# Patient Record
Sex: Male | Born: 1980 | Race: White | Hispanic: No | Marital: Single | Smoking: Never smoker
Health system: Southern US, Community
[De-identification: ages and names within clinical notes are randomized; demographics above are authoritative.]

## PROBLEM LIST (undated history)

## (undated) DIAGNOSIS — F909 Attention-deficit hyperactivity disorder, unspecified type: Secondary | ICD-10-CM

## (undated) DIAGNOSIS — E119 Type 2 diabetes mellitus without complications: Secondary | ICD-10-CM

## (undated) DIAGNOSIS — I10 Essential (primary) hypertension: Secondary | ICD-10-CM

## (undated) DIAGNOSIS — F419 Anxiety disorder, unspecified: Secondary | ICD-10-CM

## (undated) DIAGNOSIS — E785 Hyperlipidemia, unspecified: Secondary | ICD-10-CM

## (undated) DIAGNOSIS — R011 Cardiac murmur, unspecified: Secondary | ICD-10-CM

## (undated) DIAGNOSIS — F32A Depression, unspecified: Secondary | ICD-10-CM

## (undated) DIAGNOSIS — I739 Peripheral vascular disease, unspecified: Secondary | ICD-10-CM

## (undated) DIAGNOSIS — F319 Bipolar disorder, unspecified: Secondary | ICD-10-CM

## (undated) HISTORY — DX: Type 2 diabetes mellitus without complications: E11.9

## (undated) HISTORY — PX: EYE SURGERY: SHX253

## (undated) HISTORY — DX: Essential (primary) hypertension: I10

## (undated) HISTORY — PX: WISDOM TOOTH EXTRACTION: SHX21

## (undated) NOTE — *Deleted (*Deleted)
13 year old male comes in because of fever and chills for the last 36 hours.  He has been seeing a podiatrist because of ulcers on his toes and they have been periodically debrided with x-rays failing to show evidence of osteomyelitis.  On exam, he is febrile and tachycardic with borderline blood pressure.  Code sepsis is activated.  The left first toe is markedly enlarged and erythematous with a deep ulcer present on the plantar surface.  Right first toe is mildly erythematous and swollen.

---

## 2002-03-08 ENCOUNTER — Ambulatory Visit: Admission: RE | Admit: 2002-03-08 | Discharge: 2002-03-08 | Payer: Self-pay | Admitting: Family Medicine

## 2015-04-30 ENCOUNTER — Ambulatory Visit (INDEPENDENT_AMBULATORY_CARE_PROVIDER_SITE_OTHER): Payer: Self-pay | Admitting: Physician Assistant

## 2015-04-30 VITALS — BP 144/104 | HR 82 | Temp 98.2°F | Resp 18 | Ht 75.0 in | Wt 390.6 lb

## 2015-04-30 DIAGNOSIS — G4700 Insomnia, unspecified: Secondary | ICD-10-CM

## 2015-04-30 DIAGNOSIS — H9311 Tinnitus, right ear: Secondary | ICD-10-CM

## 2015-04-30 DIAGNOSIS — I10 Essential (primary) hypertension: Secondary | ICD-10-CM

## 2015-04-30 LAB — POCT URINALYSIS DIP (MANUAL ENTRY)
BILIRUBIN UA: NEGATIVE
Blood, UA: NEGATIVE
LEUKOCYTES UA: NEGATIVE
Nitrite, UA: NEGATIVE
Protein Ur, POC: NEGATIVE
SPEC GRAV UA: 1.02
Urobilinogen, UA: 0.2
pH, UA: 5.5

## 2015-04-30 MED ORDER — TRAZODONE HCL 50 MG PO TABS: 50.0000 mg | ORAL_TABLET | Freq: Every day | ORAL | Status: DC

## 2015-04-30 MED ORDER — HYDROCHLOROTHIAZIDE 12.5 MG PO TABS: 12.5000 mg | ORAL_TABLET | Freq: Every day | ORAL | Status: DC

## 2015-04-30 MED ORDER — OXYMETAZOLINE HCL 0.05 % NA SOLN: 2.0000 | Freq: Two times a day (BID) | NASAL | Status: AC

## 2015-04-30 NOTE — Patient Instructions (Addendum)
Take the afrin no more than 3 days.  Please take medications as prescribed. I would like you to file disability with the social services disability.  You may fail with this attempt, but then you need to reapply.  If you have questions with this, you may contact us as well.     Please decrease your soda intake, and drink more water. If you are finding that you are staying up more at night, and demonstrating different behaviors, let us know. I would like to see you in 4-6 weeks for follow up.   Please check you blood pressure 3 times per week.  Please bring your blood pressure readings with you to the next visit.   You can call ahead to see if I am here.   Tinnitus Tinnitus refers to hearing a sound when there is no actual source for that sound. This is often described as ringing in the ears. However, people with this condition may hear a variety of noises. A person may hear the sound in one ear or in both ears.  The sounds of tinnitus can be soft, loud, or somewhere in between. Tinnitus can last for a few seconds or can be constant for days. It may go away without treatment and come back at various times. When tinnitus is constant or happens often, it can lead to other problems, such as trouble sleeping and trouble concentrating. Almost everyone experiences tinnitus at some point. Tinnitus that is long-lasting (chronic) or comes back often is a problem that may require medical attention.  CAUSES  The cause of tinnitus is often not known. In some cases, it can result from other problems or conditions, including:   Exposure to loud noises from machinery, music, or other sources.  Hearing loss.  Ear or sinus infections.  Earwax buildup.  A foreign object in the ear.  Use of certain medicines.  Use of alcohol and caffeine.  High blood pressure.  Heart diseases.  Anemia.  Allergies.  Meniere disease.  Thyroid problems.  Tumors.  An enlarged part of a weakened blood vessel  (aneurysm). SYMPTOMS The main symptom of tinnitus is hearing a sound when there is no source for that sound. It may sound like:   Buzzing.  Roaring.  Ringing.  Blowing air, similar to the sound heard when you listen to a seashell.  Hissing.  Whistling.  Sizzling.  Humming.  Running water.  A sustained musical note. DIAGNOSIS  Tinnitus is diagnosed based on your symptoms. Your health care provider will do a physical exam. A comprehensive hearing exam (audiologic exam) will be done if your tinnitus:   Affects only one ear (unilateral).  Causes hearing difficulties.  Lasts 6 months or longer. You may also need to see a health care provider who specializes in hearing disorders (audiologist). You may be asked to complete a questionnaire to determine the severity of your tinnitus. Tests may be done to help determine the cause and to rule out other conditions. These can include:  Imaging studies of your head and brain, such as:  A CT scan.  An MRI.  An imaging study of your blood vessels (angiogram). TREATMENT  Treating an underlying medical condition can sometimes make tinnitus go away. If your tinnitus continues, other treatments may include:  Medicines, such as certain antidepressants or sleeping aids.  Sound generators to mask the tinnitus. These include:  Tabletop sound machines that play relaxing sounds to help you fall asleep.  Wearable devices that fit in your ear  and play sounds or music.  A small device that uses headphones to deliver a signal embedded in music (acoustic neural stimulation). In time, this may change the pathways of your brain and make you less sensitive to tinnitus. This device is used for very severe cases when no other treatment is working.  Therapy and counseling to help you manage the stress of living with tinnitus.  Using hearing aids or cochlear implants, if your tinnitus is related to hearing loss. HOME CARE INSTRUCTIONS  When  possible, avoid being in loud places and being exposed to loud sounds.  Wear hearing protection, such as earplugs, when you are exposed to loud noises.  Do not take stimulants, such as nicotine, alcohol, or caffeine.  Practice techniques for reducing stress, such as meditation, yoga, or deep breathing.  Use a white noise machine, a humidifier, or other devices to mask the sound of tinnitus.  Sleep with your head slightly raised. This may reduce the impact of tinnitus.  Try to get plenty of rest each night. SEEK MEDICAL CARE IF:  You have tinnitus in just one ear.  Your tinnitus continues for 3 weeks or longer without stopping.  Home care measures are not helping.  You have tinnitus after a head injury.  You have tinnitus along with any of the following:  Dizziness.  Loss of balance.  Nausea and vomiting.   This information is not intended to replace advice given to you by your health care provider. Make sure you discuss any questions you have with your health care provider.   Document Released: 05/11/2005 Document Revised: 06/01/2014 Document Reviewed: 10/11/2013 Elsevier Interactive Patient Education 2016 Elsevier Inc. DASH Eating Plan DASH stands for "Dietary Approaches to Stop Hypertension." The DASH eating plan is a healthy eating plan that has been shown to reduce high blood pressure (hypertension). Additional health benefits may include reducing the risk of type 2 diabetes mellitus, heart disease, and stroke. The DASH eating plan may also help with weight loss. WHAT DO I NEED TO KNOW ABOUT THE DASH EATING PLAN? For the DASH eating plan, you will follow these general guidelines:  Choose foods with a percent daily value for sodium of less than 5% (as listed on the food label).  Use salt-free seasonings or herbs instead of table salt or sea salt.  Check with your health care provider or pharmacist before using salt substitutes.  Eat lower-sodium products, often  labeled as "lower sodium" or "no salt added."  Eat fresh foods.  Eat more vegetables, fruits, and low-fat dairy products.  Choose whole grains. Look for the word "whole" as the first word in the ingredient list.  Choose fish and skinless chicken or [place] more often than red meat. Limit fish, poultry, and meat to 6 oz (170 g) each day.  Limit sweets, desserts, sugars, and sugary drinks.  Choose heart-healthy fats.  Limit cheese to 1 oz (28 g) per day.  Eat more home-cooked food and less restaurant, buffet, and fast food.  Limit fried foods.  Cook foods using methods other than frying.  Limit canned vegetables. If you do use them, rinse them well to decrease the sodium.  When eating at a restaurant, ask that your food be prepared with less salt, or no salt if possible. WHAT FOODS CAN I EAT? Seek help from a dietitian for individual calorie needs. Grains Whole grain or whole wheat bread. Brown rice. Whole grain or whole wheat pasta. Quinoa, bulgur, and whole grain cereals. Low-sodium cereals. Corn or  whole wheat flour tortillas. Whole grain cornbread. Whole grain crackers. Low-sodium crackers. Vegetables Fresh or frozen vegetables (raw, steamed, roasted, or grilled). Low-sodium or reduced-sodium tomato and vegetable juices. Low-sodium or reduced-sodium tomato sauce and paste. Low-sodium or reduced-sodium canned vegetables.  Fruits All fresh, canned (in natural juice), or frozen fruits. Meat and Other Protein Products Ground beef (85% or leaner), grass-fed beef, or beef trimmed of fat. Skinless chicken or [place]. Ground chicken or [place]. Pork trimmed of fat. All fish and seafood. Eggs. Dried beans, peas, or lentils. Unsalted nuts and seeds. Unsalted canned beans. Dairy Low-fat dairy products, such as skim or 1% milk, 2% or reduced-fat cheeses, low-fat ricotta or cottage cheese, or plain low-fat yogurt. Low-sodium or reduced-sodium cheeses. Fats and Oils Tub margarines without  trans fats. Light or reduced-fat mayonnaise and salad dressings (reduced sodium). Avocado. Safflower, olive, or canola oils. Natural peanut or almond butter. Other Unsalted popcorn and pretzels. The items listed above may not be a complete list of recommended foods or beverages. Contact your dietitian for more options. WHAT FOODS ARE NOT RECOMMENDED? Grains White bread. White pasta. White rice. Refined cornbread. Bagels and croissants. Crackers that contain trans fat. Vegetables Creamed or fried vegetables. Vegetables in a cheese sauce. Regular canned vegetables. Regular canned tomato sauce and paste. Regular tomato and vegetable juices. Fruits Dried fruits. Canned fruit in light or heavy syrup. Fruit juice. Meat and Other Protein Products Fatty cuts of meat. Ribs, chicken wings, bacon, sausage, bologna, salami, chitterlings, fatback, hot dogs, bratwurst, and packaged luncheon meats. Salted nuts and seeds. Canned beans with salt. Dairy Whole or 2% milk, cream, half-and-half, and cream cheese. Whole-fat or sweetened yogurt. Full-fat cheeses or blue cheese. Nondairy creamers and whipped toppings. Processed cheese, cheese spreads, or cheese curds. Condiments Onion and garlic salt, seasoned salt, table salt, and sea salt. Canned and packaged gravies. Worcestershire sauce. Tartar sauce. Barbecue sauce. Teriyaki sauce. Soy sauce, including reduced sodium. Steak sauce. Fish sauce. Oyster sauce. Cocktail sauce. Horseradish. Ketchup and mustard. Meat flavorings and tenderizers. Bouillon cubes. Hot sauce. Tabasco sauce. Marinades. Taco seasonings. Relishes. Fats and Oils Butter, stick margarine, lard, shortening, ghee, and bacon fat. Coconut, palm kernel, or palm oils. Regular salad dressings. Other Pickles and olives. Salted popcorn and pretzels. The items listed above may not be a complete list of foods and beverages to avoid. Contact your dietitian for more information. WHERE CAN I FIND MORE  INFORMATION? National Heart, Lung, and Blood Institute: [employer]   This information is not intended to replace advice given to you by your health care provider. Make sure you discuss any questions you have with your health care provider.   Document Released: 04/30/2011 Document Revised: 06/01/2014 Document Reviewed: 03/15/2013 Elsevier Interactive Patient Education [employer]

## 2015-04-30 NOTE — Progress Notes (Signed)
[facility] [address], [place] [id number] [phone]  Date:  04/30/2015   Name:  [name] CheneyRussell C Waltz   DOB:  06/10/1980   MRN:  [id number]  PCP:  No primary care provider on file.    History of Present Illness:  [name] is a 34 y.o. male patient who [facility] for cc of tinnitus 3 weeks.  Tooth hurting back and top are sensitive.  Right ear tinnitus high pitched buzzing, barely sleep.  It is louder as the environment is quiet.  It is constant.  Crackling with opening mouth.  Ear wax removal helped along with allergy medication.  No fever. No dysequilibrium.    Positive depression screening.  Currently unemployed and without insurance.  He states his depression is worse than it has been.  He states he has thought of suicide in the past, but will never act on this.  He notes agitation as well.  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  He has been employed but can not complete it due to pressure of the workplace.  On his 2 last employments, he has quit due to becoming angry with his supervisor, and what appears like their ridicule.  He states that he hates to fail.   School was difficult.  Would attempt to disappear in crowds to avoid being ridiculed.  He states that he had difficulty with school, and attentiveness.  Though he would read fantastical books during school, without lack of focus.   He has tried paxil, but it would not control his depression.  He states he had the best satisfaction from prozac.  However there was a combination of possibly this and adderall which sent him into a somewhat manic state. Currently lives with family.  He has some friends.  Hx of hypertension but no longer on medication.  There are no active problems to display for this patient.   History reviewed. No pertinent past medical history.  History reviewed. No pertinent past surgical history.   Social History  Substance Use Topics  . Smoking status: Never  Smoker   . Smokeless tobacco: None  . Alcohol Use: No    History reviewed. No pertinent family history.  No Known Allergies  Medication list has been reviewed and updated.  No current outpatient prescriptions on file prior to visit.   No current facility-administered medications on file prior to visit.    ROS ROS otherwise unremarkable unless listed above.  Physical Examination: BP 145/105 mmHg  Pulse 82  Temp(Src) 98.2 F (36.8 C) (Oral)  Resp 18  Ht 6\' 3"  (1.905 m)  Wt 390 lb 9.6 oz (177.175 kg)  BMI 48.82 kg/m2  SpO2 99% Ideal Body Weight: Weight in (lb) to have BMI = 25: 199.6  Physical Exam  Constitutional: He is oriented to person, place, and time. He appears well-developed and well-nourished. No distress.  HENT:  Head: Normocephalic and atraumatic.  Right Ear: Tympanic membrane, external ear and ear canal normal.  Left Ear: Tympanic membrane, external ear and ear canal normal.  Nose: No mucosal edema or rhinorrhea.  Mouth/Throat: No oropharyngeal exudate, posterior oropharyngeal edema or posterior oropharyngeal erythema.  Ac>bc.  Without lateralization.  Eyes: Conjunctivae and EOM are normal. Pupils are equal, round, and reactive to light.  Cardiovascular: Normal rate and regular rhythm.  Exam reveals no friction rub.   No murmur heard. Pulmonary/Chest: Effort normal. No respiratory distress.  Neurological: He is alert and oriented to person, place,  and time.  Skin: Skin is warm and dry. He is not diaphoretic.  Psychiatric: He has a normal mood and affect. His behavior is normal.   Results for orders placed or performed in visit on 04/30/15  POCT urinalysis dipstick  Result Value Ref Range   Color, UA yellow yellow   Clarity, UA clear clear   Glucose, UA =500 (A) negative   Bilirubin, UA negative negative   Ketones, POC UA trace (5) (A) negative   Spec Grav, UA 1.020    Blood, UA negative negative   pH, UA 5.5    Protein Ur, POC negative negative    Urobilinogen, UA 0.2    Nitrite, UA Negative Negative   Leukocytes, UA Negative Negative     Assessment and Plan: [name] is a 34 y.o. male who is here today for tinnitus, depression, and here with high BP.   Treating with afrin to clear the ear passage.  Discussed firmly with adverse effect briefed, to only take the afrin for 3 days. Starting on trazadone at this time, to help with sleep with tinnitus, and depression.   Advised that he proceed with disability filing.  He may need ent specialist. Starting on hctz.  Advised that he hydrate with water well.  Given dash diet.    Essential hypertension - Plan: POCT urinalysis dipstick, hydrochlorothiazide (HYDRODIURIL) 12.5 MG tablet  Tinnitus, right - Plan: oxymetazoline (AFRIN) 0.05 % nasal spray, traZODone (DESYREL) 50 MG tablet  Insomnia - Plan: traZODone (DESYREL) 50 MG tablet   [name], PA-C [facility] 04/30/2015 11:16 AM  Addendum: Urine was placed upon his exit.  I have advised to contact patient for him to return for a1c and cmet.

## 2015-05-03 ENCOUNTER — Telehealth: Payer: Self-pay | Admitting: Physician Assistant

## 2015-05-03 ENCOUNTER — Telehealth: Payer: Self-pay

## 2015-05-03 DIAGNOSIS — R81 Glycosuria: Secondary | ICD-10-CM

## 2015-05-03 NOTE — Telephone Encounter (Signed)
Pt.notified

## 2015-05-03 NOTE — Telephone Encounter (Signed)
Unable to reach Pt. Left VM to call back 

## 2015-05-03 NOTE — Telephone Encounter (Signed)
Please alert the patient that his urine had glucose.  Is it possible that he can return for blood work.  This will be an a1c and a cmet.  There is concern that their is diabetes.  I would like to check this.  I will place a future order.  All he has to do is say he is here for blood work. Please also advise that he needs to go ahead and file for disability.

## 2016-06-04 ENCOUNTER — Ambulatory Visit (INDEPENDENT_AMBULATORY_CARE_PROVIDER_SITE_OTHER): Payer: Self-pay | Admitting: Emergency Medicine

## 2016-06-04 VITALS — BP 130/80 | HR 97 | Temp 98.3°F | Ht 75.25 in | Wt 370.2 lb

## 2016-06-04 DIAGNOSIS — R350 Frequency of micturition: Secondary | ICD-10-CM

## 2016-06-04 DIAGNOSIS — B999 Unspecified infectious disease: Secondary | ICD-10-CM

## 2016-06-04 DIAGNOSIS — N3000 Acute cystitis without hematuria: Secondary | ICD-10-CM

## 2016-06-04 DIAGNOSIS — R300 Dysuria: Secondary | ICD-10-CM

## 2016-06-04 DIAGNOSIS — R358 Other polyuria: Secondary | ICD-10-CM

## 2016-06-04 DIAGNOSIS — Z6841 Body Mass Index (BMI) 40.0 and over, adult: Secondary | ICD-10-CM

## 2016-06-04 LAB — POCT URINALYSIS DIP (MANUAL ENTRY)
Bilirubin, UA: NEGATIVE
Blood, UA: NEGATIVE
Glucose, UA: 500 — AB
Leukocytes, UA: NEGATIVE
Nitrite, UA: NEGATIVE
PH UA: 6.5
PROTEIN UA: NEGATIVE
SPEC GRAV UA: 1.01
Urobilinogen, UA: 0.2

## 2016-06-04 MED ORDER — CIPROFLOXACIN HCL 500 MG PO TABS: 500.0000 mg | ORAL_TABLET | Freq: Two times a day (BID) | ORAL | 0 refills | Status: AC

## 2016-06-04 NOTE — Patient Instructions (Addendum)
IF you received an x-ray today, you will receive an invoice from [facility]. Please contact [facility] at [phone] with questions or concerns regarding your invoice.   IF you received labwork today, you will receive an invoice from [facility]. Please contact [facility] at [phone] with questions or concerns regarding your invoice.   Our billing staff will not be able to assist you with questions regarding bills from these companies.  You will be contacted with the lab results as soon as they are available. The fastest way to get your results is to activate your My Chart account. Instructions are located on the last page of this paperwork. If you have not heard from [name] regarding the results in 2 weeks, please contact this office.         Urinary Tract Infection, Adult A urinary tract infection (UTI) is an infection of any part of the urinary tract, which includes the kidneys, ureters, bladder, and urethra. These organs make, store, and get rid of urine in the body. UTI can be a bladder infection (cystitis) or kidney infection (pyelonephritis). What are the causes? This infection may be caused by fungi, viruses, or bacteria. Bacteria are the most common cause of UTIs. This condition can also be caused by repeated incomplete emptying of the bladder during urination. What increases the risk? This condition is more likely to develop if:  You ignore your need to urinate or hold urine for long periods of time.  You do not empty your bladder completely during urination.  You wipe back to front after urinating or having a bowel movement, if you are male.  You are uncircumcised, if you are male.  You are constipated.  You have a urinary catheter that stays in place (indwelling).  You have a weak defense (immune) system.  You have a medical condition that affects your bowels, kidneys, or bladder.  You have diabetes.  You take antibiotic medicines  frequently or for long periods of time, and the antibiotics no longer work well against certain types of infections (antibiotic resistance).  You take medicines that irritate your urinary tract.  You are exposed to chemicals that irritate your urinary tract.  You are male. What are the signs or symptoms? Symptoms of this condition include:  Fever.  Frequent urination or passing small amounts of urine frequently.  Needing to urinate urgently.  Pain or burning with urination.  Urine that smells bad or unusual.  Cloudy urine.  Pain in the lower abdomen or back.  Trouble urinating.  Blood in the urine.  Vomiting or being less hungry than normal.  Diarrhea or abdominal pain.  Vaginal discharge, if you are male. How is this diagnosed? This condition is diagnosed with a medical history and physical exam. You will also need to provide a urine sample to test your urine. Other tests may be done, including:  Blood tests.  Sexually transmitted disease (STD) testing. If you have had more than one UTI, a cystoscopy or imaging studies may be done to determine the cause of the infections. How is this treated? Treatment for this condition often includes a combination of two or more of the following:  Antibiotic medicine.  Other medicines to treat less common causes of UTI.  Over-the-counter medicines to treat pain.  Drinking enough water to stay hydrated. Follow these instructions at home:  Take over-the-counter and prescription medicines only as told by your health care provider.  If you were prescribed an antibiotic, take it as told  by your health care provider. Do not stop taking the antibiotic even if you start to feel better.  Avoid alcohol, caffeine, tea, and carbonated beverages. They can irritate your bladder.  Drink enough fluid to keep your urine clear or pale yellow.  Keep all follow-up visits as told by your health care provider. This is important.  Make  sure to:  Empty your bladder often and completely. Do not hold urine for long periods of time.  Empty your bladder before and after sex.  Wipe from front to back after a bowel movement if you are male. Use each tissue one time when you wipe. Contact a health care provider if:  You have back pain.  You have a fever.  You feel nauseous or vomit.  Your symptoms do not get better after 3 days.  Your symptoms go away and then return. Get help right away if:  You have severe back pain or lower abdominal pain.  You are vomiting and cannot keep down any medicines or water. This information is not intended to replace advice given to you by your health care provider. Make sure you discuss any questions you have with your health care provider. Document Released: 02/18/2005 Document Revised: 10/23/2015 Document Reviewed: 04/01/2015 Elsevier Interactive Patient Education  2017 [employer]  Calorie Counting for [employer] Calories are energy you get from the things you eat and drink. Your body uses this energy to keep you going throughout the day. The number of calories you eat affects your weight. When you eat more calories than your body needs, your body stores the extra calories as fat. When you eat fewer calories than your body needs, your body burns fat to get the energy it needs. Calorie counting means keeping track of how many calories you eat and drink each day. If you make sure to eat fewer calories than your body needs, you should lose weight. In order for calorie counting to work, you will need to eat the number of calories that are right for you in a day to lose a healthy amount of weight per week. A healthy amount of weight to lose per week is usually 1-2 lb (0.5-0.9 kg). A dietitian can determine how many calories you need in a day and give you suggestions on how to reach your calorie goal.  WHAT IS MY MY PLAN? My goal is to have __________ calories per day.  If I have this many  calories per day, I should lose around __________ pounds per week. WHAT DO I NEED TO KNOW ABOUT CALORIE COUNTING? In order to meet your daily calorie goal, you will need to:  Find out how many calories are in each food you would like to eat. Try to do this before you eat.  Decide how much of the food you can eat.  Write down what you ate and how many calories it had. Doing this is called keeping a food log. WHERE DO I FIND CALORIE INFORMATION? The number of calories in a food can be found on a Nutrition Facts label. Note that all the information on a label is based on a specific serving of the food. If a food does not have a Nutrition Facts label, try to look up the calories online or ask your dietitian for help. HOW DO I DECIDE HOW MUCH TO EAT? To decide how much of the food you can eat, you will need to consider both the number of calories in one serving and the size  of one serving. This information can be found on the Nutrition Facts label. If a food does not have a Nutrition Facts label, look up the information online or ask your dietitian for help. Remember that calories are listed per serving. If you choose to have more than one serving of a food, you will have to multiply the calories per serving by the amount of servings you plan to eat. For example, the label on a package of bread might say that a serving size is 1 slice and that there are 90 calories in a serving. If you eat 1 slice, you will have eaten 90 calories. If you eat 2 slices, you will have eaten 180 calories. HOW DO I KEEP A FOOD LOG? After each meal, record the following information in your food log:  What you ate.  How much of it you ate.  How many calories it had.  Then, add up your calories. Keep your food log near you, such as in a small notebook in your pocket. Another option is to use a mobile app or website. Some programs will calculate calories for you and show you how many calories you have left each time you  add an item to the log. WHAT ARE SOME CALORIE COUNTING TIPS?  Use your calories on foods and drinks that will fill you up and not leave you hungry. Some examples of this include foods like nuts and nut butters, vegetables, lean proteins, and high-fiber foods (more than 5 g fiber per serving).  Eat nutritious foods and avoid empty calories. Empty calories are calories you get from foods or beverages that do not have many nutrients, such as candy and soda. It is better to have a nutritious high-calorie food (such as an avocado) than a food with few nutrients (such as a bag of chips).  Know how many calories are in the foods you eat most often. This way, you do not have to look up how many calories they have each time you eat them.  Look out for foods that may seem like low-calorie foods but are really high-calorie foods, such as baked goods, soda, and fat-free candy.  Pay attention to calories in drinks. Drinks such as sodas, specialty coffee drinks, alcohol, and juices have a lot of calories yet do not fill you up. Choose low-calorie drinks like water and diet drinks.  Focus your calorie counting efforts on higher calorie items. Logging the calories in a garden salad that contains only vegetables is less important than calculating the calories in a milk shake.  Find a way of tracking calories that works for you. Get creative. Most people who are successful find ways to keep track of how much they eat in a day, even if they do not count every calorie. WHAT ARE SOME PORTION CONTROL TIPS?  Know how many calories are in a serving. This will help you know how many servings of a certain food you can have.  Use a measuring cup to measure serving sizes. This is helpful when you start out. With time, you will be able to estimate serving sizes for some foods.  Take some time to put servings of different foods on your favorite plates, bowls, and cups so you know what a serving looks like.  Try not to eat  straight from a bag or box. Doing this can lead to overeating. Put the amount you would like to eat in a cup or on a plate to make sure you are eating  the right portion.  Use smaller plates, glasses, and bowls to prevent overeating. This is a quick and easy way to practice portion control. If your plate is smaller, less food can fit on it.  Try not to multitask while eating, such as watching TV or using your computer. If it is time to eat, sit down at a table and enjoy your food. Doing this will help you to start recognizing when you are full. It will also make you more aware of what and how much you are eating. HOW CAN I CALORIE COUNT WHEN EATING OUT?  Ask for smaller portion sizes or child-sized portions.  Consider sharing an entree and sides instead of getting your own entree.  If you get your own entree, eat only half. Ask for a box at the beginning of your meal and put the rest of your entree in it so you are not tempted to eat it.  Look for the calories on the menu. If calories are listed, choose the lower calorie options.  Choose dishes that include vegetables, fruits, whole grains, low-fat dairy products, and lean protein. Focusing on smart food choices from each of the 5 food groups can help you stay on track at restaurants.  Choose items that are boiled, broiled, grilled, or steamed.  Choose water, milk, unsweetened iced tea, or other drinks without added sugars. If you want an alcoholic beverage, choose a lower calorie option. For example, a regular margarita can have up to 700 calories and a glass of wine has around 150.  Stay away from items that are buttered, battered, fried, or served with cream sauce. Items labeled "crispy" are usually fried, unless stated otherwise.  Ask for dressings, sauces, and syrups on the side. These are usually very high in calories, so do not eat much of them.  Watch out for salads. Many people think salads are a healthy option, but this is often not  the case. Many salads come with bacon, fried chicken, lots of cheese, fried chips, and dressing. All of these items have a lot of calories. If you want a salad, choose a garden salad and ask for grilled meats or steak. Ask for the dressing on the side, or ask for olive oil and vinegar or lemon to use as dressing.  Estimate how many servings of a food you are given. For example, a serving of cooked rice is  cup or about the size of half a tennis ball or one cupcake wrapper. Knowing serving sizes will help you be aware of how much food you are eating at restaurants. The list below tells you how big or small some common portion sizes are based on everyday objects.  1 oz-4 stacked dice.  3 oz-1 deck of cards.  1 tsp-1 dice.  1 Tbsp- a Ping-Pong ball.  2 Tbsp-1 Ping-Pong ball.   cup-1 tennis ball or 1 cupcake wrapper.  1 cup-1 baseball. This information is not intended to replace advice given to you by your health care provider. Make sure you discuss any questions you have with your health care provider. Document Released: 05/11/2005 Document Revised: 06/01/2014 Document Reviewed: 03/16/2013 Elsevier Interactive Patient Education  2017 [employer].

## 2016-06-04 NOTE — Progress Notes (Addendum)
PCP:No primary care provider on file. Chief Complaint  Patient presents with  . Dysuria    X 2-3 days with lower back wit urination pain    Current Issues:  Presents with 2 days of dysuria Associated symptoms include:  dysuria and urinary frequency  There is a previous history of of similar symptoms. Sexually active:  No   No concern for STI.  Prior to Admission medications   Medication Sig Start Date End Date Taking? Authorizing Provider  hydrochlorothiazide (HYDRODIURIL) 12.5 MG tablet Take 1 tablet (12.5 mg total) by mouth daily. Patient not taking: Reported on 06/04/2016 04/30/15   Collie Siad English, PA  traZODone (DESYREL) 50 MG tablet Take 1 tablet (50 mg total) by mouth at bedtime. Patient not taking: Reported on 06/04/2016 04/30/15   Collie Siad English, PA    Review of Systems:   PE:  BP 130/80 (BP Location: Right Arm, Patient Position: Sitting, Cuff Size: Large)   Pulse 97   Temp 98.3 F (36.8 C) (Oral)   Ht 6' 3.25" (1.911 m)   Wt (!) 370 lb 3.2 oz (167.9 kg)   SpO2 98%   BMI 45.96 kg/m  Constitutional:negative Heart:negative   Lungsnegative Back: +lumbar pain Abdomen:negative Pelvicnegative   Results for orders placed or performed in visit on 04/30/15  POCT urinalysis dipstick  Result Value Ref Range   Color, UA yellow yellow   Clarity, UA clear clear   Glucose, UA =500 (A) negative   Bilirubin, UA negative negative   Ketones, POC UA trace (5) (A) negative   Spec Grav, UA 1.020    Blood, UA negative negative   pH, UA 5.5    Protein Ur, POC negative negative   Urobilinogen, UA 0.2    Nitrite, UA Negative Negative   Leukocytes, UA Negative Negative   Vitals:   06/04/16 1008  BP: 130/80  Pulse: 97  Temp: 98.3 F (36.8 C)    Assessment and Plan:  1. Dysuria [name] was seen today for dysuria.  Diagnoses and all orders for this visit:  Dysuria -     POCT urinalysis dipstick -     Urine culture -     CBC with Differential/Platelet -      Comprehensive metabolic panel -     Hemoglobin A1c -     Care order/instruction:  Acute cystitis without hematuria  Frequency of urination and polyuria  Body mass index (BMI) of 45.0-49.9 in adult (HCC) -     Amb ref to Medical Nutrition Therapy-MNT  Other orders -     ciprofloxacin (CIPRO) 500 MG tablet; Take 1 tablet (500 mg total) by mouth 2 (two) times daily.    Patient Instructions       IF you received an x-ray today, you will receive an invoice from [facility]. Please contact [facility] at [phone] with questions or concerns regarding your invoice.   IF you received labwork today, you will receive an invoice from [facility]. Please contact [facility] at [phone] with questions or concerns regarding your invoice.   Our billing staff will not be able to assist you with questions regarding bills from these companies.  You will be contacted with the lab results as soon as they are available. The fastest way to get your results is to activate your My Chart account. Instructions are located on the last page of this paperwork. If you have not heard from [name] regarding the results in 2 weeks, please contact this office.  Urinary Tract Infection, Adult A urinary tract infection (UTI) is an infection of any part of the urinary tract, which includes the kidneys, ureters, bladder, and urethra. These organs make, store, and get rid of urine in the body. UTI can be a bladder infection (cystitis) or kidney infection (pyelonephritis). What are the causes? This infection may be caused by fungi, viruses, or bacteria. Bacteria are the most common cause of UTIs. This condition can also be caused by repeated incomplete emptying of the bladder during urination. What increases the risk? This condition is more likely to develop if:  You ignore your need to urinate or hold urine for long periods of time.  You do not empty your bladder completely during  urination.  You wipe back to front after urinating or having a bowel movement, if you are male.  You are uncircumcised, if you are male.  You are constipated.  You have a urinary catheter that stays in place (indwelling).  You have a weak defense (immune) system.  You have a medical condition that affects your bowels, kidneys, or bladder.  You have diabetes.  You take antibiotic medicines frequently or for long periods of time, and the antibiotics no longer work well against certain types of infections (antibiotic resistance).  You take medicines that irritate your urinary tract.  You are exposed to chemicals that irritate your urinary tract.  You are male. What are the signs or symptoms? Symptoms of this condition include:  Fever.  Frequent urination or passing small amounts of urine frequently.  Needing to urinate urgently.  Pain or burning with urination.  Urine that smells bad or unusual.  Cloudy urine.  Pain in the lower abdomen or back.  Trouble urinating.  Blood in the urine.  Vomiting or being less hungry than normal.  Diarrhea or abdominal pain.  Vaginal discharge, if you are male. How is this diagnosed? This condition is diagnosed with a medical history and physical exam. You will also need to provide a urine sample to test your urine. Other tests may be done, including:  Blood tests.  Sexually transmitted disease (STD) testing. If you have had more than one UTI, a cystoscopy or imaging studies may be done to determine the cause of the infections. How is this treated? Treatment for this condition often includes a combination of two or more of the following:  Antibiotic medicine.  Other medicines to treat less common causes of UTI.  Over-the-counter medicines to treat pain.  Drinking enough water to stay hydrated. Follow these instructions at home:  Take over-the-counter and prescription medicines only as told by your health care  provider.  If you were prescribed an antibiotic, take it as told by your health care provider. Do not stop taking the antibiotic even if you start to feel better.  Avoid alcohol, caffeine, tea, and carbonated beverages. They can irritate your bladder.  Drink enough fluid to keep your urine clear or pale yellow.  Keep all follow-up visits as told by your health care provider. This is important.  Make sure to:  Empty your bladder often and completely. Do not hold urine for long periods of time.  Empty your bladder before and after sex.  Wipe from front to back after a bowel movement if you are male. Use each tissue one time when you wipe. Contact a health care provider if:  You have back pain.  You have a fever.  You feel nauseous or vomit.  Your symptoms do not get better after  3 days.  Your symptoms go away and then return. Get help right away if:  You have severe back pain or lower abdominal pain.  You are vomiting and cannot keep down any medicines or water. This information is not intended to replace advice given to you by your health care provider. Make sure you discuss any questions you have with your health care provider. Document Released: 02/18/2005 Document Revised: 10/23/2015 Document Reviewed: 04/01/2015 Elsevier Interactive Patient Education  2017 [employer]  Calorie Counting for [employer] Calories are energy you get from the things you eat and drink. Your body uses this energy to keep you going throughout the day. The number of calories you eat affects your weight. When you eat more calories than your body needs, your body stores the extra calories as fat. When you eat fewer calories than your body needs, your body burns fat to get the energy it needs. Calorie counting means keeping track of how many calories you eat and drink each day. If you make sure to eat fewer calories than your body needs, you should lose weight. In order for calorie counting to  work, you will need to eat the number of calories that are right for you in a day to lose a healthy amount of weight per week. A healthy amount of weight to lose per week is usually 1-2 lb (0.5-0.9 kg). A dietitian can determine how many calories you need in a day and give you suggestions on how to reach your calorie goal.  WHAT IS MY MY PLAN? My goal is to have __________ calories per day.  If I have this many calories per day, I should lose around __________ pounds per week. WHAT DO I NEED TO KNOW ABOUT CALORIE COUNTING? In order to meet your daily calorie goal, you will need to:  Find out how many calories are in each food you would like to eat. Try to do this before you eat.  Decide how much of the food you can eat.  Write down what you ate and how many calories it had. Doing this is called keeping a food log. WHERE DO I FIND CALORIE INFORMATION? The number of calories in a food can be found on a Nutrition Facts label. Note that all the information on a label is based on a specific serving of the food. If a food does not have a Nutrition Facts label, try to look up the calories online or ask your dietitian for help. HOW DO I DECIDE HOW MUCH TO EAT? To decide how much of the food you can eat, you will need to consider both the number of calories in one serving and the size of one serving. This information can be found on the Nutrition Facts label. If a food does not have a Nutrition Facts label, look up the information online or ask your dietitian for help. Remember that calories are listed per serving. If you choose to have more than one serving of a food, you will have to multiply the calories per serving by the amount of servings you plan to eat. For example, the label on a package of bread might say that a serving size is 1 slice and that there are 90 calories in a serving. If you eat 1 slice, you will have eaten 90 calories. If you eat 2 slices, you will have eaten 180 calories. HOW DO I  KEEP A FOOD LOG? After each meal, record the following information in your food log:  What you ate.  How much of it you ate.  How many calories it had.  Then, add up your calories. Keep your food log near you, such as in a small notebook in your pocket. Another option is to use a mobile app or website. Some programs will calculate calories for you and show you how many calories you have left each time you add an item to the log. WHAT ARE SOME CALORIE COUNTING TIPS?  Use your calories on foods and drinks that will fill you up and not leave you hungry. Some examples of this include foods like nuts and nut butters, vegetables, lean proteins, and high-fiber foods (more than 5 g fiber per serving).  Eat nutritious foods and avoid empty calories. Empty calories are calories you get from foods or beverages that do not have many nutrients, such as candy and soda. It is better to have a nutritious high-calorie food (such as an avocado) than a food with few nutrients (such as a bag of chips).  Know how many calories are in the foods you eat most often. This way, you do not have to look up how many calories they have each time you eat them.  Look out for foods that may seem like low-calorie foods but are really high-calorie foods, such as baked goods, soda, and fat-free candy.  Pay attention to calories in drinks. Drinks such as sodas, specialty coffee drinks, alcohol, and juices have a lot of calories yet do not fill you up. Choose low-calorie drinks like water and diet drinks.  Focus your calorie counting efforts on higher calorie items. Logging the calories in a garden salad that contains only vegetables is less important than calculating the calories in a milk shake.  Find a way of tracking calories that works for you. Get creative. Most people who are successful find ways to keep track of how much they eat in a day, even if they do not count every calorie. WHAT ARE SOME PORTION CONTROL  TIPS?  Know how many calories are in a serving. This will help you know how many servings of a certain food you can have.  Use a measuring cup to measure serving sizes. This is helpful when you start out. With time, you will be able to estimate serving sizes for some foods.  Take some time to put servings of different foods on your favorite plates, bowls, and cups so you know what a serving looks like.  Try not to eat straight from a bag or box. Doing this can lead to overeating. Put the amount you would like to eat in a cup or on a plate to make sure you are eating the right portion.  Use smaller plates, glasses, and bowls to prevent overeating. This is a quick and easy way to practice portion control. If your plate is smaller, less food can fit on it.  Try not to multitask while eating, such as watching TV or using your computer. If it is time to eat, sit down at a table and enjoy your food. Doing this will help you to start recognizing when you are full. It will also make you more aware of what and how much you are eating. HOW CAN I CALORIE COUNT WHEN EATING OUT?  Ask for smaller portion sizes or child-sized portions.  Consider sharing an entree and sides instead of getting your own entree.  If you get your own entree, eat only half. Ask for a box at the beginning of  your meal and put the rest of your entree in it so you are not tempted to eat it.  Look for the calories on the menu. If calories are listed, choose the lower calorie options.  Choose dishes that include vegetables, fruits, whole grains, low-fat dairy products, and lean protein. Focusing on smart food choices from each of the 5 food groups can help you stay on track at restaurants.  Choose items that are boiled, broiled, grilled, or steamed.  Choose water, milk, unsweetened iced tea, or other drinks without added sugars. If you want an alcoholic beverage, choose a lower calorie option. For example, a regular margarita can  have up to 700 calories and a glass of wine has around 150.  Stay away from items that are buttered, battered, fried, or served with cream sauce. Items labeled "crispy" are usually fried, unless stated otherwise.  Ask for dressings, sauces, and syrups on the side. These are usually very high in calories, so do not eat much of them.  Watch out for salads. Many people think salads are a healthy option, but this is often not the case. Many salads come with bacon, fried chicken, lots of cheese, fried chips, and dressing. All of these items have a lot of calories. If you want a salad, choose a garden salad and ask for grilled meats or steak. Ask for the dressing on the side, or ask for olive oil and vinegar or lemon to use as dressing.  Estimate how many servings of a food you are given. For example, a serving of cooked rice is  cup or about the size of half a tennis ball or one cupcake wrapper. Knowing serving sizes will help you be aware of how much food you are eating at restaurants. The list below tells you how big or small some common portion sizes are based on everyday objects.  1 oz-4 stacked dice.  3 oz-1 deck of cards.  1 tsp-1 dice.  1 Tbsp- a Ping-Pong ball.  2 Tbsp-1 Ping-Pong ball.   cup-1 tennis ball or 1 cupcake wrapper.  1 cup-1 baseball. This information is not intended to replace advice given to you by your health care provider. Make sure you discuss any questions you have with your health care provider. Document Released: 05/11/2005 Document Revised: 06/01/2014 Document Reviewed: 03/16/2013 Elsevier Interactive Patient Education  2017 Else[employer] Inc.   CMP shows hyperglycemia with elevated AIC. Will start Metformin.  - POCT urinalysis dipstick - Urine culture - CBC with Differential/Platelet - Comprehensive metabolic panel - Hemoglobin A1c

## 2016-06-05 LAB — CBC WITH DIFFERENTIAL/PLATELET
Basophils Absolute: 0 10*3/uL (ref 0.0–0.2)
Basos: 0 %
EOS (ABSOLUTE): 0.1 10*3/uL (ref 0.0–0.4)
EOS: 1 %
HEMATOCRIT: 45.8 % (ref 37.5–51.0)
Hemoglobin: 15 g/dL (ref 13.0–17.7)
IMMATURE GRANULOCYTES: 0 %
Immature Grans (Abs): 0 10*3/uL (ref 0.0–0.1)
Lymphocytes Absolute: 3.3 10*3/uL — ABNORMAL HIGH (ref 0.7–3.1)
Lymphs: 30 %
MCH: 27.3 pg (ref 26.6–33.0)
MCHC: 32.8 g/dL (ref 31.5–35.7)
MCV: 83 fL (ref 79–97)
MONOS ABS: 0.6 10*3/uL (ref 0.1–0.9)
Monocytes: 5 %
NEUTROS ABS: 7.1 10*3/uL — AB (ref 1.4–7.0)
Neutrophils: 64 %
PLATELETS: 254 10*3/uL (ref 150–379)
RBC: 5.49 x10E6/uL (ref 4.14–5.80)
RDW: 14.1 % (ref 12.3–15.4)
WBC: 11.2 10*3/uL — ABNORMAL HIGH (ref 3.4–10.8)

## 2016-06-05 LAB — COMPREHENSIVE METABOLIC PANEL
A/G RATIO: 1.3 (ref 1.2–2.2)
ALT: 19 IU/L (ref 0–44)
AST: 20 IU/L (ref 0–40)
Albumin: 4.3 g/dL (ref 3.5–5.5)
Alkaline Phosphatase: 112 IU/L (ref 39–117)
BILIRUBIN TOTAL: 0.4 mg/dL (ref 0.0–1.2)
BUN/Creatinine Ratio: 12 (ref 9–20)
BUN: 9 mg/dL (ref 6–20)
CHLORIDE: 91 mmol/L — AB (ref 96–106)
CO2: 23 mmol/L (ref 18–29)
Calcium: 9.2 mg/dL (ref 8.7–10.2)
Creatinine, Ser: 0.74 mg/dL — ABNORMAL LOW (ref 0.76–1.27)
GFR, EST AFRICAN AMERICAN: 136 mL/min/{1.73_m2} (ref >59)
GFR, EST NON AFRICAN AMERICAN: 118 mL/min/{1.73_m2} (ref >59)
GLOBULIN, TOTAL: 3.2 g/dL (ref 1.5–4.5)
Glucose: 381 mg/dL — ABNORMAL HIGH (ref 65–99)
POTASSIUM: 4.4 mmol/L (ref 3.5–5.2)
SODIUM: 131 mmol/L — AB (ref 134–144)
TOTAL PROTEIN: 7.5 g/dL (ref 6.0–8.5)

## 2016-06-05 LAB — HEMOGLOBIN A1C
Est. average glucose Bld gHb Est-mCnc: 286 mg/dL
Hgb A1c MFr Bld: 11.6 % — ABNORMAL HIGH (ref 4.8–5.6)

## 2016-06-05 MED ORDER — METFORMIN HCL 1000 MG PO TABS: 1000.0000 mg | ORAL_TABLET | Freq: Two times a day (BID) | ORAL | 3 refills | Status: DC

## 2016-06-05 NOTE — Addendum Note (Signed)
Addended by: Evie LacksSAGARDIA, Renwick Asman J on: 06/05/2016 08:12 AM   Modules accepted: Orders

## 2016-06-06 LAB — URINE CULTURE: Organism ID, Bacteria: NO GROWTH

## 2016-08-10 ENCOUNTER — Ambulatory Visit (INDEPENDENT_AMBULATORY_CARE_PROVIDER_SITE_OTHER): Payer: Self-pay | Admitting: Urgent Care

## 2016-08-10 VITALS — BP 160/104 | HR 91 | Temp 97.9°F | Resp 18 | Ht 75.25 in | Wt 388.2 lb

## 2016-08-10 DIAGNOSIS — M545 Low back pain, unspecified: Secondary | ICD-10-CM

## 2016-08-10 DIAGNOSIS — R030 Elevated blood-pressure reading, without diagnosis of hypertension: Secondary | ICD-10-CM

## 2016-08-10 DIAGNOSIS — R350 Frequency of micturition: Secondary | ICD-10-CM

## 2016-08-10 DIAGNOSIS — E1165 Type 2 diabetes mellitus with hyperglycemia: Secondary | ICD-10-CM

## 2016-08-10 DIAGNOSIS — I10 Essential (primary) hypertension: Secondary | ICD-10-CM

## 2016-08-10 DIAGNOSIS — IMO0001 Reserved for inherently not codable concepts without codable children: Secondary | ICD-10-CM

## 2016-08-10 LAB — POCT URINALYSIS DIP (MANUAL ENTRY)
Bilirubin, UA: NEGATIVE
Ketones, POC UA: NEGATIVE
Leukocytes, UA: NEGATIVE
Nitrite, UA: NEGATIVE
PROTEIN UA: NEGATIVE
RBC UA: NEGATIVE
Spec Grav, UA: 1.02 (ref 1.030–1.035)
UROBILINOGEN UA: 0.2 (ref ?–2.0)
pH, UA: 6 (ref 5.0–8.0)

## 2016-08-10 LAB — POCT GLYCOSYLATED HEMOGLOBIN (HGB A1C): HEMOGLOBIN A1C: 10.3

## 2016-08-10 LAB — POCT CBC
GRANULOCYTE PERCENT: 59 % (ref 37–80)
HCT, POC: 43.2 % — AB (ref 43.5–53.7)
HEMOGLOBIN: 15.5 g/dL (ref 14.1–18.1)
Lymph, poc: 3 (ref 0.6–3.4)
MCH: 28.8 pg (ref 27–31.2)
MCHC: 35.8 g/dL — AB (ref 31.8–35.4)
MCV: 80.5 fL (ref 80–97)
MID (CBC): 0.8 (ref 0–0.9)
MPV: 7.6 fL (ref 0–99.8)
POC Granulocyte: 5.4 (ref 2–6.9)
POC LYMPH PERCENT: 32.7 %L (ref 10–50)
POC MID %: 8.3 %M (ref 0–12)
Platelet Count, POC: 256 10*3/uL (ref 142–424)
RBC: 5.36 M/uL (ref 4.69–6.13)
RDW, POC: 13.4 %
WBC: 9.2 10*3/uL (ref 4.6–10.2)

## 2016-08-10 LAB — POC MICROSCOPIC URINALYSIS (UMFC): Mucus: ABSENT

## 2016-08-10 LAB — GLUCOSE, POCT (MANUAL RESULT ENTRY): POC GLUCOSE: 241 mg/dL — AB (ref 70–99)

## 2016-08-10 MED ORDER — LISINOPRIL-HYDROCHLOROTHIAZIDE 10-12.5 MG PO TABS: 1.0000 | ORAL_TABLET | Freq: Every day | ORAL | 3 refills | Status: DC

## 2016-08-10 MED ORDER — GLIPIZIDE 5 MG PO TABS: 5.0000 mg | ORAL_TABLET | Freq: Every day | ORAL | 3 refills | Status: DC

## 2016-08-10 NOTE — Patient Instructions (Addendum)
Diabetes Mellitus and Food It is important for you to manage your blood sugar (glucose) level. Your blood glucose level can be greatly affected by what you eat. Eating healthier foods in the appropriate amounts throughout the day at about the same time each day will help you control your blood glucose level. It can also help slow or prevent worsening of your diabetes mellitus. Healthy eating may even help you improve the level of your blood pressure and reach or maintain a healthy weight. General recommendations for healthful eating and cooking habits include:  Eating meals and snacks regularly. Avoid going long periods of time without eating to lose weight.  Eating a diet that consists mainly of plant-based foods, such as fruits, vegetables, nuts, legumes, and whole grains.  Using low-heat cooking methods, such as baking, instead of high-heat cooking methods, such as deep frying. Work with your dietitian to make sure you understand how to use the Nutrition Facts information on food labels. How can food affect me? Carbohydrates  Carbohydrates affect your blood glucose level more than any other type of food. Your dietitian will help you determine how many carbohydrates to eat at each meal and teach you how to count carbohydrates. Counting carbohydrates is important to keep your blood glucose at a healthy level, especially if you are using insulin or taking certain medicines for diabetes mellitus. Alcohol  Alcohol can cause sudden decreases in blood glucose (hypoglycemia), especially if you use insulin or take certain medicines for diabetes mellitus. Hypoglycemia can be a life-threatening condition. Symptoms of hypoglycemia (sleepiness, dizziness, and disorientation) are similar to symptoms of having too much alcohol. If your health care provider has given you approval to drink alcohol, do so in moderation and use the following guidelines:  Women should not have more than one drink per day, and men  should not have more than two drinks per day. One drink is equal to:  12 oz of beer.  5 oz of wine.  1 oz of hard liquor.  Do not drink on an empty stomach.  Keep yourself hydrated. Have water, diet soda, or unsweetened iced tea.  Regular soda, juice, and other mixers might contain a lot of carbohydrates and should be counted. What foods are not recommended? As you make food choices, it is important to remember that all foods are not the same. Some foods have fewer nutrients per serving than other foods, even though they might have the same number of calories or carbohydrates. It is difficult to get your body what it needs when you eat foods with fewer nutrients. Examples of foods that you should avoid that are high in calories and carbohydrates but low in nutrients include:  Trans fats (most processed foods list trans fats on the Nutrition Facts label).  Regular soda.  Juice.  Candy.  Sweets, such as cake, pie, doughnuts, and cookies.  Fried foods. What foods can I eat? Eat nutrient-rich foods, which will nourish your body and keep you healthy. The food you should eat also will depend on several factors, including:  The calories you need.  The medicines you take.  Your weight.  Your blood glucose level.  Your blood pressure level.  Your cholesterol level. You should eat a variety of foods, including:  Protein.  Lean cuts of meat.  Proteins low in saturated fats, such as fish, egg whites, and beans. Avoid processed meats.  Fruits and vegetables.  Fruits and vegetables that may help control blood glucose levels, such as apples, mangoes, and   yams.  Dairy products.  Choose fat-free or low-fat dairy products, such as milk, yogurt, and cheese.  Grains, bread, pasta, and rice.  Choose whole grain products, such as multigrain bread, whole oats, and brown rice. These foods may help control blood pressure.  Fats.  Foods containing healthful fats, such as nuts,  avocado, olive oil, canola oil, and fish. Does everyone with diabetes mellitus have the same meal plan? Because every person with diabetes mellitus is different, there is not one meal plan that works for everyone. It is very important that you meet with a dietitian who will help you create a meal plan that is just right for you. This information is not intended to replace advice given to you by your health care provider. Make sure you discuss any questions you have with your health care provider. Document Released: 02/05/2005 Document Revised: 10/17/2015 Document Reviewed: 04/07/2013 Elsevier Interactive Patient Education  2017 [employer]    Hypertension Hypertension, commonly called high blood pressure, is when the force of blood pumping through the arteries is too strong. The arteries are the blood vessels that carry blood from the heart throughout the body. Hypertension forces the heart to work harder to pump blood and may cause arteries to become narrow or stiff. Having untreated or uncontrolled hypertension can cause heart attacks, strokes, kidney disease, and other problems. A blood pressure reading consists of a higher number over a lower number. Ideally, your blood pressure should be below 120/80. The first ("top") number is called the systolic pressure. It is a measure of the pressure in your arteries as your heart beats. The second ("bottom") number is called the diastolic pressure. It is a measure of the pressure in your arteries as the heart relaxes. What are the causes? The cause of this condition is not known. What increases the risk? Some risk factors for high blood pressure are under your control. Others are not. Factors you can change   Smoking.  Having type 2 diabetes mellitus, high cholesterol, or both.  Not getting enough exercise or physical activity.  Being overweight.  Having too much fat, sugar, calories, or salt (sodium) in your diet.  Drinking too much  alcohol. Factors that are difficult or impossible to change   Having chronic kidney disease.  Having a family history of high blood pressure.  Age. Risk increases with age.  Race. You may be at higher risk if you are African-American.  Gender. Men are at higher risk than women before age 38. After age 38, women are at higher risk than men.  Having obstructive sleep apnea.  Stress. What are the signs or symptoms? Extremely high blood pressure (hypertensive crisis) may cause:  Headache.  Anxiety.  Shortness of breath.  Nosebleed.  Nausea and vomiting.  Severe chest pain.  Jerky movements you cannot control (seizures). How is this diagnosed? This condition is diagnosed by measuring your blood pressure while you are seated, with your arm resting on a surface. The cuff of the blood pressure monitor will be placed directly against the skin of your upper arm at the level of your heart. It should be measured at least twice using the same arm. Certain conditions can cause a difference in blood pressure between your right and left arms. Certain factors can cause blood pressure readings to be lower or higher than normal (elevated) for a short period of time:  When your blood pressure is higher when you are in a health care provider's office than when you are  at home, this is called white coat hypertension. Most people with this condition do not need medicines.  When your blood pressure is higher at home than when you are in a health care provider's office, this is called masked hypertension. Most people with this condition may need medicines to control blood pressure. If you have a high blood pressure reading during one visit or you have normal blood pressure with other risk factors:  You may be asked to return on a different day to have your blood pressure checked again.  You may be asked to monitor your blood pressure at home for 1 week or longer. If you are diagnosed with  hypertension, you may have other blood or imaging tests to help your health care provider understand your overall risk for other conditions. How is this treated? This condition is treated by making healthy lifestyle changes, such as eating healthy foods, exercising more, and reducing your alcohol intake. Your health care provider may prescribe medicine if lifestyle changes are not enough to get your blood pressure under control, and if:  Your systolic blood pressure is above 130.  Your diastolic blood pressure is above 80. Your personal target blood pressure may vary depending on your medical conditions, your age, and other factors. Follow these instructions at home: Eating and drinking   Eat a diet that is high in fiber and potassium, and low in sodium, added sugar, and fat. An example eating plan is called the DASH (Dietary Approaches to Stop Hypertension) diet. To eat this way:  Eat plenty of fresh fruits and vegetables. Try to fill half of your plate at each meal with fruits and vegetables.  Eat whole grains, such as whole wheat pasta, brown rice, or whole grain bread. Fill about one quarter of your plate with whole grains.  Eat or drink low-fat dairy products, such as skim milk or low-fat yogurt.  Avoid fatty cuts of meat, processed or cured meats, and poultry with skin. Fill about one quarter of your plate with lean proteins, such as fish, chicken without skin, beans, eggs, and tofu.  Avoid premade and processed foods. These tend to be higher in sodium, added sugar, and fat.  Reduce your daily sodium intake. Most people with hypertension should eat less than 1,500 mg of sodium a day.  Limit alcohol intake to no more than 1 drink a day for nonpregnant women and 2 drinks a day for men. One drink equals 12 oz of beer, 5 oz of wine, or 1 oz of hard liquor. Lifestyle   Work with your health care provider to maintain a healthy body weight or to lose weight. Ask what an ideal weight is  for you.  Get at least 30 minutes of exercise that causes your heart to beat faster (aerobic exercise) most days of the week. Activities may include walking, swimming, or biking.  Include exercise to strengthen your muscles (resistance exercise), such as pilates or lifting weights, as part of your weekly exercise routine. Try to do these types of exercises for 30 minutes at least 3 days a week.  Do not use any products that contain nicotine or tobacco, such as cigarettes and e-cigarettes. If you need help quitting, ask your health care provider.  Monitor your blood pressure at home as told by your health care provider.  Keep all follow-up visits as told by your health care provider. This is important. Medicines   Take over-the-counter and prescription medicines only as told by your health care provider.  Follow directions carefully. Blood pressure medicines must be taken as prescribed.  Do not skip doses of blood pressure medicine. Doing this puts you at risk for problems and can make the medicine less effective.  Ask your health care provider about side effects or reactions to medicines that you should watch for. Contact a health care provider if:  You think you are having a reaction to a medicine you are taking.  You have headaches that keep coming back (recurring).  You feel dizzy.  You have swelling in your ankles.  You have trouble with your vision. Get help right away if:  You develop a severe headache or confusion.  You have unusual weakness or numbness.  You feel faint.  You have severe pain in your chest or abdomen.  You vomit repeatedly.  You have trouble breathing. Summary  Hypertension is when the force of blood pumping through your arteries is too strong. If this condition is not controlled, it may put you at risk for serious complications.  Your personal target blood pressure may vary depending on your medical conditions, your age, and other factors. For  most people, a normal blood pressure is less than 120/80.  Hypertension is treated with lifestyle changes, medicines, or a combination of both. Lifestyle changes include weight loss, eating a healthy, low-sodium diet, exercising more, and limiting alcohol. This information is not intended to replace advice given to you by your health care provider. Make sure you discuss any questions you have with your health care provider. Document Released: 05/11/2005 Document Revised: 04/08/2016 Document Reviewed: 04/08/2016 Elsevier Interactive Patient Education  2017 [employer].   IF you received an x-ray today, you will receive an invoice from [facility]. Please contact [facility] at [phone] with questions or concerns regarding your invoice.   IF you received labwork today, you will receive an invoice from [facility]. Please contact [facility] at [phone] with questions or concerns regarding your invoice.   Our billing staff will not be able to assist you with questions regarding bills from these companies.  You will be contacted with the lab results as soon as they are available. The fastest way to get your results is to activate your My Chart account. Instructions are located on the last page of this paperwork. If you have not heard from [name] regarding the results in 2 weeks, please contact this office.

## 2016-08-10 NOTE — Progress Notes (Signed)
MRN: [id number] DOB: 22-Nov-1980  Subjective:   [name] is a 36 y.o. male with pmh of uncontrolled DM type 2 presenting for chief complaint of Urinary Frequency and Back Pain  Reports 3 day history of urinary frequency, mild hematuria, dysuria, dark urine, intermittent low back pain. Has also had mild occasional headache. Patient was doing heavy lifting on Friday, started having symptoms since then and became worried once he saw blood in his urine. Drinks a lot of Pepsi. Diet is non-compliant. Does not exercise. Denies dizziness, blurred vision, chest pain, shortness of breath, heart racing, palpitations, nausea, vomiting, abdominal pain. Denies smoking cigarettes or drinking alcohol. Of note, patient takes ibuprofen every day, does heavy lifting and has aches and pains from his work for the past 4 weeks.  [name] has a current medication list which includes the following prescription(s): metformin. Also has No Known Allergies.  [name]  has a past medical history of Diabetes mellitus without complication (HCC) and Hypertension. Also denies history of surgeries.  Objective:   Vitals: BP (!) 160/104 (BP Location: Left Arm, Patient Position: Sitting, Cuff Size: Large)   Pulse 91   Temp 97.9 F (36.6 C) (Oral)   Resp 18   Ht 6' 3.25" (1.911 m)   Wt (!) 388 lb 3.2 oz (176.1 kg)   SpO2 98%   BMI 48.20 kg/m   BP Readings from Last 3 Encounters:  08/10/16 (!) 160/104  06/04/16 130/80  04/30/15 (!) 144/104    Physical Exam  Constitutional: He is oriented to person, place, and time. He appears well-developed and well-nourished.  HENT:  Mouth/Throat: Oropharynx is clear and moist.  Eyes: No scleral icterus.  Neck: Normal range of motion. Neck supple.  Cardiovascular: Normal rate, regular rhythm and intact distal pulses.  Exam reveals no gallop and no friction rub.   No murmur heard. Pulmonary/Chest: No respiratory distress. He has no wheezes. He has no rales.  Abdominal:  Soft. Bowel sounds are normal. He exhibits no distension and no mass. There is no tenderness. There is no guarding.  Neurological: He is alert and oriented to person, place, and time.  Skin: Skin is warm and dry.   Results for orders placed or performed in visit on 08/10/16 (from the past 24 hour(s))  POCT urinalysis dipstick     Status: Abnormal   Collection Time: 08/10/16 11:16 AM  Result Value Ref Range   Color, UA yellow yellow   Clarity, UA clear clear   Glucose, UA =500 (A) negative   Bilirubin, UA negative negative   Ketones, POC UA negative negative   Spec Grav, UA 1.020 1.030 - 1.035   Blood, UA negative negative   pH, UA 6.0 5.0 - 8.0   Protein Ur, POC negative negative   Urobilinogen, UA 0.2 Negative - 2.0   Nitrite, UA Negative Negative   Leukocytes, UA Negative Negative  POCT Microscopic Urinalysis ([facility])     Status: None   Collection Time: 08/10/16 11:16 AM  Result Value Ref Range   WBC,UR,HPF,POC None None WBC/hpf   RBC,UR,HPF,POC None None RBC/hpf   Bacteria None None, Too numerous to count   Mucus Absent Absent   Epithelial Cells, UR Per Microscopy None None, Too numerous to count cells/hpf  POCT glycosylated hemoglobin (Hb A1C)     Status: None   Collection Time: 08/10/16 11:17 AM  Result Value Ref Range   Hemoglobin A1C 10.3   POCT glucose (manual entry)     Status: Abnormal  Collection Time: 08/10/16 11:17 AM  Result Value Ref Range   POC Glucose 241 (A) 70 - 99 mg/dl   Assessment and Plan :   1. Urinary frequency 2. Acute low back pain without sciatica, unspecified back pain laterality 3. Uncontrolled type 2 diabetes mellitus without complication, without long-term current use of insulin (HCC) 4. Essential hypertension 5. Elevated blood pressure reading 6. Morbid obesity (HCC) - Labs pending, recommended significant lifestyle modifications. Will have patient start lis-HCTZ for HTN. Add glipizide for better diabetes control. Counseled patient on  warning signs and symptoms of cystitis, renal stone. Also counseled him on HTN urgency/emergency as a source of hematuria. Patient verbalized understanding. Will rtc in 4 weeks for BP check. Recheck of his DM in 3 months.  [name] BambergMario Adam Sanjuan, PA-C [name] [phone] 08/10/2016  10:59 AM

## 2016-08-11 LAB — BASIC METABOLIC PANEL
BUN / CREAT RATIO: 12 (ref 9–20)
BUN: 8 mg/dL (ref 6–20)
CALCIUM: 9.3 mg/dL (ref 8.7–10.2)
CHLORIDE: 95 mmol/L — AB (ref 96–106)
CO2: 24 mmol/L (ref 18–29)
Creatinine, Ser: 0.66 mg/dL — ABNORMAL LOW (ref 0.76–1.27)
GFR calc Af Amer: 143 mL/min/{1.73_m2} (ref >59)
GFR calc non Af Amer: 124 mL/min/{1.73_m2} (ref >59)
GLUCOSE: 244 mg/dL — AB (ref 65–99)
Potassium: 4.3 mmol/L (ref 3.5–5.2)
Sodium: 136 mmol/L (ref 134–144)

## 2016-09-03 ENCOUNTER — Encounter: Payer: Self-pay | Admitting: Urgent Care

## 2016-09-03 ENCOUNTER — Ambulatory Visit (INDEPENDENT_AMBULATORY_CARE_PROVIDER_SITE_OTHER): Payer: Self-pay | Admitting: Urgent Care

## 2016-09-03 VITALS — BP 140/85 | HR 92 | Temp 98.0°F | Resp 16 | Ht 75.0 in | Wt 383.0 lb

## 2016-09-03 DIAGNOSIS — IMO0001 Reserved for inherently not codable concepts without codable children: Secondary | ICD-10-CM

## 2016-09-03 DIAGNOSIS — I10 Essential (primary) hypertension: Secondary | ICD-10-CM

## 2016-09-03 DIAGNOSIS — E1165 Type 2 diabetes mellitus with hyperglycemia: Secondary | ICD-10-CM

## 2016-09-03 NOTE — Progress Notes (Signed)
   MRN: [id number] DOB: September 03, 1980  Subjective:   [name] is a 36 y.o. male presenting for follow up on HTN. Patient has started his BP medications. He has made dramatic lifestyle changes. Feels much better. Still working on healthy dietary choices. Denies dizziness, chronic headache, blurred vision, chest pain, shortness of breath, heart racing, palpitations, nausea, vomiting, abdominal pain, hematuria, lower leg swelling. Regarding diabetes, his urinary frequency is improving as well. He is checking his feet daily. Cutting back on his starchy foods and carbohydrates. Denies ROS as above.  [name] has a current medication list which includes the following prescription(s): glipizide, lisinopril-hydrochlorothiazide, and metformin. Also has No Known Allergies.  [name]  has a past medical history of Diabetes mellitus without complication (HCC) and Hypertension. Also  has no past surgical history on file.  Objective:   Vitals: BP 140/85   Pulse 92   Temp 98 F (36.7 C) (Oral)   Resp 16   Ht  (1.905 m)   Wt (!) 383 lb (173.7 kg)   SpO2 99%   BMI 47.87 kg/m   BP Readings from Last 3 Encounters:  09/03/16 140/85  08/10/16 (!) 160/104  06/04/16 130/80    Physical Exam  Constitutional: He is oriented to person, place, and time. He appears well-developed and well-nourished.  HENT:  Mouth/Throat: Oropharynx is clear and moist.  Eyes: No scleral icterus.  Neck: Normal range of motion. Neck supple.  Cardiovascular: Normal rate, regular rhythm and intact distal pulses.  Exam reveals no gallop and no friction rub.   No murmur heard. Pulmonary/Chest: No respiratory distress. He has no wheezes. He has no rales.  Lymphadenopathy:    He has no cervical adenopathy.  Neurological: He is alert and oriented to person, place, and time.  Skin: Skin is warm and dry.  Psychiatric: He has a normal mood and affect.   Diabetic Foot Exam - Simple   Simple Foot Form Diabetic Foot exam  was performed with the following findings:  Yes 09/03/2016  8:46 AM  Visual Inspection No deformities, no ulcerations, no other skin breakdown bilaterally:  Yes Sensation Testing Intact to touch and monofilament testing bilaterally:  Yes Pulse Check Posterior Tibialis and Dorsalis pulse intact bilaterally:  Yes Comments    Assessment and Plan :   1. Essential hypertension - Continue lifestyle modifications. Hold off on increasing dose for now. Recheck in 3 months. Consider doubling dose or adding amlodipine.  2. Uncontrolled type 2 diabetes mellitus without complication, without long-term current use of insulin (HCC) - Recheck in 3 months. Continue current regimen. Reviewed diabetic friendly foods.  - HM Diabetes Foot Exam   Wallis Bamberg, PA-C [facility] [phone] 09/03/2016 8:39 AM

## 2016-09-03 NOTE — Patient Instructions (Addendum)
Salads - Kale, Spinach, Cabbage, Spring mix Fruits - Avocadoes, berries (blueberries, raspberries, blackberries), apples, oranges, pomegranate Seeds - Quinoa, [place] seeds; you can also incorporate oatmeal Vegetables - aspargus, mashed cauliflower, broccoli, green beans, brussel spouts, bell peppers; stay away from starchy vegetables like potatoes, carrots, peas  Brussel sprouts - Cut off stems. Place in a mixing bowl that has a lid. Pour in a 1/4-1/2 cup olive oil, spices, use a light amount of parmesan. Place on a baking sheet. Bake for 10 minutes at 400F. Take it out, eat the brussel chips. Place for another 5-10 minutes.   Mashed cauliflower - Boil a bunch of cauliflower in a pot of water. Blend in a food processor with 1-2 tablespoons of butter.  Spaghetti squash -  Cut the squash in half very carefully, clean out seeds from the middle. Place 1/2 face down in a microwave safe dish with at least 2 inches of water. Make 4-6 slits on outside of spaghetti squash and microwave for 10-12 minutes. Take out the spaghetti using a metal spoon. Repeat for the other half.   Vega protein is good protein powder, make sure you use ~6 ice cubes to give it smoothie consistency together with ~4-6 ounces of vanilla soy milk. Throw cinnamon into your shake, use peanut butter. You can also use the fruits that I listed above. Throw spinach or kale into the shake.     Hypertension Hypertension, commonly called high blood pressure, is when the force of blood pumping through the arteries is too strong. The arteries are the blood vessels that carry blood from the heart throughout the body. Hypertension forces the heart to work harder to pump blood and may cause arteries to become narrow or stiff. Having untreated or uncontrolled hypertension can cause heart attacks, strokes, kidney disease, and other problems. A blood pressure reading consists of a higher number over a lower number. Ideally, your blood pressure should be  below 120/80. The first ("top") number is called the systolic pressure. It is a measure of the pressure in your arteries as your heart beats. The second ("bottom") number is called the diastolic pressure. It is a measure of the pressure in your arteries as the heart relaxes. What are the causes? The cause of this condition is not known. What increases the risk? Some risk factors for high blood pressure are under your control. Others are not. Factors you can change   Smoking.  Having type 2 diabetes mellitus, high cholesterol, or both.  Not getting enough exercise or physical activity.  Being overweight.  Having too much fat, sugar, calories, or salt (sodium) in your diet.  Drinking too much alcohol. Factors that are difficult or impossible to change   Having chronic kidney disease.  Having a family history of high blood pressure.  Age. Risk increases with age.  Race. You may be at higher risk if you are African-American.  Gender. Men are at higher risk than women before age 22. After age 24, women are at higher risk than men.  Having obstructive sleep apnea.  Stress. What are the signs or symptoms? Extremely high blood pressure (hypertensive crisis) may cause:  Headache.  Anxiety.  Shortness of breath.  Nosebleed.  Nausea and vomiting.  Severe chest pain.  Jerky movements you cannot control (seizures). How is this diagnosed? This condition is diagnosed by measuring your blood pressure while you are seated, with your arm resting on a surface. The cuff of the blood pressure monitor will be placed directly  against the skin of your upper arm at the level of your heart. It should be measured at least twice using the same arm. Certain conditions can cause a difference in blood pressure between your right and left arms. Certain factors can cause blood pressure readings to be lower or higher than normal (elevated) for a short period of time:  When your blood pressure is  higher when you are in a health care provider's office than when you are at home, this is called white coat hypertension. Most people with this condition do not need medicines.  When your blood pressure is higher at home than when you are in a health care provider's office, this is called masked hypertension. Most people with this condition may need medicines to control blood pressure. If you have a high blood pressure reading during one visit or you have normal blood pressure with other risk factors:  You may be asked to return on a different day to have your blood pressure checked again.  You may be asked to monitor your blood pressure at home for 1 week or longer. If you are diagnosed with hypertension, you may have other blood or imaging tests to help your health care provider understand your overall risk for other conditions. How is this treated? This condition is treated by making healthy lifestyle changes, such as eating healthy foods, exercising more, and reducing your alcohol intake. Your health care provider may prescribe medicine if lifestyle changes are not enough to get your blood pressure under control, and if:  Your systolic blood pressure is above 130.  Your diastolic blood pressure is above 80. Your personal target blood pressure may vary depending on your medical conditions, your age, and other factors. Follow these instructions at home: Eating and drinking   Eat a diet that is high in fiber and potassium, and low in sodium, added sugar, and fat. An example eating plan is called the DASH (Dietary Approaches to Stop Hypertension) diet. To eat this way:  Eat plenty of fresh fruits and vegetables. Try to fill half of your plate at each meal with fruits and vegetables.  Eat whole grains, such as whole wheat pasta, brown rice, or whole grain bread. Fill about one quarter of your plate with whole grains.  Eat or drink low-fat dairy products, such as skim milk or low-fat  yogurt.  Avoid fatty cuts of meat, processed or cured meats, and poultry with skin. Fill about one quarter of your plate with lean proteins, such as fish, chicken without skin, beans, eggs, and tofu.  Avoid premade and processed foods. These tend to be higher in sodium, added sugar, and fat.  Reduce your daily sodium intake. Most people with hypertension should eat less than 1,500 mg of sodium a day.  Limit alcohol intake to no more than 1 drink a day for nonpregnant women and 2 drinks a day for men. One drink equals 12 oz of beer, 5 oz of wine, or 1 oz of hard liquor. Lifestyle   Work with your health care provider to maintain a healthy body weight or to lose weight. Ask what an ideal weight is for you.  Get at least 30 minutes of exercise that causes your heart to beat faster (aerobic exercise) most days of the week. Activities may include walking, swimming, or biking.  Include exercise to strengthen your muscles (resistance exercise), such as pilates or lifting weights, as part of your weekly exercise routine. Try to do these types of exercises  for 30 minutes at least 3 days a week.  Do not use any products that contain nicotine or tobacco, such as cigarettes and e-cigarettes. If you need help quitting, ask your health care provider.  Monitor your blood pressure at home as told by your health care provider.  Keep all follow-up visits as told by your health care provider. This is important. Medicines   Take over-the-counter and prescription medicines only as told by your health care provider. Follow directions carefully. Blood pressure medicines must be taken as prescribed.  Do not skip doses of blood pressure medicine. Doing this puts you at risk for problems and can make the medicine less effective.  Ask your health care provider about side effects or reactions to medicines that you should watch for. Contact a health care provider if:  You think you are having a reaction to a  medicine you are taking.  You have headaches that keep coming back (recurring).  You feel dizzy.  You have swelling in your ankles.  You have trouble with your vision. Get help right away if:  You develop a severe headache or confusion.  You have unusual weakness or numbness.  You feel faint.  You have severe pain in your chest or abdomen.  You vomit repeatedly.  You have trouble breathing. Summary  Hypertension is when the force of blood pumping through your arteries is too strong. If this condition is not controlled, it may put you at risk for serious complications.  Your personal target blood pressure may vary depending on your medical conditions, your age, and other factors. For most people, a normal blood pressure is less than 120/80.  Hypertension is treated with lifestyle changes, medicines, or a combination of both. Lifestyle changes include weight loss, eating a healthy, low-sodium diet, exercising more, and limiting alcohol. This information is not intended to replace advice given to you by your health care provider. Make sure you discuss any questions you have with your health care provider. Document Released: 05/11/2005 Document Revised: 04/08/2016 Document Reviewed: 04/08/2016 Elsevier Interactive Patient Education  2017 [employer]    Diabetes Mellitus and Food It is important for you to manage your blood sugar (glucose) level. Your blood glucose level can be greatly affected by what you eat. Eating healthier foods in the appropriate amounts throughout the day at about the same time each day will help you control your blood glucose level. It can also help slow or prevent worsening of your diabetes mellitus. Healthy eating may even help you improve the level of your blood pressure and reach or maintain a healthy weight. General recommendations for healthful eating and cooking habits include:  Eating meals and snacks regularly. Avoid going long periods of time  without eating to lose weight.  Eating a diet that consists mainly of plant-based foods, such as fruits, vegetables, nuts, legumes, and whole grains.  Using low-heat cooking methods, such as baking, instead of high-heat cooking methods, such as deep frying. Work with your dietitian to make sure you understand how to use the Nutrition Facts information on food labels. How can food affect me? Carbohydrates  Carbohydrates affect your blood glucose level more than any other type of food. Your dietitian will help you determine how many carbohydrates to eat at each meal and teach you how to count carbohydrates. Counting carbohydrates is important to keep your blood glucose at a healthy level, especially if you are using insulin or taking certain medicines for diabetes mellitus. Alcohol  Alcohol can cause  sudden decreases in blood glucose (hypoglycemia), especially if you use insulin or take certain medicines for diabetes mellitus. Hypoglycemia can be a life-threatening condition. Symptoms of hypoglycemia (sleepiness, dizziness, and disorientation) are similar to symptoms of having too much alcohol. If your health care provider has given you approval to drink alcohol, do so in moderation and use the following guidelines:  Women should not have more than one drink per day, and men should not have more than two drinks per day. One drink is equal to:  12 oz of beer.  5 oz of wine.  1 oz of hard liquor.  Do not drink on an empty stomach.  Keep yourself hydrated. Have water, diet soda, or unsweetened iced tea.  Regular soda, juice, and other mixers might contain a lot of carbohydrates and should be counted. What foods are not recommended? As you make food choices, it is important to remember that all foods are not the same. Some foods have fewer nutrients per serving than other foods, even though they might have the same number of calories or carbohydrates. It is difficult to get your body what it  needs when you eat foods with fewer nutrients. Examples of foods that you should avoid that are high in calories and carbohydrates but low in nutrients include:  Trans fats (most processed foods list trans fats on the Nutrition Facts label).  Regular soda.  Juice.  Candy.  Sweets, such as cake, pie, doughnuts, and cookies.  Fried foods. What foods can I eat? Eat nutrient-rich foods, which will nourish your body and keep you healthy. The food you should eat also will depend on several factors, including:  The calories you need.  The medicines you take.  Your weight.  Your blood glucose level.  Your blood pressure level.  Your cholesterol level. You should eat a variety of foods, including:  Protein.  Lean cuts of meat.  Proteins low in saturated fats, such as fish, egg whites, and beans. Avoid processed meats.  Fruits and vegetables.  Fruits and vegetables that may help control blood glucose levels, such as apples, mangoes, and yams.  Dairy products.  Choose fat-free or low-fat dairy products, such as milk, yogurt, and cheese.  Grains, bread, pasta, and rice.  Choose whole grain products, such as multigrain bread, whole oats, and brown rice. These foods may help control blood pressure.  Fats.  Foods containing healthful fats, such as nuts, avocado, olive oil, canola oil, and fish. Does everyone with diabetes mellitus have the same meal plan? Because every person with diabetes mellitus is different, there is not one meal plan that works for everyone. It is very important that you meet with a dietitian who will help you create a meal plan that is just right for you. This information is not intended to replace advice given to you by your health care provider. Make sure you discuss any questions you have with your health care provider. Document Released: 02/05/2005 Document Revised: 10/17/2015 Document Reviewed: 04/07/2013 Elsevier Interactive Patient Education  2017  [employer].     IF you received an x-ray today, you will receive an invoice from [facility]. Please contact [facility] at [phone] with questions or concerns regarding your invoice.   IF you received labwork today, you will receive an invoice from [place]. Please contact [facility] at [phone] with questions or concerns regarding your invoice.   Our billing staff will not be able to assist you with questions regarding bills from these companies.  You  will be contacted with the lab results as soon as they are available. The fastest way to get your results is to activate your My Chart account. Instructions are located on the last page of this paperwork. If you have not heard from [name] regarding the results in 2 weeks, please contact this office.

## 2016-12-03 ENCOUNTER — Ambulatory Visit (INDEPENDENT_AMBULATORY_CARE_PROVIDER_SITE_OTHER): Payer: Self-pay | Admitting: Urgent Care

## 2016-12-03 ENCOUNTER — Encounter: Payer: Self-pay | Admitting: Urgent Care

## 2016-12-03 VITALS — BP 147/83 | HR 98 | Temp 98.4°F | Resp 18 | Ht 75.0 in | Wt 396.6 lb

## 2016-12-03 DIAGNOSIS — I10 Essential (primary) hypertension: Secondary | ICD-10-CM

## 2016-12-03 DIAGNOSIS — R05 Cough: Secondary | ICD-10-CM

## 2016-12-03 DIAGNOSIS — E1165 Type 2 diabetes mellitus with hyperglycemia: Secondary | ICD-10-CM

## 2016-12-03 DIAGNOSIS — R059 Cough, unspecified: Secondary | ICD-10-CM

## 2016-12-03 DIAGNOSIS — IMO0001 Reserved for inherently not codable concepts without codable children: Secondary | ICD-10-CM

## 2016-12-03 DIAGNOSIS — Z23 Encounter for immunization: Secondary | ICD-10-CM

## 2016-12-03 DIAGNOSIS — R030 Elevated blood-pressure reading, without diagnosis of hypertension: Secondary | ICD-10-CM

## 2016-12-03 DIAGNOSIS — R0982 Postnasal drip: Secondary | ICD-10-CM

## 2016-12-03 DIAGNOSIS — Z6841 Body Mass Index (BMI) 40.0 and over, adult: Secondary | ICD-10-CM

## 2016-12-03 MED ORDER — LISINOPRIL-HYDROCHLOROTHIAZIDE 20-12.5 MG PO TABS: 1.0000 | ORAL_TABLET | Freq: Every day | ORAL | 1 refills | Status: DC

## 2016-12-03 NOTE — Patient Instructions (Addendum)
Diabetes Mellitus and Food It is important for you to manage your blood sugar (glucose) level. Your blood glucose level can be greatly affected by what you eat. Eating healthier foods in the appropriate amounts throughout the day at about the same time each day will help you control your blood glucose level. It can also help slow or prevent worsening of your diabetes mellitus. Healthy eating may even help you improve the level of your blood pressure and reach or maintain a healthy weight. General recommendations for healthful eating and cooking habits include:  Eating meals and snacks regularly. Avoid going long periods of time without eating to lose weight.  Eating a diet that consists mainly of plant-based foods, such as fruits, vegetables, nuts, legumes, and whole grains.  Using low-heat cooking methods, such as baking, instead of high-heat cooking methods, such as deep frying.  Work with your dietitian to make sure you understand how to use the Nutrition Facts information on food labels. How can food affect me? Carbohydrates Carbohydrates affect your blood glucose level more than any other type of food. Your dietitian will help you determine how many carbohydrates to eat at each meal and teach you how to count carbohydrates. Counting carbohydrates is important to keep your blood glucose at a healthy level, especially if you are using insulin or taking certain medicines for diabetes mellitus. Alcohol Alcohol can cause sudden decreases in blood glucose (hypoglycemia), especially if you use insulin or take certain medicines for diabetes mellitus. Hypoglycemia can be a life-threatening condition. Symptoms of hypoglycemia (sleepiness, dizziness, and disorientation) are similar to symptoms of having too much alcohol. If your health care provider has given you approval to drink alcohol, do so in moderation and use the following guidelines:  Women should not have more than one drink per day, and men  should not have more than two drinks per day. One drink is equal to: ? 12 oz of beer. ? 5 oz of wine. ? 1 oz of hard liquor.  Do not drink on an empty stomach.  Keep yourself hydrated. Have water, diet soda, or unsweetened iced tea.  Regular soda, juice, and other mixers might contain a lot of carbohydrates and should be counted.  What foods are not recommended? As you make food choices, it is important to remember that all foods are not the same. Some foods have fewer nutrients per serving than other foods, even though they might have the same number of calories or carbohydrates. It is difficult to get your body what it needs when you eat foods with fewer nutrients. Examples of foods that you should avoid that are high in calories and carbohydrates but low in nutrients include:  Trans fats (most processed foods list trans fats on the Nutrition Facts label).  Regular soda.  Juice.  Candy.  Sweets, such as cake, pie, doughnuts, and cookies.  Fried foods.  What foods can I eat? Eat nutrient-rich foods, which will nourish your body and keep you healthy. The food you should eat also will depend on several factors, including:  The calories you need.  The medicines you take.  Your weight.  Your blood glucose level.  Your blood pressure level.  Your cholesterol level.  You should eat a variety of foods, including:  Protein. ? Lean cuts of meat. ? Proteins low in saturated fats, such as fish, egg whites, and beans. Avoid processed meats.  Fruits and vegetables. ? Fruits and vegetables that may help control blood glucose levels, such as apples,   mangoes, and yams.  Dairy products. ? Choose fat-free or low-fat dairy products, such as milk, yogurt, and cheese.  Grains, bread, pasta, and rice. ? Choose whole grain products, such as multigrain bread, whole oats, and brown rice. These foods may help control blood pressure.  Fats. ? Foods containing healthful fats, such as  nuts, avocado, olive oil, canola oil, and fish.  Does everyone with diabetes mellitus have the same meal plan? Because every person with diabetes mellitus is different, there is not one meal plan that works for everyone. It is very important that you meet with a dietitian who will help you create a meal plan that is just right for you. This information is not intended to replace advice given to you by your health care provider. Make sure you discuss any questions you have with your health care provider. Document Released: 02/05/2005 Document Revised: 10/17/2015 Document Reviewed: 04/07/2013 Elsevier Interactive Patient Education  2017 Elsevier Inc.     Hypertension Hypertension, commonly called high blood pressure, is when the force of blood pumping through the arteries is too strong. The arteries are the blood vessels that carry blood from the heart throughout the body. Hypertension forces the heart to work harder to pump blood and may cause arteries to become narrow or stiff. Having untreated or uncontrolled hypertension can cause heart attacks, strokes, kidney disease, and other problems. A blood pressure reading consists of a higher number over a lower number. Ideally, your blood pressure should be below 120/80. The first ("top") number is called the systolic pressure. It is a measure of the pressure in your arteries as your heart beats. The second ("bottom") number is called the diastolic pressure. It is a measure of the pressure in your arteries as the heart relaxes. What are the causes? The cause of this condition is not known. What increases the risk? Some risk factors for high blood pressure are under your control. Others are not. Factors you can change  Smoking.  Having type 2 diabetes mellitus, high cholesterol, or both.  Not getting enough exercise or physical activity.  Being overweight.  Having too much fat, sugar, calories, or salt (sodium) in your diet.  Drinking too  much alcohol. Factors that are difficult or impossible to change  Having chronic kidney disease.  Having a family history of high blood pressure.  Age. Risk increases with age.  Race. You may be at higher risk if you are African-American.  Gender. Men are at higher risk than women before age 45. After age 65, women are at higher risk than men.  Having obstructive sleep apnea.  Stress. What are the signs or symptoms? Extremely high blood pressure (hypertensive crisis) may cause:  Headache.  Anxiety.  Shortness of breath.  Nosebleed.  Nausea and vomiting.  Severe chest pain.  Jerky movements you cannot control (seizures).  How is this diagnosed? This condition is diagnosed by measuring your blood pressure while you are seated, with your arm resting on a surface. The cuff of the blood pressure monitor will be placed directly against the skin of your upper arm at the level of your heart. It should be measured at least twice using the same arm. Certain conditions can cause a difference in blood pressure between your right and left arms. Certain factors can cause blood pressure readings to be lower or higher than normal (elevated) for a short period of time:  When your blood pressure is higher when you are in a health care provider's office than   when you are at home, this is called white coat hypertension. Most people with this condition do not need medicines.  When your blood pressure is higher at home than when you are in a health care provider's office, this is called masked hypertension. Most people with this condition may need medicines to control blood pressure.  If you have a high blood pressure reading during one visit or you have normal blood pressure with other risk factors:  You may be asked to return on a different day to have your blood pressure checked again.  You may be asked to monitor your blood pressure at home for 1 week or longer.  If you are diagnosed  with hypertension, you may have other blood or imaging tests to help your health care provider understand your overall risk for other conditions. How is this treated? This condition is treated by making healthy lifestyle changes, such as eating healthy foods, exercising more, and reducing your alcohol intake. Your health care provider may prescribe medicine if lifestyle changes are not enough to get your blood pressure under control, and if:  Your systolic blood pressure is above 130.  Your diastolic blood pressure is above 80.  Your personal target blood pressure may vary depending on your medical conditions, your age, and other factors. Follow these instructions at home: Eating and drinking  Eat a diet that is high in fiber and potassium, and low in sodium, added sugar, and fat. An example eating plan is called the DASH (Dietary Approaches to Stop Hypertension) diet. To eat this way: ? Eat plenty of fresh fruits and vegetables. Try to fill half of your plate at each meal with fruits and vegetables. ? Eat whole grains, such as whole wheat pasta, brown rice, or whole grain bread. Fill about one quarter of your plate with whole grains. ? Eat or drink low-fat dairy products, such as skim milk or low-fat yogurt. ? Avoid fatty cuts of meat, processed or cured meats, and poultry with skin. Fill about one quarter of your plate with lean proteins, such as fish, chicken without skin, beans, eggs, and tofu. ? Avoid premade and processed foods. These tend to be higher in sodium, added sugar, and fat.  Reduce your daily sodium intake. Most people with hypertension should eat less than 1,500 mg of sodium a day.  Limit alcohol intake to no more than 1 drink a day for nonpregnant women and 2 drinks a day for men. One drink equals 12 oz of beer, 5 oz of wine, or 1 oz of hard liquor. Lifestyle  Work with your health care provider to maintain a healthy body weight or to lose weight. Ask what an ideal weight  is for you.  Get at least 30 minutes of exercise that causes your heart to beat faster (aerobic exercise) most days of the week. Activities may include walking, swimming, or biking.  Include exercise to strengthen your muscles (resistance exercise), such as pilates or lifting weights, as part of your weekly exercise routine. Try to do these types of exercises for 30 minutes at least 3 days a week.  Do not use any products that contain nicotine or tobacco, such as cigarettes and e-cigarettes. If you need help quitting, ask your health care provider.  Monitor your blood pressure at home as told by your health care provider.  Keep all follow-up visits as told by your health care provider. This is important. Medicines  Take over-the-counter and prescription medicines only as told by your   health care provider. Follow directions carefully. Blood pressure medicines must be taken as prescribed.  Do not skip doses of blood pressure medicine. Doing this puts you at risk for problems and can make the medicine less effective.  Ask your health care provider about side effects or reactions to medicines that you should watch for. Contact a health care provider if:  You think you are having a reaction to a medicine you are taking.  You have headaches that keep coming back (recurring).  You feel dizzy.  You have swelling in your ankles.  You have trouble with your vision. Get help right away if:  You develop a severe headache or confusion.  You have unusual weakness or numbness.  You feel faint.  You have severe pain in your chest or abdomen.  You vomit repeatedly.  You have trouble breathing. Summary  Hypertension is when the force of blood pumping through your arteries is too strong. If this condition is not controlled, it may put you at risk for serious complications.  Your personal target blood pressure may vary depending on your medical conditions, your age, and other factors. For  most people, a normal blood pressure is less than 120/80.  Hypertension is treated with lifestyle changes, medicines, or a combination of both. Lifestyle changes include weight loss, eating a healthy, low-sodium diet, exercising more, and limiting alcohol. This information is not intended to replace advice given to you by your health care provider. Make sure you discuss any questions you have with your health care provider. Document Released: 05/11/2005 Document Revised: 04/08/2016 Document Reviewed: 04/08/2016 Elsevier Interactive Patient Education  2018 Elsevier Inc.     IF you received an x-ray today, you will receive an invoice from [facility]. Please contact [facility] at [phone] with questions or concerns regarding your invoice.   IF you received labwork today, you will receive an invoice from [facility]. Please contact [facility] at [phone] with questions or concerns regarding your invoice.   Our billing staff will not be able to assist you with questions regarding bills from these companies.  You will be contacted with the lab results as soon as they are available. The fastest way to get your results is to activate your My Chart account. Instructions are located on the last page of this paperwork. If you have not heard from us regarding the results in 2 weeks, please contact this office.     

## 2016-12-03 NOTE — Progress Notes (Signed)
    MRN: [id number] DOB: 03/28/1981  Subjective:   [name] is a 36 y.o. male presenting for follow up on Hypertension.   HTN - Currently managed with lis-HCTZ. Patient is not checking blood pressure at home. Avoids salt in diet, is not exercising but stays very active at work. Hydrates very well. Gets occasional headaches, drinks water to resolve them. Has had a persistent cough for the past 2 weeks, scratchy throat. Has tried Zyrtec once but had an AE of tingling of his skin. He switched to DayQuil and has done better with this. Denies dizziness, blurred vision, chest pain, shortness of breath, heart racing, palpitations, nausea, vomiting, abdominal pain, hematuria. Denies smoking cigarettes or drinking alcohol.   DM - Managed with Metformin 1,000mg  BID. Patient is not checking blood sugar at home. Patient denies blurred vision, polydipsia, chest pain, nausea, vomiting, abdominal pain, hematuria, polyuria, skin infections, numbness or tingling.   [name] has a current medication list which includes the following prescription(s): glipizide, lisinopril-hydrochlorothiazide, and metformin. Also has No Known Allergies. [name]  has a past medical history of Diabetes mellitus without complication (HCC) and Hypertension. Also denies past surgical history.  Objective:   Vitals: BP (!) 147/83   Pulse 98   Temp 98.4 F (36.9 C) (Oral)   Resp 18   Ht 6\' 3"  (1.905 m)   Wt (!) 396 lb 9.6 oz (179.9 kg)   SpO2 98%   BMI 49.57 kg/m   BP Readings from Last 3 Encounters:  12/03/16 (!) 147/83  09/03/16 140/85  08/10/16 (!) 160/104   Wt Readings from Last 3 Encounters:  12/03/16 (!) 396 lb 9.6 oz (179.9 kg)  09/03/16 (!) 383 lb (173.7 kg)  08/10/16 (!) 388 lb 3.2 oz (176.1 kg)   Physical Exam  Constitutional: He is oriented to person, place, and time. He appears well-developed and well-nourished.  HENT:  TM's intact bilaterally, no effusions or erythema. Nasal turbinates boggy and  edematous, nasal passages patent. No sinus tenderness. Oropharynx with thick streaks of post-nasal drainage, mucous membranes moist.  Eyes: No scleral icterus.  Neck: Normal range of motion. Neck supple. No thyromegaly present.  Cardiovascular: Normal rate, regular rhythm and intact distal pulses.  Exam reveals no gallop and no friction rub.   No murmur heard. Pulmonary/Chest: No respiratory distress. He has no wheezes. He has no rales.  Neurological: He is alert and oriented to person, place, and time.  Skin: Skin is warm and dry.   Assessment and Plan :   1. Uncontrolled type 2 diabetes mellitus without complication, without long-term current use of insulin (HCC) 2. Body mass index (BMI) of 45.0-49.9 in adult [facility](HCC) - Labs pending, maintain current dose of metformin and glipizide. Continue with efforts at dietary modifications. Recheck in 3 months.   3. Essential hypertension 4. Elevated blood pressure reading - Will increase lisinopril from 10mg  to 20mg . Maintain HCTZ. Labs pending. - lisinopril-hydrochlorothiazide (ZESTORETIC) 20-12.5 MG tablet; Take 1 tablet by mouth daily.  Dispense: 90 tablet; Refill: 1  5. Need for Tdap vaccination - Tdap vaccine greater than or equal to 7yo IM  6. Cough 7. Post-nasal drainage - Start Allegra daily. Return-to-clinic precautions discussed, patient verbalized understanding.   Wallis BambergMario Inman Fettig, PA-C Primary Care at [facility] [phone] 12/03/2016  8:11 AM

## 2016-12-04 LAB — HEMOGLOBIN A1C
ESTIMATED AVERAGE GLUCOSE: 197 mg/dL
Hgb A1c MFr Bld: 8.5 % — ABNORMAL HIGH (ref 4.8–5.6)

## 2016-12-04 LAB — COMPREHENSIVE METABOLIC PANEL
A/G RATIO: 1.3 (ref 1.2–2.2)
ALK PHOS: 73 IU/L (ref 39–117)
ALT: 25 IU/L (ref 0–44)
AST: 24 IU/L (ref 0–40)
Albumin: 4 g/dL (ref 3.5–5.5)
BUN/Creatinine Ratio: 15 (ref 9–20)
BUN: 10 mg/dL (ref 6–20)
CHLORIDE: 102 mmol/L (ref 96–106)
CO2: 22 mmol/L (ref 20–29)
Calcium: 9.2 mg/dL (ref 8.7–10.2)
Creatinine, Ser: 0.67 mg/dL — ABNORMAL LOW (ref 0.76–1.27)
GFR calc Af Amer: 142 mL/min/{1.73_m2} (ref >59)
GFR calc non Af Amer: 123 mL/min/{1.73_m2} (ref >59)
Globulin, Total: 3.2 g/dL (ref 1.5–4.5)
Glucose: 235 mg/dL — ABNORMAL HIGH (ref 65–99)
POTASSIUM: 4.3 mmol/L (ref 3.5–5.2)
Sodium: 140 mmol/L (ref 134–144)
Total Protein: 7.2 g/dL (ref 6.0–8.5)

## 2017-03-11 ENCOUNTER — Ambulatory Visit: Payer: Self-pay | Admitting: Urgent Care

## 2017-06-18 ENCOUNTER — Other Ambulatory Visit: Payer: Self-pay

## 2017-06-18 ENCOUNTER — Ambulatory Visit: Payer: Self-pay | Admitting: Physician Assistant

## 2017-06-18 ENCOUNTER — Encounter: Payer: Self-pay | Admitting: Physician Assistant

## 2017-06-18 VITALS — BP 140/90 | HR 103 | Temp 98.6°F | Resp 16 | Ht 75.0 in | Wt 374.0 lb

## 2017-06-18 DIAGNOSIS — M79662 Pain in left lower leg: Secondary | ICD-10-CM

## 2017-06-18 DIAGNOSIS — M7989 Other specified soft tissue disorders: Secondary | ICD-10-CM

## 2017-06-18 DIAGNOSIS — L0390 Cellulitis, unspecified: Secondary | ICD-10-CM

## 2017-06-18 DIAGNOSIS — E1165 Type 2 diabetes mellitus with hyperglycemia: Secondary | ICD-10-CM

## 2017-06-18 DIAGNOSIS — IMO0001 Reserved for inherently not codable concepts without codable children: Secondary | ICD-10-CM

## 2017-06-18 DIAGNOSIS — E119 Type 2 diabetes mellitus without complications: Secondary | ICD-10-CM

## 2017-06-18 LAB — POCT CBC
Granulocyte percent: 68.5 %G (ref 37–80)
HCT, POC: 44.5 % (ref 43.5–53.7)
HEMOGLOBIN: 14.8 g/dL (ref 14.1–18.1)
LYMPH, POC: 2.7 (ref 0.6–3.4)
MCH: 27.4 pg (ref 27–31.2)
MCHC: 33.3 g/dL (ref 31.8–35.4)
MCV: 82.2 fL (ref 80–97)
MID (cbc): 0.3 (ref 0–0.9)
MPV: 7.5 fL (ref 0–99.8)
PLATELET COUNT, POC: 254 10*3/uL (ref 142–424)
POC Granulocyte: 6.6 (ref 2–6.9)
POC LYMPH PERCENT: 27.9 %L (ref 10–50)
POC MID %: 3.6 %M (ref 0–12)
RBC: 5.41 M/uL (ref 4.69–6.13)
RDW, POC: 13.5 %
WBC: 9.6 10*3/uL (ref 4.6–10.2)

## 2017-06-18 LAB — GLUCOSE, POCT (MANUAL RESULT ENTRY): POC GLUCOSE: 347 mg/dL — AB (ref 70–99)

## 2017-06-18 LAB — POCT GLYCOSYLATED HEMOGLOBIN (HGB A1C): HEMOGLOBIN A1C: 12.8

## 2017-06-18 MED ORDER — CEPHALEXIN 500 MG PO CAPS: 500.0000 mg | ORAL_CAPSULE | Freq: Four times a day (QID) | ORAL | 0 refills | Status: DC

## 2017-06-18 MED ORDER — METFORMIN HCL 1000 MG PO TABS: 1000.0000 mg | ORAL_TABLET | Freq: Two times a day (BID) | ORAL | 1 refills | Status: DC

## 2017-06-18 MED ORDER — GLIPIZIDE 5 MG PO TABS: 5.0000 mg | ORAL_TABLET | Freq: Every day | ORAL | 1 refills | Status: DC

## 2017-06-18 NOTE — Patient Instructions (Addendum)
Please take medication as prescribed.    Cellulitis, Adult Cellulitis is a skin infection. The infected area is usually red and sore. This condition occurs most often in the arms and lower legs. It is very important to get treated for this condition. Follow these instructions at home:  Take over-the-counter and prescription medicines only as told by your doctor.  If you were prescribed an antibiotic medicine, take it as told by your doctor. Do not stop taking the antibiotic even if you start to feel better.  Drink enough fluid to keep your pee (urine) clear or pale yellow.  Do not touch or rub the infected area.  Raise (elevate) the infected area above the level of your heart while you are sitting or lying down.  Place warm or cold wet cloths (warm or cold compresses) on the infected area. Do this as told by your doctor.  Keep all follow-up visits as told by your doctor. This is important. These visits let your doctor make sure your infection is not getting worse. Contact a doctor if:  You have a fever.  Your symptoms do not get better after 1-2 days of treatment.  Your bone or joint under the infected area starts to hurt after the skin has healed.  Your infection comes back. This can happen in the same area or another area.  You have a swollen bump in the infected area.  You have new symptoms.  You feel ill and also have muscle aches and pains. Get help right away if:  Your symptoms get worse.  You feel very sleepy.  You throw up (vomit) or have watery poop (diarrhea) for a long time.  There are red streaks coming from the infected area.  Your red area gets larger.  Your red area turns darker. This information is not intended to replace advice given to you by your health care provider. Make sure you discuss any questions you have with your health care provider. Document Released: 10/28/2007 Document Revised: 10/17/2015 Document Reviewed: 03/20/2015 Elsevier  Interactive Patient Education  2018 [employer]   IF you received an x-ray today, you will receive an invoice from [facility]. Please contact [facility] at [phone] with questions or concerns regarding your invoice.   IF you received labwork today, you will receive an invoice from [facility]. Please contact [facility] at [phone] with questions or concerns regarding your invoice.   Our billing staff will not be able to assist you with questions regarding bills from these companies.  You will be contacted with the lab results as soon as they are available. The fastest way to get your results is to activate your My Chart account. Instructions are located on the last page of this paperwork. If you have not heard from us regarding the results in 2 weeks, please contact this office.

## 2017-06-18 NOTE — Progress Notes (Signed)
[facility] [address], [place] [phone]  Date:  06/18/2017   Name:  [name]   DOB:  10/21/1980   MRN:  [id number]  PCP:  Patient, No Pcp Per    History of Present Illness:  [name] is a 37 y.o. male patient who presents to PCP with  Chief Complaint  Patient presents with  . Rash    lower left leg/ redness/ pt states he has had this before. x 2 days     Lower leg with redness started 2 days ago.  Leg swelling.  He noticed red splotches.   No sob.  There is pain with ambulation.  The skin is taute.  He feels more pain with pressure.   Patient Active Problem List   Diagnosis Date Noted  . Essential hypertension 09/03/2016    Past Medical History:  Diagnosis Date  . Diabetes mellitus without complication (HCC)   . Hypertension     No past surgical history on file.  Social History   Tobacco Use  . Smoking status: Never Smoker  . Smokeless tobacco: Never Used  Substance Use Topics  . Alcohol use: No    Alcohol/week: 0.0 oz  . Drug use: No    No family history on file.  No Known Allergies  Medication list has been reviewed and updated.  Current Outpatient Medications on File Prior to Visit  Medication Sig Dispense Refill  . glipiZIDE (GLUCOTROL) 5 MG tablet Take 1 tablet (5 mg total) by mouth daily before breakfast. 90 tablet 3  . lisinopril-hydrochlorothiazide (ZESTORETIC) 20-12.5 MG tablet Take 1 tablet by mouth daily. 90 tablet 1  . metFORMIN (GLUCOPHAGE) 1000 MG tablet Take 1 tablet (1,000 mg total) by mouth 2 (two) times daily with a meal. 180 tablet 3   No current facility-administered medications on file prior to visit.     ROS ROS otherwise unremarkable unless listed above.  Physical Examination: BP 140/90   Pulse (!) 103   Temp 98.6 F (37 C) (Oral)   Resp 16   Ht 6\' 3"  (1.905 m)   Wt (!) 374 lb (169.6 kg)   SpO2 97%   BMI 46.75 kg/m  Ideal Body Weight: Weight in (lb) to have BMI = 25:  199.6  Physical Exam  Constitutional: He is oriented to person, place, and time. He appears well-developed and well-nourished. No distress.  HENT:  Head: Normocephalic and atraumatic.  Eyes: Conjunctivae and EOM are normal. Pupils are equal, round, and reactive to light.  Cardiovascular: Normal rate.  Pulmonary/Chest: Effort normal. No respiratory distress.  Neurological: He is alert and oriented to person, place, and time.  Skin: Skin is warm and dry. He is not diaphoretic.  Left lower extremity with anterior tenderness and superficial erythema and swelling at the posterior lower extremity.    Psychiatric: He has a normal mood and affect. His behavior is normal.    Results for orders placed or performed in visit on 06/18/17  POCT glucose (manual entry)  Result Value Ref Range   POC Glucose 347 (A) 70 - 99 mg/dl  POCT glycosylated hemoglobin (Hb A1C)  Result Value Ref Range   Hemoglobin A1C 12.8   POCT CBC  Result Value Ref Range   WBC 9.6 4.6 - 10.2 K/uL   Lymph, poc 2.7 0.6 - 3.4   POC LYMPH PERCENT 27.9 10 - 50 %L   MID (cbc) 0.3 0 - 0.9   POC MID % 3.6  0 - 12 %M   POC Granulocyte 6.6 2 - 6.9   Granulocyte percent 68.5 37 - 80 %G   RBC 5.41 4.69 - 6.13 M/uL   Hemoglobin 14.8 14.1 - 18.1 g/dL   HCT, POC 78.2 95.6 - 53.7 %   MCV 82.2 80 - 97 fL   MCH, POC 27.4 27 - 31.2 pg   MCHC 33.3 31.8 - 35.4 g/dL   RDW, POC 21.3 %   Platelet Count, POC 254 142 - 424 K/uL   MPV 7.5 0 - 99.8 fL    Assessment and Plan: [name] is a 37 y.o. male who is here today for cc of  Chief Complaint  Patient presents with  . Rash    lower left leg/ redness/ pt states he has had this before. x 2 days  --patient was restarted on his diabetic medication.  Discussed the risk and complications (such as this cellulitis) when not under better control and compliance. --started on keflex qid.  He will follow up in 5 days for recheck.  Alarming sxs discussed to warrant an immediate return.    Type 2 diabetes mellitus without complication, without long-term current use of insulin (HCC) - Plan: metFORMIN (GLUCOPHAGE) 1000 MG tablet  Cellulitis, unspecified cellulitis site - Plan: Basic metabolic panel, DISCONTINUED: cephALEXin (KEFLEX) 500 MG capsule  Pain and swelling of left lower leg - Plan: POCT glucose (manual entry), POCT glycosylated hemoglobin (Hb A1C), POCT CBC, DISCONTINUED: cephALEXin (KEFLEX) 500 MG capsule, CANCELED: CBC  Uncontrolled type 2 diabetes mellitus without complication, without long-term current use of insulin (HCC) - Plan: glipiZIDE (GLUCOTROL) 5 MG tablet  [name], PA-C [facility] 2/8/20198:11 AM

## 2017-06-19 LAB — BASIC METABOLIC PANEL
BUN/Creatinine Ratio: 12 (ref 9–20)
BUN: 9 mg/dL (ref 6–20)
CHLORIDE: 93 mmol/L — AB (ref 96–106)
CO2: 24 mmol/L (ref 20–29)
CREATININE: 0.75 mg/dL — AB (ref 0.76–1.27)
Calcium: 9.2 mg/dL (ref 8.7–10.2)
GFR calc Af Amer: 135 mL/min/{1.73_m2} (ref >59)
GFR calc non Af Amer: 116 mL/min/{1.73_m2} (ref >59)
GLUCOSE: 339 mg/dL — AB (ref 65–99)
Potassium: 4.2 mmol/L (ref 3.5–5.2)
SODIUM: 135 mmol/L (ref 134–144)

## 2017-06-21 NOTE — Progress Notes (Signed)
LETTER SENT

## 2017-06-23 ENCOUNTER — Ambulatory Visit: Payer: Self-pay | Admitting: Physician Assistant

## 2017-06-23 ENCOUNTER — Encounter: Payer: Self-pay | Admitting: Physician Assistant

## 2017-06-23 VITALS — BP 142/90 | HR 99 | Temp 98.1°F | Resp 18 | Ht 75.0 in | Wt 375.0 lb

## 2017-06-23 DIAGNOSIS — M7989 Other specified soft tissue disorders: Secondary | ICD-10-CM

## 2017-06-23 DIAGNOSIS — M79662 Pain in left lower leg: Secondary | ICD-10-CM

## 2017-06-23 DIAGNOSIS — L0390 Cellulitis, unspecified: Secondary | ICD-10-CM

## 2017-06-23 NOTE — Progress Notes (Signed)
PRIMARY CARE AT [facility] [address] [place] [address] [phone]  Date:  06/23/2017   Name:  [name]   DOB:  28-Nov-1980   MRN:  [id number]  PCP:  Patient, No Pcp Per    History of Present Illness:  [name] is a 37 y.o. male patient who presents to PCP with  Chief Complaint  Patient presents with  . Rash    Lower left leg. follow up     Patient is here for follow up of his left leg swelling and pain. 5 days ago he was treated for cellulitis. Also recognzed that he was not taking his blood sugar medications, and was promptly restarted.  Placed on kelfex qid for 10 days.  Today he reports that the leg is doing a lot better.  It is less warm painful or red.  He has been taking the keflex compliantly.  No fever.  Restarted diabetes medicaiton.    Patient Active Problem List   Diagnosis Date Noted  . Essential hypertension 09/03/2016    Past Medical History:  Diagnosis Date  . Diabetes mellitus without complication (HCC)   . Hypertension     History reviewed. No pertinent surgical history.  Social History   Tobacco Use  . Smoking status: Never Smoker  . Smokeless tobacco: Never Used  Substance Use Topics  . Alcohol use: No    Alcohol/week: 0.0 oz  . Drug use: No    History reviewed. No pertinent family history.  No Known Allergies  Medication list has been reviewed and updated.  Current Outpatient Medications on File Prior to Visit  Medication Sig Dispense Refill  . cephALEXin (KEFLEX) 500 MG capsule Take 1 capsule (500 mg total) by mouth 4 (four) times daily. 40 capsule 0  . glipiZIDE (GLUCOTROL) 5 MG tablet Take 1 tablet (5 mg total) by mouth daily before breakfast. 90 tablet 1  . metFORMIN (GLUCOPHAGE) 1000 MG tablet Take 1 tablet (1,000 mg total) by mouth 2 (two) times daily with a meal. 180 tablet 1  . lisinopril-hydrochlorothiazide (ZESTORETIC) 20-12.5 MG tablet Take 1 tablet by mouth daily. (Patient not taking: Reported on  06/23/2017) 90 tablet 1   No current facility-administered medications on file prior to visit.     ROS ROS otherwise unremarkable unless listed above.  Physical Examination: BP (!) 142/90   Pulse 99   Temp 98.1 F (36.7 C) (Oral)   Resp 18   Ht 6\' 3"  (1.905 m)   Wt (!) 375 lb (170.1 kg)   SpO2 98%   BMI 46.87 kg/m  Ideal Body Weight: Weight in (lb) to have BMI = 25: 199.6  Physical Exam  Constitutional: He is oriented to person, place, and time. He appears well-developed and well-nourished. No distress.  HENT:  Head: Normocephalic and atraumatic.  Eyes: Conjunctivae and EOM are normal. Pupils are equal, round, and reactive to light.  Cardiovascular: Normal rate.  Pulmonary/Chest: Effort normal. No respiratory distress.  Neurological: He is alert and oriented to person, place, and time.  Skin: Skin is warm and dry. He is not diaphoretic.  Lower leg with very mildly erythematous.  Mildly tender at the distal area of the calf with superficial swelling tenderness.    Psychiatric: He has a normal mood and affect. His behavior is normal.     Assessment and Plan: [name] is a 37 y.o. male who is here today for cc of  Chief Complaint  Patient presents with  . Rash  Lower left leg. follow up  advised to return in 5 additional days.  I will recheck his blood sugar at that time as well.    Cellulitis, unspecified cellulitis site  Pain and swelling of left lower leg  Trena PlattStephanie Casara Perrier, PA-C [facility] 1/30/20191:29 PM

## 2017-06-23 NOTE — Patient Instructions (Signed)
     IF you received an x-ray today, you will receive an invoice from [facility]. Please contact [facility] at [phone] with questions or concerns regarding your invoice.   IF you received labwork today, you will receive an invoice from [facility]. Please contact [facility] at [phone] with questions or concerns regarding your invoice.   Our billing staff will not be able to assist you with questions regarding bills from these companies.  You will be contacted with the lab results as soon as they are available. The fastest way to get your results is to activate your My Chart account. Instructions are located on the last page of this paperwork. If you have not heard from us regarding the results in 2 weeks, please contact this office.     

## 2017-06-28 ENCOUNTER — Ambulatory Visit (INDEPENDENT_AMBULATORY_CARE_PROVIDER_SITE_OTHER): Payer: Self-pay | Admitting: Physician Assistant

## 2017-06-28 ENCOUNTER — Encounter: Payer: Self-pay | Admitting: Physician Assistant

## 2017-06-28 VITALS — BP 142/80 | HR 92 | Temp 98.4°F | Resp 18 | Ht 75.0 in | Wt 378.0 lb

## 2017-06-28 DIAGNOSIS — E11628 Type 2 diabetes mellitus with other skin complications: Secondary | ICD-10-CM

## 2017-06-28 DIAGNOSIS — L0390 Cellulitis, unspecified: Secondary | ICD-10-CM

## 2017-06-28 DIAGNOSIS — M7989 Other specified soft tissue disorders: Secondary | ICD-10-CM

## 2017-06-28 DIAGNOSIS — M79662 Pain in left lower leg: Secondary | ICD-10-CM

## 2017-06-28 LAB — GLUCOSE, POCT (MANUAL RESULT ENTRY): POC GLUCOSE: 158 mg/dL — AB (ref 70–99)

## 2017-06-28 MED ORDER — CEPHALEXIN 500 MG PO CAPS
500.0000 mg | ORAL_CAPSULE | Freq: Four times a day (QID) | ORAL | 0 refills | Status: DC
Start: 2017-06-28 — End: 2017-07-30

## 2017-06-28 NOTE — Progress Notes (Signed)
PRIMARY CARE AT [facility] [address], [place] [id number] [phone]  Date:  06/28/2017   Name:  [name]   DOB:  02/11/1981   MRN:  [id number]  PCP:  Patient, No Pcp Per    History of Present Illness:  [name] is a 37 y.o. male patient who presents to PCP with  Chief Complaint  Patient presents with  . Cellulitis    follow up/ pt has not finish kelfex, but leg is getting better.     Patient is here for follow up of cellulitis of lower left leg.  Patient notes significant improvement of his pain.  He has less pain, the redness has resolved.  He continues to have some pain at the back of his leg, when he touches it.  He is able to ambulate fine.     Patient Active Problem List   Diagnosis Date Noted  . Essential hypertension 09/03/2016    Past Medical History:  Diagnosis Date  . Diabetes mellitus without complication (HCC)   . Hypertension     History reviewed. No pertinent surgical history.  Social History   Tobacco Use  . Smoking status: Never Smoker  . Smokeless tobacco: Never Used  Substance Use Topics  . Alcohol use: No    Alcohol/week: 0.0 oz  . Drug use: No    History reviewed. No pertinent family history.  No Known Allergies  Medication list has been reviewed and updated.  Current Outpatient Medications on File Prior to Visit  Medication Sig Dispense Refill  . cephALEXin (KEFLEX) 500 MG capsule Take 1 capsule (500 mg total) by mouth 4 (four) times daily. 40 capsule 0  . glipiZIDE (GLUCOTROL) 5 MG tablet Take 1 tablet (5 mg total) by mouth daily before breakfast. 90 tablet 1  . metFORMIN (GLUCOPHAGE) 1000 MG tablet Take 1 tablet (1,000 mg total) by mouth 2 (two) times daily with a meal. 180 tablet 1   No current facility-administered medications on file prior to visit.     ROS ROS otherwise unremarkable unless listed above.  Physical Examination: BP (!) 142/80   Pulse 92   Temp 98.4 F (36.9 C) (Oral)   Resp 18   Ht  6\' 3"  (1.905 m)   Wt (!) 378 lb (171.5 kg)   SpO2 98%   BMI 47.25 kg/m  Ideal Body Weight: Weight in (lb) to have BMI = 25: 199.6  Physical Exam  Constitutional: He is oriented to person, place, and time. He appears well-developed and well-nourished. No distress.  HENT:  Head: Normocephalic and atraumatic.  Eyes: Conjunctivae and EOM are normal. Pupils are equal, round, and reactive to light.  Cardiovascular: Normal rate.  Pulmonary/Chest: Effort normal. No respiratory distress.  Musculoskeletal:  Normal rom of the left lower leg.  He has minimal tenderness at the lower area of the posterior leg just beneath the calf.  Redness is not present.    Neurological: He is alert and oriented to person, place, and time.  Skin: Skin is warm and dry. He is not diaphoretic.  Psychiatric: He has a normal mood and affect. His behavior is normal.   Results for orders placed or performed in visit on 06/28/17  POCT glucose (manual entry)  Result Value Ref Range   POC Glucose 158 (A) 70 - 99 mg/dl   Assessment and Plan: [name] is a 37 y.o. male who is here today for cc of  Chief Complaint  Patient presents with  .  Cellulitis    follow up/ pt has not finish kelfex, but leg is getting better.   Type 2 diabetes mellitus with other skin complication, without long-term current use of insulin (HCC) - Plan: POCT glucose (manual entry), cephALEXin (KEFLEX) 500 MG capsule  [name], PA-C [facility] 2/12/20197:54 AM

## 2017-06-28 NOTE — Patient Instructions (Addendum)
Start taking a baby aspirin daily. I would like you to take an additional 4 days totalling to 14 days of abx at this time.   You will follow up with me in 10 days for recheck.  If you are 100% I would like you to follow up in 09/16/2017 for recheck of your a1c.  We should check this every 3 months  Diabetes Mellitus and Nutrition When you have diabetes (diabetes mellitus), it is very important to have healthy eating habits because your blood sugar (glucose) levels are greatly affected by what you eat and drink. Eating healthy foods in the appropriate amounts, at about the same times every day, can help you:  Control your blood glucose.  Lower your risk of heart disease.  Improve your blood pressure.  Reach or maintain a healthy weight.  Every person with diabetes is different, and each person has different needs for a meal plan. Your health care provider may recommend that you work with a diet and nutrition specialist (dietitian) to make a meal plan that is best for you. Your meal plan may vary depending on factors such as:  The calories you need.  The medicines you take.  Your weight.  Your blood glucose, blood pressure, and cholesterol levels.  Your activity level.  Other health conditions you have, such as heart or kidney disease.  How do carbohydrates affect me? Carbohydrates affect your blood glucose level more than any other type of food. Eating carbohydrates naturally increases the amount of glucose in your blood. Carbohydrate counting is a method for keeping track of how many carbohydrates you eat. Counting carbohydrates is important to keep your blood glucose at a healthy level, especially if you use insulin or take certain oral diabetes medicines. It is important to know how many carbohydrates you can safely have in each meal. This is different for every person. Your dietitian can help you calculate how many carbohydrates you should have at each meal and for snack. Foods that  contain carbohydrates include:  Bread, cereal, rice, pasta, and crackers.  Potatoes and corn.  Peas, beans, and lentils.  Milk and yogurt.  Fruit and juice.  Desserts, such as cakes, cookies, ice cream, and candy.  How does alcohol affect me? Alcohol can cause a sudden decrease in blood glucose (hypoglycemia), especially if you use insulin or take certain oral diabetes medicines. Hypoglycemia can be a life-threatening condition. Symptoms of hypoglycemia (sleepiness, dizziness, and confusion) are similar to symptoms of having too much alcohol. If your health care provider says that alcohol is safe for you, follow these guidelines:  Limit alcohol intake to no more than 1 drink per day for nonpregnant women and 2 drinks per day for men. One drink equals 12 oz of beer, 5 oz of wine, or 1 oz of hard liquor.  Do not drink on an empty stomach.  Keep yourself hydrated with water, diet soda, or unsweetened iced tea.  Keep in mind that regular soda, juice, and other mixers may contain a lot of sugar and must be counted as carbohydrates.  What are tips for following this plan? Reading food labels  Start by checking the serving size on the label. The amount of calories, carbohydrates, fats, and other nutrients listed on the label are based on one serving of the food. Many foods contain more than one serving per package.  Check the total grams (g) of carbohydrates in one serving. You can calculate the number of servings of carbohydrates in one serving by  dividing the total carbohydrates by 15. For example, if a food has 30 g of total carbohydrates, it would be equal to 2 servings of carbohydrates.  Check the number of grams (g) of saturated and trans fats in one serving. Choose foods that have low or no amount of these fats.  Check the number of milligrams (mg) of sodium in one serving. Most people should limit total sodium intake to less than 2,300 mg per day.  Always check the nutrition  information of foods labeled as "low-fat" or "nonfat". These foods may be higher in added sugar or refined carbohydrates and should be avoided.  Talk to your dietitian to identify your daily goals for nutrients listed on the label. Shopping  Avoid buying canned, premade, or processed foods. These foods tend to be high in fat, sodium, and added sugar.  Shop around the outside edge of the grocery store. This includes fresh fruits and vegetables, bulk grains, fresh meats, and fresh dairy. Cooking  Use low-heat cooking methods, such as baking, instead of high-heat cooking methods like deep frying.  Cook using healthy oils, such as olive, canola, or sunflower oil.  Avoid cooking with butter, cream, or high-fat meats. Meal planning  Eat meals and snacks regularly, preferably at the same times every day. Avoid going long periods of time without eating.  Eat foods high in fiber, such as fresh fruits, vegetables, beans, and whole grains. Talk to your dietitian about how many servings of carbohydrates you can eat at each meal.  Eat 4-6 ounces of lean protein each day, such as lean meat, chicken, fish, eggs, or tofu. 1 ounce is equal to 1 ounce of meat, chicken, or fish, 1 egg, or 1/4 cup of tofu.  Eat some foods each day that contain healthy fats, such as avocado, nuts, seeds, and fish. Lifestyle   Check your blood glucose regularly.  Exercise at least 30 minutes 5 or more days each week, or as told by your health care provider.  Take medicines as told by your health care provider.  Do not use any products that contain nicotine or tobacco, such as cigarettes and e-cigarettes. If you need help quitting, ask your health care provider.  Work with a Veterinary surgeoncounselor or diabetes educator to identify strategies to manage stress and any emotional and social challenges. What are some questions to ask my health care provider?  Do I need to meet with a diabetes educator?  Do I need to meet with a  dietitian?  What number can I call if I have questions?  When are the best times to check my blood glucose? Where to find more information:  American Diabetes Association: diabetes.org/food-and-fitness/food  Academy of Nutrition and Dietetics: https://www.vargas.com/www.eatright.org/resources/health/diseases-and-conditions/diabetes  [employer] Institute of Diabetes and Digestive and Kidney Diseases (NIH): FindJewelers.czwww.niddk.nih.gov/health-information/diabetes/overview/diet-eating-physical-activity Summary  A healthy meal plan will help you control your blood glucose and maintain a healthy lifestyle.  Working with a diet and nutrition specialist (dietitian) can help you make a meal plan that is best for you.  Keep in mind that carbohydrates and alcohol have immediate effects on your blood glucose levels. It is important to count carbohydrates and to use alcohol carefully. This information is not intended to replace advice given to you by your health care provider. Make sure you discuss any questions you have with your health care provider. Document Released: 02/05/2005 Document Revised: 06/15/2016 Document Reviewed: 06/15/2016 Elsevier Interactive Patient Education  [employer]2018 Elsevier Inc.     IF you received an x-ray today, you  will receive an invoice from [name]. Please contact [facility] at [phone] with questions or concerns regarding your invoice.   IF you received labwork today, you will receive an invoice from [facility]. Please contact [facility] at [phone] with questions or concerns regarding your invoice.   Our billing staff will not be able to assist you with questions regarding bills from these companies.  You will be contacted with the lab results as soon as they are available. The fastest way to get your results is to activate your My Chart account. Instructions are located on the last page of this paperwork. If you have not heard from [name] regarding the results in 2 weeks, please  contact this office.

## 2017-07-06 ENCOUNTER — Telehealth: Payer: Self-pay | Admitting: Physician Assistant

## 2017-07-06 NOTE — Telephone Encounter (Signed)
Please advise pt that his blood sugar was 158 when he was in the office which has significantly improved.  Continue to take your medication.  Remember to follow up if this has not resolved (around 2/14).  Otherwise we follow up in late may to recheck your a1c.

## 2017-07-06 NOTE — Telephone Encounter (Signed)
Pt notified and verbalized understanding.

## 2017-07-30 ENCOUNTER — Encounter (HOSPITAL_COMMUNITY): Payer: Self-pay

## 2017-07-30 ENCOUNTER — Ambulatory Visit: Payer: Self-pay | Admitting: Physician Assistant

## 2017-07-30 ENCOUNTER — Encounter: Payer: Self-pay | Admitting: Physician Assistant

## 2017-07-30 ENCOUNTER — Other Ambulatory Visit: Payer: Self-pay

## 2017-07-30 ENCOUNTER — Emergency Department (HOSPITAL_COMMUNITY)
Admission: EM | Admit: 2017-07-30 | Discharge: 2017-07-31 | Disposition: A | Discharge status: Home / Self Care | Payer: Self-pay | Attending: Emergency Medicine | Admitting: Emergency Medicine

## 2017-07-30 ENCOUNTER — Emergency Department (HOSPITAL_COMMUNITY): Payer: Self-pay

## 2017-07-30 VITALS — BP 132/86 | HR 99 | Temp 98.5°F | Resp 18 | Ht 75.0 in | Wt 377.6 lb

## 2017-07-30 DIAGNOSIS — E66813 Obesity, class 3: Secondary | ICD-10-CM

## 2017-07-30 DIAGNOSIS — Z7984 Long term (current) use of oral hypoglycemic drugs: Secondary | ICD-10-CM | POA: Insufficient documentation

## 2017-07-30 DIAGNOSIS — F329 Major depressive disorder, single episode, unspecified: Secondary | ICD-10-CM

## 2017-07-30 DIAGNOSIS — M7989 Other specified soft tissue disorders: Secondary | ICD-10-CM

## 2017-07-30 DIAGNOSIS — E119 Type 2 diabetes mellitus without complications: Secondary | ICD-10-CM

## 2017-07-30 DIAGNOSIS — I10 Essential (primary) hypertension: Secondary | ICD-10-CM | POA: Insufficient documentation

## 2017-07-30 DIAGNOSIS — L03031 Cellulitis of right toe: Secondary | ICD-10-CM | POA: Insufficient documentation

## 2017-07-30 DIAGNOSIS — M79662 Pain in left lower leg: Secondary | ICD-10-CM | POA: Insufficient documentation

## 2017-07-30 DIAGNOSIS — I872 Venous insufficiency (chronic) (peripheral): Secondary | ICD-10-CM

## 2017-07-30 DIAGNOSIS — B353 Tinea pedis: Secondary | ICD-10-CM

## 2017-07-30 DIAGNOSIS — F32A Depression, unspecified: Secondary | ICD-10-CM

## 2017-07-30 DIAGNOSIS — E11621 Type 2 diabetes mellitus with foot ulcer: Secondary | ICD-10-CM | POA: Insufficient documentation

## 2017-07-30 LAB — CBC WITH DIFFERENTIAL/PLATELET
BASOS ABS: 0 10*3/uL (ref 0.0–0.1)
BASOS PCT: 0 %
EOS PCT: 1 %
Eosinophils Absolute: 0.1 10*3/uL (ref 0.0–0.7)
HCT: 43.2 % (ref 39.0–52.0)
Hemoglobin: 14.7 g/dL (ref 13.0–17.0)
Lymphocytes Relative: 36 %
Lymphs Abs: 3.6 10*3/uL (ref 0.7–4.0)
MCH: 28.1 pg (ref 26.0–34.0)
MCHC: 34 g/dL (ref 30.0–36.0)
MCV: 82.6 fL (ref 78.0–100.0)
MONO ABS: 0.7 10*3/uL (ref 0.1–1.0)
Monocytes Relative: 7 %
Neutro Abs: 5.8 10*3/uL (ref 1.7–7.7)
Neutrophils Relative %: 56 %
PLATELETS: 279 10*3/uL (ref 150–400)
RBC: 5.23 MIL/uL (ref 4.22–5.81)
RDW: 13.4 % (ref 11.5–15.5)
WBC: 10.2 10*3/uL (ref 4.0–10.5)

## 2017-07-30 LAB — COMPREHENSIVE METABOLIC PANEL
ALBUMIN: 4 g/dL (ref 3.5–5.0)
ALK PHOS: 80 U/L (ref 38–126)
ALT: 29 U/L (ref 17–63)
AST: 32 U/L (ref 15–41)
Anion gap: 10 (ref 5–15)
BILIRUBIN TOTAL: 0.6 mg/dL (ref 0.3–1.2)
BUN: 9 mg/dL (ref 6–20)
CALCIUM: 9.4 mg/dL (ref 8.9–10.3)
CO2: 24 mmol/L (ref 22–32)
Chloride: 103 mmol/L (ref 101–111)
Creatinine, Ser: 0.72 mg/dL (ref 0.61–1.24)
GFR calc Af Amer: >60 mL/min (ref >60)
GLUCOSE: 209 mg/dL — AB (ref 65–99)
Potassium: 4 mmol/L (ref 3.5–5.1)
Sodium: 137 mmol/L (ref 135–145)
TOTAL PROTEIN: 8.2 g/dL — AB (ref 6.5–8.1)

## 2017-07-30 IMAGING — DX DG FOOT COMPLETE 3+V*R*
3 series · 3 of 3 positions shown · non-contrast
Comparison: None.

CLINICAL DATA: Initial evaluation for subcutaneous gas

EXAM:
RIGHT FOOT COMPLETE - 3+ VIEW

[foot ap]
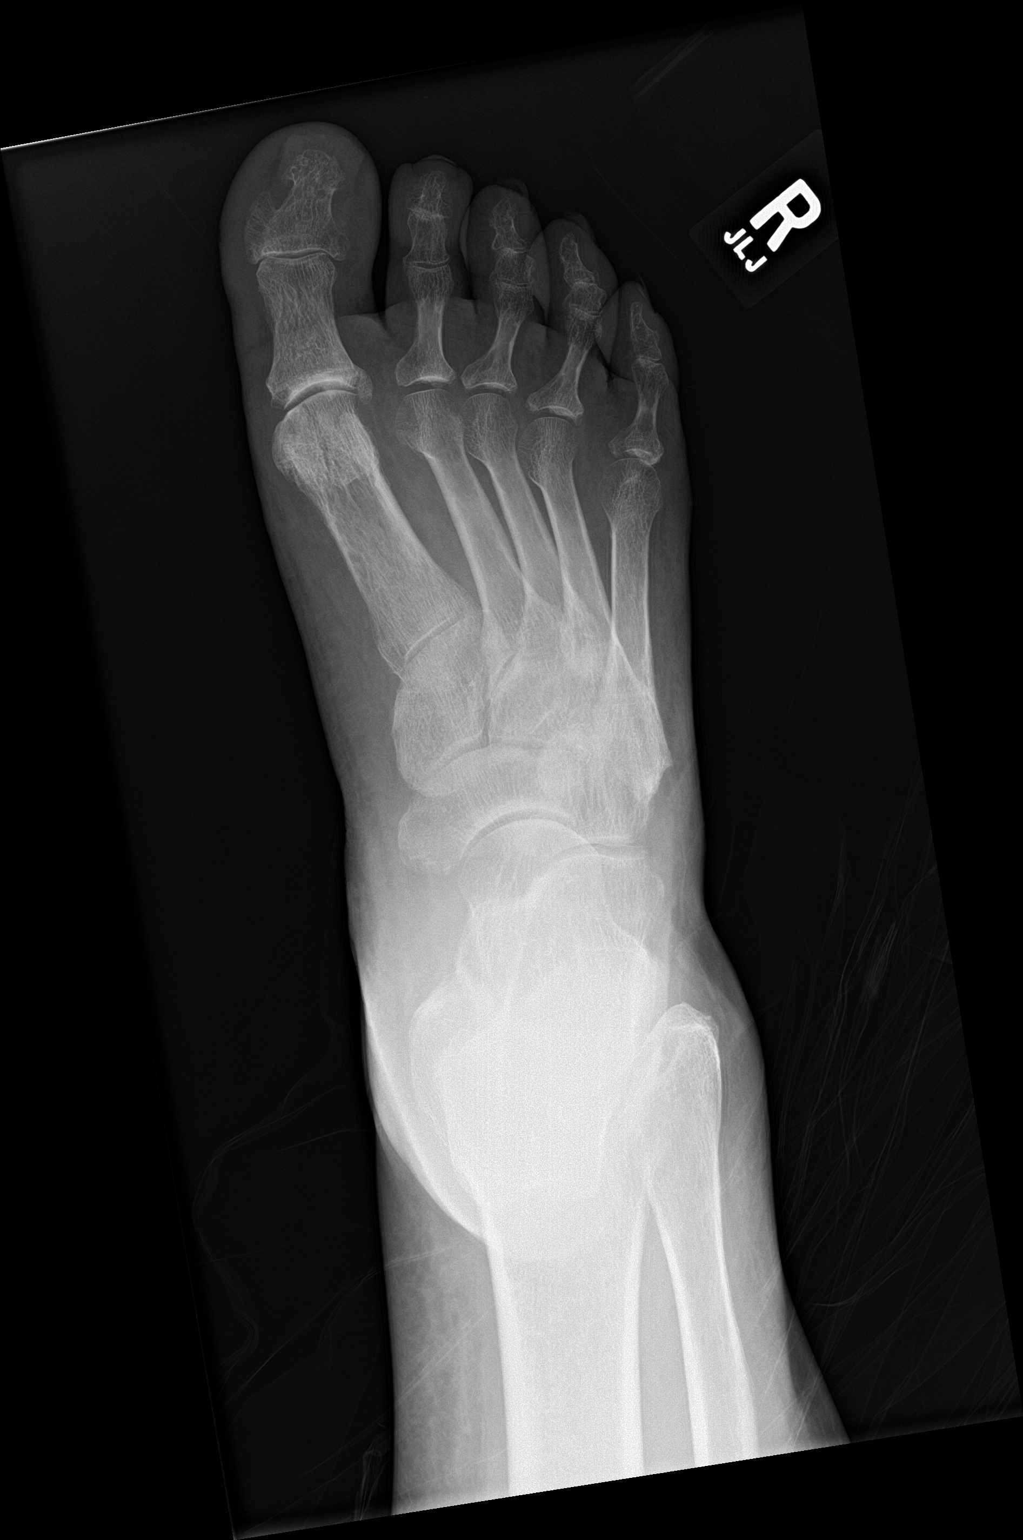

[foot obl]
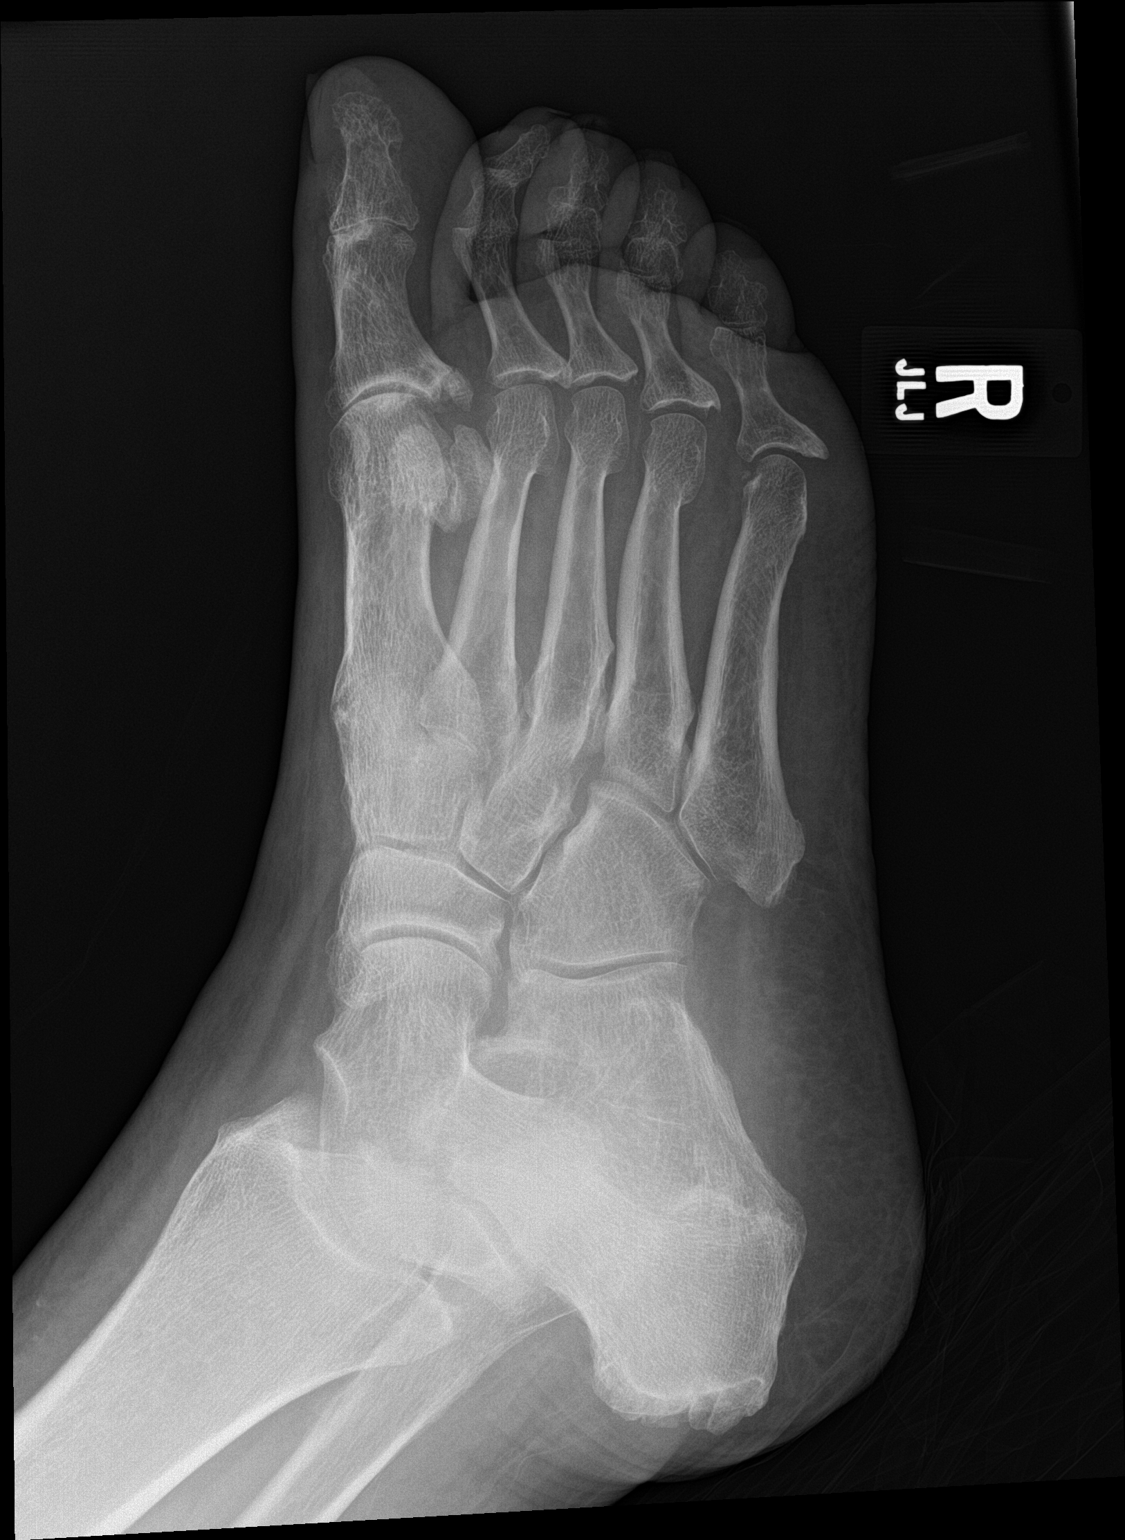

[foot lat]
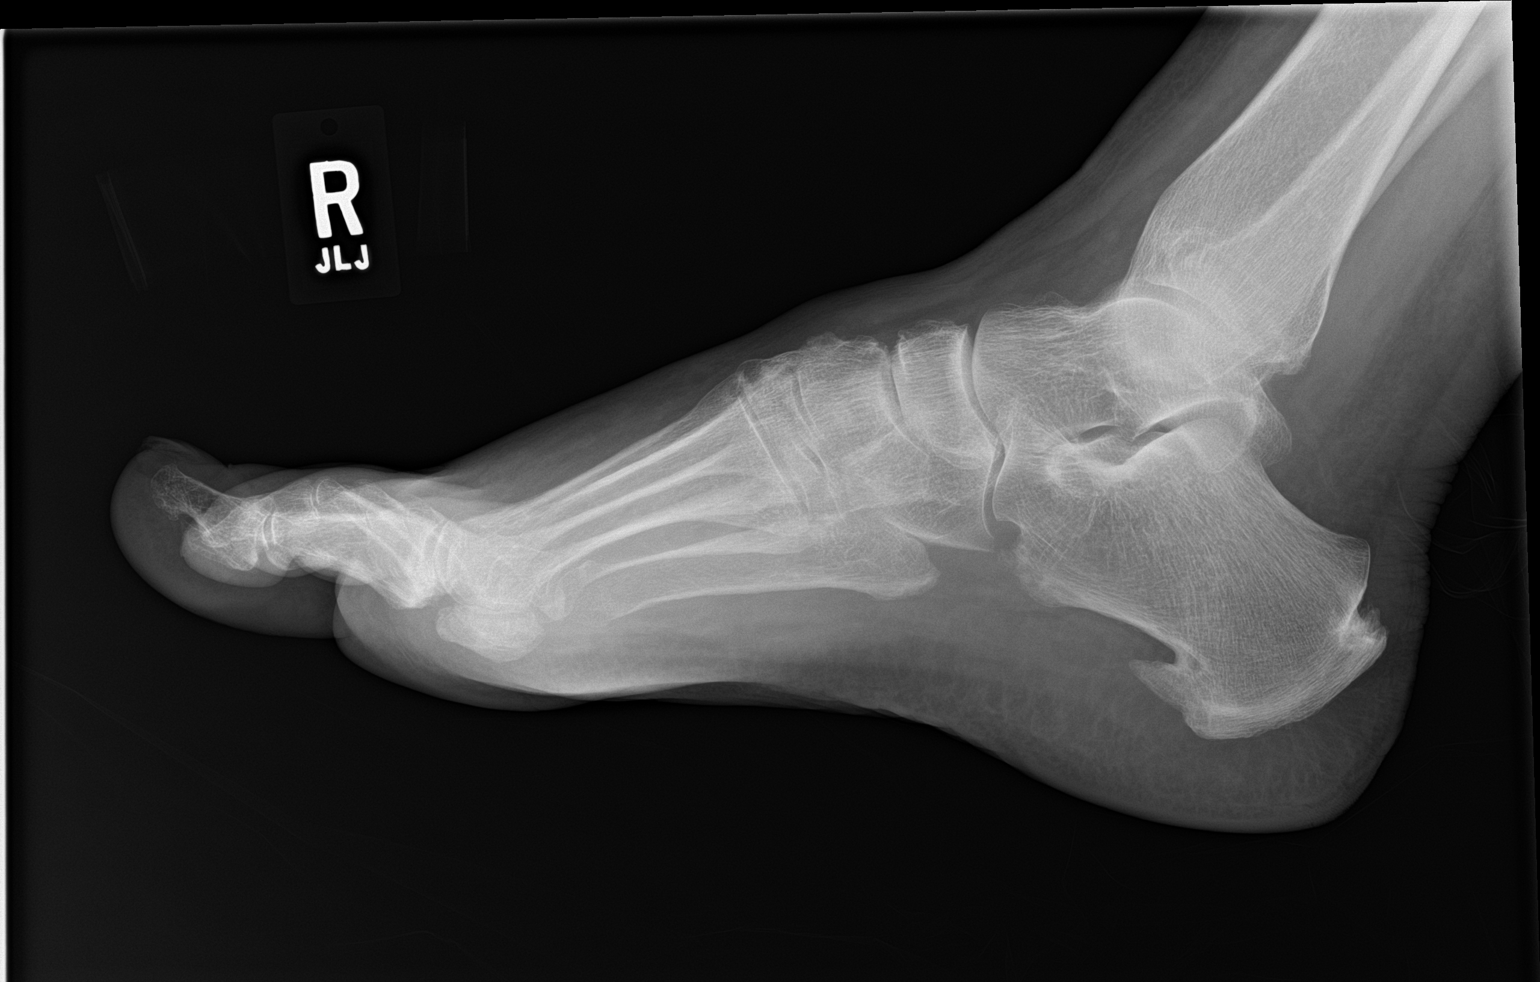

[3 of 3 positions shown; findings below may reference images not displayed]

FINDINGS: No acute fracture or dislocation. Moderate degenerative
osteoarthritic changes throughout the right foot, most notable at
the first MTP joint. Prominent posterior plantar calcaneal
enthesophytes. Degenerative spurring present at the dorsal midfoot.
No acute soft tissue swelling. No dissecting soft tissue emphysema.
No radiopaque foreign body.
IMPRESSION: 1. No soft tissue emphysema identified within the right foot.
2. No acute osseous abnormality identified.
3. Moderate degenerative osteoarthritic changes throughout the right
foot.

## 2017-07-30 IMAGING — DX DG FOOT COMPLETE 3+V*L*
3 series · 3 of 3 positions shown · non-contrast
Comparison: None.

CLINICAL DATA: Initial evaluation for subcutaneous gas.

EXAM:
LEFT FOOT - COMPLETE 3+ VIEW

[foot ap]
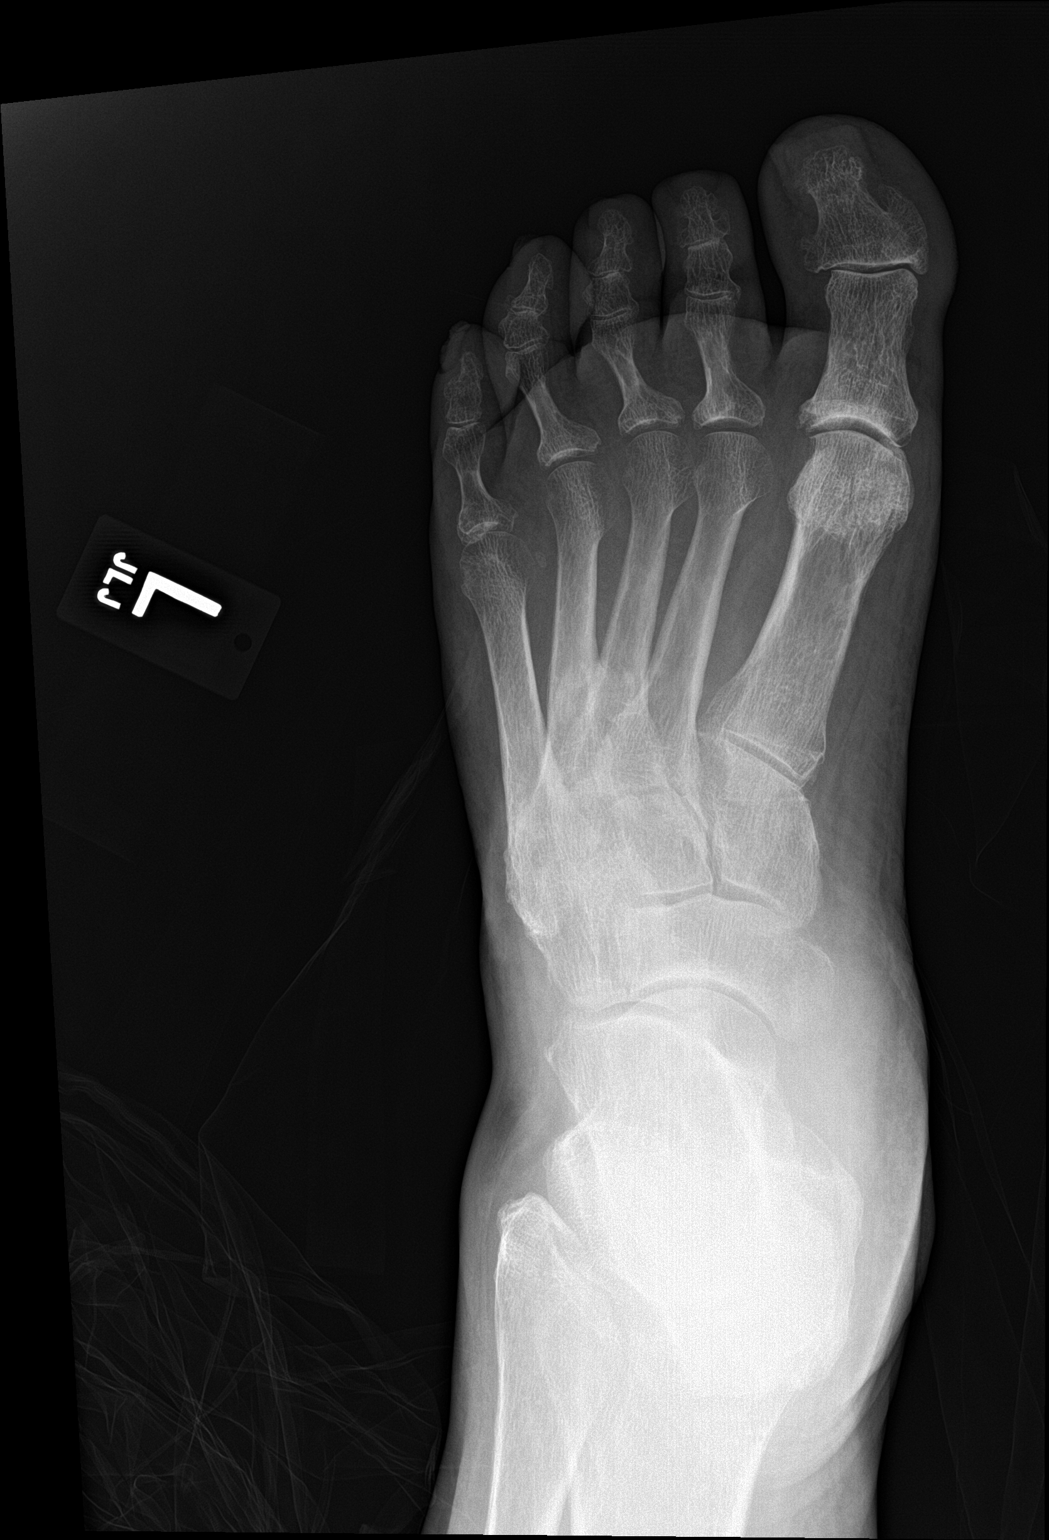

[foot obl]
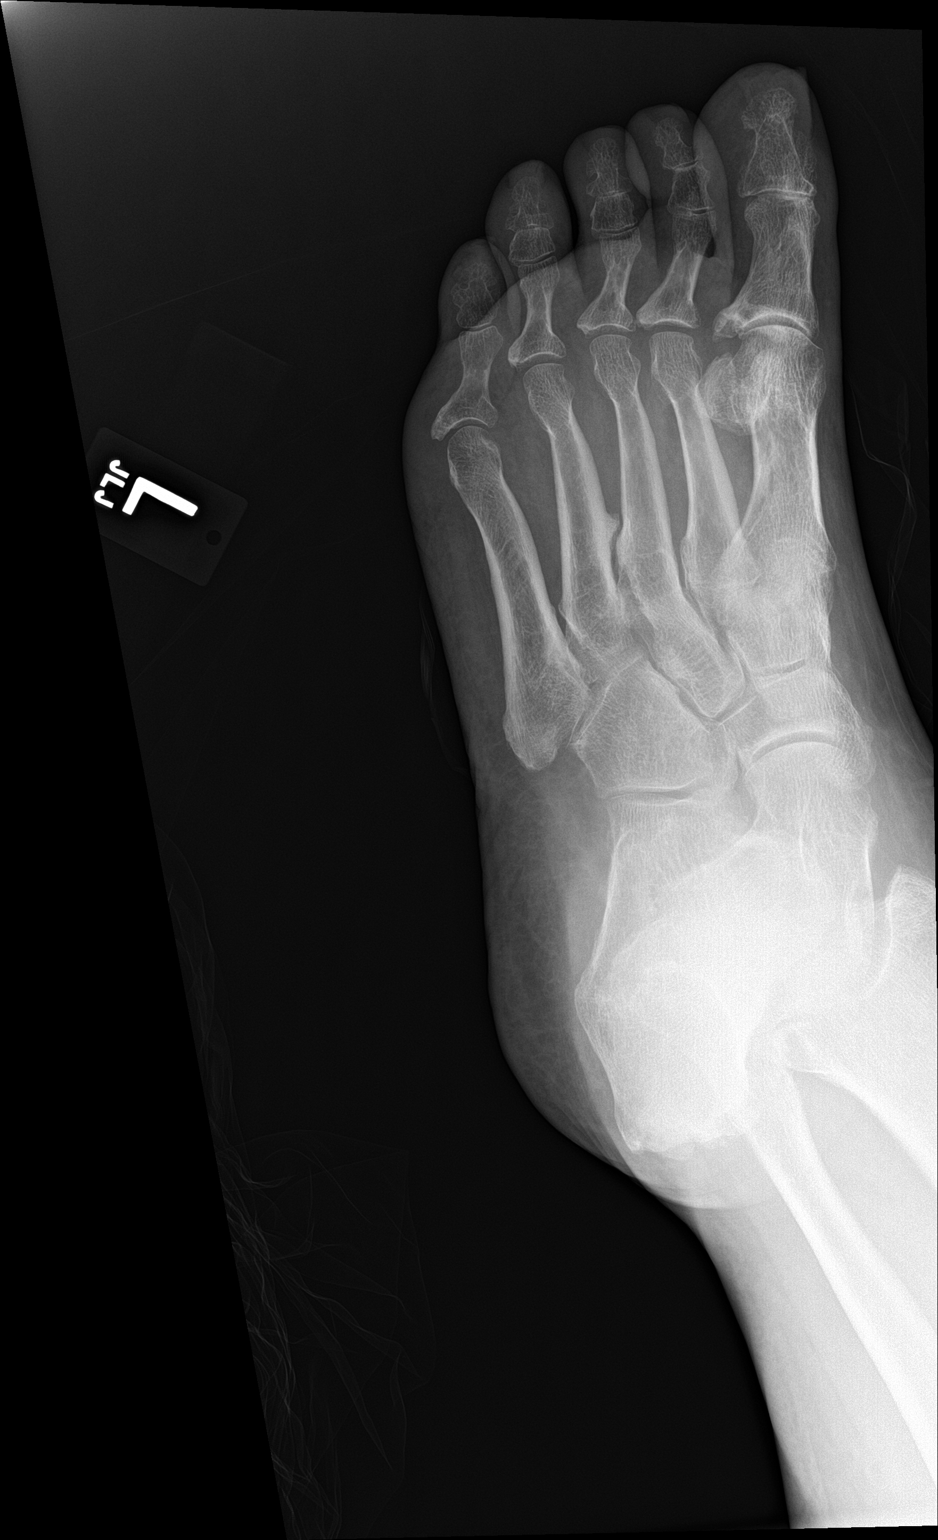

[foot lat]
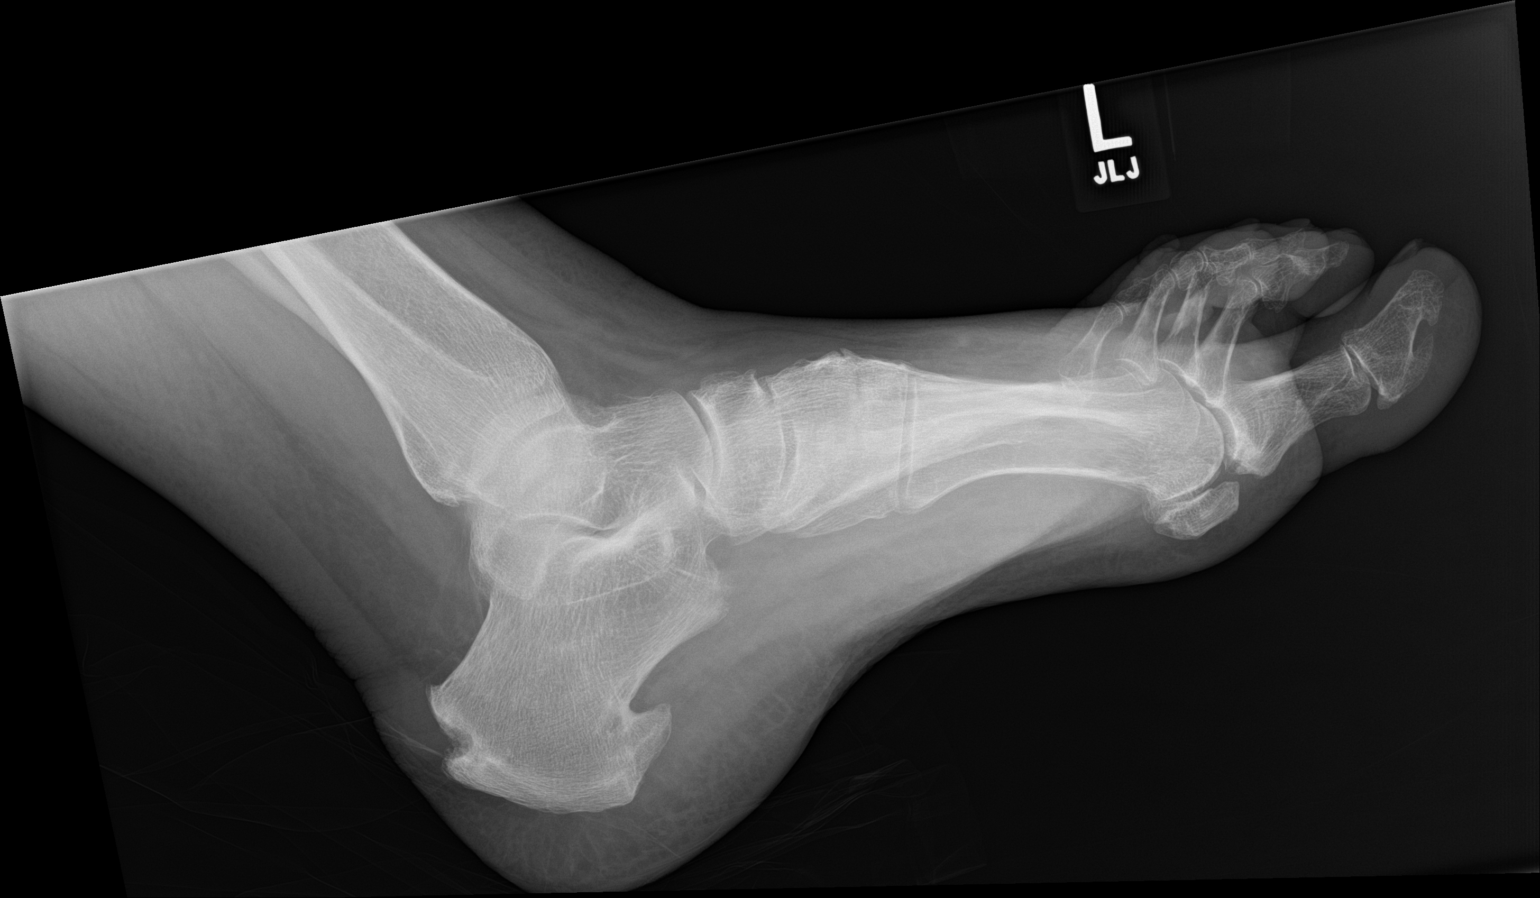

[3 of 3 positions shown; findings below may reference images not displayed]

FINDINGS: No acute fracture or dislocation. Scattered degenerative
osteoarthritic changes present throughout the left foot, most
notable at the first MTP joint. Osseous spur noted at the fourth
metatarsal shaft. No acute soft tissue abnormality. No dissecting
soft tissue emphysema. No radiopaque foreign body identified.
IMPRESSION: 1. No soft tissue emphysema identified within the left foot.
2. No acute osseous abnormality identified.
3. Moderate degenerative osteoarthrosis throughout the left foot.

## 2017-07-30 MED ORDER — CEPHALEXIN 500 MG PO CAPS: 500.0000 mg | ORAL_CAPSULE | Freq: Four times a day (QID) | ORAL | 0 refills | Status: AC

## 2017-07-30 MED ORDER — ENOXAPARIN SODIUM 300 MG/3ML IJ SOLN: 1.0000 mg/kg | Freq: Once | INTRAMUSCULAR | Status: DC

## 2017-07-30 MED ORDER — ENOXAPARIN SODIUM 300 MG/3ML IJ SOLN
180.0000 mg | Freq: Once | INTRAMUSCULAR | Status: AC
  Administered 2017-07-30: 180 mg via SUBCUTANEOUS
  Filled 2017-07-30: qty 1.8

## 2017-07-30 NOTE — Patient Instructions (Addendum)
Please go to [facility] hospital to be evaluated for your leg pain.  Please make sure you see social worker to help you with your bills before you leave the ED.  [facility]  If you would like to look into financial assistance options for your healthcare, you can call the [facility] Financial Aid Office at [phone].     IF you received an x-ray today, you will receive an invoice from [facility]. Please contact [facility] at [phone] with questions or concerns regarding your invoice.   IF you received labwork today, you will receive an invoice from [facility]. Please contact [facility] at [phone] with questions or concerns regarding your invoice.   Our billing staff will not be able to assist you with questions regarding bills from these companies.  You will be contacted with the lab results as soon as they are available. The fastest way to get your results is to activate your My Chart account. Instructions are located on the last page of this paperwork. If you have not heard from us regarding the results in 2 weeks, please contact this office.

## 2017-07-30 NOTE — ED Provider Notes (Signed)
[facility]-EMERGENCY DEPT Provider Note   CSN: [id number] Arrival date & time: 07/30/17  1803     History   Chief Complaint Chief Complaint  Patient presents with  . Toe Pain    R  . Possible DVT    HPI [name] is a 37 y.o. male with PMH/o DM, HTN who presents for evaluation of left lower leg pain that has been ongoing for the last few days.  Patient reports that he started feeling some tenderness to the posterior aspect of his left foot/leg.  He reports that then he started having some subjective fever and chills.  Patient states that he has a history of cellulitis to that leg and presumed that he was beginning to have some cellulitis.  Patient reports that he has not had any redness to the leg.  He went to urgent care today for evaluation of symptoms and was prompted to go to the emergency department for further evaluation.  Patient also reports that he has been having some right toe pain.  He states that he has had a small area of scabbing there the does not recall the initial injury.  He has noticed some worsening swelling and redness around the area.  Patient states that he had a history of a DVT several years ago in his early 55s.  He denies any recent steroid use, immobilization, surgeries, long travel.  Patient denies any chest pain, difficulty breathing, abdominal pain, numbness/weakness, nausea/vomiting.  The history is provided by the patient.    Past Medical History:  Diagnosis Date  . Diabetes mellitus without complication (HCC)   . Hypertension     Patient Active Problem List   Diagnosis Date Noted  . Diabetes mellitus without complication (HCC) 07/30/2017  . Tinea pedis 07/30/2017  . Venous insufficiency of both lower extremities 07/30/2017  . Obesity, Class III, BMI 40-49.9 (morbid obesity) (HCC) 07/30/2017  . Essential hypertension 09/03/2016    History reviewed. No pertinent surgical history.     Home Medications    Prior  to Admission medications   Medication Sig Start Date End Date Taking? Authorizing Provider  glipiZIDE (GLUCOTROL) 5 MG tablet Take 1 tablet (5 mg total) by mouth daily before breakfast. 06/18/17  Yes English, Stephanie D, PA  metFORMIN (GLUCOPHAGE) 1000 MG tablet Take 1 tablet (1,000 mg total) by mouth 2 (two) times daily with a meal. 06/18/17  Yes English, Stephanie D, PA  Multiple Vitamins-Minerals (MULTIVITAMIN ADULT PO) Take 1 tablet by mouth daily.   Yes [provider]  cephALEXin (KEFLEX) 500 MG capsule Take 1 capsule (500 mg total) by mouth 4 (four) times daily for 7 days. 07/30/17 08/06/17  Maxwell Caul, PA-C    Family History Family History  Problem Relation Age of Onset  . Diabetes Mother   . Hyperlipidemia Father   . Hypertension Father     Social History Social History   Tobacco Use  . Smoking status: Never Smoker  . Smokeless tobacco: Never Used  Substance Use Topics  . Alcohol use: No    Alcohol/week: 0.0 oz  . Drug use: No     Allergies   Milk-related compounds   Review of Systems Review of Systems  Constitutional: Negative for chills and fever.  HENT: Negative for congestion.   Eyes: Negative for visual disturbance.  Respiratory: Negative for cough and shortness of breath.   Cardiovascular: Negative for chest pain.  Gastrointestinal: Negative for abdominal pain, diarrhea, nausea and vomiting.  Genitourinary:  Negative for dysuria and hematuria.  Musculoskeletal: Negative for back pain and neck pain.       LLE pain  Skin: Positive for color change and wound. Negative for rash.  Neurological: Negative for dizziness, weakness, numbness and headaches.  Psychiatric/Behavioral: Negative for confusion.  All other systems reviewed and are negative.    Physical Exam Updated Vital Signs BP 115/72   Pulse 95   Temp 98.4 F (36.9 C) (Oral)   Resp 20   Wt (!) 174.4 kg (384 lb 8 oz)   SpO2 98%   BMI 48.06 kg/m   Physical Exam  Constitutional:  He is oriented to person, place, and time. He appears well-developed and well-nourished.  Sitting comfortably on examination table  HENT:  Head: Normocephalic and atraumatic.  Mouth/Throat: Oropharynx is clear and moist and mucous membranes are normal.  Eyes: Conjunctivae, EOM and lids are normal. Pupils are equal, round, and reactive to light.  Neck: Full passive range of motion without pain.  Cardiovascular: Normal rate, regular rhythm, normal heart sounds and normal pulses. Exam reveals no gallop and no friction rub.  No murmur heard. Pulses:      Dorsalis pedis pulses are 2+ on the right side, and 2+ on the left side.  Pulmonary/Chest: Effort normal and breath sounds normal.  No evidence of respiratory distress. Able to speak in full sentences without difficulty.  Abdominal: Soft. Normal appearance. There is no tenderness. There is no rigidity and no guarding.  Musculoskeletal: Normal range of motion.  Diffuse tenderness to the posterior aspect of LLE.  Left lower extremity slightly larger than the right lower extremity.  There is some diffuse erythema noted to the dorsal aspect of the left foot.  No warmth, induration.  Tenderness palpation to the right first toe.  There is a small superficial scab noted to the lateral aspect of the right first toe.  No deformity or crepitus.  No tenderness palpation to right calf.  Neurological: He is alert and oriented to person, place, and time.  Follows commands, Moves all extremities  5/5 strength to BUE and BLE  Sensation intact throughout all major nerve distributions  Skin: Skin is warm and dry. Capillary refill takes less than 2 seconds.  Good distal cap refill.  BLE is not dusky in appearance or cool to touch.  Psychiatric: He has a normal mood and affect. His speech is normal.  Nursing note and vitals reviewed.    ED Treatments / Results  Labs (all labs ordered are listed, but only abnormal results are displayed) Labs Reviewed    COMPREHENSIVE METABOLIC PANEL - Abnormal; Notable for the following components:      Result Value   Glucose, Bld 209 (*)    Total Protein 8.2 (*)    All other components within normal limits  CBC WITH DIFFERENTIAL/PLATELET    EKG  EKG Interpretation None       Radiology Dg Foot Complete Left  Result Date: 07/30/2017 CLINICAL DATA:  Initial evaluation for subcutaneous gas. EXAM: LEFT FOOT - COMPLETE 3+ VIEW COMPARISON:  None. FINDINGS: No acute fracture or dislocation. Scattered degenerative osteoarthritic changes present throughout the left foot, most notable at the first MTP joint. Osseous spur noted at the fourth metatarsal shaft. No acute soft tissue abnormality. No dissecting soft tissue emphysema. No radiopaque foreign body identified. IMPRESSION: 1. No soft tissue emphysema identified within the left foot. 2. No acute osseous abnormality identified. 3. 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 Electronically Signed  By: Rise Mu M.D.   On: 07/30/2017 22:08   Dg Foot Complete Right  Result Date: 07/30/2017 CLINICAL DATA:  Initial evaluation for subcutaneous gas EXAM: RIGHT FOOT COMPLETE - 3+ VIEW COMPARISON:  None. FINDINGS: No acute fracture or dislocation. Moderate degenerative osteoarthritic changes throughout the right foot, most notable at the first MTP joint. Prominent posterior plantar calcaneal enthesophytes. Degenerative spurring present at the dorsal midfoot. No acute soft tissue swelling. No dissecting soft tissue emphysema. No radiopaque foreign body. IMPRESSION: 1. No soft tissue emphysema identified within the right foot. 2. No acute osseous abnormality identified. 3. Moderate degenerative osteoarthritic changes throughout the right foot. Electronically Signed   By: Rise Mu M.D.   On: 07/30/2017 22:12    Procedures Procedures (including critical care time)  Medications Ordered in ED Medications  enoxaparin (LOVENOX)  injection 174 mg (not administered)  enoxaparin (LOVENOX) injection 180 mg (not administered)     Initial Impression / Assessment and Plan / ED Course  I have reviewed the triage vital signs and the nursing notes.  Pertinent labs & imaging results that were available during my care of the patient were reviewed by me and considered in my medical decision making (see chart for details).     37 year old male who presents for evaluation of left lower extremity pain.  States that he had some tenderness to the posterior aspect of left lower extremity.  No redness, swelling.  Patient reports symptoms of feet subjective fever and chills a few days ago.  Patient has a history of cellulitis to left lower extremity and thought that he may be ready to develop cellulitis.  Went to urgent care today and was evaluated and prompted to go the ED for evaluation of DVT to left lower extremity.  Additionally he reports a wound to the right first toe associate with some pain, redness, swelling.  Patient reports that his blood sugars range somewhere between 150s-300s.  He states that he will sometimes forget to take his medication which is when his sugar goes up to the 300s.  Patient denies any chest pain, difficulty breathing. Patient is afebrile, non-toxic appearing, sitting comfortably on examination table. Vital signs reviewed and stable.  On exam, left lower extremity slightly larger than right lower extremity.  There is some diffuse erythema noted to the dorsal aspect of the foot.  No warmth, erythema.  On right lower extremity, he does have some redness and swelling to the right first toe with a superficial scab noted.  No deformity or crepitus noted.  Consider DVT versus cellulitis versus wound infection.  We will plan to check basic labs, x-ray.  CBC is without any significant leukocytosis, anemia, thrombocytopenia.  CMP shows hyperglycemia but otherwise unremarkable.  X-ray of right foot shows no evidence of  subcutaneous emphysema that would be concerning for osteomyelitis.  X-ray of left lower extremity is negative.  Given concern about cellulitis to right toe, will plan to start patient on antibiotic therapy.  Additionally will plan to send an outpatient referral for podiatry.  Left lower extremity still concerning for DVT.  As there is no ultrasound in the department tonight, will plan to give patient a prophylactic dose of Lovenox here in the ED.  We will schedule patient for an ultrasound tomorrow morning for evaluation of left lower extremity DVT.  I discussed plan with patient and he is agreeable.  He is instructed to go to the hospital tomorrow morning for ultrasound evaluation.  He expresses understanding of this  plan.  Vital signs are stable.  Patient denies any chest pain, difficulty breathing. Patient had ample opportunity for questions and discussion. All patient's questions were answered with full understanding. Strict return precautions discussed. Patient expresses understanding and agreement to plan.    Final Clinical Impressions(s) / ED Diagnoses   Final diagnoses:  Cellulitis of toe of right foot  Pain and swelling of left lower leg    ED Discharge Orders        Ordered    LE VENOUS     07/30/17 2239    cephALEXin (KEFLEX) 500 MG capsule  4 times daily     07/30/17 2241       Maxwell Caul, PA-C 07/31/17 0304    Bethann Berkshire, MD 08/03/17 [phone]

## 2017-07-30 NOTE — ED Notes (Signed)
Assessed patient, states right great toe edematous and reddened frequently. Not particularly painful currently, just reddened. Left posterior calf edematous per pt, no pain per patient. Bilateral pedal pulses 2+. Awaiting MD assessment for orders.

## 2017-07-30 NOTE — ED Triage Notes (Addendum)
Pt reports that his Dr told him to come here to r/o a DVT in his L lower leg. Pt does have reddened skin in his lower L leg. It is not hot to touch, but he states that it is a little tender. He states that he does often get cellulitis in that area, but this is lasting longer than that usually does. Also reports R sided toe pain and possible infection. A&Ox4. Hx DM2. No chest pain or SOB.

## 2017-07-30 NOTE — Discharge Instructions (Signed)
As we discussed, you will return to [facility] tomorrow morning for an ultrasound to evaluate for possible blood clot in her left lower leg.  Go to the main entrance of the hospital and they will direct you further.  If your ultrasound is positive, they will direct her treatment.  If it is negative, continue taking the antibiotics as directed.  Follow-up at the [facility] clinic the next 24-48 hours for further evaluation.  Continue taking antibiotics as directed.  Please complete your finish course.  I have also provided a referral to our foot doctor.  Please follow-up with them regarding the wound on your toe.  Return to the emergency department for any worsening pain, chest pain, difficulty breathing, worsening redness or swelling of the legs or any other worsening or concerning symptoms.

## 2017-07-30 NOTE — Progress Notes (Addendum)
Subjective:    Patient ID: [name], male    DOB: 02/25/1981, 37 y.o.   MRN: [id number]   Chief Complaint  Patient presents with  . leg infection    left leg  . Toe Pain    big toe on right foot is acting funny     HPI Patient presents for what he thinks is a leg infection x 1.5 weeks. Patient states that he started running a fever and had chills when the leg infection began 1.5 weeks ago but it resolved - he had subjective fevers and chills again a few nights ago but none since then. The fever and chills were only present for one day both episodes. Patient states that his leg is not particularly painful, but that there is tenderness that feels like "bruising" on the back on his leg. He has had no trauma to the leg Patient states that his "big toe" on his right foot is red, painful and swollen. Pain in right great toe is exacerbated by any type of movement, including "pressing on the gas peddle."    Patient was initially seen for cellulitis of his left leg on 06/18/2017. At this time it was treated with Keflex. He was seen for a follow up for cellulitis of the left leg on 06/23/2017.Patient was followed up on 06/28/2017, at which time his Keflex was extended for 10 more days. Patient said that leg was 'completely improved' after finishing the 10-day extension of Keflex. The leg was normal until 1.5 weeks ago, when it became swollen again. There is hardly any pain or redness this time. No joint swelling.   Patient has a very sedentary lifestyle - states that he sits for most of the day.  He has had what sounds like a superficial clot in the past but cannot find record of it.  He has DM that is poorly controlled - medications are really expensive for him.  He has not moved since November.  He is not on medications for depression due to cost.    Patient Active Problem List   Diagnosis Date Noted  . Diabetes mellitus without complication (HCC) 07/30/2017  . Tinea pedis 07/30/2017  .  Venous insufficiency of both lower extremities 07/30/2017  . Obesity, Class III, BMI 40-49.9 (morbid obesity) (HCC) 07/30/2017  . Essential hypertension 09/03/2016   Allergies  Allergen Reactions  . Milk-Related Compounds    Prior to Admission medications   Medication Sig Start Date End Date Taking? Authorizing Provider  glipiZIDE (GLUCOTROL) 5 MG tablet Take 1 tablet (5 mg total) by mouth daily before breakfast. 06/18/17  Yes English, Stephanie D, PA  metFORMIN (GLUCOPHAGE) 1000 MG tablet Take 1 tablet (1,000 mg total) by mouth 2 (two) times daily with a meal. 06/18/17  Yes Garnetta BuddyEnglish, Stephanie D, PA   Past Medical History:  Diagnosis Date  . Diabetes mellitus without complication (HCC)   . Hypertension    Social History   Socioeconomic History  . Marital status: Single    Spouse name: Not on file  . Number of children: Not on file  . Years of education: Not on file  . Highest education level: Not on file  Social Needs  . Financial resource strain: Not on file  . Food insecurity - worry: Not on file  . Food insecurity - inability: Not on file  . Transportation needs - medical: Not on file  . Transportation needs - non-medical: Not on file  Occupational History  . Occupation: Unemployed  Tobacco Use  . Smoking status: Never Smoker  . Smokeless tobacco: Never Used  Substance and Sexual Activity  . Alcohol use: No    Alcohol/week: 0.0 oz  . Drug use: No  . Sexual activity: Not on file  Other Topics Concern  . Not on file  Social History Narrative   Live with parents in [place].    Family History  Problem Relation Age of Onset  . Diabetes Mother   . Hyperlipidemia Father   . Hypertension Father    History reviewed. No pertinent surgical history.  Review of Systems  Constitutional: Positive for chills and fever (subjective). Negative for activity change, appetite change, fatigue and unexpected weight change.  HENT: Negative.  Negative for congestion, sinus  pressure and sinus pain.   Eyes: Negative.  Negative for pain and discharge.  Respiratory: Negative.  Negative for cough, chest tightness and shortness of breath.   Cardiovascular: Negative.  Negative for chest pain and palpitations.  Gastrointestinal: Positive for diarrhea. Negative for blood in stool, constipation, nausea and vomiting.  Endocrine: Negative.  Negative for polydipsia and polyuria.  Genitourinary: Negative.  Negative for difficulty urinating, dysuria and frequency.  Musculoskeletal: Positive for myalgias (Behind left leg.). Negative for arthralgias, back pain, gait problem and joint swelling.  Skin: Positive for color change.  Allergic/Immunologic: Negative.   Neurological: Negative.  Negative for dizziness, light-headedness and headaches.       Objective:   Physical Exam  Constitutional: He is oriented to person, place, and time. He appears well-developed and well-nourished.  BP 132/86   Pulse 99   Temp 98.5 F (36.9 C) (Oral)   Resp 18   Ht 6\' 3"  (1.905 m)   Wt (!) 377 lb 9.6 oz (171.3 kg)   SpO2 99%   BMI 47.20 kg/m   HENT:  Head: Normocephalic and atraumatic.  Right Ear: External ear normal.  Left Ear: External ear normal.  Nose: Nose normal.  Mouth/Throat: Oropharynx is clear and moist.  Eyes: Conjunctivae and EOM are normal. Pupils are equal, round, and reactive to light.  Neck: Normal range of motion. Neck supple.  Cardiovascular: Normal rate, regular rhythm, normal heart sounds and intact distal pulses.  Pulses:      Dorsalis pedis pulses are 1+ on the right side, and 2+ on the left side.  Pulmonary/Chest: Effort normal and breath sounds normal.  Abdominal: Soft. Bowel sounds are normal.  Musculoskeletal: Normal range of motion. He exhibits edema (On left leg: pitting edema 2+ to 2/3rd up the shin. Right leg 1+ edema to 2/3rd up shin.).  Right calf circumference: 50.5 cm Left calf circumference: 52.5 cm  Palpable cord on the left calf -- no erythema  on left or right leg  Neurological: He is alert and oriented to person, place, and time. He has normal reflexes.  Skin: Skin is warm and dry. There is erythema (On right great toe.).  Hyperpigmentation, dryness and flaking on skin of left and right leg.  Wound on underside of great right toe appears like callus has been removed - only mild erythema around the callus. Great right toe erythematous and edematous.  All toenails with thickening and yellowing consistent with onychomycosis.  Bilateral feet - peeling, scaling and moist - mild erythema  Psychiatric: His behavior is normal. Judgment and thought content normal. He exhibits a depressed mood (flat affect).       Assessment & Plan:  Pain and swelling of left lower leg - Plan: [facility] to [facility]  Diabetes mellitus without complication (HCC) - Plan: Ambulatory referral to [facility]  Tinea pedis of both feet - Plan: Ambulatory referral to [facility]  Venous insufficiency of both lower extremities - Plan: Ambulatory referral to [facility]  Obesity, Class III, BMI 40-49.9 (morbid obesity) (HCC) - Plan: Ambulatory referral to [facility]  Depression, unspecified depression type - Plan: Ambulatory referral to [facility] - pt needs help with medical expenses.  He is currently not taking medications correctly due to cost issues.  He has no insurance and no  Very concerning of blood clot - pt to ED.  He definitely has cellulitis of right toe likely from skin infection from where he pulled of a callus - he has also significant tinea pedis that needs treatment but until he DM is controlled it will not resolve.  He can use OTC antifungal cream.  We discussed the importance of controlled DM.  He has no insurance and no money - he was instructed to speak to Child psychotherapist while in the ED to get help with medical care and medications - ? Medical medicaid being available to him.  There is no sign of cellulitis on  the left leg at this time.  Benny Lennert PA-C  Primary Care at [facility] 08/03/2017 10:50 AM

## 2017-07-31 ENCOUNTER — Other Ambulatory Visit: Payer: Self-pay

## 2017-07-31 ENCOUNTER — Emergency Department (HOSPITAL_COMMUNITY): Admission: EM | Admit: 2017-07-31 | Payer: Self-pay | Source: Home / Self Care

## 2017-07-31 ENCOUNTER — Ambulatory Visit (HOSPITAL_COMMUNITY)
Admission: RE | Admit: 2017-07-31 | Discharge: 2017-07-31 | Disposition: A | Discharge status: Home / Self Care | Payer: Self-pay | Source: Ambulatory Visit | Attending: Emergency Medicine | Admitting: Emergency Medicine

## 2017-07-31 DIAGNOSIS — M7989 Other specified soft tissue disorders: Secondary | ICD-10-CM

## 2017-07-31 DIAGNOSIS — M79609 Pain in unspecified limb: Secondary | ICD-10-CM

## 2017-07-31 NOTE — Progress Notes (Signed)
VASCULAR LAB PRELIMINARY  PRELIMINARY  PRELIMINARY  PRELIMINARY  Left lower extremity venous duplex completed.    Preliminary report:  There is no DVT or SVT noted in the left lower extremity. Enlarged inguinal lymph nodes noted bilaterally.   Tiajah Oyster, RVT 07/31/2017, 8:12 AM

## 2017-08-03 NOTE — Addendum Note (Signed)
Addended by: Morrell RiddleWEBER, SARAH L on: 08/03/2017 12:44 PM   Modules accepted: Orders

## 2017-08-23 ENCOUNTER — Encounter: Payer: Self-pay | Admitting: Physician Assistant

## 2017-08-27 ENCOUNTER — Ambulatory Visit: Payer: Self-pay | Admitting: Urgent Care

## 2017-08-27 ENCOUNTER — Encounter: Payer: Self-pay | Admitting: Urgent Care

## 2017-08-27 VITALS — BP 137/88 | HR 73 | Temp 99.0°F | Resp 17 | Ht 75.0 in | Wt 380.0 lb

## 2017-08-27 DIAGNOSIS — L03031 Cellulitis of right toe: Secondary | ICD-10-CM

## 2017-08-27 DIAGNOSIS — B353 Tinea pedis: Secondary | ICD-10-CM

## 2017-08-27 DIAGNOSIS — E1165 Type 2 diabetes mellitus with hyperglycemia: Secondary | ICD-10-CM

## 2017-08-27 MED ORDER — CEPHALEXIN 500 MG PO CAPS: 500.0000 mg | ORAL_CAPSULE | Freq: Four times a day (QID) | ORAL | 0 refills | Status: DC

## 2017-08-27 MED ORDER — FLUCONAZOLE 150 MG PO TABS
150.0000 mg | ORAL_TABLET | ORAL | 0 refills | Status: DC
Start: 2017-08-27 — End: 2017-11-13

## 2017-08-27 NOTE — Patient Instructions (Addendum)
Salads - kale, spinach, cabbage, spring mix; use seeds like pumpkin seeds or sunflower seeds; you can also use 1-2 hard boiled eggs. Fruits - avocadoes, berries (blueberries, raspberries, blackberries), apples, oranges, pomegranate, grapefruit Seeds - quinoa, chia seeds; you can also incorporate oatmeal Vegetables - aspargus, cauliflower, broccoli, green beans, brussel spouts, bell peppers; stay away from starchy vegetables like potatoes, carrots, peas     Cellulitis, Adult Cellulitis is a skin infection. The infected area is usually red and tender. This condition occurs most often in the arms and lower legs. The infection can travel to the muscles, blood, and underlying tissue and become serious. It is very important to get treated for this condition. What are the causes? Cellulitis is caused by bacteria. The bacteria enter through a break in the skin, such as a cut, burn, insect bite, open sore, or crack. What increases the risk? This condition is more likely to occur in people who:  Have a weak defense system (immune system).  Have open wounds on the skin such as cuts, burns, bites, and scrapes. Bacteria can enter the body through these open wounds.  Are older.  Have diabetes.  Have a type of long-lasting (chronic) liver disease (cirrhosis) or kidney disease.  Use IV drugs.  What are the signs or symptoms? Symptoms of this condition include:  Redness, streaking, or spotting on the skin.  Swollen area of the skin.  Tenderness or pain when an area of the skin is touched.  Warm skin.  Fever.  Chills.  Blisters.  How is this diagnosed? This condition is diagnosed based on a medical history and physical exam. You may also have tests, including:  Blood tests.  Lab tests.  Imaging tests.  How is this treated? Treatment for this condition may include:  Medicines, such as antibiotic medicines or antihistamines.  Supportive care, such as rest and application of cold  or warm cloths (cold or warm compresses) to the skin.  Hospital care, if the condition is severe.  The infection usually gets better within 1-2 days of treatment. Follow these instructions at home:  Take over-the-counter and prescription medicines only as told by your health care provider.  If you were prescribed an antibiotic medicine, take it as told by your health care provider. Do not stop taking the antibiotic even if you start to feel better.  Drink enough fluid to keep your urine clear or pale yellow.  Do not touch or rub the infected area.  Raise (elevate) the infected area above the level of your heart while you are sitting or lying down.  Apply warm or cold compresses to the affected area as told by your health care provider.  Keep all follow-up visits as told by your health care provider. This is important. These visits let your health care provider make sure a more serious infection is not developing. Contact a health care provider if:  You have a fever.  Your symptoms do not improve within 1-2 days of starting treatment.  Your bone or joint underneath the infected area becomes painful after the skin has healed.  Your infection returns in the same area or another area.  You notice a swollen bump in the infected area.  You develop new symptoms.  You have a general ill feeling (malaise) with muscle aches and pains. Get help right away if:  Your symptoms get worse.  You feel very sleepy.  You develop vomiting or diarrhea that persists.  You notice red streaks coming from the infected  area.  Your red area gets larger or turns dark in color. This information is not intended to replace advice given to you by your health care provider. Make sure you discuss any questions you have with your health care provider. Document Released: 02/18/2005 Document Revised: 09/19/2015 Document Reviewed: 03/20/2015 Elsevier Interactive Patient Education  2018 Elsevier  Inc.     Athlete's Foot Athlete's foot (tinea pedis) is a fungal infection of the skin on the feet. It often occurs on the skin that is between or underneath the toes. It can also occur on the soles of the feet. The infection can spread from person to person (is contagious). What are the causes? Athlete's foot is caused by a fungus. This fungus grows in warm, moist places. Most people get athlete's foot by sharing shower stalls, towels, and wet floors with someone who is infected. Not washing your feet or changing your socks often enough can contribute to athlete's foot. What increases the risk? This condition is more likely to develop in:  Men.  People who have a weak body defense system (immune system).  People who have diabetes.  People who use public showers, such as at a gym.  People who wear heavy-duty shoes, such as Youth worker.  Seasons with warm, humid weather.  What are the signs or symptoms? Symptoms of this condition include:  Itchy areas between the toes or on the soles of the feet.  White, flaky, or scaly areas between the toes or on the soles of the feet.  Very itchy small blisters between the toes or on the soles of the feet.  Small cuts on the skin. These cuts can become infected.  Thick or discolored toenails.  How is this diagnosed? This condition is diagnosed with a medical history and physical exam. Your health care provider may also take a skin or toenail sample to be examined. How is this treated? Treatment for this condition includes antifungal medicines. These may be applied as powders, ointments, or creams. In severe cases, an oral antifungal medicine may be given. Follow these instructions at home:  Apply or take over-the-counter and prescription medicines only as told by your health care provider.  Keep all follow-up visits as told by your health care provider. This is important.  Do not scratch your feet.  Keep your feet  dry: ? Wear cotton or wool socks. Change your socks every day or if they become wet. ? Wear shoes that allow air to circulate, such as sandals or canvas tennis shoes.  Wash and dry your feet: ? Every day or as told by your health care provider. ? After exercising. ? Including the area between your toes.  Do not share towels, nail clippers, or other personal items that touch your feet with others.  If you have diabetes, keep your blood sugar under control. How is this prevented?  Do not share towels.  Wear sandals in wet areas, such as locker rooms and shared showers.  Keep your feet dry: ? Wear cotton or wool socks. Change your socks every day or if they become wet. ? Wear shoes that allow air to circulate, such as sandals or canvas tennis shoes.  Wash and dry your feet after exercising. Pay attention to the area between your toes. Contact a health care provider if:  You have a fever.  You have swelling, soreness, warmth, or redness in your foot.  You are not getting better with treatment.  Your symptoms get worse.  You  have new symptoms. This information is not intended to replace advice given to you by your health care provider. Make sure you discuss any questions you have with your health care provider. Document Released: 05/08/2000 Document Revised: 10/17/2015 Document Reviewed: 11/12/2014 Elsevier Interactive Patient Education  2018 [employer]     IF you received an x-ray today, you will receive an invoice from [facility]. Please contact [facility] at [phone] with questions or concerns regarding your invoice.   IF you received labwork today, you will receive an invoice from [facility]. Please contact [facility] at [phone] with questions or concerns regarding your invoice.   Our billing staff will not be able to assist you with questions regarding bills from these companies.  You will be contacted with the lab results as soon as they  are available. The fastest way to get your results is to activate your My Chart account. Instructions are located on the last page of this paperwork. If you have not heard from us regarding the results in 2 weeks, please contact this office.

## 2017-08-27 NOTE — Progress Notes (Signed)
    MRN: [id number] DOB: 04/27/1981  Subjective:   [name] is a 37 y.o. male presenting for recurring right great toe swelling, redness, intermittent pain. Patient was managed for infection of his foot with Keflex for 1 week and did well. He had an x-ray done, blood work that ruled out osteomyelitis. He has uncontrolled type 2 diabetes and is not compliant with his diet. Denies red streaks, fever, loss of sensation of his foot.   [name] has a current medication list which includes the following prescription(s): glipizide, metformin, and multiple vitamins-minerals. Also is allergic to milk-related compounds.  [name]  has a past medical history of Diabetes mellitus without complication (HCC) and Hypertension. Denies past surgical history.  Objective:   Vitals: BP 137/88   Pulse 73   Temp 99 F (37.2 C) (Oral)   Resp 17   Ht 6\' 3"  (1.905 m)   Wt (!) 380 lb (172.4 kg)   SpO2 98%   BMI 47.50 kg/m   BP Readings from Last 3 Encounters:  08/27/17 137/88  07/30/17 130/82  07/30/17 132/86    Wt Readings from Last 3 Encounters:  08/27/17 (!) 380 lb (172.4 kg)  07/30/17 (!) 384 lb 8 oz (174.4 kg)  07/30/17 (!) 377 lb 9.6 oz (171.3 kg)    Physical Exam  Constitutional: He is oriented to person, place, and time. He appears well-developed and well-nourished.  Cardiovascular: Normal rate.  Pulmonary/Chest: Effort normal.  Musculoskeletal:       Feet:  Neurological: He is alert and oriented to person, place, and time.  Skin: Skin is warm and dry.   Assessment and Plan :   Cellulitis of right toe  Tinea pedis of both feet  Uncontrolled type 2 diabetes mellitus with hyperglycemia (HCC)  Will start 10 day course of Keflex and 6 week course of diflucan. Renal function is intact. Discussed need for better diabetes control as this is the source of his recurrent infections. Counseled patient on need for insulin but he does not want to use this yet. He would like to try and  make diet changes. He will try to follow up in 2 days for a recheck.  Wallis BambergMario Alaa Mullally, PA-C [facility] [phone] 08/27/2017  12:13 PM

## 2017-11-13 ENCOUNTER — Encounter: Payer: Self-pay | Admitting: Emergency Medicine

## 2017-11-13 ENCOUNTER — Ambulatory Visit: Payer: Self-pay | Admitting: Emergency Medicine

## 2017-11-13 ENCOUNTER — Other Ambulatory Visit: Payer: Self-pay

## 2017-11-13 VITALS — BP 140/88 | HR 93 | Temp 98.6°F | Resp 16 | Ht 75.0 in | Wt 378.2 lb

## 2017-11-13 DIAGNOSIS — L02214 Cutaneous abscess of groin: Secondary | ICD-10-CM

## 2017-11-13 MED ORDER — DOXYCYCLINE HYCLATE 100 MG PO TABS
100.0000 mg | ORAL_TABLET | Freq: Two times a day (BID) | ORAL | 0 refills | Status: DC
Start: 2017-11-13 — End: 2018-01-25

## 2017-11-13 NOTE — Patient Instructions (Addendum)
     IF you received an x-ray today, you will receive an invoice from [facility]. Please contact [facility] at [phone] with questions or concerns regarding your invoice.   IF you received labwork today, you will receive an invoice from [facility]. Please contact [facility] at [phone] with questions or concerns regarding your invoice.   Our billing staff will not be able to assist you with questions regarding bills from these companies.  You will be contacted with the lab results as soon as they are available. The fastest way to get your results is to activate your My Chart account. Instructions are located on the last page of this paperwork. If you have not heard from us regarding the results in 2 weeks, please contact this office.     Skin Abscess A skin abscess is an infected area on or under your skin that contains pus and other material. An abscess can happen almost anywhere on your body. Some abscesses break open (rupture) on their own. Most continue to get worse unless they are treated. The infection can spread deeper into the body and into your blood, which can make you feel sick. Treatment usually involves draining the abscess. Follow these instructions at home: Abscess Care  If you have an abscess that has not drained, place a warm, clean, wet washcloth over the abscess several times a day. Do this as told by your doctor.  Follow instructions from your doctor about how to take care of your abscess. Make sure you: ? Cover the abscess with a bandage (dressing). ? Change your bandage or gauze as told by your doctor. ? Wash your hands with soap and water before you change the bandage or gauze. If you cannot use soap and water, use hand sanitizer.  Check your abscess every day for signs that the infection is getting worse. Check for: ? More redness, swelling, or pain. ? More fluid or blood. ? Warmth. ? More pus or a bad smell. Medicines   Take  over-the-counter and prescription medicines only as told by your doctor.  If you were prescribed an antibiotic medicine, take it as told by your doctor. Do not stop taking the antibiotic even if you start to feel better. General instructions  To avoid spreading the infection: ? Do not share personal care items, towels, or hot tubs with others. ? Avoid making skin-to-skin contact with other people.  Keep all follow-up visits as told by your doctor. This is important. Contact a doctor if:  You have more redness, swelling, or pain around your abscess.  You have more fluid or blood coming from your abscess.  Your abscess feels warm when you touch it.  You have more pus or a bad smell coming from your abscess.  You have a fever.  Your muscles ache.  You have chills.  You feel sick. Get help right away if:  You have very bad (severe) pain.  You see red streaks on your skin spreading away from the abscess. This information is not intended to replace advice given to you by your health care provider. Make sure you discuss any questions you have with your health care provider. Document Released: 10/28/2007 Document Revised: 01/05/2016 Document Reviewed: 03/20/2015 Elsevier Interactive Patient Education  2018 Elsevier Inc.  

## 2017-11-13 NOTE — Progress Notes (Signed)
[name] 37 y.o.   Chief Complaint  Patient presents with  . cyst in groin area left side    noticed on monday 11/08/17 with some swelling, opened up and drained clear drainage with white mucus on tuesday, now fluid is clear and brown.  Per pt he has a spot down there that is concerning.  Pain level 4/10.  Pain started on yesterday while driving to run an errand, driving is painful and unpleasant    HISTORY OF PRESENT ILLNESS: This is a 37 y.o. male complaining of left groin abscess that started draining couple days ago.  HPI   Prior to Admission medications   Medication Sig Start Date End Date Taking? Authorizing Provider  glipiZIDE (GLUCOTROL) 5 MG tablet Take 1 tablet (5 mg total) by mouth daily before breakfast. 06/18/17  Yes English, Stephanie D, PA  metFORMIN (GLUCOPHAGE) 1000 MG tablet Take 1 tablet (1,000 mg total) by mouth 2 (two) times daily with a meal. 06/18/17  Yes English, Stephanie D, PA  Multiple Vitamins-Minerals (MULTIVITAMIN ADULT PO) Take 1 tablet by mouth daily.   Yes [provider]    Allergies  Allergen Reactions  . Milk-Related Compounds     Patient Active Problem List   Diagnosis Date Noted  . Cellulitis of right toe 08/27/2017  . Diabetes mellitus without complication (HCC) 07/30/2017  . Tinea pedis 07/30/2017  . Venous insufficiency of both lower extremities 07/30/2017  . Obesity, Class III, BMI 40-49.9 (morbid obesity) (HCC) 07/30/2017  . Essential hypertension 09/03/2016    Past Medical History:  Diagnosis Date  . Diabetes mellitus without complication (HCC)   . Hypertension     No past surgical history on file.  Social History   Socioeconomic History  . Marital status: Single    Spouse name: Not on file  . Number of children: Not on file  . Years of education: Not on file  . Highest education level: Not on file  Occupational History  . Occupation: Unemployed  Social Needs  . Financial resource strain: Not on file   . Food insecurity:    Worry: Not on file    Inability: Not on file  . Transportation needs:    Medical: Not on file    Non-medical: Not on file  Tobacco Use  . Smoking status: Never Smoker  . Smokeless tobacco: Never Used  Substance and Sexual Activity  . Alcohol use: No    Alcohol/week: 0.0 oz  . Drug use: No  . Sexual activity: Not on file  Lifestyle  . Physical activity:    Days per week: Not on file    Minutes per session: Not on file  . Stress: Not on file  Relationships  . Social connections:    Talks on phone: Not on file    Gets together: Not on file    Attends religious service: Not on file    Active member of club or organization: Not on file    Attends meetings of clubs or organizations: Not on file    Relationship status: Not on file  . Intimate partner violence:    Fear of current or ex partner: Not on file    Emotionally abused: Not on file    Physically abused: Not on file    Forced sexual activity: Not on file  Other Topics Concern  . Not on file  Social History Narrative   Live with parents in [place].     Family History  Problem Relation Age  of Onset  . Diabetes Mother   . Hyperlipidemia Father   . Hypertension Father      Review of Systems  Constitutional: Negative.  Negative for chills and fever.  HENT: Negative.  Negative for sore throat.   Eyes: Negative.  Negative for discharge and redness.  Respiratory: Negative.  Negative for cough and shortness of breath.   Cardiovascular: Negative.  Negative for chest pain and palpitations.  Gastrointestinal: Negative.  Negative for abdominal pain, diarrhea, nausea and vomiting.  Genitourinary: Negative.  Negative for dysuria and hematuria.  Skin: Positive for rash (groin).  Neurological: Negative for dizziness and headaches.  Endo/Heme/Allergies: Negative.   All other systems reviewed and are negative.  Vitals:   11/13/17 1007  BP: 140/88  Pulse: 93  Resp: 16  Temp: 98.6 F (37 C)    SpO2: 96%     Physical Exam  Constitutional: He is oriented to person, place, and time. He appears well-developed.  Obese  HENT:  Head: Normocephalic and atraumatic.  Eyes: Pupils are equal, round, and reactive to light. EOM are normal.  Neck: Normal range of motion. Neck supple.  Cardiovascular: Normal rate.  Pulmonary/Chest: Effort normal.  Musculoskeletal: Normal range of motion.  Neurological: He is alert and oriented to person, place, and time.  Skin: Skin is warm and dry. Capillary refill takes less than 2 seconds.  Left groin: Positive opened abscess draining purulent material with surrounding erythema and induration.  Adequate drainage.  Testicles within normal limits.  Nontender.  Psychiatric: He has a normal mood and affect. His behavior is normal.  Vitals reviewed.    ASSESSMENT & PLAN: [name] was seen today for cyst in groin area left side.  Diagnoses and all orders for this visit:  Abscess of groin, left -     WOUND CULTURE -     doxycycline (VIBRA-TABS) 100 MG tablet; Take 1 tablet (100 mg total) by mouth 2 (two) times daily.   Follow-up in 3 days.  Patient Instructions       IF you received an x-ray today, you will receive an invoice from [facility]. Please contact [facility] at [phone] with questions or concerns regarding your invoice.   IF you received labwork today, you will receive an invoice from [facility]. Please contact [facility] at [phone] with questions or concerns regarding your invoice.   Our billing staff will not be able to assist you with questions regarding bills from these companies.  You will be contacted with the lab results as soon as they are available. The fastest way to get your results is to activate your My Chart account. Instructions are located on the last page of this paperwork. If you have not heard from us regarding the results in 2 weeks, please contact this office.     Skin Abscess A skin  abscess is an infected area on or under your skin that contains pus and other material. An abscess can happen almost anywhere on your body. Some abscesses break open (rupture) on their own. Most continue to get worse unless they are treated. The infection can spread deeper into the body and into your blood, which can make you feel sick. Treatment usually involves draining the abscess. Follow these instructions at home: Abscess Care  If you have an abscess that has not drained, place a warm, clean, wet washcloth over the abscess several times a day. Do this as told by your doctor.  Follow instructions from your doctor about how to take care of  your abscess. Make sure you: ? Cover the abscess with a bandage (dressing). ? Change your bandage or gauze as told by your doctor. ? Wash your hands with soap and water before you change the bandage or gauze. If you cannot use soap and water, use hand sanitizer.  Check your abscess every day for signs that the infection is getting worse. Check for: ? More redness, swelling, or pain. ? More fluid or blood. ? Warmth. ? More pus or a bad smell. Medicines   Take over-the-counter and prescription medicines only as told by your doctor.  If you were prescribed an antibiotic medicine, take it as told by your doctor. Do not stop taking the antibiotic even if you start to feel better. General instructions  To avoid spreading the infection: ? Do not share personal care items, towels, or hot tubs with others. ? Avoid making skin-to-skin contact with other people.  Keep all follow-up visits as told by your doctor. This is important. Contact a doctor if:  You have more redness, swelling, or pain around your abscess.  You have more fluid or blood coming from your abscess.  Your abscess feels warm when you touch it.  You have more pus or a bad smell coming from your abscess.  You have a fever.  Your muscles ache.  You have chills.  You feel  sick. Get help right away if:  You have very bad (severe) pain.  You see red streaks on your skin spreading away from the abscess. This information is not intended to replace advice given to you by your health care provider. Make sure you discuss any questions you have with your health care provider. Document Released: 10/28/2007 Document Revised: 01/05/2016 Document Reviewed: 03/20/2015 Elsevier Interactive Patient Education  2018 Elsevier Inc.     [name], MD [facility]

## 2017-11-16 ENCOUNTER — Other Ambulatory Visit: Payer: Self-pay

## 2017-11-16 ENCOUNTER — Ambulatory Visit (INDEPENDENT_AMBULATORY_CARE_PROVIDER_SITE_OTHER): Payer: Self-pay | Admitting: Emergency Medicine

## 2017-11-16 ENCOUNTER — Encounter: Payer: Self-pay | Admitting: Emergency Medicine

## 2017-11-16 VITALS — BP 130/80 | HR 83 | Temp 98.5°F | Resp 16 | Ht 74.5 in | Wt 382.4 lb

## 2017-11-16 DIAGNOSIS — L02214 Cutaneous abscess of groin: Secondary | ICD-10-CM

## 2017-11-16 LAB — WOUND CULTURE

## 2017-11-16 NOTE — Patient Instructions (Addendum)
     IF you received an x-ray today, you will receive an invoice from [facility]. Please contact [facility] at [phone] with questions or concerns regarding your invoice.   IF you received labwork today, you will receive an invoice from [facility]. Please contact [facility] at [phone] with questions or concerns regarding your invoice.   Our billing staff will not be able to assist you with questions regarding bills from these companies.  You will be contacted with the lab results as soon as they are available. The fastest way to get your results is to activate your My Chart account. Instructions are located on the last page of this paperwork. If you have not heard from us regarding the results in 2 weeks, please contact this office.     Skin Abscess A skin abscess is an infected area on or under your skin that contains pus and other material. An abscess can happen almost anywhere on your body. Some abscesses break open (rupture) on their own. Most continue to get worse unless they are treated. The infection can spread deeper into the body and into your blood, which can make you feel sick. Treatment usually involves draining the abscess. Follow these instructions at home: Abscess Care  If you have an abscess that has not drained, place a warm, clean, wet washcloth over the abscess several times a day. Do this as told by your doctor.  Follow instructions from your doctor about how to take care of your abscess. Make sure you: ? Cover the abscess with a bandage (dressing). ? Change your bandage or gauze as told by your doctor. ? Wash your hands with soap and water before you change the bandage or gauze. If you cannot use soap and water, use hand sanitizer.  Check your abscess every day for signs that the infection is getting worse. Check for: ? More redness, swelling, or pain. ? More fluid or blood. ? Warmth. ? More pus or a bad smell. Medicines   Take  over-the-counter and prescription medicines only as told by your doctor.  If you were prescribed an antibiotic medicine, take it as told by your doctor. Do not stop taking the antibiotic even if you start to feel better. General instructions  To avoid spreading the infection: ? Do not share personal care items, towels, or hot tubs with others. ? Avoid making skin-to-skin contact with other people.  Keep all follow-up visits as told by your doctor. This is important. Contact a doctor if:  You have more redness, swelling, or pain around your abscess.  You have more fluid or blood coming from your abscess.  Your abscess feels warm when you touch it.  You have more pus or a bad smell coming from your abscess.  You have a fever.  Your muscles ache.  You have chills.  You feel sick. Get help right away if:  You have very bad (severe) pain.  You see red streaks on your skin spreading away from the abscess. This information is not intended to replace advice given to you by your health care provider. Make sure you discuss any questions you have with your health care provider. Document Released: 10/28/2007 Document Revised: 01/05/2016 Document Reviewed: 03/20/2015 Elsevier Interactive Patient Education  2018 Elsevier Inc.  

## 2017-11-16 NOTE — Progress Notes (Signed)
[name] 37 y.o.   Chief Complaint  Patient presents with  . abcess groin    follow up - 11/13/2017    HISTORY OF PRESENT ILLNESS: This is a 37 y.o. male here for follow-up of a groin abscess.  Seen by me on 11/13/2017.  Abscess draining spontaneously.  On doxycycline twice a day.  Doing much better.  Much improved.  No new symptoms.  No new concerns or complaints.  HPI   Prior to Admission medications   Medication Sig Start Date End Date Taking? Authorizing Provider  doxycycline (VIBRA-TABS) 100 MG tablet Take 1 tablet (100 mg total) by mouth 2 (two) times daily. 11/13/17  Yes Philmore Lepore, Eilleen Kempf, MD  glipiZIDE (GLUCOTROL) 5 MG tablet Take 1 tablet (5 mg total) by mouth daily before breakfast. 06/18/17  Yes English, Stephanie D, PA  metFORMIN (GLUCOPHAGE) 1000 MG tablet Take 1 tablet (1,000 mg total) by mouth 2 (two) times daily with a meal. 06/18/17  Yes English, Stephanie D, PA  Multiple Vitamins-Minerals (MULTIVITAMIN ADULT PO) Take 1 tablet by mouth daily.   Yes [provider]    Allergies  Allergen Reactions  . Milk-Related Compounds     Patient Active Problem List   Diagnosis Date Noted  . Abscess of groin, left 11/13/2017  . Cellulitis of right toe 08/27/2017  . Diabetes mellitus without complication (HCC) 07/30/2017  . Tinea pedis 07/30/2017  . Venous insufficiency of both lower extremities 07/30/2017  . Obesity, Class III, BMI 40-49.9 (morbid obesity) (HCC) 07/30/2017  . Essential hypertension 09/03/2016    Past Medical History:  Diagnosis Date  . Diabetes mellitus without complication (HCC)   . Hypertension     No past surgical history on file.  Social History   Socioeconomic History  . Marital status: Single    Spouse name: Not on file  . Number of children: Not on file  . Years of education: Not on file  . Highest education level: Not on file  Occupational History  . Occupation: Unemployed  Social Needs  . Financial resource  strain: Not on file  . Food insecurity:    Worry: Not on file    Inability: Not on file  . Transportation needs:    Medical: Not on file    Non-medical: Not on file  Tobacco Use  . Smoking status: Never Smoker  . Smokeless tobacco: Never Used  Substance and Sexual Activity  . Alcohol use: No    Alcohol/week: 0.0 oz  . Drug use: No  . Sexual activity: Not on file  Lifestyle  . Physical activity:    Days per week: Not on file    Minutes per session: Not on file  . Stress: Not on file  Relationships  . Social connections:    Talks on phone: Not on file    Gets together: Not on file    Attends religious service: Not on file    Active member of club or organization: Not on file    Attends meetings of clubs or organizations: Not on file    Relationship status: Not on file  . Intimate partner violence:    Fear of current or ex partner: Not on file    Emotionally abused: Not on file    Physically abused: Not on file    Forced sexual activity: Not on file  Other Topics Concern  . Not on file  Social History Narrative   Live with parents in [place].     Family  History  Problem Relation Age of Onset  . Diabetes Mother   . Hyperlipidemia Father   . Hypertension Father      Review of Systems  Constitutional: Negative.  Negative for chills and fever.  Respiratory: Negative for shortness of breath.   Gastrointestinal: Negative for abdominal pain, diarrhea, nausea and vomiting.   Vitals:   11/16/17 1028  BP: 130/80  Pulse: 83  Resp: 16  Temp: 98.5 F (36.9 C)  SpO2: 98%     Physical Exam  Constitutional: He is oriented to person, place, and time. He appears well-developed.  Obese  Eyes: Pupils are equal, round, and reactive to light.  Neck: Normal range of motion.  Cardiovascular: Normal rate.  Pulmonary/Chest: Effort normal.  Genitourinary:  Genitourinary Comments: Much improved area.  Musculoskeletal: Normal range of motion.  Neurological: He is alert  and oriented to person, place, and time.  Skin: Skin is warm and dry. Capillary refill takes less than 2 seconds.  Psychiatric: He has a normal mood and affect. His behavior is normal.  Vitals reviewed.    ASSESSMENT & PLAN: [name] was seen today for abcess groin.  Diagnoses and all orders for this visit:  Abscess of groin, left Comments: much improved    Patient Instructions       IF you received an x-ray today, you will receive an invoice from [facility]. Please contact [facility] at [phone] with questions or concerns regarding your invoice.   IF you received labwork today, you will receive an invoice from [facility]. Please contact [facility] at [phone] with questions or concerns regarding your invoice.   Our billing staff will not be able to assist you with questions regarding bills from these companies.  You will be contacted with the lab results as soon as they are available. The fastest way to get your results is to activate your My Chart account. Instructions are located on the last page of this paperwork. If you have not heard from us regarding the results in 2 weeks, please contact this office.     Skin Abscess A skin abscess is an infected area on or under your skin that contains pus and other material. An abscess can happen almost anywhere on your body. Some abscesses break open (rupture) on their own. Most continue to get worse unless they are treated. The infection can spread deeper into the body and into your blood, which can make you feel sick. Treatment usually involves draining the abscess. Follow these instructions at home: Abscess Care  If you have an abscess that has not drained, place a warm, clean, wet washcloth over the abscess several times a day. Do this as told by your doctor.  Follow instructions from your doctor about how to take care of your abscess. Make sure you: ? Cover the abscess with a bandage  (dressing). ? Change your bandage or gauze as told by your doctor. ? Wash your hands with soap and water before you change the bandage or gauze. If you cannot use soap and water, use hand sanitizer.  Check your abscess every day for signs that the infection is getting worse. Check for: ? More redness, swelling, or pain. ? More fluid or blood. ? Warmth. ? More pus or a bad smell. Medicines   Take over-the-counter and prescription medicines only as told by your doctor.  If you were prescribed an antibiotic medicine, take it as told by your doctor. Do not stop taking the antibiotic even if you start to  feel better. General instructions  To avoid spreading the infection: ? Do not share personal care items, towels, or hot tubs with others. ? Avoid making skin-to-skin contact with other people.  Keep all follow-up visits as told by your doctor. This is important. Contact a doctor if:  You have more redness, swelling, or pain around your abscess.  You have more fluid or blood coming from your abscess.  Your abscess feels warm when you touch it.  You have more pus or a bad smell coming from your abscess.  You have a fever.  Your muscles ache.  You have chills.  You feel sick. Get help right away if:  You have very bad (severe) pain.  You see red streaks on your skin spreading away from the abscess. This information is not intended to replace advice given to you by your health care provider. Make sure you discuss any questions you have with your health care provider. Document Released: 10/28/2007 Document Revised: 01/05/2016 Document Reviewed: 03/20/2015 Elsevier Interactive Patient Education  2018 Elsevier Inc.      [name], MD [facility]

## 2018-01-25 ENCOUNTER — Ambulatory Visit (INDEPENDENT_AMBULATORY_CARE_PROVIDER_SITE_OTHER): Payer: Self-pay | Admitting: Family Medicine

## 2018-01-25 ENCOUNTER — Encounter: Payer: Self-pay | Admitting: Family Medicine

## 2018-01-25 VITALS — BP 140/88 | HR 103 | Temp 98.8°F | Resp 16 | Ht 74.0 in | Wt 371.0 lb

## 2018-01-25 DIAGNOSIS — F322 Major depressive disorder, single episode, severe without psychotic features: Secondary | ICD-10-CM

## 2018-01-25 DIAGNOSIS — S025XXA Fracture of tooth (traumatic), initial encounter for closed fracture: Secondary | ICD-10-CM

## 2018-01-25 DIAGNOSIS — E1165 Type 2 diabetes mellitus with hyperglycemia: Secondary | ICD-10-CM

## 2018-01-25 LAB — POCT GLYCOSYLATED HEMOGLOBIN (HGB A1C): HEMOGLOBIN A1C: 11.3 % — AB (ref 4.0–5.6)

## 2018-01-25 MED ORDER — AMOXICILLIN 500 MG PO CAPS: 500.0000 mg | ORAL_CAPSULE | Freq: Two times a day (BID) | ORAL | 0 refills | Status: AC

## 2018-01-25 NOTE — Progress Notes (Signed)
Chief Complaint  Patient presents with  . Depression    per triage  . Neck Pain  . Jaw Pain    HPI Dental Pain Pt reports a week ago he had a cracked tooth Early last week a different tooth on the right side low jaw started to hurt He states that his jaw is sensitive and painful He states that he has tightness in his jaw and it is hard to get his mouth open to eat He denies fevers or chills He reports that he has several cavities He states that a part of the tooth cracked off without pain but he had sensitivity He reports that he has not seen a dentist in years around 10 years or more ago He had a bad experience with dentist He has some pain with swallowing He is not a smoker He is a diabetic Lab Results  Component Value Date   HGBA1C 12.8 06/18/2017    Severe depression  Depression screen [name] 2/9 01/25/2018 11/16/2017 11/13/2017 08/27/2017 06/23/2017  Decreased Interest 3 2 3 2  0  Down, Depressed, Hopeless 2 2 2 2  0  PHQ - 2 Score 5 4 5 4  0  Altered sleeping 3 3 3 2  -  Tired, decreased energy 3 3 2 2  -  Change in appetite 3 1 1  0 -  Feeling bad or failure about yourself  3 2 3 2  -  Trouble concentrating 0 3 3 2  -  Moving slowly or fidgety/restless 2 2 3  0 -  Suicidal thoughts 1 0 0 0 -  PHQ-9 Score 20 18 20 12  -  Difficult doing work/chores Extremely dIfficult - Extremely dIfficult - -   He reports that he has not been happy and feels like he does not deserve to have happy days He has never talked to anyone about his moods He states that with the depression he has stretches where he goes through the motions during the day and things like taking meds doesn't really crop up He tries to find an activity to get ride out the bad days He gets bad days half of the days He has not been formally diagnosed but he believes he has bipolar disorder, anxiety and ADD as well as bad depression He can hear voices if he has not been sleeping He can go a day and a half without sleeping The  longest he has gone without sleeping is 4 days    Past Medical History:  Diagnosis Date  . Diabetes mellitus without complication (HCC)   . Hypertension     Current Outpatient Medications  Medication Sig Dispense Refill  . doxycycline (VIBRA-TABS) 100 MG tablet Take 1 tablet (100 mg total) by mouth 2 (two) times daily. 20 tablet 0  . glipiZIDE (GLUCOTROL) 5 MG tablet Take 1 tablet (5 mg total) by mouth daily before breakfast. 90 tablet 1  . metFORMIN (GLUCOPHAGE) 1000 MG tablet Take 1 tablet (1,000 mg total) by mouth 2 (two) times daily with a meal. 180 tablet 1  . Multiple Vitamins-Minerals (MULTIVITAMIN ADULT PO) Take 1 tablet by mouth daily.     No current facility-administered medications for this visit.     Allergies:  Allergies  Allergen Reactions  . Milk-Related Compounds     History reviewed. No pertinent surgical history.  Social History   Socioeconomic History  . Marital status: Single    Spouse name: Not on file  . Number of children: Not on file  . Years of education: Not on file  .  Highest education level: Not on file  Occupational History  . Occupation: Unemployed  Social Needs  . Financial resource strain: Not on file  . Food insecurity:    Worry: Not on file    Inability: Not on file  . Transportation needs:    Medical: Not on file    Non-medical: Not on file  Tobacco Use  . Smoking status: Never Smoker  . Smokeless tobacco: Never Used  Substance and Sexual Activity  . Alcohol use: No    Alcohol/week: 0.0 standard drinks  . Drug use: No  . Sexual activity: Not on file  Lifestyle  . Physical activity:    Days per week: Not on file    Minutes per session: Not on file  . Stress: Not on file  Relationships  . Social connections:    Talks on phone: Not on file    Gets together: Not on file    Attends religious service: Not on file    Active member of club or organization: Not on file    Attends meetings of clubs or organizations: Not on file     Relationship status: Not on file  Other Topics Concern  . Not on file  Social History Narrative   Live with parents in [place].     Family History  Problem Relation Age of Onset  . Diabetes Mother   . Hyperlipidemia Father   . Hypertension Father      ROS Review of Systems See HPI Constitution: No fevers or chills No malaise No diaphoresis Skin: No rash or itching Eyes: no blurry vision, no double vision GU: no dysuria or hematuria Neuro: no dizziness or headaches all others reviewed and negative   Objective: Vitals:   01/25/18 1010  BP: 140/88  Pulse: (!) 103  Resp: 16  Temp: 98.8 F (37.1 C)  TempSrc: Oral  SpO2: 98%  Weight: (!) 371 lb (168.3 kg)  Height: 6\' 2"  (1.88 m)   Wt Readings from Last 3 Encounters:  01/25/18 (!) 371 lb (168.3 kg)  11/16/17 (!) 382 lb 6.4 oz (173.5 kg)  11/13/17 (!) 378 lb 3.2 oz (171.6 kg)     Physical Exam General: alert, oriented, in NAD Head: normocephalic, atraumatic, no sinus tenderness Eyes: EOM intact, no scleral icterus or conjunctival injection Ears: TM clear bilaterally Nose: mucosa nonerythematous, nonedematous Mouth: last molar on the lower jaw showing cracked tooth The gum around the lower molars shows mild erythem, no fluctuance Throat: no pharyngeal exudate or erythema Lymph: no posterior auricular, submental or cervical lymph adenopathy Heart: normal rate, normal sinus rhythm, no murmurs Lungs: clear to auscultation bilaterally, no wheezing   Assessment and Plan [name] was seen today for depression, neck pain and jaw pain.  Diagnoses and all orders for this visit:  Severe gum disease -   Advised to go to [employer] or to call a local dentist for pay scale  Severe depression (HCC) -     Patient advised to follow up with [facility] No currents SI/HI or hallucinations  Uncontrolled type 2 diabetes mellitus with hyperglycemia (HCC) -     POCT glycosylated hemoglobin (Hb A1C) -   Poor compliance due to depression  -  Will refer for [facility] -  Advised to set a daily alarm for his medications a1c improved slightly Follow up with PCP  [name] [name]

## 2018-01-25 NOTE — Patient Instructions (Addendum)
Your A1c is 11.3% 01/25/2018 It was 12.8% 06/18/2017  [facility] Address: [address] [place], [place], [place] [address]  Phone: [phone]  A nonprofit organization supporting thousands in [place] with intellectual and developmental disabilities, mental illness, and substance use disorders.  -------------------------------------------------------  [employer] for reduced cost dentistry or try a local dentist near you    If you have lab work done today you will be contacted with your lab results within the next 2 weeks.  If you have not heard from [name] then please contact us. The fastest way to get your results is to register for My Chart.   IF you received an x-ray today, you will receive an invoice from [facility]. Please contact [facility] at [phone] with questions or concerns regarding your invoice.   IF you received labwork today, you will receive an invoice from [facility]. Please contact [facility] at [phone] with questions or concerns regarding your invoice.   Our billing staff will not be able to assist you with questions regarding bills from these companies.  You will be contacted with the lab results as soon as they are available. The fastest way to get your results is to activate your My Chart account. Instructions are located on the last page of this paperwork. If you have not heard from [name] regarding the results in 2 weeks, please contact this office.    Dental Extraction A dental extraction is the removal (extraction) of a tooth. You may need to have a dental extraction if:  You have tooth decay or gum disease.  You have an infection (abscess).  Room needs to be made for other teeth to grow in or to be aligned properly.  Baby (primary) teeth are preventing adult (permanent) teeth from coming to the surface (erupting).  You have a tooth fracture or fractures that are not repairable.  You are going to be  having radiation to your head and neck.  The type and length of procedure that you have depends on the reason for the extraction and the placement of the tooth or teeth that are being removed. The procedure may be:  A simple extraction. This is done if the tooth is visible in the mouth and is above the gumline.  A surgical extraction. This is done if the tooth has not come into the mouth or if the tooth is broken off below the gumline.  Tell a health care provider about:  Any allergies you have.  All medicines you are taking, including vitamins, herbs, eye drops, creams, and over-the-counter medicines.  Any problems you or family members have had with anesthetic medicines.  Any blood disorders you have.  Any surgeries you have had.  Any medical conditions you have. What are the risks? Generally, this is a safe procedure. However, problems may occur, including:  Damage to surrounding teeth, nerves, tissues, or structures.  The blood clot does not form or stay in place where the tooth was removed. This causes the bones and nerves underneath to be exposed (dry socket). This can delay healing.  Incomplete extraction of roots.  Jawbone injury, pain, or weakness.  What happens before the procedure?  Ask your health care provider about: ? Changing or stopping your regular medicines. This is especially important if you are taking diabetes medicines or blood thinners. ? Taking medicines such as aspirin and ibuprofen. These medicines can thin your blood. Do not take these medicines before your procedure if your health care  provider instructs you not to.  Take medicines, such as antibiotic medicines, as directed by your health care provider.  Follow instructions from your health care provider about eating or drinking restrictions.  Plan to have someone take you home after the procedure.  If you go home right after the procedure, plan to have someone with you for 24 hours. What  happens during the procedure?  You may be given one or more of the following: ? A medicine that helps you relax (sedative). ? A medicine that numbs the area (local anesthetic). ? A medicine that makes you fall asleep (general anesthetic).  If you are having a simple extraction: ? Your dentist will loosen the tooth with an instrument called an elevator. ? Another instrument called forceps will be used to grasp the tooth and remove it from the socket. ? The open socket will be cleaned. ? Gauze will be placed in the socket to reduce bleeding.  If you are having a surgical extraction: ? Your dentist will make an incision in the gum. ? Some of the bone around the tooth may need to be removed. ? The tooth will be removed. ? Stitches (sutures) may be required to close the area. The procedure may vary among health care providers and hospitals. What happens after the procedure?  You may have gauze in your mouth where the tooth was removed. If directed by your health care provider, apply gentle pressure on the gauze for up to one hour after the procedure. This will help to control bleeding.  A blood clot should begin to form over the open socket. This is normal. Do not touch the area, and do not rinse it.  You may be given medicines to help control pain and help your recovery. This information is not intended to replace advice given to you by your health care provider. Make sure you discuss any questions you have with your health care provider. Document Released: 05/11/2005 Document Revised: 10/17/2015 Document Reviewed: 05/07/2014 Elsevier Interactive Patient Education  [employer].

## 2018-02-23 ENCOUNTER — Ambulatory Visit: Payer: Self-pay | Admitting: Emergency Medicine

## 2018-05-02 ENCOUNTER — Encounter: Payer: Self-pay | Admitting: Family Medicine

## 2018-05-02 ENCOUNTER — Other Ambulatory Visit: Payer: Self-pay

## 2018-05-02 ENCOUNTER — Ambulatory Visit: Payer: Self-pay | Admitting: Family Medicine

## 2018-05-02 VITALS — BP 142/82 | HR 83 | Temp 98.2°F | Resp 17 | Ht 74.0 in | Wt 371.0 lb

## 2018-05-02 DIAGNOSIS — E1165 Type 2 diabetes mellitus with hyperglycemia: Secondary | ICD-10-CM | POA: Insufficient documentation

## 2018-05-02 DIAGNOSIS — L03031 Cellulitis of right toe: Secondary | ICD-10-CM

## 2018-05-02 LAB — POCT CBC
Granulocyte percent: 61.2 %G (ref 37–80)
HCT, POC: 42.2 % — AB (ref 29–41)
Hemoglobin: 14.2 g/dL — AB (ref 9.5–13.5)
Lymph, poc: 2.2 (ref 0.6–3.4)
MCH, POC: 27.8 pg (ref 27–31.2)
MCHC: 33.6 g/dL (ref 31.8–35.4)
MCV: 82.6 fL (ref 76–111)
MID (CBC): 0.4 (ref 0–0.9)
MPV: 7 fL (ref 0–99.8)
PLATELET COUNT, POC: 237 10*3/uL (ref 142–424)
POC Granulocyte: 4.2 (ref 2–6.9)
POC LYMPH %: 32.5 % (ref 10–50)
POC MID %: 6.3 %M (ref 0–12)
RBC: 5.12 M/uL (ref 4.69–6.13)
RDW, POC: 13.5 %
WBC: 6.8 10*3/uL (ref 4.6–10.2)

## 2018-05-02 LAB — POCT GLYCOSYLATED HEMOGLOBIN (HGB A1C): Hemoglobin A1C: 11.4 % — AB (ref 4.0–5.6)

## 2018-05-02 MED ORDER — AMOXICILLIN-POT CLAVULANATE 875-125 MG PO TABS: 1.0000 | ORAL_TABLET | Freq: Two times a day (BID) | ORAL | 0 refills | Status: DC

## 2018-05-02 NOTE — Assessment & Plan Note (Signed)
Will treat with augmentin to cover for diabetic foot

## 2018-05-02 NOTE — Patient Instructions (Addendum)
   If you have lab work done today you will be contacted with your lab results within the next 2 weeks.  If you have not heard from us then please contact us. The fastest way to get your results is to register for My Chart.   IF you received an x-ray today, you will receive an invoice from [facility]. Please contact [facility] at [phone] with questions or concerns regarding your invoice.   IF you received labwork today, you will receive an invoice from [facility]. Please contact [facility] at [phone] with questions or concerns regarding your invoice.   Our billing staff will not be able to assist you with questions regarding bills from these companies.  You will be contacted with the lab results as soon as they are available. The fastest way to get your results is to activate your My Chart account. Instructions are located on the last page of this paperwork. If you have not heard from us regarding the results in 2 weeks, please contact this office.     Cellulitis, Adult Cellulitis is a skin infection. The infected area is usually red and tender. This condition occurs most often in the arms and lower legs. The infection can travel to the muscles, blood, and underlying tissue and become serious. It is very important to get treated for this condition. What are the causes? Cellulitis is caused by bacteria. The bacteria enter through a break in the skin, such as a cut, burn, insect bite, open sore, or crack. What increases the risk? This condition is more likely to occur in people who:  Have a weak defense system (immune system).  Have open wounds on the skin such as cuts, burns, bites, and scrapes. Bacteria can enter the body through these open wounds.  Are older.  Have diabetes.  Have a type of long-lasting (chronic) liver disease (cirrhosis) or kidney disease.  Use IV drugs.  What are the signs or symptoms? Symptoms of this condition include:  Redness,  streaking, or spotting on the skin.  Swollen area of the skin.  Tenderness or pain when an area of the skin is touched.  Warm skin.  Fever.  Chills.  Blisters.  How is this diagnosed? This condition is diagnosed based on a medical history and physical exam. You may also have tests, including:  Blood tests.  Lab tests.  Imaging tests.  How is this treated? Treatment for this condition may include:  Medicines, such as antibiotic medicines or antihistamines.  Supportive care, such as rest and application of cold or warm cloths (cold or warm compresses) to the skin.  Hospital care, if the condition is severe.  The infection usually gets better within 1-2 days of treatment. Follow these instructions at home:  Take over-the-counter and prescription medicines only as told by your health care provider.  If you were prescribed an antibiotic medicine, take it as told by your health care provider. Do not stop taking the antibiotic even if you start to feel better.  Drink enough fluid to keep your urine clear or pale yellow.  Do not touch or rub the infected area.  Raise (elevate) the infected area above the level of your heart while you are sitting or lying down.  Apply warm or cold compresses to the affected area as told by your health care provider.  Keep all follow-up visits as told by your health care provider. This is important. These visits let your health care provider make sure a more serious   infection is not developing. Contact a health care provider if:  You have a fever.  Your symptoms do not improve within 1-2 days of starting treatment.  Your bone or joint underneath the infected area becomes painful after the skin has healed.  Your infection returns in the same area or another area.  You notice a swollen bump in the infected area.  You develop new symptoms.  You have a general ill feeling (malaise) with muscle aches and pains. Get help right away  if:  Your symptoms get worse.  You feel very sleepy.  You develop vomiting or diarrhea that persists.  You notice red streaks coming from the infected area.  Your red area gets larger or turns dark in color. This information is not intended to replace advice given to you by your health care provider. Make sure you discuss any questions you have with your health care provider. Document Released: 02/18/2005 Document Revised: 09/19/2015 Document Reviewed: 03/20/2015 Elsevier Interactive Patient Education  2018 [employer]  

## 2018-05-02 NOTE — Progress Notes (Signed)
Established Patient Office Visit  Subjective:  Patient ID: [name], male    DOB: [birth year]  Age: 37 y.o. MRN: [id number]  CC:  Chief Complaint  Patient presents with  . blisters on both big toes    x 04/20/18.  Per pt better but 2 places on toes giving pt a fit.  Pt works in [employer] and didn't realize until day 2 he didn't have to wear steel toe boots    HPI [name] presents for   Pt reports that he has some wounds on his feet They don't really hurt but it seems to start with a blister which he was covering with a bandaid and using tripple antibiotic cream States that he started working a new job before thankgiving and was wearing steel toe boots on concrete and has blisters States that he was told he could change to tennis shoes He states that by then he had blisters He uses athlete's foot otc creams typically but felt like this was beyond that He states that his right great toe started turning red so he came in     Recurrent Depression  Depression screen [facility] 2/9 05/02/2018 01/25/2018 11/16/2017 11/13/2017 08/27/2017  Decreased Interest 1 3 2 3 2   Down, Depressed, Hopeless 3 2 2 2 2   PHQ - 2 Score 4 5 4 5 4   Altered sleeping 1 3 3 3 2   Tired, decreased energy 3 3 3 2 2   Change in appetite 1 3 1 1  0  Feeling bad or failure about yourself  - 3 2 3 2   Trouble concentrating 3 0 3 3 2   Moving slowly or fidgety/restless 2 2 2 3  0  Suicidal thoughts 0 1 0 0 0  PHQ-9 Score 14 20 18 20 12   Difficult doing work/chores Somewhat difficult Extremely dIfficult - Extremely dIfficult -   Pt reports that he has not had thoughts of suicide. He would like to discuss his depression at another visit.       Lab Results  Component Value Date   HGBA1C 11.4 (A) 05/02/2018    Past Medical History:  Diagnosis Date  . Diabetes mellitus without complication (HCC)   . Hypertension     No past surgical history on file.  Family History  Problem Relation Age of Onset   . Diabetes Mother   . Hyperlipidemia Father   . Hypertension Father     Social History   Socioeconomic History  . Marital status: Single    Spouse name: Not on file  . Number of children: Not on file  . Years of education: Not on file  . Highest education level: Not on file  Occupational History  . Occupation: Unemployed  Social Needs  . Financial resource strain: Not on file  . Food insecurity:    Worry: Not on file    Inability: Not on file  . Transportation needs:    Medical: Not on file    Non-medical: Not on file  Tobacco Use  . Smoking status: Never Smoker  . Smokeless tobacco: Never Used  Substance and Sexual Activity  . Alcohol use: No    Alcohol/week: 0.0 standard drinks  . Drug use: No  . Sexual activity: Not on file  Lifestyle  . Physical activity:    Days per week: Not on file    Minutes per session: Not on file  . Stress: Not on file  Relationships  . Social connections:    Talks on phone:  Not on file    Gets together: Not on file    Attends religious service: Not on file    Active member of club or organization: Not on file    Attends meetings of clubs or organizations: Not on file    Relationship status: Not on file  . Intimate partner violence:    Fear of current or ex partner: Not on file    Emotionally abused: Not on file    Physically abused: Not on file    Forced sexual activity: Not on file  Other Topics Concern  . Not on file  Social History Narrative   Live with parents in [place].     Outpatient Medications Prior to Visit  Medication Sig Dispense Refill  . glipiZIDE (GLUCOTROL) 5 MG tablet Take 1 tablet (5 mg total) by mouth daily before breakfast. 90 tablet 1  . metFORMIN (GLUCOPHAGE) 1000 MG tablet Take 1 tablet (1,000 mg total) by mouth 2 (two) times daily with a meal. 180 tablet 1  . Multiple Vitamins-Minerals (MULTIVITAMIN ADULT PO) Take 1 tablet by mouth daily.     No facility-administered medications prior to visit.      Allergies  Allergen Reactions  . Milk-Related Compounds     ROS Review of Systems    Objective:    Physical Exam  BP (!) 142/82 (BP Location: Left Arm, Patient Position: Sitting, Cuff Size: Large)   Pulse 83   Temp 98.2 F (36.8 C) (Oral)   Resp 17   Ht 6\' 2"  (1.88 m)   Wt (!) 371 lb (168.3 kg)   SpO2 99%   BMI 47.63 kg/m  Wt Readings from Last 3 Encounters:  05/02/18 (!) 371 lb (168.3 kg)  01/25/18 (!) 371 lb (168.3 kg)  11/16/17 (!) 382 lb 6.4 oz (173.5 kg)   Physical Exam  Constitutional: Oriented to person, place, and time. Appears well-developed and well-nourished.  HENT:  Head: Normocephalic and atraumatic.  Eyes: Conjunctivae and EOM are normal.  Cardiovascular: Normal rate, regular rhythm, normal heart sounds and intact distal pulses.  No murmur heard. Pulmonary/Chest: Effort normal and breath sounds normal. No stridor. No respiratory distress. Has no wheezes.  Neurological: Is alert and oriented to person, place, and time.  Psychiatric: Has a normal mood and affect. Behavior is normal. Judgment and thought content normal.   Callous on both feet  Right great toe with erythema without exudate at the site of the blister No fluctuance or induration Both feet with scaly skin with thickened yellowing gray toenails   Health Maintenance Due  Topic Date Due  . PNEUMOCOCCAL POLYSACCHARIDE VACCINE AGE 43-64 HIGH RISK  04/25/1981  . OPHTHALMOLOGY EXAM  04/25/1989  . URINE MICROALBUMIN  04/25/1989  . HIV Screening  04/25/1994  . FOOT EXAM  09/03/2017    There are no preventive care reminders to display for this patient.  No results found for: TSH Lab Results  Component Value Date   WBC 6.8 05/02/2018   HGB 14.2 (A) 05/02/2018   HCT 42.2 (A) 05/02/2018   MCV 82.6 05/02/2018   PLT 279 07/30/2017   Lab Results  Component Value Date   NA 137 07/30/2017   K 4.0 07/30/2017   CO2 24 07/30/2017   GLUCOSE 209 (H) 07/30/2017   BUN 9 07/30/2017    CREATININE 0.72 07/30/2017   BILITOT 0.6 07/30/2017   ALKPHOS 80 07/30/2017   AST 32 07/30/2017   ALT 29 07/30/2017   PROT 8.2 (H) 07/30/2017   ALBUMIN  4.0 07/30/2017   CALCIUM 9.4 07/30/2017   ANIONGAP 10 07/30/2017   No results found for: CHOL No results found for: HDL No results found for: LDLCALC No results found for: TRIG No results found for: CHOLHDL Lab Results  Component Value Date   HGBA1C 11.4 (A) 05/02/2018      Assessment & Plan:   Problem List Items Addressed This Visit      Endocrine   Uncontrolled type 2 diabetes mellitus with hyperglycemia (HCC)    Pt uncontrolled. Follow up for diabetes in one month. Discussed that diabetes foot care will be important  He is poorly control on just metformin and glipizide.  Next visit pt may have insurance if things go well at the job. If not we can try lilycares or other charity program      Relevant Orders   POCT CBC (Completed)   POCT glycosylated hemoglobin (Hb A1C) (Completed)     Other   Cellulitis of right toe - Primary    Will treat with augmentin to cover for diabetic foot       Relevant Orders   POCT CBC (Completed)   POCT glycosylated hemoglobin (Hb A1C) (Completed)      Meds ordered this encounter  Medications  . amoxicillin-clavulanate (AUGMENTIN) 875-125 MG tablet    Sig: Take 1 tablet by mouth 2 (two) times daily.    Dispense:  20 tablet    Refill:  0    Follow-up: Return in about 4 weeks (around 05/30/2018) for diabetes follow up.    Doristine BosworthZoe A Lonni Dirden, MD

## 2018-05-02 NOTE — Assessment & Plan Note (Signed)
Pt uncontrolled. Follow up for diabetes in one month. Discussed that diabetes foot care will be important  He is poorly control on just metformin and glipizide.  Next visit pt may have insurance if things go well at the job. If not we can try lilycares or other charity program

## 2018-05-31 ENCOUNTER — Ambulatory Visit: Payer: Self-pay | Admitting: Emergency Medicine

## 2018-10-10 ENCOUNTER — Other Ambulatory Visit: Payer: Self-pay | Admitting: Emergency Medicine

## 2018-10-10 NOTE — Telephone Encounter (Unsigned)
Copied from CRM [id number]. Topic: Quick Communication - Rx Refill/Question >> Oct 10, 2018  9:15 AM Elliot Gault wrote: Medication: glipiZIDE (GLUCOTROL) 5 MG tablet, metFORMIN (GLUCOPHAGE) 1000 MG tablet    Preferred Pharmacy (with phone number or street name):   [facility] [place], [place] - [address] [phone] (Phone) [phone] (Fax)   Agent: Please be advised that RX refills may take up to 3 business days. We ask that you follow-up with your pharmacy.

## 2018-10-14 NOTE — Telephone Encounter (Signed)
Pt needs to be seen in the office to get refills

## 2018-10-19 ENCOUNTER — Ambulatory Visit: Payer: Self-pay | Admitting: Emergency Medicine

## 2018-10-19 ENCOUNTER — Encounter: Payer: Self-pay | Admitting: Emergency Medicine

## 2018-10-19 ENCOUNTER — Other Ambulatory Visit: Payer: Self-pay

## 2018-10-19 VITALS — BP 143/88 | HR 98 | Temp 98.8°F | Resp 16 | Ht 73.5 in | Wt 360.2 lb

## 2018-10-19 DIAGNOSIS — E119 Type 2 diabetes mellitus without complications: Secondary | ICD-10-CM

## 2018-10-19 DIAGNOSIS — E1165 Type 2 diabetes mellitus with hyperglycemia: Secondary | ICD-10-CM

## 2018-10-19 DIAGNOSIS — I10 Essential (primary) hypertension: Secondary | ICD-10-CM

## 2018-10-19 DIAGNOSIS — E1159 Type 2 diabetes mellitus with other circulatory complications: Secondary | ICD-10-CM

## 2018-10-19 DIAGNOSIS — IMO0001 Reserved for inherently not codable concepts without codable children: Secondary | ICD-10-CM

## 2018-10-19 MED ORDER — LISINOPRIL 20 MG PO TABS: 20.0000 mg | ORAL_TABLET | Freq: Every day | ORAL | 3 refills | Status: DC

## 2018-10-19 MED ORDER — GLIPIZIDE 5 MG PO TABS: 5.0000 mg | ORAL_TABLET | Freq: Two times a day (BID) | ORAL | 1 refills | Status: DC

## 2018-10-19 MED ORDER — ATORVASTATIN CALCIUM 20 MG PO TABS: 20.0000 mg | ORAL_TABLET | Freq: Every day | ORAL | 3 refills | Status: DC

## 2018-10-19 MED ORDER — METFORMIN HCL 1000 MG PO TABS: 1000.0000 mg | ORAL_TABLET | Freq: Two times a day (BID) | ORAL | 1 refills | Status: DC

## 2018-10-19 NOTE — Patient Instructions (Addendum)
   If you have lab work done today you will be contacted with your lab results within the next 2 weeks.  If you have not heard from us then please contact us. The fastest way to get your results is to register for My Chart.   IF you received an x-ray today, you will receive an invoice from [facility]. Please contact [facility] at [phone] with questions or concerns regarding your invoice.   IF you received labwork today, you will receive an invoice from [facility]. Please contact [facility] at [phone] with questions or concerns regarding your invoice.   Our billing staff will not be able to assist you with questions regarding bills from these companies.  You will be contacted with the lab results as soon as they are available. The fastest way to get your results is to activate your My Chart account. Instructions are located on the last page of this paperwork. If you have not heard from us regarding the results in 2 weeks, please contact this office.     Hypertension Hypertension, commonly called high blood pressure, is when the force of blood pumping through the arteries is too strong. The arteries are the blood vessels that carry blood from the heart throughout the body. Hypertension forces the heart to work harder to pump blood and may cause arteries to become narrow or stiff. Having untreated or uncontrolled hypertension can cause heart attacks, strokes, kidney disease, and other problems. A blood pressure reading consists of a higher number over a lower number. Ideally, your blood pressure should be below 120/80. The first ("top") number is called the systolic pressure. It is a measure of the pressure in your arteries as your heart beats. The second ("bottom") number is called the diastolic pressure. It is a measure of the pressure in your arteries as the heart relaxes. What are the causes? The cause of this condition is not known. What increases the risk? Some  risk factors for high blood pressure are under your control. Others are not. Factors you can change  Smoking.  Having type 2 diabetes mellitus, high cholesterol, or both.  Not getting enough exercise or physical activity.  Being overweight.  Having too much fat, sugar, calories, or salt (sodium) in your diet.  Drinking too much alcohol. Factors that are difficult or impossible to change  Having chronic kidney disease.  Having a family history of high blood pressure.  Age. Risk increases with age.  Race. You may be at higher risk if you are African-American.  Gender. Men are at higher risk than women before age 45. After age 65, women are at higher risk than men.  Having obstructive sleep apnea.  Stress. What are the signs or symptoms? Extremely high blood pressure (hypertensive crisis) may cause:  Headache.  Anxiety.  Shortness of breath.  Nosebleed.  Nausea and vomiting.  Severe chest pain.  Jerky movements you cannot control (seizures). How is this diagnosed? This condition is diagnosed by measuring your blood pressure while you are seated, with your arm resting on a surface. The cuff of the blood pressure monitor will be placed directly against the skin of your upper arm at the level of your heart. It should be measured at least twice using the same arm. Certain conditions can cause a difference in blood pressure between your right and left arms. Certain factors can cause blood pressure readings to be lower or higher than normal (elevated) for a short period of time:  When your   blood pressure is higher when you are in a health care provider's office than when you are at home, this is called white coat hypertension. Most people with this condition do not need medicines.  When your blood pressure is higher at home than when you are in a health care provider's office, this is called masked hypertension. Most people with this condition may need medicines to control  blood pressure. If you have a high blood pressure reading during one visit or you have normal blood pressure with other risk factors:  You may be asked to return on a different day to have your blood pressure checked again.  You may be asked to monitor your blood pressure at home for 1 week or longer. If you are diagnosed with hypertension, you may have other blood or imaging tests to help your health care provider understand your overall risk for other conditions. How is this treated? This condition is treated by making healthy lifestyle changes, such as eating healthy foods, exercising more, and reducing your alcohol intake. Your health care provider may prescribe medicine if lifestyle changes are not enough to get your blood pressure under control, and if:  Your systolic blood pressure is above 130.  Your diastolic blood pressure is above 80. Your personal target blood pressure may vary depending on your medical conditions, your age, and other factors. Follow these instructions at home: Eating and drinking   Eat a diet that is high in fiber and potassium, and low in sodium, added sugar, and fat. An example eating plan is called the DASH (Dietary Approaches to Stop Hypertension) diet. To eat this way: ? Eat plenty of fresh fruits and vegetables. Try to fill half of your plate at each meal with fruits and vegetables. ? Eat whole grains, such as whole wheat pasta, brown rice, or whole grain bread. Fill about one quarter of your plate with whole grains. ? Eat or drink low-fat dairy products, such as skim milk or low-fat yogurt. ? Avoid fatty cuts of meat, processed or cured meats, and poultry with skin. Fill about one quarter of your plate with lean proteins, such as fish, chicken without skin, beans, eggs, and tofu. ? Avoid premade and processed foods. These tend to be higher in sodium, added sugar, and fat.  Reduce your daily sodium intake. Most people with hypertension should eat less than  1,500 mg of sodium a day.  Limit alcohol intake to no more than 1 drink a day for nonpregnant women and 2 drinks a day for men. One drink equals 12 oz of beer, 5 oz of wine, or 1 oz of hard liquor. Lifestyle   Work with your health care provider to maintain a healthy body weight or to lose weight. Ask what an ideal weight is for you.  Get at least 30 minutes of exercise that causes your heart to beat faster (aerobic exercise) most days of the week. Activities may include walking, swimming, or biking.  Include exercise to strengthen your muscles (resistance exercise), such as pilates or lifting weights, as part of your weekly exercise routine. Try to do these types of exercises for 30 minutes at least 3 days a week.  Do not use any products that contain nicotine or tobacco, such as cigarettes and e-cigarettes. If you need help quitting, ask your health care provider.  Monitor your blood pressure at home as told by your health care provider.  Keep all follow-up visits as told by your health care provider. This is   important. Medicines  Take over-the-counter and prescription medicines only as told by your health care provider. Follow directions carefully. Blood pressure medicines must be taken as prescribed.  Do not skip doses of blood pressure medicine. Doing this puts you at risk for problems and can make the medicine less effective.  Ask your health care provider about side effects or reactions to medicines that you should watch for. Contact a health care provider if:  You think you are having a reaction to a medicine you are taking.  You have headaches that keep coming back (recurring).  You feel dizzy.  You have swelling in your ankles.  You have trouble with your vision. Get help right away if:  You develop a severe headache or confusion.  You have unusual weakness or numbness.  You feel faint.  You have severe pain in your chest or abdomen.  You vomit  repeatedly.  You have trouble breathing. Summary  Hypertension is when the force of blood pumping through your arteries is too strong. If this condition is not controlled, it may put you at risk for serious complications.  Your personal target blood pressure may vary depending on your medical conditions, your age, and other factors. For most people, a normal blood pressure is less than 120/80.  Hypertension is treated with lifestyle changes, medicines, or a combination of both. Lifestyle changes include weight loss, eating a healthy, low-sodium diet, exercising more, and limiting alcohol. This information is not intended to replace advice given to you by your health care provider. Make sure you discuss any questions you have with your health care provider. Document Released: 05/11/2005 Document Revised: 04/08/2016 Document Reviewed: 04/08/2016 Elsevier Interactive Patient Education  2019 [employer]  Diabetes Mellitus and Nutrition, Adult When you have diabetes (diabetes mellitus), it is very important to have healthy eating habits because your blood sugar (glucose) levels are greatly affected by what you eat and drink. Eating healthy foods in the appropriate amounts, at about the same times every day, can help you:  Control your blood glucose.  Lower your risk of heart disease.  Improve your blood pressure.  Reach or maintain a healthy weight. Every person with diabetes is different, and each person has different needs for a meal plan. Your health care provider may recommend that you work with a diet and nutrition specialist (dietitian) to make a meal plan that is best for you. Your meal plan may vary depending on factors such as:  The calories you need.  The medicines you take.  Your weight.  Your blood glucose, blood pressure, and cholesterol levels.  Your activity level.  Other health conditions you have, such as heart or kidney disease. How do carbohydrates affect  me? Carbohydrates, also called carbs, affect your blood glucose level more than any other type of food. Eating carbs naturally raises the amount of glucose in your blood. Carb counting is a method for keeping track of how many carbs you eat. Counting carbs is important to keep your blood glucose at a healthy level, especially if you use insulin or take certain oral diabetes medicines. It is important to know how many carbs you can safely have in each meal. This is different for every person. Your dietitian can help you calculate how many carbs you should have at each meal and for each snack. Foods that contain carbs include:  Bread, cereal, rice, pasta, and crackers.  Potatoes and corn.  Peas, beans, and lentils.  Milk and yogurt.  Fruit and   juice.  Desserts, such as cakes, cookies, ice cream, and candy. How does alcohol affect me? Alcohol can cause a sudden decrease in blood glucose (hypoglycemia), especially if you use insulin or take certain oral diabetes medicines. Hypoglycemia can be a life-threatening condition. Symptoms of hypoglycemia (sleepiness, dizziness, and confusion) are similar to symptoms of having too much alcohol. If your health care provider says that alcohol is safe for you, follow these guidelines:  Limit alcohol intake to no more than 1 drink per day for nonpregnant women and 2 drinks per day for men. One drink equals 12 oz of beer, 5 oz of wine, or 1 oz of hard liquor.  Do not drink on an empty stomach.  Keep yourself hydrated with water, diet soda, or unsweetened iced tea.  Keep in mind that regular soda, juice, and other mixers may contain a lot of sugar and must be counted as carbs. What are tips for following this plan?  Reading food labels  Start by checking the serving size on the "Nutrition Facts" label of packaged foods and drinks. The amount of calories, carbs, fats, and other nutrients listed on the label is based on one serving of the item. Many items  contain more than one serving per package.  Check the total grams (g) of carbs in one serving. You can calculate the number of servings of carbs in one serving by dividing the total carbs by 15. For example, if a food has 30 g of total carbs, it would be equal to 2 servings of carbs.  Check the number of grams (g) of saturated and trans fats in one serving. Choose foods that have low or no amount of these fats.  Check the number of milligrams (mg) of salt (sodium) in one serving. Most people should limit total sodium intake to less than 2,300 mg per day.  Always check the nutrition information of foods labeled as "low-fat" or "nonfat". These foods may be higher in added sugar or refined carbs and should be avoided.  Talk to your dietitian to identify your daily goals for nutrients listed on the label. Shopping  Avoid buying canned, premade, or processed foods. These foods tend to be high in fat, sodium, and added sugar.  Shop around the outside edge of the grocery store. This includes fresh fruits and vegetables, bulk grains, fresh meats, and fresh dairy. Cooking  Use low-heat cooking methods, such as baking, instead of high-heat cooking methods like deep frying.  Cook using healthy oils, such as olive, canola, or sunflower oil.  Avoid cooking with butter, cream, or high-fat meats. Meal planning  Eat meals and snacks regularly, preferably at the same times every day. Avoid going long periods of time without eating.  Eat foods high in fiber, such as fresh fruits, vegetables, beans, and whole grains. Talk to your dietitian about how many servings of carbs you can eat at each meal.  Eat 4-6 ounces (oz) of lean protein each day, such as lean meat, chicken, fish, eggs, or tofu. One oz of lean protein is equal to: ? 1 oz of meat, chicken, or fish. ? 1 egg. ?  cup of tofu.  Eat some foods each day that contain healthy fats, such as avocado, nuts, seeds, and fish. Lifestyle  Check your  blood glucose regularly.  Exercise regularly as told by your health care provider. This may include: ? 150 minutes of moderate-intensity or vigorous-intensity exercise each week. This could be brisk walking, biking, or water aerobics. ? Stretching   and doing strength exercises, such as yoga or weightlifting, at least 2 times a week.  Take medicines as told by your health care provider.  Do not use any products that contain nicotine or tobacco, such as cigarettes and e-cigarettes. If you need help quitting, ask your health care provider.  Work with a counselor or diabetes educator to identify strategies to manage stress and any emotional and social challenges. Questions to ask a health care provider  Do I need to meet with a diabetes educator?  Do I need to meet with a dietitian?  What number can I call if I have questions?  When are the best times to check my blood glucose? Where to find more information:  American Diabetes Association: diabetes.org  Academy of Nutrition and Dietetics: www.eatright.org  National Institute of Diabetes and Digestive and Kidney Diseases (NIH): www.niddk.nih.gov Summary  A healthy meal plan will help you control your blood glucose and maintain a healthy lifestyle.  Working with a diet and nutrition specialist (dietitian) can help you make a meal plan that is best for you.  Keep in mind that carbohydrates (carbs) and alcohol have immediate effects on your blood glucose levels. It is important to count carbs and to use alcohol carefully. This information is not intended to replace advice given to you by your health care provider. Make sure you discuss any questions you have with your health care provider. Document Released: 02/05/2005 Document Revised: 12/09/2016 Document Reviewed: 06/15/2016 Elsevier Interactive Patient Education  2019 [employer]  

## 2018-10-19 NOTE — Progress Notes (Signed)
Lab Results  Component Value Date   HGBA1C 11.4 (A) 05/02/2018   BP Readings from Last 3 Encounters:  05/02/18 (!) 142/82  01/25/18 140/88  11/16/17 130/80   Wt Readings from Last 3 Encounters:  05/02/18 (!) 371 lb (168.3 kg)  01/25/18 (!) 371 lb (168.3 kg)  11/16/17 (!) 382 lb 6.4 oz (173.5 kg)   [name] 38 y.o.   Chief Complaint  Patient presents with   Medication Refill    Metformin and Glipizide    HISTORY OF PRESENT ILLNESS: This is a 38 y.o. male here for follow-up of diabetes and hypertension.  On no medications.  Has been off medication for at least 3 months.  Was taking glipizide and metformin.  Was also taking blood pressure medication but not anymore. Also states that he has a history of ADHD and chronic depression.  On no medication.  HPI   Prior to Admission medications   Medication Sig Start Date End Date Taking? Authorizing Provider  amoxicillin-clavulanate (AUGMENTIN) 875-125 MG tablet Take 1 tablet by mouth 2 (two) times daily. 05/02/18  Yes Doristine Bosworth, MD  glipiZIDE (GLUCOTROL) 5 MG tablet Take 1 tablet (5 mg total) by mouth 2 (two) times daily before a meal. 10/19/18 01/17/19 Yes Breigh Annett, Eilleen Kempf, MD  metFORMIN (GLUCOPHAGE) 1000 MG tablet Take 1 tablet (1,000 mg total) by mouth 2 (two) times daily with a meal. 10/19/18  Yes Mayur Duman, Eilleen Kempf, MD  Multiple Vitamins-Minerals (MULTIVITAMIN ADULT PO) Take 1 tablet by mouth daily.   Yes [provider]  atorvastatin (LIPITOR) 20 MG tablet Take 1 tablet (20 mg total) by mouth daily. 10/19/18   [name], MD  lisinopril (ZESTRIL) 20 MG tablet Take 1 tablet (20 mg total) by mouth daily. 10/19/18   [name], MD    Allergies  Allergen Reactions   Milk-Related Compounds     Patient Active Problem List   Diagnosis Date Noted   Uncontrolled type 2 diabetes mellitus with hyperglycemia (HCC) 05/02/2018   Diabetes mellitus without complication (HCC)  07/30/2017   Venous insufficiency of both lower extremities 07/30/2017   Obesity, Class III, BMI 40-49.9 (morbid obesity) (HCC) 07/30/2017   Essential hypertension 09/03/2016    Past Medical History:  Diagnosis Date   Diabetes mellitus without complication (HCC)    Hypertension     History reviewed. No pertinent surgical history.  Social History   Socioeconomic History   Marital status: Single    Spouse name: Not on file   Number of children: Not on file   Years of education: Not on file   Highest education level: Not on file  Occupational History   Occupation: Unemployed  Social Needs   Financial resource strain: Not on file   Food insecurity:    Worry: Not on file    Inability: Not on file   Transportation needs:    Medical: Not on file    Non-medical: Not on file  Tobacco Use   Smoking status: Never Smoker   Smokeless tobacco: Never Used  Substance and Sexual Activity   Alcohol use: No    Alcohol/week: 0.0 standard drinks   Drug use: No   Sexual activity: Not on file  Lifestyle   Physical activity:    Days per week: Not on file    Minutes per session: Not on file   Stress: Not on file  Relationships   Social connections:    Talks on phone: Not on file  Gets together: Not on file    Attends religious service: Not on file    Active member of club or organization: Not on file    Attends meetings of clubs or organizations: Not on file    Relationship status: Not on file   Intimate partner violence:    Fear of current or ex partner: Not on file    Emotionally abused: Not on file    Physically abused: Not on file    Forced sexual activity: Not on file  Other Topics Concern   Not on file  Social History Narrative   Live with parents in [place].     Family History  Problem Relation Age of Onset   Diabetes Mother    Hyperlipidemia Father    Hypertension Father      Review of Systems  Constitutional: Negative.  Negative  for chills and fever.  HENT: Negative.  Negative for congestion and sore throat.   Eyes: Negative.  Negative for blurred vision.  Respiratory: Negative.  Negative for shortness of breath.   Cardiovascular: Negative.  Negative for chest pain.  Gastrointestinal: Negative.  Negative for abdominal pain, diarrhea, nausea and vomiting.  Genitourinary: Negative.  Negative for dysuria.  Musculoskeletal: Negative.  Negative for myalgias.  Skin: Negative.  Negative for rash.  Neurological: Negative.  Negative for dizziness and headaches.  Endo/Heme/Allergies: Negative.   All other systems reviewed and are negative.     Vitals:   10/19/18 1329  BP: (!) 143/88  Pulse: 98  Resp: 16  Temp: 98.8 F (37.1 C)  SpO2: 97%    Physical Exam Vitals signs reviewed.  Constitutional:      Appearance: He is obese.  HENT:     Head: Normocephalic and atraumatic.  Eyes:     Extraocular Movements: Extraocular movements intact.     Conjunctiva/sclera: Conjunctivae normal.     Pupils: Pupils are equal, round, and reactive to light.  Neck:     Musculoskeletal: Normal range of motion.  Cardiovascular:     Rate and Rhythm: Normal rate and regular rhythm.  Pulmonary:     Effort: Pulmonary effort is normal.     Breath sounds: Normal breath sounds.  Musculoskeletal: Normal range of motion.  Skin:    General: Skin is warm and dry.     Capillary Refill: Capillary refill takes less than 2 seconds.  Neurological:     General: No focal deficit present.     Mental Status: He is alert and oriented to person, place, and time.  Psychiatric:        Mood and Affect: Mood normal.        Behavior: Behavior normal.      ASSESSMENT & PLAN: Uncontrolled type 2 diabetes mellitus with hyperglycemia (HCC) Off medications for at least 3 months.  Will restart glipizide and metformin, follow-up in 3 months, and then measure hemoglobin A1c.  Diet nutrition and exercise discussed with patient.  Also needs to be started  on a statin, Lipitor 40 mg a day.  Essential hypertension Blood pressure off target.  Will start lisinopril 20 mg a day.  Follow-up in 3 months.  [name] was seen today for medication refill.  Diagnoses and all orders for this visit:  Hypertension associated with type 2 diabetes mellitus (HCC)  Uncontrolled type 2 diabetes mellitus without complication, without long-term current use of insulin (HCC) -     atorvastatin (LIPITOR) 20 MG tablet; Take 1 tablet (20 mg total) by mouth daily. -  glipiZIDE (GLUCOTROL) 5 MG tablet; Take 1 tablet (5 mg total) by mouth 2 (two) times daily before a meal.  Essential hypertension -     lisinopril (ZESTRIL) 20 MG tablet; Take 1 tablet (20 mg total) by mouth daily.  Type 2 diabetes mellitus without complication, without long-term current use of insulin (HCC) -     metFORMIN (GLUCOPHAGE) 1000 MG tablet; Take 1 tablet (1,000 mg total) by mouth 2 (two) times daily with a meal.  Obesity, Class III, BMI 40-49.9 (morbid obesity) (HCC)  Uncontrolled type 2 diabetes mellitus with hyperglycemia (HCC)    Patient Instructions       If you have lab work done today you will be contacted with your lab results within the next 2 weeks.  If you have not heard from [name] then please contact us. The fastest way to get your results is to register for My Chart.   IF you received an x-ray today, you will receive an invoice from [facility]. Please contact [facility] at [phone] with questions or concerns regarding your invoice.   IF you received labwork today, you will receive an invoice from [facility]. Please contact [facility] at [phone] with questions or concerns regarding your invoice.   Our billing staff will not be able to assist you with questions regarding bills from these companies.  You will be contacted with the lab results as soon as they are available. The fastest way to get your results is to activate your My Chart account.  Instructions are located on the last page of this paperwork. If you have not heard from [name] regarding the results in 2 weeks, please contact this office.     Hypertension Hypertension, commonly called high blood pressure, is when the force of blood pumping through the arteries is too strong. The arteries are the blood vessels that carry blood from the heart throughout the body. Hypertension forces the heart to work harder to pump blood and may cause arteries to become narrow or stiff. Having untreated or uncontrolled hypertension can cause heart attacks, strokes, kidney disease, and other problems. A blood pressure reading consists of a higher number over a lower number. Ideally, your blood pressure should be below 120/80. The first ("top") number is called the systolic pressure. It is a measure of the pressure in your arteries as your heart beats. The second ("bottom") number is called the diastolic pressure. It is a measure of the pressure in your arteries as the heart relaxes. What are the causes? The cause of this condition is not known. What increases the risk? Some risk factors for high blood pressure are under your control. Others are not. Factors you can change  Smoking.  Having type 2 diabetes mellitus, high cholesterol, or both.  Not getting enough exercise or physical activity.  Being overweight.  Having too much fat, sugar, calories, or salt (sodium) in your diet.  Drinking too much alcohol. Factors that are difficult or impossible to change  Having chronic kidney disease.  Having a family history of high blood pressure.  Age. Risk increases with age.  Race. You may be at higher risk if you are African-American.  Gender. Men are at higher risk than women before age 83. After age 54, women are at higher risk than men.  Having obstructive sleep apnea.  Stress. What are the signs or symptoms? Extremely high blood pressure (hypertensive crisis) may  cause:  Headache.  Anxiety.  Shortness of breath.  Nosebleed.  Nausea and vomiting.  Severe chest pain.  Jerky movements you cannot control (seizures). How is this diagnosed? This condition is diagnosed by measuring your blood pressure while you are seated, with your arm resting on a surface. The cuff of the blood pressure monitor will be placed directly against the skin of your upper arm at the level of your heart. It should be measured at least twice using the same arm. Certain conditions can cause a difference in blood pressure between your right and left arms. Certain factors can cause blood pressure readings to be lower or higher than normal (elevated) for a short period of time:  When your blood pressure is higher when you are in a health care provider's office than when you are at home, this is called white coat hypertension. Most people with this condition do not need medicines.  When your blood pressure is higher at home than when you are in a health care provider's office, this is called masked hypertension. Most people with this condition may need medicines to control blood pressure. If you have a high blood pressure reading during one visit or you have normal blood pressure with other risk factors:  You may be asked to return on a different day to have your blood pressure checked again.  You may be asked to monitor your blood pressure at home for 1 week or longer. If you are diagnosed with hypertension, you may have other blood or imaging tests to help your health care provider understand your overall risk for other conditions. How is this treated? This condition is treated by making healthy lifestyle changes, such as eating healthy foods, exercising more, and reducing your alcohol intake. Your health care provider may prescribe medicine if lifestyle changes are not enough to get your blood pressure under control, and if:  Your systolic blood pressure is above 130.  Your  diastolic blood pressure is above 80. Your personal target blood pressure may vary depending on your medical conditions, your age, and other factors. Follow these instructions at home: Eating and drinking   Eat a diet that is high in fiber and potassium, and low in sodium, added sugar, and fat. An example eating plan is called the DASH (Dietary Approaches to Stop Hypertension) diet. To eat this way: ? Eat plenty of fresh fruits and vegetables. Try to fill half of your plate at each meal with fruits and vegetables. ? Eat whole grains, such as whole wheat pasta, brown rice, or whole grain bread. Fill about one quarter of your plate with whole grains. ? Eat or drink low-fat dairy products, such as skim milk or low-fat yogurt. ? Avoid fatty cuts of meat, processed or cured meats, and poultry with skin. Fill about one quarter of your plate with lean proteins, such as fish, chicken without skin, beans, eggs, and tofu. ? Avoid premade and processed foods. These tend to be higher in sodium, added sugar, and fat.  Reduce your daily sodium intake. Most people with hypertension should eat less than 1,500 mg of sodium a day.  Limit alcohol intake to no more than 1 drink a day for nonpregnant women and 2 drinks a day for men. One drink equals 12 oz of beer, 5 oz of wine, or 1 oz of hard liquor. Lifestyle   Work with your health care provider to maintain a healthy body weight or to lose weight. Ask what an ideal weight is for you.  Get at least 30 minutes of exercise that causes your heart to beat  faster (aerobic exercise) most days of the week. Activities may include walking, swimming, or biking.  Include exercise to strengthen your muscles (resistance exercise), such as pilates or lifting weights, as part of your weekly exercise routine. Try to do these types of exercises for 30 minutes at least 3 days a week.  Do not use any products that contain nicotine or tobacco, such as cigarettes and  e-cigarettes. If you need help quitting, ask your health care provider.  Monitor your blood pressure at home as told by your health care provider.  Keep all follow-up visits as told by your health care provider. This is important. Medicines  Take over-the-counter and prescription medicines only as told by your health care provider. Follow directions carefully. Blood pressure medicines must be taken as prescribed.  Do not skip doses of blood pressure medicine. Doing this puts you at risk for problems and can make the medicine less effective.  Ask your health care provider about side effects or reactions to medicines that you should watch for. Contact a health care provider if:  You think you are having a reaction to a medicine you are taking.  You have headaches that keep coming back (recurring).  You feel dizzy.  You have swelling in your ankles.  You have trouble with your vision. Get help right away if:  You develop a severe headache or confusion.  You have unusual weakness or numbness.  You feel faint.  You have severe pain in your chest or abdomen.  You vomit repeatedly.  You have trouble breathing. Summary  Hypertension is when the force of blood pumping through your arteries is too strong. If this condition is not controlled, it may put you at risk for serious complications.  Your personal target blood pressure may vary depending on your medical conditions, your age, and other factors. For most people, a normal blood pressure is less than 120/80.  Hypertension is treated with lifestyle changes, medicines, or a combination of both. Lifestyle changes include weight loss, eating a healthy, low-sodium diet, exercising more, and limiting alcohol. This information is not intended to replace advice given to you by your health care provider. Make sure you discuss any questions you have with your health care provider. Document Released: 05/11/2005 Document Revised: 04/08/2016  Document Reviewed: 04/08/2016 Elsevier Interactive Patient Education  2019 Elsevier Inc.  Diabetes Mellitus and Nutrition, Adult When you have diabetes (diabetes mellitus), it is very important to have healthy eating habits because your blood sugar (glucose) levels are greatly affected by what you eat and drink. Eating healthy foods in the appropriate amounts, at about the same times every day, can help you:  Control your blood glucose.  Lower your risk of heart disease.  Improve your blood pressure.  Reach or maintain a healthy weight. Every person with diabetes is different, and each person has different needs for a meal plan. Your health care provider may recommend that you work with a diet and nutrition specialist (dietitian) to make a meal plan that is best for you. Your meal plan may vary depending on factors such as:  The calories you need.  The medicines you take.  Your weight.  Your blood glucose, blood pressure, and cholesterol levels.  Your activity level.  Other health conditions you have, such as heart or kidney disease. How do carbohydrates affect me? Carbohydrates, also called carbs, affect your blood glucose level more than any other type of food. Eating carbs naturally raises the amount of glucose in your  blood. Carb counting is a method for keeping track of how many carbs you eat. Counting carbs is important to keep your blood glucose at a healthy level, especially if you use insulin or take certain oral diabetes medicines. It is important to know how many carbs you can safely have in each meal. This is different for every person. Your dietitian can help you calculate how many carbs you should have at each meal and for each snack. Foods that contain carbs include:  Bread, cereal, rice, pasta, and crackers.  Potatoes and corn.  Peas, beans, and lentils.  Milk and yogurt.  Fruit and juice.  Desserts, such as cakes, cookies, ice cream, and candy. How does  alcohol affect me? Alcohol can cause a sudden decrease in blood glucose (hypoglycemia), especially if you use insulin or take certain oral diabetes medicines. Hypoglycemia can be a life-threatening condition. Symptoms of hypoglycemia (sleepiness, dizziness, and confusion) are similar to symptoms of having too much alcohol. If your health care provider says that alcohol is safe for you, follow these guidelines:  Limit alcohol intake to no more than 1 drink per day for nonpregnant women and 2 drinks per day for men. One drink equals 12 oz of beer, 5 oz of wine, or 1 oz of hard liquor.  Do not drink on an empty stomach.  Keep yourself hydrated with water, diet soda, or unsweetened iced tea.  Keep in mind that regular soda, juice, and other mixers may contain a lot of sugar and must be counted as carbs. What are tips for following this plan?  Reading food labels  Start by checking the serving size on the "Nutrition Facts" label of packaged foods and drinks. The amount of calories, carbs, fats, and other nutrients listed on the label is based on one serving of the item. Many items contain more than one serving per package.  Check the total grams (g) of carbs in one serving. You can calculate the number of servings of carbs in one serving by dividing the total carbs by 15. For example, if a food has 30 g of total carbs, it would be equal to 2 servings of carbs.  Check the number of grams (g) of saturated and trans fats in one serving. Choose foods that have low or no amount of these fats.  Check the number of milligrams (mg) of salt (sodium) in one serving. Most people should limit total sodium intake to less than 2,300 mg per day.  Always check the nutrition information of foods labeled as "low-fat" or "nonfat". These foods may be higher in added sugar or refined carbs and should be avoided.  Talk to your dietitian to identify your daily goals for nutrients listed on the  label. Shopping  Avoid buying canned, premade, or processed foods. These foods tend to be high in fat, sodium, and added sugar.  Shop around the outside edge of the grocery store. This includes fresh fruits and vegetables, bulk grains, fresh meats, and fresh dairy. Cooking  Use low-heat cooking methods, such as baking, instead of high-heat cooking methods like deep frying.  Cook using healthy oils, such as olive, canola, or sunflower oil.  Avoid cooking with butter, cream, or high-fat meats. Meal planning  Eat meals and snacks regularly, preferably at the same times every day. Avoid going long periods of time without eating.  Eat foods high in fiber, such as fresh fruits, vegetables, beans, and whole grains. Talk to your dietitian about how many servings of carbs  you can eat at each meal.  Eat 4-6 ounces (oz) of lean protein each day, such as lean meat, chicken, fish, eggs, or tofu. One oz of lean protein is equal to: ? 1 oz of meat, chicken, or fish. ? 1 egg. ?  cup of tofu.  Eat some foods each day that contain healthy fats, such as avocado, nuts, seeds, and fish. Lifestyle  Check your blood glucose regularly.  Exercise regularly as told by your health care provider. This may include: ? 150 minutes of moderate-intensity or vigorous-intensity exercise each week. This could be brisk walking, biking, or water aerobics. ? Stretching and doing strength exercises, such as yoga or weightlifting, at least 2 times a week.  Take medicines as told by your health care provider.  Do not use any products that contain nicotine or tobacco, such as cigarettes and e-cigarettes. If you need help quitting, ask your health care provider.  Work with a Veterinary surgeon or diabetes educator to identify strategies to manage stress and any emotional and social challenges. Questions to ask a health care provider  Do I need to meet with a diabetes educator?  Do I need to meet with a dietitian?  What  number can I call if I have questions?  When are the best times to check my blood glucose? Where to find more information:  American Diabetes Association: diabetes.org  Academy of Nutrition and Dietetics: www.eatright.[employer] of Diabetes and Digestive and Kidney Diseases (NIH): CarFlippers.tn Summary  A healthy meal plan will help you control your blood glucose and maintain a healthy lifestyle.  Working with a diet and nutrition specialist (dietitian) can help you make a meal plan that is best for you.  Keep in mind that carbohydrates (carbs) and alcohol have immediate effects on your blood glucose levels. It is important to count carbs and to use alcohol carefully. This information is not intended to replace advice given to you by your health care provider. Make sure you discuss any questions you have with your health care provider. Document Released: 02/05/2005 Document Revised: 12/09/2016 Document Reviewed: 06/15/2016 Elsevier Interactive Patient Education  2019 Elsevier Inc.      [name], MD [facility]

## 2018-10-19 NOTE — Assessment & Plan Note (Addendum)
Off medications for at least 3 months.  Will restart glipizide and metformin, follow-up in 3 months, and then measure hemoglobin A1c.  Diet nutrition and exercise discussed with patient.  Also needs to be started on a statin, Lipitor 40 mg a day.

## 2018-10-19 NOTE — Assessment & Plan Note (Signed)
Blood pressure off target.  Will start lisinopril 20 mg a day.  Follow-up in 3 months.

## 2019-01-18 ENCOUNTER — Ambulatory Visit: Payer: Self-pay | Admitting: Family Medicine

## 2019-01-18 ENCOUNTER — Ambulatory Visit: Payer: Self-pay | Admitting: Emergency Medicine

## 2019-03-29 ENCOUNTER — Telehealth (INDEPENDENT_AMBULATORY_CARE_PROVIDER_SITE_OTHER): Payer: Self-pay | Admitting: Emergency Medicine

## 2019-03-29 ENCOUNTER — Encounter: Payer: Self-pay | Admitting: Emergency Medicine

## 2019-03-29 ENCOUNTER — Other Ambulatory Visit: Payer: Self-pay

## 2019-03-29 VITALS — Ht 73.0 in | Wt 375.0 lb

## 2019-03-29 DIAGNOSIS — L03116 Cellulitis of left lower limb: Secondary | ICD-10-CM

## 2019-03-29 DIAGNOSIS — R509 Fever, unspecified: Secondary | ICD-10-CM

## 2019-03-29 MED ORDER — CEFADROXIL 500 MG PO CAPS: 500.0000 mg | ORAL_CAPSULE | Freq: Two times a day (BID) | ORAL | 0 refills | Status: AC

## 2019-03-29 NOTE — Patient Instructions (Signed)
° ° ° °  If you have lab work done today you will be contacted with your lab results within the next 2 weeks.  If you have not heard from us then please contact us. The fastest way to get your results is to register for My Chart. ° ° °IF you received an x-ray today, you will receive an invoice from [facility]. Please contact [facility] at [phone] with questions or concerns regarding your invoice.  ° °IF you received labwork today, you will receive an invoice from [facility]. Please contact [facility] at [phone] with questions or concerns regarding your invoice.  ° °Our billing staff will not be able to assist you with questions regarding bills from these companies. ° °You will be contacted with the lab results as soon as they are available. The fastest way to get your results is to activate your My Chart account. Instructions are located on the last page of this paperwork. If you have not heard from us regarding the results in 2 weeks, please contact this office. °  ° ° ° °

## 2019-03-29 NOTE — Progress Notes (Signed)
[id number]  Shakes  Dizziness Fever   Skin infection on leg- started day before yesterday   No covid test

## 2019-03-29 NOTE — Progress Notes (Signed)
Telemedicine Encounter- SOAP NOTE Established Patient  This telephone encounter was conducted with the patient's (or proxy's) verbal consent via audio telecommunications: yes/no: Yes Patient was instructed to have this encounter in a suitably private space; and to only have persons present to whom they give permission to participate. In addition, patient identity was confirmed by use of name plus two identifiers (DOB and address).  I discussed the limitations, risks, security and privacy concerns of performing an evaluation and management service by telephone and the availability of in person appointments. I also discussed with the patient that there may be a patient responsible charge related to this service. The patient expressed understanding and agreed to proceed.  I spent a total of TIME; 0 MIN TO 60 MIN: 10 minutes talking with the patient or their proxy.  No chief complaint on file. Possible leg infection  Subjective   [name] is a 38 y.o. male established patient. Telephone visit today for possible left lower leg infection.  Patient has a history of diabetes and hypertension, complaining of fever and chills with redness of left lower leg calf area.  Has history of the same in the past.  Had some diarrhea yesterday but none today.  No other significant symptoms.  Denies flulike symptoms or direct Covid exposure.  HPI   Patient Active Problem List   Diagnosis Date Noted   Uncontrolled type 2 diabetes mellitus with hyperglycemia (HCC) 05/02/2018   Diabetes mellitus without complication (HCC) 07/30/2017   Venous insufficiency of both lower extremities 07/30/2017   Obesity, Class III, BMI 40-49.9 (morbid obesity) (HCC) 07/30/2017   Essential hypertension 09/03/2016    Past Medical History:  Diagnosis Date   Diabetes mellitus without complication (HCC)    Hypertension     Current Outpatient Medications  Medication Sig Dispense Refill   atorvastatin  (LIPITOR) 20 MG tablet Take 1 tablet (20 mg total) by mouth daily. 90 tablet 3   glipiZIDE (GLUCOTROL) 5 MG tablet Take 1 tablet (5 mg total) by mouth 2 (two) times daily before a meal. 180 tablet 1   lisinopril (ZESTRIL) 20 MG tablet Take 1 tablet (20 mg total) by mouth daily. 90 tablet 3   metFORMIN (GLUCOPHAGE) 1000 MG tablet Take 1 tablet (1,000 mg total) by mouth 2 (two) times daily with a meal. 180 tablet 1   Multiple Vitamins-Minerals (MULTIVITAMIN ADULT PO) Take 1 tablet by mouth daily.     No current facility-administered medications for this visit.     Allergies  Allergen Reactions   Milk-Related Compounds     Social History   Socioeconomic History   Marital status: Single    Spouse name: Not on file   Number of children: Not on file   Years of education: Not on file   Highest education level: Not on file  Occupational History   Occupation: Unemployed  Social Needs   Financial resource strain: Not on file   Food insecurity    Worry: Not on file    Inability: Not on file   Transportation needs    Medical: Not on file    Non-medical: Not on file  Tobacco Use   Smoking status: Never Smoker   Smokeless tobacco: Never Used  Substance and Sexual Activity   Alcohol use: No    Alcohol/week: 0.0 standard drinks   Drug use: No   Sexual activity: Not on file  Lifestyle   Physical activity    Days per week: Not on file  Minutes per session: Not on file   Stress: Not on file  Relationships   Social connections    Talks on phone: Not on file    Gets together: Not on file    Attends religious service: Not on file    Active member of club or organization: Not on file    Attends meetings of clubs or organizations: Not on file    Relationship status: Not on file   Intimate partner violence    Fear of current or ex partner: Not on file    Emotionally abused: Not on file    Physically abused: Not on file    Forced sexual activity: Not on file    Other Topics Concern   Not on file  Social History Narrative   Live with parents in [place].     Review of Systems  Constitutional: Positive for chills and fever.  HENT: Negative.  Negative for congestion and sore throat.   Respiratory: Negative.  Negative for cough and shortness of breath.   Cardiovascular: Negative.  Negative for chest pain and palpitations.  Gastrointestinal: Positive for diarrhea. Negative for abdominal pain, blood in stool, nausea and vomiting.  Genitourinary: Negative.  Negative for dysuria and hematuria.  Musculoskeletal: Negative.   Skin: Positive for rash.  Neurological: Negative.  Negative for dizziness and headaches.  All other systems reviewed and are negative.   Objective  Alert and oriented x3 in no apparent respiratory distress. Vitals as reported by the patient: Today's Vitals   03/29/19 1549  Weight: (!) 375 lb (170.1 kg)  Height: 6\' 1"  (1.854 m)    Diagnoses and all orders for this visit:  Cellulitis of leg, left -     cefadroxil (DURICEF) 500 MG capsule; Take 1 capsule (500 mg total) by mouth 2 (two) times daily for 7 days.  Fever and chills -     Novel Coronavirus, NAA ([facility])  Clinically stable.  No red flag signs or symptoms.  COVID-19 unlikely.  Has a history of prior cellulitis on the same leg.  Feels the same way.  Advised to start antibiotics tonight and follow-up in the office if no better after 48 to 72 hours.    I discussed the assessment and treatment plan with the patient. The patient was provided an opportunity to ask questions and all were answered. The patient agreed with the plan and demonstrated an understanding of the instructions.   The patient was advised to call back or seek an in-person evaluation if the symptoms worsen or if the condition fails to improve as anticipated.  I provided 10 minutes of non-face-to-face time during this encounter.  Horald Pollen, MD  Primary Care at [facility]

## 2019-04-04 ENCOUNTER — Encounter: Payer: Self-pay | Admitting: Emergency Medicine

## 2019-04-04 ENCOUNTER — Other Ambulatory Visit: Payer: Self-pay

## 2019-04-04 ENCOUNTER — Ambulatory Visit (INDEPENDENT_AMBULATORY_CARE_PROVIDER_SITE_OTHER): Payer: Self-pay | Admitting: Emergency Medicine

## 2019-04-04 ENCOUNTER — Emergency Department (HOSPITAL_COMMUNITY): Payer: Self-pay

## 2019-04-04 ENCOUNTER — Emergency Department (HOSPITAL_COMMUNITY)
Admission: EM | Admit: 2019-04-04 | Discharge: 2019-04-04 | Disposition: A | Discharge status: Home / Self Care | Payer: Self-pay | Attending: Emergency Medicine | Admitting: Emergency Medicine

## 2019-04-04 VITALS — BP 104/71 | HR 95 | Temp 98.1°F | Resp 16 | Ht 73.5 in | Wt 349.4 lb

## 2019-04-04 DIAGNOSIS — L97529 Non-pressure chronic ulcer of other part of left foot with unspecified severity: Secondary | ICD-10-CM

## 2019-04-04 DIAGNOSIS — I10 Essential (primary) hypertension: Secondary | ICD-10-CM | POA: Insufficient documentation

## 2019-04-04 DIAGNOSIS — Z7984 Long term (current) use of oral hypoglycemic drugs: Secondary | ICD-10-CM | POA: Insufficient documentation

## 2019-04-04 DIAGNOSIS — E11621 Type 2 diabetes mellitus with foot ulcer: Secondary | ICD-10-CM | POA: Insufficient documentation

## 2019-04-04 DIAGNOSIS — L03032 Cellulitis of left toe: Secondary | ICD-10-CM

## 2019-04-04 DIAGNOSIS — L089 Local infection of the skin and subcutaneous tissue, unspecified: Secondary | ICD-10-CM

## 2019-04-04 DIAGNOSIS — E1165 Type 2 diabetes mellitus with hyperglycemia: Secondary | ICD-10-CM

## 2019-04-04 LAB — CBC WITH DIFFERENTIAL/PLATELET
Abs Immature Granulocytes: 0.09 10*3/uL — ABNORMAL HIGH (ref 0.00–0.07)
Basophils Absolute: 0.1 10*3/uL (ref 0.0–0.1)
Basophils Relative: 1 %
Eosinophils Absolute: 0.1 10*3/uL (ref 0.0–0.5)
Eosinophils Relative: 1 %
HCT: 43.8 % (ref 39.0–52.0)
Hemoglobin: 14.3 g/dL (ref 13.0–17.0)
Immature Granulocytes: 1 %
Lymphocytes Relative: 31 %
Lymphs Abs: 2.8 10*3/uL (ref 0.7–4.0)
MCH: 27.2 pg (ref 26.0–34.0)
MCHC: 32.6 g/dL (ref 30.0–36.0)
MCV: 83.3 fL (ref 80.0–100.0)
Monocytes Absolute: 0.7 10*3/uL (ref 0.1–1.0)
Monocytes Relative: 7 %
Neutro Abs: 5.4 10*3/uL (ref 1.7–7.7)
Neutrophils Relative %: 59 %
Platelets: 387 10*3/uL (ref 150–400)
RBC: 5.26 MIL/uL (ref 4.22–5.81)
RDW: 13.2 % (ref 11.5–15.5)
WBC: 9.2 10*3/uL (ref 4.0–10.5)
nRBC: 0 % (ref 0.0–0.2)

## 2019-04-04 LAB — COMPREHENSIVE METABOLIC PANEL
ALT: 26 U/L (ref 0–44)
AST: 24 U/L (ref 15–41)
Albumin: 3.6 g/dL (ref 3.5–5.0)
Alkaline Phosphatase: 91 U/L (ref 38–126)
Anion gap: 12 (ref 5–15)
BUN: 10 mg/dL (ref 6–20)
CO2: 23 mmol/L (ref 22–32)
Calcium: 9.5 mg/dL (ref 8.9–10.3)
Chloride: 101 mmol/L (ref 98–111)
Creatinine, Ser: 0.69 mg/dL (ref 0.61–1.24)
GFR calc Af Amer: >60 mL/min (ref >60)
GFR calc non Af Amer: >60 mL/min (ref >60)
Glucose, Bld: 205 mg/dL — ABNORMAL HIGH (ref 70–99)
Potassium: 3.9 mmol/L (ref 3.5–5.1)
Sodium: 136 mmol/L (ref 135–145)
Total Bilirubin: 0.6 mg/dL (ref 0.3–1.2)
Total Protein: 8.2 g/dL — ABNORMAL HIGH (ref 6.5–8.1)

## 2019-04-04 IMAGING — CR DG FOOT COMPLETE 3+V*L*
3 series · 3 of 3 positions shown · non-contrast
Comparison: Radiograph [DATE]

CLINICAL DATA: Left great toe swelling for a week and a half.
Question osteomyelitis.

EXAM:
LEFT FOOT - COMPLETE 3+ VIEW

[foot ap]
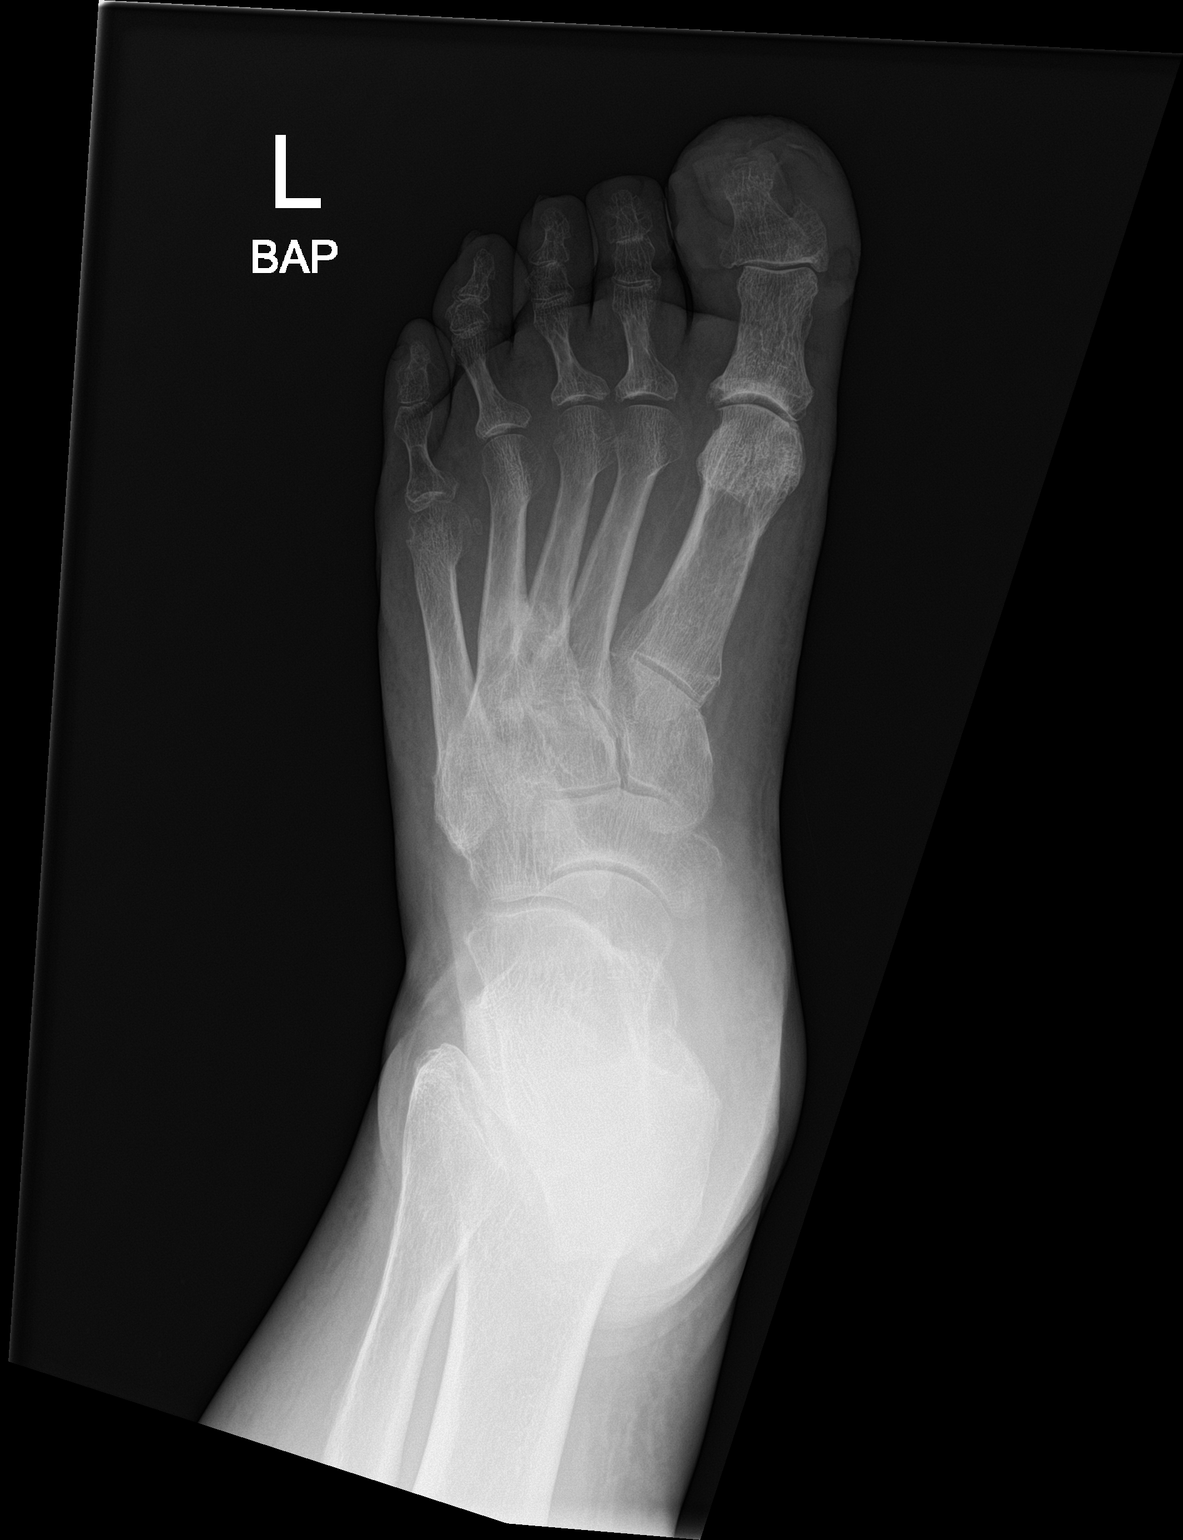

[foot obl]
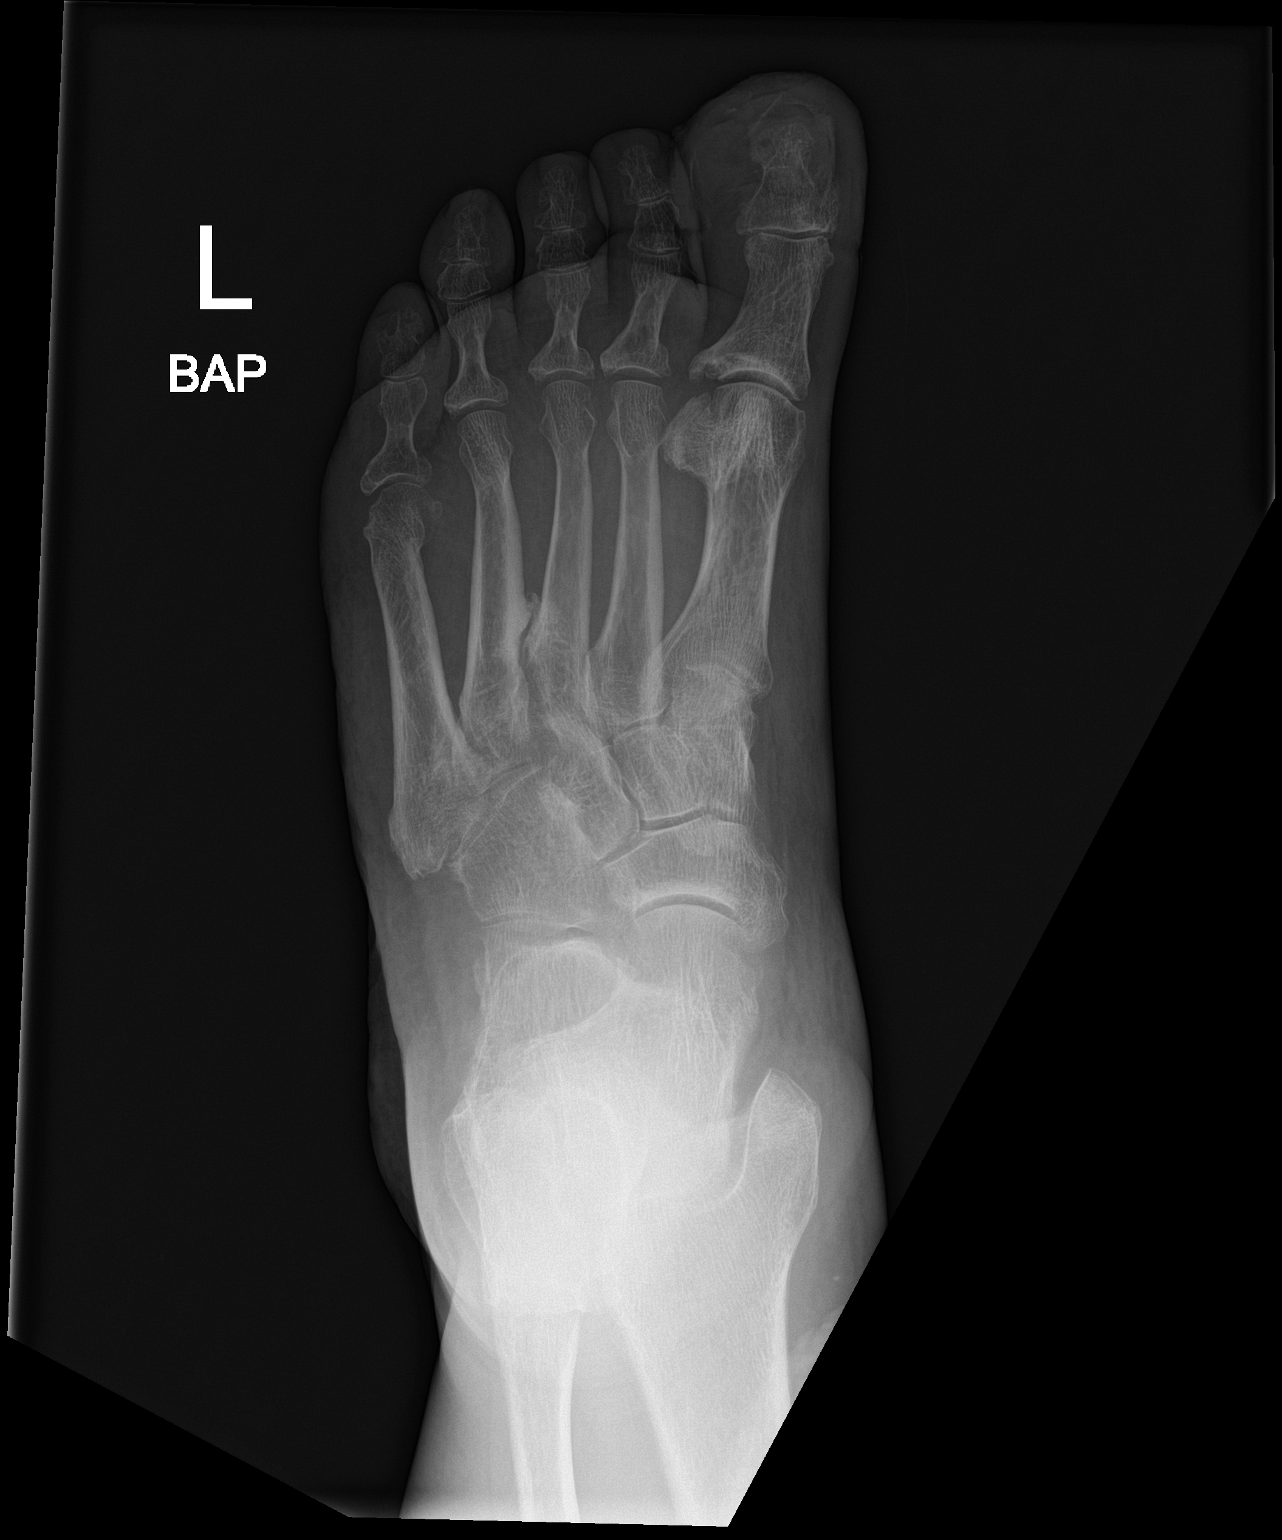

[foot lat]
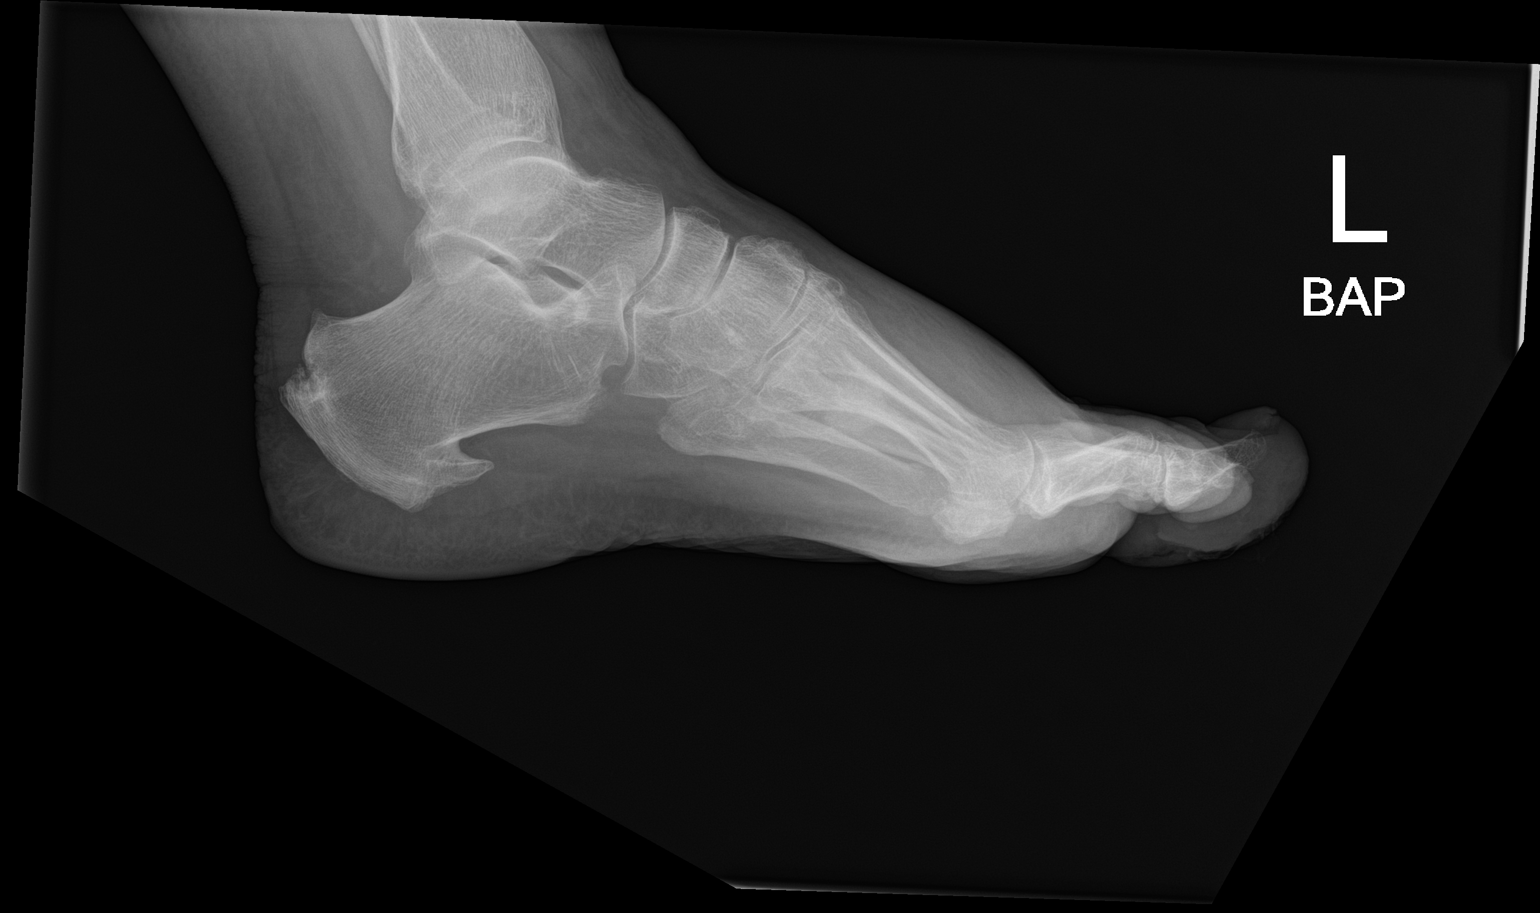

[3 of 3 positions shown; findings below may reference images not displayed]

FINDINGS: No bony destructive change or periosteal reaction. There is skin
irregularity about the great toe without tracking soft tissue air.
No radiopaque foreign body. No acute fracture. Bones are under
mineralized. Multifocal osteoarthritis, most prominent at the first
metatarsal phalangeal joint. Mild cortical thickening in the fourth
proximal phalanx may be sequela of remote prior injury. Prominent
plantar calcaneal spur with small Achilles tendon enthesophyte. Mild
generalized soft tissue edema.
IMPRESSION: 1. Soft tissue irregularity about the great toe without tracking
soft tissue air. No radiographic evidence of osteomyelitis.
2. Multifocal osteoarthritis, most prominent at the first
metatarsophalangeal joint.
3. Prominent plantar calcaneal spur.

## 2019-04-04 MED ORDER — DOXYCYCLINE HYCLATE 100 MG PO TABS
100.0000 mg | ORAL_TABLET | Freq: Once | ORAL | Status: DC
Start: 2019-04-04 — End: 2019-04-05

## 2019-04-04 MED ORDER — DOXYCYCLINE HYCLATE 100 MG PO TABS
100.0000 mg | ORAL_TABLET | Freq: Once | ORAL | Status: AC
  Administered 2019-04-04: 22:00:00 100 mg via ORAL
  Filled 2019-04-04: qty 1

## 2019-04-04 NOTE — Discharge Instructions (Addendum)
You are seen today for a ulcer to your foot.  This will need to be seen by orthopedics where they will discuss with you treatment options such as surgical debridement and/or amputation.  You were started on an additional antibiotic today.  Please take this antibiotic and also continue to take the [facility] that was prescribed by your primary care doctor.  Be sure to follow a diabetic diet to better control your sugars to prevent worsening problems.  Follow-up with your primary care doctor as well for diabetes management.  If you develop a fever or worsening symptoms please come to the emergency department. Thank you for allowing me to care for you today.

## 2019-04-04 NOTE — Addendum Note (Signed)
Addended by: Alfredia Ferguson A on: 04/04/2019 12:17 PM   Modules accepted: Orders

## 2019-04-04 NOTE — Patient Instructions (Addendum)
Go to emergency room now for further evaluation and treatment. Diabetic toe with infection and possible osteomyelitis.  Possibly need surgical treatment.   Cellulitis, Adult  Cellulitis is a skin infection. The infected area is often warm, red, swollen, and sore. It occurs most often in the arms and lower legs. It is very important to get treated for this condition. What are the causes? This condition is caused by bacteria. The bacteria enter through a break in the skin, such as a cut, burn, insect bite, open sore, or crack. What increases the risk? This condition is more likely to occur in people who:  Have a weak body defense system (immune system).  Have open cuts, burns, bites, or scrapes on the skin.  Are older than 40 years of age.  Have a blood sugar problem (diabetes).  Have a long-lasting (chronic) liver disease (cirrhosis) or kidney disease.  Are very overweight (obese).  Have a skin problem, such as: ? Itchy rash (eczema). ? Slow movement of blood in the veins (venous stasis). ? Fluid buildup below the skin (edema).  Have been treated with high-energy rays (radiation).  Use IV drugs. What are the signs or symptoms? Symptoms of this condition include:  Skin that is: ? Red. ? Streaking. ? Spotting. ? Swollen. ? Sore or painful when you touch it. ? Warm.  A fever.  Chills.  Blisters. How is this diagnosed? This condition is diagnosed based on:  Medical history.  Physical exam.  Blood tests.  Imaging tests. How is this treated? Treatment for this condition may include:  Medicines to treat infections or allergies.  Home care, such as: ? Rest. ? Placing cold or warm cloths (compresses) on the skin.  Hospital care, if the condition is very bad. Follow these instructions at home: Medicines  Take over-the-counter and prescription medicines only as told by your doctor.  If you were prescribed an antibiotic medicine, take it as told by your  doctor. Do not stop taking it even if you start to feel better. General instructions   Drink enough fluid to keep your pee (urine) pale yellow.  Do not touch or rub the infected area.  Raise (elevate) the infected area above the level of your heart while you are sitting or lying down.  Place cold or warm cloths on the area as told by your doctor.  Keep all follow-up visits as told by your doctor. This is important. Contact a doctor if:  You have a fever.  You do not start to get better after 1-2 days of treatment.  Your bone or joint under the infected area starts to hurt after the skin has healed.  Your infection comes back. This can happen in the same area or another area.  You have a swollen bump in the area.  You have new symptoms.  You feel ill and have muscle aches and pains. Get help right away if:  Your symptoms get worse.  You feel very sleepy.  You throw up (vomit) or have watery poop (diarrhea) for a long time.  You see red streaks coming from the area.  Your red area gets larger.  Your red area turns dark in color. These symptoms may represent a serious problem that is an emergency. Do not wait to see if the symptoms will go away. Get medical help right away. Call your local emergency services (911 in the [place] Do not drive yourself to the hospital. Summary  Cellulitis is a skin infection. The area is  often warm, red, swollen, and sore.  This condition is treated with medicines, rest, and cold and warm cloths.  Take all medicines only as told by your doctor.  Tell your doctor if symptoms do not start to get better after 1-2 days of treatment. This information is not intended to replace advice given to you by your health care provider. Make sure you discuss any questions you have with your health care provider. Document Released: 10/28/2007 Document Revised: 09/30/2017 Document Reviewed: 09/30/2017 Elsevier Patient Education  [employer].     If you have lab work done today you will be contacted with your lab results within the next 2 weeks.  If you have not heard from [name] then please contact us. The fastest way to get your results is to register for My Chart.   IF you received an x-ray today, you will receive an invoice from [facility]. Please contact [facility] at [phone] with questions or concerns regarding your invoice.   IF you received labwork today, you will receive an invoice from [facility]. Please contact [facility] at [phone] with questions or concerns regarding your invoice.   Our billing staff will not be able to assist you with questions regarding bills from these companies.  You will be contacted with the lab results as soon as they are available. The fastest way to get your results is to activate your My Chart account. Instructions are located on the last page of this paperwork. If you have not heard from [name] regarding the results in 2 weeks, please contact this office.

## 2019-04-04 NOTE — ED Provider Notes (Signed)
[facility] EMERGENCY DEPARTMENT Provider Note   CSN: [id number] Arrival date & time: 04/04/19  1225     History   Chief Complaint Chief Complaint  Patient presents with  . Cellulitis    HPI [name] is a 38 y.o. male.     Patient 38 year old patient with history of hypertension diabetes presenting to the emergency department for left big toe infection.  Patient reports that he has a progressively worsening toe infection over the last 2 weeks.  Was started on Duricef by primary care doctor on 4 November but reports the toe is getting worse. Has uncontrolled diabetes with sugar levels over 300 per his report.  Denies any fever, chills, nausea, vomiting.     Past Medical History:  Diagnosis Date  . Diabetes mellitus without complication (HCC)   . Hypertension     Patient Active Problem List   Diagnosis Date Noted  . Toe infection 04/04/2019  . Uncontrolled type 2 diabetes mellitus with hyperglycemia (HCC) 05/02/2018  . 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 Venous insufficiency of both lower extremities 07/30/2017  . Obesity, Class III, BMI 40-49.9 (morbid obesity) (HCC) 07/30/2017  . Essential hypertension 09/03/2016    No past surgical history on file.      Home Medications    Prior to Admission medications   Medication Sig Start Date End Date Taking? Authorizing Provider  atorvastatin (LIPITOR) 20 MG tablet Take 1 tablet (20 mg total) by mouth daily. Patient not taking: Reported on 04/04/2019 10/19/18   [name], MD  cefadroxil (DURICEF) 500 MG capsule Take 1 capsule (500 mg total) by mouth 2 (two) times daily for 7 days. 03/29/19 04/05/19  [name], MD  glipiZIDE (GLUCOTROL) 5 MG tablet Take 1 tablet (5 mg total) by mouth 2 (two) times daily before a meal. 10/19/18 03/29/19  Sagardia, Eilleen Kempf, MD  lisinopril (ZESTRIL) 20 MG tablet Take 1 tablet (20 mg total) by mouth daily. 10/19/18    [name], MD  metFORMIN (GLUCOPHAGE) 1000 MG tablet Take 1 tablet (1,000 mg total) by mouth 2 (two) times daily with a meal. 10/19/18   Sagardia, Eilleen Kempf, MD  Multiple Vitamins-Minerals (MULTIVITAMIN ADULT PO) Take 1 tablet by mouth daily.    [provider]    Family History Family History  Problem Relation Age of Onset  . Diabetes Mother   . Hyperlipidemia Father   . Hypertension Father     Social History Social History   Tobacco Use  . Smoking status: Never Smoker  . Smokeless tobacco: Never Used  Substance Use Topics  . Alcohol use: No    Alcohol/week: 0.0 standard drinks  . Drug use: No     Allergies   Milk-related compounds   Review of Systems Review of Systems  Constitutional: Negative for chills and fever.  Respiratory: Negative for cough and shortness of breath.   Musculoskeletal: Negative for back pain.  Skin: Positive for color change and wound.  Hematological: Does not bruise/bleed easily.     Physical Exam Updated Vital Signs BP 103/72   Pulse 87   Temp 98.4 F (36.9 C)   Resp 18   SpO2 98%   Physical Exam Vitals signs and nursing note reviewed.  Constitutional:      Appearance: Normal appearance.  HENT:     Head: Normocephalic.  Eyes:     Conjunctiva/sclera: Conjunctivae normal.  Cardiovascular:     Pulses:  Dorsalis pedis pulses are 1+ on the right side and 1+ on the left side.       Posterior tibial pulses are 1+ on the right side and 1+ on the left side.  Pulmonary:     Effort: Pulmonary effort is normal.  Musculoskeletal:       Feet:  Feet:     Left foot:     Skin integrity: Ulcer, skin breakdown, erythema, warmth, callus and dry skin present.     Comments: Decreased sensation in bilateral feet. Plantar aspect of the left big toe with wet ulcer and skin break down. Surrounding erythema extending to the dorsum of the big toe but not into the mid foot. Pulses are palpable.  Skin:    General: Skin  is dry.  Neurological:     Mental Status: He is alert.  Psychiatric:        Mood and Affect: Mood normal.      ED Treatments / Results  Labs (all labs ordered are listed, but only abnormal results are displayed) Labs Reviewed  CBC WITH DIFFERENTIAL/PLATELET - Abnormal; Notable for the following components:      Result Value   Abs Immature Granulocytes 0.09 (*)    All other components within normal limits  COMPREHENSIVE METABOLIC PANEL - Abnormal; Notable for the following components:   Glucose, Bld 205 (*)    Total Protein 8.2 (*)    All other components within normal limits    EKG None  Radiology Dg Foot Complete Left  Result Date: 04/04/2019 CLINICAL DATA:  Left great toe swelling for a week and a half. Question osteomyelitis. EXAM: LEFT FOOT - COMPLETE 3+ VIEW COMPARISON:  Radiograph 07/30/2017 FINDINGS: No bony destructive change or periosteal reaction. There is skin irregularity about the great toe without tracking soft tissue air. No radiopaque foreign body. No acute fracture. Bones are under mineralized. 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 Mild cortical thickening in the fourth proximal phalanx may be sequela of remote prior injury. Prominent plantar calcaneal spur with small Achilles tendon enthesophyte. Mild generalized soft tissue edema. IMPRESSION: 1. Soft tissue irregularity about the great toe without tracking soft tissue air. No radiographic evidence of osteomyelitis. 2. 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 3. Prominent plantar calcaneal spur. Electronically Signed   By: Keith Rake M.D.   On: 04/04/2019 20:14    Procedures Procedures (including critical care time)  Medications Ordered in ED Medications  doxycycline (VIBRA-TABS) tablet 100 mg (has no administration in time range)     Initial Impression / Assessment and Plan / ED Course  I have reviewed the triage  vital signs and the nursing notes.  Pertinent labs & imaging results that were available during my care of the patient were reviewed by me and considered in my medical decision making (see chart for details).  Clinical Course as of Apr 03 2132  Tue Apr 04, 2019  2036 Discussed case with Dr. Eulis Foster and plan agreed upon.  Patient with diabetic foot ulcer for about 1 week.  The surrounding cellulitis of the skin has actually responded to the [facility].  The patient is afebrile and white count is normal.  He has normal pulses in his bilateral lower extremities but he is decreased sensation which is not new.  I am going to start the patient additionally on doxycycline.  He can continue the [facility].  He is advised to call Dr. Sharol Given immediately tomorrow morning for follow-up with orthopedics  for possible surgical debridement and/or amputation.  Counseled on diabetic diet for better control of sugars.  Wound was cleaned and soaked and dry dressing was applied   [KM]    Clinical Course User Index [KM] Arlyn DunningMcLean, Martyn Timme A, PA-C       Based on review of vitals, medical screening exam, lab work and/or imaging, there does not appear to be an acute, emergent etiology for the patient's symptoms. Counseled pt on good return precautions and encouraged both PCP and ED follow-up as needed.  Prior to discharge, I also discussed incidental imaging findings with patient in detail and advised appropriate, recommended follow-up in detail.  Clinical Impression: 1. Diabetic ulcer of toe of left foot associated with type 2 diabetes mellitus, unspecified ulcer stage (HCC)     Disposition: Discharge  Prior to providing a prescription for a controlled substance, I independently reviewed the patient's recent prescription history on the [place] Controlled Substance Reporting System. The patient had no recent or regular prescriptions and was deemed appropriate for a brief, less than 3 day prescription of narcotic for acute  analgesia.  This note was prepared with assistance of Conservation officer, historic buildingsDragon voice recognition software. Occasional wrong-word or sound-a-like substitutions may have occurred due to the inherent limitations of voice recognition software.   Final Clinical Impressions(s) / ED Diagnoses   Final diagnoses:  Diabetic ulcer of toe of left foot associated with type 2 diabetes mellitus, unspecified ulcer stage [facility](HCC)    ED Discharge Orders    None       Jeral PinchMcLean, Yaakov Saindon A, PA-C 04/04/19 2133    Mancel BaleWentz, Elliott, MD 04/04/19 [phone]

## 2019-04-04 NOTE — ED Triage Notes (Signed)
Pt presents w/Left great toe cellulitis x1.5 weeks. Sent here from [facility] for further evaluation

## 2019-04-04 NOTE — ED Notes (Signed)
Patient verbalizes understanding of discharge instructions. Opportunity for questioning and answers were provided. Armband removed by staff, pt discharged from ED.  

## 2019-04-04 NOTE — Progress Notes (Signed)
[name] 38 y.o.   Chief Complaint  Patient presents with  . Foot Swelling    left GREAT toe with redness x 2 weeks    HISTORY OF PRESENT ILLNESS: This is a 38 y.o. male complaining of redness and swelling to left great toe that started 2 weeks ago.  Progressively getting worse.  Telemedicine with me on 03/29/2019 when he complained of infection to left lower leg.  Has been on Duricef 500 mg twice a day since.  However left big toe getting worse.  States infection to his lower leg is better but infection to his left great toe is not.  Denies injury.  Patient has a history of diabetes.  HPI   Prior to Admission medications   Medication Sig Start Date End Date Taking? Authorizing Provider  cefadroxil (DURICEF) 500 MG capsule Take 1 capsule (500 mg total) by mouth 2 (two) times daily for 7 days. 03/29/19 04/05/19 Yes Adyline Huberty, Ines Bloomer, MD  lisinopril (ZESTRIL) 20 MG tablet Take 1 tablet (20 mg total) by mouth daily. 10/19/18  Yes Horald Pollen, MD  metFORMIN (GLUCOPHAGE) 1000 MG tablet Take 1 tablet (1,000 mg total) by mouth 2 (two) times daily with a meal. 10/19/18  Yes Autrey Human, Ines Bloomer, MD  Multiple Vitamins-Minerals (MULTIVITAMIN ADULT PO) Take 1 tablet by mouth daily.   Yes [provider]  atorvastatin (LIPITOR) 20 MG tablet Take 1 tablet (20 mg total) by mouth daily. Patient not taking: Reported on 04/04/2019 10/19/18   Horald Pollen, MD  glipiZIDE (GLUCOTROL) 5 MG tablet Take 1 tablet (5 mg total) by mouth 2 (two) times daily before a meal. 10/19/18 03/29/19  Horald Pollen, MD    Allergies  Allergen Reactions  . Milk-Related Compounds     Patient Active Problem List   Diagnosis Date Noted  . Uncontrolled type 2 diabetes mellitus with hyperglycemia ([place]) 05/02/2018  . Diabetes mellitus without complication ([place]) 68/34/1964  . Venous insufficiency of both lower extremities 07/30/2017  . Obesity, Class III, BMI 40-49.9 (morbid  obesity) ([place]) 07/30/2017  . Essential hypertension 09/03/2016    Past Medical History:  Diagnosis Date  . Diabetes mellitus without complication ([place])   . Hypertension     History reviewed. No pertinent surgical history.  Social History   Socioeconomic History  . Marital status: Single    Spouse name: Not on file  . Number of children: Not on file  . Years of education: Not on file  . Highest education level: Not on file  Occupational History  . Occupation: Unemployed  Social Needs  . Financial resource strain: Not on file  . Food insecurity    Worry: Not on file    Inability: Not on file  . Transportation needs    Medical: Not on file    Non-medical: Not on file  Tobacco Use  . Smoking status: Never Smoker  . Smokeless tobacco: Never Used  Substance and Sexual Activity  . Alcohol use: No    Alcohol/week: 0.0 standard drinks  . Drug use: No  . Sexual activity: Not on file  Lifestyle  . Physical activity    Days per week: Not on file    Minutes per session: Not on file  . Stress: Not on file  Relationships  . Social Herbalist on phone: Not on file    Gets together: Not on file    Attends religious service: Not on file    Active member of  club or organization: Not on file    Attends meetings of clubs or organizations: Not on file    Relationship status: Not on file  . Intimate partner violence    Fear of current or ex partner: Not on file    Emotionally abused: Not on file    Physically abused: Not on file    Forced sexual activity: Not on file  Other Topics Concern  . Not on file  Social History Narrative   Live with parents in [place].     Family History  Problem Relation Age of Onset  . Diabetes Mother   . Hyperlipidemia Father   . Hypertension Father      Review of Systems  Constitutional: Negative.  Negative for chills and fever.  HENT: Negative.  Negative for congestion and sore throat.   Respiratory: Negative.  Negative for  cough and shortness of breath.   Cardiovascular: Negative.  Negative for chest pain and palpitations.  Gastrointestinal: Negative.  Negative for abdominal pain, blood in stool, diarrhea, nausea and vomiting.  Musculoskeletal: Negative for myalgias.  Skin: Positive for rash.  Neurological: Negative for dizziness and headaches.  All other systems reviewed and are negative.    Vitals:   04/04/19 1052  BP: 104/71  Pulse: 95  Resp: 16  Temp: 98.1 F (36.7 C)  SpO2: 98%     Physical Exam Vitals signs reviewed.  Constitutional:      Appearance: Normal appearance. He is obese.  HENT:     Head: Normocephalic.  Eyes:     Extraocular Movements: Extraocular movements intact.  Neck:     Musculoskeletal: Normal range of motion.  Cardiovascular:     Rate and Rhythm: Normal rate.  Pulmonary:     Effort: Pulmonary effort is normal.  Musculoskeletal: Normal range of motion.  Skin:    General: Skin is warm and dry.     Capillary Refill: Capillary refill takes less than 2 seconds.     Comments: Left lower extremity: Brownish discoloration to near ankle area compatible with recent cellulitis. Left foot: Positive erythema and swelling with breakdown of skin and active infection.  Suspected osteomyelitis and circulation compromise.  Neurological:     General: No focal deficit present.     Mental Status: He is alert and oriented to person, place, and time.  Psychiatric:        Mood and Affect: Mood normal.        Behavior: Behavior normal.          ASSESSMENT & PLAN: [name] was seen today for foot swelling.  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 Diabetic toe with infection and possible  osteomyelitis.  Possibly need surgical treatment.   Cellulitis, Adult  Cellulitis is a skin infection. The infected area is often warm, red, swollen, and sore. It occurs most often in the arms and lower legs. It is very important to get treated for this condition. What are the causes? This condition is caused by bacteria. The bacteria enter through a break in the skin, such as a cut, burn, insect bite, open sore, or crack. What increases the risk? This condition is more likely to occur in people who:  Have a weak body  defense system (immune system).  Have open cuts, burns, bites, or scrapes on the skin.  Are older than 40 years of age.  Have a blood sugar problem (diabetes).  Have a long-lasting (chronic) liver disease (cirrhosis) or kidney disease.  Are very overweight (obese).  Have a skin problem, such as: ? Itchy rash (eczema). ? Slow movement of blood in the veins (venous stasis). ? Fluid buildup below the skin (edema).  Have been treated with high-energy rays (radiation).  Use IV drugs. What are the signs or symptoms? Symptoms of this condition include:  Skin that is: ? Red. ? Streaking. ? Spotting. ? Swollen. ? Sore or painful when you touch it. ? Warm.  A fever.  Chills.  Blisters. How is this diagnosed? This condition is diagnosed based on:  Medical history.  Physical exam.  Blood tests.  Imaging tests. How is this treated? Treatment for this condition may include:  Medicines to treat infections or allergies.  Home care, such as: ? Rest. ? Placing cold or warm cloths (compresses) on the skin.  Hospital care, if the condition is very bad. Follow these instructions at home: Medicines  Take over-the-counter and prescription medicines only as told by your doctor.  If you were prescribed an antibiotic medicine, take it as told by your doctor. Do not stop taking it even if you start to feel better. General instructions   Drink enough  fluid to keep your pee (urine) pale yellow.  Do not touch or rub the infected area.  Raise (elevate) the infected area above the level of your heart while you are sitting or lying down.  Place cold or warm cloths on the area as told by your doctor.  Keep all follow-up visits as told by your doctor. This is important. Contact a doctor if:  You have a fever.  You do not start to get better after 1-2 days of treatment.  Your bone or joint under the infected area starts to hurt after the skin has healed.  Your infection comes back. This can happen in the same area or another area.  You have a swollen bump in the area.  You have new symptoms.  You feel ill and have muscle aches and pains. Get help right away if:  Your symptoms get worse.  You feel very sleepy.  You throw up (vomit) or have watery poop (diarrhea) for a long time.  You see red streaks coming from the area.  Your red area gets larger.  Your red area turns dark in color. These symptoms may represent a serious problem that is an emergency. Do not wait to see if the symptoms will go away. Get medical help right away. Call your local emergency services (911 in the [place] Do not drive yourself to the hospital. Summary  Cellulitis is a skin infection. The area is often warm, red, swollen, and sore.  This condition is treated with medicines, rest, and cold and warm cloths.  Take all medicines only as told by your doctor.  Tell your doctor if symptoms do not start to get better after 1-2 days of treatment. This information is not intended to replace advice given to you by your health care provider. Make sure you discuss any questions you have with your health care provider. Document Released: 10/28/2007 Document Revised: 09/30/2017 Document Reviewed: 09/30/2017 Elsevier Patient Education  2020 [employer].     If you have lab work done today you will be contacted with your lab results within  the next 2 weeks.   If you have not heard from [name] then please contact us. The fastest way to get your results is to register for My Chart.   IF you received an x-ray today, you will receive an invoice from [facility]. Please contact [facility] at [phone] with questions or concerns regarding your invoice.   IF you received labwork today, you will receive an invoice from [facility]. Please contact [facility] at [phone] with questions or concerns regarding your invoice.   Our billing staff will not be able to assist you with questions regarding bills from these companies.  You will be contacted with the lab results as soon as they are available. The fastest way to get your results is to activate your My Chart account. Instructions are located on the last page of this paperwork. If you have not heard from [name] regarding the results in 2 weeks, please contact this office.         [name], MD [facility]

## 2019-04-05 ENCOUNTER — Telehealth: Payer: Self-pay | Admitting: *Deleted

## 2019-04-05 ENCOUNTER — Telehealth (HOSPITAL_COMMUNITY): Payer: Self-pay | Admitting: Physician Assistant

## 2019-04-05 MED ORDER — DOXYCYCLINE HYCLATE 100 MG PO CAPS: 100.0000 mg | ORAL_CAPSULE | Freq: Two times a day (BID) | ORAL | 0 refills | Status: AC

## 2019-04-05 MED ORDER — DOXYCYCLINE HYCLATE 100 MG PO CAPS: 100.0000 mg | ORAL_CAPSULE | Freq: Two times a day (BID) | ORAL | 0 refills | Status: DC

## 2019-04-05 NOTE — Telephone Encounter (Signed)
Patient needs course of doxycycline; this was not called in at ED visit yesterday. Will e-rx.

## 2019-04-05 NOTE — Telephone Encounter (Signed)
Pt called regarding Rx for doxy sent to pharmacy.  [facility] reviewed chart and did not find Rx e-scribed or printed.  Awaiting direction form EDP Aundra Dubin).

## 2019-04-10 ENCOUNTER — Other Ambulatory Visit: Payer: Self-pay

## 2019-04-10 ENCOUNTER — Encounter: Payer: Self-pay | Admitting: Orthopedic Surgery

## 2019-04-10 ENCOUNTER — Ambulatory Visit (INDEPENDENT_AMBULATORY_CARE_PROVIDER_SITE_OTHER): Payer: Self-pay | Admitting: Orthopedic Surgery

## 2019-04-10 VITALS — Ht 73.0 in | Wt 349.0 lb

## 2019-04-10 DIAGNOSIS — M6702 Short Achilles tendon (acquired), left ankle: Secondary | ICD-10-CM

## 2019-04-10 DIAGNOSIS — E1165 Type 2 diabetes mellitus with hyperglycemia: Secondary | ICD-10-CM

## 2019-04-10 DIAGNOSIS — L97521 Non-pressure chronic ulcer of other part of left foot limited to breakdown of skin: Secondary | ICD-10-CM

## 2019-04-10 NOTE — Progress Notes (Signed)
Office Visit Note   Patient: [name]           Date of Birth: 05-Dec-1980           MRN: [id number] Visit Date: 04/10/2019              Requested by: [name], MD [address] [place],  [place] [address] PCP: [name], MD  Chief Complaint  Patient presents with  . Left Great Toe - Pain      HPI: Patient is a 38 year old gentleman who was seen for initial evaluation for diabetic insensate neuropathic ulcer plantar aspect the IP joint left great toe.  Patient was seen in the emergency department on the 10th patient was started on antibiotics.  Patient states that he has had a history of ulceration he states that this most recent ulcer developed when he was wearing steel toed shoes for work patient states he has not worked since last December.  Patient states the drainage has decreased and it does look better than it did when he was in the emergency department.  Patient is currently using soap and water cleansing and dry dressing.  Assessment & Plan: Visit Diagnoses:  1. Non-pressure chronic ulcer of other part of left foot limited to breakdown of skin (HCC)     Plan: Patient was placed in a [place] shoe.  Will start with pressure unloading continue with wound care.  Follow-Up Instructions: Return in about 2 weeks (around 04/24/2019).   Ortho Exam  Patient is alert, oriented, no adenopathy, well-dressed, normal affect, normal respiratory effort. Examination patient has a good dorsalis pedis and posterior tibial pulse he is a fungal rash involving the foot.  He has Achilles contracture with dorsiflexion 20 degrees short of neutral.  He has an ulcer beneath the plantar aspect of the IP joint.  After informed consent a 10 blade knife was used to debride the skin and soft tissue back to healthy bleeding viable granulation tissue.  After debridement the ulcer is 2 cm in diameter 3 mm deep there is good granulation tissue no exposed bone or tendon this does  not probe to bone.  Silver nitrate was used for hemostasis and a dressing was applied.  Patient does have uncontrolled type 2 diabetes with a hemoglobin A1c ranging 11-12.  Imaging: No results found. No images are attached to the encounter.  Labs: Lab Results  Component Value Date   HGBA1C 11.4 (A) 05/02/2018   HGBA1C 11.3 (A) 01/25/2018   HGBA1C 12.8 06/18/2017     Lab Results  Component Value Date   ALBUMIN 3.6 04/04/2019   ALBUMIN 4.0 07/30/2017   ALBUMIN 4.0 12/03/2016    No results found for: MG No results found for: VD25OH  No results found for: PREALBUMIN CBC EXTENDED Latest Ref Rng & Units 04/04/2019 05/02/2018 07/30/2017  WBC 4.0 - 10.5 K/uL 9.2 6.8 10.2  RBC 4.22 - 5.81 MIL/uL 5.26 5.12 5.23  HGB 13.0 - 17.0 g/dL 70.4 14.2(A) 14.7  HCT 39.0 - 52.0 % 43.8 42.2(A) 43.2  PLT 150 - 400 K/uL 387 - 279  NEUTROABS 1.7 - 7.7 K/uL 5.4 - 5.8  LYMPHSABS 0.7 - 4.0 K/uL 2.8 - 3.6     Body mass index is 46.04 kg/m.  Orders:  No orders of the defined types were placed in this encounter.  No orders of the defined types were placed in this encounter.    Procedures: No procedures performed  Clinical Data: No additional findings.  ROS:  All other systems negative, except as noted in the HPI. Review of Systems  Objective: Vital Signs: Ht 6\' 1"  (1.854 m)   Wt (!) 349 lb (158.3 kg)   BMI 46.04 kg/m   Specialty Comments:  No specialty comments available.  PMFS History: Patient Active Problem List   Diagnosis Date Noted  . Toe infection 04/04/2019  . Uncontrolled type 2 diabetes mellitus with hyperglycemia ([place]) 05/02/2018  . Diabetic ulcer of left great toe ([place]) 07/30/2017  . Venous insufficiency of both lower extremities 07/30/2017  . Obesity, Class III, BMI 40-49.9 (morbid obesity) ([place]) 07/30/2017  . Essential hypertension 09/03/2016   Past Medical History:  Diagnosis Date  . Diabetes mellitus without complication ([place])   . Hypertension      Family History  Problem Relation Age of Onset  . Diabetes Mother   . Hyperlipidemia Father   . Hypertension Father     History reviewed. No pertinent surgical history. Social History   Occupational History  . Occupation: Unemployed  Tobacco Use  . Smoking status: Never Smoker  . Smokeless tobacco: Never Used  Substance and Sexual Activity  . Alcohol use: No    Alcohol/week: 0.0 standard drinks  . Drug use: No  . Sexual activity: Not on file

## 2019-04-24 ENCOUNTER — Other Ambulatory Visit: Payer: Self-pay

## 2019-04-24 ENCOUNTER — Ambulatory Visit (INDEPENDENT_AMBULATORY_CARE_PROVIDER_SITE_OTHER): Payer: Self-pay | Admitting: Orthopedic Surgery

## 2019-04-24 ENCOUNTER — Encounter: Payer: Self-pay | Admitting: Orthopedic Surgery

## 2019-04-24 VITALS — Ht 73.0 in | Wt 349.0 lb

## 2019-04-24 DIAGNOSIS — I872 Venous insufficiency (chronic) (peripheral): Secondary | ICD-10-CM

## 2019-04-24 NOTE — Progress Notes (Signed)
Office Visit Note   Patient: [name]           Date of Birth: 1980/05/28           MRN: [id number] Visit Date: 04/24/2019              Requested by: [name], MD [address] [place],  [place] [address] PCP: [name], MD  Chief Complaint  Patient presents with  . Left Foot - Follow-up      HPI: This is a pleasant gentleman who is here for follow-up for his left foot ulcer beneath his IP joint he has been not using his shoe but he states he does not walk around much he feels it is much better he has tried debriding some of the callus himself and created a small area of abrasion distally.  This does not look infected.  He feels he is doing much better  Assessment & Plan: Visit Diagnoses: No diagnosis found.  Plan: He is improved.  He will continue to apply a Band-Aid with his triple antibiotic he wondered if he needed more oral antibiotics.  I discussed with him that he has significantly improved in the last 2 weeks and he has not been on oral antibiotics so I would not recommend this at this time we will see him in 2 more weeks.  Of course if anything changes he may contact us immediately  Follow-Up Instructions: No follow-ups on file.   Ortho Exam  Patient is alert, oriented, no adenopathy, well-dressed, normal affect, normal respiratory effort. Left foot: Mild erythema but no fluctuance.  Ulcer is significantly improved and is about 2 cm long and only a millimeter deep and does not probe deeply  Imaging: No results found. No images are attached to the encounter.  Labs: Lab Results  Component Value Date   HGBA1C 11.4 (A) 05/02/2018   HGBA1C 11.3 (A) 01/25/2018   HGBA1C 12.8 06/18/2017     Lab Results  Component Value Date   ALBUMIN 3.6 04/04/2019   ALBUMIN 4.0 07/30/2017   ALBUMIN 4.0 12/03/2016    No results found for: MG No results found for: VD25OH  No results found for: PREALBUMIN CBC EXTENDED Latest Ref Rng & Units  04/04/2019 05/02/2018 07/30/2017  WBC 4.0 - 10.5 K/uL 9.2 6.8 10.2  RBC 4.22 - 5.81 MIL/uL 5.26 5.12 5.23  HGB 13.0 - 17.0 g/dL 93.7 14.2(A) 14.7  HCT 39.0 - 52.0 % 43.8 42.2(A) 43.2  PLT 150 - 400 K/uL 387 - 279  NEUTROABS 1.7 - 7.7 K/uL 5.4 - 5.8  LYMPHSABS 0.7 - 4.0 K/uL 2.8 - 3.6     Body mass index is 46.04 kg/m.  Orders:  No orders of the defined types were placed in this encounter.  No orders of the defined types were placed in this encounter.    Procedures: No procedures performed  Clinical Data: No additional findings.  ROS:  All other systems negative, except as noted in the HPI. Review of Systems  Objective: Vital Signs: Ht 6\' 1"  (1.854 m)   Wt (!) 349 lb (158.3 kg)   BMI 46.04 kg/m   Specialty Comments:  No specialty comments available.  PMFS History: Patient Active Problem List   Diagnosis Date Noted  . Toe infection 04/04/2019  . Uncontrolled type 2 diabetes mellitus with hyperglycemia (HCC) 05/02/2018  . 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 Venous insufficiency of both lower extremities 07/30/2017  . Obesity, Class  III, BMI 40-49.9 (morbid obesity) ([place]) 07/30/2017  . Essential hypertension 09/03/2016   Past Medical History:  Diagnosis Date  . Diabetes mellitus without complication ([place])   . Hypertension     Family History  Problem Relation Age of Onset  . Diabetes Mother   . Hyperlipidemia Father   . Hypertension Father     No past surgical history on file. Social History   Occupational History  . Occupation: Unemployed  Tobacco Use  . Smoking status: Never Smoker  . Smokeless tobacco: Never Used  Substance and Sexual Activity  . Alcohol use: No    Alcohol/week: 0.0 standard drinks  . Drug use: No  . Sexual activity: Not on file

## 2019-05-08 ENCOUNTER — Encounter: Payer: Self-pay | Admitting: Orthopedic Surgery

## 2019-05-08 ENCOUNTER — Other Ambulatory Visit: Payer: Self-pay

## 2019-05-08 ENCOUNTER — Ambulatory Visit (INDEPENDENT_AMBULATORY_CARE_PROVIDER_SITE_OTHER): Payer: Self-pay | Admitting: Orthopedic Surgery

## 2019-05-08 VITALS — Ht 73.0 in | Wt 349.0 lb

## 2019-05-08 DIAGNOSIS — L97521 Non-pressure chronic ulcer of other part of left foot limited to breakdown of skin: Secondary | ICD-10-CM

## 2019-05-08 NOTE — Progress Notes (Signed)
Office Visit Note   Patient: [name]           Date of Birth: 1981/04/12           MRN: [id number] Visit Date: 05/08/2019              Requested by: [name], MD [address] [place],  [place] [address] PCP: [name], MD  Chief Complaint  Patient presents with  . Left Foot - Follow-up      HPI: Patient is a 38 year old gentleman who was seen in follow-up for ulcer left great toe plantar aspect of the IP joint.  Patient states he has had some drainage feels like the ulcer is gotten smaller.  Assessment & Plan: Visit Diagnoses:  1. Non-pressure chronic ulcer of other part of left foot limited to breakdown of skin (HCC)     Plan: Ulcer was debrided of skin and soft tissue there is healthy granulation tissue a felt relieving donut was fabricated he will wear this around the ulcer to unload the great toe.  Wash the foot with soap and water daily dry dressing Band-Aid daily  Follow-Up Instructions: Return in about 4 weeks (around 06/05/2019).   Ortho Exam  Patient is alert, oriented, no adenopathy, well-dressed, normal affect, normal respiratory effort. Examination patient does have venous swelling but no ulcers.  He has a Wagner grade 1 ulcer plantar aspect the IP joint left great toe.  After informed consent a 10 blade knife was used to debride the skin and soft tissue back to healthy viable granulation tissue.  The ulcer does not probe to bone or tendon after debridement the ulcer is 10 mm in diameter silver nitrate was used for hemostasis the ulcer is 2 mm deep.  A Band-Aid was applied and a felt relieving donut.  No new hemoglobin A1c this year last year hemoglobin A1c 11.4  Imaging: No results found. No images are attached to the encounter.  Labs: Lab Results  Component Value Date   HGBA1C 11.4 (A) 05/02/2018   HGBA1C 11.3 (A) 01/25/2018   HGBA1C 12.8 06/18/2017     Lab Results  Component Value Date   ALBUMIN 3.6 04/04/2019   ALBUMIN 4.0 07/30/2017   ALBUMIN 4.0 12/03/2016    No results found for: MG No results found for: VD25OH  No results found for: PREALBUMIN CBC EXTENDED Latest Ref Rng & Units 04/04/2019 05/02/2018 07/30/2017  WBC 4.0 - 10.5 K/uL 9.2 6.8 10.2  RBC 4.22 - 5.81 MIL/uL 5.26 5.12 5.23  HGB 13.0 - 17.0 g/dL 26.7 14.2(A) 14.7  HCT 39.0 - 52.0 % 43.8 42.2(A) 43.2  PLT 150 - 400 K/uL 387 - 279  NEUTROABS 1.7 - 7.7 K/uL 5.4 - 5.8  LYMPHSABS 0.7 - 4.0 K/uL 2.8 - 3.6     Body mass index is 46.04 kg/m.  Orders:  No orders of the defined types were placed in this encounter.  No orders of the defined types were placed in this encounter.    Procedures: No procedures performed  Clinical Data: No additional findings.  ROS:  All other systems negative, except as noted in the HPI. Review of Systems  Objective: Vital Signs: Ht 6\' 1"  (1.854 m)   Wt (!) 349 lb (158.3 kg)   BMI 46.04 kg/m   Specialty Comments:  No specialty comments available.  PMFS History: Patient Active Problem List   Diagnosis Date Noted  . Toe infection 04/04/2019  . Uncontrolled type 2 diabetes mellitus with  hyperglycemia ([place]) 05/02/2018  . Diabetic ulcer of left great toe ([place]) 07/30/2017  . Venous insufficiency of both lower extremities 07/30/2017  . Obesity, Class III, BMI 40-49.9 (morbid obesity) ([place]) 07/30/2017  . Essential hypertension 09/03/2016   Past Medical History:  Diagnosis Date  . Diabetes mellitus without complication ([place])   . Hypertension     Family History  Problem Relation Age of Onset  . Diabetes Mother   . Hyperlipidemia Father   . Hypertension Father     History reviewed. No pertinent surgical history. Social History   Occupational History  . Occupation: Unemployed  Tobacco Use  . Smoking status: Never Smoker  . Smokeless tobacco: Never Used  Substance and Sexual Activity  . Alcohol use: No    Alcohol/week: 0.0 standard drinks  . Drug use: No  . Sexual activity: Not  on file

## 2019-06-05 ENCOUNTER — Telehealth: Payer: Self-pay | Admitting: Orthopedic Surgery

## 2019-06-05 ENCOUNTER — Ambulatory Visit: Payer: Self-pay | Admitting: Orthopedic Surgery

## 2019-06-05 NOTE — Telephone Encounter (Signed)
I called pt and this is a different toe other than the one that he has been seen for. Increased pain, and has low grade temp pt states that the toe is "infected" but canceled his appt for today because he states that he is unable to drive. I advised the pt with the new findings he needs to come in the office today for eval and he said that he is unable to come in and he is unable to have any one to bring him at any time today. He said that he can come tomorrow. The pt is aware that he needs eval and sooner rather than later voiced understanding and still has decided to wait until tomorrow. To call with any changes.

## 2019-06-05 NOTE — Telephone Encounter (Signed)
Patient called.   He had an appointment today but got an infection in one of his toes over the weekend. He described his pain as unbearable to drive here. He wants advice on his next step.  Call back number: [phone]

## 2019-06-06 ENCOUNTER — Ambulatory Visit (INDEPENDENT_AMBULATORY_CARE_PROVIDER_SITE_OTHER): Payer: Self-pay | Admitting: Family

## 2019-06-06 ENCOUNTER — Encounter: Payer: Self-pay | Admitting: Family

## 2019-06-06 ENCOUNTER — Other Ambulatory Visit: Payer: Self-pay

## 2019-06-06 VITALS — Ht 73.0 in | Wt 349.0 lb

## 2019-06-06 DIAGNOSIS — L97521 Non-pressure chronic ulcer of other part of left foot limited to breakdown of skin: Secondary | ICD-10-CM

## 2019-06-06 DIAGNOSIS — E1165 Type 2 diabetes mellitus with hyperglycemia: Secondary | ICD-10-CM

## 2019-06-06 DIAGNOSIS — L97512 Non-pressure chronic ulcer of other part of right foot with fat layer exposed: Secondary | ICD-10-CM | POA: Insufficient documentation

## 2019-06-06 DIAGNOSIS — L97511 Non-pressure chronic ulcer of other part of right foot limited to breakdown of skin: Secondary | ICD-10-CM

## 2019-06-06 MED ORDER — DOXYCYCLINE HYCLATE 100 MG PO TABS: 100.0000 mg | ORAL_TABLET | Freq: Two times a day (BID) | ORAL | 0 refills | Status: DC

## 2019-06-06 NOTE — Progress Notes (Signed)
Office Visit Note   Patient: [name]           Date of Birth: [birth year]           MRN: [id number] Visit Date: 06/06/2019              Requested by: [name], MD [address] [place],  [place] [address] PCP: [name], MD  Chief Complaint  Patient presents with  . Left Foot - Follow-up      HPI: The patient is a 39 year old gentleman seen today for evaluation of bilateral great toe ulcers. Feels the right great toe ulcer is much worse. Had been having increased drainage and redness a few days ago. Has been debriding pumice stone and applying triple antibiotic ointment. Has been using felt pressure relieving donuts.  States last took an antibiotic 3 months ago.  Assessment & Plan: Visit Diagnoses: No diagnosis found.  Plan: given new donuts. Continue daily dial soap dressing changes. Apply antibiotic ointment and dressings. Minimize weight bearing. Call for worsening. Will call in doxycycline.  Follow-Up Instructions: Return in about 2 weeks (around 06/20/2019).   Ortho Exam  Patient is alert, oriented, no adenopathy, well-dressed, normal affect, normal respiratory effort. On examination of left great toe plantar ulcer beneath distal phalanx has 15 mm in diameter ulceration. This is 5 mm deep. With surrounding maceration. No active drainage. Mild erythema to the toe. Right great toe with similar ulceration. The toe is erythematous and moderately swollen. Ulcer is 2 cm in diameter some extending blistering with peeling skin medially. This has some clear drainage. One drop of purulence. Mild odor. No bleeding. Is also 5 mm deep. Does not probe to bone or tendon.    Imaging: No results found. No images are attached to the encounter.  Labs: Lab Results  Component Value Date   HGBA1C 11.4 (A) 05/02/2018   HGBA1C 11.3 (A) 01/25/2018   HGBA1C 12.8 06/18/2017     Lab Results  Component Value Date   ALBUMIN 3.6 04/04/2019   ALBUMIN 4.0  07/30/2017   ALBUMIN 4.0 12/03/2016    No results found for: MG No results found for: VD25OH  No results found for: PREALBUMIN CBC EXTENDED Latest Ref Rng & Units 04/04/2019 05/02/2018 07/30/2017  WBC 4.0 - 10.5 K/uL 9.2 6.8 10.2  RBC 4.22 - 5.81 MIL/uL 5.26 5.12 5.23  HGB 13.0 - 17.0 g/dL 83.3 14.2(A) 14.7  HCT 39.0 - 52.0 % 43.8 42.2(A) 43.2  PLT 150 - 400 K/uL 387 - 279  NEUTROABS 1.7 - 7.7 K/uL 5.4 - 5.8  LYMPHSABS 0.7 - 4.0 K/uL 2.8 - 3.6     Body mass index is 46.04 kg/m.  Orders:  No orders of the defined types were placed in this encounter.  Meds ordered this encounter  Medications  . doxycycline (VIBRA-TABS) 100 MG tablet    Sig: Take 1 tablet (100 mg total) by mouth 2 (two) times daily.    Dispense:  60 tablet    Refill:  0     Procedures: No procedures performed  Clinical Data: No additional findings.  ROS:  All other systems negative, except as noted in the HPI. Review of Systems  Constitutional: Negative for chills and fever.  Skin: Positive for color change and wound.    Objective: Vital Signs: Ht 6\' 1"  (1.854 m)   Wt (!) 349 lb (158.3 kg)   BMI 46.04 kg/m   Specialty Comments:  No specialty comments available.  PMFS History: Patient Active Problem List   Diagnosis Date Noted  . Toe infection 04/04/2019  . Uncontrolled type 2 diabetes mellitus with hyperglycemia ([place]) 05/02/2018  . Diabetic ulcer of left great toe ([place]) 07/30/2017  . Venous insufficiency of both lower extremities 07/30/2017  . Obesity, Class III, BMI 40-49.9 (morbid obesity) ([place]) 07/30/2017  . Essential hypertension 09/03/2016   Past Medical History:  Diagnosis Date  . Diabetes mellitus without complication ([place])   . Hypertension     Family History  Problem Relation Age of Onset  . Diabetes Mother   . Hyperlipidemia Father   . Hypertension Father     History reviewed. No pertinent surgical history. Social History   Occupational History  . Occupation:  Unemployed  Tobacco Use  . Smoking status: Never Smoker  . Smokeless tobacco: Never Used  Substance and Sexual Activity  . Alcohol use: No    Alcohol/week: 0.0 standard drinks  . Drug use: No  . Sexual activity: Not on file

## 2019-06-20 ENCOUNTER — Encounter: Payer: Self-pay | Admitting: Family

## 2019-06-20 ENCOUNTER — Ambulatory Visit (INDEPENDENT_AMBULATORY_CARE_PROVIDER_SITE_OTHER): Payer: Self-pay | Admitting: Family

## 2019-06-20 ENCOUNTER — Other Ambulatory Visit: Payer: Self-pay

## 2019-06-20 VITALS — Ht 73.0 in | Wt 349.0 lb

## 2019-06-20 DIAGNOSIS — I872 Venous insufficiency (chronic) (peripheral): Secondary | ICD-10-CM

## 2019-06-20 DIAGNOSIS — L97511 Non-pressure chronic ulcer of other part of right foot limited to breakdown of skin: Secondary | ICD-10-CM

## 2019-06-20 DIAGNOSIS — E1165 Type 2 diabetes mellitus with hyperglycemia: Secondary | ICD-10-CM

## 2019-06-20 DIAGNOSIS — L97521 Non-pressure chronic ulcer of other part of left foot limited to breakdown of skin: Secondary | ICD-10-CM

## 2019-06-20 NOTE — Progress Notes (Signed)
Office Visit Note   Patient: [name]           Date of Birth: 1980-11-05           MRN: [id number] Visit Date: 06/20/2019              Requested by: Horald Pollen, MD [place],  [place] [address] PCP: Horald Pollen, MD  Chief Complaint  Patient presents with  . Left Foot - Follow-up  . Right Foot - Follow-up      HPI: The patient is a 39 year old gentleman seen today in follow up for evaluation of bilateral great toe ulcers. Feels the right great toe ulcer is much improved. Has been applying triple antibiotic ointment and minimizing weight bearing. Has been using felt pressure relieving donuts. Taping them to his toes.  Assessment & Plan: Visit Diagnoses: No diagnosis found.  Plan:  Continue daily dial soap dressing changes. Apply antibiotic ointment and dressings. Minimize weight bearing. Call for worsening.  Follow-Up Instructions: No follow-ups on file.   Ortho Exam  Patient is alert, oriented, no adenopathy, well-dressed, normal affect, normal respiratory effort. On examination of left great toe plantar ulcer beneath distal phalanx has 15 mm in diameter ulceration. This is 5 mm deep. With surrounding maceration. No active drainage. Mild erythema to the toe. No swelling. Right great toe ulcer nearly healed. Superficial callus. There ulcer now 5 mm in diameter and 2 mm deep. No active drainage. Mild erythema, no edema. No sign of infection.  Imaging: No results found. No images are attached to the encounter.  Labs: Lab Results  Component Value Date   HGBA1C 11.4 (A) 05/02/2018   HGBA1C 11.3 (A) 01/25/2018   HGBA1C 12.8 06/18/2017     Lab Results  Component Value Date   ALBUMIN 3.6 04/04/2019   ALBUMIN 4.0 07/30/2017   ALBUMIN 4.0 12/03/2016    No results found for: MG No results found for: VD25OH  No results found for: PREALBUMIN CBC EXTENDED Latest Ref Rng & Units 04/04/2019 05/02/2018 07/30/2017  WBC 4.0 - 10.5 K/uL  9.2 6.8 10.2  RBC 4.22 - 5.81 MIL/uL 5.26 5.12 5.23  HGB 13.0 - 17.0 g/dL 14.3 14.2(A) 14.7  HCT 39.0 - 52.0 % 43.8 42.2(A) 43.2  PLT 150 - 400 K/uL 387 - 279  NEUTROABS 1.7 - 7.7 K/uL 5.4 - 5.8  LYMPHSABS 0.7 - 4.0 K/uL 2.8 - 3.6     Body mass index is 46.04 kg/m.  Orders:  No orders of the defined types were placed in this encounter.  No orders of the defined types were placed in this encounter.    Procedures: No procedures performed  Clinical Data: No additional findings.  ROS:  All other systems negative, except as noted in the HPI. Review of Systems  Constitutional: Negative for chills and fever.  Skin: Positive for color change and wound.    Objective: Vital Signs: Ht 6\' 1"  (1.854 m)   Wt (!) 349 lb (158.3 kg)   BMI 46.04 kg/m   Specialty Comments:  No specialty comments available.  PMFS History: Patient Active Problem List   Diagnosis Date Noted  . Right foot ulcer, limited to breakdown of skin ([facility]) 06/06/2019  . Non-pressure chronic ulcer of other part of left foot limited to breakdown of skin ([place] [facility]) 06/06/2019  . Toe infection 04/04/2019  . Uncontrolled type 2 diabetes mellitus with hyperglycemia ([place]) 05/02/2018  . Diabetic ulcer of left great toe ([facility]) 07/30/2017  .  Venous insufficiency of both lower extremities 07/30/2017  . Obesity, Class III, BMI 40-49.9 (morbid obesity) ([facility]) 07/30/2017  . Essential hypertension 09/03/2016   Past Medical History:  Diagnosis Date  . Diabetes mellitus without complication ([facility])   . Hypertension     Family History  Problem Relation Age of Onset  . Diabetes Mother   . Hyperlipidemia Father   . Hypertension Father     No past surgical history on file. Social History   Occupational History  . Occupation: Unemployed  Tobacco Use  . Smoking status: Never Smoker  . Smokeless tobacco: Never Used  Substance and Sexual Activity  . Alcohol use: No    Alcohol/week: 0.0 standard drinks  . Drug use: No  .  Sexual activity: Not on file

## 2019-07-11 ENCOUNTER — Ambulatory Visit (INDEPENDENT_AMBULATORY_CARE_PROVIDER_SITE_OTHER): Payer: Self-pay | Admitting: Family

## 2019-07-11 ENCOUNTER — Other Ambulatory Visit: Payer: Self-pay

## 2019-07-11 ENCOUNTER — Encounter: Payer: Self-pay | Admitting: Family

## 2019-07-11 VITALS — Ht 73.0 in | Wt 349.0 lb

## 2019-07-11 DIAGNOSIS — E1165 Type 2 diabetes mellitus with hyperglycemia: Secondary | ICD-10-CM

## 2019-07-11 DIAGNOSIS — L97521 Non-pressure chronic ulcer of other part of left foot limited to breakdown of skin: Secondary | ICD-10-CM

## 2019-07-11 DIAGNOSIS — I872 Venous insufficiency (chronic) (peripheral): Secondary | ICD-10-CM

## 2019-07-11 DIAGNOSIS — L97511 Non-pressure chronic ulcer of other part of right foot limited to breakdown of skin: Secondary | ICD-10-CM

## 2019-07-11 MED ORDER — NITROGLYCERIN 0.2 MG/HR TD PT24: 0.2000 mg | MEDICATED_PATCH | Freq: Every day | TRANSDERMAL | 12 refills | Status: DC

## 2019-07-11 NOTE — Progress Notes (Signed)
Office Visit Note   Patient: [name]           Date of Birth: 1981-04-17           MRN: [id number] Visit Date: 07/11/2019              Requested by: [name], MD [address] [place],  [place] [address] PCP: [name], MD  Chief Complaint  Patient presents with  . Right Foot - Follow-up  . Left Foot - Follow-up      HPI: The patient is a 39 year old gentleman seen today in follow up for evaluation of bilateral great toe ulcers. Feels the right great toe ulcer is much improved. Has been applying triple antibiotic ointment and minimizing weight bearing. Has been using felt pressure relieving donuts. Taping them to his toes.  Has completed his doxycycline course.  Today is in regular shoewear using his felt pressure relieving donuts.  Assessment & Plan: Visit Diagnoses:  1. Non-pressure chronic ulcer of other part of left foot limited to breakdown of skin ([facility])   2. Right foot ulcer, limited to breakdown of skin ([facility])   3. Venous insufficiency of both lower extremities   4. Uncontrolled type 2 diabetes mellitus with hyperglycemia ([facility])     Plan:  Continue daily dial soap cleansing and antibiotic ointment dressing changes. Pressure relief with donuts. Minimize weight bearing. Call for worsening.  Follow-Up Instructions: Return in about 4 weeks (around 08/08/2019).   Ortho Exam  Patient is alert, oriented, no adenopathy, well-dressed, normal affect, normal respiratory effort. On examination of left great toe plantar ulcer beneath distal phalanx is 15 mm in diameter ulceration.  There is surrounding epiboly and nonviable tissue this is debrided with a 10 blade knife back to viable tissue.  There is  5 mm of depth. No active drainage. Mild erythema to the toe. No swelling. Right great toe ulcer nearly healed.  Surrounding callus was debrided as well with a 10 blade knife back to viable tissue.  Callus. There ulcer now 5 mm in diameter and 2 mm deep.  No active drainage. Mild erythema, no edema. No sign of infection. Ulcers are stable. Imaging: No results found. No images are attached to the encounter.  Labs: Lab Results  Component Value Date   HGBA1C 11.4 (A) 05/02/2018   HGBA1C 11.3 (A) 01/25/2018   HGBA1C 12.8 06/18/2017     Lab Results  Component Value Date   ALBUMIN 3.6 04/04/2019   ALBUMIN 4.0 07/30/2017   ALBUMIN 4.0 12/03/2016    No results found for: MG No results found for: VD25OH  No results found for: PREALBUMIN CBC EXTENDED Latest Ref Rng & Units 04/04/2019 05/02/2018 07/30/2017  WBC 4.0 - 10.5 K/uL 9.2 6.8 10.2  RBC 4.22 - 5.81 MIL/uL 5.26 5.12 5.23  HGB 13.0 - 17.0 g/dL 69.6 14.2(A) 14.7  HCT 39.0 - 52.0 % 43.8 42.2(A) 43.2  PLT 150 - 400 K/uL 387 - 279  NEUTROABS 1.7 - 7.7 K/uL 5.4 - 5.8  LYMPHSABS 0.7 - 4.0 K/uL 2.8 - 3.6     Body mass index is 46.04 kg/m.  Orders:  No orders of the defined types were placed in this encounter.  No orders of the defined types were placed in this encounter.    Procedures: No procedures performed  Clinical Data: No additional findings.  ROS:  All other systems negative, except as noted in the HPI. Review of Systems  Constitutional: Negative for chills  and fever.  Skin: Positive for color change and wound.    Objective: Vital Signs: Ht 6\' 1"  (1.854 m)   Wt (!) 349 lb (158.3 kg)   BMI 46.04 kg/m   Specialty Comments:  No specialty comments available.  PMFS History: Patient Active Problem List   Diagnosis Date Noted  . Right foot ulcer, limited to breakdown of skin ([place]) 06/06/2019  . Non-pressure chronic ulcer of other part of left foot limited to breakdown of skin ([place]) 06/06/2019  . Toe infection 04/04/2019  . Uncontrolled type 2 diabetes mellitus with hyperglycemia ([place]) 05/02/2018  . Diabetic ulcer of left great toe ([place]) 07/30/2017  . Venous insufficiency of both lower extremities 07/30/2017  . Obesity, Class III, BMI 40-49.9 (morbid  obesity) ([place]) 07/30/2017  . Essential hypertension 09/03/2016   Past Medical History:  Diagnosis Date  . Diabetes mellitus without complication ([place])   . Hypertension     Family History  Problem Relation Age of Onset  . Diabetes Mother   . Hyperlipidemia Father   . Hypertension Father     History reviewed. No pertinent surgical history. Social History   Occupational History  . Occupation: Unemployed  Tobacco Use  . Smoking status: Never Smoker  . Smokeless tobacco: Never Used  Substance and Sexual Activity  . Alcohol use: No    Alcohol/week: 0.0 standard drinks  . Drug use: No  . Sexual activity: Not on file

## 2019-08-08 ENCOUNTER — Other Ambulatory Visit: Payer: Self-pay

## 2019-08-08 ENCOUNTER — Ambulatory Visit (INDEPENDENT_AMBULATORY_CARE_PROVIDER_SITE_OTHER): Payer: Self-pay | Admitting: Physician Assistant

## 2019-08-08 ENCOUNTER — Encounter: Payer: Self-pay | Admitting: Physician Assistant

## 2019-08-08 VITALS — Ht 73.0 in | Wt 349.0 lb

## 2019-08-08 DIAGNOSIS — I872 Venous insufficiency (chronic) (peripheral): Secondary | ICD-10-CM

## 2019-08-08 NOTE — Progress Notes (Signed)
Office Visit Note   Patient: [name]           Date of Birth: March 13, 1981           MRN: [id number] Visit Date: 08/08/2019              Requested by: [name], MD [address] [name],  [place] [address] PCP: [name], MD  Chief Complaint  Patient presents with  . Right Foot - Follow-up  . Left Foot - Follow-up      HPI: The patient is a 39 year old gentleman who follows up today for his bilateral great toe ulcers.  He develops thick calluses around these which are trimmed periodically.  He feels both of these are healing up quite well.  On the right side he just has a thickened callus he needs to have trimmed on the left side he is using a nitro patch and still has a very small area of ulceration.  He denies any foul odor or pain.  He applies Band-Aids  Assessment & Plan: Visit Diagnoses: No diagnosis found.  Plan: He will follow-up in 1 month.  He will continue to apply Band-Aids and wash daily with mild soap and water.  Follow-Up Instructions: No follow-ups on file.   Ortho Exam  Patient is alert, oriented, no adenopathy, well-dressed, normal affect, normal respiratory effort. Focused examination of the right side demonstrates a thickened callus.  The ulcer is about a millimeter in diameter and barely probes deep to skin.  There is healthy tissue overlying.  No foul odor no surrounding cellulitis after obtaining verbal permission I trimmed back the callus surrounding it to healthy soft skin on the left side he has a ulcer that is approximately 2 mm in diameter does not probe deeply with healthy granulation tissue.  The callus surrounding was also trimmed back to a healthy stable soft surface  Imaging: No results found. No images are attached to the encounter.  Labs: Lab Results  Component Value Date   HGBA1C 11.4 (A) 05/02/2018   HGBA1C 11.3 (A) 01/25/2018   HGBA1C 12.8 06/18/2017     Lab Results  Component Value Date   ALBUMIN  3.6 04/04/2019   ALBUMIN 4.0 07/30/2017   ALBUMIN 4.0 12/03/2016    No results found for: MG No results found for: VD25OH  No results found for: PREALBUMIN CBC EXTENDED Latest Ref Rng & Units 04/04/2019 05/02/2018 07/30/2017  WBC 4.0 - 10.5 K/uL 9.2 6.8 10.2  RBC 4.22 - 5.81 MIL/uL 5.26 5.12 5.23  HGB 13.0 - 17.0 g/dL 81.8 14.2(A) 14.7  HCT 39.0 - 52.0 % 43.8 42.2(A) 43.2  PLT 150 - 400 K/uL 387 - 279  NEUTROABS 1.7 - 7.7 K/uL 5.4 - 5.8  LYMPHSABS 0.7 - 4.0 K/uL 2.8 - 3.6     Body mass index is 46.04 kg/m.  Orders:  No orders of the defined types were placed in this encounter.  No orders of the defined types were placed in this encounter.    Procedures: No procedures performed  Clinical Data: No additional findings.  ROS:  All other systems negative, except as noted in the HPI. Review of Systems  Objective: Vital Signs: Ht 6\' 1"  (1.854 m)   Wt (!) 349 lb (158.3 kg)   BMI 46.04 kg/m   Specialty Comments:  No specialty comments available.  PMFS History: Patient Active Problem List   Diagnosis Date Noted  . Right foot ulcer, limited to breakdown of skin ([facility])  06/06/2019  . Non-pressure chronic ulcer of other part of left foot limited to breakdown of skin ([place]) 06/06/2019  . Toe infection 04/04/2019  . Uncontrolled type 2 diabetes mellitus with hyperglycemia ([facility]) 05/02/2018  . Diabetic ulcer of left great toe ([facility]) 07/30/2017  . Venous insufficiency of both lower extremities 07/30/2017  . Obesity, Class III, BMI 40-49.9 (morbid obesity) ([facility]) 07/30/2017  . Essential hypertension 09/03/2016   Past Medical History:  Diagnosis Date  . Diabetes mellitus without complication ([facility] [place])   . Hypertension     Family History  Problem Relation Age of Onset  . Diabetes Mother   . Hyperlipidemia Father   . Hypertension Father     No past surgical history on file. Social History   Occupational History  . Occupation: Unemployed  Tobacco Use  . Smoking status:  Never Smoker  . Smokeless tobacco: Never Used  Substance and Sexual Activity  . Alcohol use: No    Alcohol/week: 0.0 standard drinks  . Drug use: No  . Sexual activity: Not on file

## 2019-09-05 ENCOUNTER — Encounter: Payer: Self-pay | Admitting: Physician Assistant

## 2019-09-05 ENCOUNTER — Other Ambulatory Visit: Payer: Self-pay

## 2019-09-05 ENCOUNTER — Ambulatory Visit (INDEPENDENT_AMBULATORY_CARE_PROVIDER_SITE_OTHER): Payer: Self-pay | Admitting: Physician Assistant

## 2019-09-05 DIAGNOSIS — L97529 Non-pressure chronic ulcer of other part of left foot with unspecified severity: Secondary | ICD-10-CM

## 2019-09-05 DIAGNOSIS — E11621 Type 2 diabetes mellitus with foot ulcer: Secondary | ICD-10-CM

## 2019-09-05 MED ORDER — DOXYCYCLINE HYCLATE 100 MG PO TABS: 100.0000 mg | ORAL_TABLET | Freq: Two times a day (BID) | ORAL | 0 refills | Status: DC

## 2019-09-05 NOTE — Progress Notes (Signed)
Office Visit Note   Patient: [name]           Date of Birth: 27-Apr-1981           MRN: [id number] Visit Date: 09/05/2019              Requested by: [name], MD [address] [place],  [place] [address] PCP: [name], MD  No chief complaint on file.     HPI: This is a pleasant gentleman who we have been following for bilateral great toe ulcers.  He has been doing dressing changes in between.  He said on the right toe he is a bit concerned as he had to stand on his foot for a long period of time and noticed that he is a significant amount of skin maceration and some increasing redness.  No drainage or foul odor he states the left side is about the same  Assessment & Plan: Visit Diagnoses: No diagnosis found.  Plan: He says he has not seen Dr. Lajoyce Corners and while he will follow-up in a week for reevaluation I will give him a short course of doxycycline because of some of the increased redness in his great toe.  Follow-up sooner if any concerns  Follow-Up Instructions: No follow-ups on file.   Ortho Exam  Patient is alert, oriented, no adenopathy, well-dressed, normal affect, normal respiratory effort. Right great toe there is a ulcer with some skin delamination and maceration.  There is no drainage no foul odor no surrounding cellulitis some mild erythema  Left great toe similar ulcer to the right with some skin maceration and delamination of the skin.  No odor no fluctuance or cellulitis  After obtaining verbal consent some of the macerated tissue was unroofed and debrided to some healthy bleeding tissue  Imaging: No results found. No images are attached to the encounter.  Labs: Lab Results  Component Value Date   HGBA1C 11.4 (A) 05/02/2018   HGBA1C 11.3 (A) 01/25/2018   HGBA1C 12.8 06/18/2017     Lab Results  Component Value Date   ALBUMIN 3.6 04/04/2019   ALBUMIN 4.0 07/30/2017   ALBUMIN 4.0 12/03/2016    No results found for:  MG No results found for: VD25OH  No results found for: PREALBUMIN CBC EXTENDED Latest Ref Rng & Units 04/04/2019 05/02/2018 07/30/2017  WBC 4.0 - 10.5 K/uL 9.2 6.8 10.2  RBC 4.22 - 5.81 MIL/uL 5.26 5.12 5.23  HGB 13.0 - 17.0 g/dL 01.0 14.2(A) 14.7  HCT 39.0 - 52.0 % 43.8 42.2(A) 43.2  PLT 150 - 400 K/uL 387 - 279  NEUTROABS 1.7 - 7.7 K/uL 5.4 - 5.8  LYMPHSABS 0.7 - 4.0 K/uL 2.8 - 3.6     There is no height or weight on file to calculate BMI.  Orders:  No orders of the defined types were placed in this encounter.  Meds ordered this encounter  Medications  . doxycycline (VIBRA-TABS) 100 MG tablet    Sig: Take 1 tablet (100 mg total) by mouth 2 (two) times daily.    Dispense:  60 tablet    Refill:  0     Procedures: No procedures performed  Clinical Data: No additional findings.  ROS:  All other systems negative, except as noted in the HPI. Review of Systems  Objective: Vital Signs: There were no vitals taken for this visit.  Specialty Comments:  No specialty comments available.  PMFS History: Patient Active Problem List   Diagnosis Date Noted  .  Right foot ulcer, limited to breakdown of skin ([facility]) 06/06/2019  . Non-pressure chronic ulcer of other part of left foot limited to breakdown of skin ([facility]) 06/06/2019  . Toe infection 04/04/2019  . Uncontrolled type 2 diabetes mellitus with hyperglycemia ([place]) 05/02/2018  . Diabetic ulcer of left great toe ([place]) 07/30/2017  . Venous insufficiency of both lower extremities 07/30/2017  . Obesity, Class III, BMI 40-49.9 (morbid obesity) ([facility]) 07/30/2017  . Essential hypertension 09/03/2016   Past Medical History:  Diagnosis Date  . Diabetes mellitus without complication ([place])   . Hypertension     Family History  Problem Relation Age of Onset  . Diabetes Mother   . Hyperlipidemia Father   . Hypertension Father     No past surgical history on file. Social History   Occupational History  . Occupation: Unemployed   Tobacco Use  . Smoking status: Never Smoker  . Smokeless tobacco: Never Used  Substance and Sexual Activity  . Alcohol use: No    Alcohol/week: 0.0 standard drinks  . Drug use: No  . Sexual activity: Not on file

## 2019-09-07 ENCOUNTER — Other Ambulatory Visit (HOSPITAL_COMMUNITY): Payer: Self-pay | Admitting: Internal Medicine

## 2019-09-19 ENCOUNTER — Other Ambulatory Visit: Payer: Self-pay

## 2019-09-19 ENCOUNTER — Ambulatory Visit (INDEPENDENT_AMBULATORY_CARE_PROVIDER_SITE_OTHER): Payer: Self-pay | Admitting: Orthopedic Surgery

## 2019-09-19 DIAGNOSIS — E11621 Type 2 diabetes mellitus with foot ulcer: Secondary | ICD-10-CM

## 2019-09-19 DIAGNOSIS — L97529 Non-pressure chronic ulcer of other part of left foot with unspecified severity: Secondary | ICD-10-CM

## 2019-09-19 DIAGNOSIS — M6702 Short Achilles tendon (acquired), left ankle: Secondary | ICD-10-CM

## 2019-09-19 DIAGNOSIS — E1165 Type 2 diabetes mellitus with hyperglycemia: Secondary | ICD-10-CM

## 2019-09-19 DIAGNOSIS — L97511 Non-pressure chronic ulcer of other part of right foot limited to breakdown of skin: Secondary | ICD-10-CM

## 2019-09-20 ENCOUNTER — Encounter: Payer: Self-pay | Admitting: Orthopedic Surgery

## 2019-09-20 NOTE — Progress Notes (Signed)
Office Visit Note   Patient: [name]           Date of Birth: 1981/05/04           MRN: [id number] Visit Date: 09/19/2019              Requested by: [name], MD [address] [place],  [place] [address] PCP: [name], MD  Chief Complaint  Patient presents with  . Left Great Toe - Follow-up  . 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  Patient complains of persistent ulcerations.  Assessment & Plan: Visit Diagnoses:  1. 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 Right foot ulcer, limited to breakdown of skin (HCC)   3. Uncontrolled type 2 diabetes mellitus with hyperglycemia (HCC)   4. Achilles tendon contracture, left     Plan: Ulcers were debrided patient was given instructions and demonstrated Achilles stretching.  He will complete his course of doxycycline.  Follow-Up Instructions: Return in about 2 weeks (around 10/03/2019).   Ortho Exam  Patient is alert, oriented, no adenopathy, well-dressed, normal affect, normal respiratory effort. Examination patient has good dorsalis pedis pulses bilaterally.  Examination with patient's knee extended he has significant Achilles contracture with dorsiflexion 20 degrees short of neutral bilaterally.  He also has hallux rigidus of the great toes bilaterally with dorsiflexion only about 20 degrees.  He has a Wagner grade 1 ulcer on the plantar aspect of the IP joint of the both great toe secondary to the Achilles contracture and hallux rigidus.  The ulcers on both great toes are 1 cm in diameter prior to debriding.  After informed consent a 10 blade knife was used to debride the skin and soft tissue back to healthy viable granulation tissue on both feet.  The ulcer on the plantar aspect of the great toes after debridement is 2 cm in diameter and 3 mm  deep there is no exposed bone or tendon.  Silver nitrate was used hemostasis Iodosorb and a Band-Aid was applied.  Patient's last hemoglobin A1c was 11.4, 3 years ago.  Imaging: No results found. No images are attached to the encounter.  Labs: Lab Results  Component Value Date   HGBA1C 11.4 (A) 05/02/2018   HGBA1C 11.3 (A) 01/25/2018   HGBA1C 12.8 06/18/2017     Lab Results  Component Value Date   ALBUMIN 3.6 04/04/2019   ALBUMIN 4.0 07/30/2017   ALBUMIN 4.0 12/03/2016    No results found for: MG No results found for: VD25OH  No results found for: PREALBUMIN CBC EXTENDED Latest Ref Rng & Units 04/04/2019 05/02/2018 07/30/2017  WBC 4.0 - 10.5 K/uL 9.2 6.8 10.2  RBC 4.22 - 5.81 MIL/uL 5.26 5.12 5.23  HGB 13.0 - 17.0 g/dL 07.3 14.2(A) 14.7  HCT 39.0 - 52.0 % 43.8 42.2(A) 43.2  PLT 150 - 400 K/uL 387 - 279  NEUTROABS 1.7 - 7.7 K/uL 5.4 - 5.8  LYMPHSABS 0.7 - 4.0 K/uL 2.8 - 3.6     There is no height or weight on file to calculate BMI.  Orders:  No orders of the defined types were placed in this encounter.  No orders of the defined types were placed in this encounter.    Procedures: No procedures performed  Clinical Data: No additional findings.  ROS:  All  other systems negative, except as noted in the HPI. Review of Systems  Objective: Vital Signs: There were no vitals taken for this visit.  Specialty Comments:  No specialty comments available.  PMFS History: Patient Active Problem List   Diagnosis Date Noted  . Right foot ulcer, limited to breakdown of skin ([place]) 06/06/2019  . Non-pressure chronic ulcer of other part of left foot limited to breakdown of skin ([place]) 06/06/2019  . Toe infection 04/04/2019  . Uncontrolled type 2 diabetes mellitus with hyperglycemia ([place]) 05/02/2018  . Diabetic ulcer of left great toe ([place]) 07/30/2017  . Venous insufficiency of both lower extremities 07/30/2017  . Obesity, Class III, BMI 40-49.9 (morbid obesity) ([place])  07/30/2017  . Essential hypertension 09/03/2016   Past Medical History:  Diagnosis Date  . Diabetes mellitus without complication ([place])   . Hypertension     Family History  Problem Relation Age of Onset  . Diabetes Mother   . Hyperlipidemia Father   . Hypertension Father     History reviewed. No pertinent surgical history. Social History   Occupational History  . Occupation: Unemployed  Tobacco Use  . Smoking status: Never Smoker  . Smokeless tobacco: Never Used  Substance and Sexual Activity  . Alcohol use: No    Alcohol/week: 0.0 standard drinks  . Drug use: No  . Sexual activity: Not on file

## 2019-10-03 ENCOUNTER — Ambulatory Visit (INDEPENDENT_AMBULATORY_CARE_PROVIDER_SITE_OTHER): Payer: Self-pay | Admitting: Orthopedic Surgery

## 2019-10-03 ENCOUNTER — Other Ambulatory Visit: Payer: Self-pay

## 2019-10-03 DIAGNOSIS — L97511 Non-pressure chronic ulcer of other part of right foot limited to breakdown of skin: Secondary | ICD-10-CM

## 2019-10-03 DIAGNOSIS — L97529 Non-pressure chronic ulcer of other part of left foot with unspecified severity: Secondary | ICD-10-CM

## 2019-10-03 DIAGNOSIS — E11621 Type 2 diabetes mellitus with foot ulcer: Secondary | ICD-10-CM

## 2019-10-10 ENCOUNTER — Encounter: Payer: Self-pay | Admitting: Orthopedic Surgery

## 2019-10-10 NOTE — Progress Notes (Signed)
Office Visit Note   Patient: [name]           Date of Birth: 1980/07/09           MRN: [id number] Visit Date: 10/03/2019              Requested by: Horald Pollen, MD [place],  [place] [address] PCP: Horald Pollen, MD  Chief Complaint  Patient presents with  . Left Foot - Follow-up  . Right Foot - Follow-up      HPI: Patient is a 39 year old gentleman who presents in follow-up for ulcerations both feet.  Patient states that he is doing well states there is minimal drainage he has completed his doxycycline.  Assessment & Plan: Visit Diagnoses:  1. Diabetic ulcer of left great toe ([place])   2. Right foot ulcer, limited to breakdown of skin ([place])     Plan: Recommended Achilles stretching recommended discontinuing the peroxide on the wounds with soap and water minimize weightbearing on the forefoot  Follow-Up Instructions: Return in about 2 weeks (around 10/17/2019).   Ortho Exam  Patient is alert, oriented, no adenopathy, well-dressed, normal affect, normal respiratory effort. Examination patient has Achilles tightness bilaterally with dorsiflexion to neutral Achilles stretching was reviewed.  Patient has a good dorsalis pedis pulse bilaterally.  The ulcer on the left hip appears much healthier there is still some redness and swelling around the right great toe.  The left great toe ulcer measures 5 mm in diameter.  After informed consent a 10 blade knife was used to debride the skin and soft tissue back to healthy viable bleeding granulation tissue silver nitrate was used hemostasis after debridement the ulcer is 10 mm in diameter 5 mm deep.  Right great toe ulcer was 10 mm in diameter.  After debridement with a 10 blade knife and informed consent the right great toe ulcer was 2 cm in diameter 10 mm deep after debridement.  There was no exposed bone or tendon no abscess there is healthy granulation tissue silver nitrate was used hemostasis dry  dressings were applied to both feet.  Imaging: No results found. No images are attached to the encounter.  Labs: Lab Results  Component Value Date   HGBA1C 11.4 (A) 05/02/2018   HGBA1C 11.3 (A) 01/25/2018   HGBA1C 12.8 06/18/2017     Lab Results  Component Value Date   ALBUMIN 3.6 04/04/2019   ALBUMIN 4.0 07/30/2017   ALBUMIN 4.0 12/03/2016    No results found for: MG No results found for: VD25OH  No results found for: PREALBUMIN CBC EXTENDED Latest Ref Rng & Units 04/04/2019 05/02/2018 07/30/2017  WBC 4.0 - 10.5 K/uL 9.2 6.8 10.2  RBC 4.22 - 5.81 MIL/uL 5.26 5.12 5.23  HGB 13.0 - 17.0 g/dL 14.3 14.2(A) 14.7  HCT 39.0 - 52.0 % 43.8 42.2(A) 43.2  PLT 150 - 400 K/uL 387 - 279  NEUTROABS 1.7 - 7.7 K/uL 5.4 - 5.8  LYMPHSABS 0.7 - 4.0 K/uL 2.8 - 3.6     There is no height or weight on file to calculate BMI.  Orders:  No orders of the defined types were placed in this encounter.  No orders of the defined types were placed in this encounter.    Procedures: No procedures performed  Clinical Data: No additional findings.  ROS:  All other systems negative, except as noted in the HPI. Review of Systems  Objective: Vital Signs: There were no vitals taken for this  visit.  Specialty Comments:  No specialty comments available.  PMFS History: Patient Active Problem List   Diagnosis Date Noted  . Right foot ulcer, limited to breakdown of skin (HCC) 06/06/2019  . Non-pressure chronic ulcer of other part of left foot limited to breakdown of skin (HCC) 06/06/2019  . Toe infection 04/04/2019  . Uncontrolled type 2 diabetes mellitus with hyperglycemia (HCC) 05/02/2018  . 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 Venous insufficiency of both lower extremities 07/30/2017  . Obesity, Class III, BMI 40-49.9 (morbid obesity) (HCC) 07/30/2017  . Essential hypertension 09/03/2016   Past Medical History:  Diagnosis Date  . Diabetes mellitus without complication  (HCC)   . Hypertension     Family History  Problem Relation Age of Onset  . Diabetes Mother   . Hyperlipidemia Father   . Hypertension Father     History reviewed. No pertinent surgical history. Social History   Occupational History  . Occupation: Unemployed  Tobacco Use  . Smoking status: Never Smoker  . Smokeless tobacco: Never Used  Substance and Sexual Activity  . Alcohol use: No    Alcohol/week: 0.0 standard drinks  . Drug use: No  . Sexual activity: Not on file

## 2019-10-17 ENCOUNTER — Other Ambulatory Visit: Payer: Self-pay

## 2019-10-17 ENCOUNTER — Ambulatory Visit (INDEPENDENT_AMBULATORY_CARE_PROVIDER_SITE_OTHER): Payer: Self-pay | Admitting: Physician Assistant

## 2019-10-17 ENCOUNTER — Encounter: Payer: Self-pay | Admitting: Orthopedic Surgery

## 2019-10-17 VITALS — Ht 73.0 in | Wt 349.0 lb

## 2019-10-17 DIAGNOSIS — E11621 Type 2 diabetes mellitus with foot ulcer: Secondary | ICD-10-CM

## 2019-10-17 DIAGNOSIS — L97529 Non-pressure chronic ulcer of other part of left foot with unspecified severity: Secondary | ICD-10-CM

## 2019-10-17 DIAGNOSIS — L97511 Non-pressure chronic ulcer of other part of right foot limited to breakdown of skin: Secondary | ICD-10-CM

## 2019-10-17 NOTE — Progress Notes (Signed)
Office Visit Note   Patient: [name]           Date of Birth: [birth year]           MRN: [id number] Visit Date: 10/17/2019              Requested by: Horald Pollen, MD [place],  [place] [address] PCP: Horald Pollen, MD  Chief Complaint  Patient presents with  . Right Foot - Follow-up  . Left Foot - Follow-up      HPI: This is a pleasant gentleman who comes in today for follow-up on his bilateral great toe ulcers.  He feels he is much better than his previous visit 2 weeks ago.  He has been working on stretching.  Assessment & Plan: Visit Diagnoses: No diagnosis found.  Plan: Patient will continue to place a Band-Aid with a antibiotic on his toes.  Continue to work on Achilles stretching.  Follow-up in 3 weeks or sooner if any changes  Follow-Up Instructions: No follow-ups on file.   Ortho Exam  Patient is alert, oriented, no adenopathy, well-dressed, normal affect, normal respiratory effort. The ulcer on the left great toe has almost completely resolved and is at skin level.  There is no surrounding erythema no drainage there was some surrounding after obtaining verbal consent thick callus that was debrided to some healthy soft tissue.  The right ulcer is approximately 3 cm in diameter and about half a centimeter deep.  Some macerated tissue was debrided around the wound edges as well as some callus on the periphery.  Imaging: No results found. No images are attached to the encounter.  Labs: Lab Results  Component Value Date   HGBA1C 11.4 (A) 05/02/2018   HGBA1C 11.3 (A) 01/25/2018   HGBA1C 12.8 06/18/2017     Lab Results  Component Value Date   ALBUMIN 3.6 04/04/2019   ALBUMIN 4.0 07/30/2017   ALBUMIN 4.0 12/03/2016    No results found for: MG No results found for: VD25OH  No results found for: PREALBUMIN CBC EXTENDED Latest Ref Rng & Units 04/04/2019 05/02/2018 07/30/2017  WBC 4.0 - 10.5 K/uL 9.2 6.8 10.2  RBC 4.22 -  5.81 MIL/uL 5.26 5.12 5.23  HGB 13.0 - 17.0 g/dL 14.3 14.2(A) 14.7  HCT 39.0 - 52.0 % 43.8 42.2(A) 43.2  PLT 150 - 400 K/uL 387 - 279  NEUTROABS 1.7 - 7.7 K/uL 5.4 - 5.8  LYMPHSABS 0.7 - 4.0 K/uL 2.8 - 3.6     Body mass index is 46.04 kg/m.  Orders:  No orders of the defined types were placed in this encounter.  No orders of the defined types were placed in this encounter.    Procedures: No procedures performed  Clinical Data: No additional findings.  ROS:  All other systems negative, except as noted in the HPI. Review of Systems  Objective: Vital Signs: Ht 6\' 1"  (1.854 m)   Wt (!) 349 lb (158.3 kg)   BMI 46.04 kg/m   Specialty Comments:  No specialty comments available.  PMFS History: Patient Active Problem List   Diagnosis Date Noted  . Right foot ulcer, limited to breakdown of skin ([facility]) 06/06/2019  . Non-pressure chronic ulcer of other part of left foot limited to breakdown of skin ([place]) 06/06/2019  . Toe infection 04/04/2019  . Uncontrolled type 2 diabetes mellitus with hyperglycemia ([place] [place]) 05/02/2018  . Diabetic ulcer of left great toe ([place] [facility]) 07/30/2017  . Venous insufficiency of both lower  extremities 07/30/2017  . Obesity, Class III, BMI 40-49.9 (morbid obesity) (HCC) 07/30/2017  . Essential hypertension 09/03/2016   Past Medical History:  Diagnosis Date  . Diabetes mellitus without complication (HCC)   . Hypertension     Family History  Problem Relation Age of Onset  . Diabetes Mother   . Hyperlipidemia Father   . Hypertension Father     No past surgical history on file. Social History   Occupational History  . Occupation: Unemployed  Tobacco Use  . Smoking status: Never Smoker  . Smokeless tobacco: Never Used  Substance and Sexual Activity  . Alcohol use: No    Alcohol/week: 0.0 standard drinks  . Drug use: No  . Sexual activity: Not on file

## 2019-11-07 ENCOUNTER — Encounter: Payer: Self-pay | Admitting: Orthopedic Surgery

## 2019-11-07 ENCOUNTER — Ambulatory Visit (INDEPENDENT_AMBULATORY_CARE_PROVIDER_SITE_OTHER): Payer: Self-pay | Admitting: Orthopedic Surgery

## 2019-11-07 ENCOUNTER — Other Ambulatory Visit: Payer: Self-pay

## 2019-11-07 VITALS — Ht 73.0 in | Wt 349.0 lb

## 2019-11-07 DIAGNOSIS — E11621 Type 2 diabetes mellitus with foot ulcer: Secondary | ICD-10-CM

## 2019-11-07 DIAGNOSIS — E1165 Type 2 diabetes mellitus with hyperglycemia: Secondary | ICD-10-CM

## 2019-11-07 DIAGNOSIS — L97529 Non-pressure chronic ulcer of other part of left foot with unspecified severity: Secondary | ICD-10-CM

## 2019-11-07 DIAGNOSIS — L97511 Non-pressure chronic ulcer of other part of right foot limited to breakdown of skin: Secondary | ICD-10-CM

## 2019-11-07 DIAGNOSIS — M6702 Short Achilles tendon (acquired), left ankle: Secondary | ICD-10-CM

## 2019-11-07 NOTE — Progress Notes (Signed)
Office Visit Note   Patient: [name]           Date of Birth: December 01, 1980           MRN: [id number] Visit Date: 11/07/2019              Requested by: Horald Pollen, MD [place],  [place] [address] PCP: Horald Pollen, MD  Chief Complaint  Patient presents with  . Right Foot - Follow-up  . Left Foot - Follow-up      HPI: The patient is a 39 year old gentleman who presents today in follow-up for bilateral Wagner grade one ulcers.  He feels he has been doing well feels he has had improvement in his left great toe ulcer since the last debridement.  He has been doing antibacterial ointment dressing changes daily complains of some mild worsening of the swelling of his left right great toe he is in regular shoewear has been full weightbearing  Denies fevers or chills. Assessment & Plan: Visit Diagnoses: No diagnosis found.  Plan: The ulcers open with Iodosorb and gauze.  Continue to work on Achilles stretching.  Follow-up in 3 weeks or sooner if any changes  Follow-Up Instructions: No follow-ups on file.   Ortho Exam  Patient is alert, oriented, no adenopathy, well-dressed, normal affect, normal respiratory effort. The ulcer on the left great toe is 2 cm in diameter following debridement there is central ulceration 1 cm in diameter and about 3 mm deep.  There is bleeding tissue in the wound bed.  There is no sausage digit swelling no erythema or warmth of the great toe.  On the right foot great toe plantar ulcer is 3 cm in diameter following debridement of superficial callus and nonviable tissue there is central ulceration again 1 cm in diameter this is about 5 mm deep there is 25% necrotic tissue in the wound bed.  There is no odor no active drainage there is no erythema or sausage digit swelling of the great toe  Imaging: No results found. No images are attached to the encounter.  Labs: Lab Results  Component Value Date   HGBA1C 11.4 (A)  05/02/2018   HGBA1C 11.3 (A) 01/25/2018   HGBA1C 12.8 06/18/2017     Lab Results  Component Value Date   ALBUMIN 3.6 04/04/2019   ALBUMIN 4.0 07/30/2017   ALBUMIN 4.0 12/03/2016    No results found for: MG No results found for: VD25OH  No results found for: PREALBUMIN CBC EXTENDED Latest Ref Rng & Units 04/04/2019 05/02/2018 07/30/2017  WBC 4.0 - 10.5 K/uL 9.2 6.8 10.2  RBC 4.22 - 5.81 MIL/uL 5.26 5.12 5.23  HGB 13.0 - 17.0 g/dL 14.3 14.2(A) 14.7  HCT 39 - 52 % 43.8 42.2(A) 43.2  PLT 150 - 400 K/uL 387 - 279  NEUTROABS 1.7 - 7.7 K/uL 5.4 - 5.8  LYMPHSABS 0.7 - 4.0 K/uL 2.8 - 3.6     Body mass index is 46.04 kg/m.  Orders:  No orders of the defined types were placed in this encounter.  No orders of the defined types were placed in this encounter.    Procedures: No procedures performed  Clinical Data: No additional findings.  ROS:  All other systems negative, except as noted in the HPI. Review of Systems  Constitutional: Negative for chills and fever.  Skin: Positive for wound. Negative for color change.    Objective: Vital Signs: Ht 6\' 1"  (1.854 m)   Wt (!) 349  lb (158.3 kg)   BMI 46.04 kg/m   Specialty Comments:  No specialty comments available.  PMFS History: Patient Active Problem List   Diagnosis Date Noted  . Right foot ulcer, limited to breakdown of skin (HCC) 06/06/2019  . Non-pressure chronic ulcer of other part of left foot limited to breakdown of skin (HCC) 06/06/2019  . Toe infection 04/04/2019  . Uncontrolled type 2 diabetes mellitus with hyperglycemia (HCC) 05/02/2018  . 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 Venous insufficiency of both lower extremities 07/30/2017  . Obesity, Class III, BMI 40-49.9 (morbid obesity) (HCC) 07/30/2017  . Essential hypertension 09/03/2016   Past Medical History:  Diagnosis Date  . Diabetes mellitus without complication (HCC)   . Hypertension     Family History  Problem Relation Age of  Onset  . Diabetes Mother   . Hyperlipidemia Father   . Hypertension Father     No past surgical history on file. Social History   Occupational History  . Occupation: Unemployed  Tobacco Use  . Smoking status: Never Smoker  . Smokeless tobacco: Never Used  Vaping Use  . Vaping Use: Never used  Substance and Sexual Activity  . Alcohol use: No    Alcohol/week: 0.0 standard drinks  . Drug use: No  . Sexual activity: Not on file

## 2019-11-28 ENCOUNTER — Other Ambulatory Visit: Payer: Self-pay

## 2019-11-28 ENCOUNTER — Ambulatory Visit (INDEPENDENT_AMBULATORY_CARE_PROVIDER_SITE_OTHER): Payer: Self-pay | Admitting: Orthopedic Surgery

## 2019-11-28 ENCOUNTER — Encounter: Payer: Self-pay | Admitting: Orthopedic Surgery

## 2019-11-28 VITALS — Ht 73.0 in | Wt 349.0 lb

## 2019-11-28 DIAGNOSIS — L97529 Non-pressure chronic ulcer of other part of left foot with unspecified severity: Secondary | ICD-10-CM

## 2019-11-28 DIAGNOSIS — E11621 Type 2 diabetes mellitus with foot ulcer: Secondary | ICD-10-CM

## 2019-11-28 DIAGNOSIS — L97511 Non-pressure chronic ulcer of other part of right foot limited to breakdown of skin: Secondary | ICD-10-CM

## 2019-12-01 ENCOUNTER — Encounter: Payer: Self-pay | Admitting: Orthopedic Surgery

## 2019-12-01 NOTE — Progress Notes (Signed)
Office Visit Note   Patient: [name]           Date of Birth: 1980/10/15           MRN: [id number] Visit Date: 11/28/2019              Requested by: [name], MD [address] [name],  [place] [address] PCP: [name], MD  Chief Complaint  Patient presents with  . Left Foot - Follow-up  . Right Foot - Follow-up      HPI: Patient is a 39 year old gentleman who presents in follow-up for evaluation of both feet with ulceration bilaterally.  Patient is full weightbearing in regular shoes.  Patient states he has a nervous habit when he is sitting to bouncing on the ball of his foot.  Assessment & Plan: Visit Diagnoses:  1. 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 Right foot ulcer, limited to breakdown of skin (HCC)     Plan: Ulcers were debrided x2 patient was provided felt relieving donuts to unload pressure from the ulcers.  Follow-Up Instructions: Return in about 2 weeks (around 12/12/2019).   Ortho Exam  Patient is alert, oriented, no adenopathy, well-dressed, normal affect, normal respiratory effort. Examination patient has good pulses he has hallux rigidus bilaterally with dorsiflexion of the great toe only about 30 degrees.  He also has Achilles contracture with dorsiflexion to neutral bilaterally.  Patient has a Wagner grade 1 ulcer beneath the right great toe which is 1 cm in diameter.  After informed consent a 10 blade knife was used to breed the skin and soft tissue back to healthy viable granulation tissue after debridement the ulcer was 2 cm in diameter 5 mm deep.  The left toe ulcer was 5 mm in diameter prior to debridement and was 1 cm in diameter and 1 mm deep after debridement.  Imaging: No results found. No images are attached to the encounter.  Labs: Lab Results  Component Value Date   HGBA1C 11.4 (A) 05/02/2018   HGBA1C 11.3 (A) 01/25/2018   HGBA1C 12.8 06/18/2017     Lab Results  Component Value Date   ALBUMIN  3.6 04/04/2019   ALBUMIN 4.0 07/30/2017   ALBUMIN 4.0 12/03/2016    No results found for: MG No results found for: VD25OH  No results found for: PREALBUMIN CBC EXTENDED Latest Ref Rng & Units 04/04/2019 05/02/2018 07/30/2017  WBC 4.0 - 10.5 K/uL 9.2 6.8 10.2  RBC 4.22 - 5.81 MIL/uL 5.26 5.12 5.23  HGB 13.0 - 17.0 g/dL 50.5 14.2(A) 14.7  HCT 39 - 52 % 43.8 42.2(A) 43.2  PLT 150 - 400 K/uL 387 - 279  NEUTROABS 1.7 - 7.7 K/uL 5.4 - 5.8  LYMPHSABS 0.7 - 4.0 K/uL 2.8 - 3.6     Body mass index is 46.04 kg/m.  Orders:  No orders of the defined types were placed in this encounter.  No orders of the defined types were placed in this encounter.    Procedures: No procedures performed  Clinical Data: No additional findings.  ROS:  All other systems negative, except as noted in the HPI. Review of Systems  Objective: Vital Signs: Ht 6\' 1"  (1.854 m)   Wt (!) 349 lb (158.3 kg)   BMI 46.04 kg/m   Specialty Comments:  No specialty comments available.  PMFS History: Patient Active Problem List   Diagnosis Date Noted  . Right foot ulcer, limited to breakdown of skin (HCC) 06/06/2019  .  Non-pressure chronic ulcer of other part of left foot limited to breakdown of skin (HCC) 06/06/2019  . Toe infection 04/04/2019  . Uncontrolled type 2 diabetes mellitus with hyperglycemia (HCC) 05/02/2018  . 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 Venous insufficiency of both lower extremities 07/30/2017  . Obesity, Class III, BMI 40-49.9 (morbid obesity) (HCC) 07/30/2017  . Essential hypertension 09/03/2016   Past Medical History:  Diagnosis Date  . Diabetes mellitus without complication (HCC)   . Hypertension     Family History  Problem Relation Age of Onset  . Diabetes Mother   . Hyperlipidemia Father   . Hypertension Father     History reviewed. No pertinent surgical history. Social History   Occupational History  . Occupation: Unemployed  Tobacco Use  . Smoking  status: Never Smoker  . Smokeless tobacco: Never Used  Vaping Use  . Vaping Use: Never used  Substance and Sexual Activity  . Alcohol use: No    Alcohol/week: 0.0 standard drinks  . Drug use: No  . Sexual activity: Not on file

## 2019-12-12 ENCOUNTER — Encounter: Payer: Self-pay | Admitting: Orthopedic Surgery

## 2019-12-12 ENCOUNTER — Other Ambulatory Visit: Payer: Self-pay

## 2019-12-12 ENCOUNTER — Ambulatory Visit (INDEPENDENT_AMBULATORY_CARE_PROVIDER_SITE_OTHER): Payer: Self-pay | Admitting: Physician Assistant

## 2019-12-12 ENCOUNTER — Ambulatory Visit (INDEPENDENT_AMBULATORY_CARE_PROVIDER_SITE_OTHER): Payer: Self-pay

## 2019-12-12 VITALS — Ht 73.0 in | Wt 349.0 lb

## 2019-12-12 DIAGNOSIS — L97529 Non-pressure chronic ulcer of other part of left foot with unspecified severity: Secondary | ICD-10-CM

## 2019-12-12 DIAGNOSIS — E11621 Type 2 diabetes mellitus with foot ulcer: Secondary | ICD-10-CM

## 2019-12-12 MED ORDER — DOXYCYCLINE HYCLATE 100 MG PO TABS: 100.0000 mg | ORAL_TABLET | Freq: Two times a day (BID) | ORAL | 0 refills | Status: DC

## 2019-12-12 NOTE — Progress Notes (Signed)
Office Visit Note   Patient: [name]           Date of Birth: Nov 26, 1980           MRN: [id number] Visit Date: 12/12/2019              Requested by: [name], MD [address] [place],  [place] [address] PCP: [name], MD  Chief Complaint  Patient presents with  . Right Foot - Follow-up    Ulceration   . Left Foot - Follow-up      HPI: Patient is follows up on his bilateral great toe ulcerations.  He has donuts but admits he is not really been using them much.  He is concerned because over the weekend the ulcer on the left great toe began to drain.  He also noticed his toe got significantly larger and he had some redness in his leg which then disappeared  Assessment & Plan: Visit Diagnoses:  1. 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   Ortho Exam  Patient is alert, oriented, no adenopathy, well-dressed, normal affect, normal respiratory effort. Bilateral great toes.  Toe on right there is swelling of the great toe bounding dorsalis pedis pulse this ulcer is stable after verbal consent was obtained this was trimmed back to healthy viable tissue on the left side he has more erythema and some drainage.  This toe is also swollen but not much more swollen than the right.  After verbal consent was obtained this was also trimmed back to healthy surface.  Although it probes deeply does not probe down to bone no ascending cellulitis  Imaging: No results found. No images are attached to the encounter.  Labs: Lab Results  Component Value Date   HGBA1C 11.4 (A) 05/02/2018   HGBA1C 11.3 (A) 01/25/2018   HGBA1C 12.8 06/18/2017     Lab Results  Component Value Date   ALBUMIN 3.6 04/04/2019   ALBUMIN 4.0 07/30/2017   ALBUMIN 4.0 12/03/2016    No results found for: MG No results found for:  VD25OH  No results found for: PREALBUMIN CBC EXTENDED Latest Ref Rng & Units 04/04/2019 05/02/2018 07/30/2017  WBC 4.0 - 10.5 K/uL 9.2 6.8 10.2  RBC 4.22 - 5.81 MIL/uL 5.26 5.12 5.23  HGB 13.0 - 17.0 g/dL 62.3 14.2(A) 14.7  HCT 39 - 52 % 43.8 42.2(A) 43.2  PLT 150 - 400 K/uL 387 - 279  NEUTROABS 1.7 - 7.7 K/uL 5.4 - 5.8  LYMPHSABS 0.7 - 4.0 K/uL 2.8 - 3.6     Body mass index is 46.04 kg/m.  Orders:  Orders Placed This Encounter  Procedures  . XR Foot 2 Views Left   Meds ordered this encounter  Medications  . doxycycline (VIBRA-TABS) 100 MG tablet    Sig: Take 1 tablet (100 mg total) by mouth 2 (two) times daily.    Dispense:  60 tablet    Refill:  0     Procedures: No procedures performed  Clinical Data: No additional findings.  ROS:  All other systems negative, except as noted in the HPI. Review of Systems  Objective: Vital Signs: Ht 6\' 1"  (1.854 m)   Wt (!) 349 lb (158.3 kg)   BMI 46.04 kg/m   Specialty Comments:  No specialty comments available.  PMFS History: Patient  Active Problem List   Diagnosis Date Noted  . Right foot ulcer, limited to breakdown of skin (HCC) 06/06/2019  . Non-pressure chronic ulcer of other part of left foot limited to breakdown of skin (HCC) 06/06/2019  . Toe infection 04/04/2019  . Uncontrolled type 2 diabetes mellitus with hyperglycemia (HCC) 05/02/2018  . 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 Venous insufficiency of both lower extremities 07/30/2017  . Obesity, Class III, BMI 40-49.9 (morbid obesity) (HCC) 07/30/2017  . Essential hypertension 09/03/2016   Past Medical History:  Diagnosis Date  . Diabetes mellitus without complication (HCC)   . Hypertension     Family History  Problem Relation Age of Onset  . Diabetes Mother   . Hyperlipidemia Father   . Hypertension Father     No past surgical history on file. Social History   Occupational History  . Occupation: Unemployed  Tobacco Use  . Smoking  status: Never Smoker  . Smokeless tobacco: Never Used  Vaping Use  . Vaping Use: Never used  Substance and Sexual Activity  . Alcohol use: No    Alcohol/week: 0.0 standard drinks  . Drug use: No  . Sexual activity: Not on file

## 2019-12-26 ENCOUNTER — Encounter: Payer: Self-pay | Admitting: Physician Assistant

## 2019-12-26 ENCOUNTER — Ambulatory Visit (INDEPENDENT_AMBULATORY_CARE_PROVIDER_SITE_OTHER): Payer: Self-pay | Admitting: Physician Assistant

## 2019-12-26 VITALS — Ht 73.0 in | Wt 349.0 lb

## 2019-12-26 DIAGNOSIS — L97529 Non-pressure chronic ulcer of other part of left foot with unspecified severity: Secondary | ICD-10-CM

## 2019-12-26 DIAGNOSIS — E11621 Type 2 diabetes mellitus with foot ulcer: Secondary | ICD-10-CM

## 2019-12-26 NOTE — Progress Notes (Signed)
Office Visit Note   Patient: [name]           Date of Birth: [birth year]           MRN: [id number] Visit Date: 12/26/2019              Requested by: [name], MD [address] [place],  [place] [address] PCP: [name], MD  Chief Complaint  Patient presents with  . Right Foot - Follow-up  . Left Foot - Follow-up      HPI: This is a pleasant 39 year old gentleman who follows up today for his bilateral great toe ulcers.  On his last visit he had some significant swelling but since then has gotten much better.  He is asking if she could have another pair of donuts made for his other shoes.  Assessment & Plan: Visit Diagnoses: No diagnosis found.  Plan: Patient will continue to offload the area should follow-up in our office in 3 weeks or sooner if he has any concerns  Follow-Up Instructions: No follow-ups on file.   Ortho Exam  Patient is alert, oriented, no adenopathy, well-dressed, normal affect, normal respiratory effort. Bilateral great toes.  On the left side the ulcer measures about 1 x 1 cm it is almost at skin level with healthy vascular fibrinous tissue.  After verbal consent was obtained I did trim down some of the callus to healthy tissue.  There is no surrounding cellulitis no drainage no foul odor no concerns for infection  On the right side the ulcer measures about a centimeter in diameter by about a millimeter deep.  Surrounded by thickened callused.  After verbal permission was obtained this was trimmed down to healthy surface.  He did have some bleeding which was coagulated with a silver nitrate stick pressure was applied.  No surrounding cellulitis or foul odor not concern for an infective process at this time  Imaging: No results found. No images are attached to the encounter.  Labs: Lab Results  Component Value Date   HGBA1C 11.4 (A) 05/02/2018   HGBA1C 11.3 (A) 01/25/2018   HGBA1C 12.8 06/18/2017     Lab Results    Component Value Date   ALBUMIN 3.6 04/04/2019   ALBUMIN 4.0 07/30/2017   ALBUMIN 4.0 12/03/2016    No results found for: MG No results found for: VD25OH  No results found for: PREALBUMIN CBC EXTENDED Latest Ref Rng & Units 04/04/2019 05/02/2018 07/30/2017  WBC 4.0 - 10.5 K/uL 9.2 6.8 10.2  RBC 4.22 - 5.81 MIL/uL 5.26 5.12 5.23  HGB 13.0 - 17.0 g/dL 80.1 14.2(A) 14.7  HCT 39 - 52 % 43.8 42.2(A) 43.2  PLT 150 - 400 K/uL 387 - 279  NEUTROABS 1.7 - 7.7 K/uL 5.4 - 5.8  LYMPHSABS 0.7 - 4.0 K/uL 2.8 - 3.6     Body mass index is 46.04 kg/m.  Orders:  No orders of the defined types were placed in this encounter.  No orders of the defined types were placed in this encounter.    Procedures: No procedures performed  Clinical Data: No additional findings.  ROS:  All other systems negative, except as noted in the HPI. Review of Systems  Objective: Vital Signs: Ht 6\' 1"  (1.854 m)   Wt (!) 349 lb (158.3 kg)   BMI 46.04 kg/m   Specialty Comments:  No specialty comments available.  PMFS History: Patient Active Problem List   Diagnosis Date Noted  . Right foot ulcer, limited  to breakdown of skin (HCC) 06/06/2019  . Non-pressure chronic ulcer of other part of left foot limited to breakdown of skin (HCC) 06/06/2019  . Toe infection 04/04/2019  . Uncontrolled type 2 diabetes mellitus with hyperglycemia (HCC) 05/02/2018  . 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 Venous insufficiency of both lower extremities 07/30/2017  . Obesity, Class III, BMI 40-49.9 (morbid obesity) (HCC) 07/30/2017  . Essential hypertension 09/03/2016   Past Medical History:  Diagnosis Date  . Diabetes mellitus without complication (HCC)   . Hypertension     Family History  Problem Relation Age of Onset  . Diabetes Mother   . Hyperlipidemia Father   . Hypertension Father     No past surgical history on file. Social History   Occupational History  . Occupation: Unemployed   Tobacco Use  . Smoking status: Never Smoker  . Smokeless tobacco: Never Used  Vaping Use  . Vaping Use: Never used  Substance and Sexual Activity  . Alcohol use: No    Alcohol/week: 0.0 standard drinks  . Drug use: No  . Sexual activity: Not on file

## 2020-01-16 ENCOUNTER — Ambulatory Visit (INDEPENDENT_AMBULATORY_CARE_PROVIDER_SITE_OTHER): Payer: Self-pay | Admitting: Physician Assistant

## 2020-01-16 ENCOUNTER — Encounter: Payer: Self-pay | Admitting: Physician Assistant

## 2020-01-16 DIAGNOSIS — L97529 Non-pressure chronic ulcer of other part of left foot with unspecified severity: Secondary | ICD-10-CM

## 2020-01-16 DIAGNOSIS — E11621 Type 2 diabetes mellitus with foot ulcer: Secondary | ICD-10-CM

## 2020-01-16 NOTE — Progress Notes (Signed)
Office Visit Note   Patient: [name]           Date of Birth: 1981/03/27           MRN: [id number] Visit Date: 01/16/2020              Requested by: [name], MD [address] [place],  [place] [address] PCP: [name], MD  No chief complaint on file.     HPI: This is a pleasant gentleman who follows up on a regular basis for his bilateral great toe ulcers.  The one on the right is doing better and the one on the left is gotten significantly smaller.  He does daily dressing changes and cleanses the areas  Assessment & Plan: Visit Diagnoses: No diagnosis found.  Plan: Follow-up in 3 weeks.  Continue current treatment.  Follow-Up Instructions: No follow-ups on file.   Ortho Exam  Patient is alert, oriented, no adenopathy, well-dressed, normal affect, normal respiratory effort. Right great toe he has about a 2 cm ulcer beneath the great toe.  There is no foul odor no drainage some fibrinous tissue in the center but no surrounding cellulitis there is some skin maceration.  After verbal permission this was debrided back to bleeding surfaces and hemostasis was achieved with a silver nitrate stick  On the left side he just has a very small elliptical area of ulcer left.  The wrist is being filled in by healthy tissue.  No foul odor no drainage no surrounding cellulitis there is nothing really to debride on this toe today  Imaging: No results found. No images are attached to the encounter.  Labs: Lab Results  Component Value Date   HGBA1C 11.4 (A) 05/02/2018   HGBA1C 11.3 (A) 01/25/2018   HGBA1C 12.8 06/18/2017     Lab Results  Component Value Date   ALBUMIN 3.6 04/04/2019   ALBUMIN 4.0 07/30/2017   ALBUMIN 4.0 12/03/2016    No results found for: MG No results found for: VD25OH  No results found for: PREALBUMIN CBC EXTENDED Latest Ref Rng & Units 04/04/2019 05/02/2018 07/30/2017  WBC 4.0 - 10.5 K/uL 9.2 6.8 10.2  RBC 4.22 - 5.81 MIL/uL  5.26 5.12 5.23  HGB 13.0 - 17.0 g/dL 51.0 14.2(A) 14.7  HCT 39 - 52 % 43.8 42.2(A) 43.2  PLT 150 - 400 K/uL 387 - 279  NEUTROABS 1.7 - 7.7 K/uL 5.4 - 5.8  LYMPHSABS 0.7 - 4.0 K/uL 2.8 - 3.6     There is no height or weight on file to calculate BMI.  Orders:  No orders of the defined types were placed in this encounter.  No orders of the defined types were placed in this encounter.    Procedures: No procedures performed  Clinical Data: No additional findings.  ROS:  All other systems negative, except as noted in the HPI. Review of Systems  Objective: Vital Signs: There were no vitals taken for this visit.  Specialty Comments:  No specialty comments available.  PMFS History: Patient Active Problem List   Diagnosis Date Noted  . Right foot ulcer, limited to breakdown of skin (HCC) 06/06/2019  . Non-pressure chronic ulcer of other part of left foot limited to breakdown of skin (HCC) 06/06/2019  . Toe infection 04/04/2019  . Uncontrolled type 2 diabetes mellitus with hyperglycemia (HCC) 05/02/2018  . 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 Venous insufficiency of both lower extremities 07/30/2017  . Obesity, Class III,  BMI 40-49.9 (morbid obesity) (HCC) 07/30/2017  . Essential hypertension 09/03/2016   Past Medical History:  Diagnosis Date  . Diabetes mellitus without complication (HCC)   . Hypertension     Family History  Problem Relation Age of Onset  . Diabetes Mother   . Hyperlipidemia Father   . Hypertension Father     No past surgical history on file. Social History   Occupational History  . Occupation: Unemployed  Tobacco Use  . Smoking status: Never Smoker  . Smokeless tobacco: Never Used  Vaping Use  . Vaping Use: Never used  Substance and Sexual Activity  . Alcohol use: No    Alcohol/week: 0.0 standard drinks  . Drug use: No  . Sexual activity: Not on file

## 2020-02-06 ENCOUNTER — Ambulatory Visit (INDEPENDENT_AMBULATORY_CARE_PROVIDER_SITE_OTHER): Payer: Self-pay | Admitting: Physician Assistant

## 2020-02-06 ENCOUNTER — Encounter: Payer: Self-pay | Admitting: Physician Assistant

## 2020-02-06 VITALS — Ht 73.0 in | Wt 349.0 lb

## 2020-02-06 DIAGNOSIS — L97529 Non-pressure chronic ulcer of other part of left foot with unspecified severity: Secondary | ICD-10-CM

## 2020-02-06 DIAGNOSIS — E11621 Type 2 diabetes mellitus with foot ulcer: Secondary | ICD-10-CM

## 2020-02-06 NOTE — Progress Notes (Signed)
Office Visit Note   Patient: [name]           Date of Birth: 1981-01-28           MRN: [id number] Visit Date: 02/06/2020              Requested by: [name], MD [address] [place],  [place] [address] PCP: [name], MD  Chief Complaint  Patient presents with  . Right Foot - Follow-up  . Left Foot - Follow-up      HPI: She presents today for follow-up on his bilateral great toe ulcers.  He periodically has these trimmed.  He also uses a doughnut.  Assessment & Plan: Visit Diagnoses: No diagnosis found.  Plan: Follow-up in 3 weeks  Follow-Up Instructions: No follow-ups on file.   Ortho Exam  Patient is alert, oriented, no adenopathy, well-dressed, normal affect, normal respiratory effort. On the right foot he has a ulcer which measures approximately 3 cm in diameter it does not probe to bone.  There is healthy tissue at the base.  After obtaining verbal consent the macerated skin was debrided to healthy bleeding tissue.  Hemostasis was achieved and then a Band-Aid with Iodosorb was applied.  The toe has no foul odor no drainage no surrounding cellulitis after trimming the callus area it was approximately 3-1/2 cm in diameter  Left great toe significant maceration and callus.  Also measures about 2 cm in diameter with surrounding callus and maceration.  No foul odor no cellulitis.  After verbal consent was obtained it was debrided to 3 cm in diameter.  Healthy bleeding tissue was achieved with hemostasis with silver nitrate stick.  Imaging: No results found. No images are attached to the encounter.  Labs: Lab Results  Component Value Date   HGBA1C 11.4 (A) 05/02/2018   HGBA1C 11.3 (A) 01/25/2018   HGBA1C 12.8 06/18/2017     Lab Results  Component Value Date   ALBUMIN 3.6 04/04/2019   ALBUMIN 4.0 07/30/2017   ALBUMIN 4.0 12/03/2016    No results found for: MG No results found for: VD25OH  No results found for:  PREALBUMIN CBC EXTENDED Latest Ref Rng & Units 04/04/2019 05/02/2018 07/30/2017  WBC 4.0 - 10.5 K/uL 9.2 6.8 10.2  RBC 4.22 - 5.81 MIL/uL 5.26 5.12 5.23  HGB 13.0 - 17.0 g/dL 22.4 14.2(A) 14.7  HCT 39 - 52 % 43.8 42.2(A) 43.2  PLT 150 - 400 K/uL 387 - 279  NEUTROABS 1.7 - 7.7 K/uL 5.4 - 5.8  LYMPHSABS 0.7 - 4.0 K/uL 2.8 - 3.6     Body mass index is 46.04 kg/m.  Orders:  No orders of the defined types were placed in this encounter.  No orders of the defined types were placed in this encounter.    Procedures: No procedures performed  Clinical Data: No additional findings.  ROS:  All other systems negative, except as noted in the HPI. Review of Systems  Objective: Vital Signs: Ht 6\' 1"  (1.854 m)   Wt (!) 349 lb (158.3 kg)   BMI 46.04 kg/m   Specialty Comments:  No specialty comments available.  PMFS History: Patient Active Problem List   Diagnosis Date Noted  . Right foot ulcer, limited to breakdown of skin (HCC) 06/06/2019  . Non-pressure chronic ulcer of other part of left foot limited to breakdown of skin (HCC) 06/06/2019  . Toe infection 04/04/2019  . Uncontrolled type 2 diabetes mellitus with hyperglycemia (HCC) 05/02/2018  .  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 Venous insufficiency of both lower extremities 07/30/2017  . Obesity, Class III, BMI 40-49.9 (morbid obesity) (HCC) 07/30/2017  . Essential hypertension 09/03/2016   Past Medical History:  Diagnosis Date  . Diabetes mellitus without complication (HCC)   . Hypertension     Family History  Problem Relation Age of Onset  . Diabetes Mother   . Hyperlipidemia Father   . Hypertension Father     No past surgical history on file. Social History   Occupational History  . Occupation: Unemployed  Tobacco Use  . Smoking status: Never Smoker  . Smokeless tobacco: Never Used  Vaping Use  . Vaping Use: Never used  Substance and Sexual Activity  . Alcohol use: No    Alcohol/week: 0.0  standard drinks  . Drug use: No  . Sexual activity: Not on file

## 2020-02-27 ENCOUNTER — Ambulatory Visit: Payer: Self-pay

## 2020-02-27 ENCOUNTER — Ambulatory Visit (INDEPENDENT_AMBULATORY_CARE_PROVIDER_SITE_OTHER): Payer: Self-pay | Admitting: Orthopedic Surgery

## 2020-02-27 ENCOUNTER — Encounter: Payer: Self-pay | Admitting: Orthopedic Surgery

## 2020-02-27 DIAGNOSIS — I872 Venous insufficiency (chronic) (peripheral): Secondary | ICD-10-CM

## 2020-02-27 DIAGNOSIS — M6702 Short Achilles tendon (acquired), left ankle: Secondary | ICD-10-CM

## 2020-02-27 DIAGNOSIS — L97529 Non-pressure chronic ulcer of other part of left foot with unspecified severity: Secondary | ICD-10-CM

## 2020-02-27 DIAGNOSIS — E1165 Type 2 diabetes mellitus with hyperglycemia: Secondary | ICD-10-CM

## 2020-02-27 DIAGNOSIS — L97511 Non-pressure chronic ulcer of other part of right foot limited to breakdown of skin: Secondary | ICD-10-CM

## 2020-02-27 DIAGNOSIS — E11621 Type 2 diabetes mellitus with foot ulcer: Secondary | ICD-10-CM

## 2020-02-27 DIAGNOSIS — M86072 Acute hematogenous osteomyelitis, left ankle and foot: Secondary | ICD-10-CM

## 2020-02-27 MED ORDER — DOXYCYCLINE HYCLATE 100 MG PO TABS: 100.0000 mg | ORAL_TABLET | Freq: Two times a day (BID) | ORAL | 0 refills | Status: DC

## 2020-02-27 NOTE — Progress Notes (Signed)
Office Visit Note   Patient: [name]           Date of Birth: 02/01/1981           MRN: [id number] Visit Date: 02/27/2020              Requested by: [name], MD [address] [place],  [place] [address] PCP: [name], MD  No chief complaint on file.     HPI: Patient presents with acute swelling and redness of bilateral great toes.  Patient states this started after he had an acute episode of cellulitis of the left leg.  He states the cellulitis has resolved in the left leg but he has the acute swelling and redness of both great toes.  Patient has had Achilles contracture with hallux rigidus with overloading both great toes with Wagner grade 1 ulcers.  Patient has excellent dorsalis pedis pulses.  Assessment & Plan: Visit Diagnoses: No diagnosis found.  Plan: We will order an MRI scan to further evaluate the great toes bilaterally order is provided for doxycycline.  We will place him in a Darco shoes bilaterally to unload the great toes bilaterally.  Follow-Up Instructions: No follow-ups on file.   Ortho Exam  Patient is alert, oriented, no adenopathy, well-dressed, normal affect, normal respiratory effort. Examination patient has an excellent dorsalis pedis pulse is hemoglobin A1c has consistently been above 11.  Patient does have hallux rigidus with decreased dorsiflexion of the great toes bilaterally there is a Wagner grade 1 ulcer beneath the great toes bilaterally that is 10 mm in diameter 5 mm deep there is no exposed bone or tendon there is granulation tissue at the base of the wound.  There is no drainage patient does have swelling and redness of both great toes.  Imaging: No results found. No images are attached to the encounter.  Labs: Lab Results  Component Value Date   HGBA1C 11.4 (A) 05/02/2018   HGBA1C 11.3 (A) 01/25/2018   HGBA1C 12.8 06/18/2017     Lab Results  Component Value Date   ALBUMIN 3.6 04/04/2019   ALBUMIN 4.0  07/30/2017   ALBUMIN 4.0 12/03/2016    No results found for: MG No results found for: VD25OH  No results found for: PREALBUMIN CBC EXTENDED Latest Ref Rng & Units 04/04/2019 05/02/2018 07/30/2017  WBC 4.0 - 10.5 K/uL 9.2 6.8 10.2  RBC 4.22 - 5.81 MIL/uL 5.26 5.12 5.23  HGB 13.0 - 17.0 g/dL 09.3 14.2(A) 14.7  HCT 39 - 52 % 43.8 42.2(A) 43.2  PLT 150 - 400 K/uL 387 - 279  NEUTROABS 1.7 - 7.7 K/uL 5.4 - 5.8  LYMPHSABS 0.7 - 4.0 K/uL 2.8 - 3.6     There is no height or weight on file to calculate BMI.  Orders:  No orders of the defined types were placed in this encounter.  No orders of the defined types were placed in this encounter.    Procedures: No procedures performed  Clinical Data: No additional findings.  ROS:  All other systems negative, except as noted in the HPI. Review of Systems  Objective: Vital Signs: There were no vitals taken for this visit.  Specialty Comments:  No specialty comments available.  PMFS History: Patient Active Problem List   Diagnosis Date Noted  . Right foot ulcer, limited to breakdown of skin (HCC) 06/06/2019  . Non-pressure chronic ulcer of other part of left foot limited to breakdown of skin (HCC) 06/06/2019  . Toe  infection 04/04/2019  . Uncontrolled type 2 diabetes mellitus with hyperglycemia (HCC) 05/02/2018  . 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 Venous insufficiency of both lower extremities 07/30/2017  . Obesity, Class III, BMI 40-49.9 (morbid obesity) (HCC) 07/30/2017  . Essential hypertension 09/03/2016   Past Medical History:  Diagnosis Date  . Diabetes mellitus without complication (HCC)   . Hypertension     Family History  Problem Relation Age of Onset  . Diabetes Mother   . Hyperlipidemia Father   . Hypertension Father     No past surgical history on file. Social History   Occupational History  . Occupation: Unemployed  Tobacco Use  . Smoking status: Never Smoker  . Smokeless tobacco:  Never Used  Vaping Use  . Vaping Use: Never used  Substance and Sexual Activity  . Alcohol use: No    Alcohol/week: 0.0 standard drinks  . Drug use: No  . Sexual activity: Not on file

## 2020-03-02 ENCOUNTER — Inpatient Hospital Stay (HOSPITAL_COMMUNITY)
Admission: EM | Admit: 2020-03-02 | Discharge: 2020-03-08 | DRG: 853 | Disposition: A | Discharge status: Home / Self Care | Payer: Self-pay | Attending: Internal Medicine | Admitting: Internal Medicine

## 2020-03-02 ENCOUNTER — Emergency Department (HOSPITAL_COMMUNITY): Payer: Self-pay

## 2020-03-02 ENCOUNTER — Other Ambulatory Visit: Payer: Self-pay

## 2020-03-02 DIAGNOSIS — Z833 Family history of diabetes mellitus: Secondary | ICD-10-CM

## 2020-03-02 DIAGNOSIS — E785 Hyperlipidemia, unspecified: Secondary | ICD-10-CM | POA: Diagnosis present

## 2020-03-02 DIAGNOSIS — E1165 Type 2 diabetes mellitus with hyperglycemia: Secondary | ICD-10-CM

## 2020-03-02 DIAGNOSIS — F4010 Social phobia, unspecified: Secondary | ICD-10-CM | POA: Diagnosis present

## 2020-03-02 DIAGNOSIS — R45851 Suicidal ideations: Secondary | ICD-10-CM | POA: Diagnosis present

## 2020-03-02 DIAGNOSIS — Z9119 Patient's noncompliance with other medical treatment and regimen: Secondary | ICD-10-CM

## 2020-03-02 DIAGNOSIS — R7881 Bacteremia: Secondary | ICD-10-CM

## 2020-03-02 DIAGNOSIS — B488 Other specified mycoses: Secondary | ICD-10-CM | POA: Diagnosis present

## 2020-03-02 DIAGNOSIS — L03032 Cellulitis of left toe: Secondary | ICD-10-CM | POA: Diagnosis present

## 2020-03-02 DIAGNOSIS — Z8249 Family history of ischemic heart disease and other diseases of the circulatory system: Secondary | ICD-10-CM

## 2020-03-02 DIAGNOSIS — F331 Major depressive disorder, recurrent, moderate: Secondary | ICD-10-CM | POA: Diagnosis present

## 2020-03-02 DIAGNOSIS — Z79899 Other long term (current) drug therapy: Secondary | ICD-10-CM

## 2020-03-02 DIAGNOSIS — L97511 Non-pressure chronic ulcer of other part of right foot limited to breakdown of skin: Secondary | ICD-10-CM | POA: Diagnosis present

## 2020-03-02 DIAGNOSIS — Z597 Insufficient social insurance and welfare support: Secondary | ICD-10-CM

## 2020-03-02 DIAGNOSIS — R6520 Severe sepsis without septic shock: Secondary | ICD-10-CM | POA: Diagnosis present

## 2020-03-02 DIAGNOSIS — I872 Venous insufficiency (chronic) (peripheral): Secondary | ICD-10-CM | POA: Diagnosis present

## 2020-03-02 DIAGNOSIS — E869 Volume depletion, unspecified: Secondary | ICD-10-CM | POA: Diagnosis present

## 2020-03-02 DIAGNOSIS — R17 Unspecified jaundice: Secondary | ICD-10-CM | POA: Diagnosis present

## 2020-03-02 DIAGNOSIS — Z6841 Body Mass Index (BMI) 40.0 and over, adult: Secondary | ICD-10-CM

## 2020-03-02 DIAGNOSIS — E1169 Type 2 diabetes mellitus with other specified complication: Secondary | ICD-10-CM | POA: Diagnosis present

## 2020-03-02 DIAGNOSIS — E11628 Type 2 diabetes mellitus with other skin complications: Secondary | ICD-10-CM | POA: Diagnosis present

## 2020-03-02 DIAGNOSIS — M869 Osteomyelitis, unspecified: Secondary | ICD-10-CM

## 2020-03-02 DIAGNOSIS — E1110 Type 2 diabetes mellitus with ketoacidosis without coma: Secondary | ICD-10-CM | POA: Diagnosis present

## 2020-03-02 DIAGNOSIS — Z83438 Family history of other disorder of lipoprotein metabolism and other lipidemia: Secondary | ICD-10-CM

## 2020-03-02 DIAGNOSIS — Z9114 Patient's other noncompliance with medication regimen: Secondary | ICD-10-CM

## 2020-03-02 DIAGNOSIS — E11621 Type 2 diabetes mellitus with foot ulcer: Secondary | ICD-10-CM | POA: Diagnosis present

## 2020-03-02 DIAGNOSIS — Z20822 Contact with and (suspected) exposure to covid-19: Secondary | ICD-10-CM | POA: Diagnosis present

## 2020-03-02 DIAGNOSIS — Z7984 Long term (current) use of oral hypoglycemic drugs: Secondary | ICD-10-CM

## 2020-03-02 DIAGNOSIS — A419 Sepsis, unspecified organism: Secondary | ICD-10-CM

## 2020-03-02 DIAGNOSIS — N179 Acute kidney failure, unspecified: Secondary | ICD-10-CM | POA: Diagnosis present

## 2020-03-02 DIAGNOSIS — F4000 Agoraphobia, unspecified: Secondary | ICD-10-CM | POA: Diagnosis present

## 2020-03-02 DIAGNOSIS — I10 Essential (primary) hypertension: Secondary | ICD-10-CM | POA: Diagnosis present

## 2020-03-02 DIAGNOSIS — A401 Sepsis due to streptococcus, group B: Principal | ICD-10-CM

## 2020-03-02 DIAGNOSIS — L97526 Non-pressure chronic ulcer of other part of left foot with bone involvement without evidence of necrosis: Secondary | ICD-10-CM | POA: Diagnosis present

## 2020-03-02 DIAGNOSIS — L02612 Cutaneous abscess of left foot: Secondary | ICD-10-CM

## 2020-03-02 LAB — RESPIRATORY PANEL BY RT PCR (FLU A&B, COVID)
Influenza A by PCR: NEGATIVE
Influenza B by PCR: NEGATIVE
SARS Coronavirus 2 by RT PCR: NEGATIVE

## 2020-03-02 LAB — CBC WITH DIFFERENTIAL/PLATELET
Abs Immature Granulocytes: 0.14 10*3/uL — ABNORMAL HIGH (ref 0.00–0.07)
Basophils Absolute: 0.1 10*3/uL (ref 0.0–0.1)
Basophils Relative: 0 %
Eosinophils Absolute: 0 10*3/uL (ref 0.0–0.5)
Eosinophils Relative: 0 %
HCT: 45.5 % (ref 39.0–52.0)
Hemoglobin: 14.4 g/dL (ref 13.0–17.0)
Immature Granulocytes: 1 %
Lymphocytes Relative: 6 %
Lymphs Abs: 1.1 10*3/uL (ref 0.7–4.0)
MCH: 26.7 pg (ref 26.0–34.0)
MCHC: 31.6 g/dL (ref 30.0–36.0)
MCV: 84.3 fL (ref 80.0–100.0)
Monocytes Absolute: 0.9 10*3/uL (ref 0.1–1.0)
Monocytes Relative: 5 %
Neutro Abs: 16.3 10*3/uL — ABNORMAL HIGH (ref 1.7–7.7)
Neutrophils Relative %: 88 %
Platelets: 304 10*3/uL (ref 150–400)
RBC: 5.4 MIL/uL (ref 4.22–5.81)
RDW: 13.8 % (ref 11.5–15.5)
WBC: 18.5 10*3/uL — ABNORMAL HIGH (ref 4.0–10.5)
nRBC: 0 % (ref 0.0–0.2)

## 2020-03-02 LAB — I-STAT VENOUS BLOOD GAS, ED
Acid-base deficit: 7 mmol/L — ABNORMAL HIGH (ref 0.0–2.0)
Bicarbonate: 16.9 mmol/L — ABNORMAL LOW (ref 20.0–28.0)
Calcium, Ion: 1.1 mmol/L — ABNORMAL LOW (ref 1.15–1.40)
HCT: 43 % (ref 39.0–52.0)
Hemoglobin: 14.6 g/dL (ref 13.0–17.0)
O2 Saturation: 40 %
Potassium: 4.4 mmol/L (ref 3.5–5.1)
Sodium: 132 mmol/L — ABNORMAL LOW (ref 135–145)
TCO2: 18 mmol/L — ABNORMAL LOW (ref 22–32)
pCO2, Ven: 28.6 mmHg — ABNORMAL LOW (ref 44.0–60.0)
pH, Ven: 7.379 (ref 7.250–7.430)
pO2, Ven: 23 mmHg — CL (ref 32.0–45.0)

## 2020-03-02 LAB — COMPREHENSIVE METABOLIC PANEL
ALT: 15 U/L (ref 0–44)
AST: 14 U/L — ABNORMAL LOW (ref 15–41)
Albumin: 3.1 g/dL — ABNORMAL LOW (ref 3.5–5.0)
Alkaline Phosphatase: 84 U/L (ref 38–126)
Anion gap: 18 — ABNORMAL HIGH (ref 5–15)
BUN: 17 mg/dL (ref 6–20)
CO2: 17 mmol/L — ABNORMAL LOW (ref 22–32)
Calcium: 9 mg/dL (ref 8.9–10.3)
Chloride: 95 mmol/L — ABNORMAL LOW (ref 98–111)
Creatinine, Ser: 1.3 mg/dL — ABNORMAL HIGH (ref 0.61–1.24)
GFR, Estimated: >60 mL/min (ref >60)
Glucose, Bld: 369 mg/dL — ABNORMAL HIGH (ref 70–99)
Potassium: 4.2 mmol/L (ref 3.5–5.1)
Sodium: 130 mmol/L — ABNORMAL LOW (ref 135–145)
Total Bilirubin: 1.4 mg/dL — ABNORMAL HIGH (ref 0.3–1.2)
Total Protein: 8.3 g/dL — ABNORMAL HIGH (ref 6.5–8.1)

## 2020-03-02 LAB — URINALYSIS, ROUTINE W REFLEX MICROSCOPIC
Bilirubin Urine: NEGATIVE
Glucose, UA: >=500 mg/dL — AB
Ketones, ur: 80 mg/dL — AB
Nitrite: NEGATIVE
Protein, ur: 30 mg/dL — AB
Specific Gravity, Urine: 1.025 (ref 1.005–1.030)
pH: 5 (ref 5.0–8.0)

## 2020-03-02 LAB — LACTIC ACID, PLASMA
Lactic Acid, Venous: 2.3 mmol/L (ref 0.5–1.9)
Lactic Acid, Venous: 2.3 mmol/L (ref 0.5–1.9)

## 2020-03-02 LAB — CBG MONITORING, ED: Glucose-Capillary: 336 mg/dL — ABNORMAL HIGH (ref 70–99)

## 2020-03-02 LAB — PROTIME-INR
INR: 1.3 — ABNORMAL HIGH (ref 0.8–1.2)
Prothrombin Time: 15.7 seconds — ABNORMAL HIGH (ref 11.4–15.2)

## 2020-03-02 IMAGING — MR MR FOOT*L* W/O CM
4 of 7 series · 18 of 40 positions shown · non-contrast
Comparison: Office radiographs [DATE] and [DATE].

CLINICAL DATA: Bilateral foot swelling. Prescribed antibiotics. No
history of diabetes. Osteomyelitis suspected.

EXAM:
MRI OF THE LEFT FOOT WITHOUT CONTRAST
TECHNIQUE: Multiplanar, multisequence MR imaging of the left forefoot was
performed. No intravenous contrast was administered.

[Series 5: T2 fat-sat · coronal · 3.0mm · 0.27mm/px · 7 of 36 slices shown (1 of 2)]
[im 1/36]
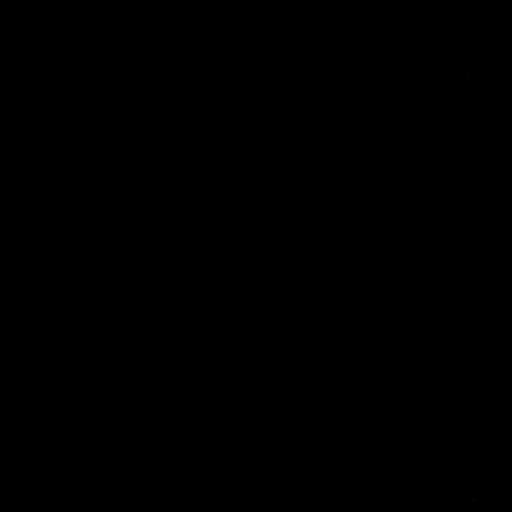
[im 6/36]
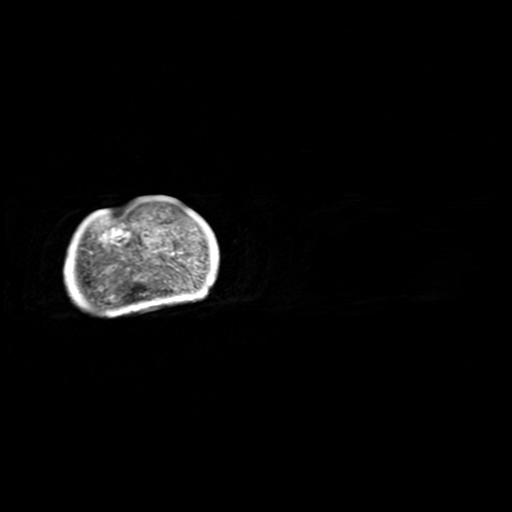
[im 12/36]
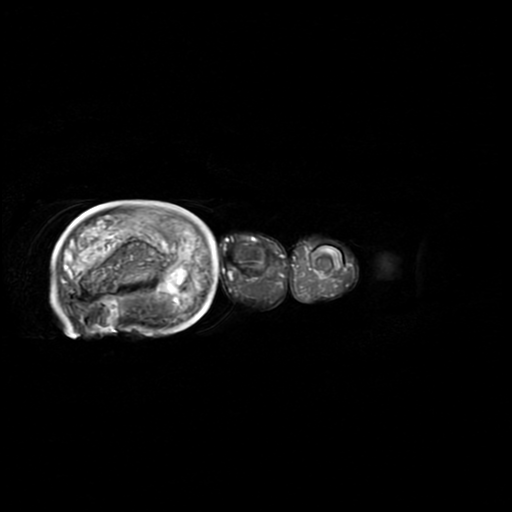
[im 18/36]
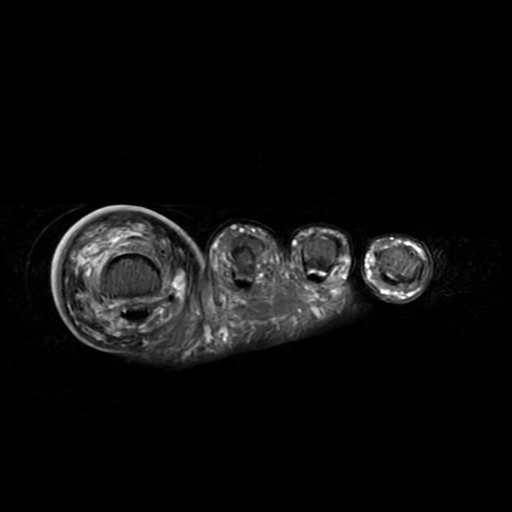
[im 24/36]
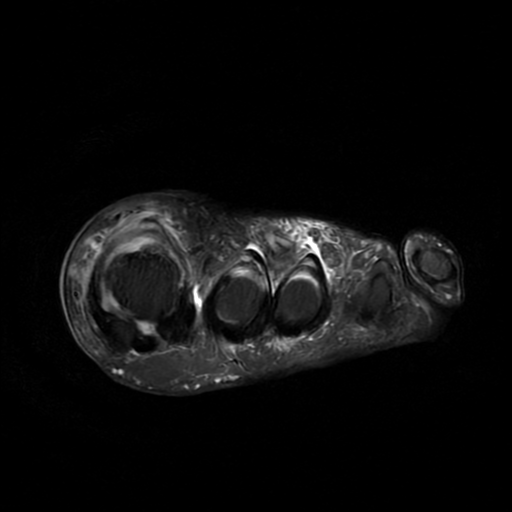
[im 30/36]
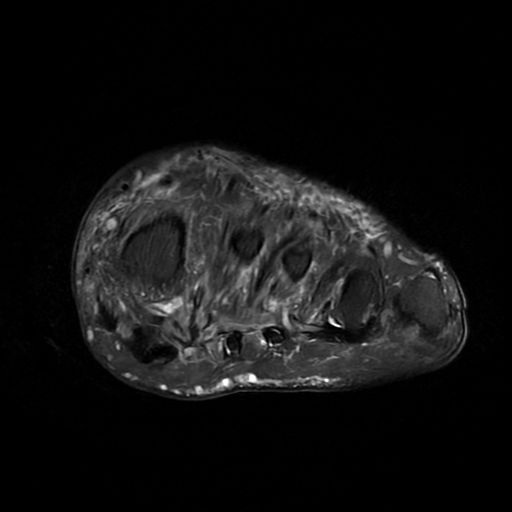
[im 36/36]
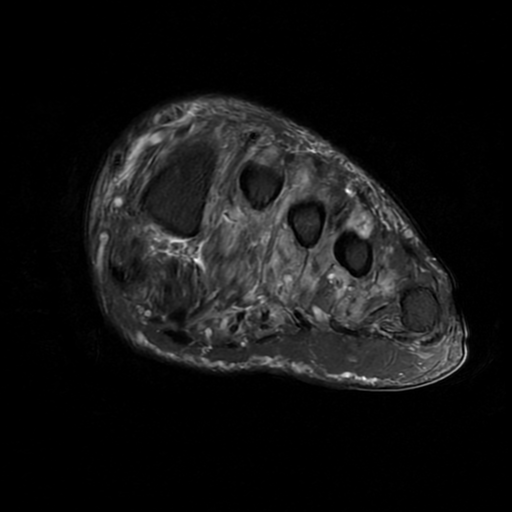

[Series 6: T1 · coronal · 3.0mm · 0.27mm/px · 3 of 36 slices shown (1 of 2)]
[im 8/36]
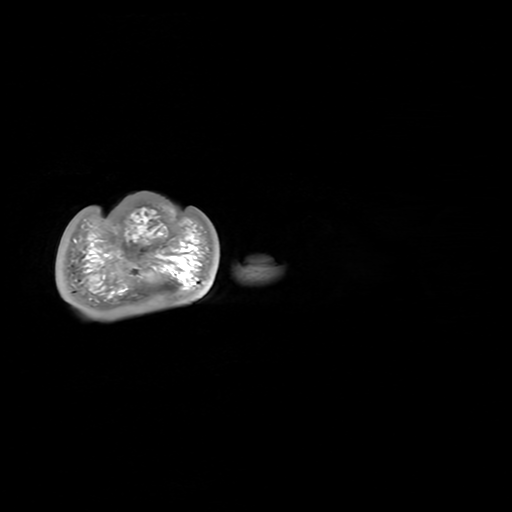
[im 22/36]
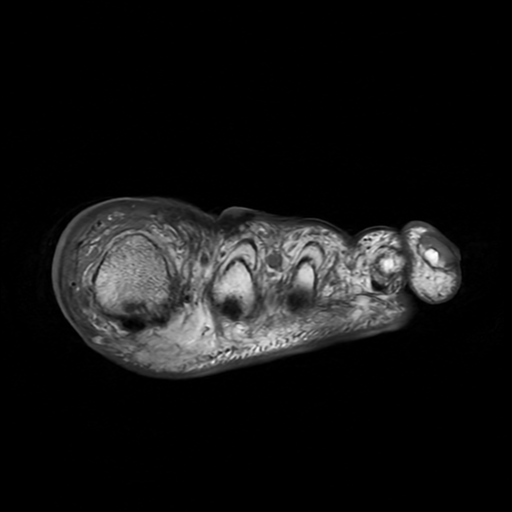
[im 36/36]
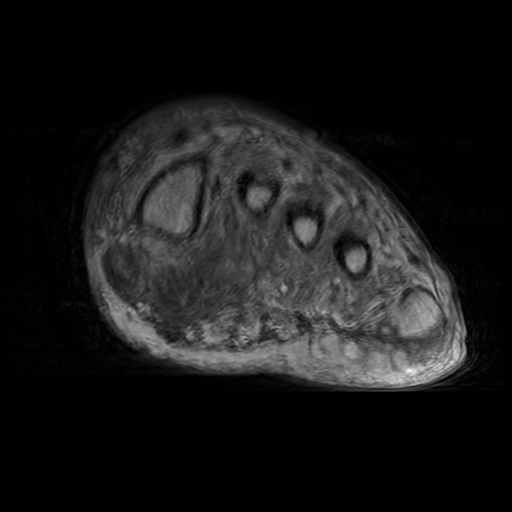

[Series 7: T1 · axial · 3.0mm · 0.31mm/px · z∈[-25,+58]mm · 3 of 29 slices shown (2 of 2)]
[im 1/29]
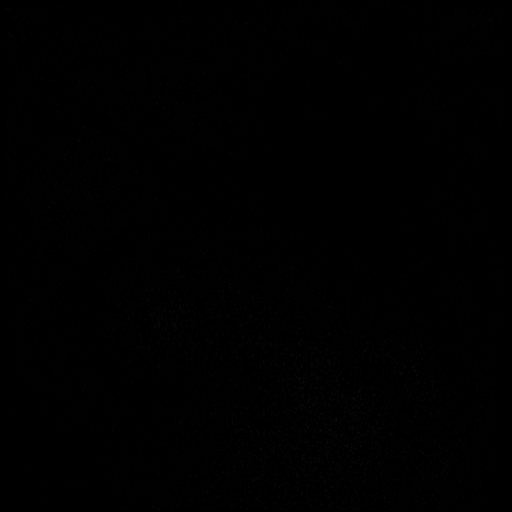
[im 15/29]
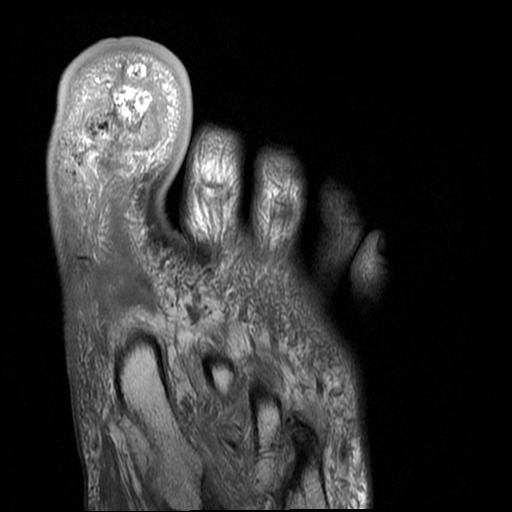
[im 29/29]
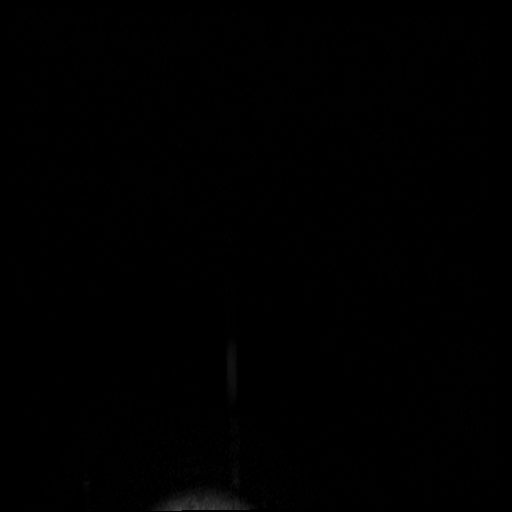

[Series 8: T2 fat-sat · axial · 3.0mm · 0.31mm/px · z∈[-25,+58]mm · 5 of 29 slices shown (2 of 2)]
[im 1/29]
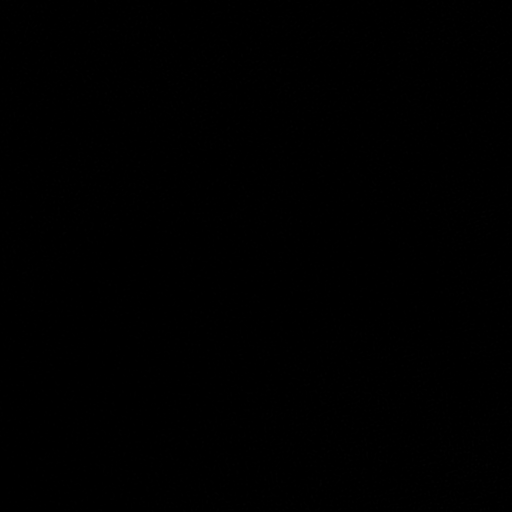
[im 8/29]
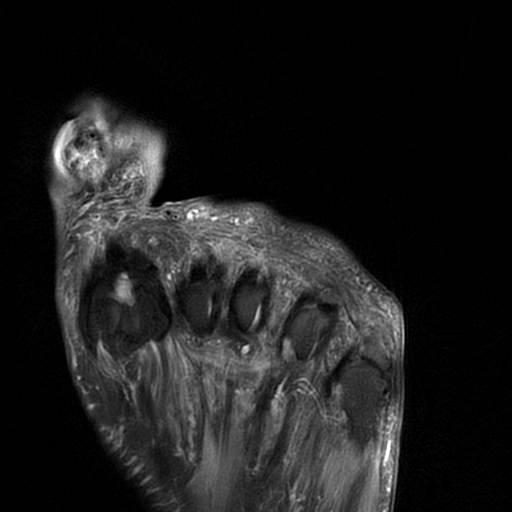
[im 15/29]
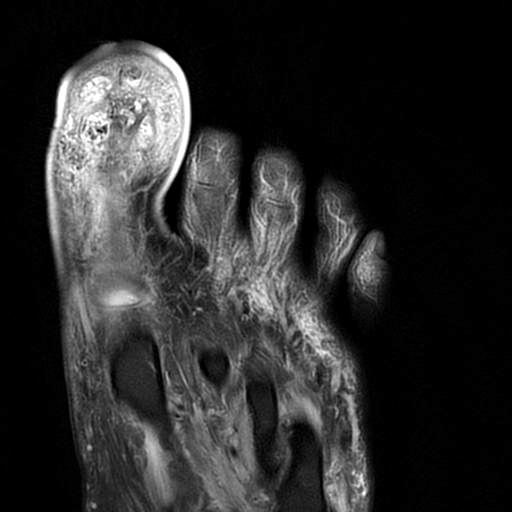
[im 22/29]
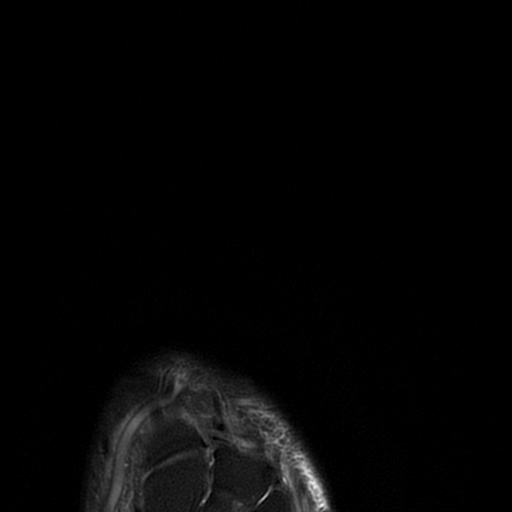
[im 29/29]
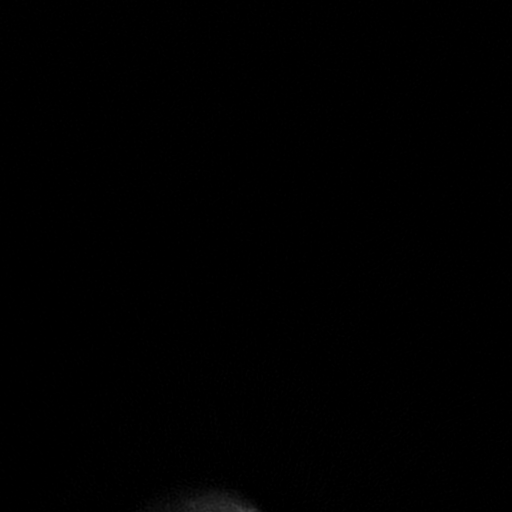

[18 of 40 positions shown; findings below may reference images not displayed]

FINDINGS: Bones/Joint/Cartilage

There is mild marrow T2 hyperintensity within the distal 1st
phalanx. No cortical destruction or T1 signal abnormality is
identified. There is no abnormal marrow signal within the proximal
1st phalanx, additional digits or visualized metatarsals. There are
underlying mild degenerative changes at the 1st metatarsophalangeal
and interphalangeal joints with associated small joint effusions.
Incidental chronic pseudoarthrosis between the bases of the 3rd and
4th metatarsals is unchanged from prior radiographs, without
surrounding soft tissue abnormality.

Ligaments

Intact Lisfranc ligament. The collateral ligaments of the
metatarsophalangeal joints are intact.

Muscles and Tendons

The forefoot tendons are intact without significant tenosynovitis.
There is mild nonspecific T2 hyperintensity within the forefoot
musculature. No focal fluid collection.

Soft tissues

Soft tissue swelling and edema throughout the great toe with focal
soft tissue ulceration plantar to the interphalangeal joint.
Probable small associated sinus tract extending to the periosteal
surface of the distal 1st phalanx, best seen on image [DATE]. No focal
fluid collections are identified. In correlation with recent
radiographs, there is probable associated soft tissue emphysema in
the great toe. No evidence of foreign body.
IMPRESSION: 1. Soft tissue swelling and edema throughout the great toe with
focal soft tissue ulceration plantar to the interphalangeal joint.
Probable small associated sinus tract extending to the periosteal
surface of the distal 1st phalanx. No focal fluid collection
identified.
2. Mild marrow T2 hyperintensity within the distal 1st phalanx
without cortical destruction or T1 signal abnormality. This finding
is nonspecific and could be secondary to hyperemia, although
suspicious for early osteomyelitis given proximity to the skin
ulceration and suspected sinus tract described above.
3. Mild degenerative changes at the 1st metatarsophalangeal and
interphalangeal joints.
4. Preliminary report of these findings was discussed with Dr. NASIRUDEEN

## 2020-03-02 IMAGING — DX DG CHEST 2V
2 series · 2 of 2 positions shown · non-contrast
Comparison: None.

CLINICAL DATA: Suspected sepsis

EXAM:
CHEST - 2 VIEW

[chest lat]
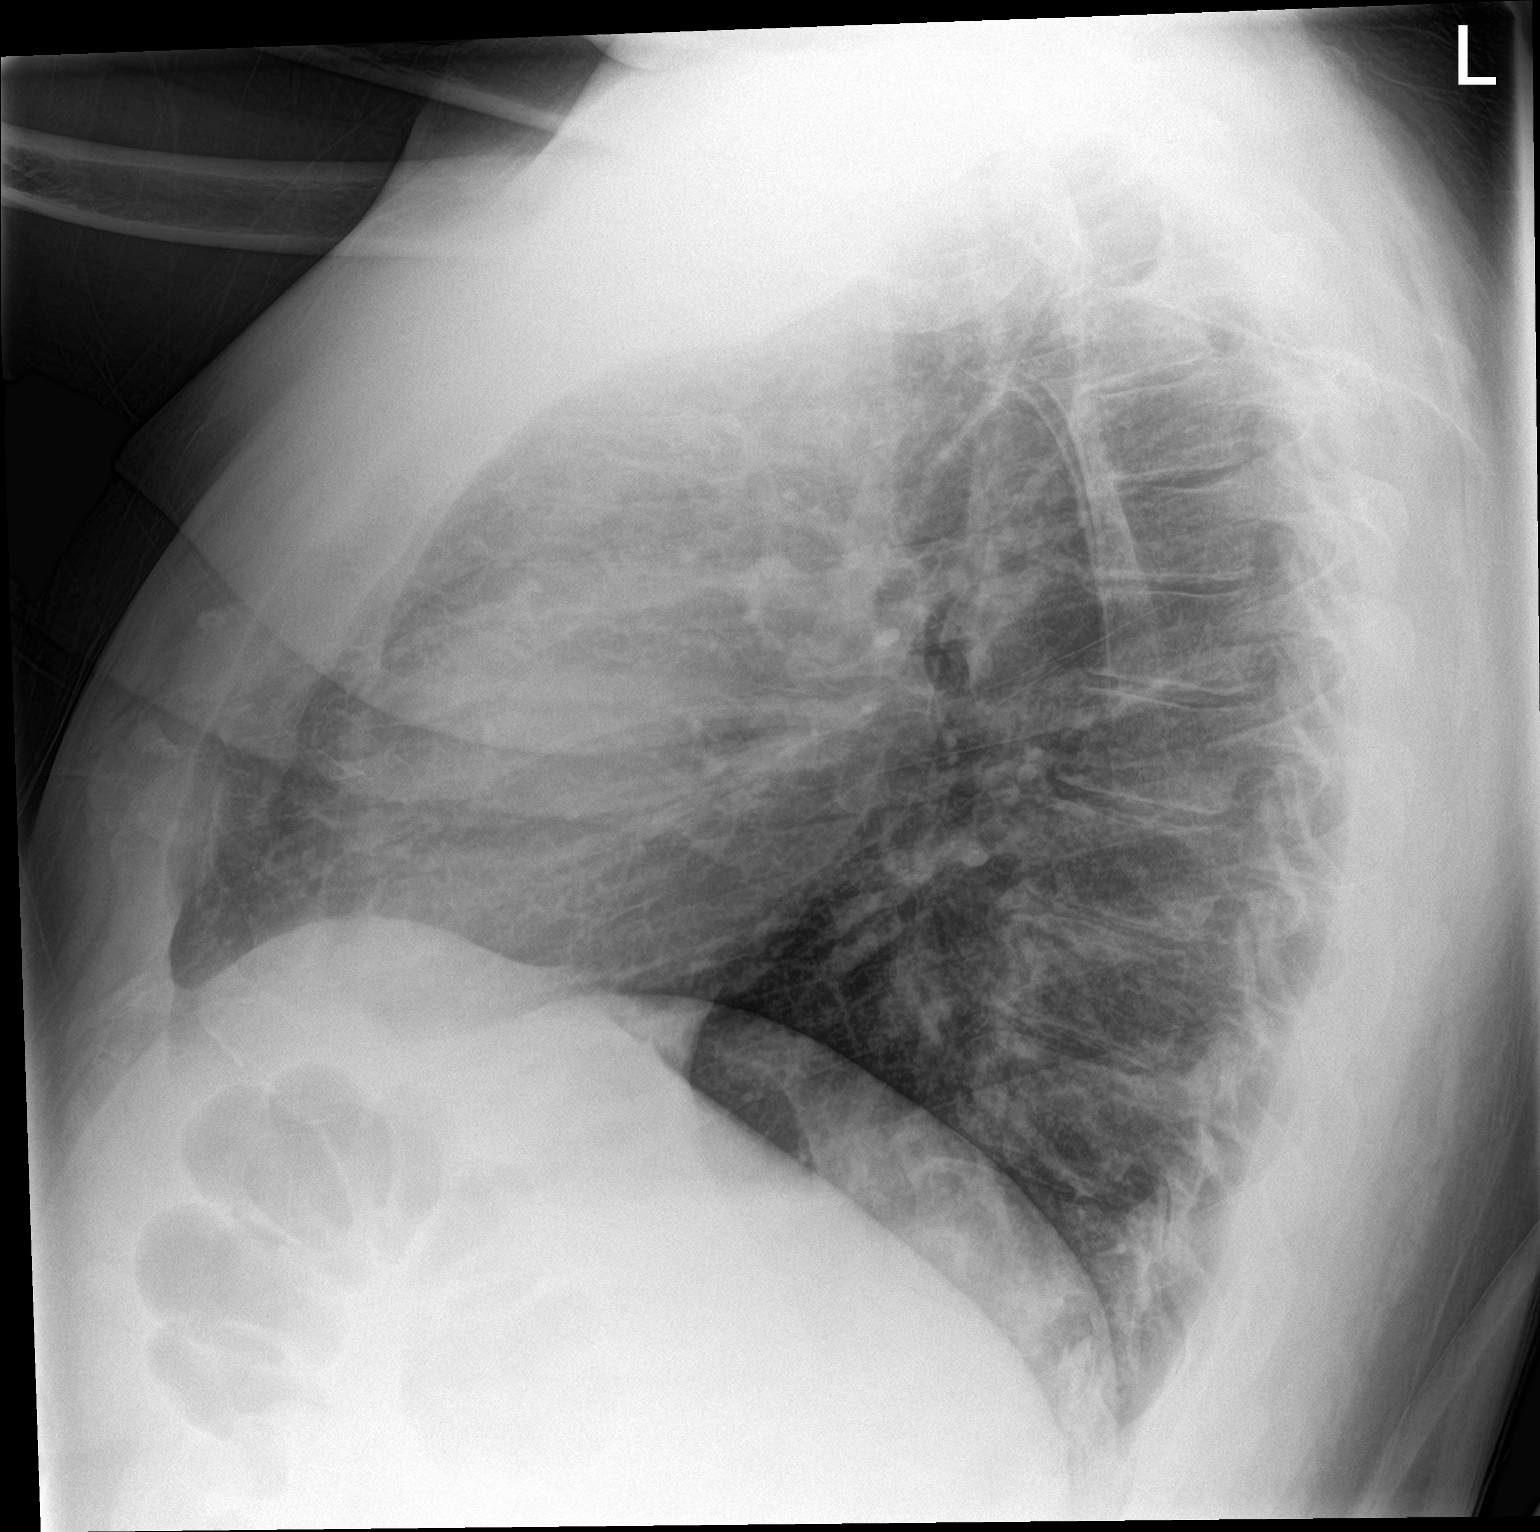

[chest ap]
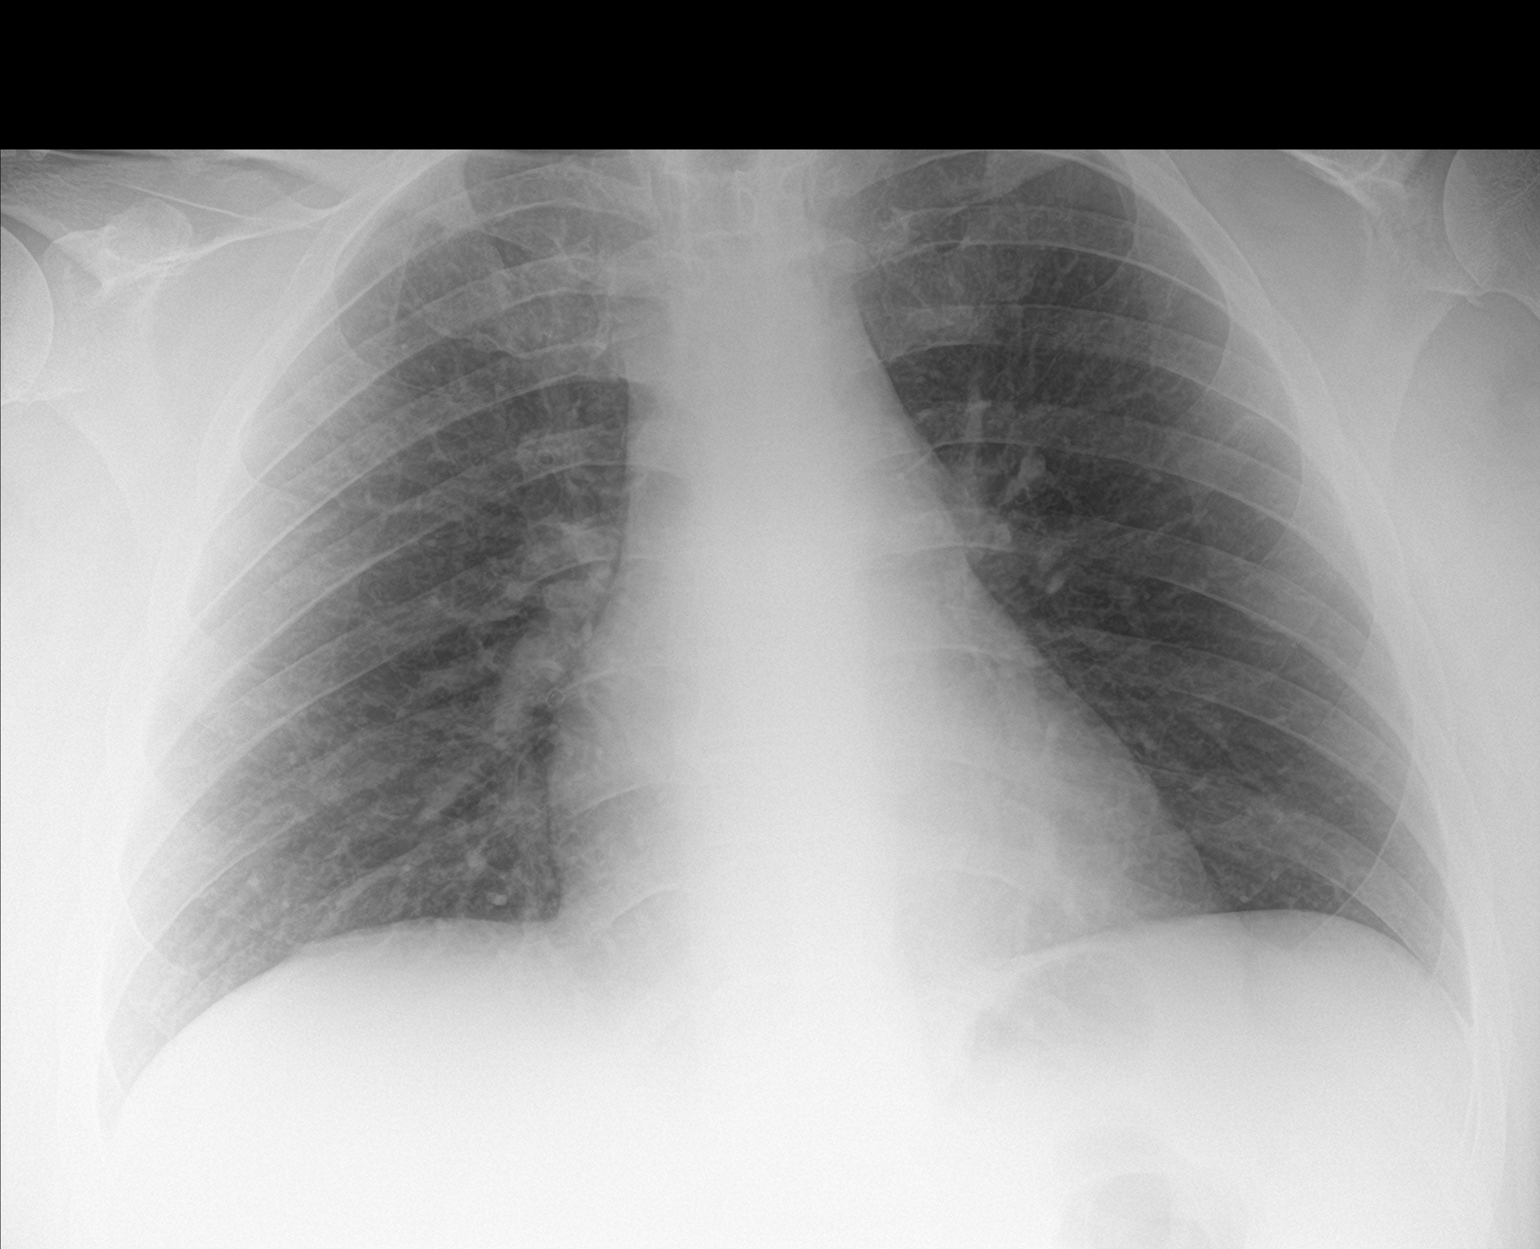

[2 of 2 positions shown; findings below may reference images not displayed]

FINDINGS: No consolidation, features of edema, pneumothorax, or effusion.
Pulmonary vascularity is normally distributed. The cardiomediastinal
contours are unremarkable. No acute osseous or soft tissue
abnormality. Mild degenerative changes in the shoulders and spine.
IMPRESSION: No acute cardiopulmonary abnormality.

## 2020-03-02 MED ORDER — ONDANSETRON HCL 4 MG/2ML IJ SOLN
4.0000 mg | Freq: Once | INTRAMUSCULAR | Status: AC
  Administered 2020-03-02: 4 mg via INTRAVENOUS
  Filled 2020-03-02: qty 2

## 2020-03-02 MED ORDER — LACTATED RINGERS IV SOLN: INTRAVENOUS | Status: DC

## 2020-03-02 MED ORDER — LACTATED RINGERS IV BOLUS (SEPSIS)
400.0000 mL | Freq: Once | INTRAVENOUS | Status: AC
  Administered 2020-03-03: 400 mL via INTRAVENOUS

## 2020-03-02 MED ORDER — LACTATED RINGERS IV BOLUS (SEPSIS)
1000.0000 mL | Freq: Once | INTRAVENOUS | Status: AC
  Administered 2020-03-02: 1000 mL via INTRAVENOUS

## 2020-03-02 MED ORDER — VANCOMYCIN HCL 1250 MG/250ML IV SOLN
1250.0000 mg | Freq: Two times a day (BID) | INTRAVENOUS | Status: DC
  Administered 2020-03-03 – 2020-03-04 (×3): 1250 mg via INTRAVENOUS
  Filled 2020-03-02 (×4): qty 250

## 2020-03-02 MED ORDER — VANCOMYCIN HCL 10 G IV SOLR
2500.0000 mg | Freq: Once | INTRAVENOUS | Status: AC
  Administered 2020-03-02: 2500 mg via INTRAVENOUS
  Filled 2020-03-02: qty 2000

## 2020-03-02 MED ORDER — SODIUM CHLORIDE 0.9 % IV SOLN
2.0000 g | Freq: Once | INTRAVENOUS | Status: AC
  Administered 2020-03-02: 2 g via INTRAVENOUS
  Filled 2020-03-02: qty 20

## 2020-03-02 MED ORDER — VANCOMYCIN HCL IN DEXTROSE 1-5 GM/200ML-% IV SOLN: 1000.0000 mg | Freq: Once | INTRAVENOUS | Status: DC

## 2020-03-02 NOTE — ED Triage Notes (Signed)
Pt here from home for eval of n/v and inability to tolerate PO intake since yesterday. Supposed to be on doxycycline for infection in bilateral feet. Pt tachycardic, hypotensive.

## 2020-03-02 NOTE — ED Provider Notes (Signed)
[facility] EMERGENCY DEPARTMENT Provider Note   CSN: [id number] Arrival date & time: 03/02/20  1816     History No chief complaint on file.   [name] is a 39 y.o. male with past medical history of hypertension, type 2 diabetes, chronic wounds who presents with worsening infection in his left foot ACS and increased swelling in his left great toe and increased pain and redness in his left lower leg for the past 2 to 3 days.  He also endorses significant fevers and chills and vomiting today.  He has not taken any of his metformin in over a month, and he ran out and never thought to get a refill..   Rash Location:  Leg, foot and toe Quality: draining, redness, swelling and weeping   Onset quality:  Gradual Timing:  Constant Progression:  Worsening Chronicity:  Chronic Relieved by:  None tried Worsened by:  Nothing Associated symptoms: fever, nausea and vomiting   Associated symptoms: no abdominal pain, no joint pain, no shortness of breath and no sore throat        Past Medical History:  Diagnosis Date  . Diabetes mellitus without complication (HCC)   . Hypertension     Patient Active Problem List   Diagnosis Date Noted  . Cellulitis of left toe 03/03/2020  . Diabetic ketoacidosis (HCC) 03/03/2020  . Right foot ulcer, limited to breakdown of skin (HCC) 06/06/2019  . Non-pressure chronic ulcer of other part of left foot limited to breakdown of skin (HCC) 06/06/2019  . Toe infection 04/04/2019  . Uncontrolled type 2 diabetes mellitus with hyperglycemia (HCC) 05/02/2018  . 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 Venous insufficiency of both lower extremities 07/30/2017  . Obesity, Class III, BMI 40-49.9 (morbid obesity) (HCC) 07/30/2017  . Essential hypertension 09/03/2016    No past surgical history on file.     Family History  Problem Relation Age of Onset  . Diabetes Mother   . Hyperlipidemia Father   . Hypertension  Father     Social History   Tobacco Use  . Smoking status: Never Smoker  . Smokeless tobacco: Never Used  Vaping Use  . Vaping Use: Never used  Substance Use Topics  . Alcohol use: No    Alcohol/week: 0.0 standard drinks  . Drug use: No    Home Medications Prior to Admission medications   Medication Sig Start Date End Date Taking? Authorizing Provider  acetaminophen (TYLENOL) 500 MG tablet Take 1,000 mg by mouth daily as needed for headache.   Yes [provider]  doxycycline (VIBRA-TABS) 100 MG tablet Take 1 tablet (100 mg total) by mouth 2 (two) times daily. 02/27/20  Yes Persons, [place]  OVER THE COUNTER MEDICATION Apply 1 application topically in the morning and at bedtime. Athlete's foot cream - for jock itch   Yes [provider]  atorvastatin (LIPITOR) 20 MG tablet Take 1 tablet (20 mg total) by mouth daily. Patient not taking: Reported on 03/02/2020 10/19/18   [name], MD  glipiZIDE (GLUCOTROL) 5 MG tablet Take 1 tablet (5 mg total) by mouth 2 (two) times daily before a meal. Patient not taking: Reported on 03/02/2020 10/19/18 03/29/19  [name], MD  lisinopril (ZESTRIL) 20 MG tablet Take 1 tablet (20 mg total) by mouth daily. Patient not taking: Reported on 03/02/2020 10/19/18   [name], MD  metFORMIN (GLUCOPHAGE) 1000 MG tablet Take 1 tablet (1,000 mg total) by  mouth 2 (two) times daily with a meal. Patient not taking: Reported on 03/02/2020 10/19/18   [name], MD  nitroGLYCERIN (NITRODUR - DOSED IN MG/24 HR) 0.2 mg/hr patch Place 1 patch (0.2 mg total) onto the skin daily. Patient not taking: Reported on 03/02/2020 07/11/19   Adonis Huguenin, NP    Allergies    Milk-related compounds and Iodine  Review of Systems   Review of Systems  Constitutional: Positive for fever. Negative for chills.  HENT: Negative for ear pain and sore throat.   Eyes: Negative for pain and visual disturbance.  Respiratory:  Negative for cough and shortness of breath.   Cardiovascular: Negative for chest pain and palpitations.  Gastrointestinal: Positive for nausea and vomiting. Negative for abdominal pain.  Genitourinary: Negative for dysuria and hematuria.  Musculoskeletal: Negative for arthralgias and back pain.  Skin: Positive for rash. Negative for color change.  Neurological: Negative for seizures and syncope.  All other systems reviewed and are negative.   Physical Exam Updated Vital Signs BP 129/83   Pulse (!) 118   Temp 98.7 F (37.1 C) (Oral)   Resp (!) 28   Ht 6\' 1"  (1.854 m)   Wt (!) 154.2 kg   SpO2 98%   BMI 44.86 kg/m   Physical Exam Vitals and nursing note reviewed.  Constitutional:      Appearance: He is well-developed.  HENT:     Head: Normocephalic and atraumatic.  Eyes:     Conjunctiva/sclera: Conjunctivae normal.  Cardiovascular:     Rate and Rhythm: Normal rate and regular rhythm.     Heart sounds: No murmur heard.   Pulmonary:     Effort: Pulmonary effort is normal. No respiratory distress.     Breath sounds: Normal breath sounds.  Abdominal:     Palpations: Abdomen is soft.     Tenderness: There is no abdominal tenderness.  Musculoskeletal:     Cervical back: Neck supple.  Skin:    General: Skin is warm.     Comments: Chronic wounds to bilateral big toes.  Photos of the left toe are below.  Neurological:     General: No focal deficit present.     Mental Status: He is alert and oriented to person, place, and time.          ED Results / Procedures / Treatments   Labs (all labs ordered are listed, but only abnormal results are displayed) Labs Reviewed  COMPREHENSIVE METABOLIC PANEL - Abnormal; Notable for the following components:      Result Value   Sodium 130 (*)    Chloride 95 (*)    CO2 17 (*)    Glucose, Bld 369 (*)    Creatinine, Ser 1.30 (*)    Total Protein 8.3 (*)    Albumin 3.1 (*)    AST 14 (*)    Total Bilirubin 1.4 (*)    Anion gap 18  (*)    All other components within normal limits  LACTIC ACID, PLASMA - Abnormal; Notable for the following components:   Lactic Acid, Venous 2.3 (*)    All other components within normal limits  LACTIC ACID, PLASMA - Abnormal; Notable for the following components:   Lactic Acid, Venous 2.3 (*)    All other components within normal limits  CBC WITH DIFFERENTIAL/PLATELET - Abnormal; Notable for the following components:   WBC 18.5 (*)    Neutro Abs 16.3 (*)    Abs Immature Granulocytes 0.14 (*)  All other components within normal limits  PROTIME-INR - Abnormal; Notable for the following components:   Prothrombin Time 15.7 (*)    INR 1.3 (*)    All other components within normal limits  URINALYSIS, ROUTINE W REFLEX MICROSCOPIC - Abnormal; Notable for the following components:   APPearance HAZY (*)    Glucose, UA >=500 (*)    Hgb urine dipstick SMALL (*)    Ketones, ur 80 (*)    Protein, ur 30 (*)    Leukocytes,Ua SMALL (*)    Bacteria, UA RARE (*)    Non Squamous Epithelial 0-5 (*)    All other components within normal limits  CBG MONITORING, ED - Abnormal; Notable for the following components:   Glucose-Capillary 336 (*)    All other components within normal limits  I-STAT VENOUS BLOOD GAS, ED - Abnormal; Notable for the following components:   pCO2, Ven 28.6 (*)    pO2, Ven 23.0 (*)    Bicarbonate 16.9 (*)    TCO2 18 (*)    Acid-base deficit 7.0 (*)    Sodium 132 (*)    Calcium, Ion 1.10 (*)    All other components within normal limits  RESPIRATORY PANEL BY RT PCR (FLU A&B, COVID)  CULTURE, BLOOD (ROUTINE X 2)  CULTURE, BLOOD (ROUTINE X 2)  HIV ANTIBODY (ROUTINE TESTING W REFLEX)  COMPREHENSIVE METABOLIC PANEL  CBC  BASIC METABOLIC PANEL  BASIC METABOLIC PANEL  BASIC METABOLIC PANEL  BASIC METABOLIC PANEL  BETA-HYDROXYBUTYRIC ACID  BETA-HYDROXYBUTYRIC ACID  BETA-HYDROXYBUTYRIC ACID  HEMOGLOBIN A1C    EKG EKG Interpretation  Date/Time:  Saturday March 02 2020 20:08:53 EDT Ventricular Rate:  127 PR Interval:    QRS Duration: 91 QT Interval:  334 QTC Calculation: 486 R Axis:   50 Text Interpretation: Sinus tachycardia Borderline prolonged QT interval Otherwise within normal limits No old tracing to compare Confirmed by Dione BoozeGlick, David ([id number]) on 03/02/2020 8:15:10 PM   Radiology DG Chest 2 View  Result Date: 03/02/2020 CLINICAL DATA:  Suspected sepsis EXAM: CHEST - 2 VIEW COMPARISON:  None. FINDINGS: No consolidation, features of edema, pneumothorax, or effusion. Pulmonary vascularity is normally distributed. The cardiomediastinal contours are unremarkable. No acute osseous or soft tissue abnormality. Mild degenerative changes in the shoulders and spine. IMPRESSION: No acute cardiopulmonary abnormality. Electronically Signed   By: Kreg ShropshirePrice  DeHay M.D.   On: 03/02/2020 19:09    Procedures Procedures (including critical care time)  Medications Ordered in ED Medications  lactated ringers infusion (has no administration in time range)  lactated ringers bolus 1,000 mL ( Intravenous Restarted 03/02/20 2313)    And  lactated ringers bolus 1,000 mL ( Intravenous Restarted 03/02/20 2313)    And  lactated ringers bolus 400 mL (has no administration in time range)  vancomycin (VANCOREADY) IVPB 1250 mg/250 mL (has no administration in time range)  heparin injection 5,000 Units (has no administration in time range)  acetaminophen (TYLENOL) tablet 650 mg (has no administration in time range)    Or  acetaminophen (TYLENOL) suppository 650 mg (has no administration in time range)  polyethylene glycol (MIRALAX / GLYCOLAX) packet 17 g (has no administration in time range)  insulin regular, human (MYXREDLIN) 100 units/ 100 mL infusion (has no administration in time range)  lactated ringers infusion (has no administration in time range)  dextrose 5 % in lactated ringers infusion (has no administration in time range)  dextrose 50 % solution 0-50 mL (has no  administration in time range)  potassium  chloride 10 mEq in 100 mL IVPB (has no administration in time range)  cefTRIAXone (ROCEPHIN) 2 g in sodium chloride 0.9 % 100 mL IVPB (0 g Intravenous Stopped 03/02/20 2157)  vancomycin (VANCOCIN) 2,500 mg in sodium chloride 0.9 % 500 mL IVPB ( Intravenous Restarted 03/02/20 2313)  ondansetron (ZOFRAN) injection 4 mg (4 mg Intravenous Given 03/02/20 2151)    ED Course  I have reviewed the triage vital signs and the nursing notes.  Pertinent labs & imaging results that were available during my care of the patient were reviewed by me and considered in my medical decision making (see chart for details).    MDM Rules/Calculators/A&P                         He has normal pH on his VBG.  He does in urine likely reflective of starvation ketosis from excessive vomiting earlier today.  Not in DKA.  Based on WBC 18, lactic acid 2.2, tachycardia, code sepsis initiated.  Patient given 30 cc/kg IV fluids and given vancomycin and ceftriaxone for presumed purulent cellulitis.  Plan to obtain MRI to evaluate for osteomyelitis of the left great toe, as this is likely his source of infection..  Patient admitted to a medicine service for further management.  This patient was seen with Dr. Preston Fleeting.  Final Clinical Impression(s) / ED Diagnoses Final diagnoses:  Sepsis with acute renal failure without septic shock, due to unspecified organism, unspecified acute renal failure type (HCC)  Uncontrolled type 2 diabetes mellitus with hyperglycemia [facility])    Rx / DC Orders ED Discharge Orders    None       Allayne Butcher, MD 03/03/20 0454    Dione Booze, MD 03/04/20 [phone]

## 2020-03-02 NOTE — Progress Notes (Signed)
Pharmacy Antibiotic Note  [name] is a 39 y.o. male admitted on 03/02/2020 with cellulitis.  Pharmacy has been consulted for vancomycin dosing. Pt is afebrile but WBC is elevated at 18.5. Scr is slightly elevated at 1.3 and lactic acid is >2.   Plan: Vancomycin 2500mg  IV x 1 then 1250mg  IV Q12H F/u renal fxn, C&S, clinical status and trough at [facility] F/u continuation of gram neg coverage  Height: 6\' 1"  (185.4 cm) Weight: (!) 154.2 kg (340 lb) IBW/kg (Calculated) : 79.9  Temp (24hrs), Avg:98.4 F (36.9 C), Min:98 F (36.7 C), Max:98.7 F (37.1 C)  Recent Labs  Lab 03/02/20 1847  WBC 18.5*  CREATININE 1.30*  LATICACIDVEN 2.3*    Estimated Creatinine Clearance: 117.1 mL/min (A) (by C-G formula based on SCr of 1.3 mg/dL (H)).    Allergies  Allergen Reactions  . Milk-Related Compounds     Antimicrobials this admission: Vanc 10/9>> CTX x 1 10/9  Dose adjustments this admission: N/A  Microbiology results: Pending  Thank you for allowing pharmacy to be a part of this patient's care.  Desira Alessandrini, 03/02/2020 8:56 PM

## 2020-03-02 NOTE — ED Notes (Signed)
Patient transported to MRI 

## 2020-03-02 NOTE — ED Notes (Signed)
Notifies EDP about tachycardia, emesis

## 2020-03-02 NOTE — H&P (Addendum)
Date: 03/03/2020               Patient Name:  [name] MRN: [id number]  DOB: January 20, 1981 Age / Sex: 39 y.o., male   PCP: [name], MD         Medical Service: Internal Medicine Teaching Service         Attending Physician: Dr. Earl Lagos, MD    First Contact: Dr. Morrie Sheldon Pager: [phone]  Second Contact: Dr. Barbaraann Faster Pager: [phone]       After Hours (After 5p/  First Contact Pager: [phone]  weekends / holidays): Second Contact Pager: [phone]   Chief Complaint: Fever  History of Present Illness: [name] is a 39 y.o. 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 He notes that he has had worsening redness and swelling of his left great toe surrounding a chronic ulcer on the bottom of his left great toe over the last several weeks, with redness and mild pain radiating up his left shin. He states he first developed blisters in his bilateral feet back in 2019 from wearing ill-fitting work boots, and his blister subsequently turned into a chronic, worsening ulcer, requiring admissions and antibiotics in the past. He recently saw Dr. Lajoyce Corners 02/27/20 and was prescribed doxycycline 100mg  twice daily. He was instructed to take this for 30 days but states that he has been unable to take his medication since last night, when he developed vomiting. He states he has been nauseas over the past 2 days and has had three episodes of non-bloody, non-bilious emesis since last night; however, his nausea and vomiting resolved after Zofran in the emergency department. He states that he has been prescribed Metformin and Glipizide in the past for diabetes, although has not followed up with a  primary care doctor in quite some time and says he has not taken either of these medications since June. He endorses increased thirst and says he drinks a bottle of water each hour. He also endorses light-headedness and dizziness over the past several days that has worsened his nausea when moving about. He said he previously experienced paraesthesias in his bilateral hands, stating they feel "numb" although this has resolved since being in the ED He does note some numbness of his left great toe and mild pain of his left shin, but otherwise denies pain or numbness. He endorses chronic, generalized diaphoresis and endorses losing about 35 lbs over the past several months, but denies night sweats. His mother notes he has not been dieting, although patient attributes this weight loss to depression. He notes a history of depression, endorsing current suicidal ideation, and attributes a history of medical non-compliance to his depression and notes he stopped taking an antihypertensive in the past due to nausea. His mother notes he has a history of bipolar disorder. He denies any polyuria, urinary frequency, dysuria, hematuria, diarrhea, constipation, myalgias, or any other symptoms.    Home Medications: Current Meds  Medication Sig  . acetaminophen (TYLENOL) 500 MG tablet Take 1,000 mg by mouth daily as needed for headache.  . doxycycline (VIBRA-TABS) 100 MG tablet Take 1 tablet (100 mg total) by mouth 2 (two) times daily.  July OVER THE COUNTER MEDICATION Apply 1 application topically in the morning  and at bedtime. Athlete's foot cream - for jock itch  Metformin 1000mg  bid Glipizide 5mg  daily  Allergies: Allergies as of 03/02/2020 - Review Complete 03/02/2020  Allergen Reaction Noted  . Milk-related compounds Diarrhea 07/30/2017  Patient states he has had a skin reaction to iodine in the past.   Past Medical History:  Diagnosis Date  . Diabetes mellitus without complication (HCC)   . Hypertension     Family History:  Family History  Problem Relation Age of Onset  . Diabetes Mother   . Hyperlipidemia Father   . Hypertension Father   Patient's mother also has a history of HLD.   Surgical History:  Patient endorses tooth surgery in the past. No other surgeries noted.  Social History:  [name] currently lives at home with his parents. 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 He is working on getting disability. He does not have medical insurance. He denies history of tobacco use, alcohol use, or illicit drug use.   Review of Systems: A complete ROS was negative except as per HPI.   Physical Exam: Blood pressure (!) 106/55, pulse (!) 110, temperature 98.7 F (37.1 C), temperature source Oral, resp. rate 15, height 6\' 1"  (1.854 m), weight (!) 154.2 kg, SpO2 99 %. General: Patient appears chronically ill but otherwise well, in no acute distress. Eyes: Sclera non-icteric. No conjunctival injection.  HENT: Moist mucus membranes. No nasal discharge.  Respiratory: Lungs are CTA, bilaterally. No wheezes, rales, or rhonchi. Mild tachypnea present. Cardiovascular: Rate is tachycardic, rhythm is regular. No murmurs, rubs, or gallops. No lower extremity pitting edema. Abdominal: Obese, soft and non-tender to palpation. Bowel sounds intact. No rebound or guarding. Negative Murphy's sign. Neurological: Patient is alert and oriented x 3. Sensation to light touch is absent from the base of the left great toe, distally. Sensation is intact to light palpation everywhere else. ROMI in right and left lower extremities.  Skin: Mildly jaundiced. There is a 1x1cm ulceration on the plantar aspect of the left great toe with yellowing of the surrounding tissue and mild active serosanguinous drainage, without exposed bone. There is significant red/purple erythema and edema of the entire left great toe. Erythema extends up the dorsal/medial aspect of  the left foot with patches of tender erythema extending up the right medial lower leg. There is a 1x0.5cm linear ulceration along the plantar aspect of the right great toe with some surrounding skin breakdown, without visible bone or active drainage. Erythema and edema of the right great toe are more mild without tracking of erythema up RLE. There is scaling along plantar surface of bilateral feet with onychomycosis of toenails. Psych: Patient endorses active SI. He has a slightly limited affect and slightly impaired insight and impaired judgement.         EKG: personally reviewed my interpretation is sinus tachycardia at a rate of 127 bpm with normal PR and QTc intervals. No T wave inversions or ST segment changes concerning for acute ischemia.   CXR: personally reviewed my interpretation is very mild vascular congestion without other acute cardiopulmonary process. No evidence of active infection.  Assessment & Plan by Problem: Active Problems:   Cellulitis of left toe   Diabetic ketoacidosis (HCC)  # Sepsis in setting of LLE Cellulitis due to Infected Diabetic Foot Wound  Patient endorses 3 days of subjective fevers and chills, 2 days of nausea, and 3 episodes of vomiting since yesterday with polydipsia and  malaise. He has been prescribed glipizide 5mg  twice daily and Metformin 1000mg  twice daily in the past, but states he has not seen a PCP in about 1 year and has not taken these medications since June 2021 due to severe depression / SI. His sugars have remained elevated >300 with hemoglobin A1c's noted to be ~11. He has chronic diabetic foot wounds on the plantar aspects of his right and left great toes. His left great toe wound appears to be Wagner Grade 1 although has significant surrounding swelling and edema, extending up the left lower leg. Patient was seen 02/27/20 by Dr. July 2021 who ordered right and left great toe xrays, which showed no evidence of osteomyelitis; he was prescribed  Doxycycline 100mg  twice daily for 30 days, although notes he has not taken this since yesterday morning due to nausea/vomiting. Initial Labs: Glucose 369, bicarbonate 17, anion gap 18, lactic acid 2.3, potassium 4.2. WBC 18.5 with neutrophilia. Urinalysis with glucose > 500, 80 ketones, 30 protein, Hazy with small leukocyte esterase and rare bacteria, although patient denies urinary symptoms. CXR without signs of acute infection.  - Code Sepsis activated  - Continue Vancomycin, dosed per pharmacy - Will switch Ceftriaxone to Cefepime 2g q8 hrs for pseudomonal coverage - Blood cultures pending - Wound Care Consulted  - Check morning CBC, CMP, Lactic Acid - Check HIV antibodies  # DKA with AGMA in the setting of Uncontrolled Type II DM Initial Labs: Glucose 369, bicarbonate 17, anion gap 18, lactic acid 2.3, potassium 4.2, and U/A showing glucose >500 and 80 ketones. VBG shows pH 7.379, pCO2 28.6, pO2 23.0, and Bicarb 16.9. Anion gap 18 and LA 2.3. Likely due to lactic acidosis in the setting of sepsis, also with mild DKA. Patient received 2L LR bolus in the ED. - Will start EndoTool protocol for DKA with insulin infusion - Will check BMP q4 hours until gap closes x 2  - Check Beta hydroxybutyrate q8 hours  - Check HbA1c - Will start LR infusion 117mL/hr  - Start D5%LR if CBG <250  - KCl replacement as needed - DM coordinator consulted - Made NPO except for sips with meds - Tylenol 650 q6hrs PRN for pain  # Acute Kidney Injury  Creatinine 1.30, BUN 17. U/A showed small hemoglobin with 6-10 RBC, 6-10 WBC, small leukocyte esterase, rare bacteria and 30 protein, although patient denies any dysuria, frequency, polyuria, or hematuria. Patient does not have a history of chronic kidney disease. Very likely prerenal in nature given intravascular volume depletion from hyperglycemia.   - Avoid nephrotoxic agents - Continue LR infusion 125mL/hr and fluid boluses as needed   # Hyperbilirubinemia Total  bilirubin 1.4. No transaminitis or ALP elevation. Likely in the setting of sepsis. - check morning CMP  # Hypertension Blood pressure well controlled currently not on antihypertensives. Patient had been prescribed lisinopril 20mg  outpatient but had not been taking this. He states he has tried taking unknown antihypertensives in the past with resulting nausea.  - Will hold home lisinopril 20mg  daily given AKI - Continue to monitor   # Hyperlipidemia  Chart review notes a history of hyperlipidemia requiring Atorvastatin 20mg  daily, although patient denies known history or statin use.  - Follow up outpatient   # Active Suicidal Ideation Patient notes a long history of depression. He notes active suicidal ideation and states this comes and goes monthly. He notes history of poor medical non-compliance, recent job loss, weight loss of 30-40 lbs, which he all attributes to depression.  His mother notes a history of bipolar disorder. When asked whether he would like to be resuscitated or not, he states "now would be a bad time to answer that, because I'm depressed". He is able to verbally confirm he would like temporary intubation only if there is a reasonable chance of successful extubation with reasonable quality of life and says he would not like CPR or prolonged intubation.  - Patient will require outpatient psychiatry referral - Please continue code status discussions - TOC consulted for PCP needs  Dispo: Admit patient to Inpatient with expected length of stay greater than 2 midnights.   Diet: NPO except for sips with meds IVF: LR @ 18525mL/hr per Endotool protocol  DVT PPx: Heparin  Code status: Partial - Intubation, no CPR   Signed: Glenford BayleySpeakman, Tylar Amborn, MD 03/03/2020, 1:58 AM  Pager: [phone] After 5pm on weekdays and 1pm on weekends: On Call pager: [phone]

## 2020-03-03 ENCOUNTER — Inpatient Hospital Stay (HOSPITAL_COMMUNITY): Payer: Self-pay

## 2020-03-03 ENCOUNTER — Encounter (HOSPITAL_COMMUNITY): Payer: Self-pay | Admitting: Internal Medicine

## 2020-03-03 DIAGNOSIS — E785 Hyperlipidemia, unspecified: Secondary | ICD-10-CM

## 2020-03-03 DIAGNOSIS — R000 Tachycardia, unspecified: Secondary | ICD-10-CM

## 2020-03-03 DIAGNOSIS — L03032 Cellulitis of left toe: Secondary | ICD-10-CM | POA: Diagnosis present

## 2020-03-03 DIAGNOSIS — I10 Essential (primary) hypertension: Secondary | ICD-10-CM

## 2020-03-03 DIAGNOSIS — E11621 Type 2 diabetes mellitus with foot ulcer: Secondary | ICD-10-CM

## 2020-03-03 DIAGNOSIS — E1110 Type 2 diabetes mellitus with ketoacidosis without coma: Secondary | ICD-10-CM | POA: Diagnosis present

## 2020-03-03 LAB — CBC
HCT: 39.1 % (ref 39.0–52.0)
Hemoglobin: 12.8 g/dL — ABNORMAL LOW (ref 13.0–17.0)
MCH: 27.6 pg (ref 26.0–34.0)
MCHC: 32.7 g/dL (ref 30.0–36.0)
MCV: 84.4 fL (ref 80.0–100.0)
Platelets: 244 10*3/uL (ref 150–400)
RBC: 4.63 MIL/uL (ref 4.22–5.81)
RDW: 14 % (ref 11.5–15.5)
WBC: 14.4 10*3/uL — ABNORMAL HIGH (ref 4.0–10.5)
nRBC: 0 % (ref 0.0–0.2)

## 2020-03-03 LAB — BASIC METABOLIC PANEL
Anion gap: 15 (ref 5–15)
Anion gap: 10 (ref 5–15)
Anion gap: 10 (ref 5–15)
Anion gap: 10 (ref 5–15)
BUN: 15 mg/dL (ref 6–20)
BUN: 11 mg/dL (ref 6–20)
BUN: 11 mg/dL (ref 6–20)
BUN: 10 mg/dL (ref 6–20)
CO2: 17 mmol/L — ABNORMAL LOW (ref 22–32)
CO2: 22 mmol/L (ref 22–32)
CO2: 22 mmol/L (ref 22–32)
CO2: 21 mmol/L — ABNORMAL LOW (ref 22–32)
Calcium: 8.4 mg/dL — ABNORMAL LOW (ref 8.9–10.3)
Calcium: 8.5 mg/dL — ABNORMAL LOW (ref 8.9–10.3)
Calcium: 8.3 mg/dL — ABNORMAL LOW (ref 8.9–10.3)
Calcium: 8.3 mg/dL — ABNORMAL LOW (ref 8.9–10.3)
Chloride: 98 mmol/L (ref 98–111)
Chloride: 103 mmol/L (ref 98–111)
Chloride: 103 mmol/L (ref 98–111)
Chloride: 103 mmol/L (ref 98–111)
Creatinine, Ser: 0.99 mg/dL (ref 0.61–1.24)
Creatinine, Ser: 0.68 mg/dL (ref 0.61–1.24)
Creatinine, Ser: 0.67 mg/dL (ref 0.61–1.24)
Creatinine, Ser: 0.63 mg/dL (ref 0.61–1.24)
GFR, Estimated: >60 mL/min (ref >60)
GFR, Estimated: >60 mL/min (ref >60)
GFR, Estimated: >60 mL/min (ref >60)
GFR, Estimated: >60 mL/min (ref >60)
Glucose, Bld: 364 mg/dL — ABNORMAL HIGH (ref 70–99)
Glucose, Bld: 291 mg/dL — ABNORMAL HIGH (ref 70–99)
Glucose, Bld: 318 mg/dL — ABNORMAL HIGH (ref 70–99)
Glucose, Bld: 213 mg/dL — ABNORMAL HIGH (ref 70–99)
Potassium: 4 mmol/L (ref 3.5–5.1)
Potassium: 3.5 mmol/L (ref 3.5–5.1)
Potassium: 3.5 mmol/L (ref 3.5–5.1)
Potassium: 3.8 mmol/L (ref 3.5–5.1)
Sodium: 130 mmol/L — ABNORMAL LOW (ref 135–145)
Sodium: 135 mmol/L (ref 135–145)
Sodium: 135 mmol/L (ref 135–145)
Sodium: 134 mmol/L — ABNORMAL LOW (ref 135–145)

## 2020-03-03 LAB — BLOOD CULTURE ID PANEL (REFLEXED) - BCID2

## 2020-03-03 LAB — COMPREHENSIVE METABOLIC PANEL
ALT: 11 U/L (ref 0–44)
AST: 13 U/L — ABNORMAL LOW (ref 15–41)
Albumin: 2.5 g/dL — ABNORMAL LOW (ref 3.5–5.0)
Alkaline Phosphatase: 66 U/L (ref 38–126)
Anion gap: 10 (ref 5–15)
BUN: 12 mg/dL (ref 6–20)
CO2: 22 mmol/L (ref 22–32)
Calcium: 8.6 mg/dL — ABNORMAL LOW (ref 8.9–10.3)
Chloride: 102 mmol/L (ref 98–111)
Creatinine, Ser: 0.77 mg/dL (ref 0.61–1.24)
GFR, Estimated: >60 mL/min (ref >60)
Glucose, Bld: 242 mg/dL — ABNORMAL HIGH (ref 70–99)
Potassium: 3.7 mmol/L (ref 3.5–5.1)
Sodium: 134 mmol/L — ABNORMAL LOW (ref 135–145)
Total Bilirubin: 0.8 mg/dL (ref 0.3–1.2)
Total Protein: 7 g/dL (ref 6.5–8.1)

## 2020-03-03 LAB — HIV ANTIBODY (ROUTINE TESTING W REFLEX): HIV Screen 4th Generation wRfx: NONREACTIVE

## 2020-03-03 LAB — CBG MONITORING, ED
Glucose-Capillary: 178 mg/dL — ABNORMAL HIGH (ref 70–99)
Glucose-Capillary: 188 mg/dL — ABNORMAL HIGH (ref 70–99)
Glucose-Capillary: 191 mg/dL — ABNORMAL HIGH (ref 70–99)
Glucose-Capillary: 196 mg/dL — ABNORMAL HIGH (ref 70–99)
Glucose-Capillary: 203 mg/dL — ABNORMAL HIGH (ref 70–99)
Glucose-Capillary: 209 mg/dL — ABNORMAL HIGH (ref 70–99)
Glucose-Capillary: 231 mg/dL — ABNORMAL HIGH (ref 70–99)
Glucose-Capillary: 232 mg/dL — ABNORMAL HIGH (ref 70–99)
Glucose-Capillary: 238 mg/dL — ABNORMAL HIGH (ref 70–99)
Glucose-Capillary: 244 mg/dL — ABNORMAL HIGH (ref 70–99)
Glucose-Capillary: 244 mg/dL — ABNORMAL HIGH (ref 70–99)
Glucose-Capillary: 281 mg/dL — ABNORMAL HIGH (ref 70–99)
Glucose-Capillary: 311 mg/dL — ABNORMAL HIGH (ref 70–99)
Glucose-Capillary: 366 mg/dL — ABNORMAL HIGH (ref 70–99)

## 2020-03-03 LAB — GLUCOSE, CAPILLARY: Glucose-Capillary: 265 mg/dL — ABNORMAL HIGH (ref 70–99)

## 2020-03-03 LAB — LACTIC ACID, PLASMA: Lactic Acid, Venous: 1.6 mmol/L (ref 0.5–1.9)

## 2020-03-03 LAB — HEMOGLOBIN A1C
Hgb A1c MFr Bld: 11.8 % — ABNORMAL HIGH (ref 4.8–5.6)
Mean Plasma Glucose: 291.96 mg/dL

## 2020-03-03 LAB — MRSA PCR SCREENING: MRSA by PCR: NEGATIVE

## 2020-03-03 LAB — BETA-HYDROXYBUTYRIC ACID
Beta-Hydroxybutyric Acid: 3.94 mmol/L — ABNORMAL HIGH (ref 0.05–0.27)
Beta-Hydroxybutyric Acid: 1.14 mmol/L — ABNORMAL HIGH (ref 0.05–0.27)
Beta-Hydroxybutyric Acid: 1.29 mmol/L — ABNORMAL HIGH (ref 0.05–0.27)

## 2020-03-03 IMAGING — MR MR FOOT*R* W/O CM
5 series · 40 of 40 positions shown · non-contrast
Comparison: Radiographs [DATE] and [DATE]

CLINICAL DATA: Bilateral great toe wounds. Open draining wound on
the right. Diabetes. Clinical concern for osteomyelitis.

EXAM:
MRI OF THE RIGHT FOREFOOT WITHOUT CONTRAST
TECHNIQUE: Multiplanar, multisequence MR imaging of the right forefoot was
performed. No intravenous contrast was administered.

[Series 4: T1 · coronal · right · 3.0mm · 0.70mm/px · 9 of 44 slices shown (1 of 2)]
[im 1/44]
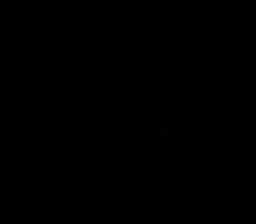
[im 6/44]
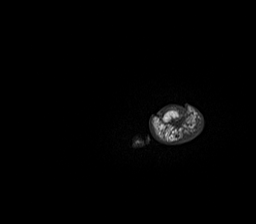
[im 11/44]
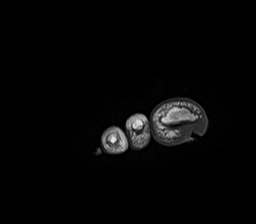
[im 17/44]
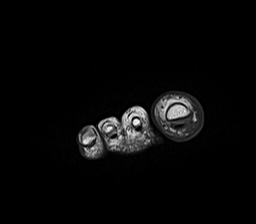
[im 22/44]
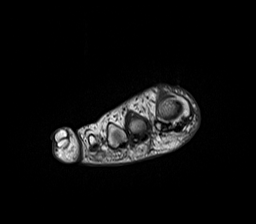
[im 27/44]
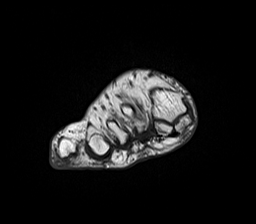
[im 33/44]
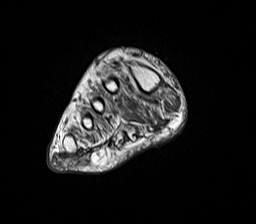
[im 38/44]
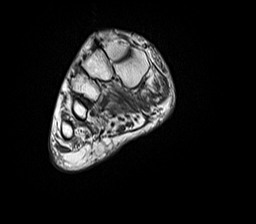
[im 44/44]
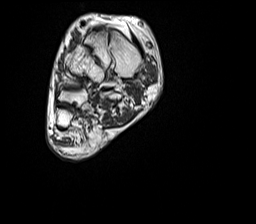

[Series 5: T2 fat-sat · coronal · right · 3.0mm · 0.70mm/px · 10 of 44 slices shown (1 of 2)]
[im 1/44]
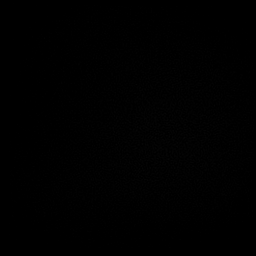
[im 5/44]
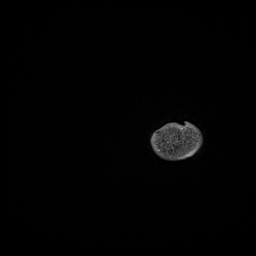
[im 10/44]
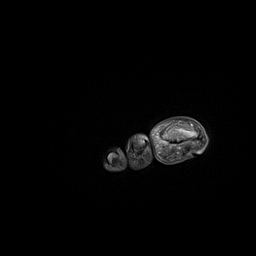
[im 15/44]
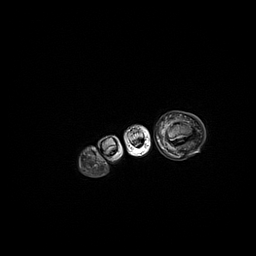
[im 20/44]
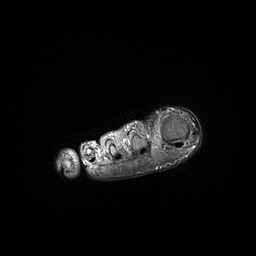
[im 24/44]
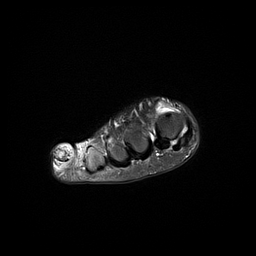
[im 29/44]
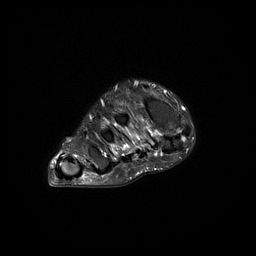
[im 34/44]
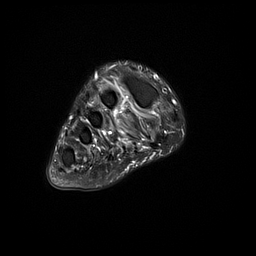
[im 39/44]
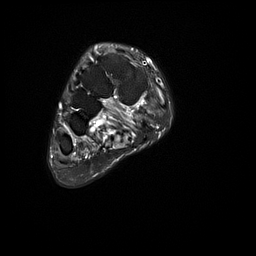
[im 44/44]
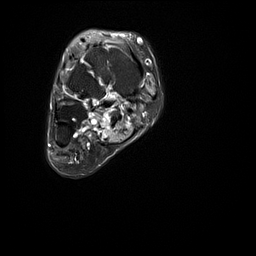

[Series 6: T1 · axial · right · 3.0mm · 0.52mm/px · z∈[-151,-69]mm · 6 of 24 slices shown (2 of 2)]
[im 1/24]
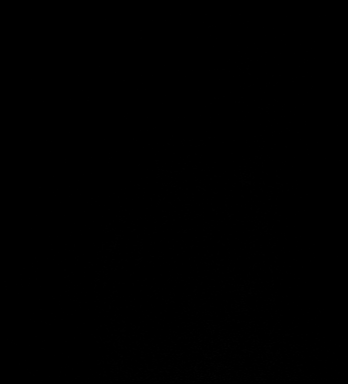
[im 5/24]
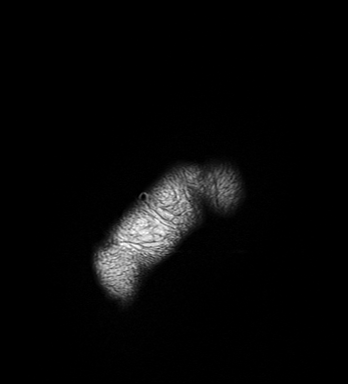
[im 10/24]
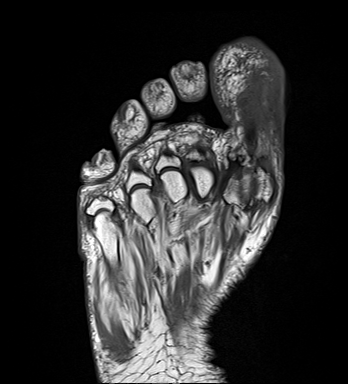
[im 14/24]
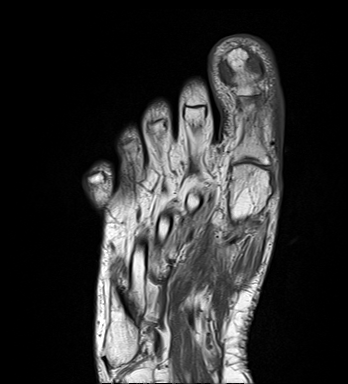
[im 19/24]
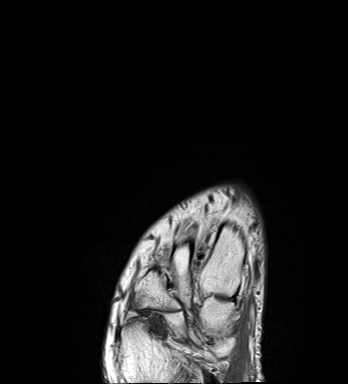
[im 24/24]
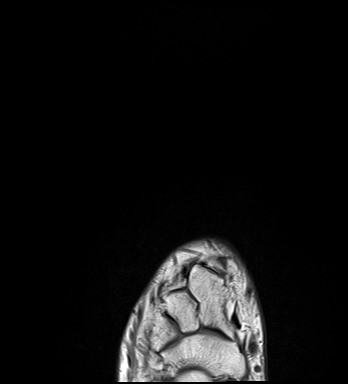

[Series 9: T2 fat-sat · axial · right · 3.0mm · 0.60mm/px · z∈[-154,-71]mm · 6 of 24 slices shown (2 of 2)]
[im 1/24]
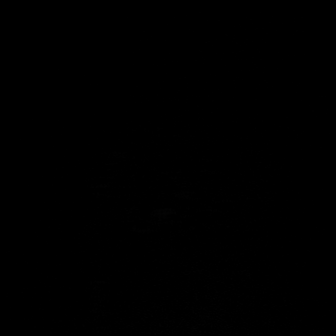
[im 5/24]
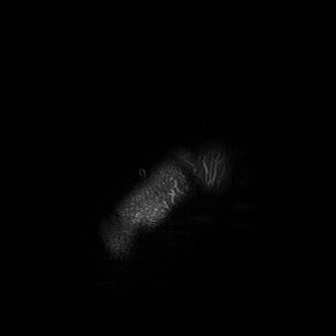
[im 10/24]
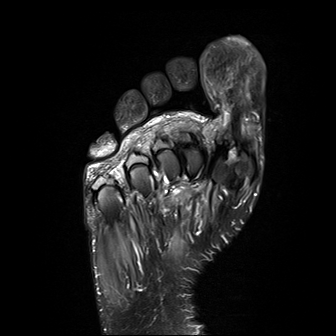
[im 14/24]
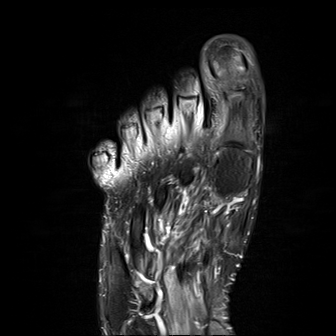
[im 19/24]
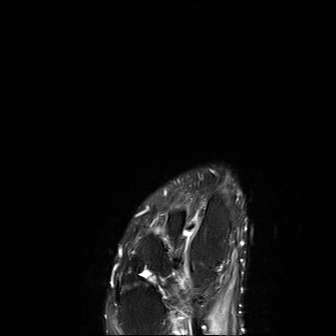
[im 24/24]
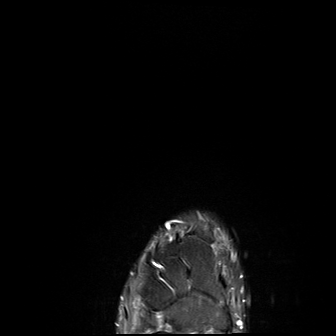

[Series 10: STIR · sagittal · right · 3.0mm · 0.70mm/px · 9 of 38 slices shown]
[im 1/38]
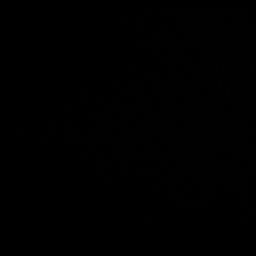
[im 5/38]
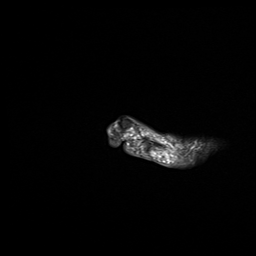
[im 10/38]
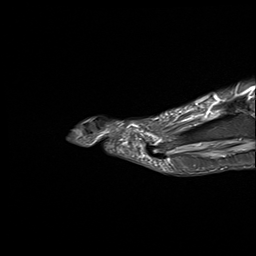
[im 14/38]
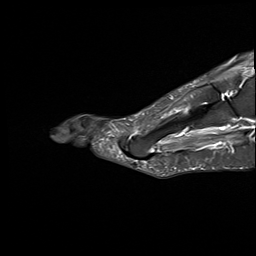
[im 19/38]
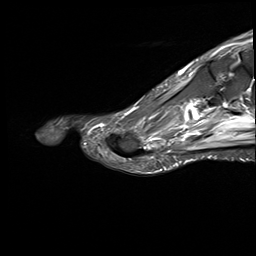
[im 24/38]
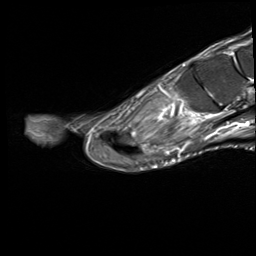
[im 28/38]
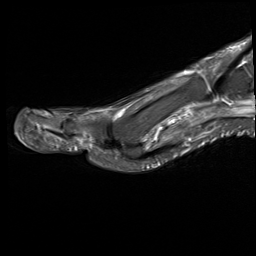
[im 33/38]
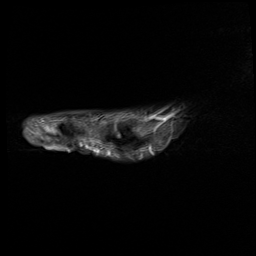
[im 38/38]
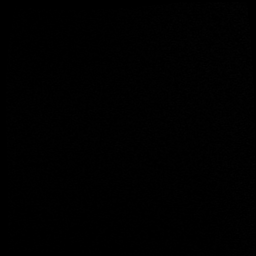

[40 of 40 positions shown; findings below may reference images not displayed]

FINDINGS: Bones/Joint/Cartilage

Prominent T2 marrow hyperintensity medially in the base of the
distal 1st phalanx, adjacent to skin ulceration described below,
suspicious for early osteomyelitis. No cortical destruction or focal
T1 marrow signal abnormality. No significant interphalangeal joint
effusion. No abnormal signal within the 1st proximal phalanx. The
additional toes and metatarsals appear normal.

Mild degenerative changes at the 1st metatarsophalangeal joint and
it sesamoids associated with a small joint effusion. Normal
alignment at the Lisfranc joint.

Ligaments

Intact Lisfranc ligament. The collateral ligaments of the
metatarsophalangeal joints are intact.

Muscles and Tendons

Intact flexor and extensor tendons without tenosynovitis. Mild
nonspecific muscular T2 hyperintensity, likely diabetic myopathy.

Soft tissues

There is focal skin ulceration plantar to the interphalangeal joint
with surrounding soft tissue swelling. Possible small sinus tract
extending to the distal 1st phalangeal base, best seen on the axial
images. No focal fluid collection, foreign body or definite soft
tissue emphysema.
IMPRESSION: 1. Focal skin ulceration plantar to the interphalangeal joint with
surrounding soft tissue swelling and possible small sinus tract
extending to the distal 1st phalangeal base. No focal fluid
collection, foreign body or definite soft tissue emphysema.
2. Prominent T2 marrow hyperintensity medially in the base of the
distal 1st phalanx, suspicious for early osteomyelitis. No cortical
destruction or focal T1 marrow signal abnormality.
3. Mild degenerative changes at the 1st metatarsophalangeal joint
and it sesamoids associated with a small joint effusion.

## 2020-03-03 MED ORDER — POLYETHYLENE GLYCOL 3350 17 G PO PACK: 17.0000 g | PACK | Freq: Every day | ORAL | Status: DC | PRN

## 2020-03-03 MED ORDER — INSULIN GLARGINE 100 UNIT/ML ~~LOC~~ SOLN
40.0000 [IU] | Freq: Every day | SUBCUTANEOUS | Status: DC
  Administered 2020-03-03 – 2020-03-04 (×2): 40 [IU] via SUBCUTANEOUS
  Filled 2020-03-03 (×2): qty 0.4

## 2020-03-03 MED ORDER — INSULIN ASPART 100 UNIT/ML ~~LOC~~ SOLN
0.0000 [IU] | Freq: Every day | SUBCUTANEOUS | Status: DC
  Administered 2020-03-03: 3 [IU] via SUBCUTANEOUS
  Administered 2020-03-04 – 2020-03-07 (×3): 2 [IU] via SUBCUTANEOUS

## 2020-03-03 MED ORDER — DEXTROSE IN LACTATED RINGERS 5 % IV SOLN: INTRAVENOUS | Status: DC

## 2020-03-03 MED ORDER — HEPARIN SODIUM (PORCINE) 5000 UNIT/ML IJ SOLN
5000.0000 [IU] | Freq: Three times a day (TID) | INTRAMUSCULAR | Status: DC
  Administered 2020-03-03 – 2020-03-08 (×14): 5000 [IU] via SUBCUTANEOUS
  Filled 2020-03-03 (×14): qty 1

## 2020-03-03 MED ORDER — INSULIN REGULAR(HUMAN) IN NACL 100-0.9 UT/100ML-% IV SOLN
INTRAVENOUS | Status: DC
  Administered 2020-03-03: 8.5 [IU]/h via INTRAVENOUS
  Filled 2020-03-03 (×2): qty 100

## 2020-03-03 MED ORDER — SODIUM CHLORIDE 0.9 % IV SOLN
2.0000 g | Freq: Three times a day (TID) | INTRAVENOUS | Status: DC
  Administered 2020-03-03 – 2020-03-04 (×4): 2 g via INTRAVENOUS
  Filled 2020-03-03 (×4): qty 2

## 2020-03-03 MED ORDER — ACETAMINOPHEN 325 MG PO TABS: 650.0000 mg | ORAL_TABLET | Freq: Four times a day (QID) | ORAL | Status: DC | PRN

## 2020-03-03 MED ORDER — LACTATED RINGERS IV SOLN: INTRAVENOUS | Status: DC

## 2020-03-03 MED ORDER — INSULIN ASPART 100 UNIT/ML ~~LOC~~ SOLN
0.0000 [IU] | Freq: Three times a day (TID) | SUBCUTANEOUS | Status: DC
  Administered 2020-03-03: 5 [IU] via SUBCUTANEOUS
  Administered 2020-03-03: 3 [IU] via SUBCUTANEOUS
  Administered 2020-03-04 (×3): 5 [IU] via SUBCUTANEOUS
  Administered 2020-03-05: 3 [IU] via SUBCUTANEOUS
  Administered 2020-03-05 (×2): 8 [IU] via SUBCUTANEOUS
  Administered 2020-03-06 (×2): 3 [IU] via SUBCUTANEOUS
  Administered 2020-03-06: 5 [IU] via SUBCUTANEOUS
  Administered 2020-03-07: 3 [IU] via SUBCUTANEOUS
  Administered 2020-03-07: 5 [IU] via SUBCUTANEOUS
  Administered 2020-03-07 – 2020-03-08 (×2): 3 [IU] via SUBCUTANEOUS
  Administered 2020-03-08: 2 [IU] via SUBCUTANEOUS
  Administered 2020-03-08: 8 [IU] via SUBCUTANEOUS

## 2020-03-03 MED ORDER — ACETAMINOPHEN 650 MG RE SUPP: 650.0000 mg | Freq: Four times a day (QID) | RECTAL | Status: DC | PRN

## 2020-03-03 MED ORDER — DEXTROSE 50 % IV SOLN: 0.0000 mL | INTRAVENOUS | Status: DC | PRN

## 2020-03-03 MED ORDER — METRONIDAZOLE IN NACL 5-0.79 MG/ML-% IV SOLN
500.0000 mg | Freq: Three times a day (TID) | INTRAVENOUS | Status: DC
  Administered 2020-03-03 – 2020-03-04 (×4): 500 mg via INTRAVENOUS
  Filled 2020-03-03 (×4): qty 100

## 2020-03-03 MED ORDER — POTASSIUM CHLORIDE 10 MEQ/100ML IV SOLN
10.0000 meq | INTRAVENOUS | Status: AC
  Administered 2020-03-03 (×2): 10 meq via INTRAVENOUS
  Filled 2020-03-03 (×2): qty 100

## 2020-03-03 NOTE — ED Notes (Signed)
Per endo tool, no change needed at this time

## 2020-03-03 NOTE — Progress Notes (Addendum)
Inpatient Diabetes Program Recommendations  AACE/ADA: New Consensus Statement on Inpatient Glycemic Control (2015)  Target Ranges:  Prepandial:   less than 140 mg/dL      Peak postprandial:   less than 180 mg/dL (1-2 hours)      Critically ill patients:  140 - 180 mg/dL   Lab Results  Component Value Date   GLUCAP 244 (H) 03/03/2020   HGBA1C 11.8 (H) 03/03/2020    Review of Glycemic Control  Diabetes history: DM 2 Outpatient Diabetes medications: Glipizide and metformin in the past Current orders for Inpatient glycemic control: IV insulin  A1c 11.8% this admission  Inpatient Diabetes Program Recommendations:    A1c 11.8% on 10/10  Pt currently on IV insulin, glucose still in 200 range with gtt rate 5-6.5 units/hour.  At time of transition consider: -   Lantus 40 units (little less than 0.3 units/kg) -  Novolog 0-15 units tid or Q4 hours if NPO  Diabetes Coordinator will see pt this admission Note: no insurance noted Note: pt with depression, will need to be addressed for optimal glucose control no matter how much education we can provide for him this admission.   Thanks,  Christena Deem RN, MSN, BC-ADM Inpatient Diabetes Coordinator Team Pager [phone] (8a-5p)

## 2020-03-03 NOTE — ED Notes (Signed)
Requested insulin a second time. Medication not in department.

## 2020-03-03 NOTE — Hospital Course (Addendum)
[name] is a 39 y.o. 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  Sepsis in setting of LLE Cellulitis due to Infected Diabetic Foot Wound  Patient was noted to have ulcers of the big to of both the Right and Left foot. MRI of the Right and Left foot noted possibly early osteomyelitis. Patient was also noted to have rising cellulitis to the mid-shin on the left leg. 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  ID was consulted and recommended that despite 1 toe amputation the patient should have 6 week course of IV medications.Patient was started on Vanc and cefepinme per medicine team. Bcx resulted Strep B. Patient was switched to PCN G IV. After amputation patient began to have improvement in his WBC and overall clinical exam. Bcx began to grow GNR and ID recommended stopping PCN G and starting Unasyn to cover possible anaerobe. Patient was unable to have PICC placed for outpatient tx due to lack of insurance and was likely not going to be able to afford the medication. ID felt that patient has shown significant improvement already and recommended Oritavancin with flagyl and follow-up in 2 weeks for Augmentin course.     DKA with AGMA in the setting of Uncontrolled Type II DM Patient treated per DKA protocol with insulin gtt, fluids, Kcl. Patient eventually transitioned to PO insulin and Carb modified diet. Patient BGL maintained on 40-45 U Lantus and moderate sliding scale.   Penile Rash Patient was noted to have white appearing fungal rash on his penis. He is uncircumcised and was started on Nystatin. Patient reported improvement.   Acute Kidney Injury  Patient received fluids as per DKA protocol BUN/Cr improved to  baseline.  Hyperbilirubinemia Patient Bilirubinemia resolved with treatment of Mild DKA and osteomyelitis.   Mood Disorder Patient was recorded as Active SI on admission; however upon psychiatry consultation this was determined to be passive. Patient does not have Bipolar disorder despite reporting he does. Patient has never had manic episodes. Patient was restarted on Zoloft and recommended to follow-up outpatient. Mood remained stable inpatient.

## 2020-03-03 NOTE — Progress Notes (Signed)
PHARMACY - PHYSICIAN COMMUNICATION CRITICAL VALUE ALERT - BLOOD CULTURE IDENTIFICATION (BCID)  [name] is an 39 y.o. male who presented to [facility] on [facility]/02/2020 with a chief complaint of R and L toe cellulitis  Assessment:  Pt with 1/4 blood culture bottles growing strep agalactiae  Name of physician (or Provider) Contacted: Dr. Claudette Laws  Current antibiotics: Vancomycin and Cefepime  Changes to prescribed antibiotics recommended:  Recommend narrow to PCN G 4 million units q4h. Resident to d/w rounding team  Results for orders placed or performed during the hospital encounter of 03/02/20  Blood Culture ID Panel (Reflexed) (Collected: 03/02/2020  8:10 PM)  Result Value Ref Range   Enterococcus faecalis NOT DETECTED NOT DETECTED   Enterococcus Faecium NOT DETECTED NOT DETECTED   Listeria monocytogenes NOT DETECTED NOT DETECTED   Staphylococcus species NOT DETECTED NOT DETECTED   Staphylococcus aureus (BCID) NOT DETECTED NOT DETECTED   Staphylococcus epidermidis NOT DETECTED NOT DETECTED   Staphylococcus lugdunensis NOT DETECTED NOT DETECTED   Streptococcus species DETECTED (A) NOT DETECTED   Streptococcus agalactiae DETECTED (A) NOT DETECTED   Streptococcus pneumoniae NOT DETECTED NOT DETECTED   Streptococcus pyogenes NOT DETECTED NOT DETECTED   A.calcoaceticus-baumannii NOT DETECTED NOT DETECTED   Bacteroides fragilis NOT DETECTED NOT DETECTED   Enterobacterales NOT DETECTED NOT DETECTED   Enterobacter cloacae complex NOT DETECTED NOT DETECTED   Escherichia coli NOT DETECTED NOT DETECTED   Klebsiella aerogenes NOT DETECTED NOT DETECTED   Klebsiella oxytoca NOT DETECTED NOT DETECTED   Klebsiella pneumoniae NOT DETECTED NOT DETECTED   Proteus species NOT DETECTED NOT DETECTED   Salmonella species NOT DETECTED NOT DETECTED   Serratia marcescens NOT DETECTED NOT DETECTED   Haemophilus influenzae NOT DETECTED NOT DETECTED   Neisseria meningitidis NOT DETECTED NOT DETECTED    Pseudomonas aeruginosa NOT DETECTED NOT DETECTED   Stenotrophomonas maltophilia NOT DETECTED NOT DETECTED   Candida albicans NOT DETECTED NOT DETECTED   Candida auris NOT DETECTED NOT DETECTED   Candida glabrata NOT DETECTED NOT DETECTED   Candida krusei NOT DETECTED NOT DETECTED   Candida parapsilosis NOT DETECTED NOT DETECTED   Candida tropicalis NOT DETECTED NOT DETECTED   Cryptococcus neoformans/gattii NOT DETECTED NOT DETECTED    Christoper Fabian, PharmD, BCPS Please see amion for complete clinical pharmacist phone list 03/03/2020  6:42 PM

## 2020-03-03 NOTE — Consult Note (Signed)
WOC Nurse Consult Note: Reason for Consult:Full thickness wound on plantar aspect of left great toe. See photos taken in the ED and uploaded to the EMR. Patient has been seen for this wound as recently as 10/5 by Orthopedic Surgeon, Dr. Jonathon Bellows. Wound type:Infectious, neuropathic Pressure Injury POA: N/A Measurement:To be obtained by bedside RN prior to the application of the first dressing and documented on the Nursing Flow Sheet. Wound bed:red, moist Drainage (amount, consistency, odor) serous, moderate amount Periwound:Erythematous, edematous Dressing procedure/placement/frequency: I have provided guidance for the Nursing staff for topical wound care (NS dampened gauze followed by dry gauze and secured with conform bandaging) applied after washing foot with soap and water. Protection of the foot and elevation of the heel to be provided with use of a Prevalon boot.  Recommend Dr. Lajoyce Corners be consulted and notified of this patient's admission in the event that a surgical procedure is indicated. If you agree, please order/arrange.  WOC nursing team will not follow, but will remain available to this patient, the nursing and medical teams.  Please re-consult if needed. Thanks, Ladona Mow, MSN, RN, GNP, [place]  Pager# [phone]

## 2020-03-03 NOTE — Progress Notes (Addendum)
Subjective: Patient in bed this AM. He reports that his mood oscillates " with the hour." He currently reports feeling okay. He does not report significant pain with his toes and reports that he was more concerned by his subjective fevers prior to presenting to ED. He describes the fevers as "enough to knock an elephant over."  Endorses hx of depression and has recently been too depressed to refill his medications. He describes feeling that it has been too difficult to get out of bed. Patient does report that he took Zoloft and Adderall at one time "many years ago"and that he did feel better with the Zoloft. He also may have had counselor/therapist/psychiatrist in his 20's he is unsure of which one he saw.  Patient reports hx of Bipolar disease but when asked he reports he has never been officially diagnosed. Upon further review patient has never had manic episode.   Patient reports that he had hx of ADHD. Objective:  Vital signs in last 24 hours: Vitals:   03/03/20 0730 03/03/20 0815 03/03/20 0830 03/03/20 0845  BP: 112/67 127/74 114/75 134/80  Pulse: 94 99 94 92  Resp: (!) 37 19 (!) 25 (!) 24  Temp:      TempSrc:      SpO2: 98% 98% 98% 98%  Weight:      Height:       Physical Exam HENT:     Head: Normocephalic and atraumatic.  Cardiovascular:     Rate and Rhythm: Regular rhythm. Tachycardia present.  Pulmonary:     Effort: Pulmonary effort is normal.     Breath sounds: Normal breath sounds.  Abdominal:     General: Bowel sounds are normal.     Palpations: Abdomen is soft.     Tenderness: There is no abdominal tenderness.  Musculoskeletal:     Comments: Distal pulses are intact in BLE  Skin:    Comments: R and L great to are swollen and erythematous.   L toe had circumferential ulceration on the ventral aspect of the great toe, approx 1cm x 1cm around. Ulcer is draining purulent fluid without expression of the wound. Decreased sensation of the ventral and dorsal aspect of  the great toe.  R toe as a linear ulcer that is like 2.5 cm long on the ventral aspect of the toe and is also draining purulent fluids freely. Decreased sensation of the ventral aspect of the toe.  Neurological:     Mental Status: He is alert.  Psychiatric:        Attention and Perception: Attention and perception normal. He does not perceive auditory or visual hallucinations.        Mood and Affect: Mood normal.        Speech: Speech normal.        Behavior: Behavior is cooperative.        Thought Content: Thought content does not include homicidal or suicidal plan.        Cognition and Memory: Cognition normal. He does not exhibit impaired remote memory.     Comments: May have passive suicidality. Does not endorse Active SI at this time.     Assessment/Plan:  Active Problems:   Cellulitis of left toe   Diabetic ketoacidosis (HCC)   DKA (diabetic ketoacidosis) (HCC)  # Sepsis in setting of LLE Cellulitis due to Infected Diabetic Foot Wound  Ortho has been consulted and on call physician will pass along to Dr. Lajoyce Corners who will see the patient tom AM. At  this time patient continues to purulent drainage from both ulcers of the R and L toe. Patient has remained afebrile over the past 24 h. LA 2.3>2.3>1.6.Leukocytosis is improving with abx. WBC 18.5>14.4. At this time vitals are stable and patient has remained afebrile over the past 24h with mild tachypnea. MRI Left foot noted probable draining sinus tract associated with ulcer and possible early osteomyelitis in the great toe. MRI R foot also noted signs suspicious for osteomyelitis in the great to.  - Continue Vancomycin, dosed per pharmacy - Continue Cefepime 2g q8 hrs for pseudomonal coverage - Start metronidazole 500mg  q 8h - Blood cultures pending - Wound Care Consulted  - Check morning CBC, CMP, Lactic Acid - Check HIV antibodies   # Hyperglycemia w/ uncontrolled T2DM presented with Mild DKA  Patient continue to have tachypnea.  Labs: Hgb A1c 11.8.Glucose 369>366>311>281>242>232>238>244, bicarbonate 17>>>22, anion gap 18>15>10>10, K+ this AM is 3.5. Anion gap 18 and LA 2.3>2.3>1.6. Likely due to lactic acidosis in the setting of sepsis, also with mild DKA. BHB 3.94>1.14. Patient AG clsoed x2 will transition off Endotool and provide carb modified diet. Will discontinue fluids and IV insulin at this time.  - KCl replacement as needed - DM coordinator consulted will follow recommendations:   Lantus 40 daily   SAI 0-15 TID w/ SAI 0-5 U qh w/ moderate Sliding scale   No meal time correction - Carb modified diet - Tylenol 650 q6hrs PRN for pain    # Mood disorder  concern for Passive vs Active SI at presentation Patient notes a long history of depression. Patient does not endorse SI this AM, but reports that he often contemplates suicide and death and his SI heavily relies on his labile mood. Patient reported that his mood "changes with the hour" and he is often "depressed." He reports a history of success on zoloft but discontinued taking after 4 months when he lost his insurance. Patient psychiatric history heavily impacts his physical health as he is often too depressed to get his medications or take care of himself daily. 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 Psychiatry has been consulted however, patient cannot be seen by inpatient consults until he gets a bed outside of the ED. Patient has been admitted and is stable at this time and does not require ED Psych consult. Will consider restarting patient on Zoloft and will reach out to SW closer to D'c to get patient set up with [facility] as he is uninsured [facility] resident. - Patient will require outpatient psychiatry referral - Please continue code status discussions - TOC consulted for PCP needs - consult psych again when patient has bed   # Acute Kidney  Injury- resolved BUN/Cr 11/0.68. AKI has resolved with fluids.    - Avoid nephrotoxic agents    # Hyperbilirubinemia- resolved Total bilirubin 1.4>0.8. Has improved with DKA tx and continued abx. - check morning CMP   # Hypertension Blood pressure well controlled currently not on antihypertensives. Patient had been prescribed lisinopril 20mg  outpatient but had not been taking this. BP this AM remains normotensive 127/74. - Will hold home lisinopril 20mg  daily given AKI - Continue to monitor    # Hyperlipidemia  Chart review notes a history of hyperlipidemia requiring Atorvastatin 20mg  daily, although patient denies known history or statin use.  - Follow up outpatient      Dispo: Admit patient to  Inpatient with expected length of stay greater than 2 midnights.    Diet: Carb modified DVT PPx: Heparin  Code status: Partial - Intubation, no CPR    Prior to Admission Living Arrangement: Home Anticipated Discharge Location: Home Barriers to Discharge: Treatment Dispo: Anticipated discharge in approximately 1-2 day(s).   Bobbye Morton, MD 03/03/2020, 10:58 AM Pager: [phone] After 5pm on weekdays and 1pm on weekends: On Call pager [phone]

## 2020-03-03 NOTE — ED Notes (Signed)
Spoke with [name], Resident about pt removed from Insulin drip at 1430. Pt to be changed from progressive to telemetry.

## 2020-03-04 ENCOUNTER — Other Ambulatory Visit: Payer: Self-pay | Admitting: Physician Assistant

## 2020-03-04 DIAGNOSIS — R6521 Severe sepsis with septic shock: Secondary | ICD-10-CM

## 2020-03-04 DIAGNOSIS — E1165 Type 2 diabetes mellitus with hyperglycemia: Secondary | ICD-10-CM

## 2020-03-04 DIAGNOSIS — A419 Sepsis, unspecified organism: Secondary | ICD-10-CM

## 2020-03-04 DIAGNOSIS — L03032 Cellulitis of left toe: Secondary | ICD-10-CM

## 2020-03-04 DIAGNOSIS — A401 Sepsis due to streptococcus, group B: Principal | ICD-10-CM

## 2020-03-04 DIAGNOSIS — R6520 Severe sepsis without septic shock: Secondary | ICD-10-CM

## 2020-03-04 DIAGNOSIS — M869 Osteomyelitis, unspecified: Secondary | ICD-10-CM

## 2020-03-04 DIAGNOSIS — E1010 Type 1 diabetes mellitus with ketoacidosis without coma: Secondary | ICD-10-CM

## 2020-03-04 DIAGNOSIS — F331 Major depressive disorder, recurrent, moderate: Secondary | ICD-10-CM | POA: Diagnosis present

## 2020-03-04 DIAGNOSIS — N179 Acute kidney failure, unspecified: Secondary | ICD-10-CM

## 2020-03-04 DIAGNOSIS — L02612 Cutaneous abscess of left foot: Secondary | ICD-10-CM

## 2020-03-04 LAB — CBC WITH DIFFERENTIAL/PLATELET
Abs Immature Granulocytes: 0.06 10*3/uL (ref 0.00–0.07)
Basophils Absolute: 0.1 10*3/uL (ref 0.0–0.1)
Basophils Relative: 1 %
Eosinophils Absolute: 0.1 10*3/uL (ref 0.0–0.5)
Eosinophils Relative: 1 %
HCT: 38.5 % — ABNORMAL LOW (ref 39.0–52.0)
Hemoglobin: 12.6 g/dL — ABNORMAL LOW (ref 13.0–17.0)
Immature Granulocytes: 1 %
Lymphocytes Relative: 15 %
Lymphs Abs: 2 10*3/uL (ref 0.7–4.0)
MCH: 27.5 pg (ref 26.0–34.0)
MCHC: 32.7 g/dL (ref 30.0–36.0)
MCV: 83.9 fL (ref 80.0–100.0)
Monocytes Absolute: 0.9 10*3/uL (ref 0.1–1.0)
Monocytes Relative: 7 %
Neutro Abs: 9.9 10*3/uL — ABNORMAL HIGH (ref 1.7–7.7)
Neutrophils Relative %: 75 %
Platelets: 299 10*3/uL (ref 150–400)
RBC: 4.59 MIL/uL (ref 4.22–5.81)
RDW: 14 % (ref 11.5–15.5)
WBC: 12.9 10*3/uL — ABNORMAL HIGH (ref 4.0–10.5)
nRBC: 0 % (ref 0.0–0.2)

## 2020-03-04 LAB — BASIC METABOLIC PANEL
Anion gap: 12 (ref 5–15)
BUN: 9 mg/dL (ref 6–20)
CO2: 20 mmol/L — ABNORMAL LOW (ref 22–32)
Calcium: 8.4 mg/dL — ABNORMAL LOW (ref 8.9–10.3)
Chloride: 102 mmol/L (ref 98–111)
Creatinine, Ser: 0.62 mg/dL (ref 0.61–1.24)
GFR, Estimated: >60 mL/min (ref >60)
Glucose, Bld: 241 mg/dL — ABNORMAL HIGH (ref 70–99)
Potassium: 3.7 mmol/L (ref 3.5–5.1)
Sodium: 134 mmol/L — ABNORMAL LOW (ref 135–145)

## 2020-03-04 LAB — GLUCOSE, CAPILLARY
Glucose-Capillary: 228 mg/dL — ABNORMAL HIGH (ref 70–99)
Glucose-Capillary: 244 mg/dL — ABNORMAL HIGH (ref 70–99)
Glucose-Capillary: 211 mg/dL — ABNORMAL HIGH (ref 70–99)

## 2020-03-04 MED ORDER — INSULIN GLARGINE 100 UNIT/ML ~~LOC~~ SOLN
45.0000 [IU] | Freq: Every day | SUBCUTANEOUS | Status: DC
  Administered 2020-03-05 – 2020-03-08 (×4): 45 [IU] via SUBCUTANEOUS
  Filled 2020-03-04 (×4): qty 0.45

## 2020-03-04 MED ORDER — PENICILLIN G POTASSIUM 20000000 UNITS IJ SOLR
12.0000 10*6.[IU] | Freq: Two times a day (BID) | INTRAVENOUS | Status: DC
  Administered 2020-03-04 – 2020-03-05 (×3): 12 10*6.[IU] via INTRAVENOUS
  Filled 2020-03-04 (×7): qty 12

## 2020-03-04 NOTE — Consult Note (Signed)
[facility]    Date of Admission:  03/02/2020      Total days of antibiotics 2   Vancomycin, Cefepime, Flagyl          Reason for Consult: Chronic Toe Wound     Referring Provider: Dareen Piano  Primary Care Provider: Horald Pollen, MD    Assessment: [name] is a 39 y.o. diabetic male with chronic bilateral great toe wounds that initially started in January 2020 following injury to the skin/blisters from work shoes. He has been working to heal these with Dr. Jess Barters team outpatient and was doing pretty well up until a few months ago when he stopped his diabetes medications. The few days leading up to his admission he noticed escalation of fevers/chills, swelling and purulent drainage from both toes; significantly more from the left. He had a secondary bacteremia due to group b streptococcus at admission as well as mild DKA. He is now planning amputation of left great toe this week with Dr. Sharol Given. Given other remaining toe salvage attempt would probably be more aggressive here and treat 4-6 weeks IV antibiotics to start. Would narrow to IV penicillin to target the group b strep. Discussed briefly home antibiotic course with PICC line. He lives with his mother and father so feels that will be doable.   Will check ESR / CRP in AM and follow surgery findings. Will follow for any cultures that may be obtained from procedure and intraop findings.    Plan: 1. Narrow antimicrobials to Penicillin alone  2. ESR / CRP in AM 3. I think we can defer repeating blood cultures as long as he continues to improve - will D/W Dr. Tommy Medal however since we are planning a PICC line.  4. Follow operative report / micro if any is collected     Active Problems:   Cellulitis of left toe   Diabetic ketoacidosis ([place])   DKA (diabetic ketoacidosis) ([place])   Sepsis with acute renal failure without septic shock ([facility])   Abscess of great toe, left   . heparin   5,000 Units Subcutaneous Q8H  . insulin aspart  0-15 Units Subcutaneous TID WC  . insulin aspart  0-5 Units Subcutaneous QHS  . [START ON 03/05/2020] insulin glargine  45 Units Subcutaneous Daily    HPI: [name] is a 39 y.o. male admitted for evaluation of worsening wounds to bilateral great toes L>R.   He states that he has been working on these wounds since January 2020 and has been working with Dr. Jess Barters orthopedic team. They have been very slow to heal outpatient but had not had any purulent drainage until the "blew up recently." Had been on doxycycline in the past per chart notes (he is not sure what antibiotics he has been taking.  No antibiotic allergies or trouble with side effects. Had nausea one last night with some of the antibiotics he had received.  He does have sensation in feet; reports the pain in the left great toe is throbbing badly now. Very swollen. No drainage from the right great toe ulcer presently and states this has reduced in size already with current antibiotics. He has also apparently been off his diabetes medications the last few months and no follow up with primary care provider. Has had 35# weight loss recently.    Hospital Course:  Found to be in DKA and with secondary bacteremia due to group b streptococcus. Some  fevers/chills outpatient. Leukocytosis present on admission 14.4K. His lactic acidosis has resolved and quick closure of anion gap with IVF and insulin gtt. 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 Sharol Given felt this was more reactive as there was no abscess or tracking wound from ulcer.    Review of Systems: Review of Systems  Constitutional: Positive for chills, fever and weight loss.  Respiratory: Negative for cough and shortness of breath.   Cardiovascular: Positive for leg swelling. Negative for chest pain.  Gastrointestinal: Positive for nausea  (with doxycycline). Negative for abdominal pain and vomiting.  Genitourinary: Negative for dysuria.  Musculoskeletal: Negative for myalgias.  Skin: Negative for rash.  Neurological: Negative for dizziness.    Past Medical History:  Diagnosis Date  . Diabetes mellitus without complication ([place])   . Hypertension     Social History   Tobacco Use  . Smoking status: Never Smoker  . Smokeless tobacco: Never Used  Vaping Use  . Vaping Use: Never used  Substance Use Topics  . Alcohol use: No    Alcohol/week: 0.0 standard drinks  . Drug use: No    Family History  Problem Relation Age of Onset  . Diabetes Mother   . Hyperlipidemia Father   . Hypertension Father    Allergies  Allergen Reactions  . Milk-Related Compounds Diarrhea  . Iodine     Patient endorses unspecified reaction to iodine in past    OBJECTIVE: Blood pressure 125/71, pulse 90, temperature 97.7 F (36.5 C), temperature source Oral, resp. rate (!) 30, height '6\' 1"'  (1.854 m), weight (!) 148.4 kg, SpO2 98 %.  Physical Exam Vitals reviewed.  Constitutional:      Appearance: He is obese.     Comments: Sitting up on side of bed tending to feet.   HENT:     Mouth/Throat:     Mouth: Mucous membranes are moist.     Pharynx: Oropharynx is clear.  Eyes:     General: No scleral icterus.    Pupils: Pupils are equal, round, and reactive to light.  Cardiovascular:     Rate and Rhythm: Normal rate and regular rhythm.  Pulmonary:     Effort: Pulmonary effort is normal.     Breath sounds: Normal breath sounds.  Skin:    General: Skin is warm and dry.     Capillary Refill: Capillary refill takes less than 2 seconds.     Comments: Has some circumferential erythematous hyperpigmentation to the right foot up to mid shin c/w venous stasis.  R great toe is quite large and swollen, purple and erythematous. Some open areas leaking clear fluid mostly.  L great toe still large but significantly smaller in size with open  ulceration to plantar aspect.   Neurological:     Mental Status: He is alert and oriented to person, place, and time.     Lab Results Lab Results  Component Value Date   WBC 12.9 (H) 03/04/2020   HGB 12.6 (L) 03/04/2020   HCT 38.5 (L) 03/04/2020   MCV 83.9 03/04/2020   PLT 299 03/04/2020    Lab Results  Component Value Date   CREATININE 0.62 03/04/2020   BUN 9 03/04/2020   NA 134 (L) 03/04/2020   K 3.7 03/04/2020   CL 102 03/04/2020   CO2 20 (L) 03/04/2020    Lab Results  Component Value Date   ALT 11 03/03/2020  AST 13 (L) 03/03/2020   ALKPHOS 66 03/03/2020   BILITOT 0.8 03/03/2020     Microbiology: Recent Results (from the past 240 hour(s))  Culture, blood (Routine x 2)     Status: Abnormal (Preliminary result)   Collection Time: 03/02/20  6:54 PM   Specimen: BLOOD  Result Value Ref Range Status   Specimen Description BLOOD LEFT ANTECUBITAL  Final   Special Requests   Final    BOTTLES DRAWN AEROBIC AND ANAEROBIC Blood Culture adequate volume   Culture  Setup Time   Final    GRAM POSITIVE COCCI IN CHAINS AEROBIC BOTTLE ONLY CRITICAL VALUE NOTED.  VALUE IS CONSISTENT WITH PREVIOUSLY REPORTED AND CALLED VALUE. Performed at [facility], [place] [address], [place], [place] [address]    Culture GROUP B STREP(S.AGALACTIAE)ISOLATED (A)  Final   Report Status PENDING  Incomplete  Culture, blood (Routine x 2)     Status: Abnormal (Preliminary result)   Collection Time: 03/02/20  8:10 PM   Specimen: BLOOD  Result Value Ref Range Status   Specimen Description BLOOD SITE NOT SPECIFIED  Final   Special Requests   Final    BOTTLES DRAWN AEROBIC AND ANAEROBIC Blood Culture results may not be optimal due to an inadequate volume of blood received in culture bottles   Culture  Setup Time   Final    GRAM POSITIVE COCCI IN CHAINS ANAEROBIC BOTTLE ONLY Organism ID to follow CRITICAL RESULT CALLED TO, READ BACK BY AND VERIFIED WITH: PHARMD C AMEND [id number] MLM     Culture (A)  Final    GROUP B STREP(S.AGALACTIAE)ISOLATED SUSCEPTIBILITIES TO FOLLOW Performed at [facility] [address], [place],  [address]    Report Status PENDING  Incomplete  Blood Culture ID Panel (Reflexed)     Status: Abnormal   Collection Time: 03/02/20  8:10 PM  Result Value Ref Range Status   Enterococcus faecalis NOT DETECTED NOT DETECTED Final   Enterococcus Faecium NOT DETECTED NOT DETECTED Final   Listeria monocytogenes NOT DETECTED NOT DETECTED Final   Staphylococcus species NOT DETECTED NOT DETECTED Final   Staphylococcus aureus (BCID) NOT DETECTED NOT DETECTED Final   Staphylococcus epidermidis NOT DETECTED NOT DETECTED Final   Staphylococcus lugdunensis NOT DETECTED NOT DETECTED Final   Streptococcus species DETECTED (A) NOT DETECTED Final    Comment: CRITICAL RESULT CALLED TO, READ BACK BY AND VERIFIED WITH: PHARMD C AMEND [phone] MLM    Streptococcus agalactiae DETECTED (A) NOT DETECTED Final    Comment: CRITICAL RESULT CALLED TO, READ BACK BY AND VERIFIED WITH: PHARMD C AMEND [id number] MLM    Streptococcus pneumoniae NOT DETECTED NOT DETECTED Final   Streptococcus pyogenes NOT DETECTED NOT DETECTED Final   A.calcoaceticus-baumannii NOT DETECTED NOT DETECTED Final   Bacteroides fragilis NOT DETECTED NOT DETECTED Final   Enterobacterales NOT DETECTED NOT DETECTED Final   Enterobacter cloacae complex NOT DETECTED NOT DETECTED Final   Escherichia coli NOT DETECTED NOT DETECTED Final   Klebsiella aerogenes NOT DETECTED NOT DETECTED Final   Klebsiella oxytoca NOT DETECTED NOT DETECTED Final   Klebsiella pneumoniae NOT DETECTED NOT DETECTED Final   Proteus species NOT DETECTED NOT DETECTED Final   Salmonella species NOT DETECTED NOT DETECTED Final   Serratia marcescens NOT DETECTED NOT DETECTED Final   Haemophilus influenzae NOT DETECTED NOT DETECTED Final   Neisseria meningitidis NOT DETECTED NOT DETECTED Final   Pseudomonas aeruginosa  NOT DETECTED NOT DETECTED Final   Stenotrophomonas maltophilia  NOT DETECTED NOT DETECTED Final   Candida albicans NOT DETECTED NOT DETECTED Final   Candida auris NOT DETECTED NOT DETECTED Final   Candida glabrata NOT DETECTED NOT DETECTED Final   Candida krusei NOT DETECTED NOT DETECTED Final   Candida parapsilosis NOT DETECTED NOT DETECTED Final   Candida tropicalis NOT DETECTED NOT DETECTED Final   Cryptococcus neoformans/gattii NOT DETECTED NOT DETECTED Final    Comment: Performed at [facility], [place] [address] [place], [place] [address]  Respiratory Panel by RT PCR (Flu A&B, Covid) - Nasopharyngeal Swab     Status: None   Collection Time: 03/02/20  9:09 PM   Specimen: Nasopharyngeal Swab  Result Value Ref Range Status   SARS Coronavirus 2 by RT PCR NEGATIVE NEGATIVE Final    Comment: (NOTE) SARS-CoV-2 target nucleic acids are NOT DETECTED.  The SARS-CoV-2 RNA is generally detectable in upper respiratoy specimens during the acute phase of infection. The lowest concentration of SARS-CoV-2 viral copies this assay can detect is 131 copies/mL. A negative result does not preclude SARS-Cov-2 infection and should not be used as the sole basis for treatment or other patient management decisions. A negative result may occur with  improper specimen collection/handling, submission of specimen other than nasopharyngeal swab, presence of viral mutation(s) within the areas targeted by this assay, and inadequate number of viral copies (<131 copies/mL). A negative result must be combined with clinical observations, patient history, and epidemiological information. The expected result is Negative.  Fact Sheet for Patients:  PinkCheek.be  Fact Sheet for Healthcare Providers:  GravelBags.it  This test is no t yet approved or cleared by the [place] FDA and  has been authorized for detection and/or diagnosis of SARS-CoV-2  by FDA under an Emergency Use Authorization (EUA). This EUA will remain  in effect (meaning this test can be used) for the duration of the COVID-19 declaration under Section 564(b)(1) of the Act, 21 U.S.C. section 360bbb-3(b)(1), unless the authorization is terminated or revoked sooner.     Influenza A by PCR NEGATIVE NEGATIVE Final   Influenza B by PCR NEGATIVE NEGATIVE Final    Comment: (NOTE) The Xpert Xpress SARS-CoV-2/FLU/RSV assay is intended as an aid in  the diagnosis of influenza from Nasopharyngeal swab specimens and  should not be used as a sole basis for treatment. Nasal washings and  aspirates are unacceptable for Xpert Xpress SARS-CoV-2/FLU/RSV  testing.  Fact Sheet for Patients: PinkCheek.be  Fact Sheet for Healthcare Providers: GravelBags.it  This test is not yet approved or cleared by the [place] FDA and  has been authorized for detection and/or diagnosis of SARS-CoV-2 by  FDA under an Emergency Use Authorization (EUA). This EUA will remain  in effect (meaning this test can be used) for the duration of the  Covid-19 declaration under Section 564(b)(1) of the Act, 21  U.S.C. section 360bbb-3(b)(1), unless the authorization is  terminated or revoked. Performed at [facility], [place] [address], [place], [place] [address]   MRSA PCR Screening     Status: None   Collection Time: 03/03/20  7:00 PM   Specimen: Nasopharyngeal  Result Value Ref Range Status   MRSA by PCR NEGATIVE NEGATIVE Final    Comment:        The GeneXpert MRSA Assay (FDA approved for NASAL specimens only), is one component of a comprehensive MRSA colonization surveillance program. It is not intended to diagnose MRSA infection nor to guide or monitor treatment for MRSA infections. Performed at  [facility], [place] [address], [place], [place] [address]     Janene Madeira, MSN, NP-C [facility] [email or web address] Pager: [phone] Office: [phone] [place]: [phone]

## 2020-03-04 NOTE — H&P (View-Only) (Signed)
ORTHOPAEDIC CONSULTATION  REQUESTING PHYSICIAN: Earl Lagos, MD  Chief Complaint: Increasing cellulitis and new abscess left great toe.  HPI: [name] is a 39 y.o. male who presents with uncontrolled type 2 diabetes patient states that the left great toe has started swelling more now with purulent drainage after starting his oral doxycycline.  Past Medical History:  Diagnosis Date  . Diabetes mellitus without complication (HCC)   . Hypertension    History reviewed. No pertinent surgical history. Social History   Socioeconomic History  . Marital status: Single    Spouse name: Not on file  . Number of children: Not on file  . Years of education: Not on file  . Highest education level: Not on file  Occupational History  . Occupation: Unemployed  Tobacco Use  . Smoking status: Never Smoker  . Smokeless tobacco: Never Used  Vaping Use  . Vaping Use: Never used  Substance and Sexual Activity  . Alcohol use: No    Alcohol/week: 0.0 standard drinks  . Drug use: No  . Sexual activity: Not on file  Other Topics Concern  . Not on file  Social History Narrative   Live with parents in [place].    Social Determinants of Health   Financial Resource Strain:   . Difficulty of Paying Living Expenses: Not on file  Food Insecurity:   . Worried About Programme researcher, broadcasting/film/video in the Last Year: Not on file  . Ran Out of Food in the Last Year: Not on file  Transportation Needs:   . Lack of Transportation (Medical): Not on file  . Lack of Transportation (Non-Medical): Not on file  Physical Activity:   . Days of Exercise per Week: Not on file  . Minutes of Exercise per Session: Not on file  Stress:   . Feeling of Stress : Not on file  Social Connections:   . Frequency of Communication with Friends and Family: Not on file  . Frequency of Social Gatherings with Friends and Family: Not on file  . Attends Religious Services: Not on file  . Active Member of Clubs or  Organizations: Not on file  . Attends Banker Meetings: Not on file  . Marital Status: Not on file   Family History  Problem Relation Age of Onset  . Diabetes Mother   . Hyperlipidemia Father   . Hypertension Father    - negative except otherwise stated in the family history section Allergies  Allergen Reactions  . Milk-Related Compounds Diarrhea  . Iodine     Patient endorses unspecified reaction to iodine in past   Prior to Admission medications   Medication Sig Start Date End Date Taking? Authorizing Provider  acetaminophen (TYLENOL) 500 MG tablet Take 1,000 mg by mouth daily as needed for headache.   Yes [provider]  doxycycline (VIBRA-TABS) 100 MG tablet Take 1 tablet (100 mg total) by mouth 2 (two) times daily. 02/27/20  Yes Persons, [place], [place]  OVER THE COUNTER MEDICATION Apply 1 application topically in the morning and at bedtime. Athlete's foot cream - for jock itch   Yes [provider]  atorvastatin (LIPITOR) 20 MG tablet Take 1 tablet (20 mg total) by mouth daily. Patient not taking: Reported on 03/02/2020 10/19/18   [name], MD  glipiZIDE (GLUCOTROL) 5 MG tablet Take 1 tablet (5 mg total) by mouth 2 (two) times daily before a meal. Patient not taking: Reported on 03/02/2020 10/19/18 03/29/19  [name]  [name], MD  lisinopril (ZESTRIL) 20 MG tablet Take 1 tablet (20 mg total) by mouth daily. Patient not taking: Reported on 03/02/2020 10/19/18   Sagardia, [name], MD  metFORMIN (GLUCOPHAGE) 1000 MG tablet Take 1 tablet (1,000 mg total) by mouth 2 (two) times daily with a meal. Patient not taking: Reported on 03/02/2020 10/19/18   Sagardia, [name], MD  nitroGLYCERIN (NITRODUR - DOSED IN MG/24 HR) 0.2 mg/hr patch Place 1 patch (0.2 mg total) onto the skin daily. Patient not taking: Reported on 03/02/2020 07/11/19   Zamora, [name], NP   DG Chest 2 View  Result Date: 03/02/2020 CLINICAL DATA:  Suspected sepsis EXAM: CHEST  - 2 VIEW COMPARISON:  None. FINDINGS: No consolidation, features of edema, pneumothorax, or effusion. Pulmonary vascularity is normally distributed. The cardiomediastinal contours are unremarkable. No acute osseous or soft tissue abnormality. Mild degenerative changes in the shoulders and spine. IMPRESSION: No acute cardiopulmonary abnormality. Electronically Signed   By: Price  DeHay M.D.   On: 03/02/2020 19:09   MR FOOT RIGHT WO CONTRAST  Result Date: 03/03/2020 CLINICAL DATA:  Bilateral great toe wounds. Open draining wound on the right. Diabetes. Clinical concern for osteomyelitis. EXAM: MRI OF THE RIGHT FOREFOOT WITHOUT CONTRAST TECHNIQUE: Multiplanar, multisequence MR imaging of the right forefoot was performed. No intravenous contrast was administered. COMPARISON:  Radiographs 02/27/2020 and 07/30/2017 FINDINGS: Bones/Joint/Cartilage Prominent T2 marrow hyperintensity medially in the base of the distal 1st phalanx, adjacent to skin ulceration described below, suspicious for early osteomyelitis. No cortical destruction or focal T1 marrow signal abnormality. No significant interphalangeal joint effusion. No abnormal signal within the 1st proximal phalanx. The additional toes and metatarsals appear normal. Mild degenerative changes at the 1st metatarsophalangeal joint and it sesamoids associated with a small joint effusion. Normal alignment at the Lisfranc joint. Ligaments Intact Lisfranc ligament. The collateral ligaments of the metatarsophalangeal joints are intact. Muscles and Tendons Intact flexor and extensor tendons without tenosynovitis. 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 Soft tissues There is focal skin ulceration plantar to the interphalangeal joint with surrounding soft tissue swelling. Possible small sinus tract extending to the distal 1st phalangeal base, best seen on the axial images. No focal fluid collection, foreign body or definite soft tissue emphysema.  IMPRESSION: 1. Focal skin ulceration plantar to the interphalangeal joint with surrounding soft tissue swelling and possible small sinus tract extending to the distal 1st phalangeal base. No focal fluid collection, foreign body or definite soft tissue emphysema. 2. Prominent T2 marrow hyperintensity medially in the base of the distal 1st phalanx, suspicious for early osteomyelitis. No cortical destruction or focal T1 marrow signal abnormality. 3. Mild degenerative changes at the 1st metatarsophalangeal joint and it sesamoids associated with a small joint effusion. Electronically Signed   By: William  Veazey M.D.   On: 03/03/2020 10:37   MR FOOT LEFT WO CONTRAST  Result Date: 03/03/2020 CLINICAL DATA:  Bilateral foot swelling. Prescribed antibiotics. No history of diabetes. Osteomyelitis suspected. EXAM: MRI OF THE LEFT FOOT WITHOUT CONTRAST TECHNIQUE: Multiplanar, multisequence MR imaging of the left forefoot was performed. No intravenous contrast was administered. COMPARISON:  Office radiographs 02/27/2020 and 12/12/2019. FINDINGS: Bones/Joint/Cartilage There is mild marrow T2 hyperintensity within the distal 1st phalanx. No cortical destruction or T1 signal abnormality is identified. There is no abnormal marrow signal within the proximal 1st phalanx, additional digits or visualized metatarsals. There are underlying mild degenerative changes at the 1st metatarsophalangeal and interphalangeal joints with associated small joint effusions. 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 Ligaments Intact Lisfranc ligament. The collateral ligaments of the metatarsophalangeal joints are intact. Muscles and Tendons The forefoot tendons are intact without significant tenosynovitis. There is mild nonspecific T2 hyperintensity within the forefoot musculature. No focal fluid collection. Soft tissues Soft  tissue swelling and edema throughout the great toe with focal soft tissue ulceration plantar to the interphalangeal joint. Probable small associated sinus tract extending to the periosteal surface of the distal 1st phalanx, best seen on image 25/5. No focal fluid collections are identified. In correlation with recent radiographs, there is probable associated soft tissue emphysema in the great toe. No evidence of foreign body. IMPRESSION: 1. Soft tissue swelling and edema throughout the great toe with focal soft tissue ulceration plantar to the interphalangeal joint. Probable small associated sinus tract extending to the periosteal surface of the distal 1st phalanx. No focal fluid collection identified. 2. Mild marrow T2 hyperintensity within the distal 1st phalanx without cortical destruction or T1 signal abnormality. This finding is nonspecific and could be secondary to hyperemia, although suspicious for early osteomyelitis given proximity to the skin ulceration and suspected sinus tract described above. 3. Mild degenerative changes at the 1st metatarsophalangeal and interphalangeal joints. 4. Preliminary report of these findings was discussed with Dr. Preston Fleeting Dr. Elvera Maria at 11:31 p.m. on 03/02/2020. Electronically Signed   By: Carey Bullocks M.D.   On: 03/03/2020 09:01   - pertinent xrays, CT, MRI studies were reviewed and independently interpreted  Positive ROS: All other systems have been reviewed and were otherwise negative with the exception of those mentioned in the HPI and as above.  Physical Exam: General: Alert, no acute distress Psychiatric: Patient is competent for consent with normal mood and affect Lymphatic: No axillary or cervical lymphadenopathy Cardiovascular: No pedal edema Respiratory: No cyanosis, no use of accessory musculature GI: No organomegaly, abdomen is soft and non-tender    Images:  @ENCIMAGES @  Labs:  Lab Results  Component Value Date   HGBA1C 11.8 (H) 03/03/2020     HGBA1C 11.4 (A) 05/02/2018   HGBA1C 11.3 (A) 01/25/2018   REPTSTATUS PENDING 03/02/2020   CULT GRAM POSITIVE COCCI 03/02/2020    Lab Results  Component Value Date   ALBUMIN 2.5 (L) 03/03/2020   ALBUMIN 3.1 (L) 03/02/2020   ALBUMIN 3.6 04/04/2019    Neurologic: Patient does not have protective sensation bilateral lower extremities.   MUSCULOSKELETAL:   Skin: Examination patient has increased swelling and cellulitis of the left great toe there is purulent drainage dorsally at the IP joint.  Patient has a strong dorsalis pedis pulse there is no ascending cellulitis proximal to the MTP joint.  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  Review of the MRI scan of the right great toe shows some edema of the medial aspect of the tuft of the right great toe this does not communicate with the ulcer and appears to be more of a stress reaction due to the Achilles contracture and hallux rigidus.  The MRI scan of the left great toe does not show the draining abscess and does not show osteomyelitis of the left great toe.  Patient  has a hemoglobin A1c of 11.8 and albumin of 2.5.  Assessment: Assessment: Uncontrolled type 2 diabetes with new abscess left great toe.  Plan: Plan: We will plan for amputation left great toe at the MTP joint on Wednesday.  The MRI scan does show some edema in the medial aspect of the tuft of the right great toe however the right great toe continues to look better we will follow this expectantly.  Thank you for the consult and the opportunity to see Mr. Jonathan Corpus, MD [facility] [phone] 8:14 AM

## 2020-03-04 NOTE — Progress Notes (Addendum)
Inpatient Diabetes Program Recommendations  AACE/ADA: New Consensus Statement on Inpatient Glycemic Control (2015)  Target Ranges:  Prepandial:   less than 140 mg/dL      Peak postprandial:   less than 180 mg/dL (1-2 hours)      Critically ill patients:  140 - 180 mg/dL   Lab Results  Component Value Date   GLUCAP 228 (H) 03/04/2020   HGBA1C 11.8 (H) 03/03/2020    Review of Glycemic Control Results for Tool, Dorris C (MRN [id number]) as of 03/04/2020 08:22  Ref. Range 03/03/2020 22:35 03/04/2020 06:03  Glucose-Capillary Latest Ref Range: 70 - 99 mg/dL 265 (H) 228 (H)   Diabetes history: DM 2 Outpatient Diabetes medications: Glipizide and metformin in the past Current orders for Inpatient glycemic control: IV insulin  A1c 11.8% this admission  Inpatient Diabetes Program Recommendations:    Consider increasing Lantus to 45 units QD.   Addendum: Spoke with patient regarding outpatient diabetes management. Patient reports not taking oral medications related to depression.  Reviewed patient's current A1c of 11.8%. Explained what a A1c is and what it measures. Also reviewed goal A1c with patient, importance of good glucose control @ home, and blood sugar goals. Reviewed patho of DM, need for insulin, role of pancreas, DKA, impact of glucose trends and cellulitis, vascular changes and commorbidities.  Patient will need a meter. Reviewed recommended blood sugar checks atleast 2 times per day. Reviewed [employer] information and will attach to DC summary. Patient able to afford. Patient admits to drinking multiple sodas each day and reports that he is willing to switch to water. Encouraged plate method and carbohydrate mindfulness. Will place consult for dietitian and case management to ensure follow up.  Educated patient on insulin pen use at home. Reviewed contents of insulin flexpen starter kit. Reviewed all steps if insulin pen including attachment of needle, 2-unit air shot, dialing  up dose, giving injection, removing needle, disposal of sharps, storage of unused insulin, disposal of insulin etc. Patient able to provide successful return demonstration. Also reviewed troubleshooting with insulin pen. MD to give patient Rxs for insulin pens and insulin pen needles.    Thanks, Lauren McDaniel, MSN, RNC-OB Diabetes Coordinator [phone] (8a-5p)  

## 2020-03-04 NOTE — Progress Notes (Signed)
Pt's mom mentioned to RN that pt has jock itch around the genitals area, felt embarrassed to tell the nursing staffs and MD today. RN assess pt, notes redness and genital area white coated. MD made aware. Will keep monitoring pt.

## 2020-03-04 NOTE — Consult Note (Signed)
ORTHOPAEDIC CONSULTATION  REQUESTING PHYSICIAN: Earl Lagos, MD  Chief Complaint: Increasing cellulitis and new abscess left great toe.  HPI: [name] is a 39 y.o. male who presents with uncontrolled type 2 diabetes patient states that the left great toe has started swelling more now with purulent drainage after starting his oral doxycycline.  Past Medical History:  Diagnosis Date  . Diabetes mellitus without complication (HCC)   . Hypertension    History reviewed. No pertinent surgical history. Social History   Socioeconomic History  . Marital status: Single    Spouse name: Not on file  . Number of children: Not on file  . Years of education: Not on file  . Highest education level: Not on file  Occupational History  . Occupation: Unemployed  Tobacco Use  . Smoking status: Never Smoker  . Smokeless tobacco: Never Used  Vaping Use  . Vaping Use: Never used  Substance and Sexual Activity  . Alcohol use: No    Alcohol/week: 0.0 standard drinks  . Drug use: No  . Sexual activity: Not on file  Other Topics Concern  . Not on file  Social History Narrative   Live with parents in [place].    Social Determinants of Health   Financial Resource Strain:   . Difficulty of Paying Living Expenses: Not on file  Food Insecurity:   . Worried About Programme researcher, broadcasting/film/video in the Last Year: Not on file  . Ran Out of Food in the Last Year: Not on file  Transportation Needs:   . Lack of Transportation (Medical): Not on file  . Lack of Transportation (Non-Medical): Not on file  Physical Activity:   . Days of Exercise per Week: Not on file  . Minutes of Exercise per Session: Not on file  Stress:   . Feeling of Stress : Not on file  Social Connections:   . Frequency of Communication with Friends and Family: Not on file  . Frequency of Social Gatherings with Friends and Family: Not on file  . Attends Religious Services: Not on file  . Active Member of Clubs or  Organizations: Not on file  . Attends Banker Meetings: Not on file  . Marital Status: Not on file   Family History  Problem Relation Age of Onset  . Diabetes Mother   . Hyperlipidemia Father   . Hypertension Father    - negative except otherwise stated in the family history section Allergies  Allergen Reactions  . Milk-Related Compounds Diarrhea  . Iodine     Patient endorses unspecified reaction to iodine in past   Prior to Admission medications   Medication Sig Start Date End Date Taking? Authorizing Provider  acetaminophen (TYLENOL) 500 MG tablet Take 1,000 mg by mouth daily as needed for headache.   Yes [provider]  doxycycline (VIBRA-TABS) 100 MG tablet Take 1 tablet (100 mg total) by mouth 2 (two) times daily. 02/27/20  Yes Persons, [place], [place]  OVER THE COUNTER MEDICATION Apply 1 application topically in the morning and at bedtime. Athlete's foot cream - for jock itch   Yes [provider]  atorvastatin (LIPITOR) 20 MG tablet Take 1 tablet (20 mg total) by mouth daily. Patient not taking: Reported on 03/02/2020 10/19/18   [name], MD  glipiZIDE (GLUCOTROL) 5 MG tablet Take 1 tablet (5 mg total) by mouth 2 (two) times daily before a meal. Patient not taking: Reported on 03/02/2020 10/19/18 03/29/19  [name]  [name], MD  lisinopril (ZESTRIL) 20 MG tablet Take 1 tablet (20 mg total) by mouth daily. Patient not taking: Reported on 03/02/2020 10/19/18   Georgina QuintSagardia, [name], MD  metFORMIN (GLUCOPHAGE) 1000 MG tablet Take 1 tablet (1,000 mg total) by mouth 2 (two) times daily with a meal. Patient not taking: Reported on 03/02/2020 10/19/18   Georgina QuintSagardia, [name], MD  nitroGLYCERIN (NITRODUR - DOSED IN MG/24 HR) 0.2 mg/hr patch Place 1 patch (0.2 mg total) onto the skin daily. Patient not taking: Reported on 03/02/2020 07/11/19   Adonis HugueninZamora, [name], NP   DG Chest 2 View  Result Date: 03/02/2020 CLINICAL DATA:  Suspected sepsis EXAM: CHEST  - 2 VIEW COMPARISON:  None. FINDINGS: No consolidation, features of edema, pneumothorax, or effusion. Pulmonary vascularity is normally distributed. The cardiomediastinal contours are unremarkable. No acute osseous or soft tissue abnormality. Mild degenerative changes in the shoulders and spine. IMPRESSION: No acute cardiopulmonary abnormality. Electronically Signed   By: Kreg ShropshirePrice  DeHay M.D.   On: 03/02/2020 19:09   MR FOOT RIGHT WO CONTRAST  Result Date: 03/03/2020 CLINICAL DATA:  Bilateral great toe wounds. Open draining wound on the right. Diabetes. Clinical concern for osteomyelitis. EXAM: MRI OF THE RIGHT FOREFOOT WITHOUT CONTRAST TECHNIQUE: Multiplanar, multisequence MR imaging of the right forefoot was performed. No intravenous contrast was administered. COMPARISON:  Radiographs 02/27/2020 and 07/30/2017 FINDINGS: Bones/Joint/Cartilage Prominent T2 marrow hyperintensity medially in the base of the distal 1st phalanx, adjacent to skin ulceration described below, suspicious for early osteomyelitis. No cortical destruction or focal T1 marrow signal abnormality. No significant interphalangeal joint effusion. No abnormal signal within the 1st proximal phalanx. The additional toes and metatarsals appear normal. Mild degenerative changes at the 1st metatarsophalangeal joint and it sesamoids associated with a small joint effusion. Normal alignment at the Lisfranc joint. Ligaments Intact Lisfranc ligament. The collateral ligaments of the metatarsophalangeal joints are intact. Muscles and Tendons Intact flexor and extensor tendons without tenosynovitis. 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 Soft tissues There is focal skin ulceration plantar to the interphalangeal joint with surrounding soft tissue swelling. Possible small sinus tract extending to the distal 1st phalangeal base, best seen on the axial images. No focal fluid collection, foreign body or definite soft tissue emphysema.  IMPRESSION: 1. Focal skin ulceration plantar to the interphalangeal joint with surrounding soft tissue swelling and possible small sinus tract extending to the distal 1st phalangeal base. No focal fluid collection, foreign body or definite soft tissue emphysema. 2. Prominent T2 marrow hyperintensity medially in the base of the distal 1st phalanx, suspicious for early osteomyelitis. No cortical destruction or focal T1 marrow signal abnormality. 3. Mild degenerative changes at the 1st metatarsophalangeal joint and it sesamoids associated with a small joint effusion. Electronically Signed   By: Carey BullocksWilliam  Veazey M.D.   On: 03/03/2020 10:37   MR FOOT LEFT WO CONTRAST  Result Date: 03/03/2020 CLINICAL DATA:  Bilateral foot swelling. Prescribed antibiotics. No history of diabetes. Osteomyelitis suspected. EXAM: MRI OF THE LEFT FOOT WITHOUT CONTRAST TECHNIQUE: Multiplanar, multisequence MR imaging of the left forefoot was performed. No intravenous contrast was administered. COMPARISON:  Office radiographs 02/27/2020 and 12/12/2019. FINDINGS: Bones/Joint/Cartilage There is mild marrow T2 hyperintensity within the distal 1st phalanx. No cortical destruction or T1 signal abnormality is identified. There is no abnormal marrow signal within the proximal 1st phalanx, additional digits or visualized metatarsals. There are underlying mild degenerative changes at the 1st metatarsophalangeal and interphalangeal joints with associated small joint effusions. 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 Ligaments Intact Lisfranc ligament. The collateral ligaments of the metatarsophalangeal joints are intact. Muscles and Tendons The forefoot tendons are intact without significant tenosynovitis. There is mild nonspecific T2 hyperintensity within the forefoot musculature. No focal fluid collection. Soft tissues Soft  tissue swelling and edema throughout the great toe with focal soft tissue ulceration plantar to the interphalangeal joint. Probable small associated sinus tract extending to the periosteal surface of the distal 1st phalanx, best seen on image 25/5. No focal fluid collections are identified. In correlation with recent radiographs, there is probable associated soft tissue emphysema in the great toe. No evidence of foreign body. IMPRESSION: 1. Soft tissue swelling and edema throughout the great toe with focal soft tissue ulceration plantar to the interphalangeal joint. Probable small associated sinus tract extending to the periosteal surface of the distal 1st phalanx. No focal fluid collection identified. 2. Mild marrow T2 hyperintensity within the distal 1st phalanx without cortical destruction or T1 signal abnormality. This finding is nonspecific and could be secondary to hyperemia, although suspicious for early osteomyelitis given proximity to the skin ulceration and suspected sinus tract described above. 3. Mild degenerative changes at the 1st metatarsophalangeal and interphalangeal joints. 4. Preliminary report of these findings was discussed with Dr. Preston Fleeting Dr. Elvera Maria at 11:31 p.m. on 03/02/2020. Electronically Signed   By: Carey Bullocks M.D.   On: 03/03/2020 09:01   - pertinent xrays, CT, MRI studies were reviewed and independently interpreted  Positive ROS: All other systems have been reviewed and were otherwise negative with the exception of those mentioned in the HPI and as above.  Physical Exam: General: Alert, no acute distress Psychiatric: Patient is competent for consent with normal mood and affect Lymphatic: No axillary or cervical lymphadenopathy Cardiovascular: No pedal edema Respiratory: No cyanosis, no use of accessory musculature GI: No organomegaly, abdomen is soft and non-tender    Images:  @ENCIMAGES @  Labs:  Lab Results  Component Value Date   HGBA1C 11.8 (H) 03/03/2020     HGBA1C 11.4 (A) 05/02/2018   HGBA1C 11.3 (A) 01/25/2018   REPTSTATUS PENDING 03/02/2020   CULT GRAM POSITIVE COCCI 03/02/2020    Lab Results  Component Value Date   ALBUMIN 2.5 (L) 03/03/2020   ALBUMIN 3.1 (L) 03/02/2020   ALBUMIN 3.6 04/04/2019    Neurologic: Patient does not have protective sensation bilateral lower extremities.   MUSCULOSKELETAL:   Skin: Examination patient has increased swelling and cellulitis of the left great toe there is purulent drainage dorsally at the IP joint.  Patient has a strong dorsalis pedis pulse there is no ascending cellulitis proximal to the MTP joint.  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  Review of the MRI scan of the right great toe shows some edema of the medial aspect of the tuft of the right great toe this does not communicate with the ulcer and appears to be more of a stress reaction due to the Achilles contracture and hallux rigidus.  The MRI scan of the left great toe does not show the draining abscess and does not show osteomyelitis of the left great toe.  Patient  has a hemoglobin A1c of 11.8 and albumin of 2.5.  Assessment: Assessment: Uncontrolled type 2 diabetes with new abscess left great toe.  Plan: Plan: We will plan for amputation left great toe at the MTP joint on Wednesday.  The MRI scan does show some edema in the medial aspect of the tuft of the right great toe however the right great toe continues to look better we will follow this expectantly.  Thank you for the consult and the opportunity to see Mr. Jonathan Corpus, MD [facility] [phone] 8:14 AM

## 2020-03-04 NOTE — Consult Note (Signed)
[facility] Face-to[facility] Consult   Reason for Consult:  Suicidal ideations, depression  Referring Physician:  Dr Nathen MayMcQulla Patient Identification: [name] CheneyRussell C [name] MRN:  [id number] Principal Diagnosis: Cellulitis  Diagnosis:  Active Problems:   Major depressive disorder, recurrent episode, moderate (HCC)   Cellulitis of left toe   Diabetic ketoacidosis (HCC)   DKA (diabetic ketoacidosis) (HCC)   Sepsis with acute renal failure without septic shock (HCC)   Abscess of great toe, left   Total Time spent with patient: 45 minutes  Subjective:   [name] CheneyRussell C [name] is a 39 y.o. male patient admitted with sepsis and cellulitis, consult for depression.  Patient seen and evaluated in person by this provider.  Her reports his depression is a 5/10 with anxiety.  Anxiety is moderated, both increase with stressors and decrease with quiet, calm environment.  Denies suicidal/homicidal ideations. Hallucinations, and substance use.  No past suicide attempts.  He did see a psychiatrist in the past and Zoloft was effective.  Stopped going when his insurance expired.  Interested in therapy and medication management outpatient after discharge.  Appears to also suffer from social anxiety and agoraphobia.  [facility] provided in discharge instructions for therapy and med management.  Recommend starting Zoloft 25 mg daily.  HPI per MD:  I have seen and evaluated [name] Cheneyussell C Selner and discussed their care with the Residency Team.  In brief, patient is a 39 year old male with a past medical history of uncontrolled type 2 diabetes, recurrent bilateral lower extremity foot wounds, bilateral lower extremity venous insufficiency and bipolar disorder who presented to the ED with fevers and chills over the last 3 days.  Patient states that over the last couple of days he has noted intermittent fevers and chills as well as worsening redness and swelling of his left great toe  with associated pain radiating up to his left shin.  Patient states that he has a similar wound on his right great toe as well.  Both these wounds have been present since 2019 and he follows with Dr. Lajoyce Cornersuda as an outpatient for this.  Patient recently saw Dr. Lajoyce Cornersuda on October 5 for these wounds and was prescribed doxycycline for a 30-day course.  Patient was unable to take his medication secondary to onset of nausea and vomiting.  Patient stated over the last couple of days he has noted nausea and decreased oral intake with intermittent episodes of vomiting which was nonbloody nonbilious.  Of note, patient has not been on his diabetes medications for the last couple of months and has not followed up with his PCP.  He attributes this to his persistent depression.  Patient also notes polydipsia, polyuria over the last couple of days as well as associated lightheadedness and dizziness.  Patient also notes 35 pound weight loss over the last couple of months.  In the ED, patient was noted to be in DKA likely secondary to underlying infection in his great toes.  Patient also endorses worsening depression but currently denies any suicidal or homicidal ideation.  No chest pain, no palpitations, no syncope, no focal weakness, no tingling or numbness, no headache, no blurry vision, no shortness of breath.    Past Psychiatric History: depression  Risk to Self:  none Risk to Others:  none Prior Inpatient Therapy:  none Prior Outpatient Therapy:  none  Past Medical History:  Past Medical History:  Diagnosis Date  . Diabetes mellitus without complication (HCC)   . Hypertension  History reviewed. No pertinent surgical history. Family History:  Family History  Problem Relation Age of Onset  . Diabetes Mother   . Hyperlipidemia Father   . Hypertension Father    Family Psychiatric  History: none Social History:  Social History   Substance and Sexual Activity  Alcohol Use No  . Alcohol/week: 0.0 standard  drinks     Social History   Substance and Sexual Activity  Drug Use No    Social History   Socioeconomic History  . Marital status: Single    Spouse name: Not on file  . Number of children: Not on file  . Years of education: Not on file  . Highest education level: Not on file  Occupational History  . Occupation: Unemployed  Tobacco Use  . Smoking status: Never Smoker  . Smokeless tobacco: Never Used  Vaping Use  . Vaping Use: Never used  Substance and Sexual Activity  . Alcohol use: No    Alcohol/week: 0.0 standard drinks  . Drug use: No  . Sexual activity: Not on file  Other Topics Concern  . Not on file  Social History Narrative   Live with parents in [place].    Social Determinants of Health   Financial Resource Strain:   . Difficulty of Paying Living Expenses: Not on file  Food Insecurity:   . Worried About Programme researcher, broadcasting/film/video in the Last Year: Not on file  . Ran Out of Food in the Last Year: Not on file  Transportation Needs:   . Lack of Transportation (Medical): Not on file  . Lack of Transportation (Non-Medical): Not on file  Physical Activity:   . Days of Exercise per Week: Not on file  . Minutes of Exercise per Session: Not on file  Stress:   . Feeling of Stress : Not on file  Social Connections:   . Frequency of Communication with Friends and Family: Not on file  . Frequency of Social Gatherings with Friends and Family: Not on file  . Attends Religious Services: Not on file  . Active Member of Clubs or Organizations: Not on file  . Attends Banker Meetings: Not on file  . Marital Status: Not on file   Additional Social History:    Allergies:   Allergies  Allergen Reactions  . Milk-Related Compounds Diarrhea  . Iodine     Patient endorses unspecified reaction to iodine in past    Labs:  Results for orders placed or performed during the hospital encounter of 03/02/20 (from the past 48 hour(s))  Urinalysis, Routine w reflex  microscopic Urine, Clean Catch     Status: Abnormal   Collection Time: 03/02/20  3:30 PM  Result Value Ref Range   Color, Urine YELLOW YELLOW   APPearance HAZY (A) CLEAR   Specific Gravity, Urine 1.025 1.005 - 1.030   pH 5.0 5.0 - 8.0   Glucose, UA >=500 (A) NEGATIVE mg/dL   Hgb urine dipstick SMALL (A) NEGATIVE   Bilirubin Urine NEGATIVE NEGATIVE   Ketones, ur 80 (A) NEGATIVE mg/dL   Protein, ur 30 (A) NEGATIVE mg/dL   Nitrite NEGATIVE NEGATIVE   Leukocytes,Ua SMALL (A) NEGATIVE   RBC / HPF 6-10 0 - 5 RBC/hpf   WBC, UA 6-10 0 - 5 WBC/hpf   Bacteria, UA RARE (A) NONE SEEN   Squamous Epithelial / LPF 0-5 0 - 5   Mucus PRESENT    Hyaline Casts, UA PRESENT    Granular Casts, UA PRESENT  Non Squamous Epithelial 0-5 (A) NONE SEEN    Comment: Performed at [facility], [address], [place], [place] [address]  CBG monitoring, ED     Status: Abnormal   Collection Time: 03/02/20  6:43 PM  Result Value Ref Range   Glucose-Capillary 336 (H) 70 - 99 mg/dL    Comment: Glucose reference range applies only to samples taken after fasting for at least 8 hours.  Comprehensive metabolic panel     Status: Abnormal   Collection Time: 03/02/20  6:47 PM  Result Value Ref Range   Sodium 130 (L) 135 - 145 mmol/L   Potassium 4.2 3.5 - 5.1 mmol/L   Chloride 95 (L) 98 - 111 mmol/L   CO2 17 (L) 22 - 32 mmol/L   Glucose, Bld 369 (H) 70 - 99 mg/dL    Comment: Glucose reference range applies only to samples taken after fasting for at least 8 hours.   BUN 17 6 - 20 mg/dL   Creatinine, Ser 4.09 (H) 0.61 - 1.24 mg/dL   Calcium 9.0 8.9 - 81.1 mg/dL   Total Protein 8.3 (H) 6.5 - 8.1 g/dL   Albumin 3.1 (L) 3.5 - 5.0 g/dL   AST 14 (L) 15 - 41 U/L   ALT 15 0 - 44 U/L   Alkaline Phosphatase 84 38 - 126 U/L   Total Bilirubin 1.4 (H) 0.3 - 1.2 mg/dL   GFR, Estimated >91 >47 mL/min   Anion gap 18 (H) 5 - 15    Comment: Performed at [facility], [address], [place], [place] [address]   Lactic acid, plasma     Status: Abnormal   Collection Time: 03/02/20  6:47 PM  Result Value Ref Range   Lactic Acid, Venous 2.3 (HH) 0.5 - 1.9 mmol/L    Comment: CRITICAL RESULT CALLED TO, READ BACK BY AND VERIFIED WITH: [facility] RN @ [phone] 03/02/2020 BY C.EDENS Performed at [facility], [address] [place], [place] [address]   CBC with Differential     Status: Abnormal   Collection Time: 03/02/20  6:47 PM  Result Value Ref Range   WBC 18.5 (H) 4.0 - 10.5 K/uL   RBC 5.40 4.22 - 5.81 MIL/uL   Hemoglobin 14.4 13.0 - 17.0 g/dL   HCT 46.9 39 - 52 %   MCV 84.3 80.0 - 100.0 fL   MCH 26.7 26.0 - 34.0 pg   MCHC 31.6 30.0 - 36.0 g/dL   RDW 62.9 52.8 - 41.3 %   Platelets 304 150 - 400 K/uL   nRBC 0.0 0.0 - 0.2 %   Neutrophils Relative % 88 %   Neutro Abs 16.3 (H) 1.7 - 7.7 K/uL   Lymphocytes Relative 6 %   Lymphs Abs 1.1 0.7 - 4.0 K/uL   Monocytes Relative 5 %   Monocytes Absolute 0.9 0.1 - 1.0 K/uL   Eosinophils Relative 0 %   Eosinophils Absolute 0.0 0 - 0 K/uL   Basophils Relative 0 %   Basophils Absolute 0.1 0 - 0 K/uL   Immature Granulocytes 1 %   Abs Immature Granulocytes 0.14 (H) 0.00 - 0.07 K/uL    Comment: Performed at [facility], [address] [place], [place] [address]  Protime-INR     Status: Abnormal   Collection Time: 03/02/20  6:47 PM  Result Value Ref Range   Prothrombin Time 15.7 (H) 11.4 - 15.2 seconds   INR 1.3 (H) 0.8 - 1.2  Comment: (NOTE) INR goal varies based on device and disease states. Performed at [facility], [address], [place], [place] [address]   Culture, blood (Routine x 2)     Status: Abnormal (Preliminary result)   Collection Time: 03/02/20  6:54 PM   Specimen: BLOOD  Result Value Ref Range   Specimen Description BLOOD LEFT ANTECUBITAL    Special Requests      BOTTLES DRAWN AEROBIC AND ANAEROBIC Blood Culture adequate volume   Culture  Setup Time      GRAM POSITIVE COCCI IN CHAINS AEROBIC BOTTLE  ONLY CRITICAL VALUE NOTED.  VALUE IS CONSISTENT WITH PREVIOUSLY REPORTED AND CALLED VALUE. Performed at [facility], [address] [place], [place] [address]    Culture GROUP B STREP(S.AGALACTIAE)ISOLATED (A)    Report Status PENDING   Culture, blood (Routine x 2)     Status: Abnormal (Preliminary result)   Collection Time: 03/02/20  8:10 PM   Specimen: BLOOD  Result Value Ref Range   Specimen Description BLOOD SITE NOT SPECIFIED    Special Requests      BOTTLES DRAWN AEROBIC AND ANAEROBIC Blood Culture results may not be optimal due to an inadequate volume of blood received in culture bottles   Culture  Setup Time      GRAM POSITIVE COCCI IN CHAINS ANAEROBIC BOTTLE ONLY Organism ID to follow CRITICAL RESULT CALLED TO, READ BACK BY AND VERIFIED WITH: PHARMD C AMEND [phone] MLM    Culture (A)     GROUP B STREP(S.AGALACTIAE)ISOLATED SUSCEPTIBILITIES TO FOLLOW Performed at [facility], [address], [place], [place] [address]    Report Status PENDING   Blood Culture ID Panel (Reflexed)     Status: Abnormal   Collection Time: 03/02/20  8:10 PM  Result Value Ref Range   Enterococcus faecalis NOT DETECTED NOT DETECTED   Enterococcus Faecium NOT DETECTED NOT DETECTED   Listeria monocytogenes NOT DETECTED NOT DETECTED   Staphylococcus species NOT DETECTED NOT DETECTED   Staphylococcus aureus (BCID) NOT DETECTED NOT DETECTED   Staphylococcus epidermidis NOT DETECTED NOT DETECTED   Staphylococcus lugdunensis NOT DETECTED NOT DETECTED   Streptococcus species DETECTED (A) NOT DETECTED    Comment: CRITICAL RESULT CALLED TO, READ BACK BY AND VERIFIED WITH: PHARMD C AMEND [phone] MLM    Streptococcus agalactiae DETECTED (A) NOT DETECTED    Comment: CRITICAL RESULT CALLED TO, READ BACK BY AND VERIFIED WITH: PHARMD C AMEND [phone] MLM    Streptococcus pneumoniae NOT DETECTED NOT DETECTED   Streptococcus pyogenes NOT DETECTED NOT DETECTED    A.calcoaceticus-baumannii NOT DETECTED NOT DETECTED   Bacteroides fragilis NOT DETECTED NOT DETECTED   Enterobacterales NOT DETECTED NOT DETECTED   Enterobacter cloacae complex NOT DETECTED NOT DETECTED   Escherichia coli NOT DETECTED NOT DETECTED   Klebsiella aerogenes NOT DETECTED NOT DETECTED   Klebsiella oxytoca NOT DETECTED NOT DETECTED   Klebsiella pneumoniae NOT DETECTED NOT DETECTED   Proteus species NOT DETECTED NOT DETECTED   Salmonella species NOT DETECTED NOT DETECTED   Serratia marcescens NOT DETECTED NOT DETECTED   Haemophilus influenzae NOT DETECTED NOT DETECTED   Neisseria meningitidis NOT DETECTED NOT DETECTED   Pseudomonas aeruginosa NOT DETECTED NOT DETECTED   Stenotrophomonas maltophilia NOT DETECTED NOT DETECTED   Candida albicans NOT DETECTED NOT DETECTED   Candida auris NOT DETECTED NOT DETECTED   Candida glabrata NOT DETECTED NOT DETECTED   Candida krusei NOT DETECTED NOT DETECTED   Candida  parapsilosis NOT DETECTED NOT DETECTED   Candida tropicalis NOT DETECTED NOT DETECTED   Cryptococcus neoformans/gattii NOT DETECTED NOT DETECTED    Comment: Performed at [facility], [address] [place], [place] [address]  Lactic acid, plasma     Status: Abnormal   Collection Time: 03/02/20  8:47 PM  Result Value Ref Range   Lactic Acid, Venous 2.3 (HH) 0.5 - 1.9 mmol/L    Comment: CRITICAL VALUE NOTED.  VALUE IS CONSISTENT WITH PREVIOUSLY REPORTED AND CALLED VALUE. Performed at [facility] Lab, [address] [place], [place] [address]   Respiratory Panel by RT PCR (Flu A&B, Covid) - Nasopharyngeal Swab     Status: None   Collection Time: 03/02/20  9:09 PM   Specimen: Nasopharyngeal Swab  Result Value Ref Range   SARS Coronavirus 2 by RT PCR NEGATIVE NEGATIVE    Comment: (NOTE) SARS-CoV-2 target nucleic acids are NOT DETECTED.  The SARS-CoV-2 RNA is generally detectable in upper respiratoy specimens during the acute phase of infection. The  lowest concentration of SARS-CoV-2 viral copies this assay can detect is 131 copies/mL. A negative result does not preclude SARS-Cov-2 infection and should not be used as the sole basis for treatment or other patient management decisions. A negative result may occur with  improper specimen collection/handling, submission of specimen other than nasopharyngeal swab, presence of viral mutation(s) within the areas targeted by this assay, and inadequate number of viral copies (<131 copies/mL). A negative result must be combined with clinical observations, patient history, and epidemiological information. The expected result is Negative.  Fact Sheet for Patients:  https://www.moore.com/  Fact Sheet for Healthcare Providers:  https://www.young.biz/  This test is no t yet approved or cleared by the [place] FDA and  has been authorized for detection and/or diagnosis of SARS-CoV-2 by FDA under an Emergency Use Authorization (EUA). This EUA will remain  in effect (meaning this test can be used) for the duration of the COVID-19 declaration under Section 564(b)(1) of the Act, 21 U.S.C. section 360bbb-3(b)(1), unless the authorization is terminated or revoked sooner.     Influenza A by PCR NEGATIVE NEGATIVE   Influenza B by PCR NEGATIVE NEGATIVE    Comment: (NOTE) The Xpert Xpress SARS-CoV-2/FLU/RSV assay is intended as an aid in  the diagnosis of influenza from Nasopharyngeal swab specimens and  should not be used as a sole basis for treatment. Nasal washings and  aspirates are unacceptable for Xpert Xpress SARS-CoV-2/FLU/RSV  testing.  Fact Sheet for Patients: https://www.moore.com/  Fact Sheet for Healthcare Providers: https://www.young.biz/  This test is not yet approved or cleared by the [place] FDA and  has been authorized for detection and/or diagnosis of SARS-CoV-2 by  FDA under an Emergency  Use Authorization (EUA). This EUA will remain  in effect (meaning this test can be used) for the duration of the  Covid-19 declaration under Section 564(b)(1) of the Act, 21  U.S.C. section 360bbb-3(b)(1), unless the authorization is  terminated or revoked. Performed at [facility], [address], [place], [place] [address]   I-Stat venous blood gas, [facility] ED)     Status: Abnormal   Collection Time: 03/02/20  9:22 PM  Result Value Ref Range   pH, Ven 7.379 7.25 - 7.43   pCO2, Ven 28.6 (L) 44 - 60 mmHg   pO2, Ven 23.0 (LL) 32 - 45 mmHg   Bicarbonate 16.9 (L) 20.0 - 28.0 mmol/L   TCO2 18 (L) 22 - 32  mmol/L   O2 Saturation 40.0 %   Acid-base deficit 7.0 (H) 0.0 - 2.0 mmol/L   Sodium 132 (L) 135 - 145 mmol/L   Potassium 4.4 3.5 - 5.1 mmol/L   Calcium, Ion 1.10 (L) 1.15 - 1.40 mmol/L   HCT 43.0 39 - 52 %   Hemoglobin 14.6 13.0 - 17.0 g/dL   Sample type VENOUS    Comment NOTIFIED PHYSICIAN   CBG monitoring, ED     Status: Abnormal   Collection Time: 03/03/20 12:58 AM  Result Value Ref Range   Glucose-Capillary 366 (H) 70 - 99 mg/dL    Comment: Glucose reference range applies only to samples taken after fasting for at least 8 hours.   Comment 1 Notify RN    Comment 2 Document in Chart   Basic metabolic panel     Status: Abnormal   Collection Time: 03/03/20  1:01 AM  Result Value Ref Range   Sodium 130 (L) 135 - 145 mmol/L   Potassium 4.0 3.5 - 5.1 mmol/L   Chloride 98 98 - 111 mmol/L   CO2 17 (L) 22 - 32 mmol/L   Glucose, Bld 364 (H) 70 - 99 mg/dL    Comment: Glucose reference range applies only to samples taken after fasting for at least 8 hours.   BUN 15 6 - 20 mg/dL   Creatinine, Ser 1.61 0.61 - 1.24 mg/dL   Calcium 8.4 (L) 8.9 - 10.3 mg/dL   GFR, Estimated >09 >60 mL/min   Anion gap 15 5 - 15    Comment: Performed at [facility], [address] [place], [place] [address]  Beta-hydroxybutyric acid     Status: Abnormal   Collection Time: 03/03/20  1:01 AM   Result Value Ref Range   Beta-Hydroxybutyric Acid 3.94 (H) 0.05 - 0.27 mmol/L    Comment: Performed at [facility], [address], [place], [place] [address]  CBG monitoring, ED     Status: Abnormal   Collection Time: 03/03/20  2:59 AM  Result Value Ref Range   Glucose-Capillary 311 (H) 70 - 99 mg/dL    Comment: Glucose reference range applies only to samples taken after fasting for at least 8 hours.  CBG monitoring, ED     Status: Abnormal   Collection Time: 03/03/20  4:27 AM  Result Value Ref Range   Glucose-Capillary 281 (H) 70 - 99 mg/dL    Comment: Glucose reference range applies only to samples taken after fasting for at least 8 hours.  HIV Antibody (routine testing w rflx)     Status: None   Collection Time: 03/03/20  4:30 AM  Result Value Ref Range   HIV Screen 4th Generation wRfx Non Reactive Non Reactive    Comment: Performed at [facility], [address], [place], [place] [address]  Comprehensive metabolic panel     Status: Abnormal   Collection Time: 03/03/20  4:30 AM  Result Value Ref Range   Sodium 134 (L) 135 - 145 mmol/L   Potassium 3.7 3.5 - 5.1 mmol/L   Chloride 102 98 - 111 mmol/L   CO2 22 22 - 32 mmol/L   Glucose, Bld 242 (H) 70 - 99 mg/dL    Comment: Glucose reference range applies only to samples taken after fasting for at least 8 hours.   BUN 12 6 - 20 mg/dL   Creatinine, Ser 5.62 0.61 - 1.24 mg/dL   Calcium 8.6 (L) 8.9 - 10.3 mg/dL  Total Protein 7.0 6.5 - 8.1 g/dL   Albumin 2.5 (L) 3.5 - 5.0 g/dL   AST 13 (L) 15 - 41 U/L   ALT 11 0 - 44 U/L   Alkaline Phosphatase 66 38 - 126 U/L   Total Bilirubin 0.8 0.3 - 1.2 mg/dL   GFR, Estimated >40 >98 mL/min   Anion gap 10 5 - 15    Comment: Performed at [facility], [address] [place], [place] [address]  CBC     Status: Abnormal   Collection Time: 03/03/20  4:30 AM  Result Value Ref Range   WBC 14.4 (H) 4.0 - 10.5 K/uL   RBC 4.63 4.22 - 5.81 MIL/uL   Hemoglobin 12.8 (L) 13.0 -  17.0 g/dL   HCT 78.2 39 - 52 %   MCV 84.4 80.0 - 100.0 fL   MCH 27.6 26.0 - 34.0 pg   MCHC 32.7 30.0 - 36.0 g/dL   RDW 95.6 21.3 - 08.6 %   Platelets 244 150 - 400 K/uL   nRBC 0.0 0.0 - 0.2 %    Comment: Performed at [facility], [address] [place], [place] [address]  Hemoglobin A1c     Status: Abnormal   Collection Time: 03/03/20  5:00 AM  Result Value Ref Range   Hgb A1c MFr Bld 11.8 (H) 4.8 - 5.6 %    Comment: (NOTE) Pre diabetes:          5.7%-6.4%  Diabetes:              >6.4%  Glycemic control for   <7.0% adults with diabetes    Mean Plasma Glucose 291.96 mg/dL    Comment: Performed at [facility], [address], [place], [place] [address]  CBG monitoring, ED     Status: Abnormal   Collection Time: 03/03/20  5:39 AM  Result Value Ref Range   Glucose-Capillary 232 (H) 70 - 99 mg/dL    Comment: Glucose reference range applies only to samples taken after fasting for at least 8 hours.  Lactic acid, plasma     Status: None   Collection Time: 03/03/20  5:43 AM  Result Value Ref Range   Lactic Acid, Venous 1.6 0.5 - 1.9 mmol/L    Comment: Performed at [facility], [address] [place], [place] [address]  CBG monitoring, ED     Status: Abnormal   Collection Time: 03/03/20  6:50 AM  Result Value Ref Range   Glucose-Capillary 238 (H) 70 - 99 mg/dL    Comment: Glucose reference range applies only to samples taken after fasting for at least 8 hours.  CBG monitoring, ED     Status: Abnormal   Collection Time: 03/03/20  8:06 AM  Result Value Ref Range   Glucose-Capillary 244 (H) 70 - 99 mg/dL    Comment: Glucose reference range applies only to samples taken after fasting for at least 8 hours.  Basic metabolic panel     Status: Abnormal   Collection Time: 03/03/20  8:31 AM  Result Value Ref Range   Sodium 135 135 - 145 mmol/L   Potassium 3.5 3.5 - 5.1 mmol/L   Chloride 103 98 - 111 mmol/L   CO2 22 22 - 32 mmol/L   Glucose, Bld 291 (H) 70 - 99  mg/dL    Comment: Glucose reference range applies only to samples taken after fasting for at least 8 hours.   BUN 11 6 -  20 mg/dL   Creatinine, Ser 1.47 0.61 - 1.24 mg/dL   Calcium 8.5 (L) 8.9 - 10.3 mg/dL   GFR, Estimated >82 >95 mL/min   Anion gap 10 5 - 15    Comment: Performed at [facility], [address], [place], [place] [address]  Beta-hydroxybutyric acid     Status: Abnormal   Collection Time: 03/03/20  8:31 AM  Result Value Ref Range   Beta-Hydroxybutyric Acid 1.14 (H) 0.05 - 0.27 mmol/L    Comment: Performed at [facility], [address], [place], [place] [address]  CBG monitoring, ED     Status: Abnormal   Collection Time: 03/03/20  9:07 AM  Result Value Ref Range   Glucose-Capillary 244 (H) 70 - 99 mg/dL    Comment: Glucose reference range applies only to samples taken after fasting for at least 8 hours.  CBG monitoring, ED     Status: Abnormal   Collection Time: 03/03/20 10:31 AM  Result Value Ref Range   Glucose-Capillary 203 (H) 70 - 99 mg/dL    Comment: Glucose reference range applies only to samples taken after fasting for at least 8 hours.  CBG monitoring, ED     Status: Abnormal   Collection Time: 03/03/20 11:32 AM  Result Value Ref Range   Glucose-Capillary 209 (H) 70 - 99 mg/dL    Comment: Glucose reference range applies only to samples taken after fasting for at least 8 hours.  CBG monitoring, ED     Status: Abnormal   Collection Time: 03/03/20 12:22 PM  Result Value Ref Range   Glucose-Capillary 231 (H) 70 - 99 mg/dL    Comment: Glucose reference range applies only to samples taken after fasting for at least 8 hours.  Basic metabolic panel     Status: Abnormal   Collection Time: 03/03/20 12:42 PM  Result Value Ref Range   Sodium 135 135 - 145 mmol/L   Potassium 3.5 3.5 - 5.1 mmol/L   Chloride 103 98 - 111 mmol/L   CO2 22 22 - 32 mmol/L   Glucose, Bld 318 (H) 70 - 99 mg/dL    Comment: Glucose reference range applies only to samples taken  after fasting for at least 8 hours.   BUN 11 6 - 20 mg/dL   Creatinine, Ser 4.69 0.61 - 1.24 mg/dL   Calcium 8.3 (L) 8.9 - 10.3 mg/dL   GFR, Estimated >62 >95 mL/min   Anion gap 10 5 - 15    Comment: Performed at [facility], [address] [place], [place] [address]  CBG monitoring, ED     Status: Abnormal   Collection Time: 03/03/20  1:48 PM  Result Value Ref Range   Glucose-Capillary 178 (H) 70 - 99 mg/dL    Comment: Glucose reference range applies only to samples taken after fasting for at least 8 hours.  CBG monitoring, ED     Status: Abnormal   Collection Time: 03/03/20  2:31 PM  Result Value Ref Range   Glucose-Capillary 188 (H) 70 - 99 mg/dL    Comment: Glucose reference range applies only to samples taken after fasting for at least 8 hours.  CBG monitoring, ED     Status: Abnormal   Collection Time: 03/03/20  4:05 PM  Result Value Ref Range   Glucose-Capillary 196 (H) 70 - 99 mg/dL    Comment: Glucose reference range applies only to samples taken after fasting for at least 8 hours.  CBG monitoring,  ED     Status: Abnormal   Collection Time: 03/03/20  5:19 PM  Result Value Ref Range   Glucose-Capillary 191 (H) 70 - 99 mg/dL    Comment: Glucose reference range applies only to samples taken after fasting for at least 8 hours.  Basic metabolic panel     Status: Abnormal   Collection Time: 03/03/20  5:28 PM  Result Value Ref Range   Sodium 134 (L) 135 - 145 mmol/L   Potassium 3.8 3.5 - 5.1 mmol/L   Chloride 103 98 - 111 mmol/L   CO2 21 (L) 22 - 32 mmol/L   Glucose, Bld 213 (H) 70 - 99 mg/dL    Comment: Glucose reference range applies only to samples taken after fasting for at least 8 hours.   BUN 10 6 - 20 mg/dL   Creatinine, Ser 1.61 0.61 - 1.24 mg/dL   Calcium 8.3 (L) 8.9 - 10.3 mg/dL   GFR, Estimated >09 >60 mL/min   Anion gap 10 5 - 15    Comment: Performed at [facility], [address], [place], [place] [address]  Beta-hydroxybutyric acid     Status:  Abnormal   Collection Time: 03/03/20  5:28 PM  Result Value Ref Range   Beta-Hydroxybutyric Acid 1.29 (H) 0.05 - 0.27 mmol/L    Comment: Performed at [facility], [address] [place], [place] [address]  MRSA PCR Screening     Status: None   Collection Time: 03/03/20  7:00 PM   Specimen: Nasopharyngeal  Result Value Ref Range   MRSA by PCR NEGATIVE NEGATIVE    Comment:        The GeneXpert MRSA Assay (FDA approved for NASAL specimens only), is one component of a comprehensive MRSA colonization surveillance program. It is not intended to diagnose MRSA infection nor to guide or monitor treatment for MRSA infections. Performed at [facility], [address], [place], [place] [address]   Glucose, capillary     Status: Abnormal   Collection Time: 03/03/20 10:35 PM  Result Value Ref Range   Glucose-Capillary 265 (H) 70 - 99 mg/dL    Comment: Glucose reference range applies only to samples taken after fasting for at least 8 hours.  Basic metabolic panel     Status: Abnormal   Collection Time: 03/04/20  1:06 AM  Result Value Ref Range   Sodium 134 (L) 135 - 145 mmol/L   Potassium 3.7 3.5 - 5.1 mmol/L   Chloride 102 98 - 111 mmol/L   CO2 20 (L) 22 - 32 mmol/L   Glucose, Bld 241 (H) 70 - 99 mg/dL    Comment: Glucose reference range applies only to samples taken after fasting for at least 8 hours.   BUN 9 6 - 20 mg/dL   Creatinine, Ser 5.62 0.61 - 1.24 mg/dL   Calcium 8.4 (L) 8.9 - 10.3 mg/dL   GFR, Estimated >13 >08 mL/min   Anion gap 12 5 - 15    Comment: Performed at [facility], [address], [place], [place] [address]  CBC with Differential/Platelet     Status: Abnormal   Collection Time: 03/04/20  1:06 AM  Result Value Ref Range   WBC 12.9 (H) 4.0 - 10.5 K/uL   RBC 4.59 4.22 - 5.81 MIL/uL   Hemoglobin 12.6 (L) 13.0 - 17.0 g/dL   HCT 69.6 (L) 39 - 52 %   MCV 83.9 80.0 - 100.0 fL   MCH  27.5 26.0 - 34.0 pg   MCHC 32.7 30.0 - 36.0 g/dL   RDW 21.3  08.6 - 57.8 %   Platelets 299 150 - 400 K/uL   nRBC 0.0 0.0 - 0.2 %   Neutrophils Relative % 75 %   Neutro Abs 9.9 (H) 1.7 - 7.7 K/uL   Lymphocytes Relative 15 %   Lymphs Abs 2.0 0.7 - 4.0 K/uL   Monocytes Relative 7 %   Monocytes Absolute 0.9 0.1 - 1.0 K/uL   Eosinophils Relative 1 %   Eosinophils Absolute 0.1 0 - 0 K/uL   Basophils Relative 1 %   Basophils Absolute 0.1 0 - 0 K/uL   Immature Granulocytes 1 %   Abs Immature Granulocytes 0.06 0.00 - 0.07 K/uL    Comment: Performed at [facility], [address] [place], [place] [address]  Glucose, capillary     Status: Abnormal   Collection Time: 03/04/20  6:03 AM  Result Value Ref Range   Glucose-Capillary 228 (H) 70 - 99 mg/dL    Comment: Glucose reference range applies only to samples taken after fasting for at least 8 hours.  Glucose, capillary     Status: Abnormal   Collection Time: 03/04/20 11:06 AM  Result Value Ref Range   Glucose-Capillary 244 (H) 70 - 99 mg/dL    Comment: Glucose reference range applies only to samples taken after fasting for at least 8 hours.   Comment 1 Notify RN    Comment 2 Document in Chart     Current Facility-Administered Medications  Medication Dose Route Frequency Provider Last Rate Last Admin  . acetaminophen (TYLENOL) tablet 650 mg  650 mg Oral Q6H PRN Aslam, Leanna Sato, MD       Or  . acetaminophen (TYLENOL) suppository 650 mg  650 mg Rectal Q6H PRN Aslam, Sadia, MD      . dextrose 50 % solution 0-50 mL  0-50 mL Intravenous PRN Aslam, Sadia, MD      . heparin injection 5,000 Units  5,000 Units Subcutaneous Q8H Eliezer Bottom, MD   5,000 Units at 03/04/20 0544  . insulin aspart (novoLOG) injection 0-15 Units  0-15 Units Subcutaneous TID WC Bobbye Morton, MD   5 Units at 03/04/20 0701  . insulin aspart (novoLOG) injection 0-5 Units  0-5 Units Subcutaneous QHS Bobbye Morton, MD   3 Units at 03/03/20 2244  . [START ON 03/05/2020] insulin glargine (LANTUS) injection 45 Units  45 Units  Subcutaneous Daily Albertha Ghee, MD      . penicillin G potassium 12 Million Units in dextrose 5 % 500 mL continuous infusion  12 Million Units Intravenous Q12H Dixon, Gomez Cleverly, NP      . polyethylene glycol (MIRALAX / GLYCOLAX) packet 17 g  17 g Oral Daily PRN Eliezer Bottom, MD        Musculoskeletal: Strength & Muscle Tone: decreased Gait & Station: did not witness Patient leans: N/A  Psychiatric Specialty Exam: Physical Exam Vitals and nursing note reviewed.  Constitutional:      Appearance: Normal appearance.  HENT:     Head: Normocephalic.     Nose: Nose normal.  Pulmonary:     Effort: Pulmonary effort is normal.  Musculoskeletal:     Cervical back: Normal range of motion.  Neurological:     General: No focal deficit present.     Mental Status: He is alert and oriented to person, place, and time.  Psychiatric:  Attention and Perception: Attention and perception normal.        Mood and Affect: Mood is anxious and depressed.        Speech: Speech normal.        Behavior: Behavior normal. Behavior is cooperative.        Thought Content: Thought content normal.        Cognition and Memory: Cognition and memory normal.        Judgment: Judgment normal.     Review of Systems  Psychiatric/Behavioral: Positive for dysphoric mood. The patient is nervous/anxious.   All other systems reviewed and are negative.   Blood pressure 125/71, pulse 90, temperature 97.7 F (36.5 C), temperature source Oral, resp. rate (!) 30, height 6\' 1"  (1.854 m), weight (!) 148.4 kg, SpO2 98 %.Body mass index is 43.16 kg/m.  General Appearance: Casual  Eye Contact:  Good  Speech:  Normal Rate  Volume:  Normal  Mood:  Anxious and Depressed  Affect:  Blunt  Thought Process:  Coherent and Descriptions of Associations: Intact  Orientation:  Full (Time, Place, and Person)  Thought Content:  WDL and Logical  Suicidal Thoughts:  No  Homicidal Thoughts:  No  Memory:  Immediate;    Good Recent;   Good Remote;   Good  Judgement:  Good  Insight:  Good  Psychomotor Activity:  Decreased  Concentration:  Concentration: Good and Attention Span: Good  Recall:  Good  Fund of Knowledge:  Good  Language:  Good  Akathisia:  No  Handed:  Right  AIMS (if indicated):     Assets:  Housing Leisure Time Resilience Social Support  ADL's:  Intact  Cognition:  WNL  Sleep:      39 yo male with a history of depression and anxiety, stabilized in the past with Zoloft.  No suicidal/homicidal ideations, hallucinations, or substance use.  Agreeable to continue his care in outpatient.  Treatment Plan Summary: Major depressive disorder, recurrent, moderate: -Recommend Zoloft 25 mg daily  Anxiety: -Recommend outpatient therapy at [facility], resources placed in discharge instructions  Disposition: No evidence of imminent risk to self or others at present.   Patient does not meet criteria for psychiatric inpatient admission.  Nanine Means, NP 03/04/2020 1:07 PM

## 2020-03-04 NOTE — Plan of Care (Signed)
Nutrition Education Note  RD consulted for nutrition education regarding diabetes.  Spoke with pt at bedside. Pt reports speaking with Diabetes Coordinator earlier today and that this meeting went well.  Pt states that he does not do much in terms of diabetes self-management. Pt reports that he has been told to completely "cut out" potatoes, pasta, and rice but that he has been unable to do this as some of these items are food staples in his home. Pt reports that he drinks "more soda than anyone should." Pt is willing to reduce the amount of sugar-sweetened beverages that he consumes and wants to start by limiting himself to 1 can of soda daily with his dinner meal.  Pt reports that he wants to focus more on "portion control" but is unsure what a typical portion size looks like. Discussed appropriate portion sizes with pt. Discussed building a balanced plate that includes a protein, a carb, and non-starchy vegetables.  Pt reports that a typical meal for him may include a protein (beef or chicken) and a starch (rice). Pt does like brown rice.  Lab Results  Component Value Date   HGBA1C 11.8 (H) 03/03/2020    RD provided "Carbohydrate Counting for People with Diabetes" handout from the Academy of [employer] Dietetics. Discussed different food groups and their effects on blood sugar, emphasizing carbohydrate-containing foods. Provided list of carbohydrates and recommended serving sizes of common foods.  Discussed importance of controlled and consistent carbohydrate intake throughout the day. Provided examples of ways to balance meals/snacks and encouraged intake of high-fiber, whole grain complex carbohydrates. Teach back method used.  Expect fair to good compliance.  Body mass index is 43.16 kg/m. Pt meets criteria for obesity class III based on current BMI.  Current diet order is Carb Modified, patient is consuming approximately 100% of meals at this time. Labs and medications reviewed.  No further nutrition interventions warranted at this time. RD contact information provided. If additional nutrition issues arise, please re-consult RD.   [name] Reading, MS, RD, LDN Inpatient Clinical Dietitian Please see AMiON for contact information.

## 2020-03-04 NOTE — Progress Notes (Signed)
Subjective: The patient reports that he doing "about as can be expected for someone who is going to lose a toe." Patient does not report pain this AM he does note that his L toe now has ulcer on the front and his lower Left leg is tender.  Objective:  Vital signs in last 24 hours: Vitals:   03/04/20 0053 03/04/20 0400 03/04/20 0412 03/04/20 0740  BP: 116/70 111/73  135/86  Pulse:  (!) 104  (!) 104  Resp: 16 (!) 22  15  Temp: 97.7 F (36.5 C) 98.4 F (36.9 C)  99.6 F (37.6 C)  TempSrc: Oral Oral  Axillary  SpO2:  97%  98%  Weight:   (!) 148.4 kg   Height:       Physical Exam Constitutional:      Appearance: He is obese.  HENT:     Head: Normocephalic and atraumatic.  Cardiovascular:     Rate and Rhythm: Normal rate and regular rhythm.  Pulmonary:     Effort: Pulmonary effort is normal.     Breath sounds: Normal breath sounds.  Abdominal:     General: Bowel sounds are normal.     Palpations: Abdomen is soft.     Tenderness: There is no abdominal tenderness.  Skin:    Comments: Enlarged R and L great toes.  R toe continues to have ulcer with purulent drainage on the ventral aspect  L toe stable ulcer on the ventral aspect.. Now has weeping drainage and cellulitis on the ventral aspect of the toe. Cellulitis (erythema) spreads up to the shin and is circumferential. Patient is tender to palpation of the area    Neurological:     Mental Status: He is alert.     Assessment/Plan:  Active Problems:   Cellulitis of left toe   Diabetic ketoacidosis (HCC)   DKA (diabetic ketoacidosis) ([facility])   Sepsis with acute renal failure without septic shock (HCC)   Abscess of great toe, left  # Sepsis in setting ofLLE Cellulitis due to Infected Diabetic Foot Wound  Ortho consulted and concluded that patient will require amputation of the Left great toe scheduled for Wednesday. At this time they will follow R toe. ID was also consulted for patient. Bcx came back with Strep B  growth. At this time ID recommends narrowing abx to PCN alone. We will discontinue other abx. Patient is showing positive reaction to abx as his leukocytosis has improved.  - Start PCN G IV 6 weeks - Wound Care Consulted  - Check morning CBC, CMP - ESR/CRP AM  # Hyperglycemia w/ uncontrolled T2DM presented with Mild DKA  Patient continue to have tachypnea. Labs: Hgb A1c 11.8.Glucose 241 this AM. K+ this AM is 3.7.  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 - [facility] consulted for PCP needs   # Acute Kidney Injury- resolved BUN/Cr 9/0.62. AKI has resolved with fluids.  -Avoid nephrotoxic agents   # Hyperbilirubinemia- resolved Total bilirubin 1.4>0.8.Has improved with DKA tx and continued abx. - check morning CMP  #  Hypertension Blood pressure well controlled currentlynot on antihypertensives. Patient had been prescribed lisinopril 54m outpatient but had not been taking this. BP this AM remains normotensive 125/71. - Will hold home lisinopril 270mgiven normotensive BP -Continue to monitor  # Hyperlipidemia Chart review notes a history of hyperlipidemia requiring Atorvastatin 2032maily, although patient denies known history or statin use.  - Follow up outpatient   Prior to Admission Living Arrangement: Home Anticipated Discharge Location: Home Barriers to Discharge: Treatment Dispo: Anticipated discharge in approximately 1-2 day(s).   McQFreida BusmanD 03/04/2020, 10:52 AM Pager: [phone] 5pm on weekdays and 1pm on weekends: On Call pager [phone]

## 2020-03-05 DIAGNOSIS — N4829 Other inflammatory disorders of penis: Secondary | ICD-10-CM

## 2020-03-05 DIAGNOSIS — M86172 Other acute osteomyelitis, left ankle and foot: Secondary | ICD-10-CM

## 2020-03-05 DIAGNOSIS — F331 Major depressive disorder, recurrent, moderate: Secondary | ICD-10-CM

## 2020-03-05 DIAGNOSIS — F338 Other recurrent depressive disorders: Secondary | ICD-10-CM

## 2020-03-05 LAB — CBC
HCT: 37.9 % — ABNORMAL LOW (ref 39.0–52.0)
Hemoglobin: 12.1 g/dL — ABNORMAL LOW (ref 13.0–17.0)
MCH: 26.6 pg (ref 26.0–34.0)
MCHC: 31.9 g/dL (ref 30.0–36.0)
MCV: 83.3 fL (ref 80.0–100.0)
Platelets: 272 10*3/uL (ref 150–400)
RBC: 4.55 MIL/uL (ref 4.22–5.81)
RDW: 14 % (ref 11.5–15.5)
WBC: 7.5 10*3/uL (ref 4.0–10.5)
nRBC: 0 % (ref 0.0–0.2)

## 2020-03-05 LAB — BASIC METABOLIC PANEL
Anion gap: 11 (ref 5–15)
BUN: 7 mg/dL (ref 6–20)
CO2: 22 mmol/L (ref 22–32)
Calcium: 8.3 mg/dL — ABNORMAL LOW (ref 8.9–10.3)
Chloride: 104 mmol/L (ref 98–111)
Creatinine, Ser: 0.61 mg/dL (ref 0.61–1.24)
GFR, Estimated: >60 mL/min (ref >60)
Glucose, Bld: 200 mg/dL — ABNORMAL HIGH (ref 70–99)
Potassium: 3.4 mmol/L — ABNORMAL LOW (ref 3.5–5.1)
Sodium: 137 mmol/L (ref 135–145)

## 2020-03-05 LAB — BLOOD CULTURE ID PANEL (REFLEXED) - BCID2

## 2020-03-05 LAB — GLUCOSE, CAPILLARY
Glucose-Capillary: 196 mg/dL — ABNORMAL HIGH (ref 70–99)
Glucose-Capillary: 275 mg/dL — ABNORMAL HIGH (ref 70–99)
Glucose-Capillary: 276 mg/dL — ABNORMAL HIGH (ref 70–99)

## 2020-03-05 LAB — CULTURE, BLOOD (ROUTINE X 2)

## 2020-03-05 LAB — SEDIMENTATION RATE: Sed Rate: 81 mm/hr — ABNORMAL HIGH (ref 0–16)

## 2020-03-05 LAB — C-REACTIVE PROTEIN: CRP: 9 mg/dL — ABNORMAL HIGH (ref <1.0)

## 2020-03-05 MED ORDER — SERTRALINE HCL 25 MG PO TABS
25.0000 mg | ORAL_TABLET | Freq: Every day | ORAL | Status: DC
  Administered 2020-03-05 – 2020-03-08 (×4): 25 mg via ORAL
  Filled 2020-03-05 (×4): qty 1

## 2020-03-05 MED ORDER — DEXTROSE 5 % IV SOLN
3.0000 g | INTRAVENOUS | Status: AC
  Administered 2020-03-06: 3 g via INTRAVENOUS
  Filled 2020-03-05: qty 3

## 2020-03-05 MED ORDER — NYSTATIN 100000 UNIT/GM EX CREA
TOPICAL_CREAM | Freq: Two times a day (BID) | CUTANEOUS | Status: DC
  Administered 2020-03-05 – 2020-03-07 (×2): 1 via TOPICAL
  Filled 2020-03-05 (×2): qty 15

## 2020-03-05 MED ORDER — DEXTROSE 5 % IV SOLN
3.0000 g | INTRAVENOUS | Status: DC
  Filled 2020-03-05 (×2): qty 3000

## 2020-03-05 NOTE — Progress Notes (Signed)
Patient doing okay.  Understands he is going to surgery tomorrow.  Vital signs stable alert pleasant to conversation.  Understands surgery.  Plan n.p.o. after 9 midnight surgery for great toe amputation tomorrow on the left

## 2020-03-05 NOTE — Progress Notes (Signed)
[facility]  Date of Admission:  03/02/2020      Total days of antibiotics 4  pcn day 2          ASSESSMENT: [name] is improving on treatment for group b streptococcus bacteremia related to his diabetic foot infection. Planning for amputation of left great toe tomorrow afternoon. Likely can place PICC line tomorrow if no growth from blood cultures.    PLAN: 1. Continue penicillin G 2. Plan for PICC line possibly tomorrow barring blood cultures  3. Follow operative findings.     Active Problems:   Cellulitis of left toe   Diabetic ketoacidosis (HCC)   DKA (diabetic ketoacidosis) (HCC)   Sepsis with acute renal failure without septic shock (HCC)   Abscess of great toe, left   Major depressive disorder, recurrent episode, moderate (HCC)   Osteomyelitis of left foot (HCC)   Sepsis due to group B Streptococcus with acute renal failure (HCC)   . heparin  5,000 Units Subcutaneous Q8H  . insulin aspart  0-15 Units Subcutaneous TID WC  . insulin aspart  0-5 Units Subcutaneous QHS  . insulin glargine  45 Units Subcutaneous Daily    SUBJECTIVE: Doing OK today. Tells us that he is having surgery tomorrow for toe removal.    Review of Systems: Review of Systems  Constitutional: Negative for chills and fever.  Respiratory: Negative for cough and shortness of breath.   Cardiovascular: Negative for chest pain and palpitations.  Genitourinary: Negative for dysuria.  Musculoskeletal: Negative for back pain and joint pain.    Allergies  Allergen Reactions  . Milk-Related Compounds Diarrhea  . Iodine     Patient endorses unspecified reaction to iodine in past    OBJECTIVE: Vitals:   03/05/20 0002 03/05/20 0330 03/05/20 0417 03/05/20 0900  BP: (!) 147/73 137/81  134/82  Pulse: 98 94  89  Resp: 17 18  18   Temp: 98.1 F (36.7 C) 98.3 F (36.8 C)  98 F (36.7 C)  TempSrc: Oral Oral  Oral  SpO2: 97% 97%  97%  Weight:   (!)  149.8 kg   Height:   6\' 1"  (1.854 m)    Body mass index is 43.57 kg/m.   Physical Exam Constitutional:      Appearance: He is well-developed.     Comments: Resting comfortably in bed   HENT:     Mouth/Throat:     Dentition: Normal dentition. No dental abscesses.  Cardiovascular:     Rate and Rhythm: Normal rate and regular rhythm.     Heart sounds: Normal heart sounds.  Pulmonary:     Effort: Pulmonary effort is normal.     Breath sounds: Normal breath sounds.  Abdominal:     General: There is no distension.     Palpations: Abdomen is soft.     Tenderness: There is no abdominal tenderness.  Musculoskeletal:     Comments: Toes wrapped in clean dry gauze/tape dressing  Lymphadenopathy:     Cervical: No cervical adenopathy.  Skin:    General: Skin is warm and dry.     Findings: No rash.  Neurological:     Mental Status: He is alert and oriented to person, place, and time.  Psychiatric:        Judgment: Judgment normal.     Lab Results Lab Results  Component Value Date   WBC 7.5 03/05/2020   HGB 12.1 (L) 03/05/2020  HCT 37.9 (L) 03/05/2020   MCV 83.3 03/05/2020   PLT 272 03/05/2020    Lab Results  Component Value Date   CREATININE 0.61 03/05/2020   BUN 7 03/05/2020   NA 137 03/05/2020   K 3.4 (L) 03/05/2020   CL 104 03/05/2020   CO2 22 03/05/2020    Lab Results  Component Value Date   ALT 11 03/03/2020   AST 13 (L) 03/03/2020   ALKPHOS 66 03/03/2020   BILITOT 0.8 03/03/2020     Microbiology: Recent Results (from the past 240 hour(s))  Culture, blood (Routine x 2)     Status: Abnormal (Preliminary result)   Collection Time: 03/02/20  6:54 PM   Specimen: BLOOD  Result Value Ref Range Status   Specimen Description BLOOD LEFT ANTECUBITAL  Final   Special Requests   Final    BOTTLES DRAWN AEROBIC AND ANAEROBIC Blood Culture adequate volume   Culture  Setup Time   Final    GRAM POSITIVE COCCI IN CHAINS AEROBIC BOTTLE ONLY CRITICAL VALUE NOTED.  VALUE  IS CONSISTENT WITH PREVIOUSLY REPORTED AND CALLED VALUE. Organism ID to follow    Culture (A)  Final    GROUP B STREP(S.AGALACTIAE)ISOLATED SUSCEPTIBILITIES PERFORMED ON PREVIOUS CULTURE WITHIN THE LAST 5 DAYS. Performed at [facility], [address], [place], [place] [address]    Report Status PENDING  Incomplete  Blood Culture ID Panel (Reflexed)     Status: None   Collection Time: 03/02/20  6:54 PM  Result Value Ref Range Status   Enterococcus faecalis NOT DETECTED NOT DETECTED Final   Enterococcus Faecium NOT DETECTED NOT DETECTED Final   Listeria monocytogenes NOT DETECTED NOT DETECTED Final   Staphylococcus species NOT DETECTED NOT DETECTED Final   Staphylococcus aureus (BCID) NOT DETECTED NOT DETECTED Final   Staphylococcus epidermidis NOT DETECTED NOT DETECTED Final   Staphylococcus lugdunensis NOT DETECTED NOT DETECTED Final   Streptococcus species NOT DETECTED NOT DETECTED Final   Streptococcus agalactiae NOT DETECTED NOT DETECTED Final   Streptococcus pneumoniae NOT DETECTED NOT DETECTED Final   Streptococcus pyogenes NOT DETECTED NOT DETECTED Final   A.calcoaceticus-baumannii NOT DETECTED NOT DETECTED Final   Bacteroides fragilis NOT DETECTED NOT DETECTED Final   Enterobacterales NOT DETECTED NOT DETECTED Final   Enterobacter cloacae complex NOT DETECTED NOT DETECTED Final   Escherichia coli NOT DETECTED NOT DETECTED Final   Klebsiella aerogenes NOT DETECTED NOT DETECTED Final   Klebsiella oxytoca NOT DETECTED NOT DETECTED Final   Klebsiella pneumoniae NOT DETECTED NOT DETECTED Final   Proteus species NOT DETECTED NOT DETECTED Final   Salmonella species NOT DETECTED NOT DETECTED Final   Serratia marcescens NOT DETECTED NOT DETECTED Final   Haemophilus influenzae NOT DETECTED NOT DETECTED Final   Neisseria meningitidis NOT DETECTED NOT DETECTED Final   Pseudomonas aeruginosa NOT DETECTED NOT DETECTED Final   Stenotrophomonas maltophilia NOT DETECTED NOT  DETECTED Final   Candida albicans NOT DETECTED NOT DETECTED Final   Candida auris NOT DETECTED NOT DETECTED Final   Candida glabrata NOT DETECTED NOT DETECTED Final   Candida krusei NOT DETECTED NOT DETECTED Final   Candida parapsilosis NOT DETECTED NOT DETECTED Final   Candida tropicalis NOT DETECTED NOT DETECTED Final   Cryptococcus neoformans/gattii NOT DETECTED NOT DETECTED Final    Comment: Performed at [facility], [address], [place], [place] [address]  Culture, blood (Routine x 2)     Status: Abnormal   Collection Time: 03/02/20  8:10 PM  Specimen: BLOOD  Result Value Ref Range Status   Specimen Description BLOOD SITE NOT SPECIFIED  Final   Special Requests   Final    BOTTLES DRAWN AEROBIC AND ANAEROBIC Blood Culture results may not be optimal due to an inadequate volume of blood received in culture bottles   Culture  Setup Time   Final    GRAM POSITIVE COCCI IN CHAINS ANAEROBIC BOTTLE ONLY CRITICAL RESULT CALLED TO, READ BACK BY AND VERIFIED WITH: PHARMD C AMEND [phone] MLM Performed at [facility], [address] [place], [place] [address]    Culture GROUP B STREP(S.AGALACTIAE)ISOLATED (A)  Final   Report Status 03/05/2020 FINAL  Final   Organism ID, Bacteria GROUP B STREP(S.AGALACTIAE)ISOLATED  Final      Susceptibility   Group b strep(s.agalactiae)isolated - MIC*    CLINDAMYCIN >=1 RESISTANT Resistant     AMPICILLIN <=0.25 SENSITIVE Sensitive     ERYTHROMYCIN >=8 RESISTANT Resistant     VANCOMYCIN 0.5 SENSITIVE Sensitive     CEFTRIAXONE <=0.12 SENSITIVE Sensitive     LEVOFLOXACIN 1 SENSITIVE Sensitive     PENICILLIN Value in next row Sensitive      SENSITIVE0.06    * GROUP B STREP(S.AGALACTIAE)ISOLATED  Blood Culture ID Panel (Reflexed)     Status: Abnormal   Collection Time: 03/02/20  8:10 PM  Result Value Ref Range Status   Enterococcus faecalis NOT DETECTED NOT DETECTED Final   Enterococcus Faecium NOT DETECTED NOT DETECTED Final    Listeria monocytogenes NOT DETECTED NOT DETECTED Final   Staphylococcus species NOT DETECTED NOT DETECTED Final   Staphylococcus aureus (BCID) NOT DETECTED NOT DETECTED Final   Staphylococcus epidermidis NOT DETECTED NOT DETECTED Final   Staphylococcus lugdunensis NOT DETECTED NOT DETECTED Final   Streptococcus species DETECTED (A) NOT DETECTED Final    Comment: CRITICAL RESULT CALLED TO, READ BACK BY AND VERIFIED WITH: PHARMD C AMEND [phone] MLM    Streptococcus agalactiae DETECTED (A) NOT DETECTED Final    Comment: CRITICAL RESULT CALLED TO, READ BACK BY AND VERIFIED WITH: PHARMD C AMEND [phone] MLM    Streptococcus pneumoniae NOT DETECTED NOT DETECTED Final   Streptococcus pyogenes NOT DETECTED NOT DETECTED Final   A.calcoaceticus-baumannii NOT DETECTED NOT DETECTED Final   Bacteroides fragilis NOT DETECTED NOT DETECTED Final   Enterobacterales NOT DETECTED NOT DETECTED Final   Enterobacter cloacae complex NOT DETECTED NOT DETECTED Final   Escherichia coli NOT DETECTED NOT DETECTED Final   Klebsiella aerogenes NOT DETECTED NOT DETECTED Final   Klebsiella oxytoca NOT DETECTED NOT DETECTED Final   Klebsiella pneumoniae NOT DETECTED NOT DETECTED Final   Proteus species NOT DETECTED NOT DETECTED Final   Salmonella species NOT DETECTED NOT DETECTED Final   Serratia marcescens NOT DETECTED NOT DETECTED Final   Haemophilus influenzae NOT DETECTED NOT DETECTED Final   Neisseria meningitidis NOT DETECTED NOT DETECTED Final   Pseudomonas aeruginosa NOT DETECTED NOT DETECTED Final   Stenotrophomonas maltophilia NOT DETECTED NOT DETECTED Final   Candida albicans NOT DETECTED NOT DETECTED Final   Candida auris NOT DETECTED NOT DETECTED Final   Candida glabrata NOT DETECTED NOT DETECTED Final   Candida krusei NOT DETECTED NOT DETECTED Final   Candida parapsilosis NOT DETECTED NOT DETECTED Final   Candida tropicalis NOT DETECTED NOT DETECTED Final   Cryptococcus neoformans/gattii NOT  DETECTED NOT DETECTED Final    Comment: Performed at [facility], [address], [place], [place] [address]  Respiratory Panel by RT  PCR (Flu A&B, Covid) - Nasopharyngeal Swab     Status: None   Collection Time: 03/02/20  9:09 PM   Specimen: Nasopharyngeal Swab  Result Value Ref Range Status   SARS Coronavirus 2 by RT PCR NEGATIVE NEGATIVE Final    Comment: (NOTE) SARS-CoV-2 target nucleic acids are NOT DETECTED.  The SARS-CoV-2 RNA is generally detectable in upper respiratoy specimens during the acute phase of infection. The lowest concentration of SARS-CoV-2 viral copies this assay can detect is 131 copies/mL. A negative result does not preclude SARS-Cov-2 infection and should not be used as the sole basis for treatment or other patient management decisions. A negative result may occur with  improper specimen collection/handling, submission of specimen other than nasopharyngeal swab, presence of viral mutation(s) within the areas targeted by this assay, and inadequate number of viral copies (<131 copies/mL). A negative result must be combined with clinical observations, patient history, and epidemiological information. The expected result is Negative.  Fact Sheet for Patients:  https://www.moore.com/  Fact Sheet for Healthcare Providers:  https://www.young.biz/  This test is no t yet approved or cleared by the [place] FDA and  has been authorized for detection and/or diagnosis of SARS-CoV-2 by FDA under an Emergency Use Authorization (EUA). This EUA will remain  in effect (meaning this test can be used) for the duration of the COVID-19 declaration under Section 564(b)(1) of the Act, 21 U.S.C. section 360bbb-3(b)(1), unless the authorization is terminated or revoked sooner.     Influenza A by PCR NEGATIVE NEGATIVE Final   Influenza B by PCR NEGATIVE NEGATIVE Final    Comment: (NOTE) The Xpert Xpress SARS-CoV-2/FLU/RSV assay  is intended as an aid in  the diagnosis of influenza from Nasopharyngeal swab specimens and  should not be used as a sole basis for treatment. Nasal washings and  aspirates are unacceptable for Xpert Xpress SARS-CoV-2/FLU/RSV  testing.  Fact Sheet for Patients: https://www.moore.com/  Fact Sheet for Healthcare Providers: https://www.young.biz/  This test is not yet approved or cleared by the [place] FDA and  has been authorized for detection and/or diagnosis of SARS-CoV-2 by  FDA under an Emergency Use Authorization (EUA). This EUA will remain  in effect (meaning this test can be used) for the duration of the  Covid-19 declaration under Section 564(b)(1) of the Act, 21  U.S.C. section 360bbb-3(b)(1), unless the authorization is  terminated or revoked. Performed at [facility], [address] [place], [place] [address]   MRSA PCR Screening     Status: None   Collection Time: 03/03/20  7:00 PM   Specimen: Nasopharyngeal  Result Value Ref Range Status   MRSA by PCR NEGATIVE NEGATIVE Final    Comment:        The GeneXpert MRSA Assay (FDA approved for NASAL specimens only), is one component of a comprehensive MRSA colonization surveillance program. It is not intended to diagnose MRSA infection nor to guide or monitor treatment for MRSA infections. Performed at [facility], [address] [place], [place] [address]   Culture, blood (Routine X 2) w Reflex to ID Panel     Status: None (Preliminary result)   Collection Time: 03/04/20  3:22 PM   Specimen: BLOOD RIGHT HAND  Result Value Ref Range Status   Specimen Description BLOOD RIGHT HAND  Final   Special Requests   Final    BOTTLES DRAWN AEROBIC AND ANAEROBIC Blood Culture adequate volume   Culture   Final    NO GROWTH <  24 HOURS Performed at [facility], [address], [place], [place] [address]    Report Status PENDING  Incomplete  Culture, blood  (Routine X 2) w Reflex to ID Panel     Status: None (Preliminary result)   Collection Time: 03/04/20  3:30 PM   Specimen: BLOOD LEFT HAND  Result Value Ref Range Status   Specimen Description BLOOD LEFT HAND  Final   Special Requests   Final    BOTTLES DRAWN AEROBIC AND ANAEROBIC Blood Culture results may not be optimal due to an inadequate volume of blood received in culture bottles   Culture   Final    NO GROWTH < 24 HOURS Performed at [facility], [address] [place], [place] [address]    Report Status PENDING  Incomplete    Rexene Alberts, MSN, NP-C [facility]  [email or web address] Pager: [phone] Office: [phone] [facility] Main Line: [phone]

## 2020-03-05 NOTE — Progress Notes (Signed)
Subjective: Patient doing well this AM. He does not report chest pain no abdominal pain. Upon questioning patient does report that he has an intermittent rash in his genital area.   Objective:  Vital signs in last 24 hours: Vitals:   03/05/20 0002 03/05/20 0330 03/05/20 0417 03/05/20 0900  BP: (!) 147/73 137/81  134/82  Pulse: 98 94  89  Resp: '17 18  18  ' Temp: 98.1 F (36.7 C) 98.3 F (36.8 C)  98 F (36.7 C)  TempSrc: Oral Oral  Oral  SpO2: 97% 97%  97%  Weight:   (!) 149.8 kg   Height:   '6\' 1"'  (1.854 m)    Physical Exam HENT:     Head: Normocephalic and atraumatic.  Cardiovascular:     Rate and Rhythm: Normal rate and regular rhythm.  Pulmonary:     Effort: Pulmonary effort is normal.     Breath sounds: Normal breath sounds.  Abdominal:     General: Bowel sounds are normal.     Palpations: Abdomen is soft.     Tenderness: There is no abdominal tenderness.  Genitourinary:    Comments: Patient is uncircumcised. Has white fungal rash underneath foreskin on penis Skin:    Comments: Left lower leg edema has significantly decreased and patient has decreased tenderness to palpation.  R and L large toes are bandaged. L toe has slightly decreased swelling  Neurological:     Mental Status: He is alert and oriented to person, place, and time.     Assessment/Plan:  Active Problems:   Cellulitis of left toe   Diabetic ketoacidosis (HCC)   DKA (diabetic ketoacidosis) (Day)   Sepsis with acute renal failure without septic shock (HCC)   Abscess of great toe, left   Major depressive disorder, recurrent episode, moderate (HCC)   Osteomyelitis of left foot (HCC)   Sepsis due to group B Streptococcus with acute renal failure [name])  [name] is a 39 y.o. 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  # Sepsis in setting ofLLE Cellulitis due to Infected Diabetic Foot Wound Patient will undergo amputation of the L great toe tom. Patient responding well to PCN G. Leukocytosis has resolved. ID saw patient and recommend PICC line tomorrow if no growth in Bcx. Bcx continue to not have growth at this time. CRP at this time is 9.0. ESR is 81.  - Start PCN G IV 6 weeks - NPO @ [place] - Wound Care Consulted  - Check morning CBC, CMP   #Hyperglycemia w/ uncontrolled T2DM presented with Mild DKA  Patient continue to have tachypnea, but is improving.Labs: Hgb A1c 11.8.Glucose 200 this AM. K+ this AM is 3.4.  - KCl replacement as needed - Lantus 45 U SAI 0-15 TID w/ SAI 0-5 U qh w/ moderate Sliding scale No meal time correction -Carb modified diet - Tylenol 650 q6hrs PRN for pain   #Penile rash Patient's mother called RN to let her know that patient had "rash in his groin area." Patient had had the rash for a while prior to admission, but was too embarrassed to tell anyone. On physical exam the rash is actually located on the patient's penis underneath his foreskin and appears fungal. Patient reported that he had been trying lotrimin with "some" improvement.  - Nystatin BID  #Mood disorder  concern for Passive vs Active SI at presentation  Will restart patient on Zoloft 21m and will reach out to SW closer to D'c [facility] [place] [facility] - Started Zoloft 275m  # Acute Kidney Injury-resolved BUN/Cr 7/0.61. AKI has resolved with fluids. -Avoid nephrotoxic agents   # Hyperbilirubinemia- resolved Total bilirubin 1.4>0.8.Has improved with DKA tx and continued abx. - check morning CMP  # Hypertension Blood pressure well controlled currentlynot on antihypertensives. Patient had been prescribed lisinopril 2028mutpatient but had not been  taking this.Mild HTN 134/82-141/81. - Will hold home lisinopril 36m60mContinue to monitor  # Hyperlipidemia Chart review notes a history of hyperlipidemia requiring Atorvastatin 36mg29mly, although patient denies known history or statin use.  - Follow up outpatient   Prior to Admission Living Arrangement: Home Anticipated Discharge Location: Home Barriers to Discharge: Treatment Dispo: Anticipated discharge in approximately 1-2 day(s).    McQuiFreida Busman10/04/2020, 1:35 PM Pager: [phone] 5pm on weekdays and 1pm on weekends: On Call pager [phone]

## 2020-03-06 ENCOUNTER — Encounter (HOSPITAL_COMMUNITY): Payer: Self-pay | Admitting: Internal Medicine

## 2020-03-06 ENCOUNTER — Inpatient Hospital Stay (HOSPITAL_COMMUNITY): Payer: Self-pay | Admitting: Certified Registered"

## 2020-03-06 ENCOUNTER — Encounter (HOSPITAL_COMMUNITY)
Admission: EM | Disposition: A | Discharge status: Home / Self Care | Payer: Self-pay | Source: Home / Self Care | Attending: Internal Medicine

## 2020-03-06 DIAGNOSIS — L97529 Non-pressure chronic ulcer of other part of left foot with unspecified severity: Secondary | ICD-10-CM

## 2020-03-06 DIAGNOSIS — M86272 Subacute osteomyelitis, left ankle and foot: Secondary | ICD-10-CM

## 2020-03-06 HISTORY — PX: AMPUTATION: SHX166

## 2020-03-06 LAB — BASIC METABOLIC PANEL
Anion gap: 7 (ref 5–15)
BUN: <5 mg/dL — ABNORMAL LOW (ref 6–20)
CO2: 24 mmol/L (ref 22–32)
Calcium: 8.5 mg/dL — ABNORMAL LOW (ref 8.9–10.3)
Chloride: 104 mmol/L (ref 98–111)
Creatinine, Ser: 0.54 mg/dL — ABNORMAL LOW (ref 0.61–1.24)
GFR, Estimated: >60 mL/min (ref >60)
Glucose, Bld: 211 mg/dL — ABNORMAL HIGH (ref 70–99)
Potassium: 3.6 mmol/L (ref 3.5–5.1)
Sodium: 135 mmol/L (ref 135–145)

## 2020-03-06 LAB — CBC WITH DIFFERENTIAL/PLATELET
Abs Immature Granulocytes: 0.07 10*3/uL (ref 0.00–0.07)
Basophils Absolute: 0 10*3/uL (ref 0.0–0.1)
Basophils Relative: 1 %
Eosinophils Absolute: 0.1 10*3/uL (ref 0.0–0.5)
Eosinophils Relative: 1 %
HCT: 38.5 % — ABNORMAL LOW (ref 39.0–52.0)
Hemoglobin: 12.3 g/dL — ABNORMAL LOW (ref 13.0–17.0)
Immature Granulocytes: 1 %
Lymphocytes Relative: 24 %
Lymphs Abs: 2.1 10*3/uL (ref 0.7–4.0)
MCH: 26.5 pg (ref 26.0–34.0)
MCHC: 31.9 g/dL (ref 30.0–36.0)
MCV: 82.8 fL (ref 80.0–100.0)
Monocytes Absolute: 0.7 10*3/uL (ref 0.1–1.0)
Monocytes Relative: 8 %
Neutro Abs: 5.6 10*3/uL (ref 1.7–7.7)
Neutrophils Relative %: 65 %
Platelets: 280 10*3/uL (ref 150–400)
RBC: 4.65 MIL/uL (ref 4.22–5.81)
RDW: 13.7 % (ref 11.5–15.5)
WBC: 8.6 10*3/uL (ref 4.0–10.5)
nRBC: 0 % (ref 0.0–0.2)

## 2020-03-06 LAB — GLUCOSE, CAPILLARY
Glucose-Capillary: 237 mg/dL — ABNORMAL HIGH (ref 70–99)
Glucose-Capillary: 225 mg/dL — ABNORMAL HIGH (ref 70–99)
Glucose-Capillary: 196 mg/dL — ABNORMAL HIGH (ref 70–99)
Glucose-Capillary: 214 mg/dL — ABNORMAL HIGH (ref 70–99)
Glucose-Capillary: 179 mg/dL — ABNORMAL HIGH (ref 70–99)
Glucose-Capillary: 152 mg/dL — ABNORMAL HIGH (ref 70–99)
Glucose-Capillary: 184 mg/dL — ABNORMAL HIGH (ref 70–99)

## 2020-03-06 SURGERY — AMPUTATION DIGIT
Anesthesia: General | Laterality: Left

## 2020-03-06 MED ORDER — ONDANSETRON HCL 4 MG PO TABS: 4.0000 mg | ORAL_TABLET | Freq: Four times a day (QID) | ORAL | Status: DC | PRN

## 2020-03-06 MED ORDER — MIDAZOLAM HCL 2 MG/2ML IJ SOLN
INTRAMUSCULAR | Status: DC | PRN
  Administered 2020-03-06: 2 mg via INTRAVENOUS

## 2020-03-06 MED ORDER — LIDOCAINE 2% (20 MG/ML) 5 ML SYRINGE
INTRAMUSCULAR | Status: DC | PRN
  Administered 2020-03-06: 60 mg via INTRAVENOUS

## 2020-03-06 MED ORDER — CHLORHEXIDINE GLUCONATE 0.12 % MT SOLN
15.0000 mL | OROMUCOSAL | Status: AC
  Filled 2020-03-06: qty 15

## 2020-03-06 MED ORDER — FENTANYL CITRATE (PF) 250 MCG/5ML IJ SOLN
INTRAMUSCULAR | Status: DC | PRN
Start: 2020-03-06 — End: 2020-03-06
  Administered 2020-03-06: 100 ug via INTRAVENOUS

## 2020-03-06 MED ORDER — ONDANSETRON HCL 4 MG/2ML IJ SOLN: 4.0000 mg | Freq: Once | INTRAMUSCULAR | Status: DC | PRN

## 2020-03-06 MED ORDER — PROPOFOL 10 MG/ML IV BOLUS
INTRAVENOUS | Status: AC
  Filled 2020-03-06: qty 20

## 2020-03-06 MED ORDER — PHENYLEPHRINE 40 MCG/ML (10ML) SYRINGE FOR IV PUSH (FOR BLOOD PRESSURE SUPPORT)
PREFILLED_SYRINGE | INTRAVENOUS | Status: AC
  Filled 2020-03-06: qty 10

## 2020-03-06 MED ORDER — LACTATED RINGERS IV SOLN: INTRAVENOUS | Status: DC

## 2020-03-06 MED ORDER — METOCLOPRAMIDE HCL 5 MG/ML IJ SOLN: 5.0000 mg | Freq: Three times a day (TID) | INTRAMUSCULAR | Status: DC | PRN

## 2020-03-06 MED ORDER — CHLORHEXIDINE GLUCONATE 0.12 % MT SOLN
OROMUCOSAL | Status: AC
  Administered 2020-03-06: 15 mL via OROMUCOSAL
  Filled 2020-03-06: qty 15

## 2020-03-06 MED ORDER — DOCUSATE SODIUM 100 MG PO CAPS
100.0000 mg | ORAL_CAPSULE | Freq: Two times a day (BID) | ORAL | Status: DC
  Filled 2020-03-06 (×4): qty 1

## 2020-03-06 MED ORDER — HYDROMORPHONE HCL 1 MG/ML IJ SOLN: 0.2500 mg | INTRAMUSCULAR | Status: DC | PRN

## 2020-03-06 MED ORDER — 0.9 % SODIUM CHLORIDE (POUR BTL) OPTIME
TOPICAL | Status: DC | PRN
  Administered 2020-03-06: 1000 mL

## 2020-03-06 MED ORDER — EPHEDRINE 5 MG/ML INJ
INTRAVENOUS | Status: AC
  Filled 2020-03-06: qty 10

## 2020-03-06 MED ORDER — MIDAZOLAM HCL 2 MG/2ML IJ SOLN
INTRAMUSCULAR | Status: AC
  Filled 2020-03-06: qty 2

## 2020-03-06 MED ORDER — SODIUM CHLORIDE 0.9 % IV SOLN
3.0000 g | Freq: Four times a day (QID) | INTRAVENOUS | Status: DC
  Administered 2020-03-06 – 2020-03-08 (×7): 3 g via INTRAVENOUS
  Filled 2020-03-06 (×2): qty 3
  Filled 2020-03-06 (×4): qty 8
  Filled 2020-03-06: qty 3
  Filled 2020-03-06 (×3): qty 8

## 2020-03-06 MED ORDER — SODIUM CHLORIDE 0.9 % IV SOLN
2.0000 g | INTRAVENOUS | Status: DC
  Filled 2020-03-06: qty 20

## 2020-03-06 MED ORDER — OXYCODONE HCL 5 MG PO TABS: 5.0000 mg | ORAL_TABLET | ORAL | Status: DC | PRN

## 2020-03-06 MED ORDER — DEXAMETHASONE SODIUM PHOSPHATE 10 MG/ML IJ SOLN
INTRAMUSCULAR | Status: AC
  Filled 2020-03-06: qty 1

## 2020-03-06 MED ORDER — METOCLOPRAMIDE HCL 10 MG PO TABS: 5.0000 mg | ORAL_TABLET | Freq: Three times a day (TID) | ORAL | Status: DC | PRN

## 2020-03-06 MED ORDER — PROPOFOL 10 MG/ML IV BOLUS
INTRAVENOUS | Status: DC | PRN
  Administered 2020-03-06: 200 mg via INTRAVENOUS

## 2020-03-06 MED ORDER — ONDANSETRON HCL 4 MG/2ML IJ SOLN
INTRAMUSCULAR | Status: AC
  Filled 2020-03-06: qty 2

## 2020-03-06 MED ORDER — SODIUM CHLORIDE 0.9 % IV SOLN: INTRAVENOUS | Status: DC

## 2020-03-06 MED ORDER — ONDANSETRON HCL 4 MG/2ML IJ SOLN: 4.0000 mg | Freq: Four times a day (QID) | INTRAMUSCULAR | Status: DC | PRN

## 2020-03-06 MED ORDER — FENTANYL CITRATE (PF) 250 MCG/5ML IJ SOLN
INTRAMUSCULAR | Status: AC
  Filled 2020-03-06: qty 5

## 2020-03-06 MED ORDER — MEPERIDINE HCL 25 MG/ML IJ SOLN: 6.2500 mg | INTRAMUSCULAR | Status: DC | PRN

## 2020-03-06 MED ORDER — HYDROMORPHONE HCL 1 MG/ML IJ SOLN: 0.5000 mg | INTRAMUSCULAR | Status: DC | PRN

## 2020-03-06 SURGICAL SUPPLY — 29 items
BLADE SURG 21 STRL SS (BLADE) ×3 IMPLANT
BNDG CMPR 9X4 STRL LF SNTH (GAUZE/BANDAGES/DRESSINGS)
BNDG COHESIVE 4X5 TAN STRL (GAUZE/BANDAGES/DRESSINGS) ×3 IMPLANT
BNDG ESMARK 4X9 LF (GAUZE/BANDAGES/DRESSINGS) IMPLANT
BNDG GAUZE ELAST 4 BULKY (GAUZE/BANDAGES/DRESSINGS) ×3 IMPLANT
COVER SURGICAL LIGHT HANDLE (MISCELLANEOUS) ×6 IMPLANT
COVER WAND RF STERILE (DRAPES) ×3 IMPLANT
DRAPE U-SHAPE 47X51 STRL (DRAPES) ×3 IMPLANT
DRSG ADAPTIC 3X8 NADH LF (GAUZE/BANDAGES/DRESSINGS) ×2 IMPLANT
DRSG PAD ABDOMINAL 8X10 ST (GAUZE/BANDAGES/DRESSINGS) ×3 IMPLANT
DURAPREP 26ML APPLICATOR (WOUND CARE) ×3 IMPLANT
ELECT REM PT RETURN 9FT ADLT (ELECTROSURGICAL) ×3
ELECTRODE REM PT RTRN 9FT ADLT (ELECTROSURGICAL) ×1 IMPLANT
GAUZE SPONGE 4X4 12PLY STRL (GAUZE/BANDAGES/DRESSINGS) ×2 IMPLANT
GLOVE BIOGEL PI IND STRL 9 (GLOVE) ×1 IMPLANT
GLOVE BIOGEL PI INDICATOR 9 (GLOVE) ×2
GLOVE SURG ORTHO 9.0 STRL STRW (GLOVE) ×3 IMPLANT
GOWN STRL REUS W/ TWL XL LVL3 (GOWN DISPOSABLE) ×2 IMPLANT
GOWN STRL REUS W/TWL XL LVL3 (GOWN DISPOSABLE) ×6
KIT BASIN OR (CUSTOM PROCEDURE TRAY) ×3 IMPLANT
KIT TURNOVER KIT B (KITS) ×3 IMPLANT
MANIFOLD NEPTUNE II (INSTRUMENTS) ×3 IMPLANT
NEEDLE 22X1 1/2 (OR ONLY) (NEEDLE) IMPLANT
NS IRRIG 1000ML POUR BTL (IV SOLUTION) ×3 IMPLANT
PACK ORTHO EXTREMITY (CUSTOM PROCEDURE TRAY) ×3 IMPLANT
PAD ARMBOARD 7.5X6 YLW CONV (MISCELLANEOUS) ×6 IMPLANT
SUT ETHILON 2 0 PSLX (SUTURE) ×5 IMPLANT
SYR CONTROL 10ML LL (SYRINGE) IMPLANT
TOWEL GREEN STERILE (TOWEL DISPOSABLE) ×3 IMPLANT

## 2020-03-06 NOTE — Op Note (Signed)
03/06/2020  1:47 PM  PATIENT:  [name]    PRE-OPERATIVE DIAGNOSIS:  Left Great Toe Osteomyelitis  POST-OPERATIVE DIAGNOSIS:  Same  PROCEDURE:  LEFT GREAT TOE AMPUTATION  SURGEON:  [name], MD  PHYSICIAN ASSISTANT:None ANESTHESIA:   General  PREOPERATIVE INDICATIONS:  [name] is a  39 y.o. male with a diagnosis of Left Great Toe Osteomyelitis who failed conservative measures and elected for surgical management.    The risks benefits and alternatives were discussed with the patient preoperatively including but not limited to the risks of infection, bleeding, nerve injury, cardiopulmonary complications, the need for revision surgery, among others, and the patient was willing to proceed.  OPERATIVE IMPLANTS: None  @ENCIMAGES @  OPERATIVE FINDINGS: no abscess at the level of amputation  OPERATIVE PROCEDURE: Patient was brought the operating room and underwent a general anesthetic.  After adequate levels anesthesia were obtained patient's left lower extremity was prepped using DuraPrep draped into a sterile field a timeout was called.  A fishmouth incision was made just distal to the MTP joint proximal to the ulcerative tissue.  The left great toe was amputated through the MTP joint.  There is no signs of abscess or infected tissue at the level of amputation.  Patient is wound was irrigated with normal saline electrocautery was used for hemostasis the incision was closed using 2-0 nylon a sterile dressing was applied patient was extubated taken the PACU in stable condition.   DISCHARGE PLANNING:  Antibiotic duration:24 hours  Weightbearing: NWB left  Pain medication: opoid pathway  Dressing care/ Wound dressing PRN  Ambulatory devices:walker  Discharge to: home  Follow-up: In the office 1 week post operative.

## 2020-03-06 NOTE — Anesthesia Preprocedure Evaluation (Signed)
Anesthesia Evaluation  Patient identified by MRN, date of birth, ID band Patient awake    Reviewed: Allergy & Precautions, NPO status , Patient's Chart, lab work & pertinent test results  Airway Mallampati: I  TM Distance: >3 FB Neck ROM: Full    Dental   Pulmonary    Pulmonary exam normal        Cardiovascular hypertension, Pt. on medications Normal cardiovascular exam     Neuro/Psych Depression    GI/Hepatic   Endo/Other  diabetes, Type 2, Oral Hypoglycemic Agents  Renal/GU      Musculoskeletal   Abdominal   Peds  Hematology   Anesthesia Other Findings   Reproductive/Obstetrics                             Anesthesia Physical Anesthesia Plan  ASA: III  Anesthesia Plan: General   Post-op Pain Management:    Induction: Intravenous  PONV Risk Score and Plan: 2  Airway Management Planned: LMA  Additional Equipment:   Intra-op Plan:   Post-operative Plan: Extubation in OR  Informed Consent: I have reviewed the patients History and Physical, chart, labs and discussed the procedure including the risks, benefits and alternatives for the proposed anesthesia with the patient or authorized representative who has indicated his/her understanding and acceptance.       Plan Discussed with: CRNA and Surgeon  Anesthesia Plan Comments:         Anesthesia Quick Evaluation

## 2020-03-06 NOTE — Progress Notes (Signed)
Orthopedic Tech Progress Note Patient Details:  [name] [birth year] [id number] Patient was in bed so I left the shoe on counter Ortho Devices Type of Ortho Device: Postop shoe/boot Ortho Device/Splint Location: LLE Ortho Device/Splint Interventions: Ordered, Application, Adjustment   Post Interventions Patient Tolerated: Well Instructions Provided: Care of device, Adjustment of device, Poper ambulation with device   [name] 03/06/2020, 5:34 PM

## 2020-03-06 NOTE — Anesthesia Procedure Notes (Signed)
Procedure Name: LMA Insertion Date/Time: 03/06/2020 1:28 PM Performed by: De Nurse, CRNA Pre-anesthesia Checklist: Patient identified, Emergency Drugs available, Suction available and Patient being monitored Patient Re-evaluated:Patient Re-evaluated prior to induction Oxygen Delivery Method: Circle System Utilized Preoxygenation: Pre-oxygenation with 100% oxygen Induction Type: IV induction Ventilation: Mask ventilation without difficulty LMA: LMA inserted LMA Size: 5.0 Number of attempts: 1 Placement Confirmation: positive ETCO2 Tube secured with: Tape Dental Injury: Teeth and Oropharynx as per pre-operative assessment

## 2020-03-06 NOTE — Progress Notes (Signed)
Inpatient Diabetes Program Recommendations  AACE/ADA: New Consensus Statement on Inpatient Glycemic Control (2015)  Target Ranges:  Prepandial:   less than 140 mg/dL      Peak postprandial:   less than 180 mg/dL (1-2 hours)      Critically ill patients:  140 - 180 mg/dL   Lab Results  Component Value Date   GLUCAP 196 (H) 03/06/2020   HGBA1C 11.8 (H) 03/03/2020    Review of Glycemic Control Results for YAREL, RUSHLOW (MRN [id number]) as of 03/06/2020 09:20  Ref. Range 03/05/2020 10:29 03/05/2020 17:32 03/05/2020 21:07 03/06/2020 06:35  Glucose-Capillary Latest Ref Range: 70 - 99 mg/dL 256 (H) 389 (H) 373 (H) 196 (H)   Diabetes history:DM 2 Outpatient Diabetes medications:Glipizide and metformin in the past Current orders for Inpatient glycemic control:Lantus 45 units QD, Novolog 0-15 units TID, Novolog 0-5 units QHS  A1c 11.8% this admission  Inpatient Diabetes Program Recommendations:  Consider increasing Lantus to 52 units QD and adding Novolog 4 units TID (assuming patient is consuming >50% of meals and following procedure).   Thanks, Lujean Rave, MSN, RNC-OB Diabetes Coordinator [phone] (8a-5p)

## 2020-03-06 NOTE — Progress Notes (Signed)
   Subjective: HD#3   Overnight:No acute overnight events  Today, [name] reports he rested well overnight. His pain has been well controlled. He is not feeling any different since starting Zoloft, but aware it may take weeks to feel different. He is aware his surgery is planned. Made aware he will need a PICC line and 6 weeks of antibiotics.  All questions and concerns addressed.   Objective:  Vital signs in last 24 hours: Vitals:   03/05/20 1553 03/05/20 1924 03/05/20 2330 03/06/20 0345  BP: (!) 150/99 137/79 138/83 134/79  Pulse: 93 90  83  Resp: 20 13 17 18   Temp: 98 F (36.7 C) 98.3 F (36.8 C) 97.7 F (36.5 C) 98.1 F (36.7 C)  TempSrc: Oral Oral Oral Oral  SpO2: 98% 97%  97%  Weight:    (!) 147 kg  Height:       Const: In no apparent distress, lying comfortably in bed, conversational HEENT: Atraumatic, normocephalic CV: RRR, no murmurs, gallop, rub Abd: Bowel sounds present, nondistended, nontender to palpation Ext: Toe wrapped in bandage, DP pulse present   Assessment/Plan:  Active Problems:   Cellulitis of left toe   Diabetic ketoacidosis (HCC)   DKA (diabetic ketoacidosis) (HCC)   Sepsis with acute renal failure without septic shock (HCC)   Abscess of great toe, left   Major depressive disorder, recurrent episode, moderate (HCC)   Osteomyelitis of left foot (HCC)   Sepsis due to group B Streptococcus with acute renal failure [facility])  [name] is a 39 y.o. 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  # Sepsis in setting ofLLE Cellulitis due to Infected Diabetic Foot Wound He has remained afebrile and without leukocytosis. His pain is well controlled on his current regimen. He denies fevers, chills or any systemic findings.  He is well aware of his surgical procedure todayl - Continue PCN G IV 6 weeks - Wound Care Consulted  - Daily CBC, CMP   #Hyperglycemia w/ uncontrolled T2DM presented with Mild DKA  CBGs 196<<276 -Lantus 45 U SAI 0-15 TID w/ SAI 0-5 U qh w/ moderate Sliding scale -Carb modified diet - Tylenol 650 q6hrs PRN for pain   # Penile rash - Nystatin BID   # Mood disorder concern for Passive vs Active SI at presentation - TOC consulted for PCP needs - Continue Zoloft 25mg    # Acute Kidney Injury-resolved - Daily weights, Daily Renal Panel, Strict I/Os, Avoid nephrotoxins (NSAIDs, judicious IV Contrast)  Medication Issues; ? Preferred narcotic agents for pain control are hydromorphone, fentanyl, and methadone. Morphine should not be used.  ? Baclofen should be avoided ? Avoid oral sodium phosphate and magnesium citrate based laxatives / bowel preps    # Hypertension Bp this am 134/79 - Continue to hold lisinopril 20mg  -Continue to monitor   # Hyperlipidemia Previously on Lipitor 20mg  daily. Please resume when appropriate as patient is diabetic and 39 years old - Follow up outpatient  FEN: NPO VTE ppx:SQ Heparin CODE STATUS:FULL  Prior to Admission Living Arrangement:Home Anticipated Discharge Location:Home Barriers to Discharge:Treatment Dispo: Anticipated discharge in approximately2-3day(s).     , MD 03/06/2020, 6:23 AM Pager: [phone] Internal Medicine Teaching Service After 5pm on weekdays and 1pm on weekends: On Call pager: [phone]

## 2020-03-06 NOTE — Plan of Care (Signed)

## 2020-03-06 NOTE — Interval H&P Note (Signed)
History and Physical Interval Note:  03/06/2020 6:45 AM  [name]  has presented today for surgery, with the diagnosis of Left Great Toe Osteomyelitis.  The various methods of treatment have been discussed with the patient and family. After consideration of risks, benefits and other options for treatment, the patient has consented to  Procedure(s): LEFT GREAT TOE AMPUTATION (Left) as a surgical intervention.  The patient's history has been reviewed, patient examined, no change in status, stable for surgery.  I have reviewed the patient's chart and labs.  Questions were answered to the patient's satisfaction.     [name]

## 2020-03-06 NOTE — Progress Notes (Signed)
[facility]  Date of Admission:  03/02/2020      Total days of antibiotics 5  Ceftriaxone day 1           ASSESSMENT: Notified that [name] is now also growing gram negative rods from original cultures on 10/09 in anaerobic bottle--will change to unasyn to include more gram negative and anaerobic coverage while we wait for further identification. Going to OR today for L great toe amputation. Will plan PICC line in AM for home use. I think he will do best on a simple once a day regimen for home administration.     PLAN: 1. Change to Unasyn IV  2. Plan for PICC line in AM 3. Follow operative findings 4. Follow GNR on blood culture     Active Problems:   Cellulitis of left toe   Diabetic ketoacidosis (HCC)   DKA (diabetic ketoacidosis) (HCC)   Sepsis with acute renal failure without septic shock (HCC)   Abscess of great toe, left   Major depressive disorder, recurrent episode, moderate (HCC)   Osteomyelitis of left foot (HCC)   Sepsis due to group B Streptococcus with acute renal failure (HCC)    heparin  5,000 Units Subcutaneous Q8H   insulin aspart  0-15 Units Subcutaneous TID WC   insulin aspart  0-5 Units Subcutaneous QHS   insulin glargine  45 Units Subcutaneous Daily   nystatin cream   Topical BID   sertraline  25 mg Oral Daily    SUBJECTIVE: Doing OK today. Mom at the bedside. Surgery later this afternoon.  No fevers/chills. No changes with toe size/drainage today on the left.     Review of Systems: Review of Systems  Constitutional: Negative for chills and fever.  Respiratory: Negative for cough and shortness of breath.   Cardiovascular: Negative for chest pain and palpitations.  Genitourinary: Negative for dysuria.  Musculoskeletal: Negative for back pain and joint pain.    Allergies  Allergen Reactions   Milk-Related Compounds Diarrhea   Iodine     Patient endorses unspecified reaction to iodine in  past    OBJECTIVE: Vitals:   03/05/20 1924 03/05/20 2330 03/06/20 0345 03/06/20 0800  BP: 137/79 138/83 134/79   Pulse: 90  83 90  Resp: 13 17 18 20   Temp: 98.3 F (36.8 C) 97.7 F (36.5 C) 98.1 F (36.7 C)   TempSrc: Oral Oral Oral   SpO2: 97%  97%   Weight:   (!) 147 kg   Height:       Body mass index is 42.76 kg/m.   Physical Exam Constitutional:      Appearance: He is well-developed.     Comments: Resting comfortably in bed   HENT:     Mouth/Throat:     Dentition: Normal dentition. No dental abscesses.  Cardiovascular:     Rate and Rhythm: Normal rate and regular rhythm.     Heart sounds: Normal heart sounds.  Pulmonary:     Effort: Pulmonary effort is normal.     Breath sounds: Normal breath sounds.  Abdominal:     General: There is no distension.     Palpations: Abdomen is soft.     Tenderness: There is no abdominal tenderness.  Musculoskeletal:     Comments: Toes wrapped with some thin brown/red drainage on dressing material.   Lymphadenopathy:     Cervical: No cervical adenopathy.  Skin:    General: Skin is warm  and dry.     Findings: No rash.  Neurological:     Mental Status: He is alert and oriented to person, place, and time.  Psychiatric:        Judgment: Judgment normal.     Lab Results Lab Results  Component Value Date   WBC 8.6 03/06/2020   HGB 12.3 (L) 03/06/2020   HCT 38.5 (L) 03/06/2020   MCV 82.8 03/06/2020   PLT 280 03/06/2020    Lab Results  Component Value Date   CREATININE 0.54 (L) 03/06/2020   BUN <5 (L) 03/06/2020   NA 135 03/06/2020   K 3.6 03/06/2020   CL 104 03/06/2020   CO2 24 03/06/2020    Lab Results  Component Value Date   ALT 11 03/03/2020   AST 13 (L) 03/03/2020   ALKPHOS 66 03/03/2020   BILITOT 0.8 03/03/2020     Microbiology: Recent Results (from the past 240 hour(s))  Culture, blood (Routine x 2)     Status: Abnormal (Preliminary result)   Collection Time: 03/02/20  6:54 PM   Specimen: BLOOD    Result Value Ref Range Status   Specimen Description BLOOD LEFT ANTECUBITAL  Final   Special Requests   Final    BOTTLES DRAWN AEROBIC AND ANAEROBIC Blood Culture adequate volume   Culture  Setup Time   Final    GRAM POSITIVE COCCI IN CHAINS AEROBIC BOTTLE ONLY CRITICAL VALUE NOTED.  VALUE IS CONSISTENT WITH PREVIOUSLY REPORTED AND CALLED VALUE. GRAM NEGATIVE RODS ANAEROBIC BOTTLE ONLY CRITICAL RESULT CALLED TO, READ BACK BY AND VERIFIED WITH: PHARMD Lana Fish [phone] 03/06/20    Culture (A)  Final    GROUP B STREP(S.AGALACTIAE)ISOLATED SUSCEPTIBILITIES PERFORMED ON PREVIOUS CULTURE WITHIN THE LAST 5 DAYS. Performed at [facility], [address], [place], [place] [address]    Report Status PENDING  Incomplete  Blood Culture ID Panel (Reflexed)     Status: None   Collection Time: 03/02/20  6:54 PM  Result Value Ref Range Status   Enterococcus faecalis NOT DETECTED NOT DETECTED Final   Enterococcus Faecium NOT DETECTED NOT DETECTED Final   Listeria monocytogenes NOT DETECTED NOT DETECTED Final   Staphylococcus species NOT DETECTED NOT DETECTED Final   Staphylococcus aureus (BCID) NOT DETECTED NOT DETECTED Final   Staphylococcus epidermidis NOT DETECTED NOT DETECTED Final   Staphylococcus lugdunensis NOT DETECTED NOT DETECTED Final   Streptococcus species NOT DETECTED NOT DETECTED Final   Streptococcus agalactiae NOT DETECTED NOT DETECTED Final   Streptococcus pneumoniae NOT DETECTED NOT DETECTED Final   Streptococcus pyogenes NOT DETECTED NOT DETECTED Final   A.calcoaceticus-baumannii NOT DETECTED NOT DETECTED Final   Bacteroides fragilis NOT DETECTED NOT DETECTED Final   Enterobacterales NOT DETECTED NOT DETECTED Final   Enterobacter cloacae complex NOT DETECTED NOT DETECTED Final   Escherichia coli NOT DETECTED NOT DETECTED Final   Klebsiella aerogenes NOT DETECTED NOT DETECTED Final   Klebsiella oxytoca NOT DETECTED NOT DETECTED Final   Klebsiella pneumoniae NOT  DETECTED NOT DETECTED Final   Proteus species NOT DETECTED NOT DETECTED Final   Salmonella species NOT DETECTED NOT DETECTED Final   Serratia marcescens NOT DETECTED NOT DETECTED Final   Haemophilus influenzae NOT DETECTED NOT DETECTED Final   Neisseria meningitidis NOT DETECTED NOT DETECTED Final   Pseudomonas aeruginosa NOT DETECTED NOT DETECTED Final   Stenotrophomonas maltophilia NOT DETECTED NOT DETECTED Final   Candida albicans NOT DETECTED NOT DETECTED Final   Candida auris NOT DETECTED NOT DETECTED Final  Candida glabrata NOT DETECTED NOT DETECTED Final   Candida krusei NOT DETECTED NOT DETECTED Final   Candida parapsilosis NOT DETECTED NOT DETECTED Final   Candida tropicalis NOT DETECTED NOT DETECTED Final   Cryptococcus neoformans/gattii NOT DETECTED NOT DETECTED Final    Comment: Performed at [facility], [address] [place] [address], [place], [place] [address]  Culture, blood (Routine x 2)     Status: Abnormal   Collection Time: 03/02/20  8:10 PM   Specimen: BLOOD  Result Value Ref Range Status   Specimen Description BLOOD SITE NOT SPECIFIED  Final   Special Requests   Final    BOTTLES DRAWN AEROBIC AND ANAEROBIC Blood Culture results may not be optimal due to an inadequate volume of blood received in culture bottles   Culture  Setup Time   Final    GRAM POSITIVE COCCI IN CHAINS ANAEROBIC BOTTLE ONLY CRITICAL RESULT CALLED TO, READ BACK BY AND VERIFIED WITH: PHARMD C AMEND [phone] MLM Performed at [facility], [address], [place], [place] [address]    Culture GROUP B STREP(S.AGALACTIAE)ISOLATED (A)  Final   Report Status 03/05/2020 FINAL  Final   Organism ID, Bacteria GROUP B STREP(S.AGALACTIAE)ISOLATED  Final      Susceptibility   Group b strep(s.agalactiae)isolated - MIC*    CLINDAMYCIN >=1 RESISTANT Resistant     AMPICILLIN <=0.25 SENSITIVE Sensitive     ERYTHROMYCIN >=8 RESISTANT Resistant     VANCOMYCIN 0.5 SENSITIVE Sensitive     CEFTRIAXONE <=0.12  SENSITIVE Sensitive     LEVOFLOXACIN 1 SENSITIVE Sensitive     PENICILLIN Value in next row Sensitive      SENSITIVE0.06    * GROUP B STREP(S.AGALACTIAE)ISOLATED  Blood Culture ID Panel (Reflexed)     Status: Abnormal   Collection Time: 03/02/20  8:10 PM  Result Value Ref Range Status   Enterococcus faecalis NOT DETECTED NOT DETECTED Final   Enterococcus Faecium NOT DETECTED NOT DETECTED Final   Listeria monocytogenes NOT DETECTED NOT DETECTED Final   Staphylococcus species NOT DETECTED NOT DETECTED Final   Staphylococcus aureus (BCID) NOT DETECTED NOT DETECTED Final   Staphylococcus epidermidis NOT DETECTED NOT DETECTED Final   Staphylococcus lugdunensis NOT DETECTED NOT DETECTED Final   Streptococcus species DETECTED (A) NOT DETECTED Final    Comment: CRITICAL RESULT CALLED TO, READ BACK BY AND VERIFIED WITH: PHARMD C AMEND [phone] MLM    Streptococcus agalactiae DETECTED (A) NOT DETECTED Final    Comment: CRITICAL RESULT CALLED TO, READ BACK BY AND VERIFIED WITH: PHARMD C AMEND [phone] MLM    Streptococcus pneumoniae NOT DETECTED NOT DETECTED Final   Streptococcus pyogenes NOT DETECTED NOT DETECTED Final   A.calcoaceticus-baumannii NOT DETECTED NOT DETECTED Final   Bacteroides fragilis NOT DETECTED NOT DETECTED Final   Enterobacterales NOT DETECTED NOT DETECTED Final   Enterobacter cloacae complex NOT DETECTED NOT DETECTED Final   Escherichia coli NOT DETECTED NOT DETECTED Final   Klebsiella aerogenes NOT DETECTED NOT DETECTED Final   Klebsiella oxytoca NOT DETECTED NOT DETECTED Final   Klebsiella pneumoniae NOT DETECTED NOT DETECTED Final   Proteus species NOT DETECTED NOT DETECTED Final   Salmonella species NOT DETECTED NOT DETECTED Final   Serratia marcescens NOT DETECTED NOT DETECTED Final   Haemophilus influenzae NOT DETECTED NOT DETECTED Final   Neisseria meningitidis NOT DETECTED NOT DETECTED Final   Pseudomonas aeruginosa NOT DETECTED NOT DETECTED Final    Stenotrophomonas maltophilia NOT DETECTED NOT DETECTED Final   Candida  albicans NOT DETECTED NOT DETECTED Final   Candida auris NOT DETECTED NOT DETECTED Final   Candida glabrata NOT DETECTED NOT DETECTED Final   Candida krusei NOT DETECTED NOT DETECTED Final   Candida parapsilosis NOT DETECTED NOT DETECTED Final   Candida tropicalis NOT DETECTED NOT DETECTED Final   Cryptococcus neoformans/gattii NOT DETECTED NOT DETECTED Final    Comment: Performed at [facility], [address], [place], [place] [address]  Respiratory Panel by RT PCR (Flu A&B, Covid) - Nasopharyngeal Swab     Status: None   Collection Time: 03/02/20  9:09 PM   Specimen: Nasopharyngeal Swab  Result Value Ref Range Status   SARS Coronavirus 2 by RT PCR NEGATIVE NEGATIVE Final    Comment: (NOTE) SARS-CoV-2 target nucleic acids are NOT DETECTED.  The SARS-CoV-2 RNA is generally detectable in upper respiratoy specimens during the acute phase of infection. The lowest concentration of SARS-CoV-2 viral copies this assay can detect is 131 copies/mL. A negative result does not preclude SARS-Cov-2 infection and should not be used as the sole basis for treatment or other patient management decisions. A negative result may occur with  improper specimen collection/handling, submission of specimen other than nasopharyngeal swab, presence of viral mutation(s) within the areas targeted by this assay, and inadequate number of viral copies (<131 copies/mL). A negative result must be combined with clinical observations, patient history, and epidemiological information. The expected result is Negative.  Fact Sheet for Patients:  https://www.moore.com/  Fact Sheet for Healthcare Providers:  https://www.young.biz/  This test is no t yet approved or cleared by the [place] FDA and  has been authorized for detection and/or diagnosis of SARS-CoV-2 by FDA under an Emergency Use  Authorization (EUA). This EUA will remain  in effect (meaning this test can be used) for the duration of the COVID-19 declaration under Section 564(b)(1) of the Act, 21 U.S.C. section 360bbb-3(b)(1), unless the authorization is terminated or revoked sooner.     Influenza A by PCR NEGATIVE NEGATIVE Final   Influenza B by PCR NEGATIVE NEGATIVE Final    Comment: (NOTE) The Xpert Xpress SARS-CoV-2/FLU/RSV assay is intended as an aid in  the diagnosis of influenza from Nasopharyngeal swab specimens and  should not be used as a sole basis for treatment. Nasal washings and  aspirates are unacceptable for Xpert Xpress SARS-CoV-2/FLU/RSV  testing.  Fact Sheet for Patients: https://www.moore.com/  Fact Sheet for Healthcare Providers: https://www.young.biz/  This test is not yet approved or cleared by the [place] FDA and  has been authorized for detection and/or diagnosis of SARS-CoV-2 by  FDA under an Emergency Use Authorization (EUA). This EUA will remain  in effect (meaning this test can be used) for the duration of the  Covid-19 declaration under Section 564(b)(1) of the Act, 21  U.S.C. section 360bbb-3(b)(1), unless the authorization is  terminated or revoked. Performed at [facility], [address], [place], [place] [address]   MRSA PCR Screening     Status: None   Collection Time: 03/03/20  7:00 PM   Specimen: Nasopharyngeal  Result Value Ref Range Status   MRSA by PCR NEGATIVE NEGATIVE Final    Comment:        The GeneXpert MRSA Assay (FDA approved for NASAL specimens only), is one component of a comprehensive MRSA colonization surveillance program. It is not intended to diagnose MRSA infection nor to guide or monitor treatment for MRSA infections. Performed at [facility], [address]  [place], [place] [address]   Culture, blood (Routine X 2) w Reflex to ID Panel     Status: None (Preliminary result)    Collection Time: 03/04/20  3:22 PM   Specimen: BLOOD RIGHT HAND  Result Value Ref Range Status   Specimen Description BLOOD RIGHT HAND  Final   Special Requests   Final    BOTTLES DRAWN AEROBIC AND ANAEROBIC Blood Culture adequate volume   Culture   Final    NO GROWTH 2 DAYS Performed at [facility], [address] [place], [place] [address]    Report Status PENDING  Incomplete  Culture, blood (Routine X 2) w Reflex to ID Panel     Status: None (Preliminary result)   Collection Time: 03/04/20  3:30 PM   Specimen: BLOOD LEFT HAND  Result Value Ref Range Status   Specimen Description BLOOD LEFT HAND  Final   Special Requests   Final    BOTTLES DRAWN AEROBIC AND ANAEROBIC Blood Culture results may not be optimal due to an inadequate volume of blood received in culture bottles   Culture   Final    NO GROWTH 2 DAYS Performed at [facility], [address] [place], [place] [address]    Report Status PENDING  Incomplete    Rexene AlbertsStephanie Keeya Dyckman, MSN, NP-C [facility]  [email or web address] Pager: [phone] Office: [phone] [facility] Main Line: [phone]

## 2020-03-06 NOTE — Transfer of Care (Signed)
Immediate Anesthesia Transfer of Care Note  Patient: [name]  Procedure(s) Performed: LEFT GREAT TOE AMPUTATION (Left )  Patient Location: PACU  Anesthesia Type:General  Level of Consciousness: awake, alert  and oriented  Airway & Oxygen Therapy: Patient Spontanous Breathing  Post-op Assessment: Report given to RN  Post vital signs: Reviewed and stable  Last Vitals:  Vitals Value Taken Time  BP 136/83 03/06/20 1356  Temp    Pulse 95 03/06/20 1357  Resp 14 03/06/20 1357  SpO2 98 % 03/06/20 1357  Vitals shown include unvalidated device data.  Last Pain:  Vitals:   03/06/20 0732  TempSrc:   PainSc: 0-No pain         Complications: No complications documented.

## 2020-03-06 NOTE — Anesthesia Postprocedure Evaluation (Signed)
Anesthesia Post Note  Patient: [name]  Procedure(s) Performed: LEFT GREAT TOE AMPUTATION (Left )     Patient location during evaluation: PACU Anesthesia Type: General Level of consciousness: awake and alert Pain management: pain level controlled Vital Signs Assessment: post-procedure vital signs reviewed and stable Respiratory status: spontaneous breathing, nonlabored ventilation, respiratory function stable and patient connected to nasal cannula oxygen Cardiovascular status: blood pressure returned to baseline and stable Postop Assessment: no apparent nausea or vomiting Anesthetic complications: no   No complications documented.  Last Vitals:  Vitals:   03/06/20 1513 03/06/20 1630  BP: 135/77 126/76  Pulse: 83 79  Resp: 17 20  Temp: 36.6 C 36.4 C  SpO2: 99%     Last Pain:  Vitals:   03/06/20 1630  TempSrc: Oral  PainSc: 0-No pain                 Tallie Hevia DAVID

## 2020-03-06 NOTE — Progress Notes (Addendum)
PHARMACY - PHYSICIAN COMMUNICATION CRITICAL VALUE ALERT - BLOOD CULTURE IDENTIFICATION (BCID)  [name] is an 39 y.o. male who presented to [facility] on 03/02/2020 with a chief complaint of R and L toe cellulitis  Assessment:  BCx from 10/9 showing 2/4 bottles with Gm (+) cocci and 1/4 bottles with Gm (-) rods (in anaerobic bottle). BCID did not identify any organism.  Repeat BCx from 10/11 are still ngtd  Name of physician (or Provider) Contacted: Margarite Gouge, PharmD and Eliseo Gum, MD  Current antibiotics: penicillin G 12 million units q12h   Changes to prescribed antibiotics recommended:  Patient switched to ceftriaxone due to new growth of GNR   Results for orders placed or performed during the hospital encounter of 03/02/20  Blood Culture ID Panel (Reflexed) (Collected: 03/02/2020  6:54 PM)  Result Value Ref Range   Enterococcus faecalis NOT DETECTED NOT DETECTED   Enterococcus Faecium NOT DETECTED NOT DETECTED   Listeria monocytogenes NOT DETECTED NOT DETECTED   Staphylococcus species NOT DETECTED NOT DETECTED   Staphylococcus aureus (BCID) NOT DETECTED NOT DETECTED   Staphylococcus epidermidis NOT DETECTED NOT DETECTED   Staphylococcus lugdunensis NOT DETECTED NOT DETECTED   Streptococcus species NOT DETECTED NOT DETECTED   Streptococcus agalactiae NOT DETECTED NOT DETECTED   Streptococcus pneumoniae NOT DETECTED NOT DETECTED   Streptococcus pyogenes NOT DETECTED NOT DETECTED   A.calcoaceticus-baumannii NOT DETECTED NOT DETECTED   Bacteroides fragilis NOT DETECTED NOT DETECTED   Enterobacterales NOT DETECTED NOT DETECTED   Enterobacter cloacae complex NOT DETECTED NOT DETECTED   Escherichia coli NOT DETECTED NOT DETECTED   Klebsiella aerogenes NOT DETECTED NOT DETECTED   Klebsiella oxytoca NOT DETECTED NOT DETECTED   Klebsiella pneumoniae NOT DETECTED NOT DETECTED   Proteus species NOT DETECTED NOT DETECTED   Salmonella species NOT DETECTED NOT DETECTED    Serratia marcescens NOT DETECTED NOT DETECTED   Haemophilus influenzae NOT DETECTED NOT DETECTED   Neisseria meningitidis NOT DETECTED NOT DETECTED   Pseudomonas aeruginosa NOT DETECTED NOT DETECTED   Stenotrophomonas maltophilia NOT DETECTED NOT DETECTED   Candida albicans NOT DETECTED NOT DETECTED   Candida auris NOT DETECTED NOT DETECTED   Candida glabrata NOT DETECTED NOT DETECTED   Candida krusei NOT DETECTED NOT DETECTED   Candida parapsilosis NOT DETECTED NOT DETECTED   Candida tropicalis NOT DETECTED NOT DETECTED   Cryptococcus neoformans/gattii NOT DETECTED NOT DETECTED   Rexford Maus, PharmD PGY-1 Acute Care Pharmacy Resident Office: [phone] 03/06/2020 8:53 AM

## 2020-03-06 NOTE — TOC Progression Note (Addendum)
Transition of Care [facility]) - Progression Note    Patient Details  Name: KAYEN GRABEL MRN: [id number] Date of Birth: [birth year]  Transition of Care [facility]) CM/SW Contact  Leone Haven, RN Phone Number: 03/06/2020, 4:42 PM  Clinical Narrative:    Patient is going to need 6 weeks of iv abx, he has no insurance, NCM made referral to [facility] with [facility] for charity RN, and also to [employer] with [employer] .  Awaiting to hear back from [place] with [facility].  NCM spoke with him and wife at bedside to see if he wanted to go to [facility] clinic for hospital follow up.  They both said yes.  NCM made follow up with [facility] cllinic for 11/2.    10/14- NCM spoke with ID and informed that patient does not meet charity guidelines, and he states he can not afford to pay for [facility] with IV meds.  ID will plan to do oritivancin for patient , so he will not need to go home with any iv abx at dc. He will follow up with ID outpatient.       Expected Discharge Plan and Services                                                 Social Determinants of Health (SDOH) Interventions    Readmission Risk Interventions No flowsheet data found.

## 2020-03-07 ENCOUNTER — Encounter (HOSPITAL_COMMUNITY): Payer: Self-pay | Admitting: Orthopedic Surgery

## 2020-03-07 ENCOUNTER — Inpatient Hospital Stay: Payer: Self-pay

## 2020-03-07 DIAGNOSIS — R7881 Bacteremia: Secondary | ICD-10-CM

## 2020-03-07 LAB — CBC WITH DIFFERENTIAL/PLATELET
Abs Immature Granulocytes: 0.07 10*3/uL (ref 0.00–0.07)
Basophils Absolute: 0.1 10*3/uL (ref 0.0–0.1)
Basophils Relative: 1 %
Eosinophils Absolute: 0.1 10*3/uL (ref 0.0–0.5)
Eosinophils Relative: 1 %
HCT: 39.6 % (ref 39.0–52.0)
Hemoglobin: 12.7 g/dL — ABNORMAL LOW (ref 13.0–17.0)
Immature Granulocytes: 1 %
Lymphocytes Relative: 25 %
Lymphs Abs: 1.8 10*3/uL (ref 0.7–4.0)
MCH: 26.8 pg (ref 26.0–34.0)
MCHC: 32.1 g/dL (ref 30.0–36.0)
MCV: 83.5 fL (ref 80.0–100.0)
Monocytes Absolute: 0.7 10*3/uL (ref 0.1–1.0)
Monocytes Relative: 9 %
Neutro Abs: 4.8 10*3/uL (ref 1.7–7.7)
Neutrophils Relative %: 63 %
Platelets: 322 10*3/uL (ref 150–400)
RBC: 4.74 MIL/uL (ref 4.22–5.81)
RDW: 13.7 % (ref 11.5–15.5)
WBC: 7.4 10*3/uL (ref 4.0–10.5)
nRBC: 0 % (ref 0.0–0.2)

## 2020-03-07 LAB — GLUCOSE, CAPILLARY
Glucose-Capillary: 170 mg/dL — ABNORMAL HIGH (ref 70–99)
Glucose-Capillary: 185 mg/dL — ABNORMAL HIGH (ref 70–99)
Glucose-Capillary: 229 mg/dL — ABNORMAL HIGH (ref 70–99)
Glucose-Capillary: 203 mg/dL — ABNORMAL HIGH (ref 70–99)

## 2020-03-07 LAB — BASIC METABOLIC PANEL
Anion gap: 7 (ref 5–15)
BUN: <5 mg/dL — ABNORMAL LOW (ref 6–20)
CO2: 26 mmol/L (ref 22–32)
Calcium: 8.4 mg/dL — ABNORMAL LOW (ref 8.9–10.3)
Chloride: 104 mmol/L (ref 98–111)
Creatinine, Ser: 0.55 mg/dL — ABNORMAL LOW (ref 0.61–1.24)
GFR, Estimated: >60 mL/min (ref >60)
Glucose, Bld: 183 mg/dL — ABNORMAL HIGH (ref 70–99)
Potassium: 3.8 mmol/L (ref 3.5–5.1)
Sodium: 137 mmol/L (ref 135–145)

## 2020-03-07 NOTE — Progress Notes (Signed)
Subjective: Patient doing well this AM. He reports that his amputation site pain is intermittent and overall well controlled. He notes that his R toe has decreased swelling and drainage. Patient does not report chest pain or new SOB.  Objective:  Vital signs in last 24 hours: Vitals:   03/07/20 0400 03/07/20 0417 03/07/20 0600 03/07/20 0746  BP:  135/86  126/78  Pulse:  92 87 89  Resp: 19 19 (!) 23 19  Temp:  98.2 F (36.8 C)  98.2 F (36.8 C)  TempSrc:  Oral  Oral  SpO2:  96% 96% 97%  Weight:   (!) 144.4 kg   Height:       Physical Exam HENT:     Head: Normocephalic and atraumatic.  Cardiovascular:     Rate and Rhythm: Normal rate and regular rhythm.  Pulmonary:     Effort: Pulmonary effort is normal.     Breath sounds: Normal breath sounds.  Abdominal:     General: Bowel sounds are normal.     Palpations: Abdomen is soft.     Tenderness: There is no abdominal tenderness.  Skin:    Comments: L foot is wrapped in post-op dressing. Appears clean at this time.  R large to has decreased edema, Mild erythema and no drainage coming from ulceration. Patient has decent ROM in the large toe.   Patient has no erythema in bilateral lower extremities. Mild tenderness to palpation of the BLE.  Neurological:     General: No focal deficit present.     Mental Status: He is alert.  Psychiatric:        Mood and Affect: Mood normal.     Assessment/Plan:  Active Problems:   Cellulitis of left toe   Diabetic ketoacidosis (HCC)   DKA (diabetic ketoacidosis) (HCC)   Sepsis with acute renal failure without septic shock (HCC)   Abscess of great toe, left   Major depressive disorder, recurrent episode, moderate (HCC)   Osteomyelitis of left foot (HCC)   Sepsis due to group B Streptococcus with acute renal failure [facility])  [name] is a 39 y.o. 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  # Sepsis in setting ofLLE Cellulitis due to Infected Diabetic Foot Wound He has remained afebrile and without leukocytosis that continues to have downward trend. His pain is well controlled on his current regimen. He denies fevers, chills or any systemic findings. Patient Bcx has shown GNR. Per ID patient PCN G should be discontinued and patient will start Unasyn until specificity can be obtained. - Discontinue PCN G - Start Unasyn 6 weeks - PICC line placement today - Wound Care Consulted  - Daily CBC, CMP   #Hyperglycemia w/ uncontrolled T2DM presented with Mild DKA  CBGs170< <196<<276 -Lantus45 U SAI 0-15 TID w/ SAI 0-5 U qh w/ moderate Sliding scale -Carb modified diet - Tylenol 650 q6hrs PRN for pain   # Penile rash Patient reports everything is "fine." - Nystatin BID   # Mood disorder concern for Passive vs Active SI at presentation - TOC consulted for PCP needs - Continue Zoloft 25mg    # Acute Kidney Injury-resolved - Daily weights, Daily Renal Panel, Strict I/Os, Avoid nephrotoxins (NSAIDs, judicious IV Contrast)  Medication Issues; ? Preferred narcotic agents for pain control are hydromorphone, fentanyl, and methadone. Morphine should not be used.  ?  Baclofen should be avoided ? Avoid oral sodium phosphate and magnesium citrate based laxatives / bowel preps    # Hypertension Bp this am 126/78 - Continue to hold lisinopril 20mg  -Continue to monitor   # Hyperlipidemia Previously on Lipitor 20mg  daily. Please resume when appropriate as patient is diabetic and 39 years old - Follow up outpatient  FEN: Carb modified VTE ppx:SQ Heparin CODE STATUS:FULL  Prior to Admission Living Arrangement:Home Anticipated Discharge Location:Home Barriers to Discharge:Treatment Dispo: Anticipated discharge in  approximately2-3day(s).     , MD 03/07/2020, 9:55 AM Pager: [phone] After 5pm on weekdays and 1pm on weekends: On Call pager [phone]

## 2020-03-07 NOTE — Progress Notes (Signed)
Inpatient Diabetes Program Recommendations  AACE/ADA: New Consensus Statement on Inpatient Glycemic Control (2015)  Target Ranges:  Prepandial:   less than 140 mg/dL      Peak postprandial:   less than 180 mg/dL (1-2 hours)      Critically ill patients:  140 - 180 mg/dL   Lab Results  Component Value Date   GLUCAP 170 (H) 03/07/2020   HGBA1C 11.8 (H) 03/03/2020    Review of Glycemic Control  Results for DATRELL, DUNTON (MRN [id number]) as of 03/07/2020 10:36  Ref. Range 03/06/2020 14:07 03/06/2020 16:15 03/06/2020 21:29 03/07/2020 06:12  Glucose-Capillary Latest Ref Range: 70 - 99 mg/dL 771 (H) 165 (H) 790 (H) 170 (H)   Diabetes history:DM 2 Outpatient Diabetes medications:Glipizide and metformin in the past Current orders for Inpatient glycemic control:Lantus 45 units QD, Novolog 0-15 units TID, Novolog 0-5 units QHS  A1c 11.8% this admission  Inpatient Diabetes Program Recommendations:  Consider increasing Lantus to 50 units QD. If post prandials exceed inpatient goals of >180 mg/dL consider adding Novolog 4 units TID (assuming patient is consuming >50% of meals and following procedure).  Following for disposition plan.  Thanks, Lujean Rave, MSN, RNC-OB Diabetes Coordinator [phone] (8a-5p)

## 2020-03-07 NOTE — Progress Notes (Signed)
Patient ID: [name], male   DOB: [birth year], 39 y.o.   MRN: [id number] Patient is postop day 1 amputation left great toe.  He is sitting up in bed and comfortable the dressing is dry.  Recommended ideally nonweightbearing on the left foot weightbearing on the heel is acceptable if he needs this for balance to prevent falling.  I will follow-up in the office in 1 week.

## 2020-03-07 NOTE — Progress Notes (Addendum)
Notified by case management that he does not qualify for charity care and unable to afford the home health nurse care / medications.   D/W Dr. Daiva Eves and Dr. Heide Spark - will plan for a dose of oritavancin in AM and see him in 2 weeks outpatient to consider ongoing oral augmentin thereafter.   While he was bacteremic due to this infection, fortunately he improved quickly and has had nearly 1 week of IV therapy already. His contralateral foot wound has improved significantly also.   Will cancel picc line and arrange appropriate follow up.    Rexene Alberts, MSN, NP-C [facility] for Infectious Disease [facility]  [email or web address] Pager: [phone] Office: [phone] [facility] Main Line: [phone]   We will also supply the patient with flagyl to be taken until his visit with [facility].

## 2020-03-07 NOTE — Evaluation (Signed)
Physical Therapy Evaluation Patient Details Name: [name] MRN: [id number] DOB: 03/05/1981 Today's Date: 03/07/2020   History of Present Illness  [name] is a 39 y.o. male who presents with uncontrolled type 2 diabetes patient states that the left great toe has started swelling more now with purulent drainage after starting his oral doxycycline.  Underwent a LEFT GREAT TOE AMPUTATION (Left).  Clinical Impression  Pt admitted with above diagnosis and presents to PT with functional limitations due to deficits listed below (See PT problem list). Pt needs skilled PT to maximize independence and safety to allow discharge to home with family. Reviewed to minimize time up on feet and to only weight bear through left heal for balance.      Follow Up Recommendations No PT follow up    Equipment Recommendations  Rolling walker with 5" wheels    Recommendations for Other Services       Precautions / Restrictions Precautions Precautions: Fall Restrictions Weight Bearing Restrictions: Yes LLE Weight Bearing: Weight bearing as tolerated Other Position/Activity Restrictions: Allowed to WB through L heal for balance per MD note      Mobility  Bed Mobility Overal bed mobility: Independent                Transfers Overall transfer level: Needs assistance Equipment used: Rolling walker (2 wheeled) Transfers: Sit to/from Stand Sit to Stand: Supervision         General transfer comment: Needed 2 attempts to rise from surface but no physical assistance  Ambulation/Gait Ambulation/Gait assistance: Supervision Gait Distance (Feet): 100 Feet Assistive device: Rolling walker (2 wheeled) Gait Pattern/deviations: Step-to pattern;Decreased stance time - left;Decreased step length - right;Decreased step length - left Gait velocity: decr Gait velocity interpretation: <1.31 ft/sec, indicative of household ambulator General Gait Details: Verbal cues to only bear  weight through heal and to place weight through hands  Stairs            Wheelchair Mobility    Modified Rankin (Stroke Patients Only)       Balance Overall balance assessment: No apparent balance deficits (not formally assessed)                                           Pertinent Vitals/Pain Pain Assessment: No/denies pain Pain Score: 2  Pain Descriptors / Indicators: Discomfort;Tightness Pain Intervention(s): Monitored during session;Repositioned    Home Living Family/patient expects to be discharged to:: Private residence Living Arrangements: Parent Available Help at Discharge: Family Type of Home: House Home Access: Stairs to enter Entrance Stairs-Rails: Can reach both Entrance Stairs-Number of Steps: 4 to 5 Home Layout: One level Home Equipment: None      Prior Function Level of Independence: Independent               Hand Dominance   Dominant Hand: Right    Extremity/Trunk Assessment   Upper Extremity Assessment Upper Extremity Assessment: Defer to OT evaluation    Lower Extremity Assessment Lower Extremity Assessment: LLE deficits/detail LLE Deficits / Details: great toe amputation    Cervical / Trunk Assessment Cervical / Trunk Assessment: Normal  Communication   Communication: No difficulties  Cognition Arousal/Alertness: Awake/alert Behavior During Therapy: WFL for tasks assessed/performed Overall Cognitive Status: Within Functional Limits for tasks assessed  General Comments: Started on Zoloft 4 days ago for ongiong depression.      General Comments      Exercises     Assessment/Plan    PT Assessment Patient needs continued PT services  PT Problem List Decreased mobility;Decreased knowledge of precautions       PT Treatment Interventions DME instruction;Gait training;Stair training;Functional mobility training;Therapeutic activities;Patient/family education     PT Goals (Current goals can be found in the Care Plan section)  Acute Rehab PT Goals Patient Stated Goal: I'd like to get home PT Goal Formulation: With patient Time For Goal Achievement: 03/14/20 Potential to Achieve Goals: Good    Frequency Min 3X/week   Barriers to discharge Inaccessible home environment stairs to enter    Co-evaluation               AM-PAC PT "6 Clicks" Mobility  Outcome Measure Help needed turning from your back to your side while in a flat bed without using bedrails?: None Help needed moving from lying on your back to sitting on the side of a flat bed without using bedrails?: None Help needed moving to and from a bed to a chair (including a wheelchair)?: A Little Help needed standing up from a chair using your arms (e.g., wheelchair or bedside chair)?: A Little Help needed to walk in hospital room?: A Little Help needed climbing 3-5 steps with a railing? : A Little 6 Click Score: 20    End of Session   Activity Tolerance: Patient tolerated treatment well Patient left: in bed;with call bell/phone within reach   PT Visit Diagnosis: Other abnormalities of gait and mobility (R26.89)    Time: 8299-3716 PT Time Calculation (min) (ACUTE ONLY): 14 min   Charges:   PT Evaluation $PT Eval Low Complexity: 1 Low          [facility] Acute Rehabilitation Services Pager [phone] Office [phone]   [name] [facility] 03/07/2020, 12:33 PM

## 2020-03-07 NOTE — Plan of Care (Signed)

## 2020-03-07 NOTE — Evaluation (Signed)
Occupational Therapy Evaluation Patient Details Name: [name] MRN: [id number] DOB: [birth year] Today's Date: 03/07/2020    History of Present Illness [name] is a 39 y.o. male who presents with uncontrolled type 2 diabetes patient states that the left great toe has started swelling more now with purulent drainage after starting his oral doxycycline.  Underwent a LEFT GREAT TOE AMPUTATION (Left).   Clinical Impression   Patient admitted for the above diagnosis.  Presents with mild discomfort to L foot, and mild unsteadiness at RW level.  Patient just needs additional practice with RW and cueing on best practices to maintain NWB status.  OT discussed ADL from seated position for safety and compliance.  Patient demonstrated good follow through.  OT recommended a shower seat in the near future when cleared by MD for showers.  PT eval is pending.  No further OT needs in the acute setting.  HH has been ordered for PICC and wound management.      Follow Up Recommendations  Supervision - Intermittent;No OT follow up    Equipment Recommendations  Tub/shower seat;Other (comment) (when cleared for showers by MD)    Recommendations for Other Services       Precautions / Restrictions Precautions Precautions: Fall Restrictions Weight Bearing Restrictions: Yes LLE Weight Bearing: Non weight bearing Other Position/Activity Restrictions: Allowed to WB through L heal for balance.      Mobility Bed Mobility Overal bed mobility: Independent                Transfers Overall transfer level: Modified independent Equipment used: Rolling walker (2 wheeled)             General transfer comment: Will need reinforcement of WB status during transitional movements and RW management.    Balance Overall balance assessment: No apparent balance deficits (not formally assessed)                                         ADL either performed or assessed with  clinical judgement   ADL Overall ADL's : Modified independent                                       General ADL Comments: Educated on bathing/dressing from a seated position and standing at [facility] for pant management or peri care.     Vision Baseline Vision/History: No visual deficits Patient Visual Report: No change from baseline       Perception     Praxis      Pertinent Vitals/Pain Pain Assessment: 0-10 Pain Score: 2  Pain Descriptors / Indicators: Discomfort;Tightness Pain Intervention(s): Monitored during session;Repositioned     Hand Dominance Right   Extremity/Trunk Assessment Upper Extremity Assessment Upper Extremity Assessment: Overall WFL for tasks assessed   Lower Extremity Assessment Lower Extremity Assessment: Defer to PT evaluation   Cervical / Trunk Assessment Cervical / Trunk Assessment: Normal   Communication Communication Communication: No difficulties   Cognition Arousal/Alertness: Awake/alert Behavior During Therapy: WFL for tasks assessed/performed Overall Cognitive Status: Within Functional Limits for tasks assessed                                 General Comments: Started on Zoloft 4 days ago for ongiong  depression.   General Comments                  Home Living Family/patient expects to be discharged to:: Private residence Living Arrangements: Parent Available Help at Discharge: Family Type of Home: House Home Access: Stairs to enter Secretary/administrator of Steps: 4 to 5 Entrance Stairs-Rails: Can reach both Home Layout: One level     Bathroom Shower/Tub: Producer, television/film/video: Standard Bathroom Accessibility: Yes How Accessible: Accessible via walker Home Equipment: None          Prior Functioning/Environment Level of Independence: Independent                 OT Problem List: Other (comment);Decreased safety awareness (decreased RW management.)      OT  Treatment/Interventions:      OT Goals(Current goals can be found in the care plan section) Acute Rehab OT Goals Patient Stated Goal: I'd like to get home OT Goal Formulation: With patient Time For Goal Achievement: 03/11/20 Potential to Achieve Goals: Good  OT Frequency:     Barriers to D/C:  none          Co-evaluation              AM-PAC OT "6 Clicks" Daily Activity     Outcome Measure Help from another person eating meals?: None Help from another person taking care of personal grooming?: None Help from another person toileting, which includes using toliet, bedpan, or urinal?: None Help from another person bathing (including washing, rinsing, drying)?: None Help from another person to put on and taking off regular upper body clothing?: None Help from another person to put on and taking off regular lower body clothing?: None 6 Click Score: 24   End of Session Equipment Utilized During Treatment: Rolling walker Nurse Communication: Mobility status  Activity Tolerance: Patient tolerated treatment well Patient left: in bed;with call bell/phone within reach;with nursing/sitter in room  OT Visit Diagnosis: Pain Pain - Right/Left: Left Pain - part of body: Ankle and joints of foot                Time: 9163-8466 OT Time Calculation (min): 22 min Charges:  OT General Charges $OT Visit: 1 Visit OT Evaluation $OT Eval Moderate Complexity: 1 Mod  03/07/2020  Rich, OTR/L  Acute Rehabilitation Services  Office:  [phone]   Suzanna Obey 03/07/2020, 11:27 AM

## 2020-03-07 NOTE — Progress Notes (Signed)
[facility] for Infectious Disease  Date of Admission:  03/02/2020      Total days of antibiotics 6  Unasyn day 2          ASSESSMENT: Mr. Eckardt is now POD1 following amputation of the left great toe. Op note reviewed and no abscess with healthy margins at the level of amputation. Right toe is looking much improved as well. Will continue plan for PICC line to complete 4-6 weeks IV therapy. Will see how his right foot wound looks after 4 weeks given significant improvement over the last 6 days on treatment.   Gram negative anaerobic rod ID still pending. Wil l plan for continuous IV Penicillin pump + oral flagyl at discharge, presuming the anaerobe is not sensitive to beta-lactam. Will follow micro.  Follow up appt with ID on 11/18 @ 3:45 pm.    Discussed importance of glycemic control, offloading and proper wound care to help with healing current infection and prevention of future ones.     PLAN: 1. Continue unasyn for now  2. Plan for D/C on continuous IV PCN + oral metronidazole 500 mg TID PO 3. Please place PICC line in right arm for patient preference.  4. 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 Milk-Related Compounds Diarrhea  . Iodine     Patient endorses unspecified reaction to iodine in past     Discharge antibiotics to be given via PICC line:  Per pharmacy protocol continuous PCN infusion + oral metronidazole 500 mg TID   Duration: 6 weeks   End Date: Nov 20th 2021   [facility] Care Per Protocol with [employer] Use: Home health RN for IV administration and teaching, line care and labs.    Labs weekly while on IV antibiotics: _x_ CBC with differential _x_ BMP __ CMP _x_ CRP _x_ ESR __ Vancomycin trough __ CK  _x_  Please pull PIC at completion of IV antibiotics __ Please leave PIC in place until doctor has seen patient or been notified  Fax weekly labs to [phone]  Clinic Follow Up Appt: November 18th @ 3:45 pm with Janene Madeira, NP      Active Problems:   Cellulitis of left toe   Abscess of great toe, left   Osteomyelitis of left foot (HCC)   Sepsis due to group B Streptococcus with acute renal failure (HCC)   Anaerobic bacteremia   Diabetic ketoacidosis ([place])   DKA (diabetic ketoacidosis) ([place])   Sepsis with acute renal failure without septic shock ([place])   Major depressive disorder, recurrent episode, moderate ([place])   . docusate sodium  100 mg Oral BID  . heparin  5,000 Units Subcutaneous Q8H  . insulin aspart  0-15 Units Subcutaneous TID WC  . insulin aspart  0-5 Units Subcutaneous QHS  . insulin glargine  45 Units Subcutaneous Daily  . nystatin cream   Topical BID  . sertraline  25 mg Oral Daily    SUBJECTIVE: Doing OK today. Surgery went well. Says he is getting a boot for the foot to help keep weight off it. No fevers/chills. Would like to get PICC in right arm if possible.    Review of Systems: Review of Systems  Constitutional: Negative for chills and fever.  Respiratory: Negative for cough and shortness of breath.   Cardiovascular: Negative for chest pain and palpitations.  Genitourinary: Negative for dysuria.  Musculoskeletal: Negative for back pain and joint pain.    Allergies  Allergen Reactions  . Milk-Related Compounds Diarrhea  . Iodine     Patient endorses unspecified reaction to iodine in past    OBJECTIVE: Vitals:   03/07/20 0400 03/07/20 0417 03/07/20 0600 03/07/20 0746  BP:  135/86  126/78  Pulse:  92 87 89  Resp: 19 19 (!) 23 19  Temp:  98.2 F (36.8 C)  98.2 F (36.8 C)  TempSrc:  Oral  Oral  SpO2:  96% 96% 97%  Weight:   (!) 144.4 kg   Height:       Body mass index is 42 kg/m.   Physical Exam Constitutional:       Appearance: He is well-developed.     Comments: Resting comfortably in bed   HENT:     Mouth/Throat:     Dentition: Normal dentition. No dental abscesses.  Cardiovascular:     Rate and Rhythm: Normal rate and regular rhythm.     Heart sounds: Normal heart sounds.  Pulmonary:     Effort: Pulmonary effort is normal.     Breath sounds: Normal breath sounds.  Abdominal:     General: There is no distension.     Palpations: Abdomen is soft.     Tenderness: There is no abdominal tenderness.  Musculoskeletal:     Comments: Right toe significantly improved today in size. Wound is healing up nicely and dry.   Lymphadenopathy:     Cervical: No cervical adenopathy.  Skin:    General: Skin is warm and dry.     Findings: No rash.  Neurological:     Mental Status: He is alert and oriented to person, place, and time.  Psychiatric:        Judgment: Judgment normal.     Lab Results Lab Results  Component Value Date   WBC 7.4 03/07/2020   HGB 12.7 (L) 03/07/2020   HCT 39.6 03/07/2020   MCV 83.5 03/07/2020   PLT 322 03/07/2020    Lab Results  Component Value Date   CREATININE 0.55 (L) 03/07/2020   BUN <5 (L) 03/07/2020   NA 137 03/07/2020   K 3.8 03/07/2020   CL 104 03/07/2020   CO2 26 03/07/2020    Lab Results  Component Value Date   ALT 11 03/03/2020   AST 13 (L) 03/03/2020   ALKPHOS 66 03/03/2020   BILITOT 0.8 03/03/2020     Microbiology: Recent Results (from the past 240 hour(s))  Culture, blood (Routine x 2)     Status: Abnormal (Preliminary result)   Collection Time: 03/02/20  6:54 PM   Specimen: BLOOD  Result Value Ref Range Status   Specimen Description BLOOD LEFT ANTECUBITAL  Final   Special Requests   Final    BOTTLES DRAWN AEROBIC AND ANAEROBIC Blood Culture adequate volume   Culture  Setup Time   Final    GRAM POSITIVE COCCI IN CHAINS AEROBIC BOTTLE ONLY CRITICAL VALUE NOTED.  VALUE IS CONSISTENT WITH PREVIOUSLY REPORTED AND CALLED VALUE. GRAM NEGATIVE  RODS ANAEROBIC BOTTLE ONLY CRITICAL RESULT CALLED TO, READ BACK BY AND VERIFIED WITH: [place] [phone] 03/06/20    Culture (A)  Final    GROUP B STREP(S.AGALACTIAE)ISOLATED SUSCEPTIBILITIES PERFORMED ON PREVIOUS CULTURE WITHIN THE LAST 5 DAYS. HOLDING FOR POSSIBLE ANAEROBE Performed at [facility]  [facility], [address], [place], [place] [address]    Report Status PENDING  Incomplete  Blood Culture ID Panel (Reflexed)     Status: None   Collection Time: 03/02/20  6:54 PM  Result Value Ref Range Status   Enterococcus faecalis NOT DETECTED NOT DETECTED Final   Enterococcus Faecium NOT DETECTED NOT DETECTED Final   Listeria monocytogenes NOT DETECTED NOT DETECTED Final   Staphylococcus species NOT DETECTED NOT DETECTED Final   Staphylococcus aureus (BCID) NOT DETECTED NOT DETECTED Final   Staphylococcus epidermidis NOT DETECTED NOT DETECTED Final   Staphylococcus lugdunensis NOT DETECTED NOT DETECTED Final   Streptococcus species NOT DETECTED NOT DETECTED Final   Streptococcus agalactiae NOT DETECTED NOT DETECTED Final   Streptococcus pneumoniae NOT DETECTED NOT DETECTED Final   Streptococcus pyogenes NOT DETECTED NOT DETECTED Final   A.calcoaceticus-baumannii NOT DETECTED NOT DETECTED Final   Bacteroides fragilis NOT DETECTED NOT DETECTED Final   Enterobacterales NOT DETECTED NOT DETECTED Final   Enterobacter cloacae complex NOT DETECTED NOT DETECTED Final   Escherichia coli NOT DETECTED NOT DETECTED Final   Klebsiella aerogenes NOT DETECTED NOT DETECTED Final   Klebsiella oxytoca NOT DETECTED NOT DETECTED Final   Klebsiella pneumoniae NOT DETECTED NOT DETECTED Final   Proteus species NOT DETECTED NOT DETECTED Final   Salmonella species NOT DETECTED NOT DETECTED Final   Serratia marcescens NOT DETECTED NOT DETECTED Final   Haemophilus influenzae NOT DETECTED NOT DETECTED Final   Neisseria meningitidis NOT DETECTED NOT DETECTED Final   Pseudomonas aeruginosa NOT DETECTED NOT  DETECTED Final   Stenotrophomonas maltophilia NOT DETECTED NOT DETECTED Final   Candida albicans NOT DETECTED NOT DETECTED Final   Candida auris NOT DETECTED NOT DETECTED Final   Candida glabrata NOT DETECTED NOT DETECTED Final   Candida krusei NOT DETECTED NOT DETECTED Final   Candida parapsilosis NOT DETECTED NOT DETECTED Final   Candida tropicalis NOT DETECTED NOT DETECTED Final   Cryptococcus neoformans/gattii NOT DETECTED NOT DETECTED Final    Comment: Performed at [facility], [address] [place], [place] [address]  Culture, blood (Routine x 2)     Status: Abnormal   Collection Time: 03/02/20  8:10 PM   Specimen: BLOOD  Result Value Ref Range Status   Specimen Description BLOOD SITE NOT SPECIFIED  Final   Special Requests   Final    BOTTLES DRAWN AEROBIC AND ANAEROBIC Blood Culture results may not be optimal due to an inadequate volume of blood received in culture bottles   Culture  Setup Time   Final    GRAM POSITIVE COCCI IN CHAINS ANAEROBIC BOTTLE ONLY CRITICAL RESULT CALLED TO, READ BACK BY AND VERIFIED WITH: PHARMD C AMEND [id number] MLM Performed at [facility], [place] [address] [place], [place] [address]    Culture GROUP B STREP(S.AGALACTIAE)ISOLATED (A)  Final   Report Status 03/05/2020 FINAL  Final   Organism ID, Bacteria GROUP B STREP(S.AGALACTIAE)ISOLATED  Final      Susceptibility   Group b strep(s.agalactiae)isolated - MIC*    CLINDAMYCIN >=1 RESISTANT Resistant     AMPICILLIN <=0.25 SENSITIVE Sensitive     ERYTHROMYCIN >=8 RESISTANT Resistant     VANCOMYCIN 0.5 SENSITIVE Sensitive     CEFTRIAXONE <=0.12 SENSITIVE Sensitive     LEVOFLOXACIN 1 SENSITIVE Sensitive     PENICILLIN Value in next row Sensitive      SENSITIVE0.06    * GROUP B STREP(S.AGALACTIAE)ISOLATED  Blood Culture ID Panel (Reflexed)  Status: Abnormal   Collection Time: 03/02/20  8:10 PM  Result Value Ref Range Status   Enterococcus faecalis NOT DETECTED NOT DETECTED Final     Enterococcus Faecium NOT DETECTED NOT DETECTED Final   Listeria monocytogenes NOT DETECTED NOT DETECTED Final   Staphylococcus species NOT DETECTED NOT DETECTED Final   Staphylococcus aureus (BCID) NOT DETECTED NOT DETECTED Final   Staphylococcus epidermidis NOT DETECTED NOT DETECTED Final   Staphylococcus lugdunensis NOT DETECTED NOT DETECTED Final   Streptococcus species DETECTED (A) NOT DETECTED Final    Comment: CRITICAL RESULT CALLED TO, READ BACK BY AND VERIFIED WITH: PHARMD C AMEND [phone] MLM    Streptococcus agalactiae DETECTED (A) NOT DETECTED Final    Comment: CRITICAL RESULT CALLED TO, READ BACK BY AND VERIFIED WITH: PHARMD C AMEND [id number] MLM    Streptococcus pneumoniae NOT DETECTED NOT DETECTED Final   Streptococcus pyogenes NOT DETECTED NOT DETECTED Final   A.calcoaceticus-baumannii NOT DETECTED NOT DETECTED Final   Bacteroides fragilis NOT DETECTED NOT DETECTED Final   Enterobacterales NOT DETECTED NOT DETECTED Final   Enterobacter cloacae complex NOT DETECTED NOT DETECTED Final   Escherichia coli NOT DETECTED NOT DETECTED Final   Klebsiella aerogenes NOT DETECTED NOT DETECTED Final   Klebsiella oxytoca NOT DETECTED NOT DETECTED Final   Klebsiella pneumoniae NOT DETECTED NOT DETECTED Final   Proteus species NOT DETECTED NOT DETECTED Final   Salmonella species NOT DETECTED NOT DETECTED Final   Serratia marcescens NOT DETECTED NOT DETECTED Final   Haemophilus influenzae NOT DETECTED NOT DETECTED Final   Neisseria meningitidis NOT DETECTED NOT DETECTED Final   Pseudomonas aeruginosa NOT DETECTED NOT DETECTED Final   Stenotrophomonas maltophilia NOT DETECTED NOT DETECTED Final   Candida albicans NOT DETECTED NOT DETECTED Final   Candida auris NOT DETECTED NOT DETECTED Final   Candida glabrata NOT DETECTED NOT DETECTED Final   Candida krusei NOT DETECTED NOT DETECTED Final   Candida parapsilosis NOT DETECTED NOT DETECTED Final   Candida tropicalis NOT  DETECTED NOT DETECTED Final   Cryptococcus neoformans/gattii NOT DETECTED NOT DETECTED Final    Comment: Performed at [facility], [address] [place], [place] [address]  Respiratory Panel by RT PCR (Flu A&B, Covid) - Nasopharyngeal Swab     Status: None   Collection Time: 03/02/20  9:09 PM   Specimen: Nasopharyngeal Swab  Result Value Ref Range Status   SARS Coronavirus 2 by RT PCR NEGATIVE NEGATIVE Final    Comment: (NOTE) SARS-CoV-2 target nucleic acids are NOT DETECTED.  The SARS-CoV-2 RNA is generally detectable in upper respiratoy specimens during the acute phase of infection. The lowest concentration of SARS-CoV-2 viral copies this assay can detect is 131 copies/mL. A negative result does not preclude SARS-Cov-2 infection and should not be used as the sole basis for treatment or other patient management decisions. A negative result may occur with  improper specimen collection/handling, submission of specimen other than nasopharyngeal swab, presence of viral mutation(s) within the areas targeted by this assay, and inadequate number of viral copies (<131 copies/mL). A negative result must be combined with clinical observations, patient history, and epidemiological information. The expected result is Negative.  Fact Sheet for Patients:  PinkCheek.be  Fact Sheet for Healthcare Providers:  GravelBags.it  This test is no t yet approved or cleared by the [place] FDA and  has been authorized for detection and/or diagnosis of SARS-CoV-2 by FDA under an Emergency Use Authorization (EUA). This EUA will remain  in  effect (meaning this test can be used) for the duration of the COVID-19 declaration under Section 564(b)(1) of the Act, 21 U.S.C. section 360bbb-3(b)(1), unless the authorization is terminated or revoked sooner.     Influenza A by PCR NEGATIVE NEGATIVE Final   Influenza B by PCR NEGATIVE NEGATIVE  Final    Comment: (NOTE) The Xpert Xpress SARS-CoV-2/FLU/RSV assay is intended as an aid in  the diagnosis of influenza from Nasopharyngeal swab specimens and  should not be used as a sole basis for treatment. Nasal washings and  aspirates are unacceptable for Xpert Xpress SARS-CoV-2/FLU/RSV  testing.  Fact Sheet for Patients: PinkCheek.be  Fact Sheet for Healthcare Providers: GravelBags.it  This test is not yet approved or cleared by the [place] FDA and  has been authorized for detection and/or diagnosis of SARS-CoV-2 by  FDA under an Emergency Use Authorization (EUA). This EUA will remain  in effect (meaning this test can be used) for the duration of the  Covid-19 declaration under Section 564(b)(1) of the Act, 21  U.S.C. section 360bbb-3(b)(1), unless the authorization is  terminated or revoked. Performed at [facility], [place] [address] [place], [place] [address]   MRSA PCR Screening     Status: None   Collection Time: 03/03/20  7:00 PM   Specimen: Nasopharyngeal  Result Value Ref Range Status   MRSA by PCR NEGATIVE NEGATIVE Final    Comment:        The GeneXpert MRSA Assay (FDA approved for NASAL specimens only), is one component of a comprehensive MRSA colonization surveillance program. It is not intended to diagnose MRSA infection nor to guide or monitor treatment for MRSA infections. Performed at [facility], [place] [address], [place], [place] [address]   Culture, blood (Routine X 2) w Reflex to ID Panel     Status: None (Preliminary result)   Collection Time: 03/04/20  3:22 PM   Specimen: BLOOD RIGHT HAND  Result Value Ref Range Status   Specimen Description BLOOD RIGHT HAND  Final   Special Requests   Final    BOTTLES DRAWN AEROBIC AND ANAEROBIC Blood Culture adequate volume   Culture   Final    NO GROWTH 2 DAYS Performed at [facility], [place] [address] [place], [place]  [address]    Report Status PENDING  Incomplete  Culture, blood (Routine X 2) w Reflex to ID Panel     Status: None (Preliminary result)   Collection Time: 03/04/20  3:30 PM   Specimen: BLOOD LEFT HAND  Result Value Ref Range Status   Specimen Description BLOOD LEFT HAND  Final   Special Requests   Final    BOTTLES DRAWN AEROBIC AND ANAEROBIC Blood Culture results may not be optimal due to an inadequate volume of blood received in culture bottles   Culture   Final    NO GROWTH 2 DAYS Performed at [facility], [place] [address], [place], [place] [address]    Report Status PENDING  Incomplete    Janene Madeira, MSN, NP-C [facility] for Infectious Disease [email or web address] Pager: [phone] Office: [phone] [place]: [phone]

## 2020-03-08 ENCOUNTER — Other Ambulatory Visit (HOSPITAL_COMMUNITY): Payer: Self-pay | Admitting: Internal Medicine

## 2020-03-08 LAB — CBC WITH DIFFERENTIAL/PLATELET
Abs Immature Granulocytes: 0.1 10*3/uL — ABNORMAL HIGH (ref 0.00–0.07)
Basophils Absolute: 0.1 10*3/uL (ref 0.0–0.1)
Basophils Relative: 1 %
Eosinophils Absolute: 0.1 10*3/uL (ref 0.0–0.5)
Eosinophils Relative: 1 %
HCT: 39.7 % (ref 39.0–52.0)
Hemoglobin: 12.9 g/dL — ABNORMAL LOW (ref 13.0–17.0)
Immature Granulocytes: 2 %
Lymphocytes Relative: 28 %
Lymphs Abs: 1.9 10*3/uL (ref 0.7–4.0)
MCH: 27.4 pg (ref 26.0–34.0)
MCHC: 32.5 g/dL (ref 30.0–36.0)
MCV: 84.3 fL (ref 80.0–100.0)
Monocytes Absolute: 0.7 10*3/uL (ref 0.1–1.0)
Monocytes Relative: 10 %
Neutro Abs: 3.9 10*3/uL (ref 1.7–7.7)
Neutrophils Relative %: 58 %
Platelets: 349 10*3/uL (ref 150–400)
RBC: 4.71 MIL/uL (ref 4.22–5.81)
RDW: 13.8 % (ref 11.5–15.5)
WBC: 6.8 10*3/uL (ref 4.0–10.5)
nRBC: 0 % (ref 0.0–0.2)

## 2020-03-08 LAB — BASIC METABOLIC PANEL
Anion gap: 11 (ref 5–15)
BUN: 6 mg/dL (ref 6–20)
CO2: 23 mmol/L (ref 22–32)
Calcium: 8.8 mg/dL — ABNORMAL LOW (ref 8.9–10.3)
Chloride: 102 mmol/L (ref 98–111)
Creatinine, Ser: 0.58 mg/dL — ABNORMAL LOW (ref 0.61–1.24)
GFR, Estimated: >60 mL/min (ref >60)
Glucose, Bld: 185 mg/dL — ABNORMAL HIGH (ref 70–99)
Potassium: 3.7 mmol/L (ref 3.5–5.1)
Sodium: 136 mmol/L (ref 135–145)

## 2020-03-08 LAB — GLUCOSE, CAPILLARY
Glucose-Capillary: 151 mg/dL — ABNORMAL HIGH (ref 70–99)
Glucose-Capillary: 194 mg/dL — ABNORMAL HIGH (ref 70–99)
Glucose-Capillary: 255 mg/dL — ABNORMAL HIGH (ref 70–99)

## 2020-03-08 MED ORDER — NYSTATIN 100000 UNIT/GM EX CREA: TOPICAL_CREAM | Freq: Two times a day (BID) | CUTANEOUS | 0 refills | Status: DC

## 2020-03-08 MED ORDER — INSULIN ASPART PROT & ASPART (70-30 MIX) 100 UNIT/ML PEN: 30.0000 [IU] | PEN_INJECTOR | Freq: Two times a day (BID) | SUBCUTANEOUS | 0 refills | Status: DC

## 2020-03-08 MED ORDER — METRONIDAZOLE 500 MG PO TABS: 500.0000 mg | ORAL_TABLET | Freq: Three times a day (TID) | ORAL | 0 refills | Status: DC

## 2020-03-08 MED ORDER — SERTRALINE HCL 25 MG PO TABS
25.0000 mg | ORAL_TABLET | Freq: Every day | ORAL | 0 refills | Status: DC
Start: 2020-03-09 — End: 2020-04-09

## 2020-03-08 MED ORDER — BLOOD GLUCOSE METER KIT: PACK | 0 refills | Status: DC

## 2020-03-08 MED ORDER — ORITAVANCIN DIPHOSPHATE 400 MG IV SOLR
1200.0000 mg | Freq: Once | INTRAVENOUS | Status: AC
  Administered 2020-03-08: 1200 mg via INTRAVENOUS
  Filled 2020-03-08: qty 120

## 2020-03-08 MED ORDER — METRONIDAZOLE 500 MG PO TABS
500.0000 mg | ORAL_TABLET | Freq: Three times a day (TID) | ORAL | Status: DC
  Administered 2020-03-08: 500 mg via ORAL
  Filled 2020-03-08: qty 1

## 2020-03-08 MED FILL — SERTRALINE HCL 25 MG TABS: 25 | 30 days supply | Qty: 30 | Fill #0

## 2020-03-08 MED FILL — TRUE METRIX GLUCOSE TEST ST: 25 days supply | Qty: 100 | Fill #0

## 2020-03-08 MED FILL — NYSTATIN 100,000 UNIT/GM CR: 100000 | 15 days supply | Qty: 30 | Fill #0

## 2020-03-08 MED FILL — NOVOLOG MIX 70-30 FLEXPEN S: (70-30) 100 | 25 days supply | Qty: 15 | Fill #0

## 2020-03-08 MED FILL — metroNIDAZOLE 500 MG TABS: 500 | 14 days supply | Qty: 42 | Fill #0

## 2020-03-08 MED FILL — TRUE METRIX BLOOD GLUCOSE M: W/DEVICE | 1 days supply | Qty: 1 | Fill #0

## 2020-03-08 MED FILL — TRUEplus LANCETS 28G MISC: 25 days supply | Qty: 100 | Fill #0

## 2020-03-08 MED FILL — PENTIPS 32G X 4 MM MISC: 32G X 4 MM | 30 days supply | Qty: 100 | Fill #0

## 2020-03-08 NOTE — Progress Notes (Signed)
Patient ID: [name], male   DOB: Jun 15, 1980, 39 y.o.   MRN: [id number] Patient's dressing is clean and dry, discussed the importance of weightbearing on the heel, elevation, we will change the dressing at follow-up in the office or sooner if it becomes soiled.

## 2020-03-08 NOTE — TOC Transition Note (Signed)
Transition of Care [facility]) - CM/SW Discharge Note   Patient Details  Name: [name] MRN: [id number] Date of Birth: 1981/02/26  [facility] CM/SW Contact:  Leone Haven, RN Phone Number: 03/08/2020, 2:36 PM   Clinical Narrative:    Patient for possible discharge today, he will need rolling walker and shower chair for charity thru [employer].  NCM made referral to Cassie/Shelia with [employer].  They will bring DME to room prior to dc. MD is also asking about cheaper and affordable insulin for patient, NCM informed her to call Diabetic coordinator but they ususally like 70/30 .    Final next level of care: Home/Self Care Barriers to Discharge: Continued Medical Work up   Patient Goals and CMS Choice Patient states their goals for this hospitalization and ongoing recovery are:: get better   Choice offered to / list presented to : NA  Discharge Placement                       Discharge Plan and Services                DME Arranged: Walker rolling, Shower stool DME Agency: [employer] Date DME Agency Contacted: 03/08/20 Time DME Agency Contacted: [phone] Representative spoke with at DME Agency: Cassie/Shelia HH Arranged: NA          Social Determinants of Health (SDOH) Interventions     Readmission Risk Interventions No flowsheet data found.

## 2020-03-08 NOTE — Discharge Instructions (Signed)
Dear [name],   Thank you so much for allowing [name] to be part of your care!  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   POST-HOSPITAL & CARE INSTRUCTIONS 1. Please continue to take you Diabetes medications. 2. Please follow up with your Primary Care doctor Nov. 2nd at [facility] '@2' :30pm. 3. Please follow-up with Infectious Disease doctors  03/21/2020 9am. 4. Please let PCP/Specialists know of any changes that were made.  5. Please see medications section of this packet for any medication changes.   DOCTOR'S APPOINTMENT & FOLLOW UP CARE INSTRUCTIONS  Future Appointments  Date Time Provider [place]  03/21/2020  9:00 AM [place] Call[place], NP [facility]  03/26/2020  2:30 PM Ladell Pier, MD [facility] None    RETURN PRECAUTIONS:   Take care and be well!  [facility]  [place], [place] [address] [phone] [facility]:  Phone: [phone]  Address: [place], [place] [address] h Hypoglycemia Hypoglycemia occurs when the level of sugar (glucose) in the blood is too low. Hypoglycemia can happen in people who do or do not have diabetes. It can develop quickly, and it can be a medical emergency. For most people with diabetes, a blood glucose level below 70 mg/dL (3.9 mmol/L) is considered hypoglycemia. Glucose is a type of sugar that provides the body's main source of energy. Certain hormones (insulin and glucagon) control the level of glucose in the blood. Insulin lowers blood glucose, and glucagon raises blood glucose. Hypoglycemia can result from having too much insulin in the bloodstream, or from not eating enough food that contains glucose. You may also have reactive hypoglycemia, which happens within 4 hours after eating a meal. What are  the causes? Hypoglycemia occurs most often in people who have diabetes and may be caused by:  Diabetes medicine.  Not eating enough, or not eating often enough.  Increased physical activity.  Drinking alcohol on an empty stomach. If you do not have diabetes, hypoglycemia may be caused by:  A tumor in the pancreas.  Not eating enough, or not eating for long periods at a time (fasting).  A severe infection or illness.  Certain medicines. What increases the risk? Hypoglycemia is more likely to develop in:  People who have diabetes and take medicines to lower blood glucose.  People who abuse alcohol.  People who have a severe illness. What are the signs or symptoms? Mild symptoms Blood Glucose Monitoring, Adult Monitoring your blood sugar (glucose) is an important part of managing your diabetes (diabetes mellitus). Blood glucose monitoring involves checking your blood glucose as often as directed and keeping a record (log) of your results over time. Checking your blood glucose regularly and keeping a blood glucose log can:  Help you and your health care provider adjust your diabetes management plan as needed, including your medicines or insulin.  Help you understand how food, exercise, illnesses, and medicines affect your blood glucose.  Let you know what your blood glucose is at any time. You can quickly find out if you have low blood glucose (hypoglycemia) or high blood glucose (hyperglycemia). Your health care provider will set individualized treatment goals for you. Your goals will be based on your age, other medical conditions you have, and how you respond to diabetes treatment. Generally, the goal of treatment  is to maintain the following blood glucose levels:  Before meals (preprandial): 80-130 mg/dL (4.4-7.2 mmol/L).  After meals (postprandial): below 180 mg/dL (10 mmol/L).  A1c level: less than 7%. Supplies needed:  Blood glucose meter.  Test strips for your  meter. Each meter has its own strips. You must use the strips that came with your meter.  A needle to prick your finger (lancet). Do not use a lancet more than one time.  A device that holds the lancet (lancing device).  A journal or log book to write down your results. How to check your blood glucose  1. Wash your hands with soap and water. 2. Prick the side of your finger (not the tip) with the lancet. Use a different finger each time. 3. Gently rub the finger until a small drop of blood appears. 4. Follow instructions that come with your meter for inserting the test strip, applying blood to the strip, and using your blood glucose meter. 5. Write down your result and any notes. Some meters allow you to use areas of your body other than your finger (alternative sites) to test your blood. The most common alternative sites are:  Forearm.  Thigh.  Palm of the hand. If you think you may have hypoglycemia, or if you have a history of not knowing when your blood glucose is getting low (hypoglycemia unawareness), do not use alternative sites. Use your finger instead. Alternative sites may not be as accurate as the fingers, because blood flow is slower in these areas. This means that the result you get may be delayed, and it may be different from the result that you would get from your finger. Follow these instructions at home: Blood glucose log   Every time you check your blood glucose, write down your result. Also write down any notes about things that may be affecting your blood glucose, such as your diet and exercise for the day. This information can help you and your health care provider: ? Look for patterns in your blood glucose over time. ? Adjust your diabetes management plan as needed.  Check if your meter allows you to download your records to a computer. Most glucose meters store a record of glucose readings in the meter. If you have type 1 diabetes:  Check your blood glucose 2  or more times a day.  Also check your blood glucose: ? Before every insulin injection. ? Before and after exercise. ? Before meals. ? 2 hours after a meal. ? Occasionally between 2:00 a.m. and 3:00 a.m., as directed. ? Before potentially dangerous tasks, like driving or using heavy machinery. ? At bedtime.  You may need to check your blood glucose more often, up to 6-10 times a day, if you: ? Use an insulin pump. ? Need multiple daily injections (MDI). ? Have diabetes that is not well-controlled. ? Are ill. ? Have a history of severe hypoglycemia. ? Have hypoglycemia unawareness. If you have type 2 diabetes:  If you take insulin or other diabetes medicines, check your blood glucose 2 or more times a day.  If you are on intensive insulin therapy, check your blood glucose 4 or more times a day. Occasionally, you may also need to check between 2:00 a.m. and 3:00 a.m., as directed.  Also check your blood glucose: ? Before and after exercise. ? Before potentially dangerous tasks, like driving or using heavy machinery.  You may need to check your blood glucose more often if: ? Your medicine is being  adjusted. ? Your diabetes is not well-controlled. ? You are ill. General tips  Always keep your supplies with you.  If you have questions or need help, all blood glucose meters have a 24-hour "hotline" phone number that you can call. You may also contact your health care provider.  After you use a few boxes of test strips, adjust (calibrate) your blood glucose meter by following instructions that came with your meter. Contact a health care provider if:  Your blood glucose is at or above 240 mg/dL (13.3 mmol/L) for 2 days in a row.  You have been sick or have had a fever for 2 days or longer, and you are not getting better.  You have any of the following problems for more than 6 hours: ? You cannot eat or drink. ? You have nausea or vomiting. ? You have diarrhea. Get help right  away if:  Your blood glucose is lower than 54 mg/dL (3 mmol/L).  You become confused or you have trouble thinking clearly.  You have difficulty breathing.  You have moderate or large ketone levels in your urine. Summary  Monitoring your blood sugar (glucose) is an important part of managing your diabetes (diabetes mellitus).  Blood glucose monitoring involves checking your blood glucose as often as directed and keeping a record (log) of your results over time.  Your health care provider will set individualized treatment goals for you. Your goals will be based on your age, other medical conditions you have, and how you respond to diabetes treatment.  Every time you check your blood glucose, write down your result. Also write down any notes about things that may be affecting your blood glucose, such as your diet and exercise for the day. This information is not intended to replace advice given to you by your health care provider. Make sure you discuss any questions you have with your health care provider. Document Revised: 03/04/2018 Document Reviewed: 10/21/2015 Elsevier Patient Education  [place].  Mild hypoglycemia may not cause any symptoms. If you do have symptoms, they may include:  Hunger.  Anxiety.  Sweating and feeling clammy.  Dizziness or feeling light-headed.  Sleepiness.  Nausea.  Increased heart rate.  Headache.  Blurry vision.  Irritability.  Tingling or numbness around the mouth, lips, or tongue.  A change in coordination.  Restless sleep. Moderate symptoms Moderate hypoglycemia can cause:  Mental confusion and poor judgment.  Behavior changes.  Weakness.  Irregular heartbeat. Severe symptoms Severe hypoglycemia is a medical emergency. It can cause:  Fainting.  Seizures.  Loss of consciousness (coma).  Death. How is this diagnosed? Hypoglycemia is diagnosed with a blood test to measure your blood glucose level. This blood  test is done while you are having symptoms. Your health care provider may also do a physical exam and review your medical history. How is this treated? This condition can often be treated by immediately eating or drinking something that contains sugar, such as:  Fruit juice, 4-6 oz (120-150 mL).  Regular soda (not diet soda), 4-6 oz (120-150 mL).  Low-fat milk, 4 oz (120 mL).  Several pieces of hard candy.  Sugar or honey, 1 Tbsp (15 mL). Treating hypoglycemia if you have diabetes If you are alert and able to swallow safely, follow the 15:15 rule:  Take 15 grams of a rapid-acting carbohydrate. Talk with your health care provider about how much you should take.  Rapid-acting options include: ? Glucose pills (take 15 grams). ? 6-8 pieces of  hard candy. ? 4-6 oz (120-150 mL) of fruit juice. ? 4-6 oz (120-150 mL) of regular (not diet) soda. ? 1 Tbsp (15 mL) honey or sugar.  Check your blood glucose 15 minutes after you take the carbohydrate.  If the repeat blood glucose level is still at or below 70 mg/dL (3.9 mmol/L), take 15 grams of a carbohydrate again.  If your blood glucose level does not increase above 70 mg/dL (3.9 mmol/L) after 3 tries, seek emergency medical care.  After your blood glucose level returns to normal, eat a meal or a snack within 1 hour.  Treating severe hypoglycemia Severe hypoglycemia is when your blood glucose level is at or below 54 mg/dL (3 mmol/L). Severe hypoglycemia is a medical emergency. Get medical help right away. If you have severe hypoglycemia and you cannot eat or drink, you may need an injection of glucagon. A family member or close friend should learn how to check your blood glucose and how to give you a glucagon injection. Ask your health care provider if you need to have an emergency glucagon injection kit available. Severe hypoglycemia may need to be treated in a hospital. The treatment may include getting glucose through an IV. You may also  need treatment for the cause of your hypoglycemia. Follow these instructions at home:  General instructions  Take over-the-counter and prescription medicines only as told by your health care provider.  Monitor your blood glucose as told by your health care provider.  Limit alcohol intake to no more than 1 drink a day for nonpregnant women and 2 drinks a day for men. One drink equals 12 oz of beer (355 mL), 5 oz of wine (148 mL), or 1 oz of hard liquor (44 mL).  Keep all follow-up visits as told by your health care provider. This is important. If you have diabetes:  Always have a rapid-acting carbohydrate snack with you to treat low blood glucose.  Follow your diabetes management plan as directed. Make sure you: ? Know the symptoms of hypoglycemia. It is important to treat it right away to prevent it from becoming severe. ? Take your medicines as directed. ? Follow your exercise plan. ? Follow your meal plan. Eat on time, and do not skip meals. ? Check your blood glucose as often as directed. Always check before and after exercise. ? Follow your sick day plan whenever you cannot eat or drink normally. Make this plan in advance with your health care provider.  Share your diabetes management plan with people in your workplace, school, and household.  Check your urine for ketones when you are ill and as told by your health care provider.  Carry a medical alert card or wear medical alert jewelry. Contact a health care provider if:  You have problems keeping your blood glucose in your target range.  You have frequent episodes of hypoglycemia. Get help right away if:  You continue to have hypoglycemia symptoms after eating or drinking something containing glucose.  Your blood glucose is at or below 54 mg/dL (3 mmol/L).  You have a seizure.  You faint. These symptoms may represent a serious problem that is an emergency. Do not wait to see if the symptoms will go away. Get medical  help right away. Call your local emergency services (911 in the [place] Summary  Hypoglycemia occurs when the level of sugar (glucose) in the blood is too low.  Hypoglycemia can happen in people who do or do not have diabetes. It can  develop quickly, and it can be a medical emergency.  Make sure you know the symptoms of hypoglycemia and how to treat it.  Always have a rapid-acting carbohydrate snack with you to treat low blood sugar. This information is not intended to replace advice given to you by your health care provider. Make sure you discuss any questions you have with your health care provider. Document Revised: 11/01/2017 Document Reviewed: 06/14/2015 Elsevier Patient Education  [place].  Hyperglycemia Hyperglycemia occurs when the level of sugar (glucose) in the blood is too high. Glucose is a type of sugar that provides the body's main source of energy. Certain hormones (insulin and glucagon) control the level of glucose in the blood. Insulin lowers blood glucose, and glucagon increases blood glucose. Hyperglycemia can result from having too little insulin in the bloodstream, or from the body not responding normally to insulin. Hyperglycemia occurs most often in people who have diabetes (diabetes mellitus), but it can happen in people who do not have diabetes. It can develop quickly, and it can be life-threatening if it causes you to become severely dehydrated (diabetic ketoacidosis or hyperglycemic hyperosmolar state). Severe hyperglycemia is a medical emergency. What are the causes? If you have diabetes, hyperglycemia may be caused by:  Diabetes medicine.  Medicines that increase blood glucose or affect your diabetes control.  Not eating enough, or not eating often enough.  Changes in physical activity level.  Being sick or having an infection. If you have prediabetes or undiagnosed diabetes:  Hyperglycemia may be caused by those conditions. If you do not have  diabetes, hyperglycemia may be caused by:  Certain medicines, including steroid medicines, beta-blockers, epinephrine, and thiazide diuretics.  Stress.  Serious illness.  Surgery.  Diseases of the pancreas.  Infection. What increases the risk? Hyperglycemia is more likely to develop in people who have risk factors for diabetes, such as:  Having a family member with diabetes.  Having a gene for type 1 diabetes that is passed from parent to child (inherited).  Living in an area with cold weather conditions.  Exposure to certain viruses.  Certain conditions in which the body's disease-fighting (immune) system attacks itself (autoimmune disorders).  Being overweight or obese.  Having an inactive (sedentary) lifestyle.  Having been diagnosed with insulin resistance.  Having a history of prediabetes, gestational diabetes, or polycystic ovarian syndrome (PCOS).  Being of American-Indian, African-American, Hispanic/Latino, or Asian/Pacific Islander descent. What are the signs or symptoms? Hyperglycemia may not cause any symptoms. If you do have symptoms, they may include early warning signs, such as:  Increased thirst.  Hunger.  Feeling very tired.  Needing to urinate more often than usual.  Blurry vision. Other symptoms may develop if hyperglycemia gets worse, such as:  Dry mouth.  Loss of appetite.  Fruity-smelling breath.  Weakness.  Unexpected or rapid weight gain or weight loss.  Tingling or numbness in the hands or feet.  Headache.  Skin that does not quickly return to normal after being lightly pinched and released (poor skin turgor).  Abdominal pain.  Cuts or bruises that are slow to heal. How is this diagnosed? Hyperglycemia is diagnosed with a blood test to measure your blood glucose level. This blood test is usually done while you are having symptoms. Your health care provider may also do a physical exam and review your medical history. You may  have more tests to determine the cause of your hyperglycemia, such as:  A fasting blood glucose (FBG) test. You will not  be allowed to eat (you will fast) for at least 8 hours before a blood sample is taken.  An A1c (hemoglobin A1c) blood test. This provides information about blood glucose control over the previous 2-3 months.  An oral glucose tolerance test (OGTT). This measures your blood glucose at two times: ? After fasting. This is your baseline blood glucose level. ? Two hours after drinking a beverage that contains glucose. How is this treated? Treatment depends on the cause of your hyperglycemia. Treatment may include:  Taking medicine to regulate your blood glucose levels. If you take insulin or other diabetes medicines, your medicine or dosage may be adjusted.  Lifestyle changes, such as exercising more, eating healthier foods, or losing weight.  Treating an illness or infection, if this caused your hyperglycemia.  Checking your blood glucose more often.  Stopping or reducing steroid medicines, if these caused your hyperglycemia. If your hyperglycemia becomes severe and it results in hyperglycemic hyperosmolar state, you must be hospitalized and given IV fluids. Follow these instructions at home:  General instructions  Take over-the-counter and prescription medicines only as told by your health care provider.  Do not use any products that contain nicotine or tobacco, such as cigarettes and e-cigarettes. If you need help quitting, ask your health care provider.  Limit alcohol intake to no more than 1 drink per day for nonpregnant women and 2 drinks per day for men. One drink equals 12 oz of beer, 5 oz of wine, or 1 oz of hard liquor.  Learn to manage stress. If you need help with this, ask your health care provider.  Keep all follow-up visits as told by your health care provider. This is important. Eating and drinking   Maintain a healthy weight.  Exercise regularly,  as directed by your health care provider.  Stay hydrated, especially when you exercise, get sick, or spend time in hot temperatures.  Eat healthy foods, such as: ? Lean proteins. ? Complex carbohydrates. ? Fresh fruits and vegetables. ? Low-fat dairy products. ? Healthy fats.  Drink enough fluid to keep your urine clear or pale yellow. If you have diabetes:  Make sure you know the symptoms of hyperglycemia.  Follow your diabetes management plan, as told by your health care provider. Make sure you: ? Take your insulin and medicines as directed. ? Follow your exercise plan. ? Follow your meal plan. Eat on time, and do not skip meals. ? Check your blood glucose as often as directed. Make sure to check your blood glucose before and after exercise. If you exercise longer or in a different way than usual, check your blood glucose more often. ? Follow your sick day plan whenever you cannot eat or drink normally. Make this plan in advance with your health care provider.  Share your diabetes management plan with people in your workplace, school, and household.  Check your urine for ketones when you are ill and as told by your health care provider.  Carry a medical alert card or wear medical alert jewelry. Contact a health care provider if:  Your blood glucose is at or above 240 mg/dL (13.3 mmol/L) for 2 days in a row.  You have problems keeping your blood glucose in your target range.  You have frequent episodes of hyperglycemia. Get help right away if:  You have difficulty breathing.  You have a change in how you think, feel, or act (mental status).  You have nausea or vomiting that does not go away. These symptoms  may represent a serious problem that is an emergency. Do not wait to see if the symptoms will go away. Get medical help right away. Call your local emergency services (911 in the [place] Do not drive yourself to the hospital. Summary  Hyperglycemia occurs when the level  of sugar (glucose) in the blood is too high.  Hyperglycemia is diagnosed with a blood test to measure your blood glucose level. This blood test is usually done while you are having symptoms. Your health care provider may also do a physical exam and review your medical history.  If you have diabetes, follow your diabetes management plan as told by your health care provider.  Contact your health care provider if you have problems keeping your blood glucose in your target range. This information is not intended to replace advice given to you by your health care provider. Make sure you discuss any questions you have with your health care provider. Document Revised: 01/27/2016 Document Reviewed: 01/27/2016 Elsevier Patient Education  [place].   Hemoglobin A1c Test Why am I having this test? You may have the hemoglobin A1c test (HbA1c test) done to:  Evaluate your risk for developing diabetes (diabetes mellitus).  Diagnose diabetes.  Monitor long-term control of blood sugar (glucose) in people who have diabetes and help make treatment decisions. This test may be done with other blood glucose tests, such as fasting blood glucose and oral glucose tolerance tests. What is being tested? Hemoglobin is a type of protein in the blood that carries oxygen. Glucose attaches to hemoglobin to form glycated hemoglobin. This test checks the amount of glycated hemoglobin in your blood, which is a good indicator of the average amount of glucose in your blood during the past 2-3 months. What kind of sample is taken?  A blood sample is required for this test. It is usually collected by inserting a needle into a blood vessel. Tell a health care provider about:  All medicines you are taking, including vitamins, herbs, eye drops, creams, and over-the-counter medicines.  Any blood disorders you have.  Any surgeries you have had.  Any medical conditions you have.  Whether you are pregnant or may  be pregnant. How are the results reported? Your results will be reported as a percentage that indicates how much of your hemoglobin has glucose attached to it (is glycated). Your health care provider will compare your results to normal ranges that were established after testing a large group of people (reference ranges). Reference ranges may vary among labs and hospitals. For this test, common reference ranges are:  Adult or child without diabetes: 4-5.6%.  Adult or child with diabetes and good blood glucose control: less than 7%. What do the results mean? If you have diabetes:  A result of less than 7% is considered normal, meaning that your blood glucose is well controlled.  A result higher than 7% means that your blood glucose is not well controlled, and your treatment plan may need to be adjusted. If you do not have diabetes:  A result within the reference range is considered normal, meaning that you are not at high risk for diabetes.  A result of 5.7-6.4% means that you have a high risk of developing diabetes, and you may have prediabetes. Prediabetes is the condition of having a blood glucose level that is higher than it should be, but not high enough for you to be diagnosed with diabetes. Having prediabetes puts you at risk for developing type 2 diabetes (type  2 diabetes mellitus). You may have more tests, including a repeat HbA1c test.  Results of 6.5% or higher on two separate HbA1c tests mean that you have diabetes. You may have more tests to confirm the diagnosis. Abnormally low HbA1c values may be caused by:  Pregnancy.  Severe blood loss.  Receiving donated blood (transfusions).  Low red blood cell count (anemia).  Long-term kidney failure.  Some unusual forms (variants) of hemoglobin. Talk with your health care provider about what your results mean. Questions to ask your health care provider Ask your health care provider, or the department that is doing the  test:  When will my results be ready?  How will I get my results?  What are my treatment options?  What other tests do I need?  What are my next steps? Summary  The hemoglobin A1c test (HbA1c test) may be done to evaluate your risk for developing diabetes, to diagnose diabetes, and to monitor long-term control of blood sugar (glucose) in people who have diabetes and help make treatment decisions.  Hemoglobin is a type of protein in the blood that carries oxygen. Glucose attaches to hemoglobin to form glycated hemoglobin. This test checks the amount of glycated hemoglobin in your blood, which is a good indicator of the average amount of glucose in your blood during the past 2-3 months.  Talk with your health care provider about what your results mean. This information is not intended to replace advice given to you by your health care provider. Make sure you discuss any questions you have with your health care provider. Document Revised: 04/23/2017 Document Reviewed: 12/22/2016 Elsevier Patient Education  [place].

## 2020-03-08 NOTE — Progress Notes (Signed)
Subjective: Patient reports that he is doing well this AM. No acute overnight events. He is aware that there have been some changes in his abx as he prepares for discharge.  Objective:  Vital signs in last 24 hours: Vitals:   03/07/20 2109 03/08/20 0401 03/08/20 0726 03/08/20 1049  BP: 140/82  126/71 135/85  Pulse: 92  90 90  Resp: 11  11 12   Temp: 97.8 F (36.6 C)  98.1 F (36.7 C) 98.2 F (36.8 C)  TempSrc: Oral  Oral Oral  SpO2: 98%  97% 98%  Weight:  (!) 143.7 kg    Height:       Physical Exam Constitutional:      Appearance: Normal appearance. He is obese.  HENT:     Head: Normocephalic and atraumatic.  Cardiovascular:     Rate and Rhythm: Normal rate and regular rhythm.  Pulmonary:     Effort: Pulmonary effort is normal.     Breath sounds: Normal breath sounds.  Abdominal:     General: Bowel sounds are normal.     Palpations: Abdomen is soft.     Tenderness: There is no abdominal tenderness.  Skin:    General: Skin is warm and dry.     Comments: L foot is wrapped in post-op bandages. No erythema noted in the LLE or RLE. No pain to palpation of either LE.   No edema noted in extremities.  Neurological:     General: No focal deficit present.     Mental Status: He is alert.  Psychiatric:        Mood and Affect: Mood normal.        Behavior: Behavior normal.     Assessment/Plan:  Active Problems:   Cellulitis of left toe   Diabetic ketoacidosis (HCC)   DKA (diabetic ketoacidosis) (HCC)   Sepsis with acute renal failure without septic shock (HCC)   Abscess of great toe, left   Major depressive disorder, recurrent episode, moderate (HCC)   Osteomyelitis of left foot (HCC)   Sepsis due to group B Streptococcus with acute renal failure (HCC)   Anaerobic bacteremia  [name] is a 39 y.o. gentleman with PMHx uncontrolled type II DM,recurrent bilateral lower extremity diabetic foot wounds,bilateral lower extremity venous insufficiency,  presentedwith subjective fevers and intermittent chills that have progressively worsened over the past 3 days.  # Sepsis in setting ofLLE Cellulitis due to Infected Diabetic Foot Wound Patient continues to improve. Patient pain is stable. Patient was unable to receive PICC line after discovering that he does not qualify for charity care and is unable to afford the home health nurse. ID was encouraged that the patient has been doing well on 1 week of IV abx already. New plans is that patient is starting oritavancin this AM and ID will f/u in 2 weeks and consider Augmentin. Patient has also been placed on flagyl for GRN anaerobic coverage. - PO Metronidazole 500mg  TID PO - Oritavancin this AM -DailyCBC, CMP - Speak with Ortho about post-op wound care  #Hyperglycemia w/ uncontrolled T2DM presented with Mild DKA CBGs170< <196<<276 -Lantus45 U SAI 0-15 TID w/ SAI 0-5 U qh w/ moderate Sliding scale -Carb modified diet - Tylenol 650 q6hrs PRN for pain   # Penile rash No complaints this AM. - Nystatin BID   # Mood disorder SW will provide patient info to schedule or walk in to [facility] -ContinueZoloft 25mg    # Acute Kidney Injury-resolved -Daily weights, Daily Renal  Panel, Strict I/Os, Avoid nephrotoxins (NSAIDs, judicious IV Contrast)  Medication Issues; ? Preferred narcotic agents for pain control are hydromorphone, fentanyl, and methadone. Morphine should not be used.  ? Baclofen should be avoided ? Avoid oral sodium phosphate and magnesium citrate based laxatives / bowel preps   # Hypertension Bp this am 126/78 -Continue to holdlisinopril 20mg  -Continue to monitor   # Hyperlipidemia Previously on Lipitor 20mg  daily. Please resume when appropriate as patient is diabetic and 39 years old - Follow up outpatient  modified VTE ppx:SQ Heparin CODE STATUS:FULL  Prior to Admission Living  Arrangement:Home Anticipated Discharge Location:Home Barriers to Discharge:Treatment Dispo: Anticipated discharge in approximately 1day(s). 39, MD 03/08/2020, 11:31 AM Pager:[phone] After 5pm on weekdays and 1pm on weekends: On Call pager [phone]

## 2020-03-08 NOTE — Plan of Care (Signed)

## 2020-03-08 NOTE — TOC Progression Note (Signed)
Transition of Care [facility]) - Progression Note    Patient Details  Name: [name] MRN: [id number] Date of Birth: Mar 22, 1981  Transition of Care [facility]) CM/SW Contact  Leone Haven, RN Phone Number: 03/08/2020, 2:18 PM  Clinical Narrative:    NCM informed by physical therapist that patient will need rolling walker at dc.  NCM made referral to [place] with [employer], this will be brought to room prior to dc.         Expected Discharge Plan and Services                                                 Social Determinants of Health (SDOH) Interventions    Readmission Risk Interventions No flowsheet data found.

## 2020-03-08 NOTE — Consult Note (Signed)
WOC Nurse Consult Note: Patient receiving care in [facility] 2C17. Reason for Consult: "post op wound care - instructions for care until follow up with Dr. Lajoyce Corners". I communicated to Dr. Laural Benes via telephone, and Dr. Albertha Ghee via Secure Chat the following: "The WOC nurses do not instruct Dr. Audrie Lia patient on post surgical care. The surgeon is responsible for that. Please consult Dr. Lajoyce Corners. Cordelia Pen WOC nurse" The WOC nurse did not see and will not follow. Helmut Muster, RN, MSN, CWOCN, CNS-BC, pager [phone]

## 2020-03-08 NOTE — Progress Notes (Signed)
Physical Therapy Treatment Patient Details Name: [name] MRN: [id number] DOB: 1981/02/10 Today's Date: 03/08/2020    History of Present Illness [name] is a 39 y.o. male who presents with uncontrolled type 2 diabetes patient states that the left great toe has started swelling more now with purulent drainage after starting his oral doxycycline.  Underwent a LEFT GREAT TOE AMPUTATION (Left).    PT Comments    Pt making good progress and ready for dc from PT standpoint. Needs rolling walker for home.    Follow Up Recommendations  No PT follow up     Equipment Recommendations  Rolling walker with 5" wheels    Recommendations for Other Services       Precautions / Restrictions Precautions Precautions: Fall Restrictions Weight Bearing Restrictions: Yes LLE Weight Bearing: Partial weight bearing Other Position/Activity Restrictions: Allowed to WB through L heal for balance per MD note and MD in room    Mobility  Bed Mobility Overal bed mobility: Independent                Transfers Overall transfer level: Needs assistance Equipment used: Rolling walker (2 wheeled) Transfers: Sit to/from Stand Sit to Stand: Supervision         General transfer comment: Needed 2 attempts to rise from surface but no physical assistance  Ambulation/Gait Ambulation/Gait assistance: Supervision Gait Distance (Feet): 160 Feet Assistive device: Rolling walker (2 wheeled) Gait Pattern/deviations: Step-to pattern;Decreased stance time - left;Decreased step length - right;Decreased step length - left Gait velocity: decr Gait velocity interpretation: <1.31 ft/sec, indicative of household ambulator General Gait Details: supervision for lines. Good use of walker   Stairs Stairs: Yes Stairs assistance: Min guard Stair Management: Step to pattern;Forwards;With walker Number of Stairs: 1 General stair comments: Used portable step with walker to simulate rails at  home   Wheelchair Mobility    Modified Rankin (Stroke Patients Only)       Balance Overall balance assessment: No apparent balance deficits (not formally assessed)                                          Cognition Arousal/Alertness: Awake/alert Behavior During Therapy: WFL for tasks assessed/performed Overall Cognitive Status: Within Functional Limits for tasks assessed                                        Exercises      General Comments        Pertinent Vitals/Pain Pain Assessment: No/denies pain    Home Living                      Prior Function            PT Goals (current goals can now be found in the care plan section) Acute Rehab PT Goals Patient Stated Goal: I'd like to get home Progress towards PT goals: Progressing toward goals    Frequency    Min 3X/week      PT Plan Current plan remains appropriate    Co-evaluation              AM-PAC PT "6 Clicks" Mobility   Outcome Measure  Help needed turning from your back to your side while in a flat bed without using bedrails?:  None Help needed moving from lying on your back to sitting on the side of a flat bed without using bedrails?: None Help needed moving to and from a bed to a chair (including a wheelchair)?: A Little Help needed standing up from a chair using your arms (e.g., wheelchair or bedside chair)?: A Little Help needed to walk in hospital room?: A Little Help needed climbing 3-5 steps with a railing? : A Little 6 Click Score: 20    End of Session   Activity Tolerance: Patient tolerated treatment well Patient left: in bed;with call bell/phone within reach;with family/visitor present   PT Visit Diagnosis: Other abnormalities of gait and mobility (R26.89)     Time: 1275-1700 PT Time Calculation (min) (ACUTE ONLY): 18 min  Charges:  $Gait Training: 8-22 mins                     [facility] PT Acute Rehabilitation  Services Pager [phone] Office [phone]    [name] [facility] 03/08/2020, 1:19 PM

## 2020-03-09 LAB — CULTURE, BLOOD (ROUTINE X 2)
Culture: NO GROWTH
Culture: NO GROWTH
Special Requests: ADEQUATE
Special Requests: ADEQUATE

## 2020-03-10 NOTE — Discharge Summary (Signed)
Name: [name] MRN: [id number] DOB: Feb 26, 1981 39 y.o. PCP: [name] Pollen, MD  Date of Admission: 03/02/2020  6:38 PM Date of Discharge: 03/08/2020 Attending Physician: Dr. Dareen [name]  Discharge Diagnosis: Active Problems:   Cellulitis of left toe   Diabetic ketoacidosis ([place])   DKA (diabetic ketoacidosis) ([place])   Sepsis with acute renal failure without septic shock ([place])   Abscess of great toe, left   Major depressive disorder, recurrent episode, moderate ([place])   Osteomyelitis of left foot ([place])   Sepsis due to group B Streptococcus with acute renal failure ([place])   Anaerobic bacteremia   Discharge Medications: Allergies as of 03/08/2020      Reactions   Milk-related Compounds Diarrhea   Iodine    Patient endorses unspecified reaction to iodine in past      Medication List    STOP taking these medications   acetaminophen 500 MG tablet Commonly known as: TYLENOL   atorvastatin 20 MG tablet Commonly known as: LIPITOR   doxycycline 100 MG tablet Commonly known as: VIBRA-TABS   glipiZIDE 5 MG tablet Commonly known as: GLUCOTROL   lisinopril 20 MG tablet Commonly known as: ZESTRIL   metFORMIN 1000 MG tablet Commonly known as: GLUCOPHAGE   nitroGLYCERIN 0.2 mg/hr patch Commonly known as: NITRODUR - Dosed in mg/24 hr   OVER THE COUNTER MEDICATION     TAKE these medications   blood glucose meter kit and supplies Dispense based on patient and insurance preference. Use up to four times daily as directed. (FOR ICD-10 E10.9, E11.9).   insulin aspart protamine - aspart (70-30) 100 UNIT/ML FlexPen Commonly known as: NOVOLOG 70/30 MIX Inject 0.3 mLs (30 Units total) into the skin 2 (two) times daily.   metroNIDAZOLE 500 MG tablet Commonly known as: Flagyl Take 1 tablet (500 mg total) by mouth 3 (three) times daily for 14 days.   nystatin cream Commonly known as: MYCOSTATIN Apply topically 2 (two) times daily.   sertraline 25 MG  tablet Commonly known as: ZOLOFT Take 1 tablet (25 mg total) by mouth daily.            Discharge Care Instructions  (From admission, onward)         Start     Ordered   03/08/20 0000  Discharge wound care:       Comments: Dressing change at Ortho outpatient. Keep dry   03/08/20 1443          Disposition and follow-up:   TerranceTerrance [name] was discharged from [name] in Stable condition.  At the hospital follow up visit please address:  - Please address patient wound healing and assess the R toe ulceration as well. - Consider assessing if patient was able to remain compliant with his flagyl, patient has had access problems in the past - PCP may want to readdress T2DM regimen and control. Consider reeducating patient on effects of uncontrolled DM. - Consider addressing patient's mood and assessing possible therapies for patient in conjunction with medication   2.  Labs / imaging needed at time of follow-up: None  3.  Pending labs/ test needing follow-up: None  Follow-up Appointments:  Follow-up Information    [place]. Schedule an appointment as soon as possible for a visit on 03/26/2020.   Why: 2:30 for hospital follow up  Contact information: [name] [id number] [place], [place], [name] In 1  week.   Specialty: Orthopedic Surgery Contact information: [place] [address] [phone]        [facility] Follow up on 03/21/2020.   Specialty: Infectious Diseases Why: Apt with [name] @ 9:00 am Contact information: [place], [name] [id number] [place]. Call.   Why: No referral needed, walk-ins are ok.  Contact information: [place], [place] [address] [facility] Follow up.   Why: rolling walker, shower chair Contact information: [address] [place] [address] [phone]               Hospital Course by problem list: [name] is a 39 y.o. 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  Sepsis in setting of LLE Cellulitis due to Infected Diabetic Foot Wound  Patient was noted to have ulcers of the big to of both the Right and Left foot. MRI of the Right and Left foot noted possibly early osteomyelitis. Patient was also noted to have rising cellulitis to the mid-shin on the left leg. 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  ID was consulted and recommended that despite 1 toe amputation the patient should have 6 week course of IV medications.Patient was started on Vanc and cefepinme per medicine team. Bcx resulted Strep B. Patient was switched to PCN G IV. After amputation patient began to have improvement in his WBC and overall clinical exam. Bcx began to grow GNR and ID recommended stopping PCN G and starting Unasyn to cover possible anaerobe. Patient was unable to have PICC placed for outpatient tx due to lack of insurance and was likely not going to be able to afford the medication. ID felt that patient has shown significant improvement already and recommended Oritavancin with flagyl and follow-up in 2 weeks for Augmentin course.     DKA with AGMA in the setting of Uncontrolled Type II DM Patient treated per DKA protocol with insulin gtt, fluids, Kcl. Patient eventually transitioned to PO insulin and Carb modified diet. Patient BGL maintained on 40-45 U Lantus and moderate sliding scale.   Penile Rash Patient was noted to have white appearing fungal rash on  his penis. He is uncircumcised and was started on Nystatin. Patient reported improvement.   Acute Kidney Injury  Patient received fluids as per DKA protocol BUN/Cr improved to baseline.  Hyperbilirubinemia Patient Bilirubinemia resolved with treatment of Mild DKA and osteomyelitis.   Mood Disorder Patient was recorded as Active SI on admission; however upon psychiatry consultation this was determined to be passive. Patient does not have Bipolar disorder despite reporting he does. Patient has never had manic episodes. Patient was restarted on Zoloft and recommended to follow-up outpatient. Mood remained stable inpatient.       Discharge Vitals:   BP 135/85 (BP Location: Right Wrist)   Pulse 86   Temp 98.2 F (36.8 C) (Oral)   Resp 10   Ht '6\' 1"'  (1.854 m)   Wt (!) 143.7 kg   SpO2 97%   BMI 41.80 kg/m   Pertinent Labs, Studies, and Procedures:  03/03/2020- MRI Foot R 03/03/2020 MRI Foot Left ESR 81  CRP 9.0 Bcx grew: Strep B and GNR Orhto Surgery: L toe great toe amputation  Discharge Instructions: Discharge Instructions    Diet - low sodium heart healthy   Complete by: As directed    Discharge wound care:   Complete by: As directed    Dressing change at Ortho outpatient. Keep dry   Increase activity slowly   Complete by: As directed       Signed: Freida Busman, MD 03/10/2020, 4:41 PM   Pager:[phone]

## 2020-03-14 ENCOUNTER — Encounter: Payer: Self-pay | Admitting: Orthopedic Surgery

## 2020-03-14 ENCOUNTER — Ambulatory Visit (INDEPENDENT_AMBULATORY_CARE_PROVIDER_SITE_OTHER): Payer: Self-pay | Admitting: Physician Assistant

## 2020-03-14 VITALS — Ht 73.0 in | Wt 316.0 lb

## 2020-03-14 DIAGNOSIS — M86072 Acute hematogenous osteomyelitis, left ankle and foot: Secondary | ICD-10-CM

## 2020-03-14 NOTE — Progress Notes (Signed)
Office Visit Note   Patient: [name]           Date of Birth: 1981-02-05           MRN: [id number] Visit Date: 03/14/2020              Requested by: [name], MD [address] [place],  [place] [address] PCP: [name], MD  Chief Complaint  Patient presents with  . Left Foot - Routine Post Op    03/06/20 left GT amputation       HPI: This is a pleasant gentleman who is 1 week status post left great toe amputation. He also has a ulcer and callus on his right great toe that we have been following. He has no complaints today.  Assessment & Plan: Visit Diagnoses: No diagnosis found.  Plan: Right great toe has a 1-1/2 cm slit under the toe. It does not probe deeply. Surrounding thickened callus. No cellulitis or foul odor. After obtaining verbal consent this was debrided. He will continue to do dressing changes on both sides. He can use Dial soap and water. I emphasized the importance of offloading his weightbearing on both of his feet. Follow-up in 1 week Follow-Up Instructions: No follow-ups on file.   Ortho Exam  Patient is alert, oriented, no adenopathy, well-dressed, normal affect, normal respiratory effort. Focused examination of the right foot is thickened callus with 1 cm slit in the center. Does not probe deeply have any foul odor. No cellulitis. On the left foot he does have some erythema but no ascending cellulitis. Is overall well approximated wound edges but some moderate swelling. Sutures are in place no necrosis   Imaging: No results found. No images are attached to the encounter.  Labs: Lab Results  Component Value Date   HGBA1C 11.8 (H) 03/03/2020   HGBA1C 11.4 (A) 05/02/2018   HGBA1C 11.3 (A) 01/25/2018   ESRSEDRATE 81 (H) 03/05/2020   CRP 9.0 (H) 03/05/2020   REPTSTATUS 03/09/2020 FINAL 03/04/2020   CULT  03/04/2020    NO GROWTH 5 DAYS Performed at [facility] Lab, [address] [place], [place] [address]     LABORGA GROUP B STREP(S.AGALACTIAE)ISOLATED 03/02/2020     Lab Results  Component Value Date   ALBUMIN 2.5 (L) 03/03/2020   ALBUMIN 3.1 (L) 03/02/2020   ALBUMIN 3.6 04/04/2019    No results found for: MG No results found for: VD25OH  No results found for: PREALBUMIN CBC EXTENDED Latest Ref Rng & Units 03/08/2020 03/07/2020 03/06/2020  WBC 4.0 - 10.5 K/uL 6.8 7.4 8.6  RBC 4.22 - 5.81 MIL/uL 4.71 4.74 4.65  HGB 13.0 - 17.0 g/dL 12.9(L) 12.7(L) 12.3(L)  HCT 39 - 52 % 39.7 39.6 38.5(L)  PLT 150 - 400 K/uL 349 322 280  NEUTROABS 1.7 - 7.7 K/uL 3.9 4.8 5.6  LYMPHSABS 0.7 - 4.0 K/uL 1.9 1.8 2.1     Body mass index is 41.69 kg/m.  Orders:  No orders of the defined types were placed in this encounter.  No orders of the defined types were placed in this encounter.    Procedures: No procedures performed  Clinical Data: No additional findings.  ROS:  All other systems negative, except as noted in the HPI. Review of Systems  Objective: Vital Signs: Ht 6\' 1"  (1.854 m)   Wt (!) 316 lb (143.3 kg)   BMI 41.69 kg/m   Specialty Comments:  No specialty comments available.  PMFS History: Patient  Active Problem List   Diagnosis Date Noted  . Anaerobic bacteremia 03/07/2020  . Major depressive disorder, recurrent episode, moderate (HCC) 03/04/2020  . Sepsis with acute renal failure without septic shock (HCC)   . Abscess of great toe, left   . Osteomyelitis of left foot (HCC)   . Sepsis due to group B Streptococcus with acute renal failure (HCC)   . Cellulitis of left toe 03/03/2020  . Diabetic ketoacidosis (HCC) 03/03/2020  . DKA (diabetic ketoacidosis) (HCC) 03/03/2020  . Right foot ulcer, limited to breakdown of skin (HCC) 06/06/2019  . Non-pressure chronic ulcer of other part of left foot limited to breakdown of skin (HCC) 06/06/2019  . Toe infection 04/04/2019  . Uncontrolled type 2 diabetes mellitus with hyperglycemia (HCC) 05/02/2018  . Diabetic ulcer of left  great toe (HCC) 07/30/2017  . Venous insufficiency of both lower extremities 07/30/2017  . Obesity, Class III, BMI 40-49.9 (morbid obesity) (HCC) 07/30/2017  . Essential hypertension 09/03/2016   Past Medical History:  Diagnosis Date  . Diabetes mellitus without complication (HCC)   . Hypertension     Family History  Problem Relation Age of Onset  . Diabetes Mother   . Hyperlipidemia Father   . Hypertension Father     Past Surgical History:  Procedure Laterality Date  . AMPUTATION Left 03/06/2020   Procedure: LEFT GREAT TOE AMPUTATION;  Surgeon: [name], MD;  Location: [facility] OR;  Service: Orthopedics;  Laterality: Left;   Social History   Occupational History  . Occupation: Unemployed  Tobacco Use  . Smoking status: Never Smoker  . Smokeless tobacco: Never Used  Vaping Use  . Vaping Use: Never used  Substance and Sexual Activity  . Alcohol use: No    Alcohol/week: 0.0 standard drinks  . Drug use: No  . Sexual activity: Not on file

## 2020-03-21 ENCOUNTER — Other Ambulatory Visit: Payer: Self-pay

## 2020-03-21 ENCOUNTER — Ambulatory Visit (INDEPENDENT_AMBULATORY_CARE_PROVIDER_SITE_OTHER): Payer: Self-pay | Admitting: Orthopedic Surgery

## 2020-03-21 ENCOUNTER — Ambulatory Visit (INDEPENDENT_AMBULATORY_CARE_PROVIDER_SITE_OTHER): Payer: Self-pay | Admitting: Infectious Diseases

## 2020-03-21 ENCOUNTER — Ambulatory Visit: Payer: Self-pay | Admitting: Orthopedic Surgery

## 2020-03-21 ENCOUNTER — Encounter: Payer: Self-pay | Admitting: Orthopedic Surgery

## 2020-03-21 ENCOUNTER — Encounter: Payer: Self-pay | Admitting: Infectious Diseases

## 2020-03-21 VITALS — BP 117/77 | HR 103

## 2020-03-21 VITALS — Ht 73.0 in | Wt 316.0 lb

## 2020-03-21 DIAGNOSIS — L02612 Cutaneous abscess of left foot: Secondary | ICD-10-CM

## 2020-03-21 DIAGNOSIS — A401 Sepsis due to streptococcus, group B: Secondary | ICD-10-CM

## 2020-03-21 DIAGNOSIS — R6520 Severe sepsis without septic shock: Secondary | ICD-10-CM

## 2020-03-21 DIAGNOSIS — N179 Acute kidney failure, unspecified: Secondary | ICD-10-CM

## 2020-03-21 DIAGNOSIS — M86072 Acute hematogenous osteomyelitis, left ankle and foot: Secondary | ICD-10-CM

## 2020-03-21 DIAGNOSIS — L97511 Non-pressure chronic ulcer of other part of right foot limited to breakdown of skin: Secondary | ICD-10-CM

## 2020-03-21 MED ORDER — AMOXICILLIN-POT CLAVULANATE 875-125 MG PO TABS: 1.0000 | ORAL_TABLET | Freq: Two times a day (BID) | ORAL | 0 refills | Status: AC

## 2020-03-21 NOTE — Assessment & Plan Note (Signed)
S/P amputation of the great toe. Appears to be healing well. Has FU with Ortho today.

## 2020-03-21 NOTE — Progress Notes (Signed)
Name: [name]  DOB: 1981-03-18 MRN: [id number] PCP: Horald Pollen, MD    Patient Active Problem List   Diagnosis Date Noted  . Anaerobic bacteremia 03/07/2020    Priority: High  . Abscess of great toe, left     Priority: High  . Osteomyelitis of left foot ([place])     Priority: High  . Sepsis due to group B Streptococcus with acute renal failure (HCC)     Priority: High  . Cellulitis of left toe 03/03/2020    Priority: High  . Major depressive disorder, recurrent episode, moderate ([place]) 03/04/2020  . Diabetic ketoacidosis ([place]) 03/03/2020  . DKA (diabetic ketoacidosis) ([place]) 03/03/2020  . Right foot ulcer, limited to breakdown of skin ([place]) 06/06/2019  . Non-pressure chronic ulcer of other part of left foot limited to breakdown of skin ([place]) 06/06/2019  . Uncontrolled type 2 diabetes mellitus with hyperglycemia ([place]) 05/02/2018  . Diabetic ulcer of left great toe ([place]) 07/30/2017  . Venous insufficiency of both lower extremities 07/30/2017  . Obesity, Class III, BMI 40-49.9 (morbid obesity) ([place]) 07/30/2017  . Essential hypertension 09/03/2016      Subjective:   Chief Complaint  Patient presents with  . Hospitalization Follow-up    No questions or concerns     HPI: [name] is a 39 y.o. male recently admitted for acute osteomyelitis of the left great toe with secondary bacteremia due to group b streptococcus.   Received Vanc + Cefepime --> PCN infusion. He underwent amputation of the left great toe with clean margins with Dr. Sharol Given. The right great toe also had chronic ulcerations that improved  quickly with IV antibiotics. The plan was to continue with IV at discharge via PICC line however we were not able to secure home heath for him secondary to uninsured status. He was given a dose of long acting Oritavancin prior to discharge and continued on flagyl.   He is here today for 10 day follow up. No fevers or chills. Has had some bloody  appearing drainage in the right foot. Was debrided with orthopedic team recently. He has follow up with them today. Has not been in the shower yet as he is scared with open wounds.   Blood sugars have been > 200s in the mornings with fasting check. Today was 180.    Review of Systems  Constitutional: Negative for chills, fever, malaise/fatigue and weight loss.  Respiratory: Negative for cough and shortness of breath.   Cardiovascular: Negative.   Gastrointestinal: Negative for abdominal pain, diarrhea and vomiting.  Musculoskeletal: Negative for joint pain, myalgias and neck pain.  Skin: Negative for rash.  Neurological: Negative for headaches.  Psychiatric/Behavioral: Negative for depression.    Past Medical History:  Diagnosis Date  . Diabetes mellitus without complication ([place])   . Hypertension     Outpatient Medications Prior to Visit  Medication Sig Dispense Refill  . blood glucose meter kit and supplies Dispense based on patient and insurance preference. Use up to four times daily as directed. (FOR ICD-10 E10.9, E11.9). 1 each 0  . insulin aspart protamine - aspart (NOVOLOG 70/30 MIX) (70-30) 100 UNIT/ML FlexPen Inject 0.3 mLs (30 Units total) into the skin 2 (two) times daily. 15 mL 0  . nystatin cream (MYCOSTATIN) Apply topically 2 (two) times daily. 30 g 0  . sertraline (ZOLOFT) 25 MG tablet Take 1 tablet (25 mg total) by mouth daily. 30 tablet 0  . metroNIDAZOLE (FLAGYL) 500 MG tablet Take 1  tablet (500 mg total) by mouth 3 (three) times daily for 14 days. 42 tablet 0   No facility-administered medications prior to visit.     Allergies  Allergen Reactions  . Milk-Related Compounds Diarrhea  . Iodine     Patient endorses unspecified reaction to iodine in past    Social History   Tobacco Use  . Smoking status: Never Smoker  . Smokeless tobacco: Never Used  Vaping Use  . Vaping Use: Never used  Substance Use Topics  . Alcohol use: No    Alcohol/week: 0.0  standard drinks  . Drug use: No    Family History  Problem Relation Age of Onset  . Diabetes Mother   . Hyperlipidemia Father   . Hypertension Father     Social History   Substance and Sexual Activity  Sexual Activity Not on file     Objective:   Vitals:   03/21/20 0857  BP: 117/77  Pulse: (!) 103  SpO2: 99%   There is no height or weight on file to calculate BMI.  Physical Exam Vitals reviewed.  Constitutional:      Appearance: Normal appearance. He is not ill-appearing.     Comments: Seated comfortably in chair.   HENT:     Head: Normocephalic.     Mouth/Throat:     Mouth: Mucous membranes are moist.     Pharynx: Oropharynx is clear.  Eyes:     General: No scleral icterus. Pulmonary:     Effort: Pulmonary effort is normal. No respiratory distress.  Musculoskeletal:        General: Normal range of motion.     Cervical back: Normal range of motion.     Comments: As pictured below.   Skin:    Coloration: Skin is not jaundiced or pale.  Neurological:     Mental Status: He is alert and oriented to person, place, and time.  Psychiatric:        Mood and Affect: Mood normal.        Judgment: Judgment normal.    R foot with plantar ulceration under the great toe. No drainage, erythema or induration. Purple-ish discoloration with feet in dependent positioning.    Left foot with surgical incision intact. Crusted bloody drainage but appears to be well approximated. Sutures a bit embedded. Dry flaky skin noted. No tenderness with palpation. No warmth.     Lab Results Lab Results  Component Value Date   WBC 6.8 03/08/2020   HGB 12.9 (L) 03/08/2020   HCT 39.7 03/08/2020   MCV 84.3 03/08/2020   PLT 349 03/08/2020    Lab Results  Component Value Date   CREATININE 0.58 (L) 03/08/2020   BUN 6 03/08/2020   NA 136 03/08/2020   K 3.7 03/08/2020   CL 102 03/08/2020   CO2 23 03/08/2020    Lab Results  Component Value Date   ALT 11 03/03/2020   AST 13 (L)  03/03/2020   ALKPHOS 66 03/03/2020   BILITOT 0.8 03/03/2020    Sed Rate (mm/hr)  Date Value  03/05/2020 81 (H)   CRP (mg/dL)  Date Value  03/05/2020 9.0 (H)     Assessment & Plan:   Problem List Items Addressed This Visit      High   Sepsis due to group B Streptococcus with acute renal failure ([place])    No concern for relapsing bacteremia today. Defer blood cultures.       Osteomyelitis of left foot ([place]) - Primary  Relevant Orders   Sedimentation rate   C-reactive protein   Abscess of great toe, left    S/P amputation of the great toe. Appears to be healing well. Has FU with Ortho today.         Unprioritized   Right foot ulcer, limited to breakdown of skin (HCC)    S/P 3 weeks IV antibiotics - Clean overall without inflammation but has some thin drainage. Will plan for 2 weeks of augmentin as long as inflammatory markers appear to be moving in the right direction. Stop metronidazole and continue augmentin BID alone.         RTC in 2-3 weeks with Dr. Tommy Medal for follow up.   Janene Madeira, MSN, NP-C [facility] for Infectious [place] Pager: [phone] Office: [phone]  03/21/20  9:31 AM

## 2020-03-21 NOTE — Assessment & Plan Note (Signed)
S/P 3 weeks IV antibiotics - Clean overall without inflammation but has some thin drainage. Will plan for 2 weeks of augmentin as long as inflammatory markers appear to be moving in the right direction. Stop metronidazole and continue augmentin BID alone.

## 2020-03-21 NOTE — Assessment & Plan Note (Signed)
No concern for relapsing bacteremia today. Defer blood cultures.

## 2020-03-21 NOTE — Patient Instructions (Signed)
STOP the metronidazole (the antibiotic three times a day)  START the Augmentin twice a day with food. Can cause upset stomach and diarrhea. Call to let me know if these trouble you. [phone].   Please stop by the lab on your way out. Depending on the results we may want to consider one more dose of the IV therapy. Will call you and let you know.    Please plan to return in 2-3 weeks with Dr. Daiva Eves to evaluate your foot again.  Please call us if you experience any increased drainage, especially if it smells badly, fevers, chills or worsened wounds/incisions

## 2020-03-21 NOTE — Progress Notes (Signed)
Office Visit Note   Patient: [name]           Date of Birth: [birth year]           MRN: [id number] Visit Date: 03/21/2020              Requested by: [name], MD [address] [place],  [place] [address] PCP: [name], MD  Chief Complaint  Patient presents with  . Left Foot - Routine Post Op    03/06/20 left GT amputation   . Right Foot - Follow-up    GT ulcer follow up       HPI: Patient presents today 2 weeks status post left great toe amputation.  He is doing very well and he has been switched over to an oral antibiotic by infectious disease.  He is wearing a postop shoe and feels things are looking better.  Denies any fever or chills.  Assessment & Plan: Visit Diagnoses: No diagnosis found.  Plan: Given both of the patient's feet I think he would do well in compression stockings especially given the fungus appearance of his feet.  Have given him a prescription for these.  Continue to do daily dressing changes we will remove sutures at 1 week  Follow-Up Instructions: No follow-ups on file.   Ortho Exam  Patient is alert, oriented, no adenopathy, well-dressed, normal affect, normal respiratory effort. Left great toe well apposed wound edges sutures are in place.  No erythema or cellulitis.  No foul odor no drainage  Right great toe does have a small ulcer that does not probe deeply.  This was trimmed back to healthy edges no surrounding cellulitis or foul odor.  Imaging: No results found. No images are attached to the encounter.  Labs: Lab Results  Component Value Date   HGBA1C 11.8 (H) 03/03/2020   HGBA1C 11.4 (A) 05/02/2018   HGBA1C 11.3 (A) 01/25/2018   ESRSEDRATE 81 (H) 03/05/2020   CRP 9.0 (H) 03/05/2020   REPTSTATUS 03/09/2020 FINAL 03/04/2020   CULT  03/04/2020    NO GROWTH 5 DAYS Performed at [facility], [address], [place], [place] [address]    LABORGA GROUP B STREP(S.AGALACTIAE)ISOLATED 03/02/2020      Lab Results  Component Value Date   ALBUMIN 2.5 (L) 03/03/2020   ALBUMIN 3.1 (L) 03/02/2020   ALBUMIN 3.6 04/04/2019    No results found for: MG No results found for: VD25OH  No results found for: PREALBUMIN CBC EXTENDED Latest Ref Rng & Units 03/08/2020 03/07/2020 03/06/2020  WBC 4.0 - 10.5 K/uL 6.8 7.4 8.6  RBC 4.22 - 5.81 MIL/uL 4.71 4.74 4.65  HGB 13.0 - 17.0 g/dL 12.9(L) 12.7(L) 12.3(L)  HCT 39 - 52 % 39.7 39.6 38.5(L)  PLT 150 - 400 K/uL 349 322 280  NEUTROABS 1.7 - 7.7 K/uL 3.9 4.8 5.6  LYMPHSABS 0.7 - 4.0 K/uL 1.9 1.8 2.1     Body mass index is 41.69 kg/m.  Orders:  No orders of the defined types were placed in this encounter.  No orders of the defined types were placed in this encounter.    Procedures: No procedures performed  Clinical Data: No additional findings.  ROS:  All other systems negative, except as noted in the HPI. Review of Systems  Objective: Vital Signs: Ht 6\' 1"  (1.854 m)   Wt (!) 316 lb (143.3 kg)   BMI 41.69 kg/m   Specialty Comments:  No specialty comments available.  PMFS History:  Patient Active Problem List   Diagnosis Date Noted  . Anaerobic bacteremia 03/07/2020  . Major depressive disorder, recurrent episode, moderate (HCC) 03/04/2020  . Abscess of great toe, left   . Osteomyelitis of left foot (HCC)   . Sepsis due to group B Streptococcus with acute renal failure (HCC)   . Cellulitis of left toe 03/03/2020  . Diabetic ketoacidosis (HCC) 03/03/2020  . DKA (diabetic ketoacidosis) (HCC) 03/03/2020  . Right foot ulcer, limited to breakdown of skin (HCC) 06/06/2019  . Non-pressure chronic ulcer of other part of left foot limited to breakdown of skin (HCC) 06/06/2019  . Uncontrolled type 2 diabetes mellitus with hyperglycemia (HCC) 05/02/2018  . 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 Venous insufficiency of both lower extremities 07/30/2017  . Obesity, Class III, BMI 40-49.9 (morbid obesity) (HCC)  07/30/2017  . Essential hypertension 09/03/2016   Past Medical History:  Diagnosis Date  . Diabetes mellitus without complication (HCC)   . Hypertension     Family History  Problem Relation Age of Onset  . Diabetes Mother   . Hyperlipidemia Father   . Hypertension Father     Past Surgical History:  Procedure Laterality Date  . AMPUTATION Left 03/06/2020   Procedure: LEFT GREAT TOE AMPUTATION;  Surgeon: [name], MD;  Location: [facility] OR;  Service: Orthopedics;  Laterality: Left;   Social History   Occupational History  . Occupation: Unemployed  Tobacco Use  . Smoking status: Never Smoker  . Smokeless tobacco: Never Used  Vaping Use  . Vaping Use: Never used  Substance and Sexual Activity  . Alcohol use: No    Alcohol/week: 0.0 standard drinks  . Drug use: No  . Sexual activity: Not on file

## 2020-03-22 LAB — SEDIMENTATION RATE: Sed Rate: 48 mm/h — ABNORMAL HIGH (ref 0–15)

## 2020-03-22 LAB — C-REACTIVE PROTEIN: CRP: 5.6 mg/L (ref <8.0)

## 2020-03-26 ENCOUNTER — Other Ambulatory Visit: Payer: Self-pay

## 2020-03-26 ENCOUNTER — Ambulatory Visit: Payer: Self-pay | Attending: Internal Medicine | Admitting: Internal Medicine

## 2020-03-26 ENCOUNTER — Encounter: Payer: Self-pay | Admitting: Internal Medicine

## 2020-03-26 VITALS — BP 127/85 | HR 86 | Resp 16 | Ht 73.0 in | Wt 331.4 lb

## 2020-03-26 DIAGNOSIS — F321 Major depressive disorder, single episode, moderate: Secondary | ICD-10-CM

## 2020-03-26 DIAGNOSIS — Z09 Encounter for follow-up examination after completed treatment for conditions other than malignant neoplasm: Secondary | ICD-10-CM

## 2020-03-26 DIAGNOSIS — L97512 Non-pressure chronic ulcer of other part of right foot with fat layer exposed: Secondary | ICD-10-CM

## 2020-03-26 DIAGNOSIS — E1165 Type 2 diabetes mellitus with hyperglycemia: Secondary | ICD-10-CM

## 2020-03-26 DIAGNOSIS — Z9229 Personal history of other drug therapy: Secondary | ICD-10-CM | POA: Insufficient documentation

## 2020-03-26 DIAGNOSIS — Z89412 Acquired absence of left great toe: Secondary | ICD-10-CM

## 2020-03-26 DIAGNOSIS — Z2821 Immunization not carried out because of patient refusal: Secondary | ICD-10-CM | POA: Insufficient documentation

## 2020-03-26 LAB — GLUCOSE, POCT (MANUAL RESULT ENTRY): POC Glucose: 330 mg/dl — AB (ref 70–99)

## 2020-03-26 MED ORDER — SERTRALINE HCL 50 MG PO TABS: 50.0000 mg | ORAL_TABLET | Freq: Every day | ORAL | 3 refills | Status: DC

## 2020-03-26 MED ORDER — METFORMIN HCL 500 MG PO TABS: 500.0000 mg | ORAL_TABLET | Freq: Every day | ORAL | 3 refills | Status: DC

## 2020-03-26 MED ORDER — INSULIN ASPART PROT & ASPART (70-30 MIX) 100 UNIT/ML PEN: 35.0000 [IU] | PEN_INJECTOR | Freq: Two times a day (BID) | SUBCUTANEOUS | 4 refills | Status: DC

## 2020-03-26 NOTE — Patient Instructions (Addendum)
Increase insulin to 35 units twice a day.  Add Metformin 500 mg daily. Continue to monitor blood sugars. Goal for blood sugars before meals is 90-130.   Try to schedule an eye exam for your self.  Increase Zoloft to 50 mg daily once you are through with the 25 mg tablets.  Our License Clinical Social Worker will contact you.    Diabetes Mellitus and Nutrition, Adult When you have diabetes (diabetes mellitus), it is very important to have healthy eating habits because your blood sugar (glucose) levels are greatly affected by what you eat and drink. Eating healthy foods in the appropriate amounts, at about the same times every day, can help you:  Control your blood glucose.  Lower your risk of heart disease.  Improve your blood pressure.  Reach or maintain a healthy weight. Every person with diabetes is different, and each person has different needs for a meal plan. Your health care provider may recommend that you work with a diet and nutrition specialist (dietitian) to make a meal plan that is best for you. Your meal plan may vary depending on factors such as:  The calories you need.  The medicines you take.  Your weight.  Your blood glucose, blood pressure, and cholesterol levels.  Your activity level.  Other health conditions you have, such as heart or kidney disease. How do carbohydrates affect me? Carbohydrates, also called carbs, affect your blood glucose level more than any other type of food. Eating carbs naturally raises the amount of glucose in your blood. Carb counting is a method for keeping track of how many carbs you eat. Counting carbs is important to keep your blood glucose at a healthy level, especially if you use insulin or take certain oral diabetes medicines. It is important to know how many carbs you can safely have in each meal. This is different for every person. Your dietitian can help you calculate how many carbs you should have at each meal and for each  snack. Foods that contain carbs include:  Bread, cereal, rice, pasta, and crackers.  Potatoes and corn.  Peas, beans, and lentils.  Milk and yogurt.  Fruit and juice.  Desserts, such as cakes, cookies, ice cream, and candy. How does alcohol affect me? Alcohol can cause a sudden decrease in blood glucose (hypoglycemia), especially if you use insulin or take certain oral diabetes medicines. Hypoglycemia can be a life-threatening condition. Symptoms of hypoglycemia (sleepiness, dizziness, and confusion) are similar to symptoms of having too much alcohol. If your health care provider says that alcohol is safe for you, follow these guidelines:  Limit alcohol intake to no more than 1 drink per day for nonpregnant women and 2 drinks per day for men. One drink equals 12 oz of beer, 5 oz of wine, or 1 oz of hard liquor.  Do not drink on an empty stomach.  Keep yourself hydrated with water, diet soda, or unsweetened iced tea.  Keep in mind that regular soda, juice, and other mixers may contain a lot of sugar and must be counted as carbs. What are tips for following this plan?  Reading food labels  Start by checking the serving size on the "Nutrition Facts" label of packaged foods and drinks. The amount of calories, carbs, fats, and other nutrients listed on the label is based on one serving of the item. Many items contain more than one serving per package.  Check the total grams (g) of carbs in one serving. You can calculate the  number of servings of carbs in one serving by dividing the total carbs by 15. For example, if a food has 30 g of total carbs, it would be equal to 2 servings of carbs.  Check the number of grams (g) of saturated and trans fats in one serving. Choose foods that have low or no amount of these fats.  Check the number of milligrams (mg) of salt (sodium) in one serving. Most people should limit total sodium intake to less than 2,300 mg per day.  Always check the  nutrition information of foods labeled as "low-fat" or "nonfat". These foods may be higher in added sugar or refined carbs and should be avoided.  Talk to your dietitian to identify your daily goals for nutrients listed on the label. Shopping  Avoid buying canned, premade, or processed foods. These foods tend to be high in fat, sodium, and added sugar.  Shop around the outside edge of the grocery store. This includes fresh fruits and vegetables, bulk grains, fresh meats, and fresh dairy. Cooking  Use low-heat cooking methods, such as baking, instead of high-heat cooking methods like deep frying.  Cook using healthy oils, such as olive, canola, or sunflower oil.  Avoid cooking with butter, cream, or high-fat meats. Meal planning  Eat meals and snacks regularly, preferably at the same times every day. Avoid going long periods of time without eating.  Eat foods high in fiber, such as fresh fruits, vegetables, beans, and whole grains. Talk to your dietitian about how many servings of carbs you can eat at each meal.  Eat 4-6 ounces (oz) of lean protein each day, such as lean meat, chicken, fish, eggs, or tofu. One oz of lean protein is equal to: ? 1 oz of meat, chicken, or fish. ? 1 egg. ?  cup of tofu.  Eat some foods each day that contain healthy fats, such as avocado, nuts, seeds, and fish. Lifestyle  Check your blood glucose regularly.  Exercise regularly as told by your health care provider. This may include: ? 150 minutes of moderate-intensity or vigorous-intensity exercise each week. This could be brisk walking, biking, or water aerobics. ? Stretching and doing strength exercises, such as yoga or weightlifting, at least 2 times a week.  Take medicines as told by your health care provider.  Do not use any products that contain nicotine or tobacco, such as cigarettes and e-cigarettes. If you need help quitting, ask your health care provider.  Work with a Social worker or diabetes  educator to identify strategies to manage stress and any emotional and social challenges. Questions to ask a health care provider  Do I need to meet with a diabetes educator?  Do I need to meet with a dietitian?  What number can I call if I have questions?  When are the best times to check my blood glucose? Where to find more information:  American Diabetes Association: diabetes.org  Academy of Nutrition and Dietetics: www.eatright.[employer] of Diabetes and Digestive and Kidney Diseases (NIH): [employer] Summary  A healthy meal plan will help you control your blood glucose and maintain a healthy lifestyle.  Working with a diet and nutrition specialist (dietitian) can help you make a meal plan that is best for you.  Keep in mind that carbohydrates (carbs) and alcohol have immediate effects on your blood glucose levels. It is important to count carbs and to use alcohol carefully. This information is not intended to replace advice given to you by your health care  provider. Make sure you discuss any questions you have with your health care provider. Document Revised: 04/23/2017 Document Reviewed: 06/15/2016 Elsevier Patient Education  2020 [employer].

## 2020-03-26 NOTE — Progress Notes (Signed)
Patient ID: [name], male    DOB: June 27, 1980  MRN: [id number]  CC: Hospitalization Follow-up   Subjective: [name] is a 39 y.o. male who presents for new pt visit and hosp f/u His concerns today include:  Pt with hx of DM type 2, DM foot ulcers, amputation LT big toe (02/2020), MDD, obesity, HTN  Previous PCP was Dr. Mitchel Honour at [facility]. Currently no insurance.  Patient hospitalized 10/9-15/2021 with osteomyelitis of the left big toe and GNR and Group B Strep sepsis.  He was found to be in DKA with acute renal failure.  He underwent amputation of the left big toe.  ID recommended 6 weeks course of IV antibiotics.  However patient was unable to have PICC line placement due to lack of insurance therefore he was discharged on oral antibiotics.  DKA was addressed appropriately.  Acute renal failure resolved.  Patient was reported as having active suicidal ideation on admission.  He was evaluated by psychiatry.  She was started on Zoloft.  Mood was stable.  He was discharged home in a stable condition.  Today: Status post left big toe amputation/sepsis: Seen by the infectious disease nurse practitioner last week.  Placed on Augmentin 2 weeks.  Follow-up again in 2 to 3 weeks.  He reports that the surgical site is healing well.  He has minimal drainage.  He does dressing changes herself every day.  Saw orthopedics post hospitalization on 03/14/2020.  The importance of offloading his weightbearing on both of his feet was stressed.  He has a follow-up visit with them later this week.  DM type II: Last A1c was 11.8 on 03/03/2020.  Blood sugar today is 330.  Checks blood sugars 2-3 times a day.  Range before breakfast is 80-250, before dinner is in the mid 200s.  He is on NovoLog 70/30 insulin 30 units twice a day. -Doing better with eating habits.  Eating more solids, drinking less sodas.  Saw the nutritionist while in the hospital. Last eye exam was years ago.  Denies any  blurred vision. Denies any numbness in the feet.  Depression: Reports being depressed his whole life.  Was on medications including Zoloft and Adderall back in the early 2000 when he was seeing a psychiatrist.  Taking Zoloft 25 mg daily from hospital discharge.  He feels it is helping.  He would like to be plugged into mental health services.  Denies suicidal ideation..  Past social, family history, surgical history reviewed. Lab Results  Component Value Date   HGBA1C 11.8 (H) 03/03/2020    Patient Active Problem List   Diagnosis Date Noted   Status post amputation of left great toe ([place]) 03/26/2020   Influenza vaccination declined 03/26/2020   23-polyvalent pneumococcal polysaccharide vaccine declined 03/26/2020   COVID-19 vaccine series completed 03/26/2020   Moderate major depression, single episode ([place]) 03/26/2020   Anaerobic bacteremia 03/07/2020   Major depressive disorder, recurrent episode, moderate ([place]) 03/04/2020   Abscess of great toe, left    Osteomyelitis of left foot ([place])    Sepsis due to group B Streptococcus with acute renal failure ([place])    Cellulitis of left toe 03/03/2020   Diabetic ketoacidosis ([place]) 03/03/2020   DKA (diabetic ketoacidosis) ([place]) 03/03/2020   Ulcer of right foot with fat layer exposed ([place]) 06/06/2019   Non-pressure chronic ulcer of other part of left foot limited to breakdown of skin ([place]) 06/06/2019   Uncontrolled type 2 diabetes mellitus with hyperglycemia ([place]) 05/02/2018  Diabetic ulcer of left great toe ([place]) 07/30/2017   Venous insufficiency of both lower extremities 07/30/2017   Obesity, Class III, BMI 40-49.9 (morbid obesity) ([name]) 07/30/2017   Essential hypertension 09/03/2016     Current Outpatient Medications on File Prior to Visit  Medication Sig Dispense Refill   amoxicillin-clavulanate (AUGMENTIN) 875-125 MG tablet Take 1 tablet by mouth 2 (two) times daily for 14 days. 28 tablet 0   blood glucose  meter kit and supplies Dispense based on patient and insurance preference. Use up to four times daily as directed. (FOR ICD-10 E10.9, E11.9). 1 each 0   nystatin cream (MYCOSTATIN) Apply topically 2 (two) times daily. 30 g 0   sertraline (ZOLOFT) 25 MG tablet Take 1 tablet (25 mg total) by mouth daily. 30 tablet 0   No current facility-administered medications on file prior to visit.    Allergies  Allergen Reactions   Milk-Related Compounds Diarrhea   Iodine     Patient endorses unspecified reaction to iodine in past    Social History   Socioeconomic History   Marital status: Single    Spouse name: Not on file   Number of children: Not on file   Years of education: Not on file   Highest education level: Not on file  Occupational History   Occupation: Unemployed  Tobacco Use   Smoking status: Never Smoker   Smokeless tobacco: Never Used  Scientific laboratory technician Use: Never used  Substance and Sexual Activity   Alcohol use: No    Alcohol/week: 0.0 standard drinks   Drug use: No   Sexual activity: Not on file  Other Topics Concern   Not on file  Social History Narrative   Live with parents in [place].    Social Determinants of Health   Financial Resource Strain:    Difficulty of Paying Living Expenses: Not on file  Food Insecurity:    Worried About Charity fundraiser in the Last Year: Not on file   [employer] of Food in the Last Year: Not on file  Transportation Needs:    Lack of Transportation (Medical): Not on file   Lack of Transportation (Non-Medical): Not on file  Physical Activity:    Days of Exercise per Week: Not on file   Minutes of Exercise per Session: Not on file  Stress:    Feeling of Stress : Not on file  Social Connections:    Frequency of Communication with Friends and Family: Not on file   Frequency of Social Gatherings with Friends and Family: Not on file   Attends Religious Services: Not on file   Active Member of  Clubs or Organizations: Not on file   Attends Archivist Meetings: Not on file   Marital Status: Not on file  Intimate Partner Violence:    Fear of Current or Ex-Partner: Not on file   Emotionally Abused: Not on file   Physically Abused: Not on file   Sexually Abused: Not on file    Family History  Problem Relation Age of Onset   Diabetes Mother    Hyperlipidemia Father    Hypertension Father     Past Surgical History:  Procedure Laterality Date   AMPUTATION Left 03/06/2020   Procedure: LEFT GREAT TOE AMPUTATION;  Surgeon: Newt Minion, MD;  Location: [facility];  Service: Orthopedics;  Laterality: Left;    ROS: Review of Systems Negative except as stated above  PHYSICAL EXAM: BP 127/85    Pulse  86    Resp 16    Ht '6\' 1"'  (1.854 m)    Wt (!) 331 lb 6.4 oz (150.3 kg)    SpO2 97%    BMI 43.72 kg/m   Physical Exam   General appearance - alert, well appearing, middle-age obese Caucasian male and in no distress Mental status - normal mood, behavior, speech, dress, motor activity, and thought processes Neck - supple, no significant adenopathy Chest - clear to auscultation, no wheezes, rales or rhonchi, symmetric air entry Heart - normal rate, regular rhythm, normal S1, S2, no murmurs, rubs, clicks or gallops Extremities -no lower extremity edema. Diabetic Foot Exam - Simple   Simple Foot Form Visual Inspection See comments: Yes Sensation Testing See comments: Yes Pulse Check Posterior Tibialis and Dorsalis pulse intact bilaterally: Yes Comments Left big toe has been amputated.  He has sutures in place.  No significant drainage noted on dressing that was removed.  No dehiscence of wound noted.  He has surrounding erythema. He has a 2 cm also on the plantar surface of the right big toe.  No drainage noted.  No foul odor.          Depression screen [facility] 2/9 03/26/2020 03/21/2020 04/04/2019  Decreased Interest 3 0 2  Down, Depressed, Hopeless 2 0 3    PHQ - 2 Score 5 0 5  Altered sleeping 3 - 3  Tired, decreased energy 3 - 3  Change in appetite 1 - 1  Feeling bad or failure about yourself  1 - 3  Trouble concentrating 1 - 2  Moving slowly or fidgety/restless 0 - 2  Suicidal thoughts 0 - 1  PHQ-9 Score 14 - 20  Difficult doing work/chores - - -  Some recent data might be hidden   GAD 7 : Generalized Anxiety Score 03/26/2020 11/13/2017  Nervous, Anxious, on Edge 1 2  Control/stop worrying 1 3  Worry too much - different things 1 3  Trouble relaxing 1 2  Restless 0 0  Easily annoyed or irritable 1 3  Afraid - awful might happen 0 3  Total GAD 7 Score 5 16  Anxiety Difficulty - Extremely difficult     CMP Latest Ref Rng & Units 03/08/2020 03/07/2020 03/06/2020  Glucose 70 - 99 mg/dL 185(H) 183(H) 211(H)  BUN 6 - 20 mg/dL 6 <5(L) <5(L)  Creatinine 0.61 - 1.24 mg/dL 0.58(L) 0.55(L) 0.54(L)  Sodium 135 - 145 mmol/L 136 137 135  Potassium 3.5 - 5.1 mmol/L 3.7 3.8 3.6  Chloride 98 - 111 mmol/L 102 104 104  CO2 22 - 32 mmol/L '23 26 24  ' Calcium 8.9 - 10.3 mg/dL 8.8(L) 8.4(L) 8.5(L)  Total Protein 6.5 - 8.1 g/dL - - -  Total Bilirubin 0.3 - 1.2 mg/dL - - -  Alkaline Phos 38 - 126 U/L - - -  AST 15 - 41 U/L - - -  ALT 0 - 44 U/L - - -   Lipid Panel  No results found for: CHOL, TRIG, HDL, CHOLHDL, VLDL, LDLCALC, LDLDIRECT  CBC    Component Value Date/Time   WBC 6.8 03/08/2020 0002   RBC 4.71 03/08/2020 0002   HGB 12.9 (L) 03/08/2020 0002   HGB 15.0 06/04/2016 1030   HCT 39.7 03/08/2020 0002   HCT 45.8 06/04/2016 1030   PLT 349 03/08/2020 0002   PLT 254 06/04/2016 1030   MCV 84.3 03/08/2020 0002   MCV 82.6 05/02/2018 1521   MCV 83 06/04/2016 1030   MCH  27.4 03/08/2020 0002   MCHC 32.5 03/08/2020 0002   RDW 13.8 03/08/2020 0002   RDW 14.1 06/04/2016 1030   LYMPHSABS 1.9 03/08/2020 0002   LYMPHSABS 3.3 (H) 06/04/2016 1030   MONOABS 0.7 03/08/2020 0002   EOSABS 0.1 03/08/2020 0002   EOSABS 0.1 06/04/2016 1030    BASOSABS 0.1 03/08/2020 0002   BASOSABS 0.0 06/04/2016 1030    ASSESSMENT AND PLAN: 1. Hospital discharge follow-up   2. Uncontrolled type 2 diabetes mellitus with hyperglycemia (HCC) Recommend increase the NovoLog 7030 to 35 units twice a day.  Add low-dose Metformin.  Dietary counseling given.  Printed information also provided. - POCT glucose (manual entry) - Microalbumin / creatinine urine ratio - metFORMIN (GLUCOPHAGE) 500 MG tablet; Take 1 tablet (500 mg total) by mouth daily with breakfast.  Dispense: 30 tablet; Refill: 3 - insulin aspart protamine - aspart (NOVOLOG 70/30 MIX) (70-30) 100 UNIT/ML FlexPen; Inject 0.35 mLs (35 Units total) into the skin 2 (two) times daily.  Dispense: 15 mL; Refill: 4 - Comprehensive metabolic panel - Lipid panel  3. Status post amputation of left great toe (HCC) -Appears to be healing.  Followed by infectious disease and orthopedics. Continue dressing changes.  Keep follow-up appointments with Ortho later this week.  4. Ulcer of right foot with fat layer exposed ([place]) Followed by Ortho.  Discussed good diabetic foot care.  5. Morbid obesity ([place]) Dietary counseling given.  He is not able to do much right now due to him being limited in weightbearing to allow both feet to heal.  6. Influenza vaccination declined This was recommended.  Patient declined.  7. 23-polyvalent pneumococcal polysaccharide vaccine declined This was recommended.  Patient declined.  8. COVID-19 vaccine series completed He will bring copy of his card with him on next visit for [name] to update this in his record.  9. Moderate major depression, single episode ([place]) He will continue current bottle of Zoloft 25 mg until he completes it around the middle of this month.  We will then increase it to 50 mg daily.  Message sent to our social worker to touch base with him.  Will refer to behavioral health. - sertraline (ZOLOFT) 50 MG tablet; Take 1 tablet (50 mg total) by mouth  daily.  Dispense: 30 tablet; Refill: 3    Patient was given the opportunity to ask questions.  Patient verbalized understanding of the plan and was able to repeat key elements of the plan.   Orders Placed This Encounter  Procedures   Microalbumin / creatinine urine ratio   Comprehensive metabolic panel   Lipid panel   POCT glucose (manual entry)     Requested Prescriptions   Signed Prescriptions Disp Refills   sertraline (ZOLOFT) 50 MG tablet 30 tablet 3    Sig: Take 1 tablet (50 mg total) by mouth daily.   metFORMIN (GLUCOPHAGE) 500 MG tablet 30 tablet 3    Sig: Take 1 tablet (500 mg total) by mouth daily with breakfast.   insulin aspart protamine - aspart (NOVOLOG 70/30 MIX) (70-30) 100 UNIT/ML FlexPen 15 mL 4    Sig: Inject 0.35 mLs (35 Units total) into the skin 2 (two) times daily.    Return in about 2 months (around 05/26/2020) for Give appt with [facility] in 2 wks for diabetes recheck.  Karle Plumber, MD, FACP

## 2020-03-27 ENCOUNTER — Telehealth: Payer: Self-pay

## 2020-03-27 ENCOUNTER — Other Ambulatory Visit: Payer: Self-pay | Admitting: Internal Medicine

## 2020-03-27 DIAGNOSIS — E1169 Type 2 diabetes mellitus with other specified complication: Secondary | ICD-10-CM | POA: Insufficient documentation

## 2020-03-27 DIAGNOSIS — E785 Hyperlipidemia, unspecified: Secondary | ICD-10-CM | POA: Insufficient documentation

## 2020-03-27 LAB — COMPREHENSIVE METABOLIC PANEL
ALT: 21 IU/L (ref 0–44)
AST: 15 IU/L (ref 0–40)
Albumin/Globulin Ratio: 1.2 (ref 1.2–2.2)
Albumin: 4.3 g/dL (ref 4.0–5.0)
Alkaline Phosphatase: 90 IU/L (ref 44–121)
BUN/Creatinine Ratio: 14 (ref 9–20)
BUN: 12 mg/dL (ref 6–24)
Bilirubin Total: 0.2 mg/dL (ref 0.0–1.2)
CO2: 26 mmol/L (ref 20–29)
Calcium: 9.8 mg/dL (ref 8.7–10.2)
Chloride: 98 mmol/L (ref 96–106)
Creatinine, Ser: 0.85 mg/dL (ref 0.76–1.27)
GFR calc Af Amer: 126 mL/min/{1.73_m2} (ref >59)
GFR calc non Af Amer: 109 mL/min/{1.73_m2} (ref >59)
Globulin, Total: 3.6 g/dL (ref 1.5–4.5)
Glucose: 288 mg/dL — ABNORMAL HIGH (ref 65–99)
Potassium: 4.6 mmol/L (ref 3.5–5.2)
Sodium: 139 mmol/L (ref 134–144)
Total Protein: 7.9 g/dL (ref 6.0–8.5)

## 2020-03-27 LAB — MICROALBUMIN / CREATININE URINE RATIO
Creatinine, Urine: 104.9 mg/dL
Microalb/Creat Ratio: 5 mg/g creat (ref 0–29)
Microalbumin, Urine: 5.2 ug/mL

## 2020-03-27 LAB — LIPID PANEL
Chol/HDL Ratio: 6.8 ratio — ABNORMAL HIGH (ref 0.0–5.0)
Cholesterol, Total: 224 mg/dL — ABNORMAL HIGH (ref 100–199)
HDL: 33 mg/dL — ABNORMAL LOW (ref >39)
LDL Chol Calc (NIH): 140 mg/dL — ABNORMAL HIGH (ref 0–99)
Triglycerides: 282 mg/dL — ABNORMAL HIGH (ref 0–149)
VLDL Cholesterol Cal: 51 mg/dL — ABNORMAL HIGH (ref 5–40)

## 2020-03-27 MED ORDER — PRAVASTATIN SODIUM 20 MG PO TABS: 20.0000 mg | ORAL_TABLET | Freq: Every day | ORAL | 3 refills | Status: DC

## 2020-03-27 NOTE — Telephone Encounter (Signed)
Contacted pt to go over lab results pt is aware and doesn't have any questions or concerns 

## 2020-03-27 NOTE — Progress Notes (Signed)
Let patient know that his kidney and liver function tests are normal.  His LDL cholesterol is elevated at 140 with goal being less than 70 in patients with diabetes.  High cholesterol can increase his risk for heart attack and strokes.  I recommend starting a medication called Pravastatin to help lower his wrist.  I have sent the prescription to his pharmacy.

## 2020-03-27 NOTE — Progress Notes (Signed)
pravachol

## 2020-03-28 ENCOUNTER — Ambulatory Visit (INDEPENDENT_AMBULATORY_CARE_PROVIDER_SITE_OTHER): Payer: Self-pay | Admitting: Physician Assistant

## 2020-03-28 ENCOUNTER — Encounter: Payer: Self-pay | Admitting: Physician Assistant

## 2020-03-28 VITALS — Ht 73.0 in | Wt 331.0 lb

## 2020-03-28 DIAGNOSIS — L97511 Non-pressure chronic ulcer of other part of right foot limited to breakdown of skin: Secondary | ICD-10-CM

## 2020-03-28 NOTE — Progress Notes (Signed)
Office Visit Note   Patient: [name]           Date of Birth: 1981/01/19           MRN: [id number] Visit Date: 03/28/2020              Requested by: [name], MD [address] [place],  [place] [address] PCP: Marcine Matar, MD  Chief Complaint  Patient presents with  . Left Foot - Routine Post Op    03/06/20 left GT amputation   . Right Foot - Wound Check      HPI: Patient is 3 weeks status post left great toe amputation.  With regards to that he is doing quite well.  He also has a chronic ulcer on the bottom of his right great toe.  Assessment & Plan: Visit Diagnoses: No diagnosis found.  Plan: New donut was provided for the right.  Surgical sutures were harvested today follow-up in 2 weeks.  Follow-Up Instructions: No follow-ups on file.   Ortho Exam  Patient is alert, oriented, no adenopathy, well-dressed, normal affect, normal respiratory effort. Left amputation stump is healed there is no cellulitis minimal drainage well apposed wound edges Right great toe ulcer does not probe to bone.  Has some macerated skin that was debrided.  To healthy bleeding tissue.no drainage or cellulitis  Imaging: No results found. No images are attached to the encounter.  Labs: Lab Results  Component Value Date   HGBA1C 11.8 (H) 03/03/2020   HGBA1C 11.4 (A) 05/02/2018   HGBA1C 11.3 (A) 01/25/2018   ESRSEDRATE 48 (H) 03/21/2020   ESRSEDRATE 81 (H) 03/05/2020   CRP 5.6 03/21/2020   CRP 9.0 (H) 03/05/2020   REPTSTATUS 03/09/2020 FINAL 03/04/2020   CULT  03/04/2020    NO GROWTH 5 DAYS Performed at [facility], [address], [place], [place] [address]    LABORGA GROUP B STREP(S.AGALACTIAE)ISOLATED 03/02/2020     Lab Results  Component Value Date   ALBUMIN 4.3 03/26/2020   ALBUMIN 2.5 (L) 03/03/2020   ALBUMIN 3.1 (L) 03/02/2020    No results found for: MG No results found for: VD25OH  No results found for: PREALBUMIN CBC EXTENDED  Latest Ref Rng & Units 03/08/2020 03/07/2020 03/06/2020  WBC 4.0 - 10.5 K/uL 6.8 7.4 8.6  RBC 4.22 - 5.81 MIL/uL 4.71 4.74 4.65  HGB 13.0 - 17.0 g/dL 12.9(L) 12.7(L) 12.3(L)  HCT 39 - 52 % 39.7 39.6 38.5(L)  PLT 150 - 400 K/uL 349 322 280  NEUTROABS 1.7 - 7.7 K/uL 3.9 4.8 5.6  LYMPHSABS 0.7 - 4.0 K/uL 1.9 1.8 2.1     Body mass index is 43.67 kg/m.  Orders:  No orders of the defined types were placed in this encounter.  No orders of the defined types were placed in this encounter.    Procedures: No procedures performed  Clinical Data: No additional findings.  ROS:  All other systems negative, except as noted in the HPI. Review of Systems  Objective: Vital Signs: Ht 6\' 1"  (1.854 m)   Wt (!) 331 lb (150.1 kg)   BMI 43.67 kg/m   Specialty Comments:  No specialty comments available.  PMFS History: Patient Active Problem List   Diagnosis Date Noted  . Hyperlipidemia due to type 2 diabetes mellitus (HCC) 03/27/2020  . Status post amputation of left great toe (HCC) 03/26/2020  . Influenza vaccination declined 03/26/2020  . 23-polyvalent pneumococcal polysaccharide vaccine declined 03/26/2020  . COVID-19  vaccine series completed 03/26/2020  . Moderate major depression, single episode (HCC) 03/26/2020  . Anaerobic bacteremia 03/07/2020  . Major depressive disorder, recurrent episode, moderate (HCC) 03/04/2020  . Abscess of great toe, left   . Osteomyelitis of left foot (HCC)   . Sepsis due to group B Streptococcus with acute renal failure (HCC)   . Cellulitis of left toe 03/03/2020  . Diabetic ketoacidosis (HCC) 03/03/2020  . DKA (diabetic ketoacidosis) (HCC) 03/03/2020  . Ulcer of right foot with fat layer exposed (HCC) 06/06/2019  . Non-pressure chronic ulcer of other part of left foot limited to breakdown of skin (HCC) 06/06/2019  . Uncontrolled type 2 diabetes mellitus with hyperglycemia (HCC) 05/02/2018  . 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  Venous insufficiency of both lower extremities 07/30/2017  . Obesity, Class III, BMI 40-49.9 (morbid obesity) (HCC) 07/30/2017  . Essential hypertension 09/03/2016   Past Medical History:  Diagnosis Date  . Diabetes mellitus without complication (HCC)   . Hypertension     Family History  Problem Relation Age of Onset  . Diabetes Mother   . Hyperlipidemia Father   . Hypertension Father     Past Surgical History:  Procedure Laterality Date  . AMPUTATION Left 03/06/2020   Procedure: LEFT GREAT TOE AMPUTATION;  Surgeon: [name], MD;  Location: [facility] OR;  Service: Orthopedics;  Laterality: Left;   Social History   Occupational History  . Occupation: Unemployed  Tobacco Use  . Smoking status: Never Smoker  . Smokeless tobacco: Never Used  Vaping Use  . Vaping Use: Never used  Substance and Sexual Activity  . Alcohol use: No    Alcohol/week: 0.0 standard drinks  . Drug use: No  . Sexual activity: Not on file

## 2020-04-04 ENCOUNTER — Other Ambulatory Visit: Payer: Self-pay

## 2020-04-04 ENCOUNTER — Ambulatory Visit: Payer: Self-pay | Attending: Internal Medicine | Admitting: Licensed Clinical Social Worker

## 2020-04-04 ENCOUNTER — Ambulatory Visit: Payer: Self-pay | Attending: Internal Medicine

## 2020-04-04 DIAGNOSIS — F321 Major depressive disorder, single episode, moderate: Secondary | ICD-10-CM

## 2020-04-04 NOTE — Progress Notes (Signed)
Integrated Behavioral Health Visit via Telemedicine (Telephone)  04/04/2020 [name] [id number]  Number of Integrated Behavioral Health visits: 1 Session Start time: 8:40 AM  Session End time: 8:55 AM Total time: 15 minutes  Referring Provider: Dr. Laural Benes Type of Service: Individual Patient location: Home [facility] Provider location: Office All persons participating in visit: LCSW and Patient   I connected with [name] by telephone and verified that I am speaking with the correct person using two identifiers.   Discussed confidentiality: Yes   Confirmed demographics & insurance:  Yes   I discussed that engaging in this virtual visit, they consent to the provision of behavioral healthcare and the services will be billed under their insurance.   Patient and/or legal guardian expressed understanding and consented to virtual visit: Yes   PRESENTING CONCERNS: Patient or family reports the following symptoms/concerns: Pt reports that he has been dealing with depression and anxiety "for years" Symptoms include difficulty obtaining quality sleep, racing mind, and irritability  Duration of problem: Ongoing; Severity of problem: moderate  STRENGTHS (Protective Factors/Coping Skills): Social and Emotional competence and Concrete supports in place (healthy food, safe environments, etc.)  ASSESSMENT: Patient currently experiencing difficulty managing symptoms of depression and anxiety.  Pt may benefit from therapy and continued medication management.     GOALS ADDRESSED: Patient will: 1.  Reduce symptoms of: anxiety and depression Pt agreed to continue compliance with medication management through PCP   Progress of Goals: Ongoing  INTERVENTIONS: Interventions utilized:  Solution-Focused Strategies and Psychoeducation and/or Health Education Standardized Assessments completed & reviewed: Not Needed   OUTCOME: Patient Response: Pt participated during session.  States that he is not interested in therapy or any supportive resources at this time. Pt agreed to continue with medication management through PCP.   PLAN: 1. Follow up with behavioral health clinician on : Contact LCSW with any additional behavioral health and/or resource needs 2. Behavioral recommendations: Continue with compliance with medication management through PCP 3. Referral(s): Integrated [employer] (In Clinic)  I discussed the assessment and treatment plan with the patient and/or parent/guardian. They were provided an opportunity to ask questions and all were answered. They agreed with the plan and demonstrated an understanding of the instructions.   They were advised to call back or seek an in-person evaluation as appropriate.  I discussed that the purpose of this visit is to provide behavioral health care while limiting exposure to the novel coronavirus.  Discussed there is a possibility of technology failure and discussed alternative modes of communication if that failure occurs.  Bridgett Larsson, LCSW 04/13/2020 5:47 AM

## 2020-04-08 ENCOUNTER — Ambulatory Visit (INDEPENDENT_AMBULATORY_CARE_PROVIDER_SITE_OTHER): Payer: Self-pay | Admitting: Infectious Disease

## 2020-04-08 ENCOUNTER — Other Ambulatory Visit: Payer: Self-pay

## 2020-04-08 ENCOUNTER — Encounter: Payer: Self-pay | Admitting: Infectious Disease

## 2020-04-08 VITALS — BP 123/80 | HR 96 | Temp 97.6°F | Wt 335.0 lb

## 2020-04-08 DIAGNOSIS — E1165 Type 2 diabetes mellitus with hyperglycemia: Secondary | ICD-10-CM

## 2020-04-08 DIAGNOSIS — I872 Venous insufficiency (chronic) (peripheral): Secondary | ICD-10-CM

## 2020-04-08 DIAGNOSIS — E11621 Type 2 diabetes mellitus with foot ulcer: Secondary | ICD-10-CM

## 2020-04-08 DIAGNOSIS — R6520 Severe sepsis without septic shock: Secondary | ICD-10-CM

## 2020-04-08 DIAGNOSIS — N179 Acute kidney failure, unspecified: Secondary | ICD-10-CM

## 2020-04-08 DIAGNOSIS — A401 Sepsis due to streptococcus, group B: Secondary | ICD-10-CM

## 2020-04-08 DIAGNOSIS — M86072 Acute hematogenous osteomyelitis, left ankle and foot: Secondary | ICD-10-CM

## 2020-04-08 DIAGNOSIS — L97529 Non-pressure chronic ulcer of other part of left foot with unspecified severity: Secondary | ICD-10-CM

## 2020-04-08 DIAGNOSIS — L97521 Non-pressure chronic ulcer of other part of left foot limited to breakdown of skin: Secondary | ICD-10-CM

## 2020-04-08 NOTE — Progress Notes (Signed)
Subjective:    Patient ID: [name], male    DOB: February 09, 1981, 39 y.o.   MRN: [id number]  HPI   39 y.o. male recently admitted for acute osteomyelitis of the left great toe with secondary bacteremia due to group b streptococcus.   Received Vanc + Cefepime --> PCN infusion. He underwent amputation of the left great toe with clean margins with Dr. Sharol Given. The right great toe also had chronic ulcerations that improved  quickly with IV antibiotics. The plan was to continue with IV at discharge via PICC line however we were not able to secure home heath for him secondary to uninsured status. He was given a dose of long acting Oritavancin prior to discharge and continued on flagyl.   He saw Janene Madeira who switched him over to oral augmentin  Has completed this.  Is been followed closely by Dr. Sharol Given and Barnie Mort.  Stitches have been removed from amputation site.  Inflammatory markers are downtrending.  He states he has some pain in his left foot but mainly when he bears weight and it resolves when he then stops and elevates his foot.  Past Medical History:  Diagnosis Date  . Diabetes mellitus without complication ([place])   . Hypertension     Past Surgical History:  Procedure Laterality Date  . AMPUTATION Left 03/06/2020   Procedure: LEFT GREAT TOE AMPUTATION;  Surgeon: Newt Minion, MD;  Location: [facility];  Service: Orthopedics;  Laterality: Left;    Family History  Problem Relation Age of Onset  . Diabetes Mother   . Hyperlipidemia Father   . Hypertension Father       Social History   Socioeconomic History  . Marital status: Single    Spouse name: Not on file  . Number of children: Not on file  . Years of education: Not on file  . Highest education level: Not on file  Occupational History  . Occupation: Unemployed  Tobacco Use  . Smoking status: Never Smoker  . Smokeless tobacco: Never Used  Vaping Use  . Vaping Use: Never used  Substance and Sexual  Activity  . Alcohol use: No    Alcohol/week: 0.0 standard drinks  . Drug use: No  . Sexual activity: Not on file  Other Topics Concern  . Not on file  Social History Narrative   Live with parents in [place].    Social Determinants of Health   Financial Resource Strain:   . Difficulty of Paying Living Expenses: Not on file  Food Insecurity:   . Worried About Charity fundraiser in the Last Year: Not on file  . Ran Out of Food in the Last Year: Not on file  Transportation Needs:   . Lack of Transportation (Medical): Not on file  . Lack of Transportation (Non-Medical): Not on file  Physical Activity:   . Days of Exercise per Week: Not on file  . Minutes of Exercise per Session: Not on file  Stress:   . Feeling of Stress : Not on file  Social Connections:   . Frequency of Communication with Friends and Family: Not on file  . Frequency of Social Gatherings with Friends and Family: Not on file  . Attends Religious Services: Not on file  . Active Member of Clubs or Organizations: Not on file  . Attends Archivist Meetings: Not on file  . Marital Status: Not on file    Allergies  Allergen Reactions  . Milk-Related Compounds Diarrhea  .  Iodine     Patient endorses unspecified reaction to iodine in past     Current Outpatient Medications:  .  blood glucose meter kit and supplies, Dispense based on patient and insurance preference. Use up to four times daily as directed. (FOR ICD-10 E10.9, E11.9)., Disp: 1 each, Rfl: 0 .  insulin aspart protamine - aspart (NOVOLOG 70/30 MIX) (70-30) 100 UNIT/ML FlexPen, Inject 0.35 mLs (35 Units total) into the skin 2 (two) times daily., Disp: 15 mL, Rfl: 4 .  metFORMIN (GLUCOPHAGE) 500 MG tablet, Take 1 tablet (500 mg total) by mouth daily with breakfast., Disp: 30 tablet, Rfl: 3 .  nystatin cream (MYCOSTATIN), Apply topically 2 (two) times daily., Disp: 30 g, Rfl: 0 .  pravastatin (PRAVACHOL) 20 MG tablet, Take 1 tablet (20 mg  total) by mouth daily., Disp: 30 tablet, Rfl: 3 .  sertraline (ZOLOFT) 25 MG tablet, Take 1 tablet (25 mg total) by mouth daily., Disp: 30 tablet, Rfl: 0 .  sertraline (ZOLOFT) 50 MG tablet, Take 1 tablet (50 mg total) by mouth daily., Disp: 30 tablet, Rfl: 3   Review of Systems  Constitutional: Negative for activity change, appetite change, chills, diaphoresis, fatigue, fever and unexpected weight change.  HENT: Negative for congestion, rhinorrhea, sinus pressure, sneezing, sore throat and trouble swallowing.   Eyes: Negative for photophobia and visual disturbance.  Respiratory: Negative for cough, chest tightness, shortness of breath, wheezing and stridor.   Cardiovascular: Negative for chest pain, palpitations and leg swelling.  Gastrointestinal: Negative for abdominal distention, abdominal pain, anal bleeding, blood in stool, constipation, diarrhea, nausea and vomiting.  Genitourinary: Negative for difficulty urinating, dysuria, flank pain and hematuria.  Musculoskeletal: Negative for arthralgias, back pain, gait problem, joint swelling and myalgias.  Skin: Positive for wound. Negative for color change, pallor and rash.  Neurological: Negative for dizziness, tremors, weakness and light-headedness.  Hematological: Negative for adenopathy. Does not bruise/bleed easily.  Psychiatric/Behavioral: Negative for agitation, behavioral problems, confusion, decreased concentration, dysphoric mood and sleep disturbance.       Objective:   Physical Exam Constitutional:      General: He is not in acute distress.    Appearance: Normal appearance. He is well-developed. He is not ill-appearing or diaphoretic.  HENT:     Head: Normocephalic and atraumatic.     Right Ear: Hearing and external ear normal.     Left Ear: Hearing and external ear normal.     Nose: No nasal deformity or rhinorrhea.  Eyes:     General: No scleral icterus.    Conjunctiva/sclera: Conjunctivae normal.     Right eye: Right  conjunctiva is not injected.     Left eye: Left conjunctiva is not injected.     Pupils: Pupils are equal, round, and reactive to light.  Neck:     Vascular: No JVD.  Cardiovascular:     Rate and Rhythm: Normal rate and regular rhythm.     Heart sounds: S1 normal and S2 normal.  Pulmonary:     Effort: Pulmonary effort is normal. No respiratory distress.  Abdominal:     General: There is no distension.     Palpations: Abdomen is soft.  Musculoskeletal:        General: Normal range of motion.     Right shoulder: Normal.     Left shoulder: Normal.     Cervical back: Normal range of motion and neck supple.     Right hip: Normal.     Left hip: Normal.  Right knee: Normal.     Left knee: Normal.  Lymphadenopathy:     Head:     Right side of head: No submandibular, preauricular or posterior auricular adenopathy.     Left side of head: No submandibular, preauricular or posterior auricular adenopathy.     Cervical: No cervical adenopathy.     Right cervical: No superficial or deep cervical adenopathy.    Left cervical: No superficial or deep cervical adenopathy.  Skin:    General: Skin is warm and dry.     Coloration: Skin is not pale.     Findings: No abrasion, bruising, ecchymosis, erythema, lesion or rash.     Nails: There is no clubbing.  Neurological:     General: No focal deficit present.     Mental Status: He is alert and oriented to person, place, and time.     Sensory: No sensory deficit.     Coordination: Coordination normal.     Gait: Gait normal.  Psychiatric:        Attention and Perception: He is attentive.        Mood and Affect: Mood normal.        Speech: Speech normal.        Behavior: Behavior normal. Behavior is cooperative.        Thought Content: Thought content normal.        Judgment: Judgment normal.     Left foot April 08 2120:    Right toe with ulcer April 08, 2020:            Assessment & Plan:   Left toe osteomyelitis with  bacteremia status post amputation: Status post oritavancin and metronidazole then Augmentin.  Inflammatory markers are reassuring and his clinical symptoms are reassuring he is following closely with Dr. Sharol Given  Right foot ulcer: MRI read in October as being possible early osteomyelitis though Dr. Sharol Given was apical of this.  Certainly need to have continued follow-up for this and appropriate offloading.  Diabetes mellitus: Needs to be followed closely by primary care.  COVID-19 prevention has had 2 MODERNA and a vaccines and should get a third 1.

## 2020-04-09 ENCOUNTER — Ambulatory Visit: Payer: Self-pay | Attending: Internal Medicine | Admitting: Pharmacist

## 2020-04-09 ENCOUNTER — Encounter: Payer: Self-pay | Admitting: Pharmacist

## 2020-04-09 ENCOUNTER — Other Ambulatory Visit: Payer: Self-pay

## 2020-04-09 DIAGNOSIS — E1165 Type 2 diabetes mellitus with hyperglycemia: Secondary | ICD-10-CM

## 2020-04-09 LAB — GLUCOSE, POCT (MANUAL RESULT ENTRY): POC Glucose: 273 mg/dl — AB (ref 70–99)

## 2020-04-09 MED ORDER — INSULIN ASPART PROT & ASPART (70-30 MIX) 100 UNIT/ML PEN: 40.0000 [IU] | PEN_INJECTOR | Freq: Two times a day (BID) | SUBCUTANEOUS | 4 refills | Status: DC

## 2020-04-09 MED ORDER — METFORMIN HCL 500 MG PO TABS: 500.0000 mg | ORAL_TABLET | Freq: Two times a day (BID) | ORAL | 2 refills | Status: DC

## 2020-04-09 NOTE — Progress Notes (Signed)
S:     No chief complaint on file.   Patient arrives well and in good spirits.  Presents for diabetes evaluation, education, and management. Patient was referred and last seen by Dr. Laural Benes 03/26/20.   HPI:  Patient was recently hospitalized in October with osteomyelitis of the left big toe followed by amputation. Hospital stay was complicated by sepsis and DKA.   At hospital follow-up with Dr. Laural Benes, patient's glycemic control was noted to be poor. Of note, patient has history of medication non-adherence. Insulin was increased to 35 BID of Novolog 70/30 and metformin was initiated at a low dose of 500 mg daily.   Today, patient reports appropriate adherence to medications. Denies missed doses or injections. Patient reports to be tolerating metformin well, denies any GI side effects.   Family/Social History: family history consistent with diabetes, hyperlipidemia and hypertension  Insurance coverage/medication affordability: Self pay  Medication adherence reported. Current diabetes medications include: metformin 500 mg daily, Novolog 70/30 35 units BID Current hypertension medications include: none Current hyperlipidemia medications include: pravastatin 20 mg   Patient denies hypoglycemic events.  Patient reported dietary habits: Eats 3 meals/day  Patient-reported exercise habits: none reported, limited due to recent toe amputation    Patient denies nocturia (nighttime urination).  Patient reports neuropathy (nerve pain). Patient denies visual changes. Patient reports self foot exams.     O:  Physical Exam   ROS  POCT BG today: 273 (1 hour post meal)  Lab Results  Component Value Date   HGBA1C 11.8 (H) 03/03/2020   There were no vitals filed for this visit.  Lipid Panel     Component Value Date/Time   CHOL 224 (H) 03/26/2020 1600   TRIG 282 (H) 03/26/2020 1600   HDL 33 (L) 03/26/2020 1600   CHOLHDL 6.8 (H) 03/26/2020 1600   LDLCALC 140 (H) 03/26/2020  1600    Home fasting blood sugars:  Date AM Lunch Dinner Bedtime  11/11 - 206 176 203  11/12 - 220 185 157  11/13 202 212 243 -  11/14 - 192 163 160  11/15 - 200 190 197  11/16 175 - - -    Clinical Atherosclerotic Cardiovascular Disease (ASCVD): No  The 10-year ASCVD risk score Denman George DC Jr., et al., 2013) is: 4.5%   Values used to calculate the score:     Age: 10 years     Sex: Male     Is Non-Hispanic African American: No     Diabetic: Yes     Tobacco smoker: No     Systolic Blood Pressure: 123 mmHg     Is BP treated: No     HDL Cholesterol: 33 mg/dL     Total Cholesterol: 224 mg/dL    A/P: Diabetes longstanding currently uncontrolled. Patient is able to verbalize appropriate hypoglycemia management plan. Medication adherence appears to be appropriate. Control is suboptimal due to history of noncompliance. Blood sugars have improved significantly since visit with Dr. Laural Benes but can continue to improve.  -Increased dose of Novolog 70/30 to 40 units BID.  -Increased dose of metformin to 500 mg BID with a meal.  -Extensively discussed pathophysiology of diabetes, recommended lifestyle interventions, dietary effects on blood sugar control -Counseled on s/sx of and management of hypoglycemia -Next A1C anticipated January 2022.  ASCVD risk - primary prevention in patient with diabetes. Last LDL is not controlled. ASCVD risk score is not >20%  - moderate intensity statin indicated. -Continue pravastatin 20 mg, for now.  Can consider increasing to moderate intensity dose at next visit   Written patient instructions provided.  Total time in face to face counseling 15 minutes.   Follow up Pharmacist for Clinic Visit in one month. Follow up visit on 05/30/2020 with Dr. Laural Benes.   [name], PharmD PGY-1 [facility] Resident   04/09/2020 2:06 PM

## 2020-04-11 ENCOUNTER — Encounter: Payer: Self-pay | Admitting: Physician Assistant

## 2020-04-11 ENCOUNTER — Ambulatory Visit (INDEPENDENT_AMBULATORY_CARE_PROVIDER_SITE_OTHER): Payer: Self-pay | Admitting: Physician Assistant

## 2020-04-11 ENCOUNTER — Inpatient Hospital Stay: Payer: Self-pay | Admitting: Infectious Diseases

## 2020-04-11 VITALS — Ht 73.0 in | Wt 335.0 lb

## 2020-04-11 DIAGNOSIS — L97511 Non-pressure chronic ulcer of other part of right foot limited to breakdown of skin: Secondary | ICD-10-CM

## 2020-04-11 NOTE — Progress Notes (Signed)
Office Visit Note   Patient: [name]           Date of Birth: 05/08/1981           MRN: [id number] Visit Date: 04/11/2020              Requested by: Marcine Matar, MD [address] [place],  [place] [address] PCP: Marcine Matar, MD  Chief Complaint  Patient presents with  . Right Foot - Follow-up    Foot ulcer  . Left Foot - Follow-up    Great toe amputation 03/06/2020      HPI: This is a pleasant 39 year old gentleman who is 5 weeks status post left great toe amputation.  With regards to this he is doing well.  He does not want a filler and is in regular shoewear.  He also follows up for a ulcer on the plantar surface of his great toe on the right.  He said he has noticed a little more redness but this is mostly when his foot is in a dependent position and when he elevates his foot this gets much better  Assessment & Plan: Visit Diagnoses: No diagnosis found.  Plan: Follow-up in 3 weeks sooner if any issues.  She continue daily Dial cleansing and cover the area with a Band-Aid  Follow-Up Instructions: No follow-ups on file.   Ortho Exam  Patient is alert, oriented, no adenopathy, well-dressed, normal affect, normal respiratory effort.  Focused examination of his right foot ulcer it measures about a centimeter round.  Surrounding callus was trimmed to a healthy surface.  It does not probe to bone.  No foul odor improved with elevation mild erythema but no ascending cellulitis  Imaging: No results found. No images are attached to the encounter.  Labs: Lab Results  Component Value Date   HGBA1C 11.8 (H) 03/03/2020   HGBA1C 11.4 (A) 05/02/2018   HGBA1C 11.3 (A) 01/25/2018   ESRSEDRATE 48 (H) 03/21/2020   ESRSEDRATE 81 (H) 03/05/2020   CRP 5.6 03/21/2020   CRP 9.0 (H) 03/05/2020   REPTSTATUS 03/09/2020 FINAL 03/04/2020   CULT  03/04/2020    NO GROWTH 5 DAYS Performed at [facility], [address], [place], [place] [address]    LABORGA  GROUP B STREP(S.AGALACTIAE)ISOLATED 03/02/2020     Lab Results  Component Value Date   ALBUMIN 4.3 03/26/2020   ALBUMIN 2.5 (L) 03/03/2020   ALBUMIN 3.1 (L) 03/02/2020    No results found for: MG No results found for: VD25OH  No results found for: PREALBUMIN CBC EXTENDED Latest Ref Rng & Units 03/08/2020 03/07/2020 03/06/2020  WBC 4.0 - 10.5 K/uL 6.8 7.4 8.6  RBC 4.22 - 5.81 MIL/uL 4.71 4.74 4.65  HGB 13.0 - 17.0 g/dL 12.9(L) 12.7(L) 12.3(L)  HCT 39 - 52 % 39.7 39.6 38.5(L)  PLT 150 - 400 K/uL 349 322 280  NEUTROABS 1.7 - 7.7 K/uL 3.9 4.8 5.6  LYMPHSABS 0.7 - 4.0 K/uL 1.9 1.8 2.1     Body mass index is 44.2 kg/m.  Orders:  No orders of the defined types were placed in this encounter.  No orders of the defined types were placed in this encounter.    Procedures: No procedures performed  Clinical Data: No additional findings.  ROS:  All other systems negative, except as noted in the HPI. Review of Systems  Objective: Vital Signs: Ht 6\' 1"  (1.854 m)   Wt (!) 335 lb (152 kg)   BMI  44.20 kg/m   Specialty Comments:  No specialty comments available.  PMFS History: Patient Active Problem List   Diagnosis Date Noted  . Hyperlipidemia due to type 2 diabetes mellitus (HCC) 03/27/2020  . Status post amputation of left great toe (HCC) 03/26/2020  . Influenza vaccination declined 03/26/2020  . 23-polyvalent pneumococcal polysaccharide vaccine declined 03/26/2020  . COVID-19 vaccine series completed 03/26/2020  . Moderate major depression, single episode (HCC) 03/26/2020  . Anaerobic bacteremia 03/07/2020  . Major depressive disorder, recurrent episode, moderate (HCC) 03/04/2020  . Abscess of great toe, left   . Osteomyelitis of left foot (HCC)   . Sepsis due to group B Streptococcus with acute renal failure (HCC)   . Cellulitis of left toe 03/03/2020  . Diabetic ketoacidosis (HCC) 03/03/2020  . DKA (diabetic ketoacidosis) (HCC) 03/03/2020  . Ulcer of right  foot with fat layer exposed (HCC) 06/06/2019  . Non-pressure chronic ulcer of other part of left foot limited to breakdown of skin (HCC) 06/06/2019  . Uncontrolled type 2 diabetes mellitus with hyperglycemia (HCC) 05/02/2018  . 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 Venous insufficiency of both lower extremities 07/30/2017  . Obesity, Class III, BMI 40-49.9 (morbid obesity) (HCC) 07/30/2017  . Essential hypertension 09/03/2016   Past Medical History:  Diagnosis Date  . Diabetes mellitus without complication (HCC)   . Hypertension     Family History  Problem Relation Age of Onset  . Diabetes Mother   . Hyperlipidemia Father   . Hypertension Father     Past Surgical History:  Procedure Laterality Date  . AMPUTATION Left 03/06/2020   Procedure: LEFT GREAT TOE AMPUTATION;  Surgeon: [name], MD;  Location: [facility] OR;  Service: Orthopedics;  Laterality: Left;   Social History   Occupational History  . Occupation: Unemployed  Tobacco Use  . Smoking status: Never Smoker  . Smokeless tobacco: Never Used  Vaping Use  . Vaping Use: Never used  Substance and Sexual Activity  . Alcohol use: No    Alcohol/week: 0.0 standard drinks  . Drug use: No  . Sexual activity: Not on file

## 2020-05-02 ENCOUNTER — Ambulatory Visit (INDEPENDENT_AMBULATORY_CARE_PROVIDER_SITE_OTHER): Payer: Self-pay | Admitting: Orthopedic Surgery

## 2020-05-02 DIAGNOSIS — E11621 Type 2 diabetes mellitus with foot ulcer: Secondary | ICD-10-CM

## 2020-05-02 DIAGNOSIS — L97529 Non-pressure chronic ulcer of other part of left foot with unspecified severity: Secondary | ICD-10-CM

## 2020-05-02 DIAGNOSIS — L97511 Non-pressure chronic ulcer of other part of right foot limited to breakdown of skin: Secondary | ICD-10-CM

## 2020-05-03 ENCOUNTER — Encounter: Payer: Self-pay | Admitting: Orthopedic Surgery

## 2020-05-03 NOTE — Progress Notes (Signed)
Office Visit Note   Patient: [name]           Date of Birth: [birth year]           MRN: [id number] Visit Date: 05/02/2020              Requested by: Marcine Matar, MD [address] [place],  [place] [address] PCP: Marcine Matar, MD  Chief Complaint  Patient presents with  . Right Foot - Pain      HPI: Patient is a 39 year old gentleman who presents in follow-up for a Wagner grade 1 ulcer right great toe.  Patient has had a felt relieving donut applied.  He is status post a left great toe amputation.  Assessment & Plan: Visit Diagnoses:  1. Right foot ulcer, limited to breakdown of skin (HCC)   2. 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)     Plan: A new felt donut was placed in his shoe to unload pressure from the right great toe.  Ulcer was debrided of skin and soft tissue with a healthy wound bed.  Follow-Up Instructions: Return in about 4 weeks (around 05/30/2020).   Ortho Exam  Patient is alert, oriented, no adenopathy, well-dressed, normal affect, normal respiratory effort. Examination patient has a good pulse he has a Wagner grade 1 ulcer beneath the plantar aspect of the right great toe.  After informed consent a 10 blade knife was used to debride the skin and soft tissue back to healthy viable granulation tissue this was touched with silver nitrate there was no exposed bone or tendon no abscess no cellulitis.  Predebridement the ulcer is 1 cm diameter after debridement the ulcer is 2 cm in diameter 5 mm deep.  Imaging: No results found. No images are attached to the encounter.  Labs: Lab Results  Component Value Date   HGBA1C 11.8 (H) 03/03/2020   HGBA1C 11.4 (A) 05/02/2018   HGBA1C 11.3 (A) 01/25/2018   ESRSEDRATE 48 (H) 03/21/2020   ESRSEDRATE 81 (H) 03/05/2020   CRP 5.6 03/21/2020   CRP 9.0 (H) 03/05/2020   REPTSTATUS 03/09/2020 FINAL 03/04/2020   CULT  03/04/2020    NO GROWTH 5 DAYS Performed at [facility], [address], [place], [place] [address]    LABORGA GROUP B STREP(S.AGALACTIAE)ISOLATED 03/02/2020     Lab Results  Component Value Date   ALBUMIN 4.3 03/26/2020   ALBUMIN 2.5 (L) 03/03/2020   ALBUMIN 3.1 (L) 03/02/2020    No results found for: MG No results found for: VD25OH  No results found for: PREALBUMIN CBC EXTENDED Latest Ref Rng & Units 03/08/2020 03/07/2020 03/06/2020  WBC 4.0 - 10.5 K/uL 6.8 7.4 8.6  RBC 4.22 - 5.81 MIL/uL 4.71 4.74 4.65  HGB 13.0 - 17.0 g/dL 12.9(L) 12.7(L) 12.3(L)  HCT 39.0 - 52.0 % 39.7 39.6 38.5(L)  PLT 150 - 400 K/uL 349 322 280  NEUTROABS 1.7 - 7.7 K/uL 3.9 4.8 5.6  LYMPHSABS 0.7 - 4.0 K/uL 1.9 1.8 2.1     There is no height or weight on file to calculate BMI.  Orders:  No orders of the defined types were placed in this encounter.  No orders of the defined types were placed in this encounter.    Procedures: No procedures performed  Clinical Data: No additional findings.  ROS:  All other systems negative, except as noted in the HPI. Review of Systems  Objective: Vital Signs: There were no vitals taken for this  visit.  Specialty Comments:  No specialty comments available.  PMFS History: Patient Active Problem List   Diagnosis Date Noted  . Hyperlipidemia due to type 2 diabetes mellitus (HCC) 03/27/2020  . Status post amputation of left great toe (HCC) 03/26/2020  . Influenza vaccination declined 03/26/2020  . 23-polyvalent pneumococcal polysaccharide vaccine declined 03/26/2020  . COVID-19 vaccine series completed 03/26/2020  . Moderate major depression, single episode (HCC) 03/26/2020  . Anaerobic bacteremia 03/07/2020  . Major depressive disorder, recurrent episode, moderate (HCC) 03/04/2020  . Abscess of great toe, left   . Osteomyelitis of left foot (HCC)   . Sepsis due to group B Streptococcus with acute renal failure (HCC)   . Cellulitis of left toe 03/03/2020  . Diabetic ketoacidosis (HCC) 03/03/2020  . DKA (diabetic  ketoacidosis) (HCC) 03/03/2020  . Ulcer of right foot with fat layer exposed (HCC) 06/06/2019  . Non-pressure chronic ulcer of other part of left foot limited to breakdown of skin (HCC) 06/06/2019  . Uncontrolled type 2 diabetes mellitus with hyperglycemia (HCC) 05/02/2018  . 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 Venous insufficiency of both lower extremities 07/30/2017  . Obesity, Class III, BMI 40-49.9 (morbid obesity) (HCC) 07/30/2017  . Essential hypertension 09/03/2016   Past Medical History:  Diagnosis Date  . Diabetes mellitus without complication (HCC)   . Hypertension     Family History  Problem Relation Age of Onset  . Diabetes Mother   . Hyperlipidemia Father   . Hypertension Father     Past Surgical History:  Procedure Laterality Date  . AMPUTATION Left 03/06/2020   Procedure: LEFT GREAT TOE AMPUTATION;  Surgeon: [name], MD;  Location: [facility] OR;  Service: Orthopedics;  Laterality: Left;   Social History   Occupational History  . Occupation: Unemployed  Tobacco Use  . Smoking status: Never Smoker  . Smokeless tobacco: Never Used  Vaping Use  . Vaping Use: Never used  Substance and Sexual Activity  . Alcohol use: No    Alcohol/week: 0.0 standard drinks  . Drug use: No  . Sexual activity: Not on file

## 2020-05-09 ENCOUNTER — Encounter: Payer: Self-pay | Admitting: Pharmacist

## 2020-05-09 ENCOUNTER — Ambulatory Visit: Payer: Self-pay | Attending: Internal Medicine | Admitting: Pharmacist

## 2020-05-09 ENCOUNTER — Other Ambulatory Visit: Payer: Self-pay

## 2020-05-09 DIAGNOSIS — E1165 Type 2 diabetes mellitus with hyperglycemia: Secondary | ICD-10-CM

## 2020-05-09 LAB — GLUCOSE, POCT (MANUAL RESULT ENTRY): POC Glucose: 183 mg/dl — AB (ref 70–99)

## 2020-05-09 MED ORDER — METFORMIN HCL 500 MG PO TABS: 1000.0000 mg | ORAL_TABLET | Freq: Two times a day (BID) | ORAL | 2 refills | Status: DC

## 2020-05-09 NOTE — Patient Instructions (Signed)
Thank you for coming to see me today. Please do the following:  1. Increase metformin to 2 tablets in the morning and 2 tablets in the evening.  2. Continue 40 units twice a day of Novolog 70/30. 3. Continue checking blood sugars at home.  4. Continue making the lifestyle changes we've discussed together during our visit. Diet and exercise play a significant role in improving your blood sugars.  5. Follow-up with you PCP next month.   Hypoglycemia or low blood sugar:   Low blood sugar can happen quickly and may become an emergency if not treated right away.   While this shouldn't happen often, it can be brought upon if you skip a meal or do not eat enough. Also, if your insulin or other diabetes medications are dosed too high, this can cause your blood sugar to go to low.   Warning signs of low blood sugar include: 1. Feeling shaky or dizzy 2. Feeling weak or tired  3. Excessive hunger 4. Feeling anxious or upset  5. Sweating even when you aren't exercising  What to do if I experience low blood sugar? 1. Check your blood sugar with your meter. If lower than 70, proceed to step 2.  2. Treat with 3-4 glucose tablets or 3 packets of regular sugar. If these aren't around, you can try hard candy. Yet another option would be to drink 4 ounces of fruit juice or 6 ounces of REGULAR soda.  3. Re-check your sugar in 15 minutes. If it is still below 70, do what you did in step 2 again. If has come back up, go ahead and eat a snack or small meal at this time.

## 2020-05-09 NOTE — Addendum Note (Signed)
Addended by: Lois Huxley, Jeannett Senior L on: 05/09/2020 09:02 AM   Modules accepted: Orders

## 2020-05-09 NOTE — Progress Notes (Signed)
    S:     No chief complaint on file.   Patient arrives well and in good spirits.  Presents for diabetes evaluation, education, and management. Patient was referred and last seen by Dr. Laural Benes 03/26/20. Pharmacy saw him on 11/16 and increased his novolog and metformin doses.   Today, patient reports appropriate adherence to medications. Denies missed doses or injections. Patient reports that he continues to tolerate metformin well; denies any GI side effects.   Family/Social History: family history consistent with diabetes, hyperlipidemia and hypertension  Insurance coverage/medication affordability: Self pay  Medication adherence reported. Current diabetes medications include: metformin 500 mg BID, Novolog 70/30 40 units BID Current hypertension medications include: none Current hyperlipidemia medications include: pravastatin 20 mg   Patient denies hypoglycemic events.  Patient reported dietary habits: Eats 3 meals/day  Patient-reported exercise habits: none reported, limited due to recent toe amputation    Patient denies nocturia (nighttime urination).  Patient reports neuropathy (nerve pain). Patient denies visual changes. Patient reports self foot exams.     O:  Physical Exam   ROS  POCT BG today: 183 (fasting - forgot to take his PM Novolog dose last night)  Lab Results  Component Value Date   HGBA1C 11.8 (H) 03/03/2020   There were no vitals filed for this visit.  Lipid Panel     Component Value Date/Time   CHOL 224 (H) 03/26/2020 1600   TRIG 282 (H) 03/26/2020 1600   HDL 33 (L) 03/26/2020 1600   CHOLHDL 6.8 (H) 03/26/2020 1600   LDLCALC 140 (H) 03/26/2020 1600    Home fasting blood sugars:  No meter today Endorses 128 - mid 150s. Same reported post prandial.   Clinical Atherosclerotic Cardiovascular Disease (ASCVD): No  The 10-year ASCVD risk score Denman George DC Jr., et al., 2013) is: 5%   Values used to calculate the score:     Age: 39 years      Sex: Male     Is Non-Hispanic African American: No     Diabetic: Yes     Tobacco smoker: No     Systolic Blood Pressure: 123 mmHg     Is BP treated: No     HDL Cholesterol: 33 mg/dL     Total Cholesterol: 224 mg/dL    A/P: Diabetes longstanding currently uncontrolled, however, home sugars reveal improvement. Patient is able to verbalize appropriate hypoglycemia management plan. Medication adherence appears to be appropriate.  -Continue Novolog 70/30 40 units BID -Increased dose of metformin to 1000 mg BID with a meal.  -Extensively discussed pathophysiology of diabetes, recommended lifestyle interventions, dietary effects on blood sugar control -Counseled on s/sx of and management of hypoglycemia -Next A1C anticipated January 2022.  ASCVD risk - primary prevention in patient with diabetes. Last LDL is not controlled. ASCVD risk score is not >20%  - moderate intensity statin indicated. -Continue pravastatin 20 mg, for now. Can consider increasing to moderate intensity dose at next visit   Written patient instructions provided.  Total time in face to face counseling 15 minutes.   Follow up PCP for Clinic Visit 05/30/2020.   Butch Penny, PharmD, P[place]y [place], CPP Clinical Pharmacist [facility] [phone]

## 2020-05-30 ENCOUNTER — Ambulatory Visit: Payer: Self-pay | Attending: Internal Medicine | Admitting: Internal Medicine

## 2020-05-30 ENCOUNTER — Ambulatory Visit (INDEPENDENT_AMBULATORY_CARE_PROVIDER_SITE_OTHER): Payer: Self-pay | Admitting: Orthopedic Surgery

## 2020-05-30 ENCOUNTER — Other Ambulatory Visit: Payer: Self-pay

## 2020-05-30 ENCOUNTER — Other Ambulatory Visit: Payer: Self-pay | Admitting: Internal Medicine

## 2020-05-30 DIAGNOSIS — F324 Major depressive disorder, single episode, in partial remission: Secondary | ICD-10-CM

## 2020-05-30 DIAGNOSIS — Z89412 Acquired absence of left great toe: Secondary | ICD-10-CM

## 2020-05-30 DIAGNOSIS — L97512 Non-pressure chronic ulcer of other part of right foot with fat layer exposed: Secondary | ICD-10-CM

## 2020-05-30 DIAGNOSIS — E1169 Type 2 diabetes mellitus with other specified complication: Secondary | ICD-10-CM

## 2020-05-30 DIAGNOSIS — E785 Hyperlipidemia, unspecified: Secondary | ICD-10-CM

## 2020-05-30 DIAGNOSIS — L97511 Non-pressure chronic ulcer of other part of right foot limited to breakdown of skin: Secondary | ICD-10-CM

## 2020-05-30 DIAGNOSIS — M6701 Short Achilles tendon (acquired), right ankle: Secondary | ICD-10-CM

## 2020-05-30 MED ORDER — PEN NEEDLES 31G X 8 MM MISC: 6 refills | Status: DC

## 2020-05-30 MED ORDER — TRUE METRIX BLOOD GLUCOSE TEST VI STRP: ORAL_STRIP | 12 refills | Status: DC

## 2020-05-30 NOTE — Progress Notes (Signed)
Pt states his blood sugar average from 120-140

## 2020-05-30 NOTE — Progress Notes (Signed)
Virtual Visit via Telephone Note  I connected with [name] on 05/30/20 at 4:39 p.m by telephone and verified that I am speaking with the correct person using two identifiers.  Location: Patient: home Provider: office  Only the patient, my CMA and myself participated in this encounter. I discussed the limitations, risks, security and privacy concerns of performing an evaluation and management service by telephone and the availability of in person appointments. I also discussed with the patient that there may be a patient responsible charge related to this service. The patient expressed understanding and agreed to proceed.   History of Present Illness: Pt with hx of DM type 2, obesity, DM foot ulcers, amputation LT big toe (02/2020), MDD, obesity, HTN.  Last seen by me 2 mths ago  DM: On last visit with me we increased his NovoLog insulin to 35 units twice a day and added Metformin.  Saw the clinical pharmicist since last visit.  His insulin has been increased to 40 units twice a day and Metformin increased to 1 g twice a day. Tolerating Metformin and compliant with it and insulin -checking BS 2-3 x/day before meals.  Range 120-140 Doing better with eating habits.  "Staying away from sodas and sugars." -Not doing much activity  RT big toe ulcer - reports this is healing and LT foot healed good. Reports that he saw Dr. Sharol Given today.  F/u again in 2 wks   On last visit we had checked lipid profile.  LDL was not at goal.  I recommended adding Pravachol.  He has been taking the medication and tolerating it well.  Depression:  On last visit, we increased Zoloft to 50 mg daily.  Doing pretty good "I feel kinda normal.  It's been control."   Outpatient Encounter Medications as of 05/30/2020  Medication Sig  . blood glucose meter kit and supplies Dispense based on patient and insurance preference. Use up to four times daily as directed. (FOR ICD-10 E10.9, E11.9).  . insulin aspart  protamine - aspart (NOVOLOG 70/30 MIX) (70-30) 100 UNIT/ML FlexPen Inject 0.4 mLs (40 Units total) into the skin 2 (two) times daily.  . metFORMIN (GLUCOPHAGE) 500 MG tablet Take 2 tablets (1,000 mg total) by mouth 2 (two) times daily with a meal.  . nystatin cream (MYCOSTATIN) Apply topically 2 (two) times daily.  . pravastatin (PRAVACHOL) 20 MG tablet Take 1 tablet (20 mg total) by mouth daily.  . sertraline (ZOLOFT) 50 MG tablet Take 1 tablet (50 mg total) by mouth daily.   No facility-administered encounter medications on file as of 05/30/2020.      Observations/Objective: Depression screen [facility] 2/9 03/26/2020 03/21/2020 04/04/2019  Decreased Interest 3 0 2  Down, Depressed, Hopeless 2 0 3  PHQ - 2 Score 5 0 5  Altered sleeping 3 - 3  Tired, decreased energy 3 - 3  Change in appetite 1 - 1  Feeling bad or failure about yourself  1 - 3  Trouble concentrating 1 - 2  Moving slowly or fidgety/restless 0 - 2  Suicidal thoughts 0 - 1  PHQ-9 Score 14 - 20  Difficult doing work/chores - - -  Some recent data might be hidden   Lab Results  Component Value Date   CHOL 224 (H) 03/26/2020   HDL 33 (L) 03/26/2020   LDLCALC 140 (H) 03/26/2020   TRIG 282 (H) 03/26/2020   CHOLHDL 6.8 (H) 03/26/2020     Chemistry      Component Value Date/Time  NA 139 03/26/2020 1600   K 4.6 03/26/2020 1600   CL 98 03/26/2020 1600   CO2 26 03/26/2020 1600   BUN 12 03/26/2020 1600   CREATININE 0.85 03/26/2020 1600      Component Value Date/Time   CALCIUM 9.8 03/26/2020 1600   ALKPHOS 90 03/26/2020 1600   AST 15 03/26/2020 1600   ALT 21 03/26/2020 1600   BILITOT 0.2 03/26/2020 1600        Assessment and Plan: 1. Type 2 diabetes mellitus with morbid obesity (HCC) Blood sugars are much better.  He will continue current dose of insulin and Metformin.  Encourage and discuss healthy eating habits.  Once his ulcer on the right big toe is completely healed, I recommend that he starts trying to move  more like walking 3 times a week for 15 minutes with gradual increase. - glucose blood (TRUE METRIX BLOOD GLUCOSE TEST) test strip; Use as instructed  Dispense: 100 each; Refill: 12 - Insulin Pen Needle (PEN NEEDLES) 31G X 8 MM MISC; UAD  Dispense: 100 each; Refill: 6  2. Major depressive disorder with single episode, in partial remission [facility]) Doing much better.  He feels he is on adequate dose of Zoloft.  We will have him continue the Zoloft for at least 6 months before even thinking about stopping it.  3. Hyperlipidemia due to type 2 diabetes mellitus (HCC) Continue Pravachol  4. Ulcer of right foot with fat layer exposed ([place]) 5. Status post amputation of left great toe ([place]) Followed by orthopedics.  Healing well.  He has been released from the care of infectious disease.   Follow Up Instructions: 4 mths   I discussed the assessment and treatment plan with the patient. The patient was provided an opportunity to ask questions and all were answered. The patient agreed with the plan and demonstrated an understanding of the instructions.   The patient was advised to call back or seek an in-person evaluation if the symptoms worsen or if the condition fails to improve as anticipated.  I provided 11 minutes of non-face-to-face time during this encounter.   Karle Plumber, MD

## 2020-05-31 MED FILL — TRUEPLUS PEN NDL 31GX5/16: 31G X 8 MM | 25 days supply | Qty: 100 | Fill #0

## 2020-05-31 MED FILL — TRUE METRIX GLUCOSE TEST ST: 25 days supply | Qty: 100 | Fill #0

## 2020-06-05 ENCOUNTER — Ambulatory Visit (INDEPENDENT_AMBULATORY_CARE_PROVIDER_SITE_OTHER): Payer: No Payment, Other | Admitting: Licensed Clinical Social Worker

## 2020-06-05 ENCOUNTER — Encounter: Payer: Self-pay | Admitting: Orthopedic Surgery

## 2020-06-05 ENCOUNTER — Other Ambulatory Visit: Payer: Self-pay

## 2020-06-05 DIAGNOSIS — F418 Other specified anxiety disorders: Secondary | ICD-10-CM

## 2020-06-05 NOTE — Progress Notes (Signed)
Office Visit Note   Patient: [name]           Date of Birth: 1981/02/18           MRN: [id number] Visit Date: 05/30/2020              Requested by: Marcine Matar, MD [address] [place],  [place] [address] PCP: Marcine Matar, MD  Chief Complaint  Patient presents with  . Right Foot - Follow-up, Wound Check  . Left Foot - Follow-up, Wound Check      HPI: Patient is a 40 year old gentleman who presents with a history of ulceration beneath the IP joint of the great toe bilaterally.  Patient states the left great toe ulcer is completely healed.  Assessment & Plan: Visit Diagnoses:  1. Right foot ulcer, limited to breakdown of skin (HCC)   2. Achilles tendon contracture, right     Plan: The ulcer on the right great toe was debrided of skin and soft tissue patient was given instructions for Achilles stretching he does have a felt relieving donut.  Follow-Up Instructions: Return in about 2 weeks (around 06/13/2020).   Ortho Exam  Patient is alert, oriented, no adenopathy, well-dressed, normal affect, normal respiratory effort. Examination patient has Achilles contracture on the right with dorsiflexion only to about neutral he also has hallux rigidus with dorsiflexion only about 10 degrees of the great toe.  There is an ulcer on the plantar aspect of the IP joint of the great toe.  This measures 10 mm in diameter.  After informed consent a 10 blade knife was used to debride the skin and soft tissue back to healthy viable granulation tissue there is no exposed bone or tendon silver nitrate was used for hemostasis after debridement the ulcer is 2 cm in diameter and 3 mm deep  Imaging: No results found. No images are attached to the encounter.  Labs: Lab Results  Component Value Date   HGBA1C 11.8 (H) 03/03/2020   HGBA1C 11.4 (A) 05/02/2018   HGBA1C 11.3 (A) 01/25/2018   ESRSEDRATE 48 (H) 03/21/2020   ESRSEDRATE 81 (H) 03/05/2020   CRP 5.6 03/21/2020    CRP 9.0 (H) 03/05/2020   REPTSTATUS 03/09/2020 FINAL 03/04/2020   CULT  03/04/2020    NO GROWTH 5 DAYS Performed at [facility], [address], [place], [place] [address]    LABORGA GROUP B STREP(S.AGALACTIAE)ISOLATED 03/02/2020     Lab Results  Component Value Date   ALBUMIN 4.3 03/26/2020   ALBUMIN 2.5 (L) 03/03/2020   ALBUMIN 3.1 (L) 03/02/2020    No results found for: MG No results found for: VD25OH  No results found for: PREALBUMIN CBC EXTENDED Latest Ref Rng & Units 03/08/2020 03/07/2020 03/06/2020  WBC 4.0 - 10.5 K/uL 6.8 7.4 8.6  RBC 4.22 - 5.81 MIL/uL 4.71 4.74 4.65  HGB 13.0 - 17.0 g/dL 12.9(L) 12.7(L) 12.3(L)  HCT 39.0 - 52.0 % 39.7 39.6 38.5(L)  PLT 150 - 400 K/uL 349 322 280  NEUTROABS 1.7 - 7.7 K/uL 3.9 4.8 5.6  LYMPHSABS 0.7 - 4.0 K/uL 1.9 1.8 2.1     There is no height or weight on file to calculate BMI.  Orders:  No orders of the defined types were placed in this encounter.  No orders of the defined types were placed in this encounter.    Procedures: No procedures performed  Clinical Data: No additional findings.  ROS:  All other systems negative, except  as noted in the HPI. Review of Systems  Objective: Vital Signs: There were no vitals taken for this visit.  Specialty Comments:  No specialty comments available.  PMFS History: Patient Active Problem List   Diagnosis Date Noted  . Hyperlipidemia due to type 2 diabetes mellitus (HCC) 03/27/2020  . Status post amputation of left great toe (HCC) 03/26/2020  . Influenza vaccination declined 03/26/2020  . 23-polyvalent pneumococcal polysaccharide vaccine declined 03/26/2020  . COVID-19 vaccine series completed 03/26/2020  . Moderate major depression, single episode (HCC) 03/26/2020  . Anaerobic bacteremia 03/07/2020  . Major depressive disorder, recurrent episode, moderate (HCC) 03/04/2020  . Abscess of great toe, left   . Osteomyelitis of left foot (HCC)   . Sepsis due to  group B Streptococcus with acute renal failure (HCC)   . Cellulitis of left toe 03/03/2020  . Diabetic ketoacidosis (HCC) 03/03/2020  . DKA (diabetic ketoacidosis) (HCC) 03/03/2020  . Ulcer of right foot with fat layer exposed (HCC) 06/06/2019  . Non-pressure chronic ulcer of other part of left foot limited to breakdown of skin (HCC) 06/06/2019  . Uncontrolled type 2 diabetes mellitus with hyperglycemia (HCC) 05/02/2018  . 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 Venous insufficiency of both lower extremities 07/30/2017  . Obesity, Class III, BMI 40-49.9 (morbid obesity) (HCC) 07/30/2017  . Essential hypertension 09/03/2016   Past Medical History:  Diagnosis Date  . Diabetes mellitus without complication (HCC)   . Hypertension     Family History  Problem Relation Age of Onset  . Diabetes Mother   . Hyperlipidemia Father   . Hypertension Father     Past Surgical History:  Procedure Laterality Date  . AMPUTATION Left 03/06/2020   Procedure: LEFT GREAT TOE AMPUTATION;  Surgeon: [name], MD;  Location: [facility] OR;  Service: Orthopedics;  Laterality: Left;   Social History   Occupational History  . Occupation: Unemployed  Tobacco Use  . Smoking status: Never Smoker  . Smokeless tobacco: Never Used  Vaping Use  . Vaping Use: Never used  Substance and Sexual Activity  . Alcohol use: No    Alcohol/week: 0.0 standard drinks  . Drug use: No  . Sexual activity: Not on file

## 2020-06-07 DIAGNOSIS — F418 Other specified anxiety disorders: Secondary | ICD-10-CM | POA: Insufficient documentation
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
## 2020-06-12 ENCOUNTER — Telehealth (HOSPITAL_COMMUNITY): Payer: No Payment, Other | Admitting: Psychiatry

## 2020-06-12 ENCOUNTER — Other Ambulatory Visit: Payer: Self-pay

## 2020-06-13 ENCOUNTER — Ambulatory Visit (INDEPENDENT_AMBULATORY_CARE_PROVIDER_SITE_OTHER): Payer: Self-pay | Admitting: Physician Assistant

## 2020-06-13 ENCOUNTER — Encounter: Payer: Self-pay | Admitting: Orthopedic Surgery

## 2020-06-13 DIAGNOSIS — M6702 Short Achilles tendon (acquired), left ankle: Secondary | ICD-10-CM

## 2020-06-13 NOTE — Progress Notes (Signed)
Office Visit Note   Patient: [name]           Date of Birth: 05-12-1981           MRN: [id number] Visit Date: 06/13/2020              Requested by: Marcine Matar, MD [address] [place],  [place] [address] PCP: Marcine Matar, MD  Chief Complaint  Patient presents with  . Right Foot - Follow-up    GT ulcer       HPI: Patient presents today in follow-up for his right great toe ulcer.  At his last visit this was debrided and he was told to do Achilles stretching.  He admits he has not been doing much Achilles stretching he has however been wearing his felt relief.  Assessment & Plan: Visit Diagnoses: No diagnosis found.  Plan: Follow-up 2 weeks for reevaluation ray debridement once again emphasized the importance of Achilles stretching  Follow-Up Instructions: No follow-ups on file.   Ortho Exam  Patient is alert, oriented, no adenopathy, well-dressed, normal affect, normal respiratory effort. Focused examination demonstrates great toe no cellulitis no erythema he does have a 3 x 4 cm great toe ulcer with maceration and callus around.  It does not probe deeply and there is no exposed tendon or bone after verbal consent this was debrided to healthy viable tissue hemostasis was achieved with a silver nitrate stick no signs of acute infection  Imaging: No results found. No images are attached to the encounter.  Labs: Lab Results  Component Value Date   HGBA1C 11.8 (H) 03/03/2020   HGBA1C 11.4 (A) 05/02/2018   HGBA1C 11.3 (A) 01/25/2018   ESRSEDRATE 48 (H) 03/21/2020   ESRSEDRATE 81 (H) 03/05/2020   CRP 5.6 03/21/2020   CRP 9.0 (H) 03/05/2020   REPTSTATUS 03/09/2020 FINAL 03/04/2020   CULT  03/04/2020    NO GROWTH 5 DAYS Performed at [facility], [address], [place], [place] [address]    LABORGA GROUP B STREP(S.AGALACTIAE)ISOLATED 03/02/2020     Lab Results  Component Value Date   ALBUMIN 4.3 03/26/2020   ALBUMIN 2.5 (L)  03/03/2020   ALBUMIN 3.1 (L) 03/02/2020    No results found for: MG No results found for: VD25OH  No results found for: PREALBUMIN CBC EXTENDED Latest Ref Rng & Units 03/08/2020 03/07/2020 03/06/2020  WBC 4.0 - 10.5 K/uL 6.8 7.4 8.6  RBC 4.22 - 5.81 MIL/uL 4.71 4.74 4.65  HGB 13.0 - 17.0 g/dL 12.9(L) 12.7(L) 12.3(L)  HCT 39.0 - 52.0 % 39.7 39.6 38.5(L)  PLT 150 - 400 K/uL 349 322 280  NEUTROABS 1.7 - 7.7 K/uL 3.9 4.8 5.6  LYMPHSABS 0.7 - 4.0 K/uL 1.9 1.8 2.1     There is no height or weight on file to calculate BMI.  Orders:  No orders of the defined types were placed in this encounter.  No orders of the defined types were placed in this encounter.    Procedures: No procedures performed  Clinical Data: No additional findings.  ROS:  All other systems negative, except as noted in the HPI. Review of Systems  Objective: Vital Signs: There were no vitals taken for this visit.  Specialty Comments:  No specialty comments available.  PMFS History: Patient Active Problem List   Diagnosis Date Noted  . Anxiety with depression 06/07/2020  . Hyperlipidemia due to type 2 diabetes mellitus (HCC) 03/27/2020  . Status post amputation of left  great toe (HCC) 03/26/2020  . Influenza vaccination declined 03/26/2020  . 23-polyvalent pneumococcal polysaccharide vaccine declined 03/26/2020  . COVID-19 vaccine series completed 03/26/2020  . Moderate major depression, single episode (HCC) 03/26/2020  . Anaerobic bacteremia 03/07/2020  . Major depressive disorder, recurrent episode, moderate (HCC) 03/04/2020  . Abscess of great toe, left   . Osteomyelitis of left foot (HCC)   . Sepsis due to group B Streptococcus with acute renal failure (HCC)   . Cellulitis of left toe 03/03/2020  . Diabetic ketoacidosis (HCC) 03/03/2020  . DKA (diabetic ketoacidosis) (HCC) 03/03/2020  . Ulcer of right foot with fat layer exposed (HCC) 06/06/2019  . Non-pressure chronic ulcer of other part of  left foot limited to breakdown of skin (HCC) 06/06/2019  . Uncontrolled type 2 diabetes mellitus with hyperglycemia (HCC) 05/02/2018  . 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 Venous insufficiency of both lower extremities 07/30/2017  . Obesity, Class III, BMI 40-49.9 (morbid obesity) (HCC) 07/30/2017  . Essential hypertension 09/03/2016   Past Medical History:  Diagnosis Date  . Diabetes mellitus without complication (HCC)   . Hypertension     Family History  Problem Relation Age of Onset  . Diabetes Mother   . Hyperlipidemia Father   . Hypertension Father     Past Surgical History:  Procedure Laterality Date  . AMPUTATION Left 03/06/2020   Procedure: LEFT GREAT TOE AMPUTATION;  Surgeon: [name], MD;  Location: [facility] OR;  Service: Orthopedics;  Laterality: Left;   Social History   Occupational History  . Occupation: Unemployed  Tobacco Use  . Smoking status: Never Smoker  . Smokeless tobacco: Never Used  Vaping Use  . Vaping Use: Never used  Substance and Sexual Activity  . Alcohol use: No    Alcohol/week: 0.0 standard drinks  . Drug use: No  . Sexual activity: Not on file

## 2020-06-24 ENCOUNTER — Other Ambulatory Visit: Payer: Self-pay

## 2020-06-24 ENCOUNTER — Ambulatory Visit (INDEPENDENT_AMBULATORY_CARE_PROVIDER_SITE_OTHER): Payer: No Payment, Other | Admitting: Licensed Clinical Social Worker

## 2020-06-24 DIAGNOSIS — F418 Other specified anxiety disorders: Secondary | ICD-10-CM

## 2020-06-25 NOTE — Progress Notes (Signed)
   THERAPIST PROGRESS NOTE  Session Time: 25 min  Participation Level: Active  Behavioral Response: Casual and Fairly GroomedLethargicAnxious and Depressed  Type of Therapy: Individual Therapy  Treatment Goals addressed: Communication: Dep/Anx/Coping  Interventions: Motivational Interviewing and Supportive  Summary: [name] is a 40 y.o. male who presents with hx of dep/anx and self reported ADHD. Pt comes late for in person session. He states he was sleeping and did not wake to alarm set. Pt reports no significant changes since initial session on 06/05/20. Pt continues to take meds as prescribed and validates they are helping keep his dep/anx better managed. Pt says he still has "squirrel brain". He continues to isolate and either reads or is online or sleeping q day. He states when he has a good book he can sit and read for an extended period. He is noted to have his L leg bouncing during session. This date pt reports he has wound on his R big toe that has been there 1.5 yrs. He states same happened with L toe and that toe ended up being amputated after pt went into hospital with sepsis. Pt states since that time he has paid more attention to his DM. Further assessment reveals he is paying more attention to meds, diet. He states his sugars now run ~ 130 when they used to be near 400. LCSW provided additional education on diabetes management and the importance of same. Pt confirms he has no routine exercise program. He agrees to ask Dr. Lajoyce Corners if he is cleared to start a walking program on appt next wk for wound management. LCSW assessed for other pt goals. Pt states he needs help with his ADHD. He is agreeable for med management eval which was facilitated. LCSW introduced the importance of self talk and provided literature for pt to take home to read. He agrees to read this and explore topic in more detail next session. LCSW reviewed poc including scheduling prior to close of session. Pt  states appreciation for care.     Suicidal/Homicidal: Nowithout intent/plan  Therapist Response: Pt remains receptive to care.  Plan: Return again in ~3 weeks.  Diagnosis: Axis I: Depression with Anxiety    Axis II: Deferred  [place] Sink, LCSW 06/25/2020

## 2020-07-04 ENCOUNTER — Ambulatory Visit (INDEPENDENT_AMBULATORY_CARE_PROVIDER_SITE_OTHER): Payer: Self-pay | Admitting: Orthopedic Surgery

## 2020-07-04 ENCOUNTER — Encounter: Payer: Self-pay | Admitting: Physician Assistant

## 2020-07-04 VITALS — Ht 73.0 in | Wt 330.0 lb

## 2020-07-04 DIAGNOSIS — M6701 Short Achilles tendon (acquired), right ankle: Secondary | ICD-10-CM

## 2020-07-04 DIAGNOSIS — L97511 Non-pressure chronic ulcer of other part of right foot limited to breakdown of skin: Secondary | ICD-10-CM

## 2020-07-12 ENCOUNTER — Encounter: Payer: Self-pay | Admitting: Physician Assistant

## 2020-07-12 NOTE — Progress Notes (Signed)
Office Visit Note   Patient: [name]           Date of Birth: May 12, 1981           MRN: [id number] Visit Date: 07/04/2020              Requested by: Marcine Matar, MD [address] [place],  [place] [address] PCP: Marcine Matar, MD  Chief Complaint  Patient presents with  . Right Foot - Follow-up      HPI: Patient is a 40 year old gentleman who presents for evaluation for Achilles contracture of the right great toe ulcer.  Assessment & Plan: Visit Diagnoses:  1. Right foot ulcer, limited to breakdown of skin (HCC)   2. Achilles tendon contracture, right     Plan: Discussed treatment options to continue with Achilles stretching versus surgical intervention with a gastrocnemius recession. Patient states he would like to continue with stretching.  Follow-Up Instructions: Return in about 3 weeks (around 07/25/2020).   Ortho Exam  Patient is alert, oriented, no adenopathy, well-dressed, normal affect, normal respiratory effort. Examination patient has a Wagner grade 1 ulcer beneath the right great toe 10 mm in diameter 5 mm deep with 100% healthy granulation tissue there is no drainage no exposed bone or tendon. Patient does have hallux rigidus with dorsiflexion of the great toe of only 10 degrees. With the knee extended patient has ankle dorsiflexion 20 degrees short of neutral. He has dorsiflexion 20 degrees past neutral with his knee flexed.  Imaging: No results found. No images are attached to the encounter.  Labs: Lab Results  Component Value Date   HGBA1C 11.8 (H) 03/03/2020   HGBA1C 11.4 (A) 05/02/2018   HGBA1C 11.3 (A) 01/25/2018   ESRSEDRATE 48 (H) 03/21/2020   ESRSEDRATE 81 (H) 03/05/2020   CRP 5.6 03/21/2020   CRP 9.0 (H) 03/05/2020   REPTSTATUS 03/09/2020 FINAL 03/04/2020   CULT  03/04/2020    NO GROWTH 5 DAYS Performed at [facility], [address] [place], [place] [address]    LABORGA GROUP B STREP(S.AGALACTIAE)ISOLATED  03/02/2020     Lab Results  Component Value Date   ALBUMIN 4.3 03/26/2020   ALBUMIN 2.5 (L) 03/03/2020   ALBUMIN 3.1 (L) 03/02/2020    No results found for: MG No results found for: VD25OH  No results found for: PREALBUMIN CBC EXTENDED Latest Ref Rng & Units 03/08/2020 03/07/2020 03/06/2020  WBC 4.0 - 10.5 K/uL 6.8 7.4 8.6  RBC 4.22 - 5.81 MIL/uL 4.71 4.74 4.65  HGB 13.0 - 17.0 g/dL 12.9(L) 12.7(L) 12.3(L)  HCT 39.0 - 52.0 % 39.7 39.6 38.5(L)  PLT 150 - 400 K/uL 349 322 280  NEUTROABS 1.7 - 7.7 K/uL 3.9 4.8 5.6  LYMPHSABS 0.7 - 4.0 K/uL 1.9 1.8 2.1     Body mass index is 43.54 kg/m.  Orders:  No orders of the defined types were placed in this encounter.  No orders of the defined types were placed in this encounter.    Procedures: No procedures performed  Clinical Data: No additional findings.  ROS:  All other systems negative, except as noted in the HPI. Review of Systems  Objective: Vital Signs: Ht 6\' 1"  (1.854 m)   Wt (!) 330 lb (149.7 kg)   BMI 43.54 kg/m   Specialty Comments:  No specialty comments available.  PMFS History: Patient Active Problem List   Diagnosis Date Noted  . Anxiety with depression 06/07/2020  . Hyperlipidemia due to  type 2 diabetes mellitus (HCC) 03/27/2020  . Status post amputation of left great toe (HCC) 03/26/2020  . Influenza vaccination declined 03/26/2020  . 23-polyvalent pneumococcal polysaccharide vaccine declined 03/26/2020  . COVID-19 vaccine series completed 03/26/2020  . Moderate major depression, single episode (HCC) 03/26/2020  . Anaerobic bacteremia 03/07/2020  . Major depressive disorder, recurrent episode, moderate (HCC) 03/04/2020  . Abscess of great toe, left   . Osteomyelitis of left foot (HCC)   . Sepsis due to group B Streptococcus with acute renal failure (HCC)   . Cellulitis of left toe 03/03/2020  . Diabetic ketoacidosis (HCC) 03/03/2020  . DKA (diabetic ketoacidosis) (HCC) 03/03/2020  . Ulcer of  right foot with fat layer exposed (HCC) 06/06/2019  . Non-pressure chronic ulcer of other part of left foot limited to breakdown of skin (HCC) 06/06/2019  . Uncontrolled type 2 diabetes mellitus with hyperglycemia (HCC) 05/02/2018  . 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 Venous insufficiency of both lower extremities 07/30/2017  . Obesity, Class III, BMI 40-49.9 (morbid obesity) (HCC) 07/30/2017  . Essential hypertension 09/03/2016   Past Medical History:  Diagnosis Date  . Diabetes mellitus without complication (HCC)   . Hypertension     Family History  Problem Relation Age of Onset  . Diabetes Mother   . Hyperlipidemia Father   . Hypertension Father     Past Surgical History:  Procedure Laterality Date  . AMPUTATION Left 03/06/2020   Procedure: LEFT GREAT TOE AMPUTATION;  Surgeon: [name], MD;  Location: [facility] OR;  Service: Orthopedics;  Laterality: Left;   Social History   Occupational History  . Occupation: Unemployed  Tobacco Use  . Smoking status: Never Smoker  . Smokeless tobacco: Never Used  Vaping Use  . Vaping Use: Never used  Substance and Sexual Activity  . Alcohol use: No    Alcohol/week: 0.0 standard drinks  . Drug use: No  . Sexual activity: Not on file

## 2020-07-22 ENCOUNTER — Other Ambulatory Visit: Payer: Self-pay

## 2020-07-22 ENCOUNTER — Ambulatory Visit (INDEPENDENT_AMBULATORY_CARE_PROVIDER_SITE_OTHER): Payer: No Payment, Other | Admitting: Licensed Clinical Social Worker

## 2020-07-22 DIAGNOSIS — F418 Other specified anxiety disorders: Secondary | ICD-10-CM | POA: Diagnosis not present

## 2020-07-24 NOTE — Progress Notes (Signed)
   THERAPIST PROGRESS NOTE  Session Time: 30 min  Participation Level: Active  Behavioral Response: CasualAlertAnxious  Type of Therapy: Individual Therapy  Treatment Goals addressed: Anxiety and Coping  Interventions: CBT, Supportive and Meditation: Daily Calm  Summary: [name] is a 40 y.o. male who presents with hx of dep/anx. This date pt comes for in person session. He reports no significant changes since last session. Pt has med management eval for 07/30/20 LCSW reminded pt of and encouraged him to keep. Pt states intention to do so. Pt states when he was on Adoral in his 20's for ADHD "It was like night and day" and he he was "a different person". Pt's mother pays for his medication since he has no income. LCSW assessed for pt asking Dr. Lajoyce Corners if he is cleared to walk on a regular basis. Pt states he has not done that but has an appt this Thurs and will be sure to ask. LCSW assessed for pt reading self talk literature provided last session. Pt states he did read this and has been paying more attention to his self talk. He states he was being negative "all the time" and now is catching himself and restructuring his thoughts. LCSW commended him for follow though and attention to this matter. Provided additional education on impacts for self esteem and behavioral change. LCSW assessed for pt's primary coping tools. Pt states he uses gaming, reading and music. He reports he has never tried meditation of any kind but is open to it. LCSW provided education on Daily Calm pt will investigate and use between now and next session. LCSW reviewed poc including scheduling prior to close of session. Pt states appreciation for care.    Suicidal/Homicidal: Nowithout intent/plan  Therapist Response: Pt remains receptive to care.  Plan: Return again in ~2 weeks.  Diagnosis: Axis I: Depression with anxiety    Axis II: Deferred  [place] Sink, LCSW 07/24/2020

## 2020-07-25 ENCOUNTER — Ambulatory Visit (INDEPENDENT_AMBULATORY_CARE_PROVIDER_SITE_OTHER): Payer: Self-pay | Admitting: Orthopedic Surgery

## 2020-07-25 DIAGNOSIS — L97511 Non-pressure chronic ulcer of other part of right foot limited to breakdown of skin: Secondary | ICD-10-CM

## 2020-07-25 DIAGNOSIS — M6701 Short Achilles tendon (acquired), right ankle: Secondary | ICD-10-CM

## 2020-07-26 ENCOUNTER — Encounter: Payer: Self-pay | Admitting: Physician Assistant

## 2020-07-26 NOTE — Progress Notes (Signed)
Office Visit Note   Patient: [name]           Date of Birth: [birth year]           MRN: [id number] Visit Date: 07/25/2020              Requested by: Marcine Matar, MD [address] [place],  [place] [address] PCP: Marcine Matar, MD  Chief Complaint  Patient presents with  . Right Great Toe - Follow-up      HPI: Patient is a 40 year old gentleman with ulcer right great toe he has a cavovarus foot plantarflexed first ray and Achilles contracture.  Patient states he has minimal ambulation per day and is mostly at home and only has to drive his niece to school for activities.  Assessment & Plan: Visit Diagnoses:  1. Achilles tendon contracture, right   2. Right foot ulcer, limited to breakdown of skin (HCC)     Plan: Continue Achilles stretching wound care with Dial soap cleansing and dry dressing daily.  The felt pressure relieving donut was removed this was not providing pressure relief.  Follow-Up Instructions: Return in about 4 weeks (around 08/22/2020).   Ortho Exam  Patient is alert, oriented, no adenopathy, well-dressed, normal affect, normal respiratory effort. Examination patient has a cavovarus foot with plantarflexed first ray Achilles contracture.  He has a Wagner grade 1 ulcer over the plantar aspect of the great toe he only has about 10 degrees of dorsiflexion of the great toe at the MTP joint.  The plantar ulcer is 10 mm in diameter.  After informed consent a 10 blade knife was used debride the skin and soft tissue back to healthy viable granulation tissue there is no exposed bone or tendon after debridement the ulcer is 2 cm in diameter 5 mm deep silver nitrate was used hemostasis a dry dressing was applied  Imaging: No results found. No images are attached to the encounter.  Labs: Lab Results  Component Value Date   HGBA1C 11.8 (H) 03/03/2020   HGBA1C 11.4 (A) 05/02/2018   HGBA1C 11.3 (A) 01/25/2018   ESRSEDRATE 48 (H) 03/21/2020    ESRSEDRATE 81 (H) 03/05/2020   CRP 5.6 03/21/2020   CRP 9.0 (H) 03/05/2020   REPTSTATUS 03/09/2020 FINAL 03/04/2020   CULT  03/04/2020    NO GROWTH 5 DAYS Performed at [facility], [address], [place], [place] [address]    LABORGA GROUP B STREP(S.AGALACTIAE)ISOLATED 03/02/2020     Lab Results  Component Value Date   ALBUMIN 4.3 03/26/2020   ALBUMIN 2.5 (L) 03/03/2020   ALBUMIN 3.1 (L) 03/02/2020    No results found for: MG No results found for: VD25OH  No results found for: PREALBUMIN CBC EXTENDED Latest Ref Rng & Units 03/08/2020 03/07/2020 03/06/2020  WBC 4.0 - 10.5 K/uL 6.8 7.4 8.6  RBC 4.22 - 5.81 MIL/uL 4.71 4.74 4.65  HGB 13.0 - 17.0 g/dL 12.9(L) 12.7(L) 12.3(L)  HCT 39.0 - 52.0 % 39.7 39.6 38.5(L)  PLT 150 - 400 K/uL 349 322 280  NEUTROABS 1.7 - 7.7 K/uL 3.9 4.8 5.6  LYMPHSABS 0.7 - 4.0 K/uL 1.9 1.8 2.1     There is no height or weight on file to calculate BMI.  Orders:  No orders of the defined types were placed in this encounter.  No orders of the defined types were placed in this encounter.    Procedures: No procedures performed  Clinical Data: No additional findings.  ROS:  All other systems negative, except as noted in the HPI. Review of Systems  Objective: Vital Signs: There were no vitals taken for this visit.  Specialty Comments:  No specialty comments available.  PMFS History: Patient Active Problem List   Diagnosis Date Noted  . Anxiety with depression 06/07/2020  . Hyperlipidemia due to type 2 diabetes mellitus (HCC) 03/27/2020  . Status post amputation of left great toe (HCC) 03/26/2020  . Influenza vaccination declined 03/26/2020  . 23-polyvalent pneumococcal polysaccharide vaccine declined 03/26/2020  . COVID-19 vaccine series completed 03/26/2020  . Moderate major depression, single episode (HCC) 03/26/2020  . Anaerobic bacteremia 03/07/2020  . Major depressive disorder, recurrent episode, moderate (HCC) 03/04/2020   . Abscess of great toe, left   . Osteomyelitis of left foot (HCC)   . Sepsis due to group B Streptococcus with acute renal failure (HCC)   . Cellulitis of left toe 03/03/2020  . Diabetic ketoacidosis (HCC) 03/03/2020  . DKA (diabetic ketoacidosis) (HCC) 03/03/2020  . Ulcer of right foot with fat layer exposed (HCC) 06/06/2019  . Non-pressure chronic ulcer of other part of left foot limited to breakdown of skin (HCC) 06/06/2019  . Uncontrolled type 2 diabetes mellitus with hyperglycemia (HCC) 05/02/2018  . 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 Venous insufficiency of both lower extremities 07/30/2017  . Obesity, Class III, BMI 40-49.9 (morbid obesity) (HCC) 07/30/2017  . Essential hypertension 09/03/2016   Past Medical History:  Diagnosis Date  . Diabetes mellitus without complication (HCC)   . Hypertension     Family History  Problem Relation Age of Onset  . Diabetes Mother   . Hyperlipidemia Father   . Hypertension Father     Past Surgical History:  Procedure Laterality Date  . AMPUTATION Left 03/06/2020   Procedure: LEFT GREAT TOE AMPUTATION;  Surgeon: [name], MD;  Location: [facility] OR;  Service: Orthopedics;  Laterality: Left;   Social History   Occupational History  . Occupation: Unemployed  Tobacco Use  . Smoking status: Never Smoker  . Smokeless tobacco: Never Used  Vaping Use  . Vaping Use: Never used  Substance and Sexual Activity  . Alcohol use: No    Alcohol/week: 0.0 standard drinks  . Drug use: No  . Sexual activity: Not on file

## 2020-07-29 ENCOUNTER — Other Ambulatory Visit: Payer: Self-pay | Admitting: Physician Assistant

## 2020-07-30 ENCOUNTER — Telehealth (INDEPENDENT_AMBULATORY_CARE_PROVIDER_SITE_OTHER): Payer: No Payment, Other | Admitting: Psychiatry

## 2020-07-30 ENCOUNTER — Encounter (HOSPITAL_COMMUNITY): Payer: Self-pay | Admitting: Psychiatry

## 2020-07-30 ENCOUNTER — Other Ambulatory Visit: Payer: Self-pay

## 2020-07-30 DIAGNOSIS — F900 Attention-deficit hyperactivity disorder, predominantly inattentive type: Secondary | ICD-10-CM | POA: Diagnosis not present

## 2020-07-30 DIAGNOSIS — F321 Major depressive disorder, single episode, moderate: Secondary | ICD-10-CM

## 2020-07-30 MED ORDER — SERTRALINE HCL 50 MG PO TABS: 50.0000 mg | ORAL_TABLET | Freq: Every day | ORAL | 2 refills | Status: DC

## 2020-07-30 MED ORDER — ATOMOXETINE HCL 40 MG PO CAPS
40.0000 mg | ORAL_CAPSULE | Freq: Every day | ORAL | 2 refills | Status: DC
Start: 2020-07-30 — End: 2020-10-23

## 2020-07-30 NOTE — Progress Notes (Signed)
Psychiatric Initial Adult Assessment  Virtual Visit via Video Note  I connected with [name] on 07/30/20 at 10:00 AM EST by a video enabled telemedicine application and verified that I am speaking with the correct person using two identifiers.  Location: Patient: Home Provider: Clinic   I discussed the limitations of evaluation and management by telemedicine and the availability of in person appointments. The patient expressed understanding and agreed to proceed.  I provided 30 minutes of non-face-to-face time during this encounter.    Patient Identification: [name] MRN:  [id number] Date of Evaluation:  07/30/2020 Referral Source: Karle Plumber Chief Complaint:  "The zoloft has really straightened me out, but i'm need help focusing" Visit Diagnosis:    ICD-10-CM   1. Attention deficit hyperactivity disorder (ADHD), predominantly inattentive type  F90.0 atomoxetine (STRATTERA) 40 MG capsule  2. Moderate major depression, single episode (HCC)  F32.1 sertraline (ZOLOFT) 50 MG tablet    History of Present Illness: 40 year old male seen today for initial psychiatric evaluation.  He was referred to outpatient psychiatry by his PCP for medication management.  He has a psychiatric history of anxiety and depression.  He is currently managed on Zoloft 50 mg daily.  Today provider was unable to see patient because he had his camera turned off.  During assessment he was pleasant, calm, cooperative, and engaged in conversation.  He informed provider that he has occasional anxiety and depression however notes that the Zoloft straightened him out and reports that he is able to cope with his anxiety and depression.  Provider conducted a GAD-7 and patient scored a 12.  Provider also conducted a PHQ-9 and patient scored a 13.  Today he denies SI/HI/VAH or paranoia.  Patient notes that his sleep fluctuates.  He notes at times he sleeps 5 hours and other times he sleeps 10 hours.  He  endorses having a good appetite.  Patient reports that he is having problems concentrating.  He endorses symptoms of ADHD such as distractibility, forgetfulness, inattentiveness to taxing task, poor listening skills, and disorganization.  He notes that he is only has been on medications since October however reports that he would like to get his concentration under control.  Today he is agreeable to starting Strattera 40 mg daily to help manage anxiety. Potential side effects of medication and risks vs benefits of treatment vs non-treatment were explained and discussed. All questions were answered.  He will continue all other medications as prescribed and follow-up with outpatient counseling for therapy.  No other concerns noted at this time.  Associated Signs/Symptoms: Depression Symptoms:  depressed mood, insomnia, psychomotor agitation, fatigue, feelings of worthlessness/guilt, difficulty concentrating, hopelessness, impaired memory, anxiety, loss of energy/fatigue, disturbed sleep, (Hypo) Manic Symptoms:  Distractibility, Flight of Ideas, Irritable Mood, Anxiety Symptoms:  Excessive Worry, Psychotic Symptoms:  Denies PTSD Symptoms: NA  Past Psychiatric History: Anxiety, ADHD, and depression  Previous Psychotropic Medications: Zoloft, Paxil, and trazodone  Substance Abuse History in the last 12 months:  No.  Consequences of Substance Abuse: NA  Past Medical History:  Past Medical History:  Diagnosis Date   Diabetes mellitus without complication ([place])    Hypertension     Past Surgical History:  Procedure Laterality Date   AMPUTATION Left 03/06/2020   Procedure: LEFT GREAT TOE AMPUTATION;  Surgeon: Newt Minion, MD;  Location: [place];  Service: Orthopedics;  Laterality: Left;    Family Psychiatric History: Mother depression, father depression, brother depression, maternal uncles alcohol dependence.   Family  History:  Family History  Problem Relation Age of  Onset   Diabetes Mother    Hyperlipidemia Father    Hypertension Father     Social History:   Social History   Socioeconomic History   Marital status: Single    Spouse name: Not on file   Number of children: Not on file   Years of education: Not on file   Highest education level: Not on file  Occupational History   Occupation: Unemployed  Tobacco Use   Smoking status: Never Smoker   Smokeless tobacco: Never Used  Scientific laboratory technician Use: Never used  Substance and Sexual Activity   Alcohol use: No    Alcohol/week: 0.0 standard drinks   Drug use: No   Sexual activity: Not on file  Other Topics Concern   Not on file  Social History Narrative   Live with parents in [place].    Social Determinants of Health   Financial Resource Strain: Not on file  Food Insecurity: Not on file  Transportation Needs: Not on file  Physical Activity: Not on file  Stress: Not on file  Social Connections: Not on file    Additional Social History: Patient resides with his parents in [place]. He is single and has no children. He is currently unemployed. He denies tobacco, alcohol, or illegal drug use.  Allergies:   Allergies  Allergen Reactions   Milk-Related Compounds Diarrhea   Iodine     Patient endorses unspecified reaction to iodine in past    Metabolic Disorder Labs: Lab Results  Component Value Date   HGBA1C 11.8 (H) 03/03/2020   MPG 291.96 03/03/2020   No results found for: PROLACTIN Lab Results  Component Value Date   CHOL 224 (H) 03/26/2020   TRIG 282 (H) 03/26/2020   HDL 33 (L) 03/26/2020   CHOLHDL 6.8 (H) 03/26/2020   LDLCALC 140 (H) 03/26/2020   No results found for: TSH  Therapeutic Level Labs: No results found for: LITHIUM No results found for: CBMZ No results found for: VALPROATE  Current Medications: Current Outpatient Medications  Medication Sig Dispense Refill   atomoxetine (STRATTERA) 40 MG capsule Take 1 capsule (40 mg  total) by mouth daily. 30 capsule 2   blood glucose meter kit and supplies Dispense based on patient and insurance preference. Use up to four times daily as directed. (FOR ICD-10 E10.9, E11.9). 1 each 0   glucose blood (TRUE METRIX BLOOD GLUCOSE TEST) test strip Use as instructed 100 each 12   insulin aspart protamine - aspart (NOVOLOG 70/30 MIX) (70-30) 100 UNIT/ML FlexPen Inject 0.4 mLs (40 Units total) into the skin 2 (two) times daily. 15 mL 4   Insulin Pen Needle (PEN NEEDLES) 31G X 8 MM MISC UAD 100 each 6   metFORMIN (GLUCOPHAGE) 500 MG tablet Take 2 tablets (1,000 mg total) by mouth 2 (two) times daily with a meal. 120 tablet 2   nystatin cream (MYCOSTATIN) Apply topically 2 (two) times daily. (Patient not taking: Reported on 07/29/2020) 30 g 0   pravastatin (PRAVACHOL) 20 MG tablet Take 1 tablet (20 mg total) by mouth daily. 30 tablet 3   sertraline (ZOLOFT) 50 MG tablet Take 1 tablet (50 mg total) by mouth daily. 30 tablet 2   No current facility-administered medications for this visit.    Musculoskeletal: Strength & Muscle Tone: Unable to assess due to telehealth visit [place]: Unable to assess due to telehealth visit Patient leans: N/A  Psychiatric Specialty Exam:  Review of Systems  There were no vitals taken for this visit.There is no height or weight on file to calculate BMI.  General Appearance: Unable to assess due to telehealth visit.  Patient camera off.   Eye Contact:  Unable to assess due to telehealth visit.  Patient camera off.  Speech:  Clear and Coherent and Normal Rate  Volume:  Normal  Mood:  Euthymic and Notes that he occasionally has anxiety and depression however reports he is able to cope with it.  Affect:  Appropriate and Congruent  Thought Process:  Coherent, Goal Directed and Linear  Orientation:  Full (Time, Place, and Person)  Thought Content:  WDL and Logical  Suicidal Thoughts:  No  Homicidal Thoughts:  No  Memory:  Immediate;    Good Recent;   Good Remote;   Good  Judgement:  Good  Insight:  Good  Psychomotor Activity:  Normal  Concentration:  Concentration: Good and Attention Span: Good  Recall:  Good  Fund of Knowledge:Good  Language: Good  Akathisia:  No  Handed:  Right  AIMS (if indicated): Not done  Assets:  Communication Skills Desire for Improvement Financial Resources/Insurance Housing Social Support  ADL's:  Intact  Cognition: WNL  Sleep:  Fair   Screenings: GAD-7   Flowsheet Row Video Visit from 07/30/2020 in [facility] Office Visit from 03/26/2020 in [place] Office Visit from 11/13/2017 in Primary Care at [facility]  Total GAD-7 Score _0 PHQ2-9   Flowsheet Row Video Visit from 07/30/2020 in [name] Office Visit from 03/26/2020 in [place] Office Visit from 03/21/2020 in [facility] Office Visit from 04/04/2019 in Primary Care at [place] from 03/29/2019 in Primary Care at [facility] Total Score 1 5 0 5 6  PHQ-9 Total Score 13 14 -- 20 18    Flowsheet Row Video Visit from 07/30/2020 in [place] No Risk      Assessment and Plan: Patient reports that he has occasional anxiety and depression however notes that he is able to cope with it.  He however informed provider that he is having difficulty concentrating.  Today he is agreeable to starting Strattera 40 mg to help manage symptoms of ADHD.  He will continue all other medication prescribed.  1. Moderate major depression, single episode (HCC)  Continue- sertraline (ZOLOFT) 50 MG tablet; Take 1 tablet (50 mg total) by mouth daily.  Dispense: 30 tablet; Refill: 2  2. Attention deficit hyperactivity disorder (ADHD), predominantly inattentive type  Start- atomoxetine (STRATTERA) 40 MG capsule; Take 1 capsule (40 mg  total) by mouth daily.  Dispense: 30 capsule; Refill: 2  Follow-up in 3 months Follow-up with therapy Salley Slaughter, NP 3/8/202210:55 AM

## 2020-08-06 ENCOUNTER — Ambulatory Visit (INDEPENDENT_AMBULATORY_CARE_PROVIDER_SITE_OTHER): Payer: No Payment, Other | Admitting: Licensed Clinical Social Worker

## 2020-08-06 ENCOUNTER — Other Ambulatory Visit: Payer: Self-pay

## 2020-08-06 DIAGNOSIS — F418 Other specified anxiety disorders: Secondary | ICD-10-CM | POA: Diagnosis not present

## 2020-08-07 ENCOUNTER — Other Ambulatory Visit (HOSPITAL_COMMUNITY)
Admission: RE | Admit: 2020-08-07 | Discharge: 2020-08-07 | Disposition: A | Discharge status: Home / Self Care | Payer: Self-pay | Source: Ambulatory Visit | Attending: Orthopedic Surgery | Admitting: Orthopedic Surgery

## 2020-08-07 DIAGNOSIS — Z20822 Contact with and (suspected) exposure to covid-19: Secondary | ICD-10-CM | POA: Insufficient documentation

## 2020-08-07 DIAGNOSIS — Z01812 Encounter for preprocedural laboratory examination: Secondary | ICD-10-CM | POA: Insufficient documentation

## 2020-08-07 LAB — SARS CORONAVIRUS 2 (TAT 6-24 HRS): SARS Coronavirus 2: NEGATIVE

## 2020-08-08 ENCOUNTER — Encounter (HOSPITAL_COMMUNITY): Payer: Self-pay | Admitting: Orthopedic Surgery

## 2020-08-08 MED ORDER — DEXTROSE 5 % IV SOLN
3.0000 g | INTRAVENOUS | Status: DC
  Filled 2020-08-08 (×2): qty 3000

## 2020-08-08 MED ORDER — DEXTROSE 5 % IV SOLN
3.0000 g | INTRAVENOUS | Status: DC
  Filled 2020-08-08: qty 3000

## 2020-08-08 NOTE — Progress Notes (Signed)
Patient denies shortness of breath, fever, cough or chest pain.  PCP - Dr [name] Cardiologist - n/a  Chest x-ray - 03/02/20 (2V) EKG - 03/02/20 Stress Test - n/a ECHO - n/a Cardiac Cath - n/a  ERAS: Clear liquids til 4:30 am DOS.  Fasting Blood Sugar - 130-150s Checks Blood Sugar occasional checks  . Do not take metformin on the the morning of surgery.  . THE NIGHT BEFORE SURGERY, do not take Novolog 70/30 tonight and the morning of surgery.  . If your blood sugar is less than 70 mg/dL, you will need to treat for low blood sugar: o Treat a low blood sugar (less than 70 mg/dL) with  cup of clear juice (cranberry or apple), 4 glucose tablets, OR glucose gel. o Recheck blood sugar in 15 minutes after treatment (to make sure it is greater than 70 mg/dL). If your blood sugar is not greater than 70 mg/dL on recheck, call [phone] for further instructions.  STOP now taking any Aspirin (unless otherwise instructed by your surgeon), Aleve, Naproxen, Ibuprofen, Motrin, Advil, Goody's, BC's, all herbal medications, fish oil, and all vitamins.   Coronavirus Screening Covid test on 08/07/20 was negative.  Patient verbalized understanding of instructions that were given via phone.

## 2020-08-08 NOTE — Progress Notes (Signed)
   THERAPIST PROGRESS NOTE  Session Time: 40 min  Participation Level: Active  Behavioral Response: CasualAlertAnxious  Type of Therapy: Individual Therapy  Treatment Goals addressed: Anxiety and Coping  Interventions: Supportive  Summary: [name] is a 40 y.o. male who presents with hx of dep/anx. This date pt comes for in person session per his preference. Pt states he had ADHD addressed at last med appt with new med added, Strattera. Pt reports his thinking is more clear, more attentive and more energy. Pt reports he is having better follow through with tasks/plans he wishes to complete. Pt reports Dr. Lajoyce Corners wants to do a surgical procedure on him this Friday r/t foot and calf. He advises she is waiting to hear back from the financial advisors to see if he is approved for procedure given he has no ins and cost quoted at $9,000. He advises he will have to stay off his leg for a while post surgery if he is approved so walking is not an option to help with mood at this time. Pt states he feels his mood is better managed on current meds at this time. Pt reports he is continuing to pay attention to self talk when asked. He states he is much less negative now that he is more mindful. LCSW assessed for goals pt would like to address going forward. Pt states he would like to get enrolled at [facility] and he would like to be able to go to work. LCSW commended pt for both goals and assured of support for both going forward. Remainder of session spent addressing coping skills/strategies. Pt states he failed to try Daily Calm and commits to doing so before next session. Primarily using reading, gaming, music. LCSW reviewed poc including scheduling prior to close of session. Pt states appreciation for care.    Suicidal/Homicidal: Nowithout intent/plan  Therapist Response: Pt remains responsive to care.  Plan: Return again in ~4 weeks.  Diagnosis: Axis I: depression with anxiety    Axis II:  Deferred  Cuartelez Sink, LCSW 08/08/2020

## 2020-08-09 ENCOUNTER — Encounter (HOSPITAL_COMMUNITY)
Admission: RE | Disposition: A | Discharge status: Home / Self Care | Payer: Self-pay | Source: Home / Self Care | Attending: Orthopedic Surgery

## 2020-08-09 ENCOUNTER — Other Ambulatory Visit: Payer: Self-pay

## 2020-08-09 ENCOUNTER — Ambulatory Visit (HOSPITAL_COMMUNITY): Payer: Self-pay

## 2020-08-09 ENCOUNTER — Encounter (HOSPITAL_COMMUNITY): Payer: Self-pay | Admitting: Certified Registered Nurse Anesthetist

## 2020-08-09 ENCOUNTER — Other Ambulatory Visit: Payer: Self-pay | Admitting: Physician Assistant

## 2020-08-09 ENCOUNTER — Encounter (HOSPITAL_COMMUNITY): Payer: Self-pay | Admitting: Orthopedic Surgery

## 2020-08-09 ENCOUNTER — Ambulatory Visit (HOSPITAL_COMMUNITY)
Admission: RE | Admit: 2020-08-09 | Discharge: 2020-08-09 | Disposition: A | Discharge status: Home / Self Care | Payer: Self-pay | Attending: Orthopedic Surgery | Admitting: Orthopedic Surgery

## 2020-08-09 DIAGNOSIS — Z888 Allergy status to other drugs, medicaments and biological substances status: Secondary | ICD-10-CM | POA: Insufficient documentation

## 2020-08-09 DIAGNOSIS — M6701 Short Achilles tendon (acquired), right ankle: Secondary | ICD-10-CM | POA: Insufficient documentation

## 2020-08-09 DIAGNOSIS — Z79899 Other long term (current) drug therapy: Secondary | ICD-10-CM | POA: Insufficient documentation

## 2020-08-09 DIAGNOSIS — E11621 Type 2 diabetes mellitus with foot ulcer: Secondary | ICD-10-CM | POA: Insufficient documentation

## 2020-08-09 DIAGNOSIS — Z8249 Family history of ischemic heart disease and other diseases of the circulatory system: Secondary | ICD-10-CM | POA: Insufficient documentation

## 2020-08-09 DIAGNOSIS — L97519 Non-pressure chronic ulcer of other part of right foot with unspecified severity: Secondary | ICD-10-CM | POA: Insufficient documentation

## 2020-08-09 DIAGNOSIS — Z833 Family history of diabetes mellitus: Secondary | ICD-10-CM | POA: Insufficient documentation

## 2020-08-09 DIAGNOSIS — Z91011 Allergy to milk products: Secondary | ICD-10-CM | POA: Insufficient documentation

## 2020-08-09 DIAGNOSIS — Z539 Procedure and treatment not carried out, unspecified reason: Secondary | ICD-10-CM | POA: Insufficient documentation

## 2020-08-09 DIAGNOSIS — I10 Essential (primary) hypertension: Secondary | ICD-10-CM | POA: Insufficient documentation

## 2020-08-09 DIAGNOSIS — Z794 Long term (current) use of insulin: Secondary | ICD-10-CM | POA: Insufficient documentation

## 2020-08-09 HISTORY — DX: Bipolar disorder, unspecified: F31.9

## 2020-08-09 HISTORY — DX: Depression, unspecified: F32.A

## 2020-08-09 HISTORY — DX: Anxiety disorder, unspecified: F41.9

## 2020-08-09 HISTORY — DX: Attention-deficit hyperactivity disorder, unspecified type: F90.9

## 2020-08-09 HISTORY — DX: Hyperlipidemia, unspecified: E78.5

## 2020-08-09 LAB — BASIC METABOLIC PANEL
Anion gap: 11 (ref 5–15)
BUN: 10 mg/dL (ref 6–20)
CO2: 24 mmol/L (ref 22–32)
Calcium: 9.2 mg/dL (ref 8.9–10.3)
Chloride: 99 mmol/L (ref 98–111)
Creatinine, Ser: 0.84 mg/dL (ref 0.61–1.24)
GFR, Estimated: >60 mL/min (ref >60)
Glucose, Bld: 243 mg/dL — ABNORMAL HIGH (ref 70–99)
Potassium: 4.2 mmol/L (ref 3.5–5.1)
Sodium: 134 mmol/L — ABNORMAL LOW (ref 135–145)

## 2020-08-09 LAB — GLUCOSE, CAPILLARY: Glucose-Capillary: 241 mg/dL — ABNORMAL HIGH (ref 70–99)

## 2020-08-09 LAB — CBC
HCT: 46.7 % (ref 39.0–52.0)
Hemoglobin: 15.5 g/dL (ref 13.0–17.0)
MCH: 27.7 pg (ref 26.0–34.0)
MCHC: 33.2 g/dL (ref 30.0–36.0)
MCV: 83.5 fL (ref 80.0–100.0)
Platelets: 275 10*3/uL (ref 150–400)
RBC: 5.59 MIL/uL (ref 4.22–5.81)
RDW: 13.3 % (ref 11.5–15.5)
WBC: 14.8 10*3/uL — ABNORMAL HIGH (ref 4.0–10.5)
nRBC: 0 % (ref 0.0–0.2)

## 2020-08-09 SURGERY — FUSION, JOINT, FOOT
Anesthesia: Choice | Site: Foot | Laterality: Right

## 2020-08-09 MED ORDER — PROPOFOL 10 MG/ML IV BOLUS
INTRAVENOUS | Status: AC
  Filled 2020-08-09: qty 60

## 2020-08-09 MED ORDER — MIDAZOLAM HCL 2 MG/2ML IJ SOLN
INTRAMUSCULAR | Status: AC
  Filled 2020-08-09: qty 2

## 2020-08-09 MED ORDER — ORAL CARE MOUTH RINSE: 15.0000 mL | Freq: Once | OROMUCOSAL | Status: AC

## 2020-08-09 MED ORDER — BUPIVACAINE HCL (PF) 0.25 % IJ SOLN
INTRAMUSCULAR | Status: AC
  Filled 2020-08-09: qty 30

## 2020-08-09 MED ORDER — CHLORHEXIDINE GLUCONATE 0.12 % MT SOLN: 15.0000 mL | Freq: Once | OROMUCOSAL | Status: AC

## 2020-08-09 MED ORDER — LACTATED RINGERS IV SOLN: INTRAVENOUS | Status: DC

## 2020-08-09 MED ORDER — CHLORHEXIDINE GLUCONATE 0.12 % MT SOLN
OROMUCOSAL | Status: AC
  Administered 2020-08-09: 15 mL via OROMUCOSAL
  Filled 2020-08-09: qty 15

## 2020-08-09 MED ORDER — FENTANYL CITRATE (PF) 250 MCG/5ML IJ SOLN
INTRAMUSCULAR | Status: AC
  Filled 2020-08-09: qty 5

## 2020-08-09 MED ORDER — DOXYCYCLINE HYCLATE 100 MG PO TABS: 100.0000 mg | ORAL_TABLET | Freq: Two times a day (BID) | ORAL | 0 refills | Status: DC

## 2020-08-09 NOTE — Progress Notes (Signed)
Patient ID: [name], male   DOB: [birth year], 40 y.o.   MRN: [id number] Patient was scheduled for right foot and right calf surgery today.  Patient states he started developing cellulitis last night that was painful in the left lower extremity.  On examination has a cellulitis involving the entire left calf this is tender to palpation.  Surgery is canceled due to his acute cellulitis of the left leg we will call in a prescription for doxycycline and recommended knee-high compression stockings that he could obtain at [facility].

## 2020-08-09 NOTE — H&P (Signed)
[name] is an 40 y.o. male.   Chief Complaint: Right cavovarus foot deformity HPI: Patient is a 40 year old gentleman with ulcer right great toe he has a cavovarus foot plantarflexed first ray and Achilles contracture.  Patient states he has minimal ambulation per day and is mostly at home and only has to drive his niece to school for activities.  Past Medical History:  Diagnosis Date  . ADHD   . Anxiety   . Bipolar disorder ([place])   . Depression   . Diabetes mellitus without complication ([place])    type 2  . HLD (hyperlipidemia)   . Hypertension    no meds    Past Surgical History:  Procedure Laterality Date  . AMPUTATION Left 03/06/2020   Procedure: LEFT GREAT TOE AMPUTATION;  Surgeon: Newt Minion, MD;  Location: [place];  Service: Orthopedics;  Laterality: Left;  . EYE SURGERY Bilateral    lasik  . WISDOM TOOTH EXTRACTION      Family History  Problem Relation Age of Onset  . Diabetes Mother   . Hyperlipidemia Father   . Hypertension Father    Social History:  reports that he has never smoked. He has never used smokeless tobacco. He reports that he does not drink alcohol and does not use drugs.  Allergies:  Allergies  Allergen Reactions  . Milk-Related Compounds Diarrhea  . Iodine     Patient endorses unspecified reaction to iodine in past.. Just a one time thing.    Medications Prior to Admission  Medication Sig Dispense Refill  . atomoxetine (STRATTERA) 40 MG capsule Take 1 capsule (40 mg total) by mouth daily. 30 capsule 2  . insulin aspart protamine - aspart (NOVOLOG 70/30 MIX) (70-30) 100 UNIT/ML FlexPen Inject 0.4 mLs (40 Units total) into the skin 2 (two) times daily. 15 mL 4  . metFORMIN (GLUCOPHAGE) 500 MG tablet Take 2 tablets (1,000 mg total) by mouth 2 (two) times daily with a meal. 120 tablet 2  . pravastatin (PRAVACHOL) 20 MG tablet Take 1 tablet (20 mg total) by mouth daily. 30 tablet 3  . sertraline (ZOLOFT) 50 MG tablet Take 1 tablet (50 mg  total) by mouth daily. 30 tablet 2  . blood glucose meter kit and supplies Dispense based on patient and insurance preference. Use up to four times daily as directed. (FOR ICD-10 E10.9, E11.9). 1 each 0  . glucose blood (TRUE METRIX BLOOD GLUCOSE TEST) test strip Use as instructed 100 each 12  . Insulin Pen Needle (PEN NEEDLES) 31G X 8 MM MISC UAD 100 each 6  . nystatin cream (MYCOSTATIN) Apply topically 2 (two) times daily. (Patient not taking: Reported on 07/29/2020) 30 g 0    Results for orders placed or performed during the hospital encounter of 08/09/20 (from the past 48 hour(s))  Glucose, capillary     Status: Abnormal   Collection Time: 08/09/20  6:01 AM  Result Value Ref Range   Glucose-Capillary 241 (H) 70 - 99 mg/dL    Comment: Glucose reference range applies only to samples taken after fasting for at least 8 hours.   No results found.  Review of Systems  All other systems reviewed and are negative.   Blood pressure 127/72, pulse 100, temperature 98.2 F (36.8 C), resp. rate 18, height _0  (1.854 m), weight (!) 149.7 kg, SpO2 99 %. Physical Exam  Patient is alert, oriented, no adenopathy, well-dressed, normal affect, normal respiratory effort. Examination patient has a cavovarus foot with plantarflexed  first ray Achilles contracture.  He has a Wagner grade 1 ulcer over the plantar aspect of the great toe he only has about 10 degrees of dorsiflexion of the great toe at the MTP joint.  The plantar ulcer is 10 mm in diameter.  After informed consent a 10 blade knife was used debride the skin and soft tissue back to healthy viable granulation tissue there is no exposed bone or tendon after debridement the ulcer is 2 cm in diameter 5 mm deep silver nitrate was used hemostasis a dry dressing was applied,heart RRR Lungs Clear Assessment/Plan 1. Achilles tendon contracture, right   2. Right foot ulcer, limited to breakdown of skin ([place])     Plan: Continue Achilles stretching wound  care with Dial soap cleansing and dry dressing daily.  The felt pressure relieving donut was removed this was not providing pressure relief.   Bevely Palmer Persons, PA 08/09/2020, 6:34 AM

## 2020-08-09 NOTE — Progress Notes (Signed)
Per surgeon surgery cancelled, surgeon in room with patient to discuss further plans.

## 2020-08-09 NOTE — Anesthesia Preprocedure Evaluation (Deleted)
Anesthesia Evaluation  Patient identified by MRN, date of birth, ID band Patient awake    Reviewed: Allergy & Precautions, NPO status , Patient's Chart, lab work & pertinent test results  History of Anesthesia Complications Negative for: history of anesthetic complications  Airway Mallampati: II  TM Distance: >3 FB Neck ROM: Full    Dental   Pulmonary neg pulmonary ROS,    Pulmonary exam normal        Cardiovascular hypertension, Normal cardiovascular exam     Neuro/Psych Anxiety Depression Bipolar Disorder negative neurological ROS     GI/Hepatic negative GI ROS, Neg liver ROS,   Endo/Other  diabetes, Type 2, Oral Hypoglycemic Agents, Insulin DependentMorbid obesity  Renal/GU negative Renal ROS  negative genitourinary   Musculoskeletal negative musculoskeletal ROS (+)   Abdominal   Peds  Hematology negative hematology ROS (+)   Anesthesia Other Findings   Reproductive/Obstetrics                            Anesthesia Physical Anesthesia Plan  ASA: III  Anesthesia Plan: General   Post-op Pain Management: GA combined w/ Regional for post-op pain   Induction: Intravenous  PONV Risk Score and Plan: 2 and Ondansetron, Dexamethasone, Midazolam and Treatment may vary due to age or medical condition  Airway Management Planned: LMA  Additional Equipment: None  Intra-op Plan:   Post-operative Plan: Extubation in OR  Informed Consent: I have reviewed the patients History and Physical, chart, labs and discussed the procedure including the risks, benefits and alternatives for the proposed anesthesia with the patient or authorized representative who has indicated his/her understanding and acceptance.     Dental advisory given  Plan Discussed with:   Anesthesia Plan Comments:         Anesthesia Quick Evaluation

## 2020-08-09 NOTE — Progress Notes (Signed)
Patient discharged from pre-op department, taken out via wheelchair to mom in waiting area.

## 2020-09-09 ENCOUNTER — Encounter: Payer: Self-pay | Admitting: Orthopedic Surgery

## 2020-09-09 ENCOUNTER — Ambulatory Visit (INDEPENDENT_AMBULATORY_CARE_PROVIDER_SITE_OTHER): Payer: Self-pay | Admitting: Orthopedic Surgery

## 2020-09-09 DIAGNOSIS — M6701 Short Achilles tendon (acquired), right ankle: Secondary | ICD-10-CM

## 2020-09-09 DIAGNOSIS — L97511 Non-pressure chronic ulcer of other part of right foot limited to breakdown of skin: Secondary | ICD-10-CM

## 2020-09-09 NOTE — Progress Notes (Signed)
Office Visit Note   Patient: [name]           Date of Birth: 1981/05/24           MRN: [id number] Visit Date: 09/09/2020              Requested by: Marcine Matar, MD [address] [place],  [place] [address] PCP: Marcine Matar, MD  Chief Complaint  Patient presents with  . Right Foot - Follow-up      HPI: Patient is seen in follow-up for both lower extremities he was scheduled for a closing wedge osteotomy base of the right first metatarsal and a gastrocnemius recession to unload pressure from the great toe ulcer patient had cellulitis of the left leg he was started on doxycycline he states the cellulitis has resolved.  Assessment & Plan: Visit Diagnoses:  1. Right foot ulcer, limited to breakdown of skin (HCC)   2. Achilles tendon contracture, right     Plan: We will reschedule patient for a dorsal closing wedge osteotomy first metatarsal as well as a gastrocnemius recession.  Plan for outpatient surgery at [facility]  Follow-Up Instructions: Return if symptoms worsen or fail to improve.   Ortho Exam  Patient is alert, oriented, no adenopathy, well-dressed, normal affect, normal respiratory effort. Examination there is no cellulitis in the left leg patient does have dermatitis of both legs from venous insufficiency.  He has a Educational psychologist grade 1 ulcer beneath the right great toe this is approximately 10 mm in diameter and 5 mm deep there is no exposed bone or tendon there is no cellulitis.  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  Imaging: No results found. No images are attached to the encounter.  Labs: Lab Results  Component Value Date   HGBA1C 11.8 (H) 03/03/2020   HGBA1C 11.4 (A) 05/02/2018   HGBA1C 11.3 (A) 01/25/2018   ESRSEDRATE 48 (H) 03/21/2020   ESRSEDRATE 81 (H) 03/05/2020   CRP 5.6 03/21/2020   CRP 9.0 (H) 03/05/2020    REPTSTATUS 03/09/2020 FINAL 03/04/2020   CULT  03/04/2020    NO GROWTH 5 DAYS Performed at [facility], [address] [place], [place] [address]    LABORGA GROUP B STREP(S.AGALACTIAE)ISOLATED 03/02/2020     Lab Results  Component Value Date   ALBUMIN 4.3 03/26/2020   ALBUMIN 2.5 (L) 03/03/2020   ALBUMIN 3.1 (L) 03/02/2020    No results found for: MG No results found for: VD25OH  No results found for: PREALBUMIN CBC EXTENDED Latest Ref Rng & Units 08/09/2020 03/08/2020 03/07/2020  WBC 4.0 - 10.5 K/uL 14.8(H) 6.8 7.4  RBC 4.22 - 5.81 MIL/uL 5.59 4.71 4.74  HGB 13.0 - 17.0 g/dL 93.8 12.9(L) 12.7(L)  HCT 39.0 - 52.0 % 46.7 39.7 39.6  PLT 150 - 400 K/uL 275 349 322  NEUTROABS 1.7 - 7.7 K/uL - 3.9 4.8  LYMPHSABS 0.7 - 4.0 K/uL - 1.9 1.8     There is no height or weight on file to calculate BMI.  Orders:  No orders of the defined types were placed in this encounter.  No orders of the defined types were placed in this encounter.    Procedures: No procedures performed  Clinical Data: No additional findings.  ROS:  All other systems negative, except as noted in the HPI. Review of Systems  Objective: Vital Signs: There  were no vitals taken for this visit.  Specialty Comments:  No specialty comments available.  PMFS History: Patient Active Problem List   Diagnosis Date Noted  . Attention deficit hyperactivity disorder (ADHD), predominantly inattentive type 07/30/2020  . Anxiety with depression 06/07/2020  . Hyperlipidemia due to type 2 diabetes mellitus (HCC) 03/27/2020  . Status post amputation of left great toe (HCC) 03/26/2020  . Influenza vaccination declined 03/26/2020  . 23-polyvalent pneumococcal polysaccharide vaccine declined 03/26/2020  . COVID-19 vaccine series completed 03/26/2020  . Moderate major depression, single episode (HCC) 03/26/2020  . Anaerobic bacteremia 03/07/2020  . Major depressive disorder, recurrent episode, moderate (HCC)  03/04/2020  . Abscess of great toe, left   . Osteomyelitis of left foot (HCC)   . Sepsis due to group B Streptococcus with acute renal failure (HCC)   . Cellulitis of left toe 03/03/2020  . Diabetic ketoacidosis (HCC) 03/03/2020  . DKA (diabetic ketoacidosis) (HCC) 03/03/2020  . Ulcer of right foot with fat layer exposed (HCC) 06/06/2019  . Non-pressure chronic ulcer of other part of left foot limited to breakdown of skin (HCC) 06/06/2019  . Uncontrolled type 2 diabetes mellitus with hyperglycemia (HCC) 05/02/2018  . 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 Venous insufficiency of both lower extremities 07/30/2017  . Obesity, Class III, BMI 40-49.9 (morbid obesity) (HCC) 07/30/2017  . Essential hypertension 09/03/2016   Past Medical History:  Diagnosis Date  . ADHD   . Anxiety   . Bipolar disorder (HCC)   . Depression   . Diabetes mellitus without complication (HCC)    type 2  . HLD (hyperlipidemia)   . Hypertension    no meds    Family History  Problem Relation Age of Onset  . Diabetes Mother   . Hyperlipidemia Father   . Hypertension Father     Past Surgical History:  Procedure Laterality Date  . AMPUTATION Left 03/06/2020   Procedure: LEFT GREAT TOE AMPUTATION;  Surgeon: [name], MD;  Location: [name] OR;  Service: Orthopedics;  Laterality: Left;  . EYE SURGERY Bilateral    lasik  . WISDOM TOOTH EXTRACTION     Social History   Occupational History  . Occupation: Unemployed  Tobacco Use  . Smoking status: Never Smoker  . Smokeless tobacco: Never Used  Vaping Use  . Vaping Use: Never used  Substance and Sexual Activity  . Alcohol use: No    Alcohol/week: 0.0 standard drinks  . Drug use: No  . Sexual activity: Not on file

## 2020-09-11 ENCOUNTER — Ambulatory Visit (INDEPENDENT_AMBULATORY_CARE_PROVIDER_SITE_OTHER): Payer: No Payment, Other | Admitting: Licensed Clinical Social Worker

## 2020-09-11 ENCOUNTER — Other Ambulatory Visit: Payer: Self-pay

## 2020-09-11 DIAGNOSIS — F418 Other specified anxiety disorders: Secondary | ICD-10-CM | POA: Diagnosis not present

## 2020-09-13 NOTE — Progress Notes (Signed)
   THERAPIST PROGRESS NOTE  Session Time: 40 min  Participation Level: Active  Behavioral Response: Casual and body odorAlertAnxious  Type of Therapy: Individual Therapy  Treatment Goals addressed: Anxiety and Coping  Interventions: CBT and Supportive  Summary: [name] is a 40 y.o. male who presents with hx of dep/anx. This date pt returns for in person session. He is noted to be engaged during session yet bouncing his leg most of session. Pt states his surgery on R leg was postponed d/t cellulitis developing in L leg. Pt reports cellulitis has essentially cleared with oral antibiotics. He is waiting to hear back about date for surgery to be rescheduled. Pt states he is mostly keeping his blood sugar in acceptable range but admits he forgets his insulin at times. Pt taking MH meds as prescribed. He reports he continues to be better at follow through and states he thinks he is less irritable. Pt's sleep remains poor. He will be sure to address this with med provider next visit. Pt states last night he went to be at 3am. He states he is often watching movies or gaming with friends. Education provided on importance of adequate rest schedule for coping and attending to goals. Additional assessment of pts desires r/t work/school facilitated. Pt states he would like to do something with computers. He reports he has been enrolled at [employer] before and will do some exploration of options. Pt brings up Daily Calm. He states he did try this coping strategy and says "I lasted about 40 seconds". He said the guided meditation was not for him. Reports he does better with just low music or rain sounds, no talking. LCSW thanked him for trying and acknowledged successful strategies vary for different people. Pt states he remains aware of self talk but it is hard to be vigilant all the time. LCSW encouraged him to read literature weekly to keep awareness in focus. Pt shares concern for his niece in 6th grade  r/t her bio mom. He provides details. He continues to pick niece up from school most days and is trying to be supportive for her. Provided education on need for professional support as well. Pt denies other worries/concerns. LCSW reviewed poc. Pt states appreciation for care.     Suicidal/Homicidal: Nowithout intent/plan  Therapist Response: Pt remains receptive to care.  Plan: Return again in 4 weeks.  Diagnosis: Axis I: Depression with anxiety  [place] Sink, LCSW 09/13/2020

## 2020-09-27 ENCOUNTER — Encounter: Payer: Self-pay | Admitting: Internal Medicine

## 2020-09-27 ENCOUNTER — Other Ambulatory Visit: Payer: Self-pay

## 2020-09-27 ENCOUNTER — Ambulatory Visit: Payer: Self-pay | Attending: Internal Medicine | Admitting: Internal Medicine

## 2020-09-27 DIAGNOSIS — E1169 Type 2 diabetes mellitus with other specified complication: Secondary | ICD-10-CM

## 2020-09-27 DIAGNOSIS — E08621 Diabetes mellitus due to underlying condition with foot ulcer: Secondary | ICD-10-CM

## 2020-09-27 DIAGNOSIS — L97502 Non-pressure chronic ulcer of other part of unspecified foot with fat layer exposed: Secondary | ICD-10-CM

## 2020-09-27 DIAGNOSIS — Z89412 Acquired absence of left great toe: Secondary | ICD-10-CM

## 2020-09-27 DIAGNOSIS — L97522 Non-pressure chronic ulcer of other part of left foot with fat layer exposed: Secondary | ICD-10-CM

## 2020-09-27 DIAGNOSIS — R030 Elevated blood-pressure reading, without diagnosis of hypertension: Secondary | ICD-10-CM

## 2020-09-27 DIAGNOSIS — E785 Hyperlipidemia, unspecified: Secondary | ICD-10-CM

## 2020-09-27 LAB — POCT GLYCOSYLATED HEMOGLOBIN (HGB A1C): HbA1c, POC (controlled diabetic range): 9.1 % — AB (ref 0.0–7.0)

## 2020-09-27 LAB — GLUCOSE, POCT (MANUAL RESULT ENTRY): POC Glucose: 161 mg/dl — AB (ref 70–99)

## 2020-09-27 MED ORDER — INSULIN ASPART PROT & ASPART (70-30 MIX) 100 UNIT/ML PEN
40.0000 [IU] | PEN_INJECTOR | Freq: Two times a day (BID) | SUBCUTANEOUS | 11 refills | Status: DC
  Filled 2020-09-27: qty 15, 18d supply, fill #0
  Filled 2020-10-11 (×2): qty 21, 26d supply, fill #0
  Filled 2020-11-11: qty 21, 26d supply, fill #1

## 2020-09-27 MED ORDER — INSULIN ASPART PROT & ASPART (70-30 MIX) 100 UNIT/ML PEN
40.0000 [IU] | PEN_INJECTOR | Freq: Two times a day (BID) | SUBCUTANEOUS | 11 refills | Status: DC
  Filled 2020-09-27: qty 15, 19d supply, fill #0

## 2020-09-27 MED ORDER — PRAVASTATIN SODIUM 20 MG PO TABS: 20.0000 mg | ORAL_TABLET | Freq: Every day | ORAL | 6 refills | Status: DC

## 2020-09-27 NOTE — Progress Notes (Signed)
Patient ID: [name], male    DOB: [birth year]  MRN: [id number]  CC: Diabetes   Subjective: [name] is a 40 y.o. male who presents for chronic ds management His concerns today include:  Pt with hx of DM type 2, HL, HTN (off meds x 15 yrs) obesity, DM foot ulcers, amputation LT big toe(02/2020), MDD, obesity,  DM: Results for orders placed or performed in visit on 09/27/20  POCT glucose (manual entry)  Result Value Ref Range   POC Glucose 161 (A) 70 - 99 mg/dl  POCT glycosylated hemoglobin (Hb A1C)  Result Value Ref Range   Hemoglobin A1C     HbA1c POC (<> result, manual entry)     HbA1c, POC (prediabetic range)     HbA1c, POC (controlled diabetic range) 9.1 (A) 0.0 - 7.0 %  BS: His A1c has improved from 11.  Does not have log with him but reports checking twice a day. Before BF 120-160, after dinner about same Admits that he had not been taking the a.m. dose of metformin and his NovoLog insulin consistently for several weeks.  States that he wakes up late morning and just does not remember to take it.  However he started taking more consistently over the past 2 weeks.  Also concerned about the cost of his insulin which he purchased at [employer].  He is uninsured.  Some diarrhea off and on since being on metformin but states it is not a major issue at this time. Not eating out as much. Staying away from potatoes wgh up 22 lbs. attributes this to not being as active due to ulcers on both feet Followed by Dr. Sharol Given.  Plans to do a procedure called dorsal closing wedge osteotomy on the right big toe + numbness feet LT>RT  HL:  Out Pravachol 1 mth  Blood pressure noted to be elevated today.  Reports having had history of hypertension in the past but has been off blood pressure medications for years.  I note that his 2 recent blood pressure readings in the chart were 127/72 and 123/81 both of which are good. Patient Active Problem List   Diagnosis Date Noted  .  Attention deficit hyperactivity disorder (ADHD), predominantly inattentive type 07/30/2020  . Anxiety with depression 06/07/2020  . Hyperlipidemia due to type 2 diabetes mellitus ([place]) 03/27/2020  . Status post amputation of left great toe ([place]) 03/26/2020  . Influenza vaccination declined 03/26/2020  . 23-polyvalent pneumococcal polysaccharide vaccine declined 03/26/2020  . COVID-19 vaccine series completed 03/26/2020  . Moderate major depression, single episode ([place]) 03/26/2020  . Anaerobic bacteremia 03/07/2020  . Major depressive disorder, recurrent episode, moderate ([place]) 03/04/2020  . Abscess of great toe, left   . Sepsis due to group B Streptococcus with acute renal failure ([place])   . Cellulitis of left toe 03/03/2020  . Diabetic ketoacidosis ([place]) 03/03/2020  . DKA (diabetic ketoacidosis) ([place]) 03/03/2020  . Ulcer of right foot with fat layer exposed ([place]) 06/06/2019  . Non-pressure chronic ulcer of other part of left foot limited to breakdown of skin ([place]) 06/06/2019  . Uncontrolled type 2 diabetes mellitus with hyperglycemia ([place]) 05/02/2018  . Diabetic ulcer of left great toe ([place]) 07/30/2017  . Venous insufficiency of both lower extremities 07/30/2017  . Obesity, Class III, BMI 40-49.9 (morbid obesity) ([place]) 07/30/2017  . Essential hypertension 09/03/2016     Current Outpatient Medications on File Prior to Visit  Medication Sig Dispense Refill  . atomoxetine (STRATTERA) 40 MG  capsule Take 1 capsule (40 mg total) by mouth daily. 30 capsule 2  . blood glucose meter kit and supplies Dispense based on patient and insurance preference. Use up to four times daily as directed. (FOR ICD-10 E10.9, E11.9). 1 each 0  . Blood Glucose Monitoring Suppl (TRUE METRIX METER) w/Device KIT USE AS DIRECTED 1 kit 0  . glucose blood test strip USE AS DIRECTED FOUR TIMES DAILY (Patient taking differently: 4 (four) times daily. as directed) 100 strip 0  . glucose blood test strip USE AS INSTRUCTED  100 strip 12  . Insulin Pen Needle (PEN NEEDLES) 31G X 8 MM MISC UAD 100 each 6  . Insulin Pen Needle 31G X 8 MM MISC USE AS DIRECTED 100 each 6  . Insulin Pen Needle 32G X 4 MM MISC USE AS DIRECTED WITH INSULIN (Patient taking differently: See admin instructions. with insulin) 100 each 0  . metFORMIN (GLUCOPHAGE) 500 MG tablet Take 2 tablets (1,000 mg total) by mouth 2 (two) times daily with a meal. 120 tablet 2  . nystatin cream (MYCOSTATIN) Apply topically 2 (two) times daily. 30 g 0  . sertraline (ZOLOFT) 25 MG tablet TAKE 1 TABLET BY MOUTH DAILY 30 tablet 0  . sertraline (ZOLOFT) 50 MG tablet Take 1 tablet (50 mg total) by mouth daily. 30 tablet 2  . nystatin cream (MYCOSTATIN) APPLY TOPICALLY TWO TIMES DAILY (Patient not taking: Reported on 09/27/2020) 30 g 0   No current facility-administered medications on file prior to visit.    Allergies  Allergen Reactions  . Milk-Related Compounds Diarrhea  . Iodine     Patient endorses unspecified reaction to iodine in past.. Just a one time thing.    Social History   Socioeconomic History  . Marital status: Single    Spouse name: Not on file  . Number of children: Not on file  . Years of education: Not on file  . Highest education level: Not on file  Occupational History  . Occupation: Unemployed  Tobacco Use  . Smoking status: Never Smoker  . Smokeless tobacco: Never Used  Vaping Use  . Vaping Use: Never used  Substance and Sexual Activity  . Alcohol use: No    Alcohol/week: 0.0 standard drinks  . Drug use: No  . Sexual activity: Not on file  Other Topics Concern  . Not on file  Social History Narrative   Live with parents in [place].    Social Determinants of Health   Financial Resource Strain: Not on file  Food Insecurity: Not on file  Transportation Needs: Not on file  Physical Activity: Not on file  Stress: Not on file  Social Connections: Not on file  Intimate Partner Violence: Not on file    Family  History  Problem Relation Age of Onset  . Diabetes Mother   . Hyperlipidemia Father   . Hypertension Father     Past Surgical History:  Procedure Laterality Date  . AMPUTATION Left 03/06/2020   Procedure: LEFT GREAT TOE AMPUTATION;  Surgeon: Newt Minion, MD;  Location: [place];  Service: Orthopedics;  Laterality: Left;  . EYE SURGERY Bilateral    lasik  . WISDOM TOOTH EXTRACTION      ROS: Review of Systems Negative except as stated above  PHYSICAL EXAM: BP 133/87   Pulse (!) 109   Resp 20   Ht '6\' 1"'  (1.854 m)   Wt (!) 352 lb 6.4 oz (159.8 kg)   SpO2 98%   BMI 46.49  kg/m   Wt Readings from Last 3 Encounters:  09/27/20 (!) 352 lb 6.4 oz (159.8 kg)  08/09/20 (!) 330 lb (149.7 kg)  07/04/20 (!) 330 lb (149.7 kg)    Physical Exam General appearance - alert, well appearing, morbidly obese Caucasian male and in no distress Mental status - normal mood, behavior, speech, dress, motor activity, and thought processes Chest - clear to auscultation, no wheezes, rales or rhonchi, symmetric air entry Heart - normal rate, regular rhythm, normal S1, S2, no murmurs, rubs, clicks or gallops Extremities -no lower extremity edema Diabetic Foot Exam - Simple   Simple Foot Form Visual Inspection See comments: Yes Sensation Testing See comments: Yes Pulse Check Posterior Tibialis and Dorsalis pulse intact bilaterally: Yes Comments -very large ulcer plantar surface right big toe.  Down to the fat layer.  No drainage noted.  Left big toe has been amputated.  3 cm also on the ball of the left foot.  It is not draining but it also goes through to the fat layer.      Depression screen [facility] 2/9 09/27/2020 03/26/2020 03/21/2020  Decreased Interest 1 3 0  Down, Depressed, Hopeless 1 2 0  PHQ - 2 Score 2 5 0  Altered sleeping 2 3 -  Tired, decreased energy 1 3 -  Change in appetite 0 1 -  Feeling bad or failure about yourself  1 1 -  Trouble concentrating 1 1 -  Moving slowly or  fidgety/restless 0 0 -  Suicidal thoughts 0 0 -  PHQ-9 Score 7 14 -  Difficult doing work/chores - - -  Some encounter information is confidential and restricted. Go to Review Flowsheets activity to see all data.  Some recent data might be hidden    CMP Latest Ref Rng & Units 08/09/2020 03/26/2020 03/08/2020  Glucose 70 - 99 mg/dL 243(H) 288(H) 185(H)  BUN 6 - 20 mg/dL '10 12 6  ' Creatinine 0.61 - 1.24 mg/dL 0.84 0.85 0.58(L)  Sodium 135 - 145 mmol/L 134(L) 139 136  Potassium 3.5 - 5.1 mmol/L 4.2 4.6 3.7  Chloride 98 - 111 mmol/L 99 98 102  CO2 22 - 32 mmol/L '24 26 23  ' Calcium 8.9 - 10.3 mg/dL 9.2 9.8 8.8(L)  Total Protein 6.0 - 8.5 g/dL - 7.9 -  Total Bilirubin 0.0 - 1.2 mg/dL - 0.2 -  Alkaline Phos 44 - 121 IU/L - 90 -  AST 0 - 40 IU/L - 15 -  ALT 0 - 44 IU/L - 21 -   Lipid Panel     Component Value Date/Time   CHOL 224 (H) 03/26/2020 1600   TRIG 282 (H) 03/26/2020 1600   HDL 33 (L) 03/26/2020 1600   CHOLHDL 6.8 (H) 03/26/2020 1600   LDLCALC 140 (H) 03/26/2020 1600    CBC    Component Value Date/Time   WBC 14.8 (H) 08/09/2020 0626   RBC 5.59 08/09/2020 0626   HGB 15.5 08/09/2020 0626   HGB 15.0 06/04/2016 1030   HCT 46.7 08/09/2020 0626   HCT 45.8 06/04/2016 1030   PLT 275 08/09/2020 0626   PLT 254 06/04/2016 1030   MCV 83.5 08/09/2020 0626   MCV 82.6 05/02/2018 1521   MCV 83 06/04/2016 1030   MCH 27.7 08/09/2020 0626   MCHC 33.2 08/09/2020 0626   RDW 13.3 08/09/2020 0626   RDW 14.1 06/04/2016 1030   LYMPHSABS 1.9 03/08/2020 0002   LYMPHSABS 3.3 (H) 06/04/2016 1030   MONOABS 0.7 03/08/2020 0002  EOSABS 0.1 03/08/2020 0002   EOSABS 0.1 06/04/2016 1030   BASOSABS 0.1 03/08/2020 0002   BASOSABS 0.0 06/04/2016 1030    ASSESSMENT AND PLAN: 1. Type 2 diabetes mellitus with morbid obesity ([place]) Improved but not at goal. Patient was not as compliant as he should have been with taking the metformin and insulin but has started doing so over the past 2 weeks so no  changes made in medications.  I also discussed with him the option of getting his insulin through our pharmacy where it may be cheaper for him. -I think he would be a good candidate for Trulicity but patient declined. Encouraged him to continue healthy eating habits. - POCT glucose (manual entry) - POCT glycosylated hemoglobin (Hb A1C)  2. Status post amputation of left great toe (HCC)   3. Diabetic ulcer of toe associated with diabetes mellitus due to underlying condition, with fat layer exposed, unspecified laterality [facility]) Being followed by orthopedics Dr. Sharol Given.  Plan for surgical procedure in the near future  4. Diabetic ulcer of other part of left foot associated with diabetes mellitus due to underlying condition, with fat layer exposed ([place]) This ulcer is smaller than the one on the right foot but still needs to be watched carefully.  I recommend referral to podiatry but patient is uninsured.  Encouraged him to apply for the orange card/cone discount as soon as possible then we can get him to podiatry or wound care.  5. Elevated blood pressure reading without diagnosis of hypertension DASH diet discussed and encouraged. Follow-up with clinical pharmacist in 1 month for repeat blood pressure check.  If still elevated we will start medication  6. Hyperlipidemia due to type 2 diabetes mellitus (HCC) - pravastatin (PRAVACHOL) 20 MG tablet; Take 1 tablet (20 mg total) by mouth daily.  Dispense: 30 tablet; Refill: 6    Patient was given the opportunity to ask questions.  Patient verbalized understanding of the plan and was able to repeat key elements of the plan.   Orders Placed This Encounter  Procedures  . POCT glucose (manual entry)  . POCT glycosylated hemoglobin (Hb A1C)     Requested Prescriptions   Signed Prescriptions Disp Refills  . pravastatin (PRAVACHOL) 20 MG tablet 30 tablet 6    Sig: Take 1 tablet (20 mg total) by mouth daily.  . insulin aspart protamine - aspart  (NOVOLOG 70/30 MIX) (70-30) 100 UNIT/ML FlexPen 15 mL 11    Sig: Inject 0.4 mLs (40 Units total) into the skin 2 (two) times daily.    Return in about 4 months (around 01/28/2021).  Karle Plumber, MD, FACP

## 2020-09-27 NOTE — Patient Instructions (Signed)
You should apply for the orange card/cone discount card.  Once approved we can refer you to the podiatrist. Try to take the insulin and metformin consistently as prescribed.  I have sent the prescription to our pharmacy for your insulin so that you can compare cost.

## 2020-09-30 ENCOUNTER — Other Ambulatory Visit: Payer: Self-pay

## 2020-10-07 ENCOUNTER — Other Ambulatory Visit: Payer: Self-pay

## 2020-10-09 ENCOUNTER — Ambulatory Visit (INDEPENDENT_AMBULATORY_CARE_PROVIDER_SITE_OTHER): Payer: No Payment, Other | Admitting: Licensed Clinical Social Worker

## 2020-10-09 ENCOUNTER — Other Ambulatory Visit: Payer: Self-pay

## 2020-10-09 DIAGNOSIS — F418 Other specified anxiety disorders: Secondary | ICD-10-CM

## 2020-10-11 ENCOUNTER — Other Ambulatory Visit: Payer: Self-pay

## 2020-10-11 MED FILL — Glucose Blood Test Strip: 30 days supply | Qty: 100 | Fill #0 | Status: AC

## 2020-10-15 NOTE — Progress Notes (Signed)
   THERAPIST PROGRESS NOTE  Session Time: 30 min  Participation Level: Active  Behavioral Response: CasualAlertAnxious  Type of Therapy: Individual Therapy  Treatment Goals addressed: Anxiety and Coping  Interventions: CBT and Supportive  Summary: [name] is a 40 y.o. male who presents with hx of dep/anx/ADHD. This date pt returns for in person session. He is noted to be more anxious as evidenced by bouncing his leg throughout session and fidgeting. Pt also noted to be quite profane with his comments today. Pt states his mood has been "middle of the road" without "extremes". He agrees he is more anxious today when asked. Assessment of source/s reveals more problems with niece/nephew. Pt states he has been "running around a lot" helping with his brother's children. Pt advises his brother is getting ready to file an emergency order for full custody with niece d/t bio mother's behaviors. He provides details. This date he also reports new concerns with mother of his nephew. LCSW provides active listening and assisted to process concerns/'worry. LCSW assessed for f/u on school. Pt denies any f/u and states if he goes any where it would be [facility]. Pt agrees to write out process steps r/t reconnecting with [facility] prior to next session. Pt states he continues to work on self talk. He states he is better at "nipping it" when he recognizes poor cognitive messaging. Pt denies other new worries/concerns. He is taking meds as prescribed. He denies knowing about surgery being rescheduled and states intent to f/u. LCSW reviewed poc including scheduling prior to close of session. Pt states appreciation for care.   Suicidal/Homicidal: Nowithout intent/plan  Therapist Response: Pt remains receptive to care.  Plan: Return again in 4 weeks.  Diagnosis: Axis I: Depression with anxiety  [place] Sink, LCSW 10/15/2020

## 2020-10-23 ENCOUNTER — Encounter (HOSPITAL_COMMUNITY): Payer: Self-pay | Admitting: Psychiatry

## 2020-10-23 ENCOUNTER — Telehealth (INDEPENDENT_AMBULATORY_CARE_PROVIDER_SITE_OTHER): Payer: No Payment, Other | Admitting: Psychiatry

## 2020-10-23 ENCOUNTER — Other Ambulatory Visit: Payer: Self-pay

## 2020-10-23 DIAGNOSIS — F321 Major depressive disorder, single episode, moderate: Secondary | ICD-10-CM

## 2020-10-23 DIAGNOSIS — F900 Attention-deficit hyperactivity disorder, predominantly inattentive type: Secondary | ICD-10-CM

## 2020-10-23 MED ORDER — ATOMOXETINE HCL 80 MG PO CAPS: 80.0000 mg | ORAL_CAPSULE | Freq: Every day | ORAL | 2 refills | Status: DC

## 2020-10-23 MED ORDER — SERTRALINE HCL 50 MG PO TABS: 50.0000 mg | ORAL_TABLET | Freq: Every day | ORAL | 2 refills | Status: DC

## 2020-10-23 NOTE — Progress Notes (Signed)
[place] MD/PA/NP OP Progress Note Virtual Visit via Video Note  I connected with [name] on 10/23/20 at 10:00 AM EDT by a video enabled telemedicine application and verified that I am speaking with the correct person using two identifiers.  Location: Patient: Home Provider: Clinic   I discussed the limitations of evaluation and management by telemedicine and the availability of in person appointments. The patient expressed understanding and agreed to proceed.  I provided 30 minutes of non-face-to-face time during this encounter.    10/23/2020 10:10 AM [name]  MRN:  [id number]  Chief Complaint: " The Christianne Borrow is hit or miss."  HPI: 40 year old male seen today for followup psychiatric evaluation. He has a psychiatric history of anxiety and depression.  He is currently managed on Zoloft 50 mg daily and Strattera 40 mg daily.  He notes his medications are somewhat effective in managing his psychiatric conditions.  Today he is well-groomed, pleasant,cooperative, engaged in conversation, maintained eye contact.  He informed provider that since his last visit he has been doing well.  He however notes that the Christianne Borrow is hit or miss.  He notes that sometimes it helps him focus and other times it does not.  He informed Probation officer that his anxiety and depression continues to be well managed on Zoloft.  Provider conducted a GAD-7 and patient scored a 10, at his last visit he scored a 12.  Provider also conducted a PHQ-9 and patient scored a 9, at his last visit he scored a 13.   Today he denies SI/HI/VAH, mania or paranoia.  Patient notes that his sleep fluctuates.  He notes at times he sleeps 4 hours and other times he sleeps 8 hours.  He endorses having a good appetite.  Today he is agreeable to increase Strattera 40 mg to 80 mg daily to help manage ADHD.  He will continue all other medications as prescribed and follow-up with outpatient counseling for therapy.  No other concerns  noted at this time.  Visit Diagnosis:    ICD-10-CM   1. Attention deficit hyperactivity disorder (ADHD), predominantly inattentive type  F90.0 atomoxetine (STRATTERA) 80 MG capsule  2. Moderate major depression, single episode (HCC)  F32.1 sertraline (ZOLOFT) 50 MG tablet    Past Psychiatric History: anxiety and depression  Past Medical History:  Past Medical History:  Diagnosis Date  . ADHD   . Anxiety   . Bipolar disorder ([place])   . Depression   . Diabetes mellitus without complication ([place])    type 2  . HLD (hyperlipidemia)   . Hypertension    no meds    Past Surgical History:  Procedure Laterality Date  . AMPUTATION Left 03/06/2020   Procedure: LEFT GREAT TOE AMPUTATION;  Surgeon: Newt Minion, MD;  Location: [place];  Service: Orthopedics;  Laterality: Left;  . EYE SURGERY Bilateral    lasik  . WISDOM TOOTH EXTRACTION      Family Psychiatric History: Mother depression, father depression, brother depression, maternal uncles alcohol dependence  Family History:  Family History  Problem Relation Age of Onset  . Diabetes Mother   . Hyperlipidemia Father   . Hypertension Father     Social History:  Social History   Socioeconomic History  . Marital status: Single    Spouse name: Not on file  . Number of children: Not on file  . Years of education: Not on file  . Highest education level: Not on file  Occupational History  . Occupation: Unemployed  Tobacco Use  . Smoking status: Never Smoker  . Smokeless tobacco: Never Used  Vaping Use  . Vaping Use: Never used  Substance and Sexual Activity  . Alcohol use: No    Alcohol/week: 0.0 standard drinks  . Drug use: No  . Sexual activity: Not on file  Other Topics Concern  . Not on file  Social History Narrative   Live with parents in [place].    Social Determinants of Health   Financial Resource Strain: Not on file  Food Insecurity: Not on file  Transportation Needs: Not on file  Physical Activity:  Not on file  Stress: Not on file  Social Connections: Not on file    Allergies:  Allergies  Allergen Reactions  . Milk-Related Compounds Diarrhea  . Iodine     Patient endorses unspecified reaction to iodine in past.. Just a one time thing.    Metabolic Disorder Labs: Lab Results  Component Value Date   HGBA1C 9.1 (A) 09/27/2020   MPG 291.96 03/03/2020   No results found for: PROLACTIN Lab Results  Component Value Date   CHOL 224 (H) 03/26/2020   TRIG 282 (H) 03/26/2020   HDL 33 (L) 03/26/2020   CHOLHDL 6.8 (H) 03/26/2020   LDLCALC 140 (H) 03/26/2020   No results found for: TSH  Therapeutic Level Labs: No results found for: LITHIUM No results found for: VALPROATE No components found for:  CBMZ  Current Medications: Current Outpatient Medications  Medication Sig Dispense Refill  . atomoxetine (STRATTERA) 80 MG capsule Take 1 capsule (80 mg total) by mouth daily. 30 capsule 2  . blood glucose meter kit and supplies Dispense based on patient and insurance preference. Use up to four times daily as directed. (FOR ICD-10 E10.9, E11.9). 1 each 0  . Blood Glucose Monitoring Suppl (TRUE METRIX METER) w/Device KIT USE AS DIRECTED 1 kit 0  . glucose blood test strip USE AS DIRECTED FOUR TIMES DAILY (Patient taking differently: 4 (four) times daily. as directed) 100 strip 0  . glucose blood test strip USE AS INSTRUCTED 100 strip 12  . insulin aspart protamine - aspart (NOVOLOG 70/30 MIX) (70-30) 100 UNIT/ML FlexPen Inject 0.4 mLs (40 Units total) into the skin 2 (two) times daily. 15 mL 11  . Insulin Pen Needle (PEN NEEDLES) 31G X 8 MM MISC UAD 100 each 6  . Insulin Pen Needle 31G X 8 MM MISC USE AS DIRECTED 100 each 6  . Insulin Pen Needle 32G X 4 MM MISC USE AS DIRECTED WITH INSULIN (Patient taking differently: See admin instructions. with insulin) 100 each 0  . metFORMIN (GLUCOPHAGE) 500 MG tablet Take 2 tablets (1,000 mg total) by mouth 2 (two) times daily with a meal. 120  tablet 2  . nystatin cream (MYCOSTATIN) Apply topically 2 (two) times daily. 30 g 0  . nystatin cream (MYCOSTATIN) APPLY TOPICALLY TWO TIMES DAILY (Patient not taking: Reported on 09/27/2020) 30 g 0  . pravastatin (PRAVACHOL) 20 MG tablet Take 1 tablet (20 mg total) by mouth daily. 30 tablet 6  . sertraline (ZOLOFT) 50 MG tablet Take 1 tablet (50 mg total) by mouth daily. 30 tablet 2   No current facility-administered medications for this visit.     Musculoskeletal: Strength & Muscle Tone: Unable to assess due to telehealth visit [place]: Unable to assess due to telehealth visit Patient leans: N/A  Psychiatric Specialty Exam: Review of Systems  There were no vitals taken for this visit.There is no height or  weight on file to calculate BMI.  General Appearance: Well Groomed  Eye Contact:  Good  Speech:  Clear and Coherent and Normal Rate  Volume:  Normal  Mood:  Euthymic  Affect:  Appropriate and Congruent  Thought Process:  Coherent, Goal Directed and Linear  Orientation:  Full (Time, Place, and Person)  Thought Content: WDL and Logical   Suicidal Thoughts:  No  Homicidal Thoughts:  No  Memory:  Immediate;   Good Recent;   Good Remote;   Good  Judgement:  Good  Insight:  Good  Psychomotor Activity:  Normal  Concentration:  Concentration: Good and Attention Span: Good  Recall:  Good  Fund of Knowledge: Good  Language: Good  Akathisia:  No  Handed:  Right  AIMS (if indicated): Not done  Assets:  Communication Skills Desire for Improvement Financial Resources/Insurance Housing Leisure Time Physical Health Social Support  ADL's:  Intact  Cognition: WNL  Sleep:  Fair   Screenings: GAD-7   Flowsheet Row Video Visit from 10/23/2020 in [facility] Office Visit from 09/27/2020 in [place] Video Visit from 07/30/2020 in [facility] Office Visit from 03/26/2020 in [place] Office Visit from 11/13/2017 in Primary Care at [facility]  Total GAD-7 Score '10 8 12 5 16    ' PHQ2-9   Flowsheet Row Video Visit from 10/23/2020 in [facility] Office Visit from 09/27/2020 in [place] Video Visit from 07/30/2020 in [facility] Office Visit from 03/26/2020 in [place] Office Visit from 03/21/2020 in [facility]  PHQ-2 Total Score '2 2 1 5 ' 0  PHQ-9 Total Score '9 7 13 14 ' --    Flowsheet Row Video Visit from 10/23/2020 in [facility] Admission (Discharged) from 08/09/2020 in [place] Video Visit from 07/30/2020 in [place] No Risk No Risk No Risk       Assessment and Plan: Patient endorses symptoms of ADHD however notes that his anxiety and depression are well managed on Zoloft.Today he is agreeable to increase Strattera 40 mg to 80 mg daily to help manage ADHD.  He will continue all other medications as prescribe.  1. Attention deficit hyperactivity disorder (ADHD), predominantly inattentive type  Increased- atomoxetine (STRATTERA) 80 MG capsule; Take 1 capsule (80 mg total) by mouth daily.  Dispense: 30 capsule; Refill: 2  2. Moderate major depression, single episode (HCC)  Continue- sertraline (ZOLOFT) 50 MG tablet; Take 1 tablet (50 mg total) by mouth daily.  Dispense: 30 tablet; Refill: 2  Follow-up in 3 Follow-up therapy  Salley Slaughter, NP 10/23/2020, 10:10 AM

## 2020-10-24 ENCOUNTER — Other Ambulatory Visit: Payer: Self-pay | Admitting: Internal Medicine

## 2020-10-24 DIAGNOSIS — E1169 Type 2 diabetes mellitus with other specified complication: Secondary | ICD-10-CM

## 2020-10-28 ENCOUNTER — Ambulatory Visit: Payer: Self-pay | Attending: Internal Medicine | Admitting: Pharmacist

## 2020-10-28 ENCOUNTER — Other Ambulatory Visit: Payer: Self-pay

## 2020-10-28 ENCOUNTER — Ambulatory Visit (HOSPITAL_COMMUNITY): Payer: No Payment, Other | Admitting: Licensed Clinical Social Worker

## 2020-10-28 ENCOUNTER — Encounter: Payer: Self-pay | Admitting: Pharmacist

## 2020-10-28 DIAGNOSIS — E785 Hyperlipidemia, unspecified: Secondary | ICD-10-CM

## 2020-10-28 DIAGNOSIS — E1165 Type 2 diabetes mellitus with hyperglycemia: Secondary | ICD-10-CM

## 2020-10-28 DIAGNOSIS — E1169 Type 2 diabetes mellitus with other specified complication: Secondary | ICD-10-CM

## 2020-10-28 LAB — GLUCOSE, POCT (MANUAL RESULT ENTRY): POC Glucose: 250 mg/dl — AB (ref 70–99)

## 2020-10-28 MED ORDER — TRULICITY 0.75 MG/0.5ML ~~LOC~~ SOAJ
0.7500 mg | SUBCUTANEOUS | 0 refills | Status: DC
  Filled 2020-10-28: qty 2, 28d supply, fill #0

## 2020-10-28 MED ORDER — PRAVASTATIN SODIUM 20 MG PO TABS
20.0000 mg | ORAL_TABLET | Freq: Every day | ORAL | 2 refills | Status: DC
  Filled 2020-10-28 – 2021-01-29 (×2): qty 30, 30d supply, fill #0

## 2020-10-28 MED ORDER — METFORMIN HCL 500 MG PO TABS
1000.0000 mg | ORAL_TABLET | Freq: Two times a day (BID) | ORAL | 2 refills | Status: DC
  Filled 2020-10-28 – 2020-12-06 (×2): qty 120, 30d supply, fill #0
  Filled 2021-01-22: qty 120, 30d supply, fill #1
  Filled 2021-03-10: qty 120, 30d supply, fill #2

## 2020-10-28 NOTE — Progress Notes (Signed)
S:    PCP: Dr. Laural Benes   No chief complaint on file.  Patient arrives in good spirits. Presents for diabetes evaluation, education, and management. Patient was referred and last seen by Primary Care Provider on 09/27/2020.    Patient reports Diabetes is longstanding. He is has been taking insulin for some time in addition to metformin. Cost has been an issue for him as he is self pay. He has a history of poorly controlled DM associated with DKA, diabetic foot ulcers. He denies hx of pancreatitis. No fhx or personal hx of thyroid cancer. No CAD/ACS, HF, or CKD hx noted.   Family/Social History:  -FHx: DM, HTN, HLD -Tobacco: never smoker -Alcohol: none  Insurance coverage/medication affordability: self pay  Medication adherence reported.   Current diabetes medications include: metformin 1000 mg BID (takes two 500mg  tablets BID), Novolog 70/30 40u BID Current hypertension medications include: none  Current hyperlipidemia medications include: pravastatin 20 mg daily   Patient denies hypoglycemic events.  Patient reported dietary habits: - Admits to dietary indiscretion   Patient-reported exercise habits: limited d/t foot ulcer that is being managed by Dr.    Patient denies nocturia (nighttime urination).  Patient denies neuropathy (nerve pain). Patient denies visual changes. Patient reports self foot exams.     O:  POCT glucose: 250   Lab Results  Component Value Date   HGBA1C 9.1 (A) 09/27/2020   Vitals:   10/28/20 1023  BP: 127/86   Lipid Panel     Component Value Date/Time   CHOL 224 (H) 03/26/2020 1600   TRIG 282 (H) 03/26/2020 1600   HDL 33 (L) 03/26/2020 1600   CHOLHDL 6.8 (H) 03/26/2020 1600   LDLCALC 140 (H) 03/26/2020 1600   Home fasting blood sugars: 140s-150s  2 hour post-meal/random blood sugars: not checking as much.  Clinical Atherosclerotic Cardiovascular Disease (ASCVD): No  The 10-year ASCVD risk score 13/06/2019 DC Jr., et al., 2013) is:  5.3%   Values used to calculate the score:     Age: 40 years     Sex: Male     Is Non-Hispanic African American: No     Diabetic: Yes     Tobacco smoker: No     Systolic Blood Pressure: 127 mmHg     Is BP treated: No     HDL Cholesterol: 33 mg/dL     Total Cholesterol: 224 mg/dL   A/P: Diabetes longstanding currently uncontrolled. Patient is able to verbalize appropriate hypoglycemia management plan. Medication adherence appears appropriate. Discussed with pt the option of adding Trulicity. He previously declined this option with Dr. 46 but tells me today this is d/t his cost concern. He will be able to obtain this medication via patient assistance with Laural Benes. He is amenable to starting therapy today.  -Started Trulicity 0.75 mg weekly.  -Patient was educated on the use of the Trulicity pen. Reviewed necessary supplies and operation of the pen.  -Continued current doses of metformin and insulin for now. I worry that his post-prandial levels are far from goal. However, we may need to decrease insulin in the future if needed to prevent hypoglycemia.  -Extensively discussed pathophysiology of diabetes, recommended lifestyle interventions, dietary effects on blood sugar control -Counseled on s/sx of and management of hypoglycemia -Next A1C anticipated 12/2020.  Hypertension longstanding currently close to goal without medications.  Blood pressure goal = 130/80 mmHg. He had a clean urine microablumin:SCr ratio in November. Will obtain a new value today. We may  need a low dose ACE/ARB in the future. -No medications today.   Written patient instructions provided. Total time in face to face counseling 30 minutes. Follow up Pharmacist Clinic Visit in 1 month.     Butch Penny, PharmD, [place]y [place], CPP Clinical Pharmacist [facility] [phone]

## 2020-10-29 LAB — CMP14+EGFR
ALT: 17 IU/L (ref 0–44)
AST: 14 IU/L (ref 0–40)
Albumin/Globulin Ratio: 1.4 (ref 1.2–2.2)
Albumin: 4.6 g/dL (ref 4.0–5.0)
Alkaline Phosphatase: 102 IU/L (ref 44–121)
BUN/Creatinine Ratio: 13 (ref 9–20)
BUN: 11 mg/dL (ref 6–24)
Bilirubin Total: 0.3 mg/dL (ref 0.0–1.2)
CO2: 23 mmol/L (ref 20–29)
Calcium: 9.6 mg/dL (ref 8.7–10.2)
Chloride: 99 mmol/L (ref 96–106)
Creatinine, Ser: 0.85 mg/dL (ref 0.76–1.27)
Globulin, Total: 3.3 g/dL (ref 1.5–4.5)
Glucose: 262 mg/dL — ABNORMAL HIGH (ref 65–99)
Potassium: 4.4 mmol/L (ref 3.5–5.2)
Sodium: 138 mmol/L (ref 134–144)
Total Protein: 7.9 g/dL (ref 6.0–8.5)
eGFR: 112 mL/min/{1.73_m2} (ref >59)

## 2020-10-29 LAB — MICROALBUMIN / CREATININE URINE RATIO
Creatinine, Urine: 250.7 mg/dL
Microalb/Creat Ratio: 20 mg/g creat (ref 0–29)
Microalbumin, Urine: 49.6 ug/mL

## 2020-10-29 LAB — LIPID PANEL
Chol/HDL Ratio: 5.6 ratio — ABNORMAL HIGH (ref 0.0–5.0)
Cholesterol, Total: 185 mg/dL (ref 100–199)
HDL: 33 mg/dL — ABNORMAL LOW (ref >39)
LDL Chol Calc (NIH): 122 mg/dL — ABNORMAL HIGH (ref 0–99)
Triglycerides: 166 mg/dL — ABNORMAL HIGH (ref 0–149)
VLDL Cholesterol Cal: 30 mg/dL (ref 5–40)

## 2020-11-11 ENCOUNTER — Other Ambulatory Visit: Payer: Self-pay

## 2020-11-12 ENCOUNTER — Other Ambulatory Visit: Payer: Self-pay

## 2020-11-27 ENCOUNTER — Ambulatory Visit: Payer: Self-pay | Attending: Internal Medicine | Admitting: Pharmacist

## 2020-11-27 ENCOUNTER — Other Ambulatory Visit: Payer: Self-pay

## 2020-11-27 ENCOUNTER — Encounter: Payer: Self-pay | Admitting: Pharmacist

## 2020-11-27 DIAGNOSIS — E1169 Type 2 diabetes mellitus with other specified complication: Secondary | ICD-10-CM

## 2020-11-27 LAB — GLUCOSE, POCT (MANUAL RESULT ENTRY): POC Glucose: 207 mg/dl — AB (ref 70–99)

## 2020-11-27 MED ORDER — INSULIN ASPART PROT & ASPART (70-30 MIX) 100 UNIT/ML PEN
36.0000 [IU] | PEN_INJECTOR | Freq: Two times a day (BID) | SUBCUTANEOUS | 11 refills | Status: DC
  Filled 2020-11-27 – 2020-12-24 (×2): qty 15, 21d supply, fill #0
  Filled 2021-01-22: qty 15, 21d supply, fill #1

## 2020-11-27 MED ORDER — TRULICITY 1.5 MG/0.5ML ~~LOC~~ SOAJ
1.5000 mg | SUBCUTANEOUS | 2 refills | Status: DC
  Filled 2020-11-27: qty 2, 28d supply, fill #0
  Filled 2020-12-24: qty 2, 28d supply, fill #1
  Filled 2021-01-22: qty 2, 28d supply, fill #2

## 2020-11-27 NOTE — Progress Notes (Signed)
    S:    PCP: Dr. Laural Benes   No chief complaint on file.  Patient arrives in good spirits. Presents for diabetes evaluation, education, and management. Patient was referred and last seen by Primary Care Provider on 09/27/2020.  I saw him on 10/28/2020 and started Trulicity.   Today, he reports tolerating the Trulicity well with good results. Does endorse some relative hypoglycemia for the first day or two after administration. Denies any NV, abdominal pain.   Family/Social History:  -FHx: DM, HTN, HLD -Tobacco: never smoker -Alcohol: none  Insurance coverage/medication affordability: self pay  Medication adherence reported.   Current diabetes medications include: metformin 1000 mg BID (takes two 500mg  tablets BID), Novolog 70/30 40u BID, Trulicity 0.75 mg weekly Current hypertension medications include: none  Current hyperlipidemia medications include: pravastatin 20 mg daily   Patient denies hypoglycemic events. Does have awareness as high was 120 range. Feels lightheaded/dizzy. Has occurred infrequently. He treats successfully.  Patient reported dietary habits: - Admits to dietary indiscretion   Patient-reported exercise habits: limited d/t foot ulcer that is being managed by Dr.    Patient denies nocturia (nighttime urination).  Patient denies neuropathy (nerve pain). Patient denies visual changes. Patient reports self foot exams.     O:  POCT glucose: 207 (admits to dietary indiscretion for the past two days).   Lab Results  Component Value Date   HGBA1C 9.1 (A) 09/27/2020   There were no vitals filed for this visit.  Lipid Panel     Component Value Date/Time   CHOL 185 10/28/2020 1032   TRIG 166 (H) 10/28/2020 1032   HDL 33 (L) 10/28/2020 1032   CHOLHDL 5.6 (H) 10/28/2020 1032   LDLCALC 122 (H) 10/28/2020 1032   No meter with him today.  Reported home blood sugars fasting and post-prandial: 120s-170s   Clinical Atherosclerotic Cardiovascular Disease  (ASCVD): No  The 10-year ASCVD risk score 12/28/2020 DC Jr., et al., 2013) is: 3.7%   Values used to calculate the score:     Age: 40 years     Sex: Male     Is Non-Hispanic African American: No     Diabetic: Yes     Tobacco smoker: No     Systolic Blood Pressure: 127 mmHg     Is BP treated: No     HDL Cholesterol: 33 mg/dL     Total Cholesterol: 185 mg/dL   A/P: Diabetes longstanding currently uncontrolled, however, reported CBGs at home are improving. Patient is able to verbalize appropriate hypoglycemia management plan. Medication adherence appears appropriate. Discussed with pt the option of increasing Trulicity. He is amenable to this. We will decrease his insulin. -Increase Trulicity to 1.5 mg weekly.  -Decrease Novolog 70/30 to 36 units BID -Continue same dose of metformin BID.  -Extensively discussed pathophysiology of diabetes, recommended lifestyle interventions, dietary effects on blood sugar control -Counseled on s/sx of and management of hypoglycemia -Next A1C anticipated 12/2020.  Written patient instructions provided. Total time in face to face counseling 30 minutes. Follow up Pharmacist Clinic Visit in 1 month.     01/2021, PharmD, Butch Penny, CPP Clinical Pharmacist [facility] [phone]

## 2020-12-06 ENCOUNTER — Other Ambulatory Visit: Payer: Self-pay

## 2020-12-09 ENCOUNTER — Ambulatory Visit (INDEPENDENT_AMBULATORY_CARE_PROVIDER_SITE_OTHER): Payer: No Payment, Other | Admitting: Licensed Clinical Social Worker

## 2020-12-09 ENCOUNTER — Other Ambulatory Visit: Payer: Self-pay

## 2020-12-09 DIAGNOSIS — F411 Generalized anxiety disorder: Secondary | ICD-10-CM | POA: Diagnosis not present

## 2020-12-10 ENCOUNTER — Ambulatory Visit (INDEPENDENT_AMBULATORY_CARE_PROVIDER_SITE_OTHER): Payer: Self-pay | Admitting: Family

## 2020-12-10 ENCOUNTER — Encounter: Payer: Self-pay | Admitting: Family

## 2020-12-10 DIAGNOSIS — L97511 Non-pressure chronic ulcer of other part of right foot limited to breakdown of skin: Secondary | ICD-10-CM

## 2020-12-10 DIAGNOSIS — Z89412 Acquired absence of left great toe: Secondary | ICD-10-CM

## 2020-12-10 DIAGNOSIS — L97521 Non-pressure chronic ulcer of other part of left foot limited to breakdown of skin: Secondary | ICD-10-CM

## 2020-12-10 NOTE — Progress Notes (Signed)
   THERAPIST PROGRESS NOTE  Session Time: 40 min  Participation Level: Active  Behavioral Response: CasualAlertAnxious  Type of Therapy: Individual Therapy  Treatment Goals addressed: Anxiety and Coping  Interventions: Solution Focused and Supportive  Summary: [name] is a 40 y.o. male who presents with hx of dep/anx. This date pt returns for in person session. He reports no significant changes since last session in May. Pt states he is not having S&S of dep yet states "My anxiety is behind everything". He is noted to be restless and bouncing his leg during session. Pt continues to spend a lot of time on electronic devices and is playing a lot of Sudoku per his report. He continues to assist with his nieces/nephews. Pt states his brother was awarded full custody of his dtr and this situation is "more stable". Pt did not write out process steps r/t getting back to school at [employer] but states he is still thinking of this. LCSW assessed for status of meds. Pt reports he is taking meds as prescribed. Reports he recently started Trulicity for his DM and is experiencing some weight loss. Exploration of DM management reveals pt saying he has a new foot wound in addition to old wound. He states Dr. Lajoyce Corners is not aware of new wound. When asked how long it has been since last appt he states ~ Feb. Talked about the seriousness of these wounds and past amputation of toe. Asked pt to call for appt during session, which he did. He will see PA tomorrow at 9:30am. He is encouraged to ask if he can go out walking while there. Pt denies other worries/concerns. LCSW reviewed poc including scheduling prior to close of session. Pt states appreciation for care.   Suicidal/Homicidal: Nowithout intent/plan  Therapist Response: Pt remains receptive to care.  Plan: Return again for next avail appt.  Diagnosis: Axis I: Generalized Anxiety Disorder    [place] S[place], LCSW 12/10/2020

## 2020-12-25 ENCOUNTER — Other Ambulatory Visit: Payer: Self-pay

## 2020-12-25 NOTE — Progress Notes (Signed)
Office Visit Note   Patient: [name]           Date of Birth: [birth year]           MRN: [id number] Visit Date: 12/10/2020              Requested by: Marcine Matar, MD [address] [place],  [place] [address] PCP: Marcine Matar, MD  Chief Complaint  Patient presents with   Right Foot - Follow-up   Left Foot - Open Wound      HPI: Patient is a 40 year old gentleman seen today for bilateral foot evaluation.  He has a new ulcer beneath his left foot.  He does have a history of chronic ulcer on the right beneath his great toe.  He is quite pleased with the improvement in his wound on the right.  States this has really been improving since he began taking Trulicity several months ago.  Assessment & Plan: Visit Diagnoses:  1. Right foot ulcer, limited to breakdown of skin (HCC)   2. Ulcer of left foot, limited to breakdown of skin (HCC)   3. Left great toe amputee (HCC)     Plan: Daily Dial soap cleansing bilateral feet.  Mupirocin dressing changes.  Offload the forefoot given new felt pressure relieving donuts for her shoe wear.  He will continue with his Trulicity.  Follow-up in the office in 2 weeks for reevaluation  Follow-Up Instructions: No follow-ups on file.   Ortho Exam  Patient is alert, oriented, no adenopathy, well-dressed, normal affect, normal respiratory effort.  On examination of the left foot there is new ulceration beneath the fourth metatarsal head on the left.  There is no erythema no purulence no odor no edema  On examination of the right foot beneath his great toe he has open ulceration this is dime sized today with about 5 mm of depth.  There is mild surrounding erythema there is no drainage no odor  These were part of nonviable tissue with a 10 blade knife after informed consent.  Patient tolerated well.  Imaging: No results found. No images are attached to the encounter.  Labs: Lab Results  Component Value Date   HGBA1C  9.1 (A) 09/27/2020   HGBA1C 11.8 (H) 03/03/2020   HGBA1C 11.4 (A) 05/02/2018   ESRSEDRATE 48 (H) 03/21/2020   ESRSEDRATE 81 (H) 03/05/2020   CRP 5.6 03/21/2020   CRP 9.0 (H) 03/05/2020   REPTSTATUS 03/09/2020 FINAL 03/04/2020   CULT  03/04/2020    NO GROWTH 5 DAYS Performed at [facility], [address] [place], [place] [address]    LABORGA GROUP B STREP(S.AGALACTIAE)ISOLATED 03/02/2020     Lab Results  Component Value Date   ALBUMIN 4.6 10/28/2020   ALBUMIN 4.3 03/26/2020   ALBUMIN 2.5 (L) 03/03/2020    No results found for: MG No results found for: VD25OH  No results found for: PREALBUMIN CBC EXTENDED Latest Ref Rng & Units 08/09/2020 03/08/2020 03/07/2020  WBC 4.0 - 10.5 K/uL 14.8(H) 6.8 7.4  RBC 4.22 - 5.81 MIL/uL 5.59 4.71 4.74  HGB 13.0 - 17.0 g/dL 60.7 12.9(L) 12.7(L)  HCT 39.0 - 52.0 % 46.7 39.7 39.6  PLT 150 - 400 K/uL 275 349 322  NEUTROABS 1.7 - 7.7 K/uL - 3.9 4.8  LYMPHSABS 0.7 - 4.0 K/uL - 1.9 1.8     There is no height or weight on file to calculate BMI.  Orders:  No orders of the defined  types were placed in this encounter.  No orders of the defined types were placed in this encounter.    Procedures: No procedures performed  Clinical Data: No additional findings.  ROS:  All other systems negative, except as noted in the HPI. Review of Systems  Objective: Vital Signs: There were no vitals taken for this visit.  Specialty Comments:  No specialty comments available.  PMFS History: Patient Active Problem List   Diagnosis Date Noted   Attention deficit hyperactivity disorder (ADHD), predominantly inattentive type 07/30/2020   Anxiety with depression 06/07/2020   Hyperlipidemia due to type 2 diabetes mellitus (HCC) 03/27/2020   Status post amputation of left great toe (HCC) 03/26/2020   Influenza vaccination declined 03/26/2020   23-polyvalent pneumococcal polysaccharide vaccine declined 03/26/2020   COVID-19 vaccine series  completed 03/26/2020   Moderate major depression, single episode (HCC) 03/26/2020   Anaerobic bacteremia 03/07/2020   Major depressive disorder, recurrent episode, moderate (HCC) 03/04/2020   Abscess of great toe, left    Sepsis due to group B Streptococcus with acute renal failure (HCC)    Cellulitis of left toe 03/03/2020   Diabetic ketoacidosis (HCC) 03/03/2020   DKA (diabetic ketoacidosis) (HCC) 03/03/2020   Ulcer of right foot with fat layer exposed (HCC) 06/06/2019   Non-pressure chronic ulcer of other part of left foot limited to breakdown of skin (HCC) 06/06/2019   Uncontrolled type 2 diabetes mellitus with hyperglycemia (HCC) 05/02/2018   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) 07/30/2017   Venous insufficiency of both lower extremities 07/30/2017   Obesity, Class III, BMI 40-49.9 (morbid obesity) (HCC) 07/30/2017   Essential hypertension 09/03/2016   Past Medical History:  Diagnosis Date   ADHD    Anxiety    Bipolar disorder (HCC)    Depression    Diabetes mellitus without complication (HCC)    type 2   HLD (hyperlipidemia)    Hypertension    no meds    Family History  Problem Relation Age of Onset   Diabetes Mother    Hyperlipidemia Father    Hypertension Father     Past Surgical History:  Procedure Laterality Date   AMPUTATION Left 03/06/2020   Procedure: LEFT GREAT TOE AMPUTATION;  Surgeon: [name], MD;  Location: [facility] OR;  Service: Orthopedics;  Laterality: Left;   EYE SURGERY Bilateral    lasik   WISDOM TOOTH EXTRACTION     Social History   Occupational History   Occupation: Unemployed  Tobacco Use   Smoking status: Never   Smokeless tobacco: Never  Vaping Use   Vaping Use: Never used  Substance and Sexual Activity   Alcohol use: No    Alcohol/week: 0.0 standard drinks   Drug use: No   Sexual activity: Not on file

## 2020-12-27 ENCOUNTER — Other Ambulatory Visit: Payer: Self-pay

## 2020-12-30 ENCOUNTER — Other Ambulatory Visit: Payer: Self-pay

## 2020-12-30 ENCOUNTER — Encounter: Payer: Self-pay | Admitting: Pharmacist

## 2020-12-30 ENCOUNTER — Ambulatory Visit: Payer: Self-pay | Attending: Internal Medicine | Admitting: Pharmacist

## 2020-12-30 DIAGNOSIS — E1169 Type 2 diabetes mellitus with other specified complication: Secondary | ICD-10-CM

## 2020-12-30 LAB — POCT GLYCOSYLATED HEMOGLOBIN (HGB A1C): Hemoglobin A1C: 7.7 % — AB (ref 4.0–5.6)

## 2020-12-30 NOTE — Progress Notes (Signed)
    S:    PCP: Dr. Laural Benes   No chief complaint on file.  Patient arrives in good spirits. Presents for diabetes evaluation, education, and management. Patient was referred and last seen by Primary Care Provider on 09/27/2020.  I saw him on 11/27/2020 and increased his Trulicity dose.   Today, he reports tolerating the increased Trulicity dose well with good results. Denies any NV, abdominal pain. Reports improved wound healing in regards to the ulcer on his right foot. Was seen by Ortho on 12/10/2020. Denies any changes to his vision.   Family/Social History:  -FHx: DM, HTN, HLD -Tobacco: never smoker -Alcohol: none  Insurance coverage/medication affordability: self pay  Medication adherence reported.   Current diabetes medications include: metformin 1000 mg BID (takes two 500mg  tablets BID), Novolog 70/30 36u BID (pt is taking 35u BID), Trulicity 1.5 mg weekly Current hypertension medications include: none  Current hyperlipidemia medications include: pravastatin 20 mg daily   Patient denies hypoglycemic events.   Patient reported dietary habits: - Admits to dietary indiscretion   Patient-reported exercise habits: limited d/t foot ulcer    Patient denies nocturia (nighttime urination).  Patient denies neuropathy (nerve pain). Patient denies visual changes. Patient reports self foot exams.     O:  Lab Results  Component Value Date   HGBA1C 7.7 (A) 12/30/2020   There were no vitals filed for this visit.  Lipid Panel     Component Value Date/Time   CHOL 185 10/28/2020 1032   TRIG 166 (H) 10/28/2020 1032   HDL 33 (L) 10/28/2020 1032   CHOLHDL 5.6 (H) 10/28/2020 1032   LDLCALC 122 (H) 10/28/2020 1032   No meter with him today.  Reported home blood sugars fasting and post-prandial: mostly in the 130 range. Occasionally sees numbers in the 200s.   Clinical Atherosclerotic Cardiovascular Disease (ASCVD): No  The 10-year ASCVD risk score 12/28/2020 DC Jr., et al., 2013) is:  3.7%   Values used to calculate the score:     Age: 40 years     Sex: Male     Is Non-Hispanic African American: No     Diabetic: Yes     Tobacco smoker: No     Systolic Blood Pressure: 127 mmHg     Is BP treated: No     HDL Cholesterol: 33 mg/dL     Total Cholesterol: 185 mg/dL   A/P: Diabetes longstanding currently above goal, however, A1c today is down to 7.7 from 9.1%. Patient is able to verbalize appropriate hypoglycemia management plan. Medication adherence appears appropriate. Discussed with pt the option of increasing Trulicity.  -Continue Trulicity 1.5 mg weekly.  -Increase Novolog 70/30 to 37 units BID -Continue same dose of metformin BID.  -Extensively discussed pathophysiology of diabetes, recommended lifestyle interventions, dietary effects on blood sugar control -Counseled on s/sx of and management of hypoglycemia -POCT A1c  Written patient instructions provided. Total time in face to face counseling 30 minutes. Follow up PCP Clinic Visit in 1 month.     44, PharmD, Butch Penny, CPP Clinical Pharmacist [facility] [phone]

## 2021-01-07 ENCOUNTER — Ambulatory Visit (INDEPENDENT_AMBULATORY_CARE_PROVIDER_SITE_OTHER): Payer: Self-pay | Admitting: Family

## 2021-01-07 ENCOUNTER — Encounter: Payer: Self-pay | Admitting: Family

## 2021-01-07 ENCOUNTER — Other Ambulatory Visit: Payer: Self-pay

## 2021-01-07 DIAGNOSIS — L97521 Non-pressure chronic ulcer of other part of left foot limited to breakdown of skin: Secondary | ICD-10-CM

## 2021-01-07 DIAGNOSIS — L97511 Non-pressure chronic ulcer of other part of right foot limited to breakdown of skin: Secondary | ICD-10-CM

## 2021-01-07 NOTE — Progress Notes (Signed)
Office Visit Note   Patient: [name]           Date of Birth: 1980/09/16           MRN: [id number] Visit Date: 01/07/2021              Requested by: Marcine Matar, MD [address] [place],  [place] [address] PCP: Marcine Matar, MD  Chief Complaint  Patient presents with   Right Foot - Follow-up      HPI: Patient is a 40 year old gentleman seen today for bilateral foot evaluation.  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  He is diabetic has recently changed medication which has been controlling his blood sugars better  He is full weightbearing in regular shoewear doing mupirocin dressing changes to bilateral wounds.    Assessment & Plan: Visit Diagnoses:  1. Non-pressure chronic ulcer of other part of left foot limited to breakdown of skin (HCC)   2. Ulcer of toe of right foot, limited to breakdown of skin (HCC)     Plan: Daily Dial soap cleansing bilateral feet.  Will trial Prisma dressings bilaterally. offload the forefoot, given new felt pressure relieving donuts for her shoe wear.  He will continue with his Trulicity.  Follow-up in the office in 4 weeks for reevaluation  Follow-Up Instructions: Return in about 4 weeks (around 02/04/2021).   Ortho Exam  Patient is alert, oriented, no adenopathy, well-dressed, normal affect, normal respiratory effort.  On examination of the left foot there is ulceration beneath the fourth metatarsal head on the left, this is now 1-1/2 cm x 1 cm with 2 mm in depth filled in with bleeding beefy granulation..  There is no erythema no purulence no odor no edema.  Was debrided of surrounding nonviable callus tissue  On examination of the right foot beneath his great toe he has open ulceration this is 2 cm x 15 mm with 3 mm of depth filled in with 90% granulation.  There is scant necrotic tissue in the base there is no surrounding erythema no odor or sign of infection  These were part of nonviable tissue with a 10  blade knife after informed consent.  Patient tolerated well.  Imaging: No results found. No images are attached to the encounter.  Labs: Lab Results  Component Value Date   HGBA1C 7.7 (A) 12/30/2020   HGBA1C 9.1 (A) 09/27/2020   HGBA1C 11.8 (H) 03/03/2020   ESRSEDRATE 48 (H) 03/21/2020   ESRSEDRATE 81 (H) 03/05/2020   CRP 5.6 03/21/2020   CRP 9.0 (H) 03/05/2020   REPTSTATUS 03/09/2020 FINAL 03/04/2020   CULT  03/04/2020    NO GROWTH 5 DAYS Performed at [facility], [address], [place], [place] [address]    LABORGA GROUP B STREP(S.AGALACTIAE)ISOLATED 03/02/2020     Lab Results  Component Value Date   ALBUMIN 4.6 10/28/2020   ALBUMIN 4.3 03/26/2020   ALBUMIN 2.5 (L) 03/03/2020    No results found for: MG No results found for: VD25OH  No results found for: PREALBUMIN CBC EXTENDED Latest Ref Rng & Units 08/09/2020 03/08/2020 03/07/2020  WBC 4.0 - 10.5 K/uL 14.8(H) 6.8 7.4  RBC 4.22 - 5.81 MIL/uL 5.59 4.71 4.74  HGB 13.0 - 17.0 g/dL 24.0 12.9(L) 12.7(L)  HCT 39.0 - 52.0 % 46.7 39.7 39.6  PLT 150 - 400 K/uL 275 349 322  NEUTROABS 1.7 - 7.7 K/uL - 3.9 4.8  LYMPHSABS 0.7 - 4.0 K/uL - 1.9 1.8  There is no height or weight on file to calculate BMI.  Orders:  No orders of the defined types were placed in this encounter.  No orders of the defined types were placed in this encounter.    Procedures: No procedures performed  Clinical Data: No additional findings.  ROS:  All other systems negative, except as noted in the HPI. Review of Systems  Objective: Vital Signs: There were no vitals taken for this visit.  Specialty Comments:  No specialty comments available.  PMFS History: Patient Active Problem List   Diagnosis Date Noted   Attention deficit hyperactivity disorder (ADHD), predominantly inattentive type 07/30/2020   Anxiety with depression 06/07/2020   Hyperlipidemia due to type 2 diabetes mellitus (HCC) 03/27/2020   Status post  amputation of left great toe (HCC) 03/26/2020   Influenza vaccination declined 03/26/2020   23-polyvalent pneumococcal polysaccharide vaccine declined 03/26/2020   COVID-19 vaccine series completed 03/26/2020   Moderate major depression, single episode (HCC) 03/26/2020   Anaerobic bacteremia 03/07/2020   Major depressive disorder, recurrent episode, moderate (HCC) 03/04/2020   Abscess of great toe, left    Sepsis due to group B Streptococcus with acute renal failure (HCC)    Cellulitis of left toe 03/03/2020   Diabetic ketoacidosis (HCC) 03/03/2020   DKA (diabetic ketoacidosis) (HCC) 03/03/2020   Ulcer of right foot with fat layer exposed (HCC) 06/06/2019   Non-pressure chronic ulcer of other part of left foot limited to breakdown of skin (HCC) 06/06/2019   Uncontrolled type 2 diabetes mellitus with hyperglycemia (HCC) 05/02/2018   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) 07/30/2017   Venous insufficiency of both lower extremities 07/30/2017   Obesity, Class III, BMI 40-49.9 (morbid obesity) (HCC) 07/30/2017   Essential hypertension 09/03/2016   Past Medical History:  Diagnosis Date   ADHD    Anxiety    Bipolar disorder (HCC)    Depression    Diabetes mellitus without complication (HCC)    type 2   HLD (hyperlipidemia)    Hypertension    no meds    Family History  Problem Relation Age of Onset   Diabetes Mother    Hyperlipidemia Father    Hypertension Father     Past Surgical History:  Procedure Laterality Date   AMPUTATION Left 03/06/2020   Procedure: LEFT GREAT TOE AMPUTATION;  Surgeon: [name], MD;  Location: [facility] OR;  Service: Orthopedics;  Laterality: Left;   EYE SURGERY Bilateral    lasik   WISDOM TOOTH EXTRACTION     Social History   Occupational History   Occupation: Unemployed  Tobacco Use   Smoking status: Never   Smokeless tobacco: Never  Vaping Use   Vaping Use: Never used  Substance and Sexual Activity   Alcohol use: No    Alcohol/week: 0.0  standard drinks   Drug use: No   Sexual activity: Not on file

## 2021-01-08 ENCOUNTER — Ambulatory Visit (INDEPENDENT_AMBULATORY_CARE_PROVIDER_SITE_OTHER): Payer: No Payment, Other | Admitting: Licensed Clinical Social Worker

## 2021-01-08 DIAGNOSIS — F411 Generalized anxiety disorder: Secondary | ICD-10-CM

## 2021-01-10 NOTE — Progress Notes (Signed)
   THERAPIST PROGRESS NOTE  Session Time: 35 min  Participation Level: Active  Behavioral Response: CasualAlertmildly anx  Type of Therapy: Individual Therapy  Treatment Goals addressed: Communication: anx/dep/coping  Interventions: Motivational Interviewing and Supportive  Summary: [name] is a 40 y.o. male who presents with hx of anx/dep/ADHD. This date pt returns for in person session. Pt states he is doing "not bad" when asked. LCSW assessed for pt follow through with appt for foot wound care. Pt states he did go to appt and has been back several times for tx. Pt states they are trying a new tx and it seems to be working well, wounds improving. Pt failed to ask if he could engage in regular walking for exercise given his bilateral foot wounds. Pt states he would not be able to walk outside recently anyway d/t heat. LCSW assessed for stationary bike, which pt reports he does not have but could look for one. Reviewed benefits of regular exercise. Pt states he is maintaining care of DM. Pt feels he is fairly well managed at present with mood. Pt reports his anx is "1" and dep "2" on a 0-10 scale with 10 the worst. Pt denies any significant changes. His nieces/nephews will be returning to school soon and he anticipates resumption of helping with transportation to and from school. He advises worrisome fam dynamics with brother/kids has settled. Pt reports he is trying to stay positive with self talk and take one day at a time. LCSW advised of need to repeat CCA in coming appt. Pt states his biggest goal in therapy is to help him "keep myself in structure". When asked his typical day pt states he gets up between 7-9, takes meds, reads, talks to friends online, does any needed chores, cares for dogs, returns to online with friends and watches youtube. Reports he is sleeping adequately. Pt denies any hobbies and has not followed up on school. LCSW reviewed poc including scheduling prior to  close of session. Pt states appreciation for care.     Suicidal/Homicidal: Nowithout intent/plan  Therapist Response: Pt remains receptive to care.  Plan: Return again in 4 weeks.  Diagnosis: Axis I: Generalized Anxiety Disorder     [place], LCSW 01/10/2021

## 2021-01-22 ENCOUNTER — Encounter (HOSPITAL_COMMUNITY): Payer: Self-pay | Admitting: Psychiatry

## 2021-01-22 ENCOUNTER — Telehealth (INDEPENDENT_AMBULATORY_CARE_PROVIDER_SITE_OTHER): Payer: No Payment, Other | Admitting: Psychiatry

## 2021-01-22 ENCOUNTER — Other Ambulatory Visit: Payer: Self-pay

## 2021-01-22 DIAGNOSIS — F321 Major depressive disorder, single episode, moderate: Secondary | ICD-10-CM

## 2021-01-22 DIAGNOSIS — F900 Attention-deficit hyperactivity disorder, predominantly inattentive type: Secondary | ICD-10-CM | POA: Diagnosis not present

## 2021-01-22 MED ORDER — ATOMOXETINE HCL 80 MG PO CAPS: 80.0000 mg | ORAL_CAPSULE | Freq: Every day | ORAL | 3 refills | Status: DC

## 2021-01-22 MED ORDER — SERTRALINE HCL 50 MG PO TABS: 50.0000 mg | ORAL_TABLET | Freq: Every day | ORAL | 3 refills | Status: DC

## 2021-01-22 NOTE — Progress Notes (Signed)
[place] MD/PA/NP OP Progress Note Virtual Visit via Video Note  I connected with [name] on 01/22/21 at  9:30 AM EDT by a video enabled telemedicine application and verified that I am speaking with the correct person using two identifiers.  Location: Patient: Home Provider: Clinic   I discussed the limitations of evaluation and management by telemedicine and the availability of in person appointments. The patient expressed understanding and agreed to proceed.  I provided 30 minutes of non-face-to-face time during this encounter.    01/22/2021 9:49 AM [name]  MRN:  [id number]  Chief Complaint: "I have been doing fine"  HPI: 40 year old male seen today for followup psychiatric evaluation. He has a psychiatric history of anxiety and depression.  He is currently managed on Zoloft 50 mg daily and Strattera 80 mg daily.  He notes his medications are effective in managing his psychiatric conditions.   Today he is well-groomed, pleasant,cooperative, engaged in conversation, maintained eye contact.  He informed provider that since his last visit he has been doing fine.  He notes that his anxiety and depression has improved.  Provider conducted a GAD-7 and patient scored a 7, at his last visit he scored a 10.  Provider also conducted a PHQ-9 and patient scored 8, at last visit he scored a 9.  He notes that since increasing Strattera his concentration to has improved.  He endorses adequate sleep and appetite.  He informed Probation officer that he lost 5 pounds since his last visit.   Today he denies SI/HI/VAH, mania or paranoia.     No medication changes made today.  Patient will continue all medications as prescribed and follow-up with outpatient counseling for therapy.  No other concerns noted at this time.   Visit Diagnosis:    ICD-10-CM   1. Attention deficit hyperactivity disorder (ADHD), predominantly inattentive type  F90.0 atomoxetine (STRATTERA) 80 MG capsule    2. Moderate major  depression, single episode (HCC)  F32.1 sertraline (ZOLOFT) 50 MG tablet      Past Psychiatric History: anxiety and depression  Past Medical History:  Past Medical History:  Diagnosis Date   ADHD    Anxiety    Bipolar disorder ([place])    Depression    Diabetes mellitus without complication ([place])    type 2   HLD (hyperlipidemia)    Hypertension    no meds    Past Surgical History:  Procedure Laterality Date   AMPUTATION Left 03/06/2020   Procedure: LEFT GREAT TOE AMPUTATION;  Surgeon: Newt Minion, MD;  Location: [place];  Service: Orthopedics;  Laterality: Left;   EYE SURGERY Bilateral    lasik   WISDOM TOOTH EXTRACTION      Family Psychiatric History: Mother depression, father depression, brother depression, maternal uncles alcohol dependence  Family History:  Family History  Problem Relation Age of Onset   Diabetes Mother    Hyperlipidemia Father    Hypertension Father     Social History:  Social History   Socioeconomic History   Marital status: Single    Spouse name: Not on file   Number of children: Not on file   Years of education: Not on file   Highest education level: Not on file  Occupational History   Occupation: Unemployed  Tobacco Use   Smoking status: Never   Smokeless tobacco: Never  Vaping Use   Vaping Use: Never used  Substance and Sexual Activity   Alcohol use: No    Alcohol/week: 0.0 standard drinks  Drug use: No   Sexual activity: Not on file  Other Topics Concern   Not on file  Social History Narrative   Live with parents in [place].    Social Determinants of Health   Financial Resource Strain: Not on file  Food Insecurity: Not on file  Transportation Needs: Not on file  Physical Activity: Not on file  Stress: Not on file  Social Connections: Not on file    Allergies:  Allergies  Allergen Reactions   Milk-Related Compounds Diarrhea   Iodine     Patient endorses unspecified reaction to iodine in past.. Just a one time  thing.    Metabolic Disorder Labs: Lab Results  Component Value Date   HGBA1C 7.7 (A) 12/30/2020   MPG 291.96 03/03/2020   No results found for: PROLACTIN Lab Results  Component Value Date   CHOL 185 10/28/2020   TRIG 166 (H) 10/28/2020   HDL 33 (L) 10/28/2020   CHOLHDL 5.6 (H) 10/28/2020   LDLCALC 122 (H) 10/28/2020   LDLCALC 140 (H) 03/26/2020   No results found for: TSH  Therapeutic Level Labs: No results found for: LITHIUM No results found for: VALPROATE No components found for:  CBMZ  Current Medications: Current Outpatient Medications  Medication Sig Dispense Refill   atomoxetine (STRATTERA) 80 MG capsule Take 1 capsule (80 mg total) by mouth daily. 30 capsule 3   blood glucose meter kit and supplies Dispense based on patient and insurance preference. Use up to four times daily as directed. (FOR ICD-10 E10.9, E11.9). 1 each 0   Blood Glucose Monitoring Suppl (TRUE METRIX METER) w/Device KIT USE AS DIRECTED 1 kit 0   Dulaglutide (TRULICITY) 1.5 DU/2.0UR SOPN Inject 1.5 mg into the skin once a week. 2 mL 2   glucose blood test strip USE AS DIRECTED FOUR TIMES DAILY (Patient taking differently: 4 (four) times daily. as directed) 100 strip 0   glucose blood test strip USE AS INSTRUCTED 100 strip 12   insulin aspart protamine - aspart (NOVOLOG 70/30 MIX) (70-30) 100 UNIT/ML FlexPen Inject 0.36 mLs (36 Units total) into the skin 2 (two) times daily. 15 mL 11   Insulin Pen Needle (PEN NEEDLES) 31G X 8 MM MISC UAD 100 each 6   Insulin Pen Needle 31G X 8 MM MISC USE AS DIRECTED 100 each 6   Insulin Pen Needle 32G X 4 MM MISC USE AS DIRECTED WITH INSULIN (Patient taking differently: See admin instructions. with insulin) 100 each 0   metFORMIN (GLUCOPHAGE) 500 MG tablet Take 2 tablets (1,000 mg total) by mouth 2 (two) times daily with a meal. 120 tablet 2   nystatin cream (MYCOSTATIN) Apply topically 2 (two) times daily. 30 g 0   nystatin cream (MYCOSTATIN) APPLY TOPICALLY TWO  TIMES DAILY (Patient not taking: Reported on 09/27/2020) 30 g 0   pravastatin (PRAVACHOL) 20 MG tablet Take 1 tablet (20 mg total) by mouth daily. 30 tablet 2   sertraline (ZOLOFT) 50 MG tablet Take 1 tablet (50 mg total) by mouth daily. 30 tablet 3   No current facility-administered medications for this visit.     Musculoskeletal: Strength & Muscle Tone:  Unable to assess due to telehealth visit [place]:  Unable to assess due to telehealth visit Patient leans: N/A  Psychiatric Specialty Exam: Review of Systems  There were no vitals taken for this visit.There is no height or weight on file to calculate BMI.  General Appearance: Well Groomed  Eye Contact:  Good  Speech:  Clear and Coherent and Normal Rate  Volume:  Normal  Mood:  Euthymic  Affect:  Appropriate and Congruent  Thought Process:  Coherent, Goal Directed and Linear  Orientation:  Full (Time, Place, and Person)  Thought Content: WDL and Logical   Suicidal Thoughts:  No  Homicidal Thoughts:  No  Memory:  Immediate;   Good Recent;   Good Remote;   Good  Judgement:  Good  Insight:  Good  Psychomotor Activity:  Normal  Concentration:  Concentration: Good and Attention Span: Good  Recall:  Good  Fund of Knowledge: Good  Language: Good  Akathisia:  No  Handed:  Right  AIMS (if indicated): Not done  Assets:  Communication Skills Desire for Improvement Financial Resources/Insurance Housing Leisure Time Physical Health Social Support  ADL's:  Intact  Cognition: WNL  Sleep:  Good   Screenings: GAD-7    Flowsheet Row Video Visit from 01/22/2021 in [facility] Video Visit from 10/23/2020 in [facility] Office Visit from 09/27/2020 in [place] Video Visit from 07/30/2020 in [facility] Office Visit from 03/26/2020 in [place]  Total GAD-7 Score '7 10 8 12 5       ' PHQ2-9    Flowsheet Row Video Visit from 01/22/2021 in [facility] Video Visit from 10/23/2020 in [facility] Office Visit from 09/27/2020 in [place] Video Visit from 07/30/2020 in [facility] Office Visit from 03/26/2020 in [place]  PHQ-2 Total Score '1 2 2 1 5  ' PHQ-9 Total Score '8 9 7 13 14      ' Flowsheet Row Video Visit from 10/23/2020 in [facility] Admission (Discharged) from 08/09/2020 in [place] Video Visit from 07/30/2020 in [place] No Risk No Risk No Risk        Assessment and Plan: Patient notes his anxiety, depression, and ADHD has improved since his last visit. No medication changes made today.  Patient will continue all medications as prescribed.  1. Attention deficit hyperactivity disorder (ADHD), predominantly inattentive type  Continue- atomoxetine (STRATTERA) 80 MG capsule; Take 1 capsule (80 mg total) by mouth daily.  Dispense: 30 capsule; Refill: 2  2. Moderate major depression, single episode (HCC)  Continue- sertraline (ZOLOFT) 50 MG tablet; Take 1 tablet (50 mg total) by mouth daily.  Dispense: 30 tablet; Refill: 2  Follow-up in 3 Follow-up therapy  Salley Slaughter, NP 01/22/2021, 9:49 AM

## 2021-01-23 ENCOUNTER — Other Ambulatory Visit: Payer: Self-pay

## 2021-01-29 ENCOUNTER — Other Ambulatory Visit: Payer: Self-pay

## 2021-01-30 ENCOUNTER — Ambulatory Visit: Payer: Self-pay | Attending: Internal Medicine | Admitting: Internal Medicine

## 2021-01-30 ENCOUNTER — Other Ambulatory Visit: Payer: Self-pay

## 2021-01-30 ENCOUNTER — Encounter: Payer: Self-pay | Admitting: Internal Medicine

## 2021-01-30 DIAGNOSIS — L97512 Non-pressure chronic ulcer of other part of right foot with fat layer exposed: Secondary | ICD-10-CM

## 2021-01-30 DIAGNOSIS — E785 Hyperlipidemia, unspecified: Secondary | ICD-10-CM

## 2021-01-30 DIAGNOSIS — Z2821 Immunization not carried out because of patient refusal: Secondary | ICD-10-CM

## 2021-01-30 DIAGNOSIS — E1169 Type 2 diabetes mellitus with other specified complication: Secondary | ICD-10-CM

## 2021-01-30 DIAGNOSIS — F321 Major depressive disorder, single episode, moderate: Secondary | ICD-10-CM

## 2021-01-30 LAB — GLUCOSE, POCT (MANUAL RESULT ENTRY): POC Glucose: 206 mg/dl — AB (ref 70–99)

## 2021-01-30 MED ORDER — PRAVASTATIN SODIUM 40 MG PO TABS
40.0000 mg | ORAL_TABLET | Freq: Every day | ORAL | 6 refills | Status: DC
  Filled 2021-01-30: qty 30, 30d supply, fill #0
  Filled 2021-03-10: qty 30, 30d supply, fill #1
  Filled 2021-04-14: qty 30, 30d supply, fill #2
  Filled 2021-05-29: qty 30, 30d supply, fill #0
  Filled 2021-07-23: qty 30, 30d supply, fill #1
  Filled 2021-08-27: qty 30, 30d supply, fill #2
  Filled 2021-09-24: qty 30, 30d supply, fill #3
  Filled ????-??-??: fill #3

## 2021-01-30 MED ORDER — INSULIN ASPART PROT & ASPART (70-30 MIX) 100 UNIT/ML PEN
39.0000 [IU] | PEN_INJECTOR | Freq: Two times a day (BID) | SUBCUTANEOUS | 11 refills | Status: DC
  Filled 2021-01-30 – 2021-03-10 (×2): qty 15, 19d supply, fill #0
  Filled 2021-04-14: qty 15, 19d supply, fill #1
  Filled 2021-05-29: qty 15, 19d supply, fill #0
  Filled ????-??-??: fill #2

## 2021-01-30 NOTE — Patient Instructions (Signed)
Your cholesterol is not at goal.  Increase the Pravachol to 40 mg daily as discussed. Increase the insulin to 39 units twice a day.  Try to check your blood sugars at least once a day alternating before breakfast and before dinner.  Goals for blood sugars before meals is 90-130.

## 2021-01-30 NOTE — Progress Notes (Signed)
Patient ID: DAX MURGUIA, male    DOB: 1980-06-18  MRN: [id number]  CC: Diabetes   Subjective: [name] is a 40 y.o. male who presents for chronic ds management His concerns today include:  Pt with hx of DM type 2, HL, HTN (off meds x 15 yrs) obesity, DM foot ulcers, amputation LT big toe (02/2020), MDD, obesity,  DM: Results for orders placed or performed in visit on 01/30/21  POCT glucose (manual entry)  Result Value Ref Range   POC Glucose 206 (A) 70 - 99 mg/dl   Lab Results  Component Value Date   HGBA1C 7.7 (A) 12/30/2020  -Since last visit with me, he has seen the clinical pharmacist and was started on Trulicity.  Tolerating Trulicity. Taking Novolog mix 37 units BID and metformin 500 mg twice a day.  His A1c had improved from 9.1 to 7.7. -checks BS "every no and then."  BS was 206 around 5 a.m this morning.   Not much exercise.  Still working on getting big toe RT foot healed.  Will see Dr. Sharol Given again next wk.  -Feels eating better since Trulicity.  Eating less.  HL: His LDL on last visit with me was 122 with goal being less than 70.  Patient reports he has been taking and still is taking Pravachol 20 mg daily consistently.  Followed by [facility] for ADHD and MDD.  He is on Strattera and Zoloft.  He feels he is doing well on these medications  HM: declines flu shot.  Had 2 COVID vaccine.  Overdue for eye exam.  No insurance.   Patient Active Problem List   Diagnosis Date Noted   Attention deficit hyperactivity disorder (ADHD), predominantly inattentive type 07/30/2020   Anxiety with depression 06/07/2020   Hyperlipidemia due to type 2 diabetes mellitus ([place]) 03/27/2020   Status post amputation of left great toe ([place]) 03/26/2020   Influenza vaccination declined 03/26/2020   23-polyvalent pneumococcal polysaccharide vaccine declined 03/26/2020   COVID-19 vaccine series completed 03/26/2020   Moderate major depression, single episode ([place]) 03/26/2020    Anaerobic bacteremia 03/07/2020   Major depressive disorder, recurrent episode, moderate ([place]) 03/04/2020   Abscess of great toe, left    Sepsis due to group B Streptococcus with acute renal failure (HCC)    Cellulitis of left toe 03/03/2020   Diabetic ketoacidosis ([place]) 03/03/2020   DKA (diabetic ketoacidosis) ([place]) 03/03/2020   Ulcer of right foot with fat layer exposed ([place]) 06/06/2019   Non-pressure chronic ulcer of other part of left foot limited to breakdown of skin ([place]) 06/06/2019   Uncontrolled type 2 diabetes mellitus with hyperglycemia ([place]) 05/02/2018   Diabetic ulcer of left great toe ([place]) 07/30/2017   Venous insufficiency of both lower extremities 07/30/2017   Obesity, Class III, BMI 40-49.9 (morbid obesity) ([place]) 07/30/2017   Essential hypertension 09/03/2016     Current Outpatient Medications on File Prior to Visit  Medication Sig Dispense Refill   atomoxetine (STRATTERA) 80 MG capsule Take 1 capsule (80 mg total) by mouth daily. 30 capsule 3   blood glucose meter kit and supplies Dispense based on patient and insurance preference. Use up to four times daily as directed. (FOR ICD-10 E10.9, E11.9). 1 each 0   Blood Glucose Monitoring Suppl (TRUE METRIX METER) w/Device KIT USE AS DIRECTED 1 kit 0   Dulaglutide (TRULICITY) 1.5 VO/3.5KK SOPN Inject 1.5 mg into the skin once a week. 2 mL 2   glucose blood test strip USE AS  DIRECTED FOUR TIMES DAILY (Patient taking differently: 4 (four) times daily. as directed) 100 strip 0   glucose blood test strip USE AS INSTRUCTED 100 strip 12   Insulin Pen Needle (PEN NEEDLES) 31G X 8 MM MISC UAD 100 each 6   Insulin Pen Needle 31G X 8 MM MISC USE AS DIRECTED 100 each 6   Insulin Pen Needle 32G X 4 MM MISC USE AS DIRECTED WITH INSULIN (Patient taking differently: See admin instructions. with insulin) 100 each 0   metFORMIN (GLUCOPHAGE) 500 MG tablet Take 2 tablets (1,000 mg total) by mouth 2 (two) times daily with a meal. 120 tablet 2    nystatin cream (MYCOSTATIN) Apply topically 2 (two) times daily. (Patient not taking: Reported on 01/30/2021) 30 g 0   nystatin cream (MYCOSTATIN) APPLY TOPICALLY TWO TIMES DAILY (Patient not taking: No sig reported) 30 g 0   sertraline (ZOLOFT) 50 MG tablet Take 1 tablet (50 mg total) by mouth daily. 30 tablet 3   No current facility-administered medications on file prior to visit.    Allergies  Allergen Reactions   Milk-Related Compounds Diarrhea   Iodine     Patient endorses unspecified reaction to iodine in past.. Just a one time thing.    Social History   Socioeconomic History   Marital status: Single    Spouse name: Not on file   Number of children: Not on file   Years of education: Not on file   Highest education level: Not on file  Occupational History   Occupation: Unemployed  Tobacco Use   Smoking status: Never   Smokeless tobacco: Never  Vaping Use   Vaping Use: Never used  Substance and Sexual Activity   Alcohol use: No    Alcohol/week: 0.0 standard drinks   Drug use: No   Sexual activity: Not on file  Other Topics Concern   Not on file  Social History Narrative   Live with parents in [place].    Social Determinants of Health   Financial Resource Strain: Not on file  Food Insecurity: Not on file  Transportation Needs: Not on file  Physical Activity: Not on file  Stress: Not on file  Social Connections: Not on file  Intimate Partner Violence: Not on file    Family History  Problem Relation Age of Onset   Diabetes Mother    Hyperlipidemia Father    Hypertension Father     Past Surgical History:  Procedure Laterality Date   AMPUTATION Left 03/06/2020   Procedure: LEFT GREAT TOE AMPUTATION;  Surgeon: Newt Minion, MD;  Location: [facility];  Service: Orthopedics;  Laterality: Left;   EYE SURGERY Bilateral    lasik   WISDOM TOOTH EXTRACTION      ROS: Review of Systems Negative except as stated above  PHYSICAL EXAM: BP 110/78   Pulse (!)  110   Resp 16   Wt (!) 350 lb 3.2 oz (158.8 kg)   SpO2 99%   BMI 46.20 kg/m   Wt Readings from Last 3 Encounters:  01/30/21 (!) 350 lb 3.2 oz (158.8 kg)  09/27/20 (!) 352 lb 6.4 oz (159.8 kg)  08/09/20 (!) 330 lb (149.7 kg)    Physical Exam   General appearance - alert, well appearing, and in no distress Mental status - normal mood, behavior, speech, dress, motor activity, and thought processes Neck - supple, no significant adenopathy Chest - clear to auscultation, no wheezes, rales or rhonchi, symmetric air entry Heart - mild tachycardia  but regular Extremities - no LE edema Diabetic Foot Exam - Simple   Simple Foot Form Visual Inspection See comments: Yes Sensation Testing See comments: Yes Pulse Check Posterior Tibialis and Dorsalis pulse intact bilaterally: Yes Comments Ulcer remain on the plantar surface of the right big toe.  Callous tissue has formed around it.  No drainage.  Does not appear infected. Pt had it covered with a bandaide.  Smaller ulcer on plantar surface LT foot near to 3-4 toes.       Pic above is ulcer on RT big toe  Depression screen [facility] 2/9 01/30/2021 01/22/2021 10/23/2020  Decreased Interest _0 Down, Depressed, Hopeless 1 0 1  PHQ - 2 Score _1 Altered sleeping _2 Tired, decreased energy _3 Change in appetite 1 1 0  Feeling bad or failure about yourself  _4 Trouble concentrating 1 0 2  Moving slowly or fidgety/restless 0 0 0  Suicidal thoughts 0 0 0  PHQ-9 Score _5 Difficult doing work/chores - Somewhat difficult Somewhat difficult  Some recent data might be hidden    CMP Latest Ref Rng & Units 10/28/2020 08/09/2020 03/26/2020  Glucose 65 - 99 mg/dL 262(H) 243(H) 288(H)  BUN 6 - 24 mg/dL _6 Creatinine 0.76 - 1.27 mg/dL 0.85 0.84 0.85  Sodium 134 - 144 mmol/L 138 134(L) 139  Potassium 3.5 - 5.2 mmol/L 4.4 4.2 4.6  Chloride 96 - 106 mmol/L 99 99 98  CO2 20 - 29 mmol/L _7 Calcium 8.7 - 10.2 mg/dL 9.6 9.2  9.8  Total Protein 6.0 - 8.5 g/dL 7.9 - 7.9  Total Bilirubin 0.0 - 1.2 mg/dL 0.3 - 0.2  Alkaline Phos 44 - 121 IU/L 102 - 90  AST 0 - 40 IU/L 14 - 15  ALT 0 - 44 IU/L 17 - 21   Lipid Panel     Component Value Date/Time   CHOL 185 10/28/2020 1032   TRIG 166 (H) 10/28/2020 1032   HDL 33 (L) 10/28/2020 1032   CHOLHDL 5.6 (H) 10/28/2020 1032   LDLCALC 122 (H) 10/28/2020 1032    CBC    Component Value Date/Time   WBC 14.8 (H) 08/09/2020 0626   RBC 5.59 08/09/2020 0626   HGB 15.5 08/09/2020 0626   HGB 15.0 06/04/2016 1030   HCT 46.7 08/09/2020 0626   HCT 45.8 06/04/2016 1030   PLT 275 08/09/2020 0626   PLT 254 06/04/2016 1030   MCV 83.5 08/09/2020 0626   MCV 82.6 05/02/2018 1521   MCV 83 06/04/2016 1030   MCH 27.7 08/09/2020 0626   MCHC 33.2 08/09/2020 0626   RDW 13.3 08/09/2020 0626   RDW 14.1 06/04/2016 1030   LYMPHSABS 1.9 03/08/2020 0002   LYMPHSABS 3.3 (H) 06/04/2016 1030   MONOABS 0.7 03/08/2020 0002   EOSABS 0.1 03/08/2020 0002   EOSABS 0.1 06/04/2016 1030   BASOSABS 0.1 03/08/2020 0002   BASOSABS 0.0 06/04/2016 1030    ASSESSMENT AND PLAN: 1. Type 2 diabetes mellitus with morbid obesity (HCC) A1c improved.  Commended him on this. Recommend increasing the NovoLog 70/30 mix to 39 units twice a day.  Continue Trulicity and metformin.  Encourage healthy eating habits. Encouraged him to get an eye exam done at least once a year.  Given the name of a few places where he can get an eye exam done at a reasonable cost since he  does not have insurance. - POCT glucose (manual entry) - insulin aspart protamine - aspart (NOVOLOG 70/30 MIX) (70-30) 100 UNIT/ML FlexPen; Inject 39 Units into the skin 2 (two) times daily.  Dispense: 15 mL; Refill: 11  2. Hyperlipidemia due to type 2 diabetes mellitus (HCC) LDL was not at goal.  Increase Pravachol to 40 mg daily. - pravastatin (PRAVACHOL) 40 MG tablet; Take 1 tablet (40 mg total) by mouth daily.  Dispense: 30 tablet; Refill:  6  3. Ulcer of right foot with fat layer exposed ([place]) Keep upcoming appointment with Dr. Sharol Given.  4. Moderate major depression, single episode [facility]) Patient reports he is doing well on Zoloft.  Followed by behavioral health.  5. Influenza vaccination declined Recommended.  Patient declined.    Patient was given the opportunity to ask questions.  Patient verbalized understanding of the plan and was able to repeat key elements of the plan.   Orders Placed This Encounter  Procedures   POCT glucose (manual entry)     Requested Prescriptions   Signed Prescriptions Disp Refills   pravastatin (PRAVACHOL) 40 MG tablet 30 tablet 6    Sig: Take 1 tablet (40 mg total) by mouth daily.   insulin aspart protamine - aspart (NOVOLOG 70/30 MIX) (70-30) 100 UNIT/ML FlexPen 15 mL 11    Sig: Inject 39 Units into the skin 2 (two) times daily.    Return in about 4 months (around 06/01/2021).  Karle Plumber, MD, FACP

## 2021-01-31 ENCOUNTER — Other Ambulatory Visit: Payer: Self-pay

## 2021-02-04 ENCOUNTER — Ambulatory Visit (INDEPENDENT_AMBULATORY_CARE_PROVIDER_SITE_OTHER): Payer: Self-pay

## 2021-02-04 ENCOUNTER — Ambulatory Visit: Payer: Self-pay

## 2021-02-04 ENCOUNTER — Encounter: Payer: Self-pay | Admitting: Family

## 2021-02-04 ENCOUNTER — Ambulatory Visit (INDEPENDENT_AMBULATORY_CARE_PROVIDER_SITE_OTHER): Payer: Self-pay | Admitting: Family

## 2021-02-04 DIAGNOSIS — M79671 Pain in right foot: Secondary | ICD-10-CM

## 2021-02-04 DIAGNOSIS — L97521 Non-pressure chronic ulcer of other part of left foot limited to breakdown of skin: Secondary | ICD-10-CM

## 2021-02-04 DIAGNOSIS — L03116 Cellulitis of left lower limb: Secondary | ICD-10-CM

## 2021-02-04 MED ORDER — DOXYCYCLINE HYCLATE 100 MG PO TABS: 100.0000 mg | ORAL_TABLET | Freq: Two times a day (BID) | ORAL | 0 refills | Status: DC

## 2021-02-04 NOTE — Progress Notes (Signed)
Office Visit Note   Patient: [name]           Date of Birth: March 05, 1981           MRN: [id number] Visit Date: 02/04/2021              Requested by: Marcine Matar, MD [address] [place],  [place] [address] PCP: Marcine Matar, MD  No chief complaint on file.     HPI: The patient is a 40 year old gentleman who presents today in routine follow-up.  He has had chronic ulceration beneath his right great toe he also is concerned for some swelling and warmth to his left leg.  He is not concerned about his right great toe feels this is stable no change he has been doing antibacterial ointment dressing changes.  Attempts to minimize his weightbearing he is in regular shoewear today.  Of the left leg there is worse swelling with redness up to the tibial tubercle.  Erythema just distally with some dimpling.  He does not have any new abrasions or bites no skin breakdown he is status post great toe amputation of the left  Assessment & Plan: Visit Diagnoses:  1. Ulcer of left foot, limited to breakdown of skin (HCC)   2. Right foot pain     Plan: Cellulitis left leg we will place on oral antibiotics discussed return precautions.  Patient to follow-up in the office.  Also discussed osteomyelitis of the right great toe.  At this point patient would like to pursue conservative measures we will follow him closely  Follow-Up Instructions: No follow-ups on file.   Ortho Exam  Patient is alert, oriented, no adenopathy, well-dressed, normal affect, normal respiratory effort. On examination of the left leg there are no ulcers he does have pitting edema up to the tibial tubercle with some mild erythema distally dimpling of the skin.  Mild warmth. there is no weeping. On examination of the right lower extremity he does not have pitting edema there is ulceration to the plantar aspect of his great toe this is dime sized is 5 mm deep there is necrotic tissue in the wound bed there  is no granulation no active drainage no surrounding erythema maceration no sign of infection.  Was debrided with a 10 blade knife of nonviable tissue back to viable tissue  Imaging: No results found. No images are attached to the encounter.  Labs: Lab Results  Component Value Date   HGBA1C 7.7 (A) 12/30/2020   HGBA1C 9.1 (A) 09/27/2020   HGBA1C 11.8 (H) 03/03/2020   ESRSEDRATE 48 (H) 03/21/2020   ESRSEDRATE 81 (H) 03/05/2020   CRP 5.6 03/21/2020   CRP 9.0 (H) 03/05/2020   REPTSTATUS 03/09/2020 FINAL 03/04/2020   CULT  03/04/2020    NO GROWTH 5 DAYS Performed at [facility], [address], [place], [place] [address]    LABORGA GROUP B STREP(S.AGALACTIAE)ISOLATED 03/02/2020     Lab Results  Component Value Date   ALBUMIN 4.6 10/28/2020   ALBUMIN 4.3 03/26/2020   ALBUMIN 2.5 (L) 03/03/2020    No results found for: MG No results found for: VD25OH  No results found for: PREALBUMIN CBC EXTENDED Latest Ref Rng & Units 08/09/2020 03/08/2020 03/07/2020  WBC 4.0 - 10.5 K/uL 14.8(H) 6.8 7.4  RBC 4.22 - 5.81 MIL/uL 5.59 4.71 4.74  HGB 13.0 - 17.0 g/dL 29.5 12.9(L) 12.7(L)  HCT 39.0 - 52.0 % 46.7 39.7 39.6  PLT 150 - 400  K/uL 275 349 322  NEUTROABS 1.7 - 7.7 K/uL - 3.9 4.8  LYMPHSABS 0.7 - 4.0 K/uL - 1.9 1.8     There is no height or weight on file to calculate BMI.  Orders:  Orders Placed This Encounter  Procedures   XR Toe Great Left   XR Toe Great Right   Meds ordered this encounter  Medications   doxycycline (VIBRA-TABS) 100 MG tablet    Sig: Take 1 tablet (100 mg total) by mouth 2 (two) times daily.    Dispense:  28 tablet    Refill:  0     Procedures: No procedures performed  Clinical Data: No additional findings.  ROS:  All other systems negative, except as noted in the HPI. Review of Systems  Constitutional:  Negative for chills and fever.  Cardiovascular:  Positive for leg swelling.   Objective: Vital Signs: There were no vitals taken  for this visit.  Specialty Comments:  No specialty comments available.  PMFS History: Patient Active Problem List   Diagnosis Date Noted   Attention deficit hyperactivity disorder (ADHD), predominantly inattentive type 07/30/2020   Anxiety with depression 06/07/2020   Hyperlipidemia due to type 2 diabetes mellitus (HCC) 03/27/2020   Status post amputation of left great toe (HCC) 03/26/2020   Influenza vaccination declined 03/26/2020   23-polyvalent pneumococcal polysaccharide vaccine declined 03/26/2020   COVID-19 vaccine series completed 03/26/2020   Moderate major depression, single episode (HCC) 03/26/2020   Anaerobic bacteremia 03/07/2020   Major depressive disorder, recurrent episode, moderate (HCC) 03/04/2020   Abscess of great toe, left    Sepsis due to group B Streptococcus with acute renal failure (HCC)    Cellulitis of left toe 03/03/2020   Diabetic ketoacidosis (HCC) 03/03/2020   DKA (diabetic ketoacidosis) (HCC) 03/03/2020   Ulcer of right foot with fat layer exposed (HCC) 06/06/2019   Non-pressure chronic ulcer of other part of left foot limited to breakdown of skin (HCC) 06/06/2019   Uncontrolled type 2 diabetes mellitus with hyperglycemia (HCC) 05/02/2018   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) 07/30/2017   Venous insufficiency of both lower extremities 07/30/2017   Obesity, Class III, BMI 40-49.9 (morbid obesity) (HCC) 07/30/2017   Essential hypertension 09/03/2016   Past Medical History:  Diagnosis Date   ADHD    Anxiety    Bipolar disorder (HCC)    Depression    Diabetes mellitus without complication (HCC)    type 2   HLD (hyperlipidemia)    Hypertension    no meds    Family History  Problem Relation Age of Onset   Diabetes Mother    Hyperlipidemia Father    Hypertension Father     Past Surgical History:  Procedure Laterality Date   AMPUTATION Left 03/06/2020   Procedure: LEFT GREAT TOE AMPUTATION;  Surgeon: [name], MD;  Location: [facility];   Service: Orthopedics;  Laterality: Left;   EYE SURGERY Bilateral    lasik   WISDOM TOOTH EXTRACTION     Social History   Occupational History   Occupation: Unemployed  Tobacco Use   Smoking status: Never   Smokeless tobacco: Never  Vaping Use   Vaping Use: Never used  Substance and Sexual Activity   Alcohol use: No    Alcohol/week: 0.0 standard drinks   Drug use: No   Sexual activity: Not on file

## 2021-02-05 ENCOUNTER — Encounter: Payer: Self-pay | Admitting: Family

## 2021-02-10 ENCOUNTER — Other Ambulatory Visit: Payer: Self-pay

## 2021-02-10 ENCOUNTER — Ambulatory Visit (INDEPENDENT_AMBULATORY_CARE_PROVIDER_SITE_OTHER): Payer: No Payment, Other | Admitting: Licensed Clinical Social Worker

## 2021-02-10 DIAGNOSIS — F411 Generalized anxiety disorder: Secondary | ICD-10-CM

## 2021-02-15 NOTE — Progress Notes (Signed)
   THERAPIST PROGRESS NOTE  Session Time: 40 min  Participation Level: Active  Behavioral Response: CasualAlertAnxious and Depressed  Type of Therapy: Individual Therapy  Treatment Goals addressed: Communication: dep/anx/coping  Interventions: Supportive  Summary: [name] is a 40 y.o. male who presents with hx of dep/anx. This date pt states "Decent I guess" when asked how he has been since last session. Pt taking meds as prescribed and denies any recent MH med adjustments. Pt reports he does think he is going to have to have his R big toe amputated. He provides details of recent symptoms and problems with non healing wound since Jan 2019. Pt already has L big toe amputated. Pt states because of his neuropathy he has little to no pain. Pt is completing a round of antibiotics and states he returns for eval 9/27. Pt states he feels sure he will have to have the amputation as told infection likely in the bone. Pt admits to some increased dep/anx r/t health and likely surgery. Remainder of session spent addressing coping with this surgery and recovery. Pt will be unable to walk/drive for some time.He agrees to call clinic to leave vm for this clinician once he has appt and knows about surgery. LCSW reviewed poc including scheduling prior to close of session. Pt states appreciation for care.    Suicidal/Homicidal: Nowithout intent/plan  Therapist Response: Pt remains receptive to care  Plan: Return again in ~4 weeks.  Diagnosis: Axis I: Generalized Anxiety Disorder   [place] Sink, LCSW 02/15/2021

## 2021-02-18 ENCOUNTER — Ambulatory Visit (INDEPENDENT_AMBULATORY_CARE_PROVIDER_SITE_OTHER): Payer: Self-pay | Admitting: Orthopedic Surgery

## 2021-02-18 ENCOUNTER — Other Ambulatory Visit: Payer: Self-pay | Admitting: Physician Assistant

## 2021-02-18 ENCOUNTER — Other Ambulatory Visit: Payer: Self-pay

## 2021-02-18 DIAGNOSIS — M79671 Pain in right foot: Secondary | ICD-10-CM

## 2021-02-18 DIAGNOSIS — L97511 Non-pressure chronic ulcer of other part of right foot limited to breakdown of skin: Secondary | ICD-10-CM

## 2021-02-18 MED ORDER — SULFAMETHOXAZOLE-TRIMETHOPRIM 800-160 MG PO TABS: 1.0000 | ORAL_TABLET | Freq: Two times a day (BID) | ORAL | 0 refills | Status: DC

## 2021-02-18 NOTE — Progress Notes (Signed)
Office Visit Note   Patient: [name]           Date of Birth: October 30, 1980           MRN: [id number] Visit Date: 02/18/2021              Requested by: Marcine Matar, MD [address] [place],  [place] [address] PCP: Marcine Matar, MD  Chief Complaint  Patient presents with   Left Leg - Follow-up   Right Foot - Pain    Great toe ulcer      HPI: Patient is a 40 year old gentleman who is status post left great toe amputation.  Also chronic ulcer beneath the IP joint of the right great toe.  Patient states he is completed a course of doxycycline.  Assessment & Plan: Visit Diagnoses:  1. Right foot pain     Plan: Patient will be placed in a postop shoe with a metatarsal pad and donut relief.  We will draw a uric acid patient does have a family history of gout.  The cystic bony changes may be secondary to gout.  Follow-Up Instructions: No follow-ups on file.   Ortho Exam  Patient is alert, oriented, no adenopathy, well-dressed, normal affect, normal respiratory effort. Examination of the left lower extremity extremity no redness no swelling no open areas of skin.  Well-healed surgical incision status post great toe amputation.  No signs of cellulitis or infection  Right toe ulcer measures about the size of a dime its about 5 mm deep.  He has a easily palpable dorsalis pedis and posterior tibial pulses has surrounding callus and and swelling of the great toe..  After obtaining verbal consent this was debrided to healthy viable tissue.  After debridement the ulcer is 2 cm in diameter 5 mm deep with healthy granulation tissue this was touched with silver nitrate for hemostasis silver cell and a Band-Aid was applied.  There is no foul odor no ascending cellulitis does have some erythema of the toe.  The ulcer does not probe to bone or tendon.  The great toe was not tender to palpation.  Review of the radiographs shows some cystic changes within the great toe.   There is no destructive cortical lesions beneath the ulcer.  These cystic changes may be secondary to gout.    Patient does not have a history of gout but he says his grandfather had gout.  Imaging: No results found. No images are attached to the encounter.  Labs: Lab Results  Component Value Date   HGBA1C 7.7 (A) 12/30/2020   HGBA1C 9.1 (A) 09/27/2020   HGBA1C 11.8 (H) 03/03/2020   ESRSEDRATE 48 (H) 03/21/2020   ESRSEDRATE 81 (H) 03/05/2020   CRP 5.6 03/21/2020   CRP 9.0 (H) 03/05/2020   REPTSTATUS 03/09/2020 FINAL 03/04/2020   CULT  03/04/2020    NO GROWTH 5 DAYS Performed at [facility], [address] [place], [place] [address]    LABORGA GROUP B STREP(S.AGALACTIAE)ISOLATED 03/02/2020     Lab Results  Component Value Date   ALBUMIN 4.6 10/28/2020   ALBUMIN 4.3 03/26/2020   ALBUMIN 2.5 (L) 03/03/2020    No results found for: MG No results found for: VD25OH  No results found for: PREALBUMIN CBC EXTENDED Latest Ref Rng & Units 08/09/2020 03/08/2020 03/07/2020  WBC 4.0 - 10.5 K/uL 14.8(H) 6.8 7.4  RBC 4.22 - 5.81 MIL/uL 5.59 4.71 4.74  HGB 13.0 - 17.0 g/dL 23.7 12.9(L) 12.7(L)  HCT 39.0 - 52.0 % 46.7 39.7 39.6  PLT 150 - 400 K/uL 275 349 322  NEUTROABS 1.7 - 7.7 K/uL - 3.9 4.8  LYMPHSABS 0.7 - 4.0 K/uL - 1.9 1.8     There is no height or weight on file to calculate BMI.  Orders:  Orders Placed This Encounter  Procedures   Uric acid   Meds ordered this encounter  Medications   sulfamethoxazole-trimethoprim (BACTRIM DS) 800-160 MG tablet    Sig: Take 1 tablet by mouth 2 (two) times daily.    Dispense:  30 tablet    Refill:  0     Procedures: No procedures performed  Clinical Data: No additional findings.  ROS:  All other systems negative, except as noted in the HPI. Review of Systems  Objective: Vital Signs: There were no vitals taken for this visit.  Specialty Comments:  No specialty comments available.  PMFS History: Patient  Active Problem List   Diagnosis Date Noted   Attention deficit hyperactivity disorder (ADHD), predominantly inattentive type 07/30/2020   Anxiety with depression 06/07/2020   Hyperlipidemia due to type 2 diabetes mellitus (HCC) 03/27/2020   Status post amputation of left great toe (HCC) 03/26/2020   Influenza vaccination declined 03/26/2020   23-polyvalent pneumococcal polysaccharide vaccine declined 03/26/2020   COVID-19 vaccine series completed 03/26/2020   Moderate major depression, single episode (HCC) 03/26/2020   Anaerobic bacteremia 03/07/2020   Major depressive disorder, recurrent episode, moderate (HCC) 03/04/2020   Abscess of great toe, left    Sepsis due to group B Streptococcus with acute renal failure (HCC)    Cellulitis of left toe 03/03/2020   Diabetic ketoacidosis (HCC) 03/03/2020   DKA (diabetic ketoacidosis) (HCC) 03/03/2020   Ulcer of right foot with fat layer exposed (HCC) 06/06/2019   Non-pressure chronic ulcer of other part of left foot limited to breakdown of skin (HCC) 06/06/2019   Uncontrolled type 2 diabetes mellitus with hyperglycemia (HCC) 05/02/2018   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) 07/30/2017   Venous insufficiency of both lower extremities 07/30/2017   Obesity, Class III, BMI 40-49.9 (morbid obesity) (HCC) 07/30/2017   Essential hypertension 09/03/2016   Past Medical History:  Diagnosis Date   ADHD    Anxiety    Bipolar disorder (HCC)    Depression    Diabetes mellitus without complication (HCC)    type 2   HLD (hyperlipidemia)    Hypertension    no meds    Family History  Problem Relation Age of Onset   Diabetes Mother    Hyperlipidemia Father    Hypertension Father     Past Surgical History:  Procedure Laterality Date   AMPUTATION Left 03/06/2020   Procedure: LEFT GREAT TOE AMPUTATION;  Surgeon: [name], MD;  Location: [facility] OR;  Service: Orthopedics;  Laterality: Left;   EYE SURGERY Bilateral    lasik   WISDOM TOOTH  EXTRACTION     Social History   Occupational History   Occupation: Unemployed  Tobacco Use   Smoking status: Never   Smokeless tobacco: Never  Vaping Use   Vaping Use: Never used  Substance and Sexual Activity   Alcohol use: No    Alcohol/week: 0.0 standard drinks   Drug use: No   Sexual activity: Not on file

## 2021-02-19 LAB — URIC ACID: Uric Acid, Serum: 5.1 mg/dL (ref 4.0–8.0)

## 2021-02-25 ENCOUNTER — Ambulatory Visit (INDEPENDENT_AMBULATORY_CARE_PROVIDER_SITE_OTHER): Payer: Self-pay | Admitting: Orthopedic Surgery

## 2021-02-25 ENCOUNTER — Encounter: Payer: Self-pay | Admitting: Orthopedic Surgery

## 2021-02-25 DIAGNOSIS — L97511 Non-pressure chronic ulcer of other part of right foot limited to breakdown of skin: Secondary | ICD-10-CM

## 2021-02-25 NOTE — Progress Notes (Signed)
Office Visit Note   Patient: [name]           Date of Birth: 1981/03/27           MRN: [id number] Visit Date: 02/25/2021              Requested by: Marcine Matar, MD [address] [place],  [place] [address] PCP: Marcine Matar, MD  Chief Complaint  Patient presents with   Right Foot - Follow-up      HPI: Patient is a 25 years gentleman who presents in follow-up for [facility] grade 1 ulcer plantar aspect right great toe IP joint.  He is currently wearing a arch support pad and a felt donut.  His uric acid was 5.1.  Assessment & Plan: Visit Diagnoses: No diagnosis found.  Plan: Patient does not have any clinical signs of infection or gout.  He will complete his course of Bactrim DS a new felt relieving pad is placed to further unload the great toe.  Continue with Band-Aid dressing changes patient feels like he is making progress at this time.  Follow-Up Instructions: Return in about 3 weeks (around 03/18/2021).   Ortho Exam  Patient is alert, oriented, no adenopathy, well-dressed, normal affect, normal respiratory effort. Examination patient has a good pulse the great toe has minimal range of motion with hallux rigidus he also has Achilles contracture with limited dorsiflexion of the ankle.  The ulcer beneath the IP joint has healthy granulation tissue this measures 15 mm in diameter 3 mm deep there is no exposed bone or tendon no tunneling.  The swelling in the great toe is improving.  Imaging: No results found. No images are attached to the encounter.  Labs: Lab Results  Component Value Date   HGBA1C 7.7 (A) 12/30/2020   HGBA1C 9.1 (A) 09/27/2020   HGBA1C 11.8 (H) 03/03/2020   ESRSEDRATE 48 (H) 03/21/2020   ESRSEDRATE 81 (H) 03/05/2020   CRP 5.6 03/21/2020   CRP 9.0 (H) 03/05/2020   LABURIC 5.1 02/18/2021   REPTSTATUS 03/09/2020 FINAL 03/04/2020   CULT  03/04/2020    NO GROWTH 5 DAYS Performed at [facility], [address]  [place], [place] [address]    LABORGA GROUP B STREP(S.AGALACTIAE)ISOLATED 03/02/2020     Lab Results  Component Value Date   ALBUMIN 4.6 10/28/2020   ALBUMIN 4.3 03/26/2020   ALBUMIN 2.5 (L) 03/03/2020    No results found for: MG No results found for: VD25OH  No results found for: PREALBUMIN CBC EXTENDED Latest Ref Rng & Units 08/09/2020 03/08/2020 03/07/2020  WBC 4.0 - 10.5 K/uL 14.8(H) 6.8 7.4  RBC 4.22 - 5.81 MIL/uL 5.59 4.71 4.74  HGB 13.0 - 17.0 g/dL 21.3 12.9(L) 12.7(L)  HCT 39.0 - 52.0 % 46.7 39.7 39.6  PLT 150 - 400 K/uL 275 349 322  NEUTROABS 1.7 - 7.7 K/uL - 3.9 4.8  LYMPHSABS 0.7 - 4.0 K/uL - 1.9 1.8     There is no height or weight on file to calculate BMI.  Orders:  No orders of the defined types were placed in this encounter.  No orders of the defined types were placed in this encounter.    Procedures: No procedures performed  Clinical Data: No additional findings.  ROS:  All other systems negative, except as noted in the HPI. Review of Systems  Objective: Vital Signs: There were no vitals taken for this visit.  Specialty Comments:  No specialty comments available.  PMFS  History: Patient Active Problem List   Diagnosis Date Noted   Attention deficit hyperactivity disorder (ADHD), predominantly inattentive type 07/30/2020   Anxiety with depression 06/07/2020   Hyperlipidemia due to type 2 diabetes mellitus (HCC) 03/27/2020   Status post amputation of left great toe (HCC) 03/26/2020   Influenza vaccination declined 03/26/2020   23-polyvalent pneumococcal polysaccharide vaccine declined 03/26/2020   COVID-19 vaccine series completed 03/26/2020   Moderate major depression, single episode (HCC) 03/26/2020   Anaerobic bacteremia 03/07/2020   Major depressive disorder, recurrent episode, moderate (HCC) 03/04/2020   Abscess of great toe, left    Sepsis due to group B Streptococcus with acute renal failure (HCC)    Cellulitis of left toe 03/03/2020    Diabetic ketoacidosis (HCC) 03/03/2020   DKA (diabetic ketoacidosis) (HCC) 03/03/2020   Ulcer of right foot with fat layer exposed (HCC) 06/06/2019   Non-pressure chronic ulcer of other part of left foot limited to breakdown of skin (HCC) 06/06/2019   Uncontrolled type 2 diabetes mellitus with hyperglycemia (HCC) 05/02/2018   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) 07/30/2017   Venous insufficiency of both lower extremities 07/30/2017   Obesity, Class III, BMI 40-49.9 (morbid obesity) (HCC) 07/30/2017   Essential hypertension 09/03/2016   Past Medical History:  Diagnosis Date   ADHD    Anxiety    Bipolar disorder (HCC)    Depression    Diabetes mellitus without complication (HCC)    type 2   HLD (hyperlipidemia)    Hypertension    no meds    Family History  Problem Relation Age of Onset   Diabetes Mother    Hyperlipidemia Father    Hypertension Father     Past Surgical History:  Procedure Laterality Date   AMPUTATION Left 03/06/2020   Procedure: LEFT GREAT TOE AMPUTATION;  Surgeon: [name], MD;  Location: [facility] OR;  Service: Orthopedics;  Laterality: Left;   EYE SURGERY Bilateral    lasik   WISDOM TOOTH EXTRACTION     Social History   Occupational History   Occupation: Unemployed  Tobacco Use   Smoking status: Never   Smokeless tobacco: Never  Vaping Use   Vaping Use: Never used  Substance and Sexual Activity   Alcohol use: No    Alcohol/week: 0.0 standard drinks   Drug use: No   Sexual activity: Not on file

## 2021-03-10 ENCOUNTER — Other Ambulatory Visit: Payer: Self-pay | Admitting: Internal Medicine

## 2021-03-10 ENCOUNTER — Other Ambulatory Visit: Payer: Self-pay

## 2021-03-10 ENCOUNTER — Ambulatory Visit (HOSPITAL_COMMUNITY): Payer: No Payment, Other | Admitting: Licensed Clinical Social Worker

## 2021-03-10 MED ORDER — TRULICITY 1.5 MG/0.5ML ~~LOC~~ SOAJ
1.5000 mg | SUBCUTANEOUS | 2 refills | Status: DC
  Filled 2021-03-10: qty 2, 28d supply, fill #0
  Filled 2021-04-14: qty 2, 28d supply, fill #1
  Filled 2021-05-29 – 2021-06-05 (×2): qty 2, 28d supply, fill #0
  Filled ????-??-??: fill #2

## 2021-03-11 ENCOUNTER — Other Ambulatory Visit: Payer: Self-pay

## 2021-03-18 ENCOUNTER — Other Ambulatory Visit: Payer: Self-pay

## 2021-03-18 ENCOUNTER — Ambulatory Visit (INDEPENDENT_AMBULATORY_CARE_PROVIDER_SITE_OTHER): Payer: Self-pay | Admitting: Orthopedic Surgery

## 2021-03-18 ENCOUNTER — Encounter: Payer: Self-pay | Admitting: Orthopedic Surgery

## 2021-03-18 DIAGNOSIS — L97511 Non-pressure chronic ulcer of other part of right foot limited to breakdown of skin: Secondary | ICD-10-CM

## 2021-03-18 NOTE — Progress Notes (Signed)
Office Visit Note   Patient: [name]           Date of Birth: April 10, 1981           MRN: [id number] Visit Date: 03/18/2021              Requested by: Marcine Matar, MD [address] [place],  [place] [address] PCP: Marcine Matar, MD  Chief Complaint  Patient presents with   Right Foot - Follow-up      HPI: Patient is seen in follow-up for a [facility] grade 1 ulcer plantar aspect right great toe IP joint.  Patient is currently wearing a postoperative shoe with a felt relieving donut.  Patient states he has not been doing his Achilles stretching.  Assessment & Plan: Visit Diagnoses:  1. Ulcer of toe of right foot, limited to breakdown of skin (HCC)     Plan: Resume Achilles stretching discussed the importance of the Achilles range of motion and unload pressure from the great toe.  Discussed that we may need to consider a gastrocnemius recession.  Follow-Up Instructions: Return in about 3 weeks (around 04/08/2021).   Ortho Exam  Patient is alert, oriented, no adenopathy, well-dressed, normal affect, normal respiratory effort. Examination patient has equinus contracture with dorsiflexion just short of neutral with his knee extended.  He also has hallux rigidus with dorsiflexion of 20 degrees of the great toe there is a thin amount of callus around the ulcer which is 10 mm in diameter and 3 mm deep there is healthy granulation tissue there is no sausage digit swelling no drainage no odor.  Imaging: No results found. No images are attached to the encounter.  Labs: Lab Results  Component Value Date   HGBA1C 7.7 (A) 12/30/2020   HGBA1C 9.1 (A) 09/27/2020   HGBA1C 11.8 (H) 03/03/2020   ESRSEDRATE 48 (H) 03/21/2020   ESRSEDRATE 81 (H) 03/05/2020   CRP 5.6 03/21/2020   CRP 9.0 (H) 03/05/2020   LABURIC 5.1 02/18/2021   REPTSTATUS 03/09/2020 FINAL 03/04/2020   CULT  03/04/2020    NO GROWTH 5 DAYS Performed at [facility], [address]  [place], [place] [address]    LABORGA GROUP B STREP(S.AGALACTIAE)ISOLATED 03/02/2020     Lab Results  Component Value Date   ALBUMIN 4.6 10/28/2020   ALBUMIN 4.3 03/26/2020   ALBUMIN 2.5 (L) 03/03/2020    No results found for: MG No results found for: VD25OH  No results found for: PREALBUMIN CBC EXTENDED Latest Ref Rng & Units 08/09/2020 03/08/2020 03/07/2020  WBC 4.0 - 10.5 K/uL 14.8(H) 6.8 7.4  RBC 4.22 - 5.81 MIL/uL 5.59 4.71 4.74  HGB 13.0 - 17.0 g/dL 38.1 12.9(L) 12.7(L)  HCT 39.0 - 52.0 % 46.7 39.7 39.6  PLT 150 - 400 K/uL 275 349 322  NEUTROABS 1.7 - 7.7 K/uL - 3.9 4.8  LYMPHSABS 0.7 - 4.0 K/uL - 1.9 1.8     There is no height or weight on file to calculate BMI.  Orders:  No orders of the defined types were placed in this encounter.  No orders of the defined types were placed in this encounter.    Procedures: No procedures performed  Clinical Data: No additional findings.  ROS:  All other systems negative, except as noted in the HPI. Review of Systems  Objective: Vital Signs: There were no vitals taken for this visit.  Specialty Comments:  No specialty comments available.  PMFS History: Patient Active Problem List  Diagnosis Date Noted   Attention deficit hyperactivity disorder (ADHD), predominantly inattentive type 07/30/2020   Anxiety with depression 06/07/2020   Hyperlipidemia due to type 2 diabetes mellitus (HCC) 03/27/2020   Status post amputation of left great toe (HCC) 03/26/2020   Influenza vaccination declined 03/26/2020   23-polyvalent pneumococcal polysaccharide vaccine declined 03/26/2020   COVID-19 vaccine series completed 03/26/2020   Moderate major depression, single episode (HCC) 03/26/2020   Anaerobic bacteremia 03/07/2020   Major depressive disorder, recurrent episode, moderate (HCC) 03/04/2020   Abscess of great toe, left    Sepsis due to group B Streptococcus with acute renal failure (HCC)    Cellulitis of left toe 03/03/2020    Diabetic ketoacidosis (HCC) 03/03/2020   DKA (diabetic ketoacidosis) (HCC) 03/03/2020   Ulcer of right foot with fat layer exposed (HCC) 06/06/2019   Non-pressure chronic ulcer of other part of left foot limited to breakdown of skin (HCC) 06/06/2019   Uncontrolled type 2 diabetes mellitus with hyperglycemia (HCC) 05/02/2018   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) 07/30/2017   Venous insufficiency of both lower extremities 07/30/2017   Obesity, Class III, BMI 40-49.9 (morbid obesity) (HCC) 07/30/2017   Essential hypertension 09/03/2016   Past Medical History:  Diagnosis Date   ADHD    Anxiety    Bipolar disorder (HCC)    Depression    Diabetes mellitus without complication (HCC)    type 2   HLD (hyperlipidemia)    Hypertension    no meds    Family History  Problem Relation Age of Onset   Diabetes Mother    Hyperlipidemia Father    Hypertension Father     Past Surgical History:  Procedure Laterality Date   AMPUTATION Left 03/06/2020   Procedure: LEFT GREAT TOE AMPUTATION;  Surgeon: [name], MD;  Location: [facility] OR;  Service: Orthopedics;  Laterality: Left;   EYE SURGERY Bilateral    lasik   WISDOM TOOTH EXTRACTION     Social History   Occupational History   Occupation: Unemployed  Tobacco Use   Smoking status: Never   Smokeless tobacco: Never  Vaping Use   Vaping Use: Never used  Substance and Sexual Activity   Alcohol use: No    Alcohol/week: 0.0 standard drinks   Drug use: No   Sexual activity: Not on file

## 2021-03-20 ENCOUNTER — Ambulatory Visit (HOSPITAL_COMMUNITY): Payer: No Payment, Other | Admitting: Licensed Clinical Social Worker

## 2021-04-08 ENCOUNTER — Ambulatory Visit: Payer: Self-pay | Admitting: Orthopedic Surgery

## 2021-04-09 ENCOUNTER — Ambulatory Visit (INDEPENDENT_AMBULATORY_CARE_PROVIDER_SITE_OTHER): Payer: No Payment, Other | Admitting: Licensed Clinical Social Worker

## 2021-04-09 DIAGNOSIS — F411 Generalized anxiety disorder: Secondary | ICD-10-CM

## 2021-04-14 ENCOUNTER — Other Ambulatory Visit: Payer: Self-pay

## 2021-04-14 ENCOUNTER — Other Ambulatory Visit: Payer: Self-pay | Admitting: Internal Medicine

## 2021-04-14 DIAGNOSIS — E1165 Type 2 diabetes mellitus with hyperglycemia: Secondary | ICD-10-CM

## 2021-04-14 MED ORDER — METFORMIN HCL 500 MG PO TABS
1000.0000 mg | ORAL_TABLET | Freq: Two times a day (BID) | ORAL | 2 refills | Status: DC
  Filled 2021-04-14 – 2021-06-05 (×2): qty 120, 30d supply, fill #0

## 2021-04-14 MED FILL — Glucose Blood Test Strip: 30 days supply | Qty: 100 | Fill #1 | Status: AC

## 2021-04-14 NOTE — Telephone Encounter (Signed)
Requested Prescriptions  Pending Prescriptions Disp Refills  . metFORMIN (GLUCOPHAGE) 500 MG tablet 120 tablet 2    Sig: Take 2 tablets (1,000 mg total) by mouth 2 (two) times daily with a meal.     Endocrinology:  Diabetes - Biguanides Passed - 04/14/2021  4:27 PM      Passed - Cr in normal range and within 360 days    Creatinine, Ser  Date Value Ref Range Status  10/28/2020 0.85 0.76 - 1.27 mg/dL Final         Passed - HBA1C is between 0 and 7.9 and within 180 days    Hemoglobin A1C  Date Value Ref Range Status  12/30/2020 7.7 (A) 4.0 - 5.6 % Final   HbA1c, POC (controlled diabetic range)  Date Value Ref Range Status  09/27/2020 9.1 (A) 0.0 - 7.0 % Final         Passed - eGFR in normal range and within 360 days    GFR calc Af Amer  Date Value Ref Range Status  03/26/2020 126 >59 mL/min/1.73 Final    Comment:    **In accordance with recommendations from the NKF-ASN Task force,**   [facility] is in the process of updating its eGFR calculation to the   2021 CKD-EPI creatinine equation that estimates kidney function   without a race variable.    GFR, Estimated  Date Value Ref Range Status  08/09/2020 >60 >60 mL/min Final    Comment:    (NOTE) Calculated using the CKD-EPI Creatinine Equation (2021)    eGFR  Date Value Ref Range Status  10/28/2020 112 >59 mL/min/1.73 Final         Passed - Valid encounter within last 6 months    Recent Outpatient Visits          2 months ago Type 2 diabetes mellitus with morbid obesity ([place])   [name], Neoma Laming B, MD   3 months ago Type 2 diabetes mellitus with morbid obesity [facility])   [place], Annie Main L, RPH-CPP   4 months ago Type 2 diabetes mellitus with morbid obesity [facility])   [place], Annie Main L, RPH-CPP   5 months ago Hyperlipidemia due to type 2 diabetes mellitus [facility])   [place], Jarome Matin, RPH-CPP   6 months ago Type 2 diabetes mellitus with morbid obesity [facility])   [place], Deborah B, MD      Future Appointments            In 1 week Newt Minion, MD [facility]   In 1 month Ladell Pier, MD [place]

## 2021-04-14 NOTE — Progress Notes (Signed)
   THERAPIST PROGRESS NOTE   Virtual Visit via Video Note  I connected with [name] on 04/09/21 at  9:00 AM EST by a video enabled telemedicine application and verified that I am speaking with the correct person using two identifiers.  Location: Patient: Home Provider: [facility]   I discussed the limitations of evaluation and management by telemedicine and the availability of in person appointments. The patient expressed understanding and agreed to proceed. I discussed the assessment and treatment plan with the patient. The patient was provided an opportunity to ask questions and all were answered. The patient agreed with the plan and demonstrated an understanding of the instructions.   The patient was advised to call back or seek an in-person evaluation if the symptoms worsen or if the condition fails to improve as anticipated.  I provided 17 minutes of non-face-to-face time during this encounter.  Participation Level: Active  Behavioral Response: CasualLethargicDysphoric  Type of Therapy: Individual Therapy  Treatment Goals addressed: Anxiety and Coping  Interventions: Supportive  Summary: [name] is a 40 y.o. male who presents with hx of anx.  Today patient logs on for video session per his requested change this morning.  Patient reports he is sick with a "cold".  Patient states he has been sick for approximately 1 week, congestion is noted.  He denies being tested for COVID, RSV, or the flu.  Patient taking over-the-counter medications.  Patient states he and his parents have all been sick and got it from his young nephews ( one yo twins and a 29 yo) whom they have been helping to care for while his brother is out of town.  Patient reports his mother did get tested for COVID and was negative.  LCSW assessed for status of patient having his toe amputated as he felt this would happen last session.  Patient reports he ended up seeing Dr. Lajoyce Corners who gave him a different  antibiotic and surgery has been avoided.  Patient states he continues to go to the wound clinic approximately every 3 weeks.  He states wound is healing better than it ever has before with new medication.  Patient reports he is taking mental health medications as prescribed and feels that his anxiety is well managed at this time.  LCSW assessed for what patient will do for the upcoming Thanksgiving holiday.  Patient states "I have no clue".  He clarifies that he will be getting together with family and states his role is to "Help make food disappear".  LCSW wished patient an early happy birthday.  He reports he also does not have specific plans for his birthday.  The more patient talks the more he begins to cough so session is ended early. LCSW reviewed poc including scheduling prior to close of session. Pt states appreciation for care.   Suicidal/Homicidal: Nowithout intent/plan  Therapist Response: Pt remains receptive to care.  Plan: Return again in 4 weeks.  Diagnosis: Axis I: Generalized Anxiety Disorder  Heyworth Sink, LCSW 04/14/2021

## 2021-04-15 ENCOUNTER — Other Ambulatory Visit: Payer: Self-pay

## 2021-04-21 ENCOUNTER — Other Ambulatory Visit: Payer: Self-pay

## 2021-04-21 ENCOUNTER — Ambulatory Visit (INDEPENDENT_AMBULATORY_CARE_PROVIDER_SITE_OTHER): Payer: Self-pay | Admitting: Orthopedic Surgery

## 2021-04-21 ENCOUNTER — Encounter: Payer: Self-pay | Admitting: Orthopedic Surgery

## 2021-04-21 DIAGNOSIS — L97511 Non-pressure chronic ulcer of other part of right foot limited to breakdown of skin: Secondary | ICD-10-CM

## 2021-04-21 DIAGNOSIS — M6701 Short Achilles tendon (acquired), right ankle: Secondary | ICD-10-CM

## 2021-04-21 NOTE — Progress Notes (Signed)
Office Visit Note   Patient: [name]           Date of Birth: [birth year]           MRN: [id number] Visit Date: 04/21/2021              Requested by: Ladell Pier, MD [address] 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  Patient states he has not been able to do any stretching lately.  He has been wearing a postoperative shoe with felt relieving pads to unload pressure from the great toe.  Assessment & Plan: Visit Diagnoses:  1. Ulcer of toe of right foot, limited to breakdown of skin ([place])   2. Achilles tendon contracture, right     Plan: We will apply a new felt relieving donut in his postoperative shoe will give him an extra donut so he can wear this in his sneaker.  He will start working on Achilles stretching.  Follow-Up Instructions: Return in about 4 weeks (around 05/19/2021).   Ortho Exam  Patient is alert, oriented, no adenopathy, well-dressed, normal affect, normal respiratory effort. Examination patient has palpable pulses.  He has a Hydrographic surveyor grade 1 ulcer beneath the IP joint of the great toe with no increased callus formation the wound bed has good granulation tissue this does not probe to bone.  There is some swelling of the toe but no cellulitis no exposed bone.  With the knee extended he has dorsiflexion about 10 degrees short of neutral he has hallux rigidus with dorsiflexion of only about 20 degrees.  Imaging: No results found. No images are attached to the encounter.  Labs: Lab Results  Component Value Date   HGBA1C 7.7 (A) 12/30/2020   HGBA1C 9.1 (A) 09/27/2020   HGBA1C 11.8 (H) 03/03/2020   ESRSEDRATE 48 (H) 03/21/2020   ESRSEDRATE 81 (H) 03/05/2020   CRP 5.6 03/21/2020   CRP 9.0 (H) 03/05/2020   LABURIC 5.1  02/18/2021   REPTSTATUS 03/09/2020 FINAL 03/04/2020   CULT  03/04/2020    NO GROWTH 5 DAYS Performed at [facility], [place] [address], [place], [place] [address]    LABORGA GROUP B STREP(S.AGALACTIAE)ISOLATED 03/02/2020     Lab Results  Component Value Date   ALBUMIN 4.6 10/28/2020   ALBUMIN 4.3 03/26/2020   ALBUMIN 2.5 (L) 03/03/2020    No results found for: MG No results found for: VD25OH  No results found for: PREALBUMIN CBC EXTENDED Latest Ref Rng & Units 08/09/2020 03/08/2020 03/07/2020  WBC 4.0 - 10.5 K/uL 14.8(H) 6.8 7.4  RBC 4.22 - 5.81 MIL/uL 5.59 4.71 4.74  HGB 13.0 - 17.0 g/dL 15.5 12.9(L) 12.7(L)  HCT 39.0 - 52.0 % 46.7 39.7 39.6  PLT 150 - 400 K/uL 275 349 322  NEUTROABS 1.7 - 7.7 K/uL - 3.9 4.8  LYMPHSABS 0.7 - 4.0 K/uL - 1.9 1.8     There is no height or weight on file to calculate BMI.  Orders:  No orders of the defined types were placed in this encounter.  No orders of the defined types were placed in this encounter.    Procedures: No procedures performed  Clinical Data: No additional findings.  ROS:  All other systems negative, except as  noted in the HPI. Review of Systems  Objective: Vital Signs: There were no vitals taken for this visit.  Specialty Comments:  No specialty comments available.  PMFS History: Patient Active Problem List   Diagnosis Date Noted   Attention deficit hyperactivity disorder (ADHD), predominantly inattentive type 07/30/2020   Anxiety with depression 06/07/2020   Hyperlipidemia due to type 2 diabetes mellitus (HCC) 03/27/2020   Status post amputation of left great toe (HCC) 03/26/2020   Influenza vaccination declined 03/26/2020   23-polyvalent pneumococcal polysaccharide vaccine declined 03/26/2020   COVID-19 vaccine series completed 03/26/2020   Moderate major depression, single episode (HCC) 03/26/2020   Anaerobic bacteremia 03/07/2020   Major depressive disorder, recurrent episode, moderate (HCC)  03/04/2020   Abscess of great toe, left    Sepsis due to group B Streptococcus with acute renal failure (HCC)    Cellulitis of left toe 03/03/2020   Diabetic ketoacidosis (HCC) 03/03/2020   DKA (diabetic ketoacidosis) (HCC) 03/03/2020   Ulcer of right foot with fat layer exposed (HCC) 06/06/2019   Non-pressure chronic ulcer of other part of left foot limited to breakdown of skin (HCC) 06/06/2019   Uncontrolled type 2 diabetes mellitus with hyperglycemia (HCC) 05/02/2018   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) 07/30/2017   Venous insufficiency of both lower extremities 07/30/2017   Obesity, Class III, BMI 40-49.9 (morbid obesity) (HCC) 07/30/2017   Essential hypertension 09/03/2016   Past Medical History:  Diagnosis Date   ADHD    Anxiety    Bipolar disorder (HCC)    Depression    Diabetes mellitus without complication (HCC)    type 2   HLD (hyperlipidemia)    Hypertension    no meds    Family History  Problem Relation Age of Onset   Diabetes Mother    Hyperlipidemia Father    Hypertension Father     Past Surgical History:  Procedure Laterality Date   AMPUTATION Left 03/06/2020   Procedure: LEFT GREAT TOE AMPUTATION;  Surgeon: [name], MD;  Location: [facility] OR;  Service: Orthopedics;  Laterality: Left;   EYE SURGERY Bilateral    lasik   WISDOM TOOTH EXTRACTION     Social History   Occupational History   Occupation: Unemployed  Tobacco Use   Smoking status: Never   Smokeless tobacco: Never  Vaping Use   Vaping Use: Never used  Substance and Sexual Activity   Alcohol use: No    Alcohol/week: 0.0 standard drinks   Drug use: No   Sexual activity: Not on file

## 2021-04-30 ENCOUNTER — Telehealth (INDEPENDENT_AMBULATORY_CARE_PROVIDER_SITE_OTHER): Payer: No Payment, Other | Admitting: Psychiatry

## 2021-04-30 ENCOUNTER — Encounter (HOSPITAL_COMMUNITY): Payer: Self-pay | Admitting: Psychiatry

## 2021-04-30 DIAGNOSIS — F321 Major depressive disorder, single episode, moderate: Secondary | ICD-10-CM | POA: Diagnosis not present

## 2021-04-30 DIAGNOSIS — F900 Attention-deficit hyperactivity disorder, predominantly inattentive type: Secondary | ICD-10-CM

## 2021-04-30 MED ORDER — ATOMOXETINE HCL 80 MG PO CAPS: 80.0000 mg | ORAL_CAPSULE | Freq: Every day | ORAL | 3 refills | Status: DC

## 2021-04-30 MED ORDER — SERTRALINE HCL 50 MG PO TABS: 50.0000 mg | ORAL_TABLET | Freq: Every day | ORAL | 3 refills | Status: DC

## 2021-04-30 NOTE — Progress Notes (Signed)
[facility] MD/PA/NP OP Progress Note Virtual Visit via Video Note  I connected with [name] on 04/30/21 at 10:00 AM EST by a video enabled telemedicine application and verified that I am speaking with the correct person using two identifiers.  Location: Patient: Home Provider: Clinic   I discussed the limitations of evaluation and management by telemedicine and the availability of in person appointments. The patient expressed understanding and agreed to proceed.  I provided 30 minutes of non-face-to-face time during this encounter.    04/30/2021 10:28 AM [name]  MRN:  [id number]  Chief Complaint: "I am pretty good"  HPI: 40 year old male seen today for followup psychiatric evaluation. He has a psychiatric history of anxiety and depression.  He is currently managed on Zoloft 50 mg daily and Strattera 80 mg daily.  He notes his medications are effective in managing his psychiatric conditions.   Today he is well-groomed, pleasant,cooperative, engaged in conversation, maintained eye contact.  He informed provider that he has been doing pretty good.  He notes that continues to work at find enjoyment in his job.  He notes that recently he celebrated his birthday with some friends.  Patient informed writer that his medications are effective but notes that he has minimal anxiety and depression.  Provider conducted a GAD-7 and patient scored a 4, at his last visit he scored a 7.  Provider also conducted PHQ-9 he scored 10, at his last visit he scored an 8. Today he denies SI/HI/VAH, mania or paranoia.     No medication changes made today.  Patient will continue all medications as prescribed and follow-up with outpatient counseling for therapy.  No other concerns noted at this time.   Visit Diagnosis:    ICD-10-CM   1. Attention deficit hyperactivity disorder (ADHD), predominantly inattentive type  F90.0 atomoxetine (STRATTERA) 80 MG capsule    2. Moderate major depression, single  episode (HCC)  F32.1 sertraline (ZOLOFT) 50 MG tablet      Past Psychiatric History: anxiety and depression  Past Medical History:  Past Medical History:  Diagnosis Date   ADHD    Anxiety    Bipolar disorder ([place])    Depression    Diabetes mellitus without complication ([place])    type 2   HLD (hyperlipidemia)    Hypertension    no meds    Past Surgical History:  Procedure Laterality Date   AMPUTATION Left 03/06/2020   Procedure: LEFT GREAT TOE AMPUTATION;  Surgeon: Newt Minion, MD;  Location: [facility];  Service: Orthopedics;  Laterality: Left;   EYE SURGERY Bilateral    lasik   WISDOM TOOTH EXTRACTION      Family Psychiatric History: Mother depression, father depression, brother depression, maternal uncles alcohol dependence  Family History:  Family History  Problem Relation Age of Onset   Diabetes Mother    Hyperlipidemia Father    Hypertension Father     Social History:  Social History   Socioeconomic History   Marital status: Single    Spouse name: Not on file   Number of children: Not on file   Years of education: Not on file   Highest education level: Not on file  Occupational History   Occupation: Unemployed  Tobacco Use   Smoking status: Never   Smokeless tobacco: Never  Vaping Use   Vaping Use: Never used  Substance and Sexual Activity   Alcohol use: No    Alcohol/week: 0.0 standard drinks   Drug use: No  Sexual activity: Not on file  Other Topics Concern   Not on file  Social History Narrative   Live with parents in [place].    Social Determinants of Health   Financial Resource Strain: Not on file  Food Insecurity: Not on file  Transportation Needs: Not on file  Physical Activity: Not on file  Stress: Not on file  Social Connections: Not on file    Allergies:  Allergies  Allergen Reactions   Milk-Related Compounds Diarrhea   Iodine     Patient endorses unspecified reaction to iodine in past.. Just a one time thing.     Metabolic Disorder Labs: Lab Results  Component Value Date   HGBA1C 7.7 (A) 12/30/2020   MPG 291.96 03/03/2020   No results found for: PROLACTIN Lab Results  Component Value Date   CHOL 185 10/28/2020   TRIG 166 (H) 10/28/2020   HDL 33 (L) 10/28/2020   CHOLHDL 5.6 (H) 10/28/2020   LDLCALC 122 (H) 10/28/2020   LDLCALC 140 (H) 03/26/2020   No results found for: TSH  Therapeutic Level Labs: No results found for: LITHIUM No results found for: VALPROATE No components found for:  CBMZ  Current Medications: Current Outpatient Medications  Medication Sig Dispense Refill   atomoxetine (STRATTERA) 80 MG capsule Take 1 capsule (80 mg total) by mouth daily. 30 capsule 3   blood glucose meter kit and supplies Dispense based on patient and insurance preference. Use up to four times daily as directed. (FOR ICD-10 E10.9, E11.9). 1 each 0   doxycycline (VIBRA-TABS) 100 MG tablet Take 1 tablet (100 mg total) by mouth 2 (two) times daily. 28 tablet 0   Dulaglutide (TRULICITY) 1.5 HQ/7.5FF SOPN Inject 1.5 mg into the skin once a week. 2 mL 2   glucose blood test strip USE AS INSTRUCTED 100 strip 12   insulin aspart protamine - aspart (NOVOLOG 70/30 MIX) (70-30) 100 UNIT/ML FlexPen Inject 39 Units into the skin 2 (two) times daily. 15 mL 11   Insulin Pen Needle (PEN NEEDLES) 31G X 8 MM MISC UAD 100 each 6   Insulin Pen Needle 31G X 8 MM MISC USE AS DIRECTED 100 each 6   metFORMIN (GLUCOPHAGE) 500 MG tablet Take 2 tablets (1,000 mg total) by mouth 2 (two) times daily with a meal. 120 tablet 2   nystatin cream (MYCOSTATIN) Apply topically 2 (two) times daily. (Patient not taking: Reported on 01/30/2021) 30 g 0   pravastatin (PRAVACHOL) 40 MG tablet Take 1 tablet (40 mg total) by mouth daily. 30 tablet 6   sertraline (ZOLOFT) 50 MG tablet Take 1 tablet (50 mg total) by mouth daily. 30 tablet 3   sulfamethoxazole-trimethoprim (BACTRIM DS) 800-160 MG tablet Take 1 tablet by mouth 2 (two) times daily.  30 tablet 0   No current facility-administered medications for this visit.     Musculoskeletal: Strength & Muscle Tone:  Unable to assess due to telehealth visit [place]:  Unable to assess due to telehealth visit Patient leans: N/A  Psychiatric Specialty Exam: Review of Systems  There were no vitals taken for this visit.There is no height or weight on file to calculate BMI.  General Appearance: Well Groomed  Eye Contact:  Good  Speech:  Clear and Coherent and Normal Rate  Volume:  Normal  Mood:  Euthymic  Affect:  Appropriate and Congruent  Thought Process:  Coherent, Goal Directed and Linear  Orientation:  Full (Time, Place, and Person)  Thought Content: WDL and Logical  Suicidal Thoughts:  No  Homicidal Thoughts:  No  Memory:  Immediate;   Good Recent;   Good Remote;   Good  Judgement:  Good  Insight:  Good  Psychomotor Activity:  Normal  Concentration:  Concentration: Good and Attention Span: Good  Recall:  Good  Fund of Knowledge: Good  Language: Good  Akathisia:  No  Handed:  Right  AIMS (if indicated): Not done  Assets:  Communication Skills Desire for Improvement Financial Resources/Insurance Housing Leisure Time Physical Health Social Support  ADL's:  Intact  Cognition: WNL  Sleep:  Good   Screenings: GAD-7    Flowsheet Row Video Visit from 04/30/2021 in [facility] Office Visit from 01/30/2021 in [place] Video Visit from 01/22/2021 in [facility] Video Visit from 10/23/2020 in [facility] Office Visit from 09/27/2020 in [place]  Total GAD-7 Score _0 PHQ2-9    Flowsheet Row Video Visit from 04/30/2021 in [facility] Office Visit from 01/30/2021 in [place] Video Visit from 01/22/2021 in [facility]  Video Visit from 10/23/2020 in [facility] Office Visit from 09/27/2020 in [facility]  PHQ-2 Total Score _1 PHQ-9 Total Score _2 Flowsheet Row Video Visit from 10/23/2020 in [facility] Admission (Discharged) from 08/09/2020 in [place] Video Visit from 07/30/2020 in [place] No Risk No Risk No Risk        Assessment and Plan: Patient notes that he is doing well on his current medication regimen.  No medication changes made today.  Patient will continue all medications as prescribed.  1. Attention deficit hyperactivity disorder (ADHD), predominantly inattentive type  Continue- atomoxetine (STRATTERA) 80 MG capsule; Take 1 capsule (80 mg total) by mouth daily.  Dispense: 30 capsule; Refill: 3  2. Moderate major depression, single episode (HCC)  Continue- sertraline (ZOLOFT) 50 MG tablet; Take 1 tablet (50 mg total) by mouth daily.  Dispense: 30 tablet; Refill: 3  Follow-up in 3 Follow-up therapy  Salley Slaughter, NP 04/30/2021, 10:28 AM

## 2021-05-07 ENCOUNTER — Other Ambulatory Visit: Payer: Self-pay

## 2021-05-07 ENCOUNTER — Ambulatory Visit (INDEPENDENT_AMBULATORY_CARE_PROVIDER_SITE_OTHER): Payer: No Payment, Other | Admitting: Licensed Clinical Social Worker

## 2021-05-07 DIAGNOSIS — F411 Generalized anxiety disorder: Secondary | ICD-10-CM | POA: Diagnosis not present

## 2021-05-08 NOTE — Progress Notes (Signed)
° °  THERAPIST PROGRESS NOTE  Session Time: 32 min  Participation Level: Active  Behavioral Response: CasualAlertNegative and Anxious  Type of Therapy: Individual Therapy  Treatment Goals addressed: Anxiety and Coping  Interventions: Solution Focused and Supportive  Summary: [name] is a 40 y.o. male who presents with hx of anx/ADHD. Today pt returns for in person session after virtual session d/t illness last session. Pt continues to have a lingering cough. Pt is noted to be excessively restless with leg bouncing, rapid speech, excessively profane. Assessment of meds reveals pt went off meds for almost a month and just resumed them 4 days ago. He states he allowed himself to run out while sick and was negligent about getting them filled once better. He admits he also was not managing his DM very well. LCSW assisted to process consequences and encouraged pt to not allow gaps in meds or DM management d/t high risk of wound issues on BLE. Pt reports he may have to have a surgery to address his achellis tendon if he cannot get it loosened with recommended exercises. Pt strongly encouraged to follow exercise regimen to avoid surgery. LCSW facilitated discussion of coping with the holidays. Pt denies other worries or concerns to address today. LCSW reviewed poc including scheduling prior to close of session. Pt states appreciation for care.     Suicidal/Homicidal: Nowithout intent/plan  Therapist Response: Pt mostly receptive to care.  Plan: Return again in ~4 weeks.  Diagnosis: Axis I: Generalized Anxiety Disorder   Pax Sink, LCSW 05/08/2021

## 2021-05-29 ENCOUNTER — Other Ambulatory Visit (HOSPITAL_COMMUNITY): Payer: Self-pay

## 2021-05-29 ENCOUNTER — Other Ambulatory Visit: Payer: Self-pay

## 2021-05-29 ENCOUNTER — Ambulatory Visit: Payer: Self-pay | Admitting: Orthopedic Surgery

## 2021-06-05 ENCOUNTER — Encounter: Payer: Self-pay | Admitting: Internal Medicine

## 2021-06-05 ENCOUNTER — Other Ambulatory Visit: Payer: Self-pay

## 2021-06-05 ENCOUNTER — Ambulatory Visit: Payer: Self-pay | Attending: Internal Medicine | Admitting: Internal Medicine

## 2021-06-05 DIAGNOSIS — E785 Hyperlipidemia, unspecified: Secondary | ICD-10-CM

## 2021-06-05 DIAGNOSIS — E1169 Type 2 diabetes mellitus with other specified complication: Secondary | ICD-10-CM

## 2021-06-05 DIAGNOSIS — Z2821 Immunization not carried out because of patient refusal: Secondary | ICD-10-CM

## 2021-06-05 NOTE — Progress Notes (Signed)
Virtual Visit via Video Note  I connected with [name] on 06/05/2021 at 11:46 AM by a video enabled telemedicine application and verified that I am speaking with the correct person using two identifiers.  Location: Patient: parked car Provider: office   I discussed the limitations of evaluation and management by telemedicine and the availability of in person appointments. The patient expressed understanding and agreed to proceed.  History of Present Illness: Pt with hx of DM type 2, HL, HTN (off meds x 15 yrs) obesity, DM foot ulcers, amputation LT big toe (02/2020), MDD, obesity, Patient last seen 01/2021.  This visit is for chronic disease management.  DM/morbid obesity: Patient is on Trulicity, NovoLog 16/60 mix 39 units twice a day and metformin 500 mg twice a day.  He reports that he is not as consistent in taking morning dose of insulin and metformin because he forgets.  Consistent with taking Trulicity. -Checks blood sugars but not every day.  Range has been 130-200.  Last A1c was 7.7. Eating habits are about the same.  He tries to eat as healthy as he can afford.  Does not feel that he has lost any weight.  Not getting in any exercise because he is not able to put much pressure on the right foot where he still has and also on the plantar surface of the big toe.  He is continue to see Dr. Sharol Given for this.  Patient states that the ulcer has not changed much.  He was given felt doughnut to wear around the ulcer when he is wearing shoes to help keep the pressure off of that area.  He states he has been using it. -Overdue for eye exam.  He is uninsured.  HL: Reports compliance with taking Pravachol.  HM: He has had 2 COVID 19 vaccines.  Due for pneumonia vaccine but he declines.   Outpatient Encounter Medications as of 06/05/2021  Medication Sig   atomoxetine (STRATTERA) 80 MG capsule Take 1 capsule (80 mg total) by mouth daily.   blood glucose meter kit and supplies Dispense  based on patient and insurance preference. Use up to four times daily as directed. (FOR ICD-10 E10.9, E11.9).   Dulaglutide (TRULICITY) 1.5 YT/0.1SW SOPN Inject 1.5 mg into the skin once a week.   insulin aspart protamine - aspart (NOVOLOG 70/30 MIX) (70-30) 100 UNIT/ML FlexPen Inject 39 Units into the skin 2 (two) times daily.   Insulin Pen Needle (PEN NEEDLES) 31G X 8 MM MISC UAD   metFORMIN (GLUCOPHAGE) 500 MG tablet Take 2 tablets (1,000 mg total) by mouth 2 (two) times daily with a meal.   nystatin cream (MYCOSTATIN) Apply topically 2 (two) times daily. (Patient not taking: Reported on 01/30/2021)   pravastatin (PRAVACHOL) 40 MG tablet Take 1 tablet (40 mg total) by mouth daily.   sertraline (ZOLOFT) 50 MG tablet Take 1 tablet (50 mg total) by mouth daily.   [DISCONTINUED] doxycycline (VIBRA-TABS) 100 MG tablet Take 1 tablet (100 mg total) by mouth 2 (two) times daily.   [DISCONTINUED] sulfamethoxazole-trimethoprim (BACTRIM DS) 800-160 MG tablet Take 1 tablet by mouth 2 (two) times daily.   No facility-administered encounter medications on file as of 06/05/2021.      Observations/Objective: Patient is sitting in his parked car and appears in NAD. Results for orders placed or performed in visit on 02/18/21  Uric acid  Result Value Ref Range   Uric Acid, Serum 5.1 4.0 - 8.0 mg/dL   Lab Results  Component  Value Date   HGBA1C 7.7 (A) 12/30/2020     Chemistry      Component Value Date/Time   NA 138 10/28/2020 1032   K 4.4 10/28/2020 1032   CL 99 10/28/2020 1032   CO2 23 10/28/2020 1032   BUN 11 10/28/2020 1032   CREATININE 0.85 10/28/2020 1032      Component Value Date/Time   CALCIUM 9.6 10/28/2020 1032   ALKPHOS 102 10/28/2020 1032   AST 14 10/28/2020 1032   ALT 17 10/28/2020 1032   BILITOT 0.3 10/28/2020 1032      Assessment and Plan: 1. Type 2 diabetes mellitus with morbid obesity ([place]) Discussed ways to help him remember to take the morning dose of insulin and  metformin.  He will set an alarm on his clock to remind him.  He will come to the lab to have A1c done with chemistry.  Encouraged him to get eye exam done when he is able to afford. - Hemoglobin A1c; Future - Comprehensive metabolic panel; Future  2. Hyperlipidemia due to type 2 diabetes mellitus (HCC) Continue Pravachol. - Lipid panel; Future  3. Pneumococcal vaccination declined Recommended.  Patient declined.   Follow Up Instructions: 4 mths   I discussed the assessment and treatment plan with the patient. The patient was provided an opportunity to ask questions and all were answered. The patient agreed with the plan and demonstrated an understanding of the instructions.   The patient was advised to call back or seek an in-person evaluation if the symptoms worsen or if the condition fails to improve as anticipated.  I spent 15 minutes dedicated to the care of this patient on the date of this encounter to include previsit review of of my last note, face-to-face time with patient discussing diagnosis and management and post visit entering of orders.    Karle Plumber, MD

## 2021-06-06 ENCOUNTER — Telehealth: Payer: Self-pay

## 2021-06-06 LAB — COMPREHENSIVE METABOLIC PANEL
ALT: 19 IU/L (ref 0–44)
AST: 17 IU/L (ref 0–40)
Albumin/Globulin Ratio: 1.3 (ref 1.2–2.2)
Albumin: 4.6 g/dL (ref 4.0–5.0)
Alkaline Phosphatase: 131 IU/L — ABNORMAL HIGH (ref 44–121)
BUN/Creatinine Ratio: 11 (ref 9–20)
BUN: 9 mg/dL (ref 6–24)
Bilirubin Total: 0.3 mg/dL (ref 0.0–1.2)
CO2: 21 mmol/L (ref 20–29)
Calcium: 9.3 mg/dL (ref 8.7–10.2)
Chloride: 96 mmol/L (ref 96–106)
Creatinine, Ser: 0.83 mg/dL (ref 0.76–1.27)
Globulin, Total: 3.6 g/dL (ref 1.5–4.5)
Glucose: 510 mg/dL (ref 70–99)
Potassium: 4.4 mmol/L (ref 3.5–5.2)
Sodium: 136 mmol/L (ref 134–144)
Total Protein: 8.2 g/dL (ref 6.0–8.5)
eGFR: 112 mL/min/{1.73_m2} (ref >59)

## 2021-06-06 LAB — LIPID PANEL
Chol/HDL Ratio: 5.7 ratio — ABNORMAL HIGH (ref 0.0–5.0)
Cholesterol, Total: 183 mg/dL (ref 100–199)
HDL: 32 mg/dL — ABNORMAL LOW (ref >39)
LDL Chol Calc (NIH): 107 mg/dL — ABNORMAL HIGH (ref 0–99)
Triglycerides: 252 mg/dL — ABNORMAL HIGH (ref 0–149)
VLDL Cholesterol Cal: 44 mg/dL — ABNORMAL HIGH (ref 5–40)

## 2021-06-06 LAB — HEMOGLOBIN A1C
Est. average glucose Bld gHb Est-mCnc: 272 mg/dL
Hgb A1c MFr Bld: 11.1 % — ABNORMAL HIGH (ref 4.8–5.6)

## 2021-06-06 NOTE — Telephone Encounter (Signed)
Pt has been scheduled and reminder has been mailed.  

## 2021-06-06 NOTE — Telephone Encounter (Signed)
-----   Message from Marcine Matar, MD sent at 06/05/2021 11:01 AM EST ----- Follow-up with me in 4 months in person.

## 2021-06-09 ENCOUNTER — Telehealth: Payer: Self-pay

## 2021-06-09 NOTE — Telephone Encounter (Signed)
Pt was called and a VM was left informing patient to contact office for his upcoming appointments.  Pt has been sent a Mychart message as well.

## 2021-06-09 NOTE — Telephone Encounter (Signed)
-----   Message from Marcine Matar, MD sent at 06/06/2021  7:20 AM EST ----- Please give patient an appointment with Franky Macho in 2 to 3 weeks for recheck of diabetes.

## 2021-06-23 ENCOUNTER — Ambulatory Visit (INDEPENDENT_AMBULATORY_CARE_PROVIDER_SITE_OTHER): Payer: No Payment, Other | Admitting: Licensed Clinical Social Worker

## 2021-06-23 ENCOUNTER — Other Ambulatory Visit: Payer: Self-pay

## 2021-06-23 DIAGNOSIS — F411 Generalized anxiety disorder: Secondary | ICD-10-CM | POA: Diagnosis not present

## 2021-06-24 NOTE — Progress Notes (Signed)
° °  THERAPIST PROGRESS NOTE  Session Time: 32 min  Participation Level: Active  Behavioral Response: CasualAlertAnxious  Type of Therapy: Individual Therapy  Treatment Goals addressed: Anxiety and Coping  Interventions: Solution Focused and Supportive  Summary: [name] is a 41 y.o. male who presents with hx of GAD, ADHD.  Today patient returns for in person session.  LCSW assessed for status of medications.  Patient reports he is taking medications as prescribed which is evident in patient's thoughts and speech flow as session continues.  Patient is noted to come in with an orthopedic shoe on today.  Assessment reveals patient's toe wound has declined again and the orthopedic shoe is more comfortable for him.  Ahmad continues to be seen very regularly by Dr. Lajoyce Corners for wound management.  When asked, patient has no update on what may happen related to his Achilles tendon.  He reports he is trying to keep up with his exercises to loosen it so it will not require surgery.  LCSW provided encouragement for him to keep up with exercises.  Assessment for any new goals patient has for counseling this year reveals patient saying counseling helps him keep on track and maintain a schedule.  He also reports a goal of losing weight.  Weight loss goal was addressed in detail with education on diet and exercise.  Patient verbalizes understanding.  Patient reports he has also found some free computer classes online which he intends to stay engaged with.  LCSW assessed for new worries or concerns.  Patient reports a concern related to his cousin who he feels has a drinking problem.  Jerran provides details including the fact that his cousin's brother died approximately a year ago and he has had a very hard time with it.  LCSW provided education and referral on hospice grief counseling center.  Patient states intent to have a sitdown talk with his cousin and will relay the information provided.  Patient  denies other worries or concerns. LCSW reviewed poc including scheduling prior to close of session. Pt states appreciation for care.   Suicidal/Homicidal: Nowithout intent/plan  Therapist Response: Pt receptive to care.  Plan: Return again in ~4 weeks.  Diagnosis: Axis I: Generalized Anxiety Disorder  Tucker Sink, LCSW 06/24/2021

## 2021-06-26 ENCOUNTER — Ambulatory Visit (INDEPENDENT_AMBULATORY_CARE_PROVIDER_SITE_OTHER): Payer: Self-pay | Admitting: Orthopedic Surgery

## 2021-06-26 ENCOUNTER — Encounter: Payer: Self-pay | Admitting: Orthopedic Surgery

## 2021-06-26 ENCOUNTER — Other Ambulatory Visit: Payer: Self-pay

## 2021-06-26 DIAGNOSIS — M869 Osteomyelitis, unspecified: Secondary | ICD-10-CM

## 2021-06-26 DIAGNOSIS — L97511 Non-pressure chronic ulcer of other part of right foot limited to breakdown of skin: Secondary | ICD-10-CM

## 2021-06-26 NOTE — Progress Notes (Signed)
Office Visit Note   Patient: [name]           Date of Birth: 01-25-1981           MRN: [id number] Visit Date: 06/26/2021              Requested by: Ladell Pier, MD [address] [place],  [place] [address] PCP: Ladell Pier, MD  Chief Complaint  Patient presents with   Right Foot - Wound Check    Ulcer check of right GT      HPI: Patient is a 41 year old gentleman who presents in follow-up for chronic ulcer beneath the right great toe IP joint.  Patient has been working on Achilles stretching and has had a felt relieving donut in a postoperative shoe to relieve pressure from the ulcer.  Patient states that at times the ulcer is more macerated he states he has noticed no significant improvement.  Assessment & Plan: Visit Diagnoses:  1. Osteomyelitis of great toe of right foot ([place])   2. Ulcer of toe of right foot, limited to breakdown of skin ([place])     Plan: With the swelling and redness of the toe with the deep ulcer will obtain an MRI scan to evaluate for osteomyelitis.  We will also check his uric acid patient has some redness around the index finger PIP joint.  Follow-Up Instructions: Return in about 2 weeks (around 07/10/2021).   Ortho Exam  Patient is alert, oriented, no adenopathy, well-dressed, normal affect, normal respiratory effort. Examination patient has thick callus and a deep ulcer beneath the IP joint of the right great toe.  After informed consent a 10 blade knife was used to debride the skin and soft tissue back to healthy viable tissue the ulcer is 2 cm in diameter prior to debridement 3 cm in diameter after debridement and 5 mm deep.  There is some redness around the PIP joint silver nitrate was used for hemostasis and the ulcer did not probe to bone.  Examination of his left hand patient has some redness over the radial border of the PIP joint he has good range of motion of the finger no evidence of a flexor or extensor tenosynovitis  no clinical signs of an abscess within the joint.  We will order a uric acid level.  Patient's most recent hemoglobin A1c is 11.1.  Imaging: No results found. No images are attached to the encounter.  Labs: Lab Results  Component Value Date   HGBA1C 11.1 (H) 06/05/2021   HGBA1C 7.7 (A) 12/30/2020   HGBA1C 9.1 (A) 09/27/2020   ESRSEDRATE 48 (H) 03/21/2020   ESRSEDRATE 81 (H) 03/05/2020   CRP 5.6 03/21/2020   CRP 9.0 (H) 03/05/2020   LABURIC 5.1 02/18/2021   REPTSTATUS 03/09/2020 FINAL 03/04/2020   CULT  03/04/2020    NO GROWTH 5 DAYS Performed at [facility], [place] [address], [place], [place] [address]    LABORGA GROUP B STREP(S.AGALACTIAE)ISOLATED 03/02/2020     Lab Results  Component Value Date   ALBUMIN 4.6 06/05/2021   ALBUMIN 4.6 10/28/2020   ALBUMIN 4.3 03/26/2020    No results found for: MG No results found for: VD25OH  No results found for: PREALBUMIN CBC EXTENDED Latest Ref Rng & Units 08/09/2020 03/08/2020 03/07/2020  WBC 4.0 - 10.5 K/uL 14.8(H) 6.8 7.4  RBC 4.22 - 5.81 MIL/uL 5.59 4.71 4.74  HGB 13.0 - 17.0 g/dL 15.5 12.9(L) 12.7(L)  HCT 39.0 - 52.0 %  46.7 39.7 39.6  PLT 150 - 400 K/uL 275 349 322  NEUTROABS 1.7 - 7.7 K/uL - 3.9 4.8  LYMPHSABS 0.7 - 4.0 K/uL - 1.9 1.8     There is no height or weight on file to calculate BMI.  Orders:  Orders Placed This Encounter  Procedures   MR Foot Right w/o contrast   Uric acid   No orders of the defined types were placed in this encounter.    Procedures: No procedures performed  Clinical Data: No additional findings.  ROS:  All other systems negative, except as noted in the HPI. Review of Systems  Objective: Vital Signs: There were no vitals taken for this visit.  Specialty Comments:  No specialty comments available.  PMFS History: Patient Active Problem List   Diagnosis Date Noted   Attention deficit hyperactivity disorder (ADHD), predominantly inattentive type 07/30/2020   Anxiety  with depression 06/07/2020   Hyperlipidemia due to type 2 diabetes mellitus ([place]) 03/27/2020   Status post amputation of left great toe ([place]) 03/26/2020   Influenza vaccination declined 03/26/2020   23-polyvalent pneumococcal polysaccharide vaccine declined 03/26/2020   COVID-19 vaccine series completed 03/26/2020   Moderate major depression, single episode ([place]) 03/26/2020   Anaerobic bacteremia 03/07/2020   Major depressive disorder, recurrent episode, moderate ([place]) 03/04/2020   Abscess of great toe, left    Sepsis due to group B Streptococcus with acute renal failure (HCC)    Cellulitis of left toe 03/03/2020   Diabetic ketoacidosis ([place]) 03/03/2020   DKA (diabetic ketoacidosis) ([place]) 03/03/2020   Ulcer of right foot with fat layer exposed ([place]) 06/06/2019   Non-pressure chronic ulcer of other part of left foot limited to breakdown of skin ([place]) 06/06/2019   Uncontrolled type 2 diabetes mellitus with hyperglycemia ([place]) 05/02/2018   Diabetic ulcer of left great toe ([place]) 07/30/2017   Venous insufficiency of both lower extremities 07/30/2017   Obesity, Class III, BMI 40-49.9 (morbid obesity) ([place]) 07/30/2017   Essential hypertension 09/03/2016   Past Medical History:  Diagnosis Date   ADHD    Anxiety    Bipolar disorder ([place])    Depression    Diabetes mellitus without complication ([place])    type 2   HLD (hyperlipidemia)    Hypertension    no meds    Family History  Problem Relation Age of Onset   Diabetes Mother    Hyperlipidemia Father    Hypertension Father     Past Surgical History:  Procedure Laterality Date   AMPUTATION Left 03/06/2020   Procedure: LEFT GREAT TOE AMPUTATION;  Surgeon: Newt Minion, MD;  Location: [facility];  Service: Orthopedics;  Laterality: Left;   EYE SURGERY Bilateral    lasik   WISDOM TOOTH EXTRACTION     Social History   Occupational History   Occupation: Unemployed  Tobacco Use   Smoking status: Never   Smokeless tobacco: Never   Vaping Use   Vaping Use: Never used  Substance and Sexual Activity   Alcohol use: No    Alcohol/week: 0.0 standard drinks   Drug use: No   Sexual activity: Not on file

## 2021-06-27 ENCOUNTER — Ambulatory Visit: Payer: Self-pay | Admitting: Pharmacist

## 2021-06-27 LAB — URIC ACID: Uric Acid, Serum: 4.6 mg/dL (ref 4.0–8.0)

## 2021-07-09 ENCOUNTER — Other Ambulatory Visit: Payer: Self-pay

## 2021-07-09 ENCOUNTER — Ambulatory Visit
Admission: RE | Admit: 2021-07-09 | Discharge: 2021-07-09 | Disposition: A | Discharge status: Home / Self Care | Payer: Self-pay | Source: Ambulatory Visit | Attending: Orthopedic Surgery | Admitting: Orthopedic Surgery

## 2021-07-09 DIAGNOSIS — M869 Osteomyelitis, unspecified: Secondary | ICD-10-CM

## 2021-07-09 IMAGING — MR MR FOOT*R* W/O CM
6 series · 40 of 40 positions shown · non-contrast
Comparison: [DATE]

CLINICAL DATA: Wound on the plantar aspect of the right great toe
for 2 years.

EXAM:
MRI OF THE RIGHT FOREFOOT WITHOUT CONTRAST
TECHNIQUE: Multiplanar, multisequence MR imaging of the right forefoot was
performed. No intravenous contrast was administered.

[Series 4: T1 · coronal · right · 3.0mm · 0.38mm/px · 8 of 49 slices shown (1 of 2)]
[im 1/49]
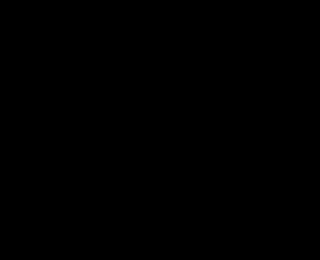
[im 7/49]
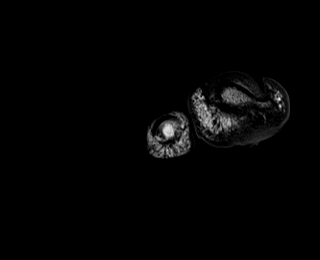
[im 14/49]
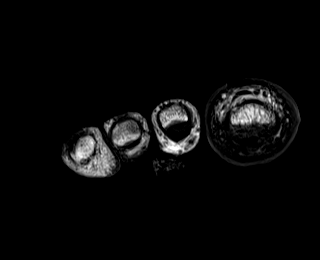
[im 21/49]
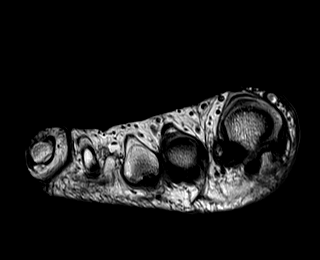
[im 28/49]
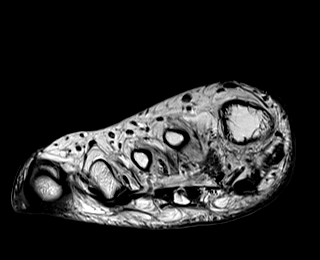
[im 35/49]
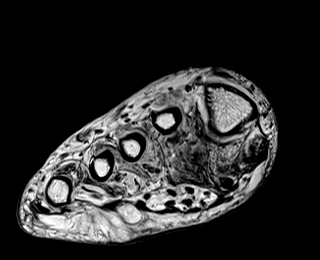
[im 42/49]
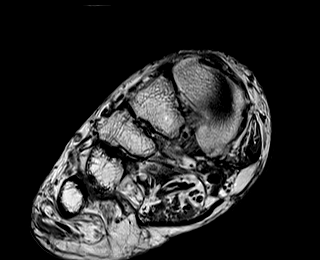
[im 49/49]
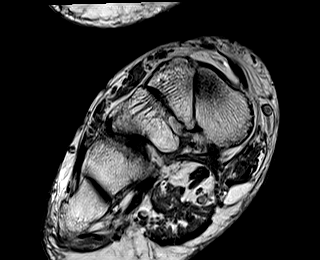

[Series 5: T2 fat-sat · coronal · right · 3.0mm · 0.38mm/px · 9 of 49 slices shown (1 of 2)]
[im 1/49]
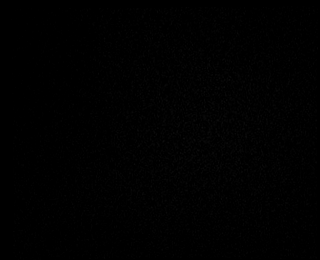
[im 7/49]
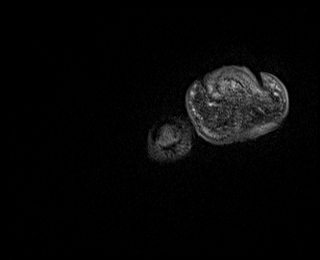
[im 13/49]
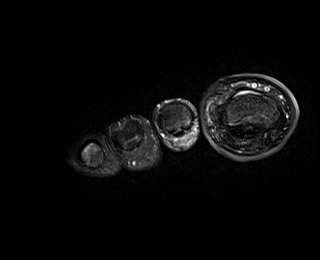
[im 19/49]
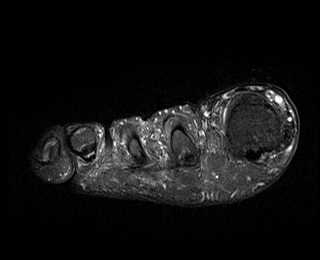
[im 25/49]
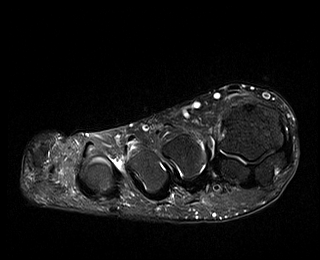
[im 31/49]
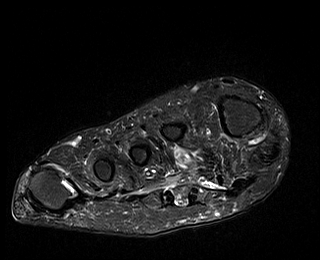
[im 37/49]
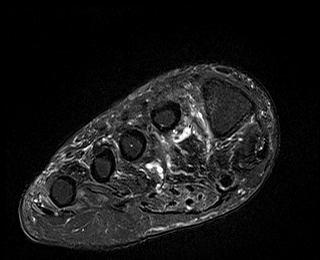
[im 43/49]
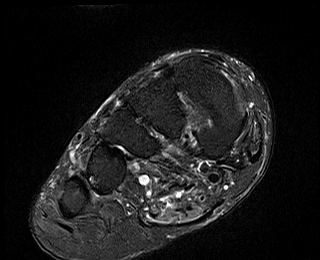
[im 49/49]
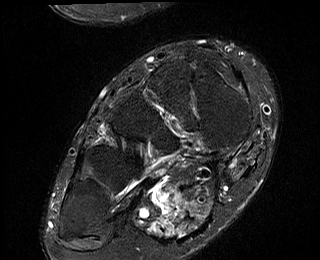

[Series 6: STIR · sagittal · right · 3.0mm · 0.70mm/px · 6 of 33 slices shown]
[im 1/33]
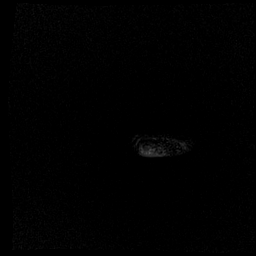
[im 7/33]
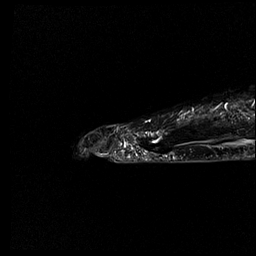
[im 13/33]
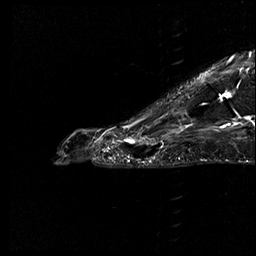
[im 20/33]
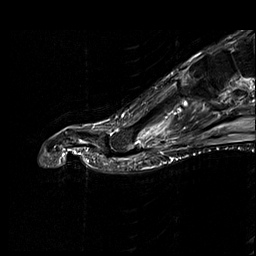
[im 26/33]
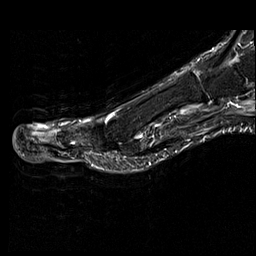
[im 33/33]
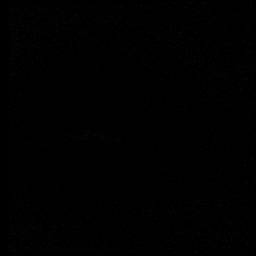

[Series 7: T1 · axial · right · 3.0mm · 0.47mm/px · z∈[-164,-88]mm · 4 of 23 slices shown (2 of 2)]
[im 1/23]
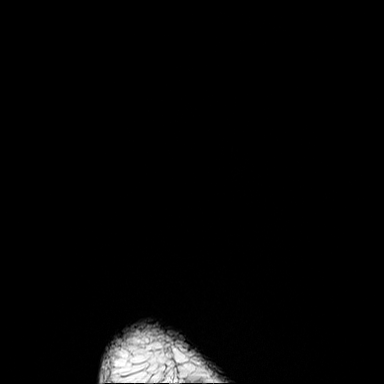
[im 8/23]
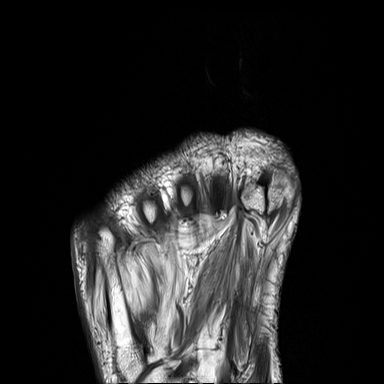
[im 15/23]
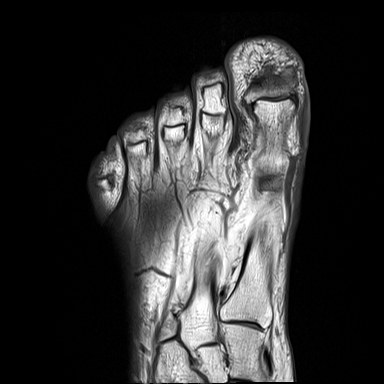
[im 23/23]
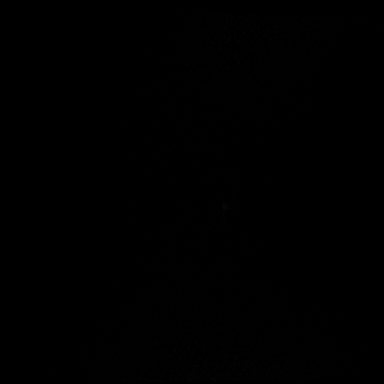

[Series 8: T2 fat-sat · axial · right · 3.0mm · 0.56mm/px · z∈[-164,-88]mm · 4 of 23 slices shown (2 of 2)]
[im 1/23]
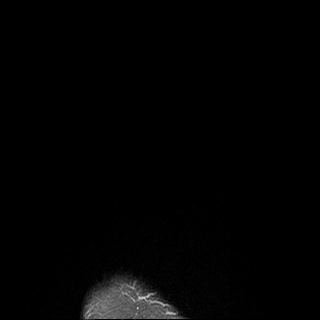
[im 8/23]
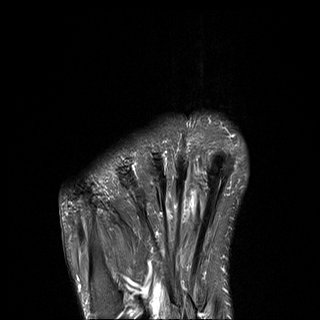
[im 15/23]
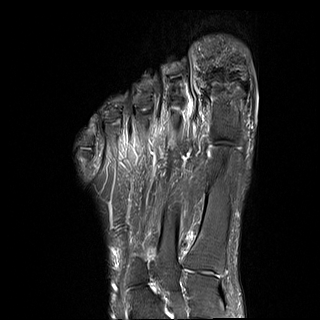
[im 23/23]
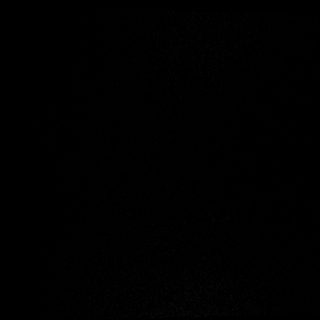

[Series 9: T1 fat-sat · coronal · non-contrast · right · 3.0mm · 0.38mm/px · 9 of 49 slices shown]
[im 1/49]
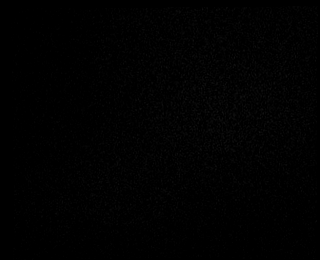
[im 7/49]
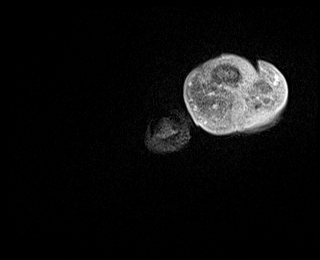
[im 13/49]
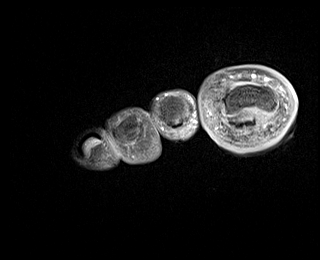
[im 19/49]
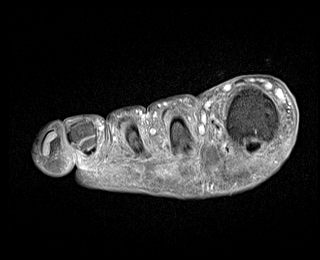
[im 25/49]
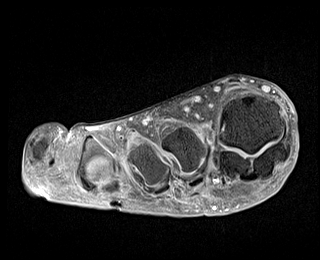
[im 31/49]
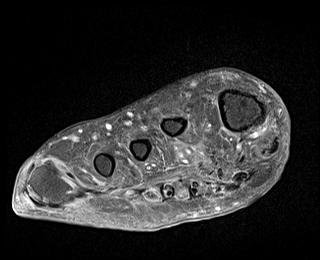
[im 37/49]
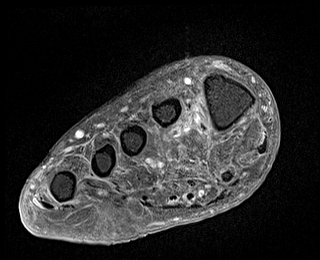
[im 43/49]
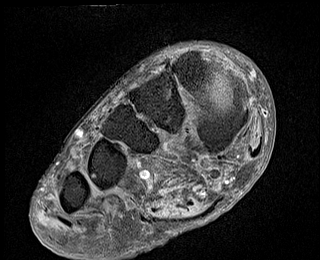
[im 49/49]
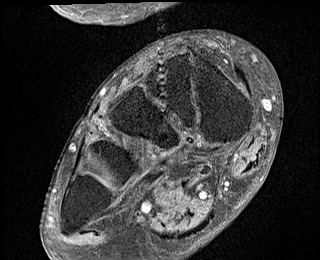

[40 of 40 positions shown; findings below may reference images not displayed]

FINDINGS: Bones/Joint/Cartilage

Soft tissue wound along the plantar aspect of the great toe. Severe
bone marrow edema throughout the first distal phalanx concerning for
osteomyelitis. No acute fracture or dislocation. No aggressive
osseous lesion. Normal alignment. No joint effusion.

Ligaments

Collateral ligaments are intact.  Lisfranc ligament is intact.

Muscles and Tendons

Flexor, peroneal and extensor compartment tendons are intact. Mild
T2 hyperintense signal in the plantar musculature likely neurogenic.

Soft tissue
No fluid collection or hematoma.  No soft tissue mass.
IMPRESSION: 1. Soft tissue wound along the plantar aspect of the great toe.
Severe bone marrow edema throughout the first distal phalanx
concerning for osteomyelitis. No drainable fluid collection to
suggest an abscess.

## 2021-07-10 ENCOUNTER — Ambulatory Visit: Payer: Self-pay | Attending: Internal Medicine | Admitting: Pharmacist

## 2021-07-10 ENCOUNTER — Other Ambulatory Visit: Payer: Self-pay

## 2021-07-10 DIAGNOSIS — E1165 Type 2 diabetes mellitus with hyperglycemia: Secondary | ICD-10-CM

## 2021-07-10 DIAGNOSIS — E1169 Type 2 diabetes mellitus with other specified complication: Secondary | ICD-10-CM

## 2021-07-10 MED ORDER — INSULIN ASPART PROT & ASPART (70-30 MIX) 100 UNIT/ML PEN
40.0000 [IU] | PEN_INJECTOR | Freq: Two times a day (BID) | SUBCUTANEOUS | 11 refills | Status: DC
  Filled 2021-07-10 – 2021-07-23 (×2): qty 15, 19d supply, fill #0

## 2021-07-10 MED ORDER — TRULICITY 1.5 MG/0.5ML ~~LOC~~ SOAJ
1.5000 mg | SUBCUTANEOUS | 2 refills | Status: DC
  Filled 2021-07-10 – 2021-07-23 (×2): qty 2, 28d supply, fill #0
  Filled 2021-08-27: qty 2, 28d supply, fill #1
  Filled 2021-09-24: qty 2, 28d supply, fill #2

## 2021-07-10 MED ORDER — METFORMIN HCL 500 MG PO TABS
1000.0000 mg | ORAL_TABLET | Freq: Two times a day (BID) | ORAL | 2 refills | Status: DC
  Filled 2021-07-10 – 2021-07-23 (×2): qty 120, 30d supply, fill #0
  Filled 2021-11-15: qty 120, 30d supply, fill #1

## 2021-07-10 NOTE — Progress Notes (Signed)
° ° °  S:    PCP: Dr. Laural Benes   Patient arrives in good spirits. Presents for diabetes evaluation, education, and management. Patient was referred and last seen by Primary Care Provider on 06/05/2021.   Family/Social History:  -FHx: DM, HTN, HLD -Tobacco: never smoker -Alcohol: none  Insurance coverage/medication affordability: self pay  Medication adherence denied. He does not take his morning doses of metformin or Novolog 70/30.  Current diabetes medications include: metformin 1000 mg BID (takes two 500mg  tablets BID), Novolog 70/30 40u BID, Trulicity 1.5 mg weekly (has to take with food in the morning - injects Thursday evenings)  Current hypertension medications include: none  Current hyperlipidemia medications include: pravastatin 40 mg daily   Patient denies hypoglycemic events.   Patient reported dietary habits: - Admits to dietary indiscretion  - Admits to drinking more Pepsi and sweets since his last visit  - Tells me he snacks on fruit - when injecting Trulicity on Thursdays he tells me it wears off by Tuesday  Patient-reported exercise habits: limited d/t foot ulcer    Patient denies nocturia (nighttime urination).  Patient denies neuropathy (nerve pain). Patient denies visual changes. Patient reports self foot exams.     O:  Lab Results  Component Value Date   HGBA1C 11.1 (H) 06/05/2021   There were no vitals filed for this visit.  Lipid Panel     Component Value Date/Time   CHOL 183 06/05/2021 1114   TRIG 252 (H) 06/05/2021 1114   HDL 32 (L) 06/05/2021 1114   CHOLHDL 5.7 (H) 06/05/2021 1114   LDLCALC 107 (H) 06/05/2021 1114   No meter with him today.  Reported home blood sugars fasting and post-prandial: mostly in the 130 range. Occasionally sees numbers in the 200s.   Clinical Atherosclerotic Cardiovascular Disease (ASCVD): No  The 10-year ASCVD risk score (Arnett DK, et al., 2019) is: 3.3%   Values used to calculate the score:     Age: 44 years      Sex: Male     Is Non-Hispanic African American: No     Diabetic: Yes     Tobacco smoker: No     Systolic Blood Pressure: 110 mmHg     Is BP treated: No     HDL Cholesterol: 32 mg/dL     Total Cholesterol: 183 mg/dL   A/P: Diabetes longstanding currently above goal. Patient is able to verbalize appropriate hypoglycemia management plan. Medication adherence appears suboptimal. Additionally, he admits to more dietary indiscretion over the past couple of months. He is now working to eliminate soda from his diet. No changes today. Instructed pt to work on medication adherence and follow-up in 1 month.  -Continue current regimen. -Continue same dose of metformin BID.  -Extensively discussed pathophysiology of diabetes, recommended lifestyle interventions, dietary effects on blood sugar control -Counseled on s/sx of and management of hypoglycemia -Next A1c anticipated 09/2021  Written patient instructions provided. Total time in face to face counseling 30 minutes. Follow up PCP Clinic Visit in 1 month.     10/2021, PharmD, Butch Penny, CPP Clinical Pharmacist [facility] [phone]

## 2021-07-14 ENCOUNTER — Ambulatory Visit: Payer: Self-pay | Admitting: Orthopedic Surgery

## 2021-07-17 ENCOUNTER — Other Ambulatory Visit: Payer: Self-pay

## 2021-07-23 ENCOUNTER — Telehealth (HOSPITAL_COMMUNITY): Payer: No Payment, Other | Admitting: Psychiatry

## 2021-07-23 ENCOUNTER — Other Ambulatory Visit: Payer: Self-pay

## 2021-07-24 ENCOUNTER — Ambulatory Visit (INDEPENDENT_AMBULATORY_CARE_PROVIDER_SITE_OTHER): Payer: No Payment, Other | Admitting: Licensed Clinical Social Worker

## 2021-07-24 ENCOUNTER — Other Ambulatory Visit: Payer: Self-pay

## 2021-07-24 DIAGNOSIS — F411 Generalized anxiety disorder: Secondary | ICD-10-CM

## 2021-07-25 ENCOUNTER — Other Ambulatory Visit: Payer: Self-pay

## 2021-07-28 NOTE — Progress Notes (Signed)
? ?  THERAPIST PROGRESS NOTE ? ?Session Time: 25 min ? ?Participation Level: Active ? ?Behavioral Response: CasualAlertAnxious ? ?Type of Therapy: Individual Therapy ? ?Treatment Goals addressed: anx/stressors/coping ? ?ProgressTowards Goals: Progressing minimally ? ?Interventions: Solution Focused and Supportive ? ?Summary: [name] is a 41 y.o. male who presents with hx of GAD, ADHD.  Today patient returns for in person session.  He is noted to still be wearing his orthopedic boot and states his wound on his foot is essentially the same.  He continues to follow-up with Dr. Lajoyce Corners on a regular basis.  LCSW assessed for status of patient taking his medications as prescribed.  Patient reports he is not taking medications as prescribed because he allowed his refills to run out.  Zaccheus advises he called everything and last night and should be able to pick up his medications today.  Patient reports he is sleeping fairly well right now but has increased distractibility, lack of energy and poor follow-through.  LCSW again provided education on the importance and benefits of staying on medications as prescribed without missing doses and without gaps.  Patient verbalizes understanding.  LCSW assessed for patient following up with his cousin he stated concerns about last session due to cousin's drinking.  Patient advises other family members have intervened and so he has elected not to address this with his cousin for now.  LCSW assessed for status of the computer classes he mentioned he intended to take.  Patient reports he is still "checking them out" and has not followed through with attending/joining any classes.  Patient continues to assist with transportation for his niece/nephews to and from school, he continues with dog care and spends the majority of his time online.  [name] denies any new worries or concerns. LCSW reviewed poc including scheduling prior to close of session. Pt states appreciation for  care.  ? ?Suicidal/Homicidal: Nowithout intent/plan ? ?Therapist Response: Pt mostly receptive to care. ? ?Plan: Return again in ~6 weeks. ? ?Diagnosis: GAD (generalized anxiety disorder) ? ?Collaboration of Care: Other None deemed necessary this session. ? ?Patient/Guardian was advised Release of Information must be obtained prior to any record release in order to collaborate their care with an outside provider. Patient/Guardian was advised if they have not already done so to contact the registration department to sign all necessary forms in order for [name] to release information regarding their care.  ? ?Consent: Patient/Guardian gives verbal consent for treatment and assignment of benefits for services provided during this visit. Patient/Guardian expressed understanding and agreed to proceed.  ? ?Hardin Sink, LCSW ?07/28/2021 ? ?

## 2021-07-29 ENCOUNTER — Telehealth (INDEPENDENT_AMBULATORY_CARE_PROVIDER_SITE_OTHER): Payer: No Payment, Other | Admitting: Psychiatry

## 2021-07-29 DIAGNOSIS — F900 Attention-deficit hyperactivity disorder, predominantly inattentive type: Secondary | ICD-10-CM

## 2021-07-29 DIAGNOSIS — F321 Major depressive disorder, single episode, moderate: Secondary | ICD-10-CM

## 2021-07-29 MED ORDER — SERTRALINE HCL 50 MG PO TABS: 50.0000 mg | ORAL_TABLET | Freq: Every day | ORAL | 3 refills | Status: DC

## 2021-07-29 MED ORDER — ATOMOXETINE HCL 80 MG PO CAPS: 80.0000 mg | ORAL_CAPSULE | Freq: Every day | ORAL | 3 refills | Status: DC

## 2021-07-29 NOTE — Progress Notes (Signed)
[facility] MD/PA/NP OP Progress Note  07/29/2021 9:38 AM [name]  MRN:  [id number]  Virtual Visit via Video Note  I connected with [name] on 07/29/21 at  9:30 AM EST by a video enabled telemedicine application and verified that I am speaking with the correct person using two identifiers.  Location: Patient: home Provider: off site   I discussed the limitations of evaluation and management by telemedicine and the availability of in person appointments. The patient expressed understanding and agreed to proceed.   I discussed the assessment and treatment plan with the patient. The patient was provided an opportunity to ask questions and all were answered. The patient agreed with the plan and demonstrated an understanding of the instructions.   The patient was advised to call back or seek an in-person evaluation if the symptoms worsen or if the condition fails to improve as anticipated.  I provided 5 minutes of non-face-to-face time during this encounter.   Franne Grip, NP   Chief Complaint: Medication Management  HPI: [name] is a 41 year old male presenting to [facility] for a follow up psychiatric evaluation. He has a psychiatric history of major depressive disorder, anxiety and ADHD. Patient's symptoms are managed with sertraline 50 mg daily and strattera 80 mg daily.  Patient reports medication compliance and effectiveness.  He denies adverse medication reactions.  He denies a need for medication dosage adjustment today. Patient is alert and oriented x4, calm, pleasant and willing to engage.  He reports a decreased appetite due to being prescribed Trulicity, but he states he is not losing any weight yet.  Patient reports fair sleep cycle.  Patient reports that he streams online and loses track of time which is causing a disturbance in his sleep cycle.  Education provided and patient acknowledged understanding that bluelight from  cell phones and televisions will inhibit the production of melatonin which helps with the timing of the [place] rhythm and sleep. He denies the need for a sleep aid today. Patient denies suicidal or homicidal ideations, paranoia, delusional thought, or auditory or visual hallucinations.    Visit Diagnosis:    ICD-10-CM   1. Attention deficit hyperactivity disorder (ADHD), predominantly inattentive type  F90.0 atomoxetine (STRATTERA) 80 MG capsule    2. Moderate major depression, single episode (HCC)  F32.1 sertraline (ZOLOFT) 50 MG tablet      Past Psychiatric History: major depressive disorder, ADHD, anxiety  Past Medical History:  Past Medical History:  Diagnosis Date   ADHD    Anxiety    Bipolar disorder ([place])    Depression    Diabetes mellitus without complication ([place])    type 2   HLD (hyperlipidemia)    Hypertension    no meds    Past Surgical History:  Procedure Laterality Date   AMPUTATION Left 03/06/2020   Procedure: LEFT GREAT TOE AMPUTATION;  Surgeon: Newt Minion, MD;  Location: [facility];  Service: Orthopedics;  Laterality: Left;   EYE SURGERY Bilateral    lasik   WISDOM TOOTH EXTRACTION      Family Psychiatric History: none known  Family History:  Family History  Problem Relation Age of Onset   Diabetes Mother    Hyperlipidemia Father    Hypertension Father     Social History:  Social History   Socioeconomic History   Marital status: Single    Spouse name: Not on file   Number of children: Not on file   Years of  education: Not on file   Highest education level: Not on file  Occupational History   Occupation: Unemployed  Tobacco Use   Smoking status: Never   Smokeless tobacco: Never  Vaping Use   Vaping Use: Never used  Substance and Sexual Activity   Alcohol use: No    Alcohol/week: 0.0 standard drinks   Drug use: No   Sexual activity: Not on file  Other Topics Concern   Not on file  Social History Narrative   Live with parents in  [place].    Social Determinants of Health   Financial Resource Strain: Not on file  Food Insecurity: Not on file  Transportation Needs: Not on file  Physical Activity: Not on file  Stress: Not on file  Social Connections: Not on file    Allergies:  Allergies  Allergen Reactions   Milk-Related Compounds Diarrhea   Iodine     Patient endorses unspecified reaction to iodine in past.. Just a one time thing.    Metabolic Disorder Labs: Lab Results  Component Value Date   HGBA1C 11.1 (H) 06/05/2021   MPG 291.96 03/03/2020   No results found for: PROLACTIN Lab Results  Component Value Date   CHOL 183 06/05/2021   TRIG 252 (H) 06/05/2021   HDL 32 (L) 06/05/2021   CHOLHDL 5.7 (H) 06/05/2021   LDLCALC 107 (H) 06/05/2021   LDLCALC 122 (H) 10/28/2020   No results found for: TSH  Therapeutic Level Labs: No results found for: LITHIUM No results found for: VALPROATE No components found for:  CBMZ  Current Medications: Current Outpatient Medications  Medication Sig Dispense Refill   atomoxetine (STRATTERA) 80 MG capsule Take 1 capsule (80 mg total) by mouth daily. 30 capsule 3   blood glucose meter kit and supplies Dispense based on patient and insurance preference. Use up to four times daily as directed. (FOR ICD-10 E10.9, E11.9). 1 each 0   Dulaglutide (TRULICITY) 1.5 ZC/5.8IF SOPN Inject 1.5 mg into the skin once a week. 2 mL 2   insulin aspart protamine - aspart (NOVOLOG 70/30 MIX) (70-30) 100 UNIT/ML FlexPen Inject 40 Units into the skin 2 (two) times daily. 15 mL 11   Insulin Pen Needle (PEN NEEDLES) 31G X 8 MM MISC UAD 100 each 6   metFORMIN (GLUCOPHAGE) 500 MG tablet Take 2 tablets (1,000 mg total) by mouth 2 (two) times daily with a meal. 120 tablet 2   nystatin cream (MYCOSTATIN) Apply topically 2 (two) times daily. (Patient not taking: Reported on 01/30/2021) 30 g 0   pravastatin (PRAVACHOL) 40 MG tablet Take 1 tablet (40 mg total) by mouth daily. 30 tablet 6    sertraline (ZOLOFT) 50 MG tablet Take 1 tablet (50 mg total) by mouth daily. 30 tablet 3   No current facility-administered medications for this visit.     Musculoskeletal: Strength & Muscle Tone:  n/a virtual visit Gait & Station:  n/a virtual visit Patient leans: N/A  Psychiatric Specialty Exam: Review of Systems  Psychiatric/Behavioral:  Positive for sleep disturbance. Negative for dysphoric mood, hallucinations, self-injury and suicidal ideas. The patient is not nervous/anxious and is not hyperactive.   All other systems reviewed and are negative.  There were no vitals taken for this visit.There is no height or weight on file to calculate BMI.  General Appearance: Fairly Groomed  Eye Contact:  Good  Speech:  Clear and Coherent  Volume:  Normal  Mood:  Euthymic  Affect:  Congruent  Thought Process:  Coherent  Orientation:  Full (Time, Place, and Person)  Thought Content: WDL   Suicidal Thoughts:  No  Homicidal Thoughts:  No  Memory:  Immediate;   Good Recent;   Good Remote;   Good  Judgement:  Good  Insight:  Good  Psychomotor Activity:  NA  Concentration:  Concentration: Good and Attention Span: Good  Recall:  Good  Fund of Knowledge: Good  Language: Good  Akathisia:  NA  Handed:  Right  AIMS (if indicated): not done  Assets:  Communication Skills Desire for Improvement  ADL's:  Intact  Cognition: WNL  Sleep:  fair   Screenings: GAD-7    Flowsheet Row Video Visit from 04/30/2021 in [facility] Office Visit from 01/30/2021 in [facility] Video Visit from 01/22/2021 in [facility] Video Visit from 10/23/2020 in [facility] Office Visit from 09/27/2020 in [facility]  Total GAD-7 Score _0 PHQ2-9    Flowsheet Row Counselor from 07/24/2021 in [facility] Video Visit from 04/30/2021 in  [facility] Office Visit from 01/30/2021 in [facility] Video Visit from 01/22/2021 in [facility] Video Visit from 10/23/2020 in [facility]  PHQ-2 Total Score _1 PHQ-9 Total Score _2 Flowsheet Row Counselor from 07/24/2021 in [facility] Video Visit from 10/23/2020 in [facility] Admission (Discharged) from 08/09/2020 in [facility] No Risk No Risk No Risk        Assessment and Plan:  HPI: [name] is a 41 year old male presenting to [facility] for a follow up psychiatric evaluation. He has a psychiatric history of major depressive disorder, anxiety and ADHD. Patient's symptoms are managed with sertraline 50 mg daily and strattera 80 mg daily.  Patient reports medication compliance and effectiveness.  He denies adverse medication reactions.  He denies a need for medication dosage adjustment today. No medication changes today. Medications refilled.  Collaboration of Care: Collaboration of Care: Medication Management AEB Medications e-scribed to patient's preferred pharmacy.  1. Attention deficit hyperactivity disorder (ADHD), predominantly inattentive type  - atomoxetine (STRATTERA) 80 MG capsule; Take 1 capsule (80 mg total) by mouth daily.  Dispense: 30 capsule; Refill: 3  2. Moderate major depression, single episode (HCC)  - sertraline (ZOLOFT) 50 MG tablet; Take 1 tablet (50 mg total) by mouth daily.  Dispense: 30 tablet; Refill: 3   Return to care in 3 months  Patient/Guardian was advised Release of Information must be obtained prior to any record release in order to collaborate their care with an outside provider. Patient/Guardian was advised if they have not already done so to contact the registration department to  sign all necessary forms in order for [name] to release information regarding their care.   Consent: Patient/Guardian gives verbal consent for treatment and assignment of benefits for services provided during this visit. Patient/Guardian expressed understanding and agreed to proceed.    Franne Grip, NP 07/29/2021, 9:38 AM

## 2021-08-08 ENCOUNTER — Other Ambulatory Visit: Payer: Self-pay

## 2021-08-08 ENCOUNTER — Ambulatory Visit: Payer: Self-pay | Attending: Family Medicine | Admitting: Pharmacist

## 2021-08-08 DIAGNOSIS — E1165 Type 2 diabetes mellitus with hyperglycemia: Secondary | ICD-10-CM

## 2021-08-08 MED ORDER — BASAGLAR KWIKPEN 100 UNIT/ML ~~LOC~~ SOPN
40.0000 [IU] | PEN_INJECTOR | Freq: Every day | SUBCUTANEOUS | 1 refills | Status: DC
  Filled 2021-08-08: qty 15, 37d supply, fill #0
  Filled 2021-09-24: qty 15, 37d supply, fill #1

## 2021-08-08 NOTE — Progress Notes (Signed)
? ? ?  S:    ?PCP: Dr. Wynetta Emery  ? ?Patient arrives in good spirits. Presents for diabetes evaluation, education, and management. Patient was referred and last seen by Primary Care Provider on 06/05/2021. I saw him last month and made no changes as he reported partial med adherence. Today, he tells me that he is only consistently taking 40 units BID of Novolog 70/30 2-3 days out of the week. Most days he only takes 40 units daily. He still struggles somewhat to get in his BID dose of metformin as well.  ? ?Family/Social History:  ?-FHx: DM, HTN, HLD ?-Tobacco: never smoker ?-Alcohol: none ? ?Insurance coverage/medication affordability: self pay ? ?Medication adherence denied. He does not take his morning doses of metformin or Novolog 70/30.  ?Current diabetes medications include: metformin 1000 mg BID (takes two 500mg  tablets BID), Novolog 0000000 0000000 BID, Trulicity 1.5 mg weekly (has to take with food in the morning - injects Thursday evenings)  ?Current hypertension medications include: none  ?Current hyperlipidemia medications include: pravastatin 40 mg daily  ? ?Patient denies hypoglycemic events.  ? ?Patient reported dietary habits: ?- Admits to dietary indiscretion  ?- Admits to drinking more Pepsi and sweets since his last visit  ?- Tells me he snacks on fruit - when injecting Trulicity on Thursdays he tells me it wears off by Tuesday ? ?Patient-reported exercise habits: limited d/t foot ulcer  ?  ?Patient denies nocturia (nighttime urination).  ?Patient denies neuropathy (nerve pain). ?Patient denies visual changes. ?Patient reports self foot exams.  ?  ? ?O:  ?Lab Results  ?Component Value Date  ? HGBA1C 11.1 (H) 06/05/2021  ? ?There were no vitals filed for this visit. ? ?Lipid Panel  ?   ?Component Value Date/Time  ? CHOL 183 06/05/2021 1114  ? TRIG 252 (H) 06/05/2021 1114  ? HDL 32 (L) 06/05/2021 1114  ? CHOLHDL 5.7 (H) 06/05/2021 1114  ? LDLCALC 107 (H) 06/05/2021 1114  ? ?No meter with him today.  ?Reported  home blood sugars fasting and post-prandial: reports wide range of 130 -250. ? ?Clinical Atherosclerotic Cardiovascular Disease (ASCVD): No  ?The 10-year ASCVD risk score (Arnett DK, et al., 2019) is: 3.3% ?  Values used to calculate the score: ?    Age: 61 years ?    Sex: Male ?    Is Non-Hispanic African American: No ?    Diabetic: Yes ?    Tobacco smoker: No ?    Systolic Blood Pressure: [id number] mmHg ?    Is BP treated: No ?    HDL Cholesterol: 32 mg/dL ?    Total Cholesterol: 183 mg/dL  ? ?A/P: ?Diabetes longstanding currently above goal. Patient is able to verbalize appropriate hypoglycemia management plan. Medication adherence appears suboptimal. Will change to once daily insulin to improve adherence.  ?-Change mixed insulin to Basaglar 40 units daily. Pt may increase by 2 units q3days if fasting CBGs remain above 130. Max 50 units.  ?-Continue metformin and Trulicity at current dosing.  ?-Continue same dose of metformin BID.  ?-Extensively discussed pathophysiology of diabetes, recommended lifestyle interventions, dietary effects on blood sugar control ?-Counseled on s/sx of and management of hypoglycemia ?-Next A1c anticipated 09/2021 ? ?Written patient instructions provided. Total time in face to face counseling 30 minutes. Follow up PCP Clinic Visit in 3 weeks.    ? ?Benard Halsted, PharmD, BCACP, CPP ?Clinical Pharmacist ?[facility] ?[phone] ? ? ?

## 2021-08-11 ENCOUNTER — Other Ambulatory Visit: Payer: Self-pay

## 2021-08-18 ENCOUNTER — Telehealth (HOSPITAL_COMMUNITY): Payer: Self-pay | Admitting: Licensed Clinical Social Worker

## 2021-08-18 NOTE — Telephone Encounter (Signed)
LCSW placed call to pt to inform him of this clinician's resignation from [facility]. LCSW provided information on transition plan for new counselor. Pt verbalizes understanding. Wished pt well. ?

## 2021-08-27 ENCOUNTER — Other Ambulatory Visit: Payer: Self-pay

## 2021-08-29 ENCOUNTER — Ambulatory Visit: Payer: Self-pay | Admitting: Pharmacist

## 2021-08-29 ENCOUNTER — Other Ambulatory Visit: Payer: Self-pay

## 2021-09-10 ENCOUNTER — Ambulatory Visit (INDEPENDENT_AMBULATORY_CARE_PROVIDER_SITE_OTHER): Payer: No Payment, Other | Admitting: Licensed Clinical Social Worker

## 2021-09-10 ENCOUNTER — Encounter (HOSPITAL_COMMUNITY): Payer: Self-pay

## 2021-09-10 DIAGNOSIS — F4323 Adjustment disorder with mixed anxiety and depressed mood: Secondary | ICD-10-CM | POA: Insufficient documentation

## 2021-09-10 DIAGNOSIS — F900 Attention-deficit hyperactivity disorder, predominantly inattentive type: Secondary | ICD-10-CM | POA: Diagnosis not present

## 2021-09-10 DIAGNOSIS — F319 Bipolar disorder, unspecified: Secondary | ICD-10-CM | POA: Insufficient documentation

## 2021-09-10 NOTE — Plan of Care (Signed)
?  Problem: Depression CCP Problem  1 Learn and Apply Coping Skills to Decrease Depressive Symptoms and improve medication compliance   ?Goal: LTG:: Improve medication compliance from 95% to 100% ?Outcome: Not Applicable ?Goal: STG: Ashten WILL PARTICIPATE IN AT LEAST 80% OF SCHEDULED INDIVIDUAL PSYCHOTHERAPY SESSIONS ?Outcome: Not Applicable ?Goal: STG: Lieutenant WILL COMPLETE AT LEAST 80% OF ASSIGNED HOMEWORK ?Outcome: Not Applicable ?Goal: STG: Jabril WILL IDENTIFY COGNITIVE PATTERNS AND BELIEFS THAT SUPPORT DEPRESSION ?Outcome: Not Applicable ?  ?

## 2021-09-10 NOTE — Progress Notes (Addendum)
? ?  THERAPIST PROGRESS NOTE ? ?Session Time: 55 minutes ? ?Participation Level: Active ? ?Behavioral Response: CasualAlertAnxious ? ?Type of Therapy: Individual Therapy ? ?Treatment Goals addressed: identify tx goals ? ?ProgressTowards Goals: Initial ? ?Interventions: Strength-based and Supportive ? ?Summary: [name] is a 41 y.o. male who presents for initial visit with this cln due to previous therapist resigning. He reports he is diagnosed with bipolar depression and ADHD and that when he forgets to take his medication, he struggles with concentration, mood swings, and severe depressive symptoms. He states that he struggles with taking his medication every day due to forgetting and then worrying if he already took it. He reports insomnia on and off and that he will have episodes that look like "micro-sleep" wherein his mind will wander and he is unable to focus on the task at hand. He states these occurrences can last 10-30 minutes, on average. He also reports he experiences significant anxiety when not on meds and describes it as "paralyzing anxiety." He provides the example of using the phone. He reports he will sometimes not call others because he is anxious about dialing the wrong number and having to apologize. He states he knows that is illogical, but will still feel anxious. He reports that he has improved his management of such thoughts through past therapy. He discusses background information to include family conflict and discusses some of his interests. He cites listening to music (especially metal/hard rock) as his primary coping skill. He also reports he plays video games with a group of friends. He states his goal for therapy is to improve medication compliance and is agreeable to working on managing anxiety and depression symptoms as he experiences them moving forward.  ? ?Suicidal/Homicidal: Nowithout intent/plan ? ?Therapist Response: Cln introduced self and asked pt to identify  pertinent background, stressors, and symptoms. Cln recommended a strategy for improving medication compliance wherein pt utilizes a dry-erase board or mirror to remind him to take his medications and to check off each day that he does, to which pt was receptive. Cln developed tx plan to assess progress based on pt's stated goals and reviewed with pt. Pt signed tx plan and cln instructed pt to schedule f/u with office staff for next available appt. ? ?Plan: Return again in 4 weeks. ? ?Diagnosis: Attention deficit hyperactivity disorder (ADHD), predominantly inattentive type ? ?Adjustment disorder with mixed anxiety and depressed mood ? ?Collaboration of Care: Other chart review of previous therapist's notes. ? ?Patient/Guardian was advised Release of Information must be obtained prior to any record release in order to collaborate their care with an outside provider. Patient/Guardian was advised if they have not already done so to contact the registration department to sign all necessary forms in order for [name] to release information regarding their care.  ? ?Consent: Patient/Guardian gives verbal consent for treatment and assignment of benefits for services provided during this visit. Patient/Guardian expressed understanding and agreed to proceed.  ? ?Wyvonnia Lora, LCSWA ?09/10/2021 ? ?

## 2021-09-19 ENCOUNTER — Other Ambulatory Visit: Payer: Self-pay

## 2021-09-22 ENCOUNTER — Ambulatory Visit (INDEPENDENT_AMBULATORY_CARE_PROVIDER_SITE_OTHER): Payer: Self-pay | Admitting: Orthopedic Surgery

## 2021-09-22 ENCOUNTER — Encounter: Payer: Self-pay | Admitting: Orthopedic Surgery

## 2021-09-22 DIAGNOSIS — M869 Osteomyelitis, unspecified: Secondary | ICD-10-CM

## 2021-09-22 NOTE — Progress Notes (Signed)
? ?Office Visit Note ?  ?Patient: [name]           ?Date of Birth: November 01, 1980           ?MRN: [id number] ?Visit Date: 09/22/2021 ?             ?Requested by: Marcine Matar, MD ?[address] ?[address] ?[place],  [place] [address] ?PCP: Marcine Matar, MD ? ?Chief Complaint  ?Patient presents with  ? Left Foot - Follow-up  ?  Ulcer GT and MRI review   ? ? ? ? ?HPI: ?Patient is a 41 year old gentleman is seen in follow-up status post MRI scan of the right foot.  Patient has persistent swelling and ulceration.  He is status post a left great toe amputation. ? ?Assessment & Plan: ?Visit Diagnoses:  ?1. Osteomyelitis of great toe of right foot (HCC)   ? ? ?Plan: With the osteomyelitis and chronic ulcer and failure of conservative therapy recommended proceeding with amputation of the great toe at the MTP joint.  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 ? ?Follow-Up Instructions: Return in about 2 months (around 11/22/2021).  ? ?Ortho Exam ? ?Patient is alert, oriented, no adenopathy, well-dressed, normal affect, normal respiratory effort. ?Examination patient has a strong palpable dorsalis pedis pulse he has sausage digit swelling of the right great toe with cellulitis without ulcer 2 x 3 cm beneath the IP joint.  There is no exposed bone or tendon.  Review of the MRI scan shows osteomyelitis of the right great toe.  Most recent hemoglobin A1c is 11.1. ? ?Imaging: ?No results found. ?No images are attached to the encounter. ? ?Labs: ?Lab Results  ?Component Value Date  ? HGBA1C 11.1 (H) 06/05/2021  ? HGBA1C 7.7 (A) 12/30/2020  ? HGBA1C 9.1 (A) 09/27/2020  ? ESRSEDRATE 48 (H) 03/21/2020  ? ESRSEDRATE 81 (H) 03/05/2020  ? CRP 5.6 03/21/2020  ? CRP 9.0 (H) 03/05/2020  ? LABURIC 4.6 06/26/2021  ? LABURIC 5.1 02/18/2021  ? REPTSTATUS 03/09/2020 FINAL 03/04/2020  ? CULT  03/04/2020  ?  NO GROWTH 5  DAYS ?Performed at [facility], [address], [place], [place] [address] ?  ? LABORGA GROUP B STREP(S.AGALACTIAE)ISOLATED 03/02/2020  ? ? ? ?Lab Results  ?Component Value Date  ? ALBUMIN 4.6 06/05/2021  ? ALBUMIN 4.6 10/28/2020  ? ALBUMIN 4.3 03/26/2020  ? ? ?No results found for: MG ?No results found for: VD25OH ? ?No results found for: PREALBUMIN ? ?  Latest Ref Rng & Units 08/09/2020  ?  6:26 AM 03/08/2020  ? 12:02 AM 03/07/2020  ? 12:22 AM  ?CBC EXTENDED  ?WBC 4.0 - 10.5 K/uL 14.8   6.8   7.4    ?RBC 4.22 - 5.81 MIL/uL 5.59   4.71   4.74    ?Hemoglobin 13.0 - 17.0 g/dL 24.4   62.8   63.8    ?HCT 39.0 - 52.0 % 46.7   39.7   39.6    ?Platelets 150 - 400 K/uL 275   349   322    ?NEUT# 1.7 - 7.7 K/uL  3.9   4.8    ?Lymph# 0.7 - 4.0 K/uL  1.9   1.8    ? ? ? ?There is no height or weight on file to calculate BMI. ? ?Orders:  ?No orders of the defined types were placed in this  encounter. ? ?No orders of the defined types were placed in this encounter. ? ? ? Procedures: ?No procedures performed ? ?Clinical Data: ?No additional findings. ? ?ROS: ? ?All other systems negative, except as noted in the HPI. ?Review of Systems ? ?Objective: ?Vital Signs: There were no vitals taken for this visit. ? ?Specialty Comments:  ?No specialty comments available. ? ?PMFS History: ?Patient Active Problem List  ? Diagnosis Date Noted  ? Bipolar depression (HCC) 09/10/2021  ? Adjustment disorder with mixed anxiety and depressed mood 09/10/2021  ? Attention deficit hyperactivity disorder (ADHD), predominantly inattentive type 07/30/2020  ? Anxiety with depression 06/07/2020  ? Hyperlipidemia due to type 2 diabetes mellitus (HCC) 03/27/2020  ? Status post amputation of left great toe (HCC) 03/26/2020  ? Influenza vaccination declined 03/26/2020  ? 23-polyvalent pneumococcal polysaccharide vaccine declined 03/26/2020  ? COVID-19 vaccine series completed 03/26/2020  ? Moderate major depression, single episode (HCC) 03/26/2020  ?  Anaerobic bacteremia 03/07/2020  ? Major depressive disorder, recurrent episode, moderate (HCC) 03/04/2020  ? Abscess of great toe, left   ? Sepsis due to group B Streptococcus with acute renal failure (HCC)   ? Cellulitis of left toe 03/03/2020  ? Diabetic ketoacidosis (HCC) 03/03/2020  ? DKA (diabetic ketoacidosis) (HCC) 03/03/2020  ? Ulcer of right foot with fat layer exposed (HCC) 06/06/2019  ? Non-pressure chronic ulcer of other part of left foot limited to breakdown of skin (HCC) 06/06/2019  ? Uncontrolled type 2 diabetes mellitus with hyperglycemia (HCC) 05/02/2018  ? 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) 07/30/2017  ? Venous insufficiency of both lower extremities 07/30/2017  ? Obesity, Class III, BMI 40-49.9 (morbid obesity) (HCC) 07/30/2017  ? Essential hypertension 09/03/2016  ? ?Past Medical History:  ?Diagnosis Date  ? ADHD   ? Anxiety   ? Bipolar disorder (HCC)   ? Depression   ? Diabetes mellitus without complication (HCC)   ? type 2  ? HLD (hyperlipidemia)   ? Hypertension   ? no meds  ?  ?Family History  ?Problem Relation Age of Onset  ? Diabetes Mother   ? Hyperlipidemia Father   ? Hypertension Father   ?  ?Past Surgical History:  ?Procedure Laterality Date  ? AMPUTATION Left 03/06/2020  ? Procedure: LEFT GREAT TOE AMPUTATION;  Surgeon: [name], MD;  [facility];  Service: Orthopedics;  Laterality: Left;  ? EYE SURGERY Bilateral   ? lasik  ? WISDOM TOOTH EXTRACTION    ? ?Social History  ? ?Occupational History  ? Occupation: Unemployed  ?Tobacco Use  ? Smoking status: Never  ? Smokeless tobacco: Never  ?Vaping Use  ? Vaping Use: Never used  ?Substance and Sexual Activity  ? Alcohol use: No  ?  Alcohol/week: 0.0 standard drinks  ? Drug use: No  ? Sexual activity: Not on file  ? ? ? ? ? ?

## 2021-09-25 ENCOUNTER — Other Ambulatory Visit: Payer: Self-pay

## 2021-09-26 ENCOUNTER — Other Ambulatory Visit: Payer: Self-pay

## 2021-10-06 ENCOUNTER — Telehealth (HOSPITAL_COMMUNITY): Payer: Self-pay | Admitting: Licensed Clinical Social Worker

## 2021-10-06 NOTE — Telephone Encounter (Signed)
See call intake 

## 2021-10-08 ENCOUNTER — Ambulatory Visit (INDEPENDENT_AMBULATORY_CARE_PROVIDER_SITE_OTHER): Payer: No Payment, Other | Admitting: Licensed Clinical Social Worker

## 2021-10-08 DIAGNOSIS — F900 Attention-deficit hyperactivity disorder, predominantly inattentive type: Secondary | ICD-10-CM | POA: Diagnosis not present

## 2021-10-08 NOTE — Progress Notes (Signed)
? ?  THERAPIST PROGRESS NOTE ? ?Session Time: 35 minutes ? ?Participation Level: Active ? ?Behavioral Response: CasualAlertEuthymic ? ?Type of Therapy: Individual Therapy ? ?Treatment Goals addressed: memory and attention ? ?ProgressTowards Goals: Progressing ? ?Interventions: Solution Focused and Supportive ? ?Summary: [name] is a 41 y.o. male who presents for f/u with this cln. He arrives late and maintains good eye contact throughout the session. He reports he slept poorly last night. "I get keyed up because I know I have an appointment the next day." He shares that he fell asleep between 5 and 6 am and had to get up around 9 am. He explains that he normally sleeps 4-6 hours every night. He reports his attention and memory are unchanged and that his mood has been "calm" even though he went without Zoloft for a few days due to forgetting to refill the prescription prior to running out. He denies needing to process anything in particular outside of strategies to help him remember thing. Lastly, he discusses past issues while working for [employer]. He is receptive to feedback from cln. ? ? ?Suicidal/Homicidal: Nowithout intent/plan ? ?Therapist Response: Cln assessed for current stressors, symptoms, and safety since last session. Cln utilized active listening and validation to assist with processing. Provided sleep hygiene handout with post-its with tips for reminders consisting of utilizing dry erase markers on a mirror or white board in an area that he frequents, a wall calendar near the bed or computer, and using his phone's calendar as opposed to alarms(which can easily be turned off unintentionally). Cln also discussed potential job options as well as the benefits of seeking a sleep study to assess for sleep apnea. Lastly, cln discussed possible jobs involving driving and confirmed preferred frequency of appointments (monthly).  ? ?Plan: Return again in 6 weeks (next available appt). ? ?Diagnosis:  Attention deficit hyperactivity disorder (ADHD), predominantly inattentive type ? ?Collaboration of Care: Other none required for this visit ? ?Patient/Guardian was advised Release of Information must be obtained prior to any record release in order to collaborate their care with an outside provider. Patient/Guardian was advised if they have not already done so to contact the registration department to sign all necessary forms in order for [name] to release information regarding their care.  ? ?Consent: Patient/Guardian gives verbal consent for treatment and assignment of benefits for services provided during this visit. Patient/Guardian expressed understanding and agreed to proceed.  ? ?Wyvonnia Lora, LCSWA ?10/08/2021 ? ?

## 2021-10-09 ENCOUNTER — Ambulatory Visit: Payer: Self-pay | Admitting: Internal Medicine

## 2021-10-14 ENCOUNTER — Encounter (HOSPITAL_COMMUNITY): Payer: Self-pay | Admitting: Orthopedic Surgery

## 2021-10-14 ENCOUNTER — Other Ambulatory Visit: Payer: Self-pay

## 2021-10-14 NOTE — Progress Notes (Addendum)
Mr. Myrtice Lauth denies chest pain or shortness of breath.  Patient denies having any s/s of Covid in his household.  Patient denies any known exposure to Covid.   Mr. Samson  reports that he, "came down with a stuffy head and cough yesterday and is concerned it this will affect his surgery."  Patient denies fever, states that nasal secretions are thick.  Mr. Finnick is a patient at [facility]. Insulin was adjusted when he saw the pharmacist at the clinic. Mr. Delbert reports that he may check CBG 1 time a week, states it has been high, it has been above 400 a few times.  I instructed Mr. Guedes to check his CBG several times to day to see if his blood sugars are lower than 300, I urged him to follow his diet and drink lots of water. I instructed Mr. Righi to talk 25 units of Basaglar this evening and to not take Metformin in am.  I instructed patient to check CBG after awaking and every 2 hours until arrival  to the hospital.  I Instructed patient if CBG is less than 70 to take 4 Glucose Tablets or 1 tube of Glucose Gel or 1/2 cup of a clear juice. Recheck CBG in 15 minutes if CBG is not over 70 call, pre- op desk at [phone] for further instructions.  I am asking anesthesiology  PA-C to review chart.

## 2021-10-15 ENCOUNTER — Other Ambulatory Visit: Payer: Self-pay

## 2021-10-15 ENCOUNTER — Encounter (HOSPITAL_COMMUNITY)
Admission: RE | Disposition: A | Discharge status: Home / Self Care | Payer: Self-pay | Source: Home / Self Care | Attending: Orthopedic Surgery

## 2021-10-15 ENCOUNTER — Ambulatory Visit (HOSPITAL_COMMUNITY): Payer: Self-pay | Admitting: Physician Assistant

## 2021-10-15 ENCOUNTER — Encounter (HOSPITAL_COMMUNITY): Payer: Self-pay | Admitting: Orthopedic Surgery

## 2021-10-15 ENCOUNTER — Ambulatory Visit (HOSPITAL_BASED_OUTPATIENT_CLINIC_OR_DEPARTMENT_OTHER): Payer: Self-pay | Admitting: Physician Assistant

## 2021-10-15 ENCOUNTER — Ambulatory Visit (HOSPITAL_COMMUNITY)
Admission: RE | Admit: 2021-10-15 | Discharge: 2021-10-15 | Disposition: A | Discharge status: Home / Self Care | Payer: Self-pay | Attending: Orthopedic Surgery | Admitting: Orthopedic Surgery

## 2021-10-15 DIAGNOSIS — Z794 Long term (current) use of insulin: Secondary | ICD-10-CM | POA: Insufficient documentation

## 2021-10-15 DIAGNOSIS — I10 Essential (primary) hypertension: Secondary | ICD-10-CM | POA: Insufficient documentation

## 2021-10-15 DIAGNOSIS — F319 Bipolar disorder, unspecified: Secondary | ICD-10-CM | POA: Insufficient documentation

## 2021-10-15 DIAGNOSIS — F419 Anxiety disorder, unspecified: Secondary | ICD-10-CM | POA: Insufficient documentation

## 2021-10-15 DIAGNOSIS — E1169 Type 2 diabetes mellitus with other specified complication: Secondary | ICD-10-CM

## 2021-10-15 DIAGNOSIS — M869 Osteomyelitis, unspecified: Secondary | ICD-10-CM

## 2021-10-15 DIAGNOSIS — Z833 Family history of diabetes mellitus: Secondary | ICD-10-CM | POA: Insufficient documentation

## 2021-10-15 DIAGNOSIS — Z7985 Long-term (current) use of injectable non-insulin antidiabetic drugs: Secondary | ICD-10-CM | POA: Insufficient documentation

## 2021-10-15 DIAGNOSIS — Z6841 Body Mass Index (BMI) 40.0 and over, adult: Secondary | ICD-10-CM | POA: Insufficient documentation

## 2021-10-15 DIAGNOSIS — L97519 Non-pressure chronic ulcer of other part of right foot with unspecified severity: Secondary | ICD-10-CM | POA: Insufficient documentation

## 2021-10-15 DIAGNOSIS — E11621 Type 2 diabetes mellitus with foot ulcer: Secondary | ICD-10-CM | POA: Insufficient documentation

## 2021-10-15 DIAGNOSIS — Z7984 Long term (current) use of oral hypoglycemic drugs: Secondary | ICD-10-CM | POA: Insufficient documentation

## 2021-10-15 DIAGNOSIS — E1151 Type 2 diabetes mellitus with diabetic peripheral angiopathy without gangrene: Secondary | ICD-10-CM | POA: Insufficient documentation

## 2021-10-15 DIAGNOSIS — Z8249 Family history of ischemic heart disease and other diseases of the circulatory system: Secondary | ICD-10-CM | POA: Insufficient documentation

## 2021-10-15 DIAGNOSIS — Z79899 Other long term (current) drug therapy: Secondary | ICD-10-CM | POA: Insufficient documentation

## 2021-10-15 DIAGNOSIS — L089 Local infection of the skin and subcutaneous tissue, unspecified: Secondary | ICD-10-CM

## 2021-10-15 HISTORY — DX: Peripheral vascular disease, unspecified: I73.9

## 2021-10-15 HISTORY — DX: Cardiac murmur, unspecified: R01.1

## 2021-10-15 HISTORY — PX: AMPUTATION: SHX166

## 2021-10-15 LAB — CBC
HCT: 46.9 % (ref 39.0–52.0)
Hemoglobin: 15 g/dL (ref 13.0–17.0)
MCH: 26.9 pg (ref 26.0–34.0)
MCHC: 32 g/dL (ref 30.0–36.0)
MCV: 84.2 fL (ref 80.0–100.0)
Platelets: 275 10*3/uL (ref 150–400)
RBC: 5.57 MIL/uL (ref 4.22–5.81)
RDW: 13.5 % (ref 11.5–15.5)
WBC: 10 10*3/uL (ref 4.0–10.5)
nRBC: 0 % (ref 0.0–0.2)

## 2021-10-15 LAB — BASIC METABOLIC PANEL
Anion gap: 10 (ref 5–15)
BUN: 12 mg/dL (ref 6–20)
CO2: 23 mmol/L (ref 22–32)
Calcium: 8.8 mg/dL — ABNORMAL LOW (ref 8.9–10.3)
Chloride: 102 mmol/L (ref 98–111)
Creatinine, Ser: 0.96 mg/dL (ref 0.61–1.24)
GFR, Estimated: >60 mL/min (ref >60)
Glucose, Bld: 363 mg/dL — ABNORMAL HIGH (ref 70–99)
Potassium: 4.2 mmol/L (ref 3.5–5.1)
Sodium: 135 mmol/L (ref 135–145)

## 2021-10-15 LAB — GLUCOSE, CAPILLARY
Glucose-Capillary: 379 mg/dL — ABNORMAL HIGH (ref 70–99)
Glucose-Capillary: 318 mg/dL — ABNORMAL HIGH (ref 70–99)
Glucose-Capillary: 273 mg/dL — ABNORMAL HIGH (ref 70–99)

## 2021-10-15 SURGERY — AMPUTATION DIGIT
Anesthesia: General | Site: Toe | Laterality: Right

## 2021-10-15 MED ORDER — LACTATED RINGERS IV SOLN: INTRAVENOUS | Status: DC

## 2021-10-15 MED ORDER — MIDAZOLAM HCL 2 MG/2ML IJ SOLN
INTRAMUSCULAR | Status: DC | PRN
  Administered 2021-10-15: 1 mg via INTRAVENOUS

## 2021-10-15 MED ORDER — ONDANSETRON HCL 4 MG/2ML IJ SOLN
INTRAMUSCULAR | Status: AC
  Filled 2021-10-15: qty 2

## 2021-10-15 MED ORDER — PROPOFOL 10 MG/ML IV BOLUS
INTRAVENOUS | Status: AC
  Filled 2021-10-15: qty 20

## 2021-10-15 MED ORDER — ORAL CARE MOUTH RINSE: 15.0000 mL | Freq: Once | OROMUCOSAL | Status: AC

## 2021-10-15 MED ORDER — MIDAZOLAM HCL 2 MG/2ML IJ SOLN
INTRAMUSCULAR | Status: AC
  Filled 2021-10-15: qty 2

## 2021-10-15 MED ORDER — BUPIVACAINE HCL (PF) 0.25 % IJ SOLN
INTRAMUSCULAR | Status: DC | PRN
  Administered 2021-10-15: 10 mL

## 2021-10-15 MED ORDER — INSULIN ASPART 100 UNIT/ML IJ SOLN
0.0000 [IU] | INTRAMUSCULAR | Status: DC | PRN
  Administered 2021-10-15: 12 [IU] via SUBCUTANEOUS
  Filled 2021-10-15: qty 1

## 2021-10-15 MED ORDER — CHLORHEXIDINE GLUCONATE 0.12 % MT SOLN
15.0000 mL | Freq: Once | OROMUCOSAL | Status: AC
  Administered 2021-10-15: 15 mL via OROMUCOSAL
  Filled 2021-10-15: qty 15

## 2021-10-15 MED ORDER — INSULIN ASPART 100 UNIT/ML IJ SOLN
5.0000 [IU] | Freq: Once | INTRAMUSCULAR | Status: AC
  Administered 2021-10-15: 5 [IU] via SUBCUTANEOUS

## 2021-10-15 MED ORDER — PHENYLEPHRINE 80 MCG/ML (10ML) SYRINGE FOR IV PUSH (FOR BLOOD PRESSURE SUPPORT)
PREFILLED_SYRINGE | INTRAVENOUS | Status: DC | PRN
  Administered 2021-10-15 (×3): 160 ug via INTRAVENOUS

## 2021-10-15 MED ORDER — OXYCODONE HCL 5 MG/5ML PO SOLN: 5.0000 mg | Freq: Once | ORAL | Status: DC | PRN

## 2021-10-15 MED ORDER — FENTANYL CITRATE (PF) 250 MCG/5ML IJ SOLN
INTRAMUSCULAR | Status: AC
Start: 2021-10-15 — End: ?
  Filled 2021-10-15: qty 5

## 2021-10-15 MED ORDER — FENTANYL CITRATE (PF) 250 MCG/5ML IJ SOLN
INTRAMUSCULAR | Status: DC | PRN
  Administered 2021-10-15 (×3): 25 ug via INTRAVENOUS

## 2021-10-15 MED ORDER — PROPOFOL 10 MG/ML IV BOLUS
INTRAVENOUS | Status: DC | PRN
Start: 2021-10-15 — End: 2021-10-15
  Administered 2021-10-15: 100 mg via INTRAVENOUS
  Administered 2021-10-15: 200 mg via INTRAVENOUS
  Administered 2021-10-15: 100 mg via INTRAVENOUS

## 2021-10-15 MED ORDER — HYDROMORPHONE HCL 1 MG/ML IJ SOLN: 0.2500 mg | INTRAMUSCULAR | Status: DC | PRN

## 2021-10-15 MED ORDER — CEFAZOLIN IN SODIUM CHLORIDE 3-0.9 GM/100ML-% IV SOLN
3.0000 g | INTRAVENOUS | Status: AC
  Administered 2021-10-15: 3 g via INTRAVENOUS
  Filled 2021-10-15: qty 100

## 2021-10-15 MED ORDER — PROMETHAZINE HCL 25 MG/ML IJ SOLN: 6.2500 mg | INTRAMUSCULAR | Status: DC | PRN

## 2021-10-15 MED ORDER — 0.9 % SODIUM CHLORIDE (POUR BTL) OPTIME
TOPICAL | Status: DC | PRN
  Administered 2021-10-15: 1000 mL

## 2021-10-15 MED ORDER — PHENYLEPHRINE 80 MCG/ML (10ML) SYRINGE FOR IV PUSH (FOR BLOOD PRESSURE SUPPORT)
PREFILLED_SYRINGE | INTRAVENOUS | Status: AC
  Filled 2021-10-15: qty 10

## 2021-10-15 MED ORDER — AMISULPRIDE (ANTIEMETIC) 5 MG/2ML IV SOLN: 10.0000 mg | Freq: Once | INTRAVENOUS | Status: DC | PRN

## 2021-10-15 MED ORDER — OXYCODONE-ACETAMINOPHEN 5-325 MG PO TABS: 1.0000 | ORAL_TABLET | ORAL | 0 refills | Status: DC | PRN

## 2021-10-15 MED ORDER — LIDOCAINE 2% (20 MG/ML) 5 ML SYRINGE
INTRAMUSCULAR | Status: AC
  Filled 2021-10-15: qty 5

## 2021-10-15 MED ORDER — BUPIVACAINE HCL (PF) 0.25 % IJ SOLN
INTRAMUSCULAR | Status: AC
  Filled 2021-10-15: qty 30

## 2021-10-15 MED ORDER — OXYCODONE HCL 5 MG PO TABS: 5.0000 mg | ORAL_TABLET | Freq: Once | ORAL | Status: DC | PRN

## 2021-10-15 MED ORDER — LIDOCAINE 2% (20 MG/ML) 5 ML SYRINGE
INTRAMUSCULAR | Status: DC | PRN
  Administered 2021-10-15: 100 mg via INTRAVENOUS

## 2021-10-15 MED ORDER — ONDANSETRON HCL 4 MG/2ML IJ SOLN
INTRAMUSCULAR | Status: DC | PRN
  Administered 2021-10-15: 4 mg via INTRAVENOUS

## 2021-10-15 SURGICAL SUPPLY — 33 items
BAG COUNTER SPONGE SURGICOUNT (BAG) ×2 IMPLANT
BAG SPNG CNTER NS LX DISP (BAG) ×1
BLADE SURG 21 STRL SS (BLADE) ×2 IMPLANT
BNDG CMPR 9X4 STRL LF SNTH (GAUZE/BANDAGES/DRESSINGS)
BNDG COHESIVE 4X5 TAN ST LF (GAUZE/BANDAGES/DRESSINGS) ×1 IMPLANT
BNDG COHESIVE 4X5 TAN STRL (GAUZE/BANDAGES/DRESSINGS) ×2 IMPLANT
BNDG ESMARK 4X9 LF (GAUZE/BANDAGES/DRESSINGS) IMPLANT
BNDG GAUZE ELAST 4 BULKY (GAUZE/BANDAGES/DRESSINGS) ×2 IMPLANT
COVER SURGICAL LIGHT HANDLE (MISCELLANEOUS) ×4 IMPLANT
DRAPE U-SHAPE 47X51 STRL (DRAPES) ×2 IMPLANT
DRSG ADAPTIC 3X8 NADH LF (GAUZE/BANDAGES/DRESSINGS) IMPLANT
DRSG EMULSION OIL 3X3 NADH (GAUZE/BANDAGES/DRESSINGS) ×1 IMPLANT
DRSG PAD ABDOMINAL 8X10 ST (GAUZE/BANDAGES/DRESSINGS) ×2 IMPLANT
DURAPREP 26ML APPLICATOR (WOUND CARE) ×2 IMPLANT
ELECT REM PT RETURN 9FT ADLT (ELECTROSURGICAL) ×2
ELECTRODE REM PT RTRN 9FT ADLT (ELECTROSURGICAL) ×1 IMPLANT
GAUZE PAD ABD 8X10 STRL (GAUZE/BANDAGES/DRESSINGS) ×1 IMPLANT
GAUZE SPONGE 4X4 12PLY STRL (GAUZE/BANDAGES/DRESSINGS) ×1 IMPLANT
GLOVE BIOGEL PI IND STRL 9 (GLOVE) ×1 IMPLANT
GLOVE BIOGEL PI INDICATOR 9 (GLOVE) ×1
GLOVE SURG ORTHO 9.0 STRL STRW (GLOVE) ×2 IMPLANT
GOWN STRL REUS W/ TWL XL LVL3 (GOWN DISPOSABLE) ×2 IMPLANT
GOWN STRL REUS W/TWL XL LVL3 (GOWN DISPOSABLE) ×4
KIT BASIN OR (CUSTOM PROCEDURE TRAY) ×2 IMPLANT
KIT TURNOVER KIT B (KITS) ×2 IMPLANT
MANIFOLD NEPTUNE II (INSTRUMENTS) ×2 IMPLANT
NEEDLE 22X1 1/2 (OR ONLY) (NEEDLE) IMPLANT
NS IRRIG 1000ML POUR BTL (IV SOLUTION) ×2 IMPLANT
PACK ORTHO EXTREMITY (CUSTOM PROCEDURE TRAY) ×2 IMPLANT
PAD ARMBOARD 7.5X6 YLW CONV (MISCELLANEOUS) ×4 IMPLANT
SUT ETHILON 2 0 PSLX (SUTURE) ×2 IMPLANT
SYR CONTROL 10ML LL (SYRINGE) IMPLANT
TOWEL GREEN STERILE (TOWEL DISPOSABLE) ×2 IMPLANT

## 2021-10-15 NOTE — Transfer of Care (Signed)
Immediate Anesthesia Transfer of Care Note  Patient: [name]  Procedure(s) Performed: RIGHT GREAT TOE AMPUTATION (Right: Toe)  Patient Location: PACU  Anesthesia Type:General  Level of Consciousness: awake, drowsy, patient cooperative and responds to stimulation  Airway & Oxygen Therapy: Patient Spontanous Breathing and Patient connected to face mask oxygen  Post-op Assessment: Report given to RN and Post -op Vital signs reviewed and stable  Post vital signs: Reviewed and stable  Last Vitals:  Vitals Value Taken Time  BP 93/56 10/15/21 0924  Temp 37.5 C 10/15/21 0925  Pulse 95 10/15/21 0925  Resp 26 10/15/21 0925  SpO2 96 % 10/15/21 0925  Vitals shown include unvalidated device data.  Last Pain:  Vitals:   10/15/21 0727  TempSrc:   PainSc: 0-No pain         Complications: No notable events documented.

## 2021-10-15 NOTE — Anesthesia Postprocedure Evaluation (Signed)
Anesthesia Post Note  Patient: [name]  Procedure(s) Performed: RIGHT GREAT TOE AMPUTATION (Right: Toe)     Patient location during evaluation: PACU Anesthesia Type: General Level of consciousness: awake and alert Pain management: pain level controlled Vital Signs Assessment: post-procedure vital signs reviewed and stable Respiratory status: spontaneous breathing, nonlabored ventilation and respiratory function stable Cardiovascular status: blood pressure returned to baseline and stable Postop Assessment: no apparent nausea or vomiting Anesthetic complications: no   No notable events documented.  Last Vitals:  Vitals:   10/15/21 0940 10/15/21 0955  BP: 110/70 117/67  Pulse: 98 95  Resp: 20 12  Temp:  37.2 C  SpO2: 95% 94%    Last Pain:  Vitals:   10/15/21 0955  TempSrc:   PainSc: 0-No pain                 Lowella Curb

## 2021-10-15 NOTE — Anesthesia Procedure Notes (Signed)
Procedure Name: LMA Insertion Date/Time: 10/15/2021 8:52 AM Performed by: Ayesha Rumpf, CRNA Pre-anesthesia Checklist: Patient identified, Emergency Drugs available, Suction available and Patient being monitored Patient Re-evaluated:Patient Re-evaluated prior to induction Oxygen Delivery Method: Circle System Utilized Preoxygenation: Pre-oxygenation with 100% oxygen Induction Type: IV induction Ventilation: Mask ventilation without difficulty LMA: LMA inserted LMA Size: 5.0 Number of attempts: 1 Airway Equipment and Method: Bite block Placement Confirmation: positive ETCO2 Tube secured with: Tape Dental Injury: Teeth and Oropharynx as per pre-operative assessment

## 2021-10-15 NOTE — Progress Notes (Signed)
Orthopedic Tech Progress Note Patient Details:  [name] July 13, 1980 [id number]  Ortho Devices Type of Ortho Device: Postop shoe/boot Ortho Device/Splint Location: RLE Ortho Device/Splint Interventions: Ordered, Application, Adjustment   Post Interventions Patient Tolerated: Well Instructions Provided: Adjustment of device, Care of device  [place] A Honest Safranek 10/15/2021, 10:01 AM

## 2021-10-15 NOTE — H&P (Signed)
[name] is an 41 y.o. male.   Chief Complaint: Ulceration and swelling right great toe. HPI: Patient is a 41 year old gentleman who has had chronic ulceration to the right great toe is status post an MRI scan that shows osteomyelitis.  Past Medical History:  Diagnosis Date   ADHD    Anxiety    Bipolar disorder ([place])    Depression    Diabetes mellitus without complication ([place])    type 2   Heart murmur    as a child   HLD (hyperlipidemia)    Hypertension    no meds   Peripheral vascular disease ([place])     Past Surgical History:  Procedure Laterality Date   AMPUTATION Left 03/06/2020   Procedure: LEFT GREAT TOE AMPUTATION;  Surgeon: Newt Minion, MD;  Location: [facility];  Service: Orthopedics;  Laterality: Left;   EYE SURGERY Bilateral    lasik   WISDOM TOOTH EXTRACTION      Family History  Problem Relation Age of Onset   Diabetes Mother    Hyperlipidemia Father    Hypertension Father    Social History:  reports that he has never smoked. He has never used smokeless tobacco. He reports that he does not currently use alcohol. He reports that he does not use drugs.  Allergies:  Allergies  Allergen Reactions   Milk-Related Compounds Diarrhea    Medications Prior to Admission  Medication Sig Dispense Refill   atomoxetine (STRATTERA) 80 MG capsule Take 1 capsule (80 mg total) by mouth daily. (Patient taking differently: Take 80 mg by mouth daily. Off of right now.- 10/15/22) 30 capsule 3   Dulaglutide (TRULICITY) 1.5 HE/1.7EY SOPN Inject 1.5 mg into the skin once a week. (Patient taking differently: Inject 1.5 mg into the skin every Friday.) 2 mL 2   Insulin Glargine (BASAGLAR KWIKPEN) 100 UNIT/ML Inject 40 Units into the skin daily. Increase by 2 units every 3 days until CBGs <130. Max: 50 units. (Patient taking differently: Inject 50 Units into the skin every evening.  Max: 50 units.) 15 mL 1   metFORMIN (GLUCOPHAGE) 500 MG tablet Take 2 tablets (1,000 mg total) by  mouth 2 (two) times daily with a meal. 120 tablet 2   pravastatin (PRAVACHOL) 40 MG tablet Take 1 tablet (40 mg total) by mouth daily. (Patient taking differently: Take 40 mg by mouth daily. 1900) 30 tablet 6   sertraline (ZOLOFT) 50 MG tablet Take 1 tablet (50 mg total) by mouth daily. (Patient taking differently: Take 50 mg by mouth daily. 1900) 30 tablet 3   blood glucose meter kit and supplies Dispense based on patient and insurance preference. Use up to four times daily as directed. (FOR ICD-10 E10.9, E11.9). 1 each 0   Insulin Pen Needle (PEN NEEDLES) 31G X 8 MM MISC UAD 100 each 6    No results found for this or any previous visit (from the past 48 hour(s)). No results found.  Review of Systems  All other systems reviewed and are negative.  Height '6\' 1"'  (1.854 m), weight (!) 158.8 kg. Physical Exam  Patient is alert, oriented, no adenopathy, well-dressed, normal affect, normal respiratory effort. Examination patient has a strong palpable dorsalis pedis pulse he has sausage digit swelling of the right great toe with cellulitis without ulcer 2 x 3 cm beneath the IP joint.  There is no exposed bone or tendon.  Review of the MRI scan shows osteomyelitis of the right great toe.  Most recent hemoglobin  A1c is 11.1. Assessment/Plan Assessment: Osteomyelitis ulceration right great toe.  Plan: Due to failure of conservative therapy I have recommended proceeding with a amputation of the great toe at the MTP joint.  Risk and benefits were discussed including risk of the wound not healing need for additional surgery.  Patient states he understands wished to proceed at this time.  Newt Minion, MD 10/15/2021, 6:47 AM

## 2021-10-15 NOTE — Op Note (Signed)
10/15/2021  9:15 AM  PATIENT:  [name]    PRE-OPERATIVE DIAGNOSIS:  Osteomyelitis Right Great Toe  POST-OPERATIVE DIAGNOSIS:  Same  PROCEDURE:  RIGHT GREAT TOE AMPUTATION  SURGEON:  [name], MD  PHYSICIAN ASSISTANT:None ANESTHESIA:   General  PREOPERATIVE INDICATIONS:  [name] is a  41 y.o. male with a diagnosis of Osteomyelitis Right Great Toe who failed conservative measures and elected for surgical management.    The risks benefits and alternatives were discussed with the patient preoperatively including but not limited to the risks of infection, bleeding, nerve injury, cardiopulmonary complications, the need for revision surgery, among others, and the patient was willing to proceed.  OPERATIVE IMPLANTS: none  @ENCIMAGES @  OPERATIVE FINDINGS: Tissue margins clear  OPERATIVE PROCEDURE: Patient was brought the operating room and underwent an LMA anesthetic.  After adequate levels anesthesia were obtained patient's right lower extremity was prepped using DuraPrep draped into a sterile field a timeout was called.  Patient received a digital block with 10 cc of 1% lidocaine plain.  A fishmouth incision was made proximal to the ulcerative tissue just distal to the MTP joint.  The toe was amputated through the MTP joint.  Electrocautery was used hemostasis.  The wound margins were clear the wound was irrigated with normal saline incision closed using 2-0 nylon a sterile dressing was applied patient was extubated taken the PACU in stable condition.   DISCHARGE PLANNING:  Antibiotic duration: Preoperative  Weightbearing: Touchdown weightbearing on the right  Pain medication: Prescription for Percocet  Dressing care/ Wound VAC: Dry dressing follow-up in the office 1 week to change  Ambulatory devices: Crutches  Discharge to: Home  Follow-up: In the office 1 week post operative.

## 2021-10-15 NOTE — Anesthesia Preprocedure Evaluation (Signed)
Anesthesia Evaluation  Patient identified by MRN, date of birth, ID band Patient awake    Reviewed: Allergy & Precautions, NPO status , Patient's Chart, lab work & pertinent test results  Airway Mallampati: I  TM Distance: >3 FB Neck ROM: Full    Dental no notable dental hx.    Pulmonary    Pulmonary exam normal breath sounds clear to auscultation       Cardiovascular hypertension, Pt. on medications + Peripheral Vascular Disease  Normal cardiovascular exam Rhythm:Regular Rate:Normal     Neuro/Psych Anxiety Depression Bipolar Disorder    GI/Hepatic   Endo/Other  diabetes, Type 2, Oral Hypoglycemic AgentsMorbid obesity  Renal/GU      Musculoskeletal   Abdominal (+) + obese,   Peds  Hematology   Anesthesia Other Findings   Reproductive/Obstetrics                             Anesthesia Physical  Anesthesia Plan  ASA: III  Anesthesia Plan: General   Post-op Pain Management: Dilaudid IV, Regional block* and Minimal or no pain anticipated   Induction: Intravenous  PONV Risk Score and Plan: 2 and Ondansetron, Midazolam and Treatment may vary due to age or medical condition  Airway Management Planned: LMA  Additional Equipment:   Intra-op Plan:   Post-operative Plan: Extubation in OR  Informed Consent: I have reviewed the patients History and Physical, chart, labs and discussed the procedure including the risks, benefits and alternatives for the proposed anesthesia with the patient or authorized representative who has indicated his/her understanding and acceptance.       Plan Discussed with: CRNA and Surgeon  Anesthesia Plan Comments:         Anesthesia Quick Evaluation

## 2021-10-16 ENCOUNTER — Encounter (HOSPITAL_COMMUNITY): Payer: Self-pay | Admitting: Orthopedic Surgery

## 2021-10-16 ENCOUNTER — Ambulatory Visit (HOSPITAL_COMMUNITY): Payer: No Payment, Other | Admitting: Licensed Clinical Social Worker

## 2021-10-23 ENCOUNTER — Other Ambulatory Visit: Payer: Self-pay

## 2021-10-23 ENCOUNTER — Encounter: Payer: Self-pay | Admitting: Orthopedic Surgery

## 2021-10-23 ENCOUNTER — Ambulatory Visit (INDEPENDENT_AMBULATORY_CARE_PROVIDER_SITE_OTHER): Payer: Self-pay | Admitting: Orthopedic Surgery

## 2021-10-23 ENCOUNTER — Other Ambulatory Visit: Payer: Self-pay | Admitting: Internal Medicine

## 2021-10-23 ENCOUNTER — Ambulatory Visit: Payer: Self-pay

## 2021-10-23 DIAGNOSIS — E1169 Type 2 diabetes mellitus with other specified complication: Secondary | ICD-10-CM

## 2021-10-23 DIAGNOSIS — S98111A Complete traumatic amputation of right great toe, initial encounter: Secondary | ICD-10-CM

## 2021-10-23 DIAGNOSIS — Z89411 Acquired absence of right great toe: Secondary | ICD-10-CM

## 2021-10-23 MED ORDER — DOXYCYCLINE HYCLATE 100 MG PO TABS
100.0000 mg | ORAL_TABLET | Freq: Two times a day (BID) | ORAL | 0 refills | Status: DC
  Filled 2021-10-23: qty 60, 30d supply, fill #0

## 2021-10-23 MED ORDER — PRAVASTATIN SODIUM 20 MG PO TABS
ORAL_TABLET | ORAL | 1 refills | Status: DC
  Filled 2021-10-23: qty 90, 90d supply, fill #0

## 2021-10-23 MED ORDER — PRAVASTATIN SODIUM 40 MG PO TABS
40.0000 mg | ORAL_TABLET | Freq: Every day | ORAL | 2 refills | Status: DC
  Filled 2021-10-23: qty 30, 30d supply, fill #0
  Filled 2021-11-26 (×2): qty 30, 30d supply, fill #1
  Filled 2022-02-06: qty 30, 30d supply, fill #2

## 2021-10-23 NOTE — Progress Notes (Signed)
Office Visit Note   Patient: [name]           Date of Birth: March 02, 1981           MRN: [id number] Visit Date: 10/23/2021              Requested by: Marcine Matar, MD [address] [place],  [place] [address] PCP: Marcine Matar, MD  Chief Complaint  Patient presents with   Right Foot - Routine Post Op    10/15/21 right great toe amputation      HPI: Patient is a 41 year old gentleman 1 week status post right great toe amputation at the MTP joint.  Patient denies any systemic symptoms.  Assessment & Plan: Visit Diagnoses:  1. Amputated great toe, right (HCC)     Plan: We will start doxycycline Dial soap cleansing continue protected weightbearing follow-up on Monday.  Discussed the concern for the cellulitis dorsally.  Follow-Up Instructions: Return in about 1 week (around 10/30/2021).   Ortho Exam  Patient is alert, oriented, no adenopathy, well-dressed, normal affect, normal respiratory effort. Examination patient has strong dorsalis pedis pulse there is about a centimeter of cellulitis dorsally over the incision the incision is well approximated no necrotic changes.  There is no purulent drainage.  Imaging: No results found. No images are attached to the encounter.  Labs: Lab Results  Component Value Date   HGBA1C 11.1 (H) 06/05/2021   HGBA1C 7.7 (A) 12/30/2020   HGBA1C 9.1 (A) 09/27/2020   ESRSEDRATE 48 (H) 03/21/2020   ESRSEDRATE 81 (H) 03/05/2020   CRP 5.6 03/21/2020   CRP 9.0 (H) 03/05/2020   LABURIC 4.6 06/26/2021   LABURIC 5.1 02/18/2021   REPTSTATUS 03/09/2020 FINAL 03/04/2020   CULT  03/04/2020    NO GROWTH 5 DAYS Performed at [facility], [address], [place], [place] [address]    LABORGA GROUP B STREP(S.AGALACTIAE)ISOLATED 03/02/2020     Lab Results  Component Value Date   ALBUMIN 4.6 06/05/2021   ALBUMIN 4.6 10/28/2020   ALBUMIN 4.3 03/26/2020    No results found for: MG No results found for:  VD25OH  No results found for: PREALBUMIN    Latest Ref Rng & Units 10/15/2021    7:26 AM 08/09/2020    6:26 AM 03/08/2020   12:02 AM  CBC EXTENDED  WBC 4.0 - 10.5 K/uL 10.0   14.8   6.8    RBC 4.22 - 5.81 MIL/uL 5.57   5.59   4.71    Hemoglobin 13.0 - 17.0 g/dL 10.6   26.9   48.5    HCT 39.0 - 52.0 % 46.9   46.7   39.7    Platelets 150 - 400 K/uL 275   275   349    NEUT# 1.7 - 7.7 K/uL   3.9    Lymph# 0.7 - 4.0 K/uL   1.9       There is no height or weight on file to calculate BMI.  Orders:  No orders of the defined types were placed in this encounter.  No orders of the defined types were placed in this encounter.    Procedures: No procedures performed  Clinical Data: No additional findings.  ROS:  All other systems negative, except as noted in the HPI. Review of Systems  Objective: Vital Signs: There were no vitals taken for this visit.  Specialty Comments:  No specialty comments available.  PMFS History: Patient Active Problem List  Diagnosis Date Noted   Osteomyelitis of great toe of right foot (HCC)    Bipolar depression (HCC) 09/10/2021   Adjustment disorder with mixed anxiety and depressed mood 09/10/2021   Attention deficit hyperactivity disorder (ADHD), predominantly inattentive type 07/30/2020   Anxiety with depression 06/07/2020   Hyperlipidemia due to type 2 diabetes mellitus (HCC) 03/27/2020   Status post amputation of left great toe (HCC) 03/26/2020   Influenza vaccination declined 03/26/2020   23-polyvalent pneumococcal polysaccharide vaccine declined 03/26/2020   COVID-19 vaccine series completed 03/26/2020   Moderate major depression, single episode (HCC) 03/26/2020   Anaerobic bacteremia 03/07/2020   Major depressive disorder, recurrent episode, moderate (HCC) 03/04/2020   Abscess of great toe, left    Sepsis due to group B Streptococcus with acute renal failure (HCC)    Cellulitis of left toe 03/03/2020   Diabetic ketoacidosis (HCC)  03/03/2020   DKA (diabetic ketoacidosis) (HCC) 03/03/2020   Ulcer of right foot with fat layer exposed (HCC) 06/06/2019   Non-pressure chronic ulcer of other part of left foot limited to breakdown of skin (HCC) 06/06/2019   Uncontrolled type 2 diabetes mellitus with hyperglycemia (HCC) 05/02/2018   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) 07/30/2017   Venous insufficiency of both lower extremities 07/30/2017   Obesity, Class III, BMI 40-49.9 (morbid obesity) (HCC) 07/30/2017   Essential hypertension 09/03/2016   Past Medical History:  Diagnosis Date   ADHD    Anxiety    Bipolar disorder (HCC)    Depression    Diabetes mellitus without complication (HCC)    type 2   Heart murmur    as a child   HLD (hyperlipidemia)    Hypertension    no meds   Peripheral vascular disease (HCC)     Family History  Problem Relation Age of Onset   Diabetes Mother    Hyperlipidemia Father    Hypertension Father     Past Surgical History:  Procedure Laterality Date   AMPUTATION Left 03/06/2020   Procedure: LEFT GREAT TOE AMPUTATION;  Surgeon: [name], MD;  Location: [facility] OR;  Service: Orthopedics;  Laterality: Left;   AMPUTATION Right 10/15/2021   Procedure: RIGHT GREAT TOE AMPUTATION;  Surgeon: [name], MD;  Location: [facility] OR;  Service: Orthopedics;  Laterality: Right;   EYE SURGERY Bilateral    lasik   WISDOM TOOTH EXTRACTION     Social History   Occupational History   Occupation: Unemployed  Tobacco Use   Smoking status: Never   Smokeless tobacco: Never  Vaping Use   Vaping Use: Never used  Substance and Sexual Activity   Alcohol use: Not Currently    Comment: last drink 2018   Drug use: Never   Sexual activity: Not on file

## 2021-10-24 ENCOUNTER — Other Ambulatory Visit: Payer: Self-pay

## 2021-10-27 ENCOUNTER — Ambulatory Visit (INDEPENDENT_AMBULATORY_CARE_PROVIDER_SITE_OTHER): Payer: Self-pay | Admitting: Orthopedic Surgery

## 2021-10-27 ENCOUNTER — Encounter: Payer: Self-pay | Admitting: Orthopedic Surgery

## 2021-10-27 DIAGNOSIS — S98111A Complete traumatic amputation of right great toe, initial encounter: Secondary | ICD-10-CM

## 2021-10-27 DIAGNOSIS — Z89411 Acquired absence of right great toe: Secondary | ICD-10-CM

## 2021-10-27 NOTE — Progress Notes (Signed)
Office Visit Note   Patient: [name]           Date of Birth: Dec 11, 1980           MRN: [id number] Visit Date: 10/27/2021              Requested by: Marcine Matar, MD [address] [place] [address] [place],  [place] [address] PCP: Marcine Matar, MD  Chief Complaint  Patient presents with   Right Foot - Routine Post Op    10/15/2021 right GT amputation       HPI: Patient is a 41 year old gentleman who is seen in follow-up for right great toe amputation.  Patient states in the morning his foot has no redness or swelling.  He is currently on doxycycline.  He is in a postoperative shoe and using crutches.  Assessment & Plan: Visit Diagnoses:  1. Amputated great toe, right (HCC)     Plan: Continue with Dial soap cleansing dry dressing change and antibiotics.  Anticipate removal of sutures in 1 week.  Follow-Up Instructions: Return in about 1 week (around 11/03/2021).   Ortho Exam  Patient is alert, oriented, no adenopathy, well-dressed, normal affect, normal respiratory effort. Examination patient does have some dependent redness he is 2 weeks out from surgery there is no tenderness to palpation no odor no drainage she has a strong palpable dorsalis pedis pulse there is good petechial bleeding along the wound edges.  Imaging: No results found. No images are attached to the encounter.  Labs: Lab Results  Component Value Date   HGBA1C 11.1 (H) 06/05/2021   HGBA1C 7.7 (A) 12/30/2020   HGBA1C 9.1 (A) 09/27/2020   ESRSEDRATE 48 (H) 03/21/2020   ESRSEDRATE 81 (H) 03/05/2020   CRP 5.6 03/21/2020   CRP 9.0 (H) 03/05/2020   LABURIC 4.6 06/26/2021   LABURIC 5.1 02/18/2021   REPTSTATUS 03/09/2020 FINAL 03/04/2020   CULT  03/04/2020    NO GROWTH 5 DAYS Performed at [name], [address], [place], [place] [address]    LABORGA GROUP B STREP(S.AGALACTIAE)ISOLATED 03/02/2020     Lab Results  Component Value Date   ALBUMIN 4.6 06/05/2021   ALBUMIN  4.6 10/28/2020   ALBUMIN 4.3 03/26/2020    No results found for: MG No results found for: VD25OH  No results found for: PREALBUMIN    Latest Ref Rng & Units 10/15/2021    7:26 AM 08/09/2020    6:26 AM 03/08/2020   12:02 AM  CBC EXTENDED  WBC 4.0 - 10.5 K/uL 10.0   14.8   6.8    RBC 4.22 - 5.81 MIL/uL 5.57   5.59   4.71    Hemoglobin 13.0 - 17.0 g/dL 12.4   58.0   99.8    HCT 39.0 - 52.0 % 46.9   46.7   39.7    Platelets 150 - 400 K/uL 275   275   349    NEUT# 1.7 - 7.7 K/uL   3.9    Lymph# 0.7 - 4.0 K/uL   1.9       There is no height or weight on file to calculate BMI.  Orders:  No orders of the defined types were placed in this encounter.  No orders of the defined types were placed in this encounter.    Procedures: No procedures performed  Clinical Data: No additional findings.  ROS:  All other systems negative, except as noted in the HPI. Review of Systems  Objective: Vital Signs: There were no vitals taken for this visit.  Specialty Comments:  No specialty comments available.  PMFS History: Patient Active Problem List   Diagnosis Date Noted   Osteomyelitis of great toe of right foot (HCC)    Bipolar depression (HCC) 09/10/2021   Adjustment disorder with mixed anxiety and depressed mood 09/10/2021   Attention deficit hyperactivity disorder (ADHD), predominantly inattentive type 07/30/2020   Anxiety with depression 06/07/2020   Hyperlipidemia due to type 2 diabetes mellitus (HCC) 03/27/2020   Status post amputation of left great toe (HCC) 03/26/2020   Influenza vaccination declined 03/26/2020   23-polyvalent pneumococcal polysaccharide vaccine declined 03/26/2020   COVID-19 vaccine series completed 03/26/2020   Moderate major depression, single episode (HCC) 03/26/2020   Anaerobic bacteremia 03/07/2020   Major depressive disorder, recurrent episode, moderate (HCC) 03/04/2020   Abscess of great toe, left    Sepsis due to group B Streptococcus with  acute renal failure (HCC)    Cellulitis of left toe 03/03/2020   Diabetic ketoacidosis (HCC) 03/03/2020   DKA (diabetic ketoacidosis) (HCC) 03/03/2020   Ulcer of right foot with fat layer exposed (HCC) 06/06/2019   Non-pressure chronic ulcer of other part of left foot limited to breakdown of skin (HCC) 06/06/2019   Uncontrolled type 2 diabetes mellitus with hyperglycemia (HCC) 05/02/2018   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) 07/30/2017   Venous insufficiency of both lower extremities 07/30/2017   Obesity, Class III, BMI 40-49.9 (morbid obesity) (HCC) 07/30/2017   Essential hypertension 09/03/2016   Past Medical History:  Diagnosis Date   ADHD    Anxiety    Bipolar disorder (HCC)    Depression    Diabetes mellitus without complication (HCC)    type 2   Heart murmur    as a child   HLD (hyperlipidemia)    Hypertension    no meds   Peripheral vascular disease (HCC)     Family History  Problem Relation Age of Onset   Diabetes Mother    Hyperlipidemia Father    Hypertension Father     Past Surgical History:  Procedure Laterality Date   AMPUTATION Left 03/06/2020   Procedure: LEFT GREAT TOE AMPUTATION;  Surgeon: [name], MD;  Location: [facility] OR;  Service: Orthopedics;  Laterality: Left;   AMPUTATION Right 10/15/2021   Procedure: RIGHT GREAT TOE AMPUTATION;  Surgeon: [name], MD;  Location: [name] OR;  Service: Orthopedics;  Laterality: Right;   EYE SURGERY Bilateral    lasik   WISDOM TOOTH EXTRACTION     Social History   Occupational History   Occupation: Unemployed  Tobacco Use   Smoking status: Never   Smokeless tobacco: Never  Vaping Use   Vaping Use: Never used  Substance and Sexual Activity   Alcohol use: Not Currently    Comment: last drink 2018   Drug use: Never   Sexual activity: Not on file

## 2021-10-28 ENCOUNTER — Other Ambulatory Visit (HOSPITAL_COMMUNITY): Payer: Self-pay

## 2021-10-28 ENCOUNTER — Telehealth (INDEPENDENT_AMBULATORY_CARE_PROVIDER_SITE_OTHER): Payer: No Payment, Other | Admitting: Psychiatry

## 2021-10-28 ENCOUNTER — Other Ambulatory Visit: Payer: Self-pay

## 2021-10-28 ENCOUNTER — Encounter (HOSPITAL_COMMUNITY): Payer: Self-pay | Admitting: Psychiatry

## 2021-10-28 DIAGNOSIS — F900 Attention-deficit hyperactivity disorder, predominantly inattentive type: Secondary | ICD-10-CM

## 2021-10-28 DIAGNOSIS — F321 Major depressive disorder, single episode, moderate: Secondary | ICD-10-CM | POA: Diagnosis not present

## 2021-10-28 MED ORDER — ATOMOXETINE HCL 80 MG PO CAPS
80.0000 mg | ORAL_CAPSULE | Freq: Every day | ORAL | 3 refills | Status: DC
  Filled 2021-10-28: qty 30, 30d supply, fill #0
  Filled 2021-11-26 (×2): qty 30, 30d supply, fill #1

## 2021-10-28 MED ORDER — SERTRALINE HCL 50 MG PO TABS
50.0000 mg | ORAL_TABLET | Freq: Every day | ORAL | 3 refills | Status: DC
  Filled 2021-10-28: qty 30, 30d supply, fill #0
  Filled 2021-11-26 (×2): qty 30, 30d supply, fill #1

## 2021-10-28 NOTE — Progress Notes (Signed)
[facility] MD/PA/NP OP Progress Note Virtual Visit via Video Note  I connected with [name] on 10/28/21 at  9:30 AM EDT by a video enabled telemedicine application and verified that I am speaking with the correct person using two identifiers.  Location: Patient: Home Provider: Clinic   I discussed the limitations of evaluation and management by telemedicine and the availability of in person appointments. The patient expressed understanding and agreed to proceed.  I provided 30 minutes of non-face-to-face time during this encounter.    10/28/2021 9:38 AM [name]  MRN:  [id number]  Chief Complaint: "I am up and down"  HPI: 41 year old male seen today for followup psychiatric evaluation. He has a psychiatric history of anxiety and depression.  He is currently managed on Zoloft 50 mg daily and Strattera 80 mg daily.  He notes his medications are effective in managing his psychiatric conditions.   Today he is well-groomed, pleasant,cooperative, engaged in conversation, maintained eye contact.  He informed provider that he has been up-and-down.  Patient notes that he has little energy because he recently got his toes amputated.  He denies being in pain however reports that he would like to get his fatigue under control.  Patient notes that his anxiety and depression are well managed on his medications.  Provider conducted GAD-7 and patient scored a 2.   Provider also conducted PHQ-9 he scored 7. Today he denies SI/HI/VAH, mania or paranoia.  He endorses adequate sleep and appetite.   No medication changes made today.  Patient will continue all medications as prescribed and follow-up with outpatient counseling for therapy.  No other concerns noted at this time.   Visit Diagnosis:    ICD-10-CM   1. Attention deficit hyperactivity disorder (ADHD), predominantly inattentive type  F90.0 atomoxetine (STRATTERA) 80 MG capsule    2. Moderate major depression, single episode (HCC)  F32.1  sertraline (ZOLOFT) 50 MG tablet      Past Psychiatric History: anxiety and depression  Past Medical History:  Past Medical History:  Diagnosis Date   ADHD    Anxiety    Bipolar disorder ([place])    Depression    Diabetes mellitus without complication ([place])    type 2   Heart murmur    as a child   HLD (hyperlipidemia)    Hypertension    no meds   Peripheral vascular disease ([place])     Past Surgical History:  Procedure Laterality Date   AMPUTATION Left 03/06/2020   Procedure: LEFT GREAT TOE AMPUTATION;  Surgeon: Newt Minion, MD;  Location: [facility];  Service: Orthopedics;  Laterality: Left;   AMPUTATION Right 10/15/2021   Procedure: RIGHT GREAT TOE AMPUTATION;  Surgeon: Newt Minion, MD;  Location: [facility];  Service: Orthopedics;  Laterality: Right;   EYE SURGERY Bilateral    lasik   WISDOM TOOTH EXTRACTION      Family Psychiatric History: Mother depression, father depression, brother depression, maternal uncles alcohol dependence  Family History:  Family History  Problem Relation Age of Onset   Diabetes Mother    Hyperlipidemia Father    Hypertension Father     Social History:  Social History   Socioeconomic History   Marital status: Single    Spouse name: Not on file   Number of children: Not on file   Years of education: Not on file   Highest education level: Not on file  Occupational History   Occupation: Unemployed  Tobacco Use   Smoking status: Never  Smokeless tobacco: Never  Vaping Use   Vaping Use: Never used  Substance and Sexual Activity   Alcohol use: Not Currently    Comment: last drink 2018   Drug use: Never   Sexual activity: Not on file  Other Topics Concern   Not on file  Social History Narrative   Live with parents in [place].    Social Determinants of Health   Financial Resource Strain: Not on file  Food Insecurity: Not on file  Transportation Needs: Not on file  Physical Activity: Not on file  Stress: Not on file  Social  Connections: Not on file    Allergies:  Allergies  Allergen Reactions   Milk-Related Compounds Diarrhea    Metabolic Disorder Labs: Lab Results  Component Value Date   HGBA1C 11.1 (H) 06/05/2021   MPG 291.96 03/03/2020   No results found for: PROLACTIN Lab Results  Component Value Date   CHOL 183 06/05/2021   TRIG 252 (H) 06/05/2021   HDL 32 (L) 06/05/2021   CHOLHDL 5.7 (H) 06/05/2021   LDLCALC 107 (H) 06/05/2021   LDLCALC 122 (H) 10/28/2020   No results found for: TSH  Therapeutic Level Labs: No results found for: LITHIUM No results found for: VALPROATE No components found for:  CBMZ  Current Medications: Current Outpatient Medications  Medication Sig Dispense Refill   doxycycline (VIBRA-TABS) 100 MG tablet Take 1 tablet (100 mg total) by mouth 2 (two) times daily. 60 tablet 0   atomoxetine (STRATTERA) 80 MG capsule Take 1 capsule (80 mg total) by mouth daily. 30 capsule 3   blood glucose meter kit and supplies Dispense based on patient and insurance preference. Use up to four times daily as directed. (FOR ICD-10 E10.9, E11.9). 1 each 0   Dulaglutide (TRULICITY) 1.5 CX/4.4YJ SOPN Inject 1.5 mg into the skin once a week. (Patient taking differently: Inject 1.5 mg into the skin every Friday.) 2 mL 2   Insulin Glargine (BASAGLAR KWIKPEN) 100 UNIT/ML Inject 40 Units into the skin daily. Increase by 2 units every 3 days until CBGs <130. Max: 50 units. (Patient taking differently: Inject 50 Units into the skin every evening.  Max: 50 units.) 15 mL 1   Insulin Pen Needle (PEN NEEDLES) 31G X 8 MM MISC UAD 100 each 6   metFORMIN (GLUCOPHAGE) 500 MG tablet Take 2 tablets (1,000 mg total) by mouth 2 (two) times daily with a meal. 120 tablet 2   oxyCODONE-acetaminophen (PERCOCET/ROXICET) 5-325 MG tablet Take 1 tablet by mouth every 4 (four) hours as needed. 30 tablet 0   pravastatin (PRAVACHOL) 40 MG tablet Take 1 tablet (40 mg total) by mouth once daily. 30 tablet 2   sertraline  (ZOLOFT) 50 MG tablet Take 1 tablet (50 mg total) by mouth daily. 30 tablet 3   No current facility-administered medications for this visit.     Musculoskeletal: Strength & Muscle Tone:  Unable to assess due to telehealth visit [place]:  Unable to assess due to telehealth visit Patient leans: N/A  Psychiatric Specialty Exam: Review of Systems  There were no vitals taken for this visit.There is no height or weight on file to calculate BMI.  General Appearance: Well Groomed  Eye Contact:  Good  Speech:  Clear and Coherent and Normal Rate  Volume:  Normal  Mood:  Euthymic  Affect:  Appropriate and Congruent  Thought Process:  Coherent, Goal Directed and Linear  Orientation:  Full (Time, Place, and Person)  Thought Content: WDL and  Logical   Suicidal Thoughts:  No  Homicidal Thoughts:  No  Memory:  Immediate;   Good Recent;   Good Remote;   Good  Judgement:  Good  Insight:  Good  Psychomotor Activity:  Normal  Concentration:  Concentration: Good and Attention Span: Good  Recall:  Good  Fund of Knowledge: Good  Language: Good  Akathisia:  No  Handed:  Right  AIMS (if indicated): Not done  Assets:  Communication Skills Desire for Improvement Financial Resources/Insurance Housing Leisure Time Physical Health Social Support  ADL's:  Intact  Cognition: WNL  Sleep:  Good   Screenings: GAD-7    Flowsheet Row Video Visit from 10/28/2021 in [facility] Video Visit from 04/30/2021 in [facility] Office Visit from 01/30/2021 in [place] Video Visit from 01/22/2021 in [name] Video Visit from 10/23/2020 in [facility]: [place] Rhonda W  Total GAD-7 Score _0 PHQ2-9    Flowsheet Row Video Visit from 10/28/2021 in [facility] Counselor from 07/24/2021 in [facility] Video Visit  from 04/30/2021 in [name] Office Visit from 01/30/2021 in [place] Video Visit from 01/22/2021 in [facility]  PHQ-2 Total Score _1 PHQ-9 Total Score _2 Flowsheet Row Video Visit from 10/28/2021 in [facility] Admission (Discharged) from 10/15/2021 in [place] Counselor from 07/24/2021 in [place] No Risk No Risk No Risk        Assessment and Plan: Patient notes that he is doing well on his current medication regimen.  No medication changes made today.  Patient will continue all medications as prescribed.  1. Attention deficit hyperactivity disorder (ADHD), predominantly inattentive type  Continue- atomoxetine (STRATTERA) 80 MG capsule; Take 1 capsule (80 mg total) by mouth daily.  Dispense: 30 capsule; Refill: 3  2. Moderate major depression, single episode (HCC)  Continue- sertraline (ZOLOFT) 50 MG tablet; Take 1 tablet (50 mg total) by mouth daily.  Dispense: 30 tablet; Refill: 3  Follow-up in 3 Follow-up therapy  Salley Slaughter, NP 10/28/2021, 9:38 AM

## 2021-10-29 ENCOUNTER — Other Ambulatory Visit (HOSPITAL_COMMUNITY): Payer: Self-pay

## 2021-11-03 ENCOUNTER — Ambulatory Visit (INDEPENDENT_AMBULATORY_CARE_PROVIDER_SITE_OTHER): Payer: Self-pay | Admitting: Orthopedic Surgery

## 2021-11-03 DIAGNOSIS — S98111A Complete traumatic amputation of right great toe, initial encounter: Secondary | ICD-10-CM

## 2021-11-03 DIAGNOSIS — Z89411 Acquired absence of right great toe: Secondary | ICD-10-CM

## 2021-11-11 ENCOUNTER — Encounter: Payer: Self-pay | Admitting: Orthopedic Surgery

## 2021-11-11 NOTE — Progress Notes (Signed)
Office Visit Note   Patient: [name]           Date of Birth: 12/28/1980           MRN: [id number] Visit Date: 11/03/2021              Requested by: Marcine Matar, MD [address] [place] [address] [place],  [place] [address] PCP: Marcine Matar, MD  Chief Complaint  Patient presents with   Right Foot - Routine Post Op    10/15/21 right great toe amputation      HPI: Patient is a 41 year old gentleman who presents 3 weeks status post right great toe amputation patient is still on doxycycline.  Assessment & Plan: Visit Diagnoses:  1. Amputated great toe, right (HCC)     Plan: Minimize weightbearing with Dial soap cleansing dry dressing change.  Follow-Up Instructions: Return in about 2 weeks (around 11/17/2021).   Ortho Exam  Patient is alert, oriented, no adenopathy, well-dressed, normal affect, normal respiratory effort. Examination there is slight wound dehiscence without cellulitis sutures are harvested.  Imaging: No results found. No images are attached to the encounter.  Labs: Lab Results  Component Value Date   HGBA1C 11.1 (H) 06/05/2021   HGBA1C 7.7 (A) 12/30/2020   HGBA1C 9.1 (A) 09/27/2020   ESRSEDRATE 48 (H) 03/21/2020   ESRSEDRATE 81 (H) 03/05/2020   CRP 5.6 03/21/2020   CRP 9.0 (H) 03/05/2020   LABURIC 4.6 06/26/2021   LABURIC 5.1 02/18/2021   REPTSTATUS 03/09/2020 FINAL 03/04/2020   CULT  03/04/2020    NO GROWTH 5 DAYS Performed at [facility], [address], [place], [place] [address]    LABORGA GROUP B STREP(S.AGALACTIAE)ISOLATED 03/02/2020     Lab Results  Component Value Date   ALBUMIN 4.6 06/05/2021   ALBUMIN 4.6 10/28/2020   ALBUMIN 4.3 03/26/2020    No results found for: "MG" No results found for: "VD25OH"  No results found for: "PREALBUMIN"    Latest Ref Rng & Units 10/15/2021    7:26 AM 08/09/2020    6:26 AM 03/08/2020   12:02 AM  CBC EXTENDED  WBC 4.0 - 10.5 K/uL 10.0  14.8  6.8   RBC 4.22 - 5.81  MIL/uL 5.57  5.59  4.71   Hemoglobin 13.0 - 17.0 g/dL 92.4  26.8  34.1   HCT 39.0 - 52.0 % 46.9  46.7  39.7   Platelets 150 - 400 K/uL 275  275  349   NEUT# 1.7 - 7.7 K/uL   3.9   Lymph# 0.7 - 4.0 K/uL   1.9      There is no height or weight on file to calculate BMI.  Orders:  No orders of the defined types were placed in this encounter.  No orders of the defined types were placed in this encounter.    Procedures: No procedures performed  Clinical Data: No additional findings.  ROS:  All other systems negative, except as noted in the HPI. Review of Systems  Objective: Vital Signs: There were no vitals taken for this visit.  Specialty Comments:  No specialty comments available.  PMFS History: Patient Active Problem List   Diagnosis Date Noted   Osteomyelitis of great toe of right foot (HCC)    Bipolar depression (HCC) 09/10/2021   Adjustment disorder with mixed anxiety and depressed mood 09/10/2021   Attention deficit hyperactivity disorder (ADHD), predominantly inattentive type 07/30/2020   Anxiety with depression 06/07/2020   Hyperlipidemia  due to type 2 diabetes mellitus (HCC) 03/27/2020   Status post amputation of left great toe (HCC) 03/26/2020   Influenza vaccination declined 03/26/2020   23-polyvalent pneumococcal polysaccharide vaccine declined 03/26/2020   COVID-19 vaccine series completed 03/26/2020   Moderate major depression, single episode (HCC) 03/26/2020   Anaerobic bacteremia 03/07/2020   Major depressive disorder, recurrent episode, moderate (HCC) 03/04/2020   Abscess of great toe, left    Sepsis due to group B Streptococcus with acute renal failure (HCC)    Cellulitis of left toe 03/03/2020   Diabetic ketoacidosis (HCC) 03/03/2020   DKA (diabetic ketoacidosis) (HCC) 03/03/2020   Ulcer of right foot with fat layer exposed (HCC) 06/06/2019   Non-pressure chronic ulcer of other part of left foot limited to breakdown of skin (HCC) 06/06/2019    Uncontrolled type 2 diabetes mellitus with hyperglycemia (HCC) 05/02/2018   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) 07/30/2017   Venous insufficiency of both lower extremities 07/30/2017   Obesity, Class III, BMI 40-49.9 (morbid obesity) (HCC) 07/30/2017   Essential hypertension 09/03/2016   Past Medical History:  Diagnosis Date   ADHD    Anxiety    Bipolar disorder (HCC)    Depression    Diabetes mellitus without complication (HCC)    type 2   Heart murmur    as a child   HLD (hyperlipidemia)    Hypertension    no meds   Peripheral vascular disease (HCC)     Family History  Problem Relation Age of Onset   Diabetes Mother    Hyperlipidemia Father    Hypertension Father     Past Surgical History:  Procedure Laterality Date   AMPUTATION Left 03/06/2020   Procedure: LEFT GREAT TOE AMPUTATION;  Surgeon: [name], MD;  Location: [facility] OR;  Service: Orthopedics;  Laterality: Left;   AMPUTATION Right 10/15/2021   Procedure: RIGHT GREAT TOE AMPUTATION;  Surgeon: [name], MD;  Location: [facility] OR;  Service: Orthopedics;  Laterality: Right;   EYE SURGERY Bilateral    lasik   WISDOM TOOTH EXTRACTION     Social History   Occupational History   Occupation: Unemployed  Tobacco Use   Smoking status: Never   Smokeless tobacco: Never  Vaping Use   Vaping Use: Never used  Substance and Sexual Activity   Alcohol use: Not Currently    Comment: last drink 2018   Drug use: Never   Sexual activity: Not on file

## 2021-11-15 ENCOUNTER — Other Ambulatory Visit: Payer: Self-pay | Admitting: Internal Medicine

## 2021-11-15 ENCOUNTER — Other Ambulatory Visit (HOSPITAL_COMMUNITY): Payer: Self-pay

## 2021-11-15 DIAGNOSIS — E1165 Type 2 diabetes mellitus with hyperglycemia: Secondary | ICD-10-CM

## 2021-11-17 ENCOUNTER — Ambulatory Visit: Payer: Self-pay | Admitting: Orthopedic Surgery

## 2021-11-17 ENCOUNTER — Other Ambulatory Visit (HOSPITAL_COMMUNITY): Payer: Self-pay

## 2021-11-17 MED ORDER — TRULICITY 1.5 MG/0.5ML ~~LOC~~ SOAJ
1.5000 mg | SUBCUTANEOUS | 2 refills | Status: DC
  Filled 2021-11-17 – 2021-11-26 (×3): qty 2, 28d supply, fill #0

## 2021-11-17 MED ORDER — BASAGLAR KWIKPEN 100 UNIT/ML ~~LOC~~ SOPN
40.0000 [IU] | PEN_INJECTOR | Freq: Every day | SUBCUTANEOUS | 1 refills | Status: DC
  Filled 2021-11-17 – 2021-11-26 (×2): qty 15, 37d supply, fill #0
  Filled 2021-11-26: qty 15, 30d supply, fill #0

## 2021-11-18 ENCOUNTER — Ambulatory Visit (INDEPENDENT_AMBULATORY_CARE_PROVIDER_SITE_OTHER): Payer: Self-pay | Admitting: Orthopedic Surgery

## 2021-11-18 DIAGNOSIS — S98111A Complete traumatic amputation of right great toe, initial encounter: Secondary | ICD-10-CM

## 2021-11-18 DIAGNOSIS — Z89411 Acquired absence of right great toe: Secondary | ICD-10-CM

## 2021-11-19 ENCOUNTER — Ambulatory Visit (INDEPENDENT_AMBULATORY_CARE_PROVIDER_SITE_OTHER): Payer: No Payment, Other | Admitting: Licensed Clinical Social Worker

## 2021-11-19 ENCOUNTER — Telehealth (HOSPITAL_COMMUNITY): Payer: Self-pay | Admitting: Psychiatry

## 2021-11-19 DIAGNOSIS — F900 Attention-deficit hyperactivity disorder, predominantly inattentive type: Secondary | ICD-10-CM

## 2021-11-19 DIAGNOSIS — F3341 Major depressive disorder, recurrent, in partial remission: Secondary | ICD-10-CM

## 2021-11-19 NOTE — Telephone Encounter (Signed)
Pt asked that you call him... he has some concernes about his atomoxetine.

## 2021-11-19 NOTE — Progress Notes (Signed)
   THERAPIST PROGRESS NOTE  Session Time: 17 minutes  Participation Level: Active  Behavioral Response: CasualAlertEuthymic  Type of Therapy: Individual Therapy  Treatment Goals addressed: medication compliance  ProgressTowards Goals: Progressing  Interventions: Motivational Interviewing and Supportive  Summary: [name] is a 41 y.o. male who presents for f/u with this cln. He arrives on time and maintains good eye contact throughout the session. He reports his mood has been "even" and endorses a depressive episode two weeks ago that lasted for two days. He describes dissociating during these times and will "lose time" sometimes up to 40 minutes. Additionally, he endorses decreased motivation during these periods. He states such episodes are most often triggered by him forgetting to take medications for a day or two and that he has increased his ability to recognize the signs and to take his medication sooner than previously. He shares that he has increased medication compliance by getting a pill box and using alarms and estimates he remembers to take them 95% of the time. He states an app that could alert him on his TV would be most helpful to increase this to 100%. He reports he had a toe amputated a month ago and states his recovery has gone well. He also states his mother would like him to speak to cln about SSD, to which cln responds must be navigated with MDs. Pt verbalizes understanding of this. He reports some insomnia and a decrease in negative self-talk. He states he may need an increase in ADHD medications and denies adverse side effects.   Suicidal/Homicidal: Nowithout intent/plan  Therapist Response: Cln assessed for current stressors, symptoms, and safety since last session. Cln utilized active listening and validation to assist with processing. Cln verbalized progress made and assisted him with scheduling sooner med man appt. Cln scheduled follow-up appointment and  confirmed pt's availability and preferred method of service delivery (in-person).  Plan: Return again in 3 weeks.  Diagnosis: Attention deficit hyperactivity disorder (ADHD), predominantly inattentive type  MDD (major depressive disorder), recurrent, in partial remission (HCC)  Collaboration of Care: Medication Management AEB assisted pt with scheduling med man appt  Patient/Guardian was advised Release of Information must be obtained prior to any record release in order to collaborate their care with an outside provider. Patient/Guardian was advised if they have not already done so to contact the registration department to sign all necessary forms in order for [name] to release information regarding their care.   Consent: Patient/Guardian gives verbal consent for treatment and assignment of benefits for services provided during this visit. Patient/Guardian expressed understanding and agreed to proceed.   Wyvonnia Lora, LCSWA 11/19/2021

## 2021-11-19 NOTE — Telephone Encounter (Signed)
Patient asked writer what the next dose of Strattera would be.  Provider informed patient that [name] goes to 100 mg.  He notes that he is having some issues with concentration but notes that it is minimal.  At this time he would like to continue Strattera as originally prescribed at 80 mg.  No other concerns at this time.

## 2021-11-26 ENCOUNTER — Encounter: Payer: Self-pay | Admitting: Orthopedic Surgery

## 2021-11-26 ENCOUNTER — Other Ambulatory Visit: Payer: Self-pay

## 2021-11-26 NOTE — Progress Notes (Signed)
Office Visit Note   Patient: [name]           Date of Birth: Oct 26, 1980           MRN: [id number] Visit Date: 11/18/2021              Requested by: Marcine Matar, MD [address] [place] [address] [place],  [place] [address] PCP: Marcine Matar, MD  Chief Complaint  Patient presents with   Right Foot - Routine Post Op    10/15/2021 right GT amputation       HPI: Patient is a 41 year old gentleman who is seen 4 weeks status post amputation right great toe he is in a postoperative shoe full weightbearing.  Assessment & Plan: Visit Diagnoses:  1. Amputation of right great toe [facility])     Plan: We will have patient resume his doxycycline reevaluate in 1 week.  Follow-Up Instructions: Return in about 1 week (around 11/25/2021).   Ortho Exam  Patient is alert, oriented, no adenopathy, well-dressed, normal affect, normal respiratory effort. Examination patient has a strong dorsalis pedis pulse there is no cellulitis there is clear drainage with a very small open wound 1 mm in diameter.  Imaging: No results found. No images are attached to the encounter.  Labs: Lab Results  Component Value Date   HGBA1C 11.1 (H) 06/05/2021   HGBA1C 7.7 (A) 12/30/2020   HGBA1C 9.1 (A) 09/27/2020   ESRSEDRATE 48 (H) 03/21/2020   ESRSEDRATE 81 (H) 03/05/2020   CRP 5.6 03/21/2020   CRP 9.0 (H) 03/05/2020   LABURIC 4.6 06/26/2021   LABURIC 5.1 02/18/2021   REPTSTATUS 03/09/2020 FINAL 03/04/2020   CULT  03/04/2020    NO GROWTH 5 DAYS Performed at [facility], [address] [place], [place] [address]    LABORGA GROUP B STREP(S.AGALACTIAE)ISOLATED 03/02/2020     Lab Results  Component Value Date   ALBUMIN 4.6 06/05/2021   ALBUMIN 4.6 10/28/2020   ALBUMIN 4.3 03/26/2020    No results found for: "MG" No results found for: "VD25OH"  No results found for: "PREALBUMIN"    Latest Ref Rng & Units 10/15/2021    7:26 AM 08/09/2020    6:26 AM 03/08/2020   12:02 AM   CBC EXTENDED  WBC 4.0 - 10.5 K/uL 10.0  14.8  6.8   RBC 4.22 - 5.81 MIL/uL 5.57  5.59  4.71   Hemoglobin 13.0 - 17.0 g/dL 09.3  23.5  57.3   HCT 39.0 - 52.0 % 46.9  46.7  39.7   Platelets 150 - 400 K/uL 275  275  349   NEUT# 1.7 - 7.7 K/uL   3.9   Lymph# 0.7 - 4.0 K/uL   1.9      There is no height or weight on file to calculate BMI.  Orders:  No orders of the defined types were placed in this encounter.  No orders of the defined types were placed in this encounter.    Procedures: No procedures performed  Clinical Data: No additional findings.  ROS:  All other systems negative, except as noted in the HPI. Review of Systems  Objective: Vital Signs: There were no vitals taken for this visit.  Specialty Comments:  No specialty comments available.  PMFS History: Patient Active Problem List   Diagnosis Date Noted   MDD (major depressive disorder), recurrent, in partial remission (HCC) 11/19/2021   Osteomyelitis of great toe of right foot (HCC)    Bipolar  depression (HCC) 09/10/2021   Adjustment disorder with mixed anxiety and depressed mood 09/10/2021   Attention deficit hyperactivity disorder (ADHD), predominantly inattentive type 07/30/2020   Anxiety with depression 06/07/2020   Hyperlipidemia due to type 2 diabetes mellitus (HCC) 03/27/2020   Status post amputation of left great toe (HCC) 03/26/2020   Influenza vaccination declined 03/26/2020   23-polyvalent pneumococcal polysaccharide vaccine declined 03/26/2020   COVID-19 vaccine series completed 03/26/2020   Moderate major depression, single episode (HCC) 03/26/2020   Anaerobic bacteremia 03/07/2020   Major depressive disorder, recurrent episode, moderate (HCC) 03/04/2020   Abscess of great toe, left    Sepsis due to group B Streptococcus with acute renal failure (HCC)    Cellulitis of left toe 03/03/2020   Diabetic ketoacidosis (HCC) 03/03/2020   DKA (diabetic ketoacidosis) (HCC) 03/03/2020   Ulcer of  right foot with fat layer exposed (HCC) 06/06/2019   Non-pressure chronic ulcer of other part of left foot limited to breakdown of skin (HCC) 06/06/2019   Uncontrolled type 2 diabetes mellitus with hyperglycemia (HCC) 05/02/2018   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) 07/30/2017   Venous insufficiency of both lower extremities 07/30/2017   Obesity, Class III, BMI 40-49.9 (morbid obesity) (HCC) 07/30/2017   Essential hypertension 09/03/2016   Past Medical History:  Diagnosis Date   ADHD    Anxiety    Bipolar disorder (HCC)    Depression    Diabetes mellitus without complication (HCC)    type 2   Heart murmur    as a child   HLD (hyperlipidemia)    Hypertension    no meds   Peripheral vascular disease (HCC)     Family History  Problem Relation Age of Onset   Diabetes Mother    Hyperlipidemia Father    Hypertension Father     Past Surgical History:  Procedure Laterality Date   AMPUTATION Left 03/06/2020   Procedure: LEFT GREAT TOE AMPUTATION;  Surgeon: [name], MD;  Location: [facility] OR;  Service: Orthopedics;  Laterality: Left;   AMPUTATION Right 10/15/2021   Procedure: RIGHT GREAT TOE AMPUTATION;  Surgeon: [name], MD;  Location: [facility] OR;  Service: Orthopedics;  Laterality: Right;   EYE SURGERY Bilateral    lasik   WISDOM TOOTH EXTRACTION     Social History   Occupational History   Occupation: Unemployed  Tobacco Use   Smoking status: Never   Smokeless tobacco: Never  Vaping Use   Vaping Use: Never used  Substance and Sexual Activity   Alcohol use: Not Currently    Comment: last drink 2018   Drug use: Never   Sexual activity: Not on file

## 2021-11-27 ENCOUNTER — Ambulatory Visit (INDEPENDENT_AMBULATORY_CARE_PROVIDER_SITE_OTHER): Payer: Self-pay | Admitting: Orthopedic Surgery

## 2021-11-27 DIAGNOSIS — Z89411 Acquired absence of right great toe: Secondary | ICD-10-CM

## 2021-11-27 DIAGNOSIS — S98111A Complete traumatic amputation of right great toe, initial encounter: Secondary | ICD-10-CM

## 2021-12-10 ENCOUNTER — Ambulatory Visit (INDEPENDENT_AMBULATORY_CARE_PROVIDER_SITE_OTHER): Payer: No Payment, Other | Admitting: Licensed Clinical Social Worker

## 2021-12-10 DIAGNOSIS — F900 Attention-deficit hyperactivity disorder, predominantly inattentive type: Secondary | ICD-10-CM

## 2021-12-10 NOTE — Progress Notes (Signed)
   THERAPIST PROGRESS NOTE  Session Time: 16 minutes  Participation Level: Active  Behavioral Response: CasualAlertEuthymic and restless  Type of Therapy: Individual Therapy  Treatment Goals addressed: medication compliance  ProgressTowards Goals: Progressing  Interventions: Motivational Interviewing  Summary: [name] is a 41 y.o. male who presents for f/u with this cln. He arrives on time and maintains good eye contact throughout the session. He reports he is, "same as usual." Mood has been "steady." Medication compoliance has been "slightly shaky." He states he struggles with remembering to take diabetes meds due to them being twice daily. He reports better compliance with psych meds, as he takes them at night and alarms have helped. He reports 95% compliance with psych meds. He denies significant stressors at this time. He reports his sleep has been "steady" and his appetite is "under control." He shares that he has always struggled with his weight and describes how he lost a significant amount of weight while working for [employer] years ago. He also reports his foot has healed well and that he is still getting accustomed to walking on it. He denies having needs at this time and agrees to end the session  Suicidal/Homicidal: Nowithout intent/plan  Therapist Response: Cln assessed for current stressors, symptoms, and safety since last session. Cln utilized active listening and validation to assist with processing. Cln confirmed scheduled follow-up appointment and advised pt to call office to inquire about late cancellations/no-shows if he needs to be seen sooner.  Plan: Return again in 8 weeks (next available appt).  Diagnosis: Attention deficit hyperactivity disorder (ADHD), predominantly inattentive type  Collaboration of Care: Other none required  Patient/Guardian was advised Release of Information must be obtained prior to any record release in order to collaborate their care  with an outside provider. Patient/Guardian was advised if they have not already done so to contact the registration department to sign all necessary forms in order for [name] to release information regarding their care.   Consent: Patient/Guardian gives verbal consent for treatment and assignment of benefits for services provided during this visit. Patient/Guardian expressed understanding and agreed to proceed.   Wyvonnia Lora, LCSWA 12/10/2021

## 2021-12-19 ENCOUNTER — Encounter: Payer: Self-pay | Admitting: Orthopedic Surgery

## 2021-12-19 NOTE — Progress Notes (Signed)
Office Visit Note   Patient: [name]           Date of Birth: 09/11/1980           MRN: [id number] Visit Date: 11/27/2021              Requested by: Marcine Matar, MD [address] [place] [address] [place],  [place] [address] PCP: Marcine Matar, MD  Chief Complaint  Patient presents with   Right Foot - Routine Post Op    10/15/21 right GT amputation      HPI: Patient presents in follow-up status post amputation right great toe.  Assessment & Plan: Visit Diagnoses:  1. Amputation of right great toe [facility])     Plan: Continue with current wound care.  Follow-Up Instructions: No follow-ups on file.   Ortho Exam  Patient is alert, oriented, no adenopathy, well-dressed, normal affect, normal respiratory effort. Examination patient is a good pulse there is no cellulitis there is a small open wound.  Imaging: No results found. No images are attached to the encounter.  Labs: Lab Results  Component Value Date   HGBA1C 11.1 (H) 06/05/2021   HGBA1C 7.7 (A) 12/30/2020   HGBA1C 9.1 (A) 09/27/2020   ESRSEDRATE 48 (H) 03/21/2020   ESRSEDRATE 81 (H) 03/05/2020   CRP 5.6 03/21/2020   CRP 9.0 (H) 03/05/2020   LABURIC 4.6 06/26/2021   LABURIC 5.1 02/18/2021   REPTSTATUS 03/09/2020 FINAL 03/04/2020   CULT  03/04/2020    NO GROWTH 5 DAYS Performed at [facility], [address], [place], [place] [address]    LABORGA GROUP B STREP(S.AGALACTIAE)ISOLATED 03/02/2020     Lab Results  Component Value Date   ALBUMIN 4.6 06/05/2021   ALBUMIN 4.6 10/28/2020   ALBUMIN 4.3 03/26/2020    No results found for: "MG" No results found for: "VD25OH"  No results found for: "PREALBUMIN"    Latest Ref Rng & Units 10/15/2021    7:26 AM 08/09/2020    6:26 AM 03/08/2020   12:02 AM  CBC EXTENDED  WBC 4.0 - 10.5 K/uL 10.0  14.8  6.8   RBC 4.22 - 5.81 MIL/uL 5.57  5.59  4.71   Hemoglobin 13.0 - 17.0 g/dL 69.6  78.9  38.1   HCT 39.0 - 52.0 % 46.9  46.7  39.7    Platelets 150 - 400 K/uL 275  275  349   NEUT# 1.7 - 7.7 K/uL   3.9   Lymph# 0.7 - 4.0 K/uL   1.9      There is no height or weight on file to calculate BMI.  Orders:  No orders of the defined types were placed in this encounter.  No orders of the defined types were placed in this encounter.    Procedures: No procedures performed  Clinical Data: No additional findings.  ROS:  All other systems negative, except as noted in the HPI. Review of Systems  Objective: Vital Signs: There were no vitals taken for this visit.  Specialty Comments:  No specialty comments available.  PMFS History: Patient Active Problem List   Diagnosis Date Noted   MDD (major depressive disorder), recurrent, in partial remission (HCC) 11/19/2021   Osteomyelitis of great toe of right foot (HCC)    Bipolar depression (HCC) 09/10/2021   Adjustment disorder with mixed anxiety and depressed mood 09/10/2021   Attention deficit hyperactivity disorder (ADHD), predominantly inattentive type 07/30/2020   Anxiety with depression 06/07/2020   Hyperlipidemia  due to type 2 diabetes mellitus (HCC) 03/27/2020   Status post amputation of left great toe (HCC) 03/26/2020   Influenza vaccination declined 03/26/2020   23-polyvalent pneumococcal polysaccharide vaccine declined 03/26/2020   COVID-19 vaccine series completed 03/26/2020   Moderate major depression, single episode (HCC) 03/26/2020   Anaerobic bacteremia 03/07/2020   Major depressive disorder, recurrent episode, moderate (HCC) 03/04/2020   Abscess of great toe, left    Sepsis due to group B Streptococcus with acute renal failure (HCC)    Cellulitis of left toe 03/03/2020   Diabetic ketoacidosis (HCC) 03/03/2020   DKA (diabetic ketoacidosis) (HCC) 03/03/2020   Ulcer of right foot with fat layer exposed (HCC) 06/06/2019   Non-pressure chronic ulcer of other part of left foot limited to breakdown of skin (HCC) 06/06/2019   Uncontrolled type 2 diabetes  mellitus with hyperglycemia (HCC) 05/02/2018   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) 07/30/2017   Venous insufficiency of both lower extremities 07/30/2017   Obesity, Class III, BMI 40-49.9 (morbid obesity) (HCC) 07/30/2017   Essential hypertension 09/03/2016   Past Medical History:  Diagnosis Date   ADHD    Anxiety    Bipolar disorder (HCC)    Depression    Diabetes mellitus without complication (HCC)    type 2   Heart murmur    as a child   HLD (hyperlipidemia)    Hypertension    no meds   Peripheral vascular disease (HCC)     Family History  Problem Relation Age of Onset   Diabetes Mother    Hyperlipidemia Father    Hypertension Father     Past Surgical History:  Procedure Laterality Date   AMPUTATION Left 03/06/2020   Procedure: LEFT GREAT TOE AMPUTATION;  Surgeon: [name], MD;  Location: [facility] OR;  Service: Orthopedics;  Laterality: Left;   AMPUTATION Right 10/15/2021   Procedure: RIGHT GREAT TOE AMPUTATION;  Surgeon: [name], MD;  Location: [facility] OR;  Service: Orthopedics;  Laterality: Right;   EYE SURGERY Bilateral    lasik   WISDOM TOOTH EXTRACTION     Social History   Occupational History   Occupation: Unemployed  Tobacco Use   Smoking status: Never   Smokeless tobacco: Never  Vaping Use   Vaping Use: Never used  Substance and Sexual Activity   Alcohol use: Not Currently    Comment: last drink 2018   Drug use: Never   Sexual activity: Not on file

## 2021-12-23 ENCOUNTER — Ambulatory Visit: Payer: Self-pay | Attending: Internal Medicine | Admitting: Internal Medicine

## 2021-12-23 ENCOUNTER — Other Ambulatory Visit: Payer: Self-pay

## 2021-12-23 ENCOUNTER — Encounter: Payer: Self-pay | Admitting: Internal Medicine

## 2021-12-23 DIAGNOSIS — E785 Hyperlipidemia, unspecified: Secondary | ICD-10-CM

## 2021-12-23 DIAGNOSIS — E1169 Type 2 diabetes mellitus with other specified complication: Secondary | ICD-10-CM

## 2021-12-23 LAB — POCT GLYCOSYLATED HEMOGLOBIN (HGB A1C): HbA1c, POC (controlled diabetic range): 11.2 % — AB (ref 0.0–7.0)

## 2021-12-23 LAB — GLUCOSE, POCT (MANUAL RESULT ENTRY): POC Glucose: 337 mg/dl — AB (ref 70–99)

## 2021-12-23 MED ORDER — TRULICITY 1.5 MG/0.5ML ~~LOC~~ SOAJ
1.5000 mg | SUBCUTANEOUS | 6 refills | Status: DC
  Filled 2021-12-23: qty 2, 28d supply, fill #0
  Filled 2022-02-06: qty 2, 28d supply, fill #1
  Filled 2022-03-26: qty 2, 28d supply, fill #2
  Filled 2022-06-24 – 2022-07-07 (×3): qty 2, 28d supply, fill #3
  Filled 2022-11-03: qty 2, 28d supply, fill #4
  Filled 2022-12-08 – 2022-12-11 (×3): qty 2, 28d supply, fill #5

## 2021-12-23 MED ORDER — BASAGLAR KWIKPEN 100 UNIT/ML ~~LOC~~ SOPN
40.0000 [IU] | PEN_INJECTOR | Freq: Every day | SUBCUTANEOUS | 6 refills | Status: DC
  Filled 2021-12-23: qty 15, 30d supply, fill #0

## 2021-12-23 MED ORDER — BASAGLAR KWIKPEN 100 UNIT/ML ~~LOC~~ SOPN
48.0000 [IU] | PEN_INJECTOR | Freq: Every day | SUBCUTANEOUS | 6 refills | Status: DC
  Filled 2021-12-23: qty 15, 30d supply, fill #0
  Filled 2022-02-06: qty 15, 30d supply, fill #1
  Filled 2022-03-26: qty 15, 30d supply, fill #2
  Filled 2022-06-24: qty 15, 30d supply, fill #3
  Filled 2022-11-03: qty 15, 30d supply, fill #4

## 2021-12-23 NOTE — Progress Notes (Signed)
Patient ID: [name], male    DOB: [birth year]  MRN: [id number]  CC: Diabetes   Subjective: [name] is a 41 y.o. male who presents for chronic ds management His concerns today include:  Pt with hx of DM type 2, HL, HTN (off meds x 15 yrs) obesity, DM foot ulcers, amputation LT big toe (02/2020), MDD, obesity,   Since last visit with me, patient had right big toe amputated due to osteomyelitis and nonhealing foot ulcer.  Surgical site has healed completely.  Reports he has a few on his left foot that are healing up.  He saw orthopedics about 1 month ago.  DM/Obese: Results for orders placed or performed in visit on 12/23/21  HgB A1c  Result Value Ref Range   Hemoglobin A1C     HbA1c POC (<> result, manual entry)     HbA1c, POC (prediabetic range)     HbA1c, POC (controlled diabetic range) 11.2 (A) 0.0 - 7.0 %  Glucose (CBG)  Result Value Ref Range   POC Glucose 337 (A) 70 - 99 mg/dl  Saw the clinical pharmacist since last visit.  He was not taking the NovoLog mix twice daily consistently so he changed him to glargine 40 units daily.  Patient continued with Trulicity 1.5 mg once a week and metformin 1 g twice a day.  Reports being out of Trulicity for 3 weeks in June and early July.  Reports taking the glargine insulin 40 units consistently.  He forgets to take his morning dose of metformin almost daily. Not checking blood sugars consistently. Trying to do better with his eating habits.  However he drinks regular sodas.  Also drinks diet tea. Not getting in much exercise but plans to start walking some in the mornings. Overdue for eye exam.  No insurance. HL: Reports compliance with taking pravastatin.  Last LDL was 107.  Patient Active Problem List   Diagnosis Date Noted   MDD (major depressive disorder), recurrent, in partial remission ([place]) 11/19/2021   Osteomyelitis of great toe of right foot ([place])    Bipolar depression ([place]) 09/10/2021   Adjustment  disorder with mixed anxiety and depressed mood 09/10/2021   Attention deficit hyperactivity disorder (ADHD), predominantly inattentive type 07/30/2020   Anxiety with depression 06/07/2020   Hyperlipidemia due to type 2 diabetes mellitus ([place]) 03/27/2020   Status post amputation of left great toe ([place]) 03/26/2020   Influenza vaccination declined 03/26/2020   23-polyvalent pneumococcal polysaccharide vaccine declined 03/26/2020   COVID-19 vaccine series completed 03/26/2020   Moderate major depression, single episode ([place]) 03/26/2020   Anaerobic bacteremia 03/07/2020   Major depressive disorder, recurrent episode, moderate ([place]) 03/04/2020   Abscess of great toe, left    Sepsis due to group B Streptococcus with acute renal failure (HCC)    Cellulitis of left toe 03/03/2020   Diabetic ketoacidosis ([place]) 03/03/2020   DKA (diabetic ketoacidosis) ([place]) 03/03/2020   Ulcer of right foot with fat layer exposed ([place]) 06/06/2019   Non-pressure chronic ulcer of other part of left foot limited to breakdown of skin ([place]) 06/06/2019   Uncontrolled type 2 diabetes mellitus with hyperglycemia ([place]) 05/02/2018   Diabetic ulcer of left great toe ([place]) 07/30/2017   Venous insufficiency of both lower extremities 07/30/2017   Obesity, Class III, BMI 40-49.9 (morbid obesity) ([place]) 07/30/2017   Essential hypertension 09/03/2016     Current Outpatient Medications on File Prior to Visit  Medication Sig Dispense Refill   atomoxetine (STRATTERA) 80 MG  capsule Take 1 capsule (80 mg total) by mouth daily. 30 capsule 3   blood glucose meter kit and supplies Dispense based on patient and insurance preference. Use up to four times daily as directed. (FOR ICD-10 E10.9, E11.9). 1 each 0   Dulaglutide (TRULICITY) 1.5 IR/4.4RX SOPN Inject 1.5 mg into the skin once a week. 2 mL 2   Insulin Glargine (BASAGLAR KWIKPEN) 100 UNIT/ML Inject 40 Units into the skin daily. Increase by 2 units every 3 days until CBGs <130. Max: 50  units. 15 mL 1   Insulin Pen Needle (PEN NEEDLES) 31G X 8 MM MISC UAD 100 each 6   metFORMIN (GLUCOPHAGE) 500 MG tablet Take 2 tablets (1,000 mg total) by mouth 2 (two) times daily with a meal. 120 tablet 2   sertraline (ZOLOFT) 50 MG tablet Take 1 tablet (50 mg total) by mouth daily. 30 tablet 3   pravastatin (PRAVACHOL) 40 MG tablet Take 1 tablet (40 mg total) by mouth once daily. 30 tablet 2   No current facility-administered medications on file prior to visit.    Allergies  Allergen Reactions   Milk-Related Compounds Diarrhea    Social History   Socioeconomic History   Marital status: Single    Spouse name: Not on file   Number of children: Not on file   Years of education: Not on file   Highest education level: Not on file  Occupational History   Occupation: Unemployed  Tobacco Use   Smoking status: Never   Smokeless tobacco: Never  Vaping Use   Vaping Use: Never used  Substance and Sexual Activity   Alcohol use: Not Currently    Comment: last drink 2018   Drug use: Never   Sexual activity: Not on file  Other Topics Concern   Not on file  Social History Narrative   Live with parents in [place].    Social Determinants of Health   Financial Resource Strain: Not on file  Food Insecurity: Not on file  Transportation Needs: Not on file  Physical Activity: Not on file  Stress: Not on file  Social Connections: Not on file  Intimate Partner Violence: Not on file    Family History  Problem Relation Age of Onset   Diabetes Mother    Hyperlipidemia Father    Hypertension Father     Past Surgical History:  Procedure Laterality Date   AMPUTATION Left 03/06/2020   Procedure: LEFT GREAT TOE AMPUTATION;  Surgeon: Newt Minion, MD;  Location: [facility];  Service: Orthopedics;  Laterality: Left;   AMPUTATION Right 10/15/2021   Procedure: RIGHT GREAT TOE AMPUTATION;  Surgeon: Newt Minion, MD;  Location: [facility];  Service: Orthopedics;  Laterality: Right;   EYE  SURGERY Bilateral    lasik   WISDOM TOOTH EXTRACTION      ROS: Review of Systems Negative except as stated above  PHYSICAL EXAM: BP 119/83 (BP Location: Left Arm, Patient Position: Sitting, Cuff Size: Large)   Pulse (!) 108   Temp 98.1 F (36.7 C) (Oral)   Resp 16   Ht '6\' 1"'  (1.854 m)   Wt (!) 337 lb (152.9 kg)   SpO2 97%   BMI 44.46 kg/m   Physical Exam  General appearance - alert, well appearing, morbidly obese middle-age Caucasian male and in no distress Mental status - normal mood, behavior, speech, dress, motor activity, and thought processes Chest - clear to auscultation, no wheezes, rales or rhonchi, symmetric air entry Heart - normal rate,  regular rhythm, normal S1, S2, no murmurs, rubs, clicks or gallops Extremities - peripheral pulses normal, no pedal edema, no clubbing or cyanosis      Latest Ref Rng & Units 10/15/2021    7:26 AM 06/05/2021   11:14 AM 10/28/2020   10:32 AM  CMP  Glucose 70 - 99 mg/dL 363  510  262   BUN 6 - 20 mg/dL '12  9  11   ' Creatinine 0.61 - 1.24 mg/dL 0.96  0.83  0.85   Sodium 135 - 145 mmol/L 135  136  138   Potassium 3.5 - 5.1 mmol/L 4.2  4.4  4.4   Chloride 98 - 111 mmol/L 102  96  99   CO2 22 - 32 mmol/L '23  21  23   ' Calcium 8.9 - 10.3 mg/dL 8.8  9.3  9.6   Total Protein 6.0 - 8.5 g/dL  8.2  7.9   Total Bilirubin 0.0 - 1.2 mg/dL  0.3  0.3   Alkaline Phos 44 - 121 IU/L  131  102   AST 0 - 40 IU/L  17  14   ALT 0 - 44 IU/L  19  17    Lipid Panel     Component Value Date/Time   CHOL 183 06/05/2021 1114   TRIG 252 (H) 06/05/2021 1114   HDL 32 (L) 06/05/2021 1114   CHOLHDL 5.7 (H) 06/05/2021 1114   LDLCALC 107 (H) 06/05/2021 1114    CBC    Component Value Date/Time   WBC 10.0 10/15/2021 0726   RBC 5.57 10/15/2021 0726   HGB 15.0 10/15/2021 0726   HGB 15.0 06/04/2016 1030   HCT 46.9 10/15/2021 0726   HCT 45.8 06/04/2016 1030   PLT 275 10/15/2021 0726   PLT 254 06/04/2016 1030   MCV 84.2 10/15/2021 0726   MCV 82.6  05/02/2018 1521   MCV 83 06/04/2016 1030   MCH 26.9 10/15/2021 0726   MCHC 32.0 10/15/2021 0726   RDW 13.5 10/15/2021 0726   RDW 14.1 06/04/2016 1030   LYMPHSABS 1.9 03/08/2020 0002   LYMPHSABS 3.3 (H) 06/04/2016 1030   MONOABS 0.7 03/08/2020 0002   EOSABS 0.1 03/08/2020 0002   EOSABS 0.1 06/04/2016 1030   BASOSABS 0.1 03/08/2020 0002   BASOSABS 0.0 06/04/2016 1030    ASSESSMENT AND PLAN: 1. Type 2 diabetes mellitus with morbid obesity (HCC) Uncontrolled.  Encouraged him to check blood sugars at least once a day and bring readings with him on next visit to see the clinical pharmacist. Increase glargine insulin to 48 units daily.  Continue Trulicity.  We brainstormed ways to help him remember to take the morning dose of metformin.  He will try setting an alarm on his phone for later in the morning to remind him to take the metformin. Discussed healthy eating habits.  Encouraged him to eliminate sugary drinks from the diet. Encouraged him to get moving if only for 10 minutes every day. - HgB A1c - Glucose (CBG) - Microalbumin / creatinine urine ratio - Dulaglutide (TRULICITY) 1.5 QK/8.6NO SOPN; Inject 1.5 mg into the skin once a week.  Dispense: 2 mL; Refill: 6 - Insulin Glargine (BASAGLAR KWIKPEN) 100 UNIT/ML; Inject 48 Units into the skin daily. Increase by 2 units every 3 days until CBGs <130. Max: 50 units.  Dispense: 15 mL; Refill: 6  2. Hyperlipidemia due to type 2 diabetes mellitus ([place]) Continue Pravachol.      Patient was given the opportunity to ask questions.  Patient verbalized understanding of the plan and was able to repeat key elements of the plan.   This documentation was completed using Radio producer.  Any transcriptional errors are unintentional.  Orders Placed This Encounter  Procedures   HgB A1c   Glucose (CBG)     Requested Prescriptions    No prescriptions requested or ordered in this encounter    No follow-ups on  file.  Karle Plumber, MD, FACP

## 2021-12-23 NOTE — Progress Notes (Signed)
F/u DM 

## 2021-12-24 LAB — MICROALBUMIN / CREATININE URINE RATIO
Creatinine, Urine: 287.6 mg/dL
Microalb/Creat Ratio: 35 mg/g creat — ABNORMAL HIGH (ref 0–29)
Microalbumin, Urine: 101.4 ug/mL

## 2022-01-23 ENCOUNTER — Telehealth (INDEPENDENT_AMBULATORY_CARE_PROVIDER_SITE_OTHER): Payer: No Payment, Other | Admitting: Psychiatry

## 2022-01-23 ENCOUNTER — Encounter (HOSPITAL_COMMUNITY): Payer: Self-pay | Admitting: Psychiatry

## 2022-01-23 ENCOUNTER — Other Ambulatory Visit: Payer: Self-pay

## 2022-01-23 DIAGNOSIS — F321 Major depressive disorder, single episode, moderate: Secondary | ICD-10-CM

## 2022-01-23 DIAGNOSIS — F900 Attention-deficit hyperactivity disorder, predominantly inattentive type: Secondary | ICD-10-CM | POA: Diagnosis not present

## 2022-01-23 MED ORDER — ATOMOXETINE HCL 80 MG PO CAPS
80.0000 mg | ORAL_CAPSULE | Freq: Every day | ORAL | 3 refills | Status: DC
  Filled 2022-01-23: qty 30, 30d supply, fill #0
  Filled 2022-03-26: qty 30, 30d supply, fill #1
  Filled 2022-06-24: qty 30, 30d supply, fill #2
  Filled 2022-11-03: qty 30, 30d supply, fill #3

## 2022-01-23 MED ORDER — SERTRALINE HCL 50 MG PO TABS
50.0000 mg | ORAL_TABLET | Freq: Every day | ORAL | 3 refills | Status: DC
  Filled 2022-01-23: qty 30, 30d supply, fill #0
  Filled 2022-03-26: qty 30, 30d supply, fill #1
  Filled 2022-06-24: qty 30, 30d supply, fill #2
  Filled 2022-11-03: qty 30, 30d supply, fill #3

## 2022-01-23 NOTE — Progress Notes (Signed)
[facility] MD/PA/NP OP Progress Note Virtual Visit via Video Note  I connected with [name] on 01/23/22 at 10:00 AM EDT by a video enabled telemedicine application and verified that I am speaking with the correct person using two identifiers.  Location: Patient: Home Provider: Clinic   I discussed the limitations of evaluation and management by telemedicine and the availability of in person appointments. The patient expressed understanding and agreed to proceed.  I provided 30 minutes of non-face-to-face time during this encounter.    01/23/2022 10:07 AM [name]  MRN:  [id number]  Chief Complaint: "I'm fine"  HPI: 41 year old male seen today for followup psychiatric evaluation. He has a psychiatric history of ADHD, anxiety and depression.  He is currently managed on Zoloft 50 mg daily and Strattera 80 mg daily.  He notes his medications are effective in managing his psychiatric conditions.   Today he is well-groomed, pleasant,cooperative, engaged in conversation, maintained eye contact.  He informed provider that overall he is doing well but notes that irritated with others. He informed Probation officer that he is able to cope with this.  Today provider conducted a GAD-7 and patient scored a 2.  Provider also conducted PHQ-9 and patient scored 8, at his last visit he scored a 7. Today he denies SI/HI/VAH, mania,or paranoia.  He endorses and appetite.  Patient informed writer that at times his sleep has been poor.  He notes that he often wakes up in the middle of the night and sleeps approximately 6 hours.  He however request that the medication not be adjusted or added.   No medication changes made today.  Patient will continue all medications as prescribed and follow-up with outpatient counseling for therapy.  No other concerns noted at this time.   Visit Diagnosis:    ICD-10-CM   1. Attention deficit hyperactivity disorder (ADHD), predominantly inattentive type  F90.0 atomoxetine  (STRATTERA) 80 MG capsule    2. Moderate major depression, single episode (HCC)  F32.1 sertraline (ZOLOFT) 50 MG tablet      Past Psychiatric History: anxiety and depression  Past Medical History:  Past Medical History:  Diagnosis Date   ADHD    Anxiety    Bipolar disorder ([place])    Depression    Diabetes mellitus without complication ([place])    type 2   Heart murmur    as a child   HLD (hyperlipidemia)    Hypertension    no meds   Peripheral vascular disease ([place])     Past Surgical History:  Procedure Laterality Date   AMPUTATION Left 03/06/2020   Procedure: LEFT GREAT TOE AMPUTATION;  Surgeon: Newt Minion, MD;  Location: [facility];  Service: Orthopedics;  Laterality: Left;   AMPUTATION Right 10/15/2021   Procedure: RIGHT GREAT TOE AMPUTATION;  Surgeon: Newt Minion, MD;  Location: [facility];  Service: Orthopedics;  Laterality: Right;   EYE SURGERY Bilateral    lasik   WISDOM TOOTH EXTRACTION      Family Psychiatric History: Mother depression, father depression, brother depression, maternal uncles alcohol dependence  Family History:  Family History  Problem Relation Age of Onset   Diabetes Mother    Hyperlipidemia Father    Hypertension Father     Social History:  Social History   Socioeconomic History   Marital status: Single    Spouse name: Not on file   Number of children: Not on file   Years of education: Not on file   Highest education level:  Not on file  Occupational History   Occupation: Unemployed  Tobacco Use   Smoking status: Never   Smokeless tobacco: Never  Vaping Use   Vaping Use: Never used  Substance and Sexual Activity   Alcohol use: Not Currently    Comment: last drink 2018   Drug use: Never   Sexual activity: Not on file  Other Topics Concern   Not on file  Social History Narrative   Live with parents in [place].    Social Determinants of Health   Financial Resource Strain: Not on file  Food Insecurity: Not on file   Transportation Needs: Not on file  Physical Activity: Not on file  Stress: Not on file  Social Connections: Not on file    Allergies:  Allergies  Allergen Reactions   Milk-Related Compounds Diarrhea    Metabolic Disorder Labs: Lab Results  Component Value Date   HGBA1C 11.2 (A) 12/23/2021   MPG 291.96 03/03/2020   No results found for: "PROLACTIN" Lab Results  Component Value Date   CHOL 183 06/05/2021   TRIG 252 (H) 06/05/2021   HDL 32 (L) 06/05/2021   CHOLHDL 5.7 (H) 06/05/2021   LDLCALC 107 (H) 06/05/2021   LDLCALC 122 (H) 10/28/2020   No results found for: "TSH"  Therapeutic Level Labs: No results found for: "LITHIUM" No results found for: "VALPROATE" No results found for: "CBMZ"  Current Medications: Current Outpatient Medications  Medication Sig Dispense Refill   atomoxetine (STRATTERA) 80 MG capsule Take 1 capsule (80 mg total) by mouth daily. 30 capsule 3   blood glucose meter kit and supplies Dispense based on patient and insurance preference. Use up to four times daily as directed. (FOR ICD-10 E10.9, E11.9). 1 each 0   Dulaglutide (TRULICITY) 1.5 BD/5.3GD SOPN Inject 1.5 mg into the skin once a week. 2 mL 6   Insulin Glargine (BASAGLAR KWIKPEN) 100 UNIT/ML Inject 48 Units into the skin daily. Increase by 2 units every 3 days until CBGs <130. Max: 50 units. 15 mL 6   Insulin Pen Needle (PEN NEEDLES) 31G X 8 MM MISC UAD 100 each 6   metFORMIN (GLUCOPHAGE) 500 MG tablet Take 2 tablets (1,000 mg total) by mouth 2 (two) times daily with a meal. 120 tablet 2   pravastatin (PRAVACHOL) 40 MG tablet Take 1 tablet (40 mg total) by mouth once daily. 30 tablet 2   sertraline (ZOLOFT) 50 MG tablet Take 1 tablet (50 mg total) by mouth daily. 30 tablet 3   No current facility-administered medications for this visit.     Musculoskeletal: Strength & Muscle Tone: within normal limits and  telehealth visit Gait & Station: normal, telehealth visit Patient leans:  N/A  Psychiatric Specialty Exam: Review of Systems  There were no vitals taken for this visit.There is no height or weight on file to calculate BMI.  General Appearance: Well Groomed  Eye Contact:  Good  Speech:  Clear and Coherent and Normal Rate  Volume:  Normal  Mood:  Euthymic  Affect:  Appropriate and Congruent  Thought Process:  Coherent, Goal Directed and Linear  Orientation:  Full (Time, Place, and Person)  Thought Content: WDL and Logical   Suicidal Thoughts:  No  Homicidal Thoughts:  No  Memory:  Immediate;   Good Recent;   Good Remote;   Good  Judgement:  Good  Insight:  Good  Psychomotor Activity:  Normal  Concentration:  Concentration: Good and Attention Span: Good  Recall:  Good  Fund of  Knowledge: Good  Language: Good  Akathisia:  No  Handed:  Right  AIMS (if indicated): Not done  Assets:  Communication Skills Desire for Improvement Financial Resources/Insurance Housing Leisure Time Physical Health Social Support  ADL's:  Intact  Cognition: WNL  Sleep:  Fair   Screenings: GAD-7    Flowsheet Row Video Visit from 01/23/2022 in [facility] Office Visit from 12/23/2021 in [facility] Video Visit from 10/28/2021 in [facility] Video Visit from 04/30/2021 in [facility] Office Visit from 01/30/2021 in [facility]  Total GAD-7 Score _0 PHQ2-9    Flowsheet Row Video Visit from 01/23/2022 in [facility] Office Visit from 12/23/2021 in [facility] Video Visit from 10/28/2021 in [facility] Counselor from 07/24/2021 in [facility] Video Visit from 04/30/2021 in [facility]  PHQ-2 Total Score _1 PHQ-9 Total Score _2 Flowsheet Row Video Visit from 10/28/2021  in [facility] Admission (Discharged) from 10/15/2021 in [facility] Counselor from 07/24/2021 in [facility] No Risk No Risk No Risk        Assessment and Plan: Patient informed writer that at times his sleep is poor however requested that his medications be changed.  He informed Probation officer that he is doing well on his current medication regimen.  No medication changes made today.  Patient will continue all medications as prescribed.  1. Attention deficit hyperactivity disorder (ADHD), predominantly inattentive type  Continue- atomoxetine (STRATTERA) 80 MG capsule; Take 1 capsule (80 mg total) by mouth daily.  Dispense: 30 capsule; Refill: 3  2. Moderate major depression, single episode (HCC)  Continue- sertraline (ZOLOFT) 50 MG tablet; Take 1 tablet (50 mg total) by mouth daily.  Dispense: 30 tablet; Refill: 3  Follow-up in 3 Follow-up therapy  Salley Slaughter, NP 01/23/2022, 10:07 AM

## 2022-01-27 ENCOUNTER — Other Ambulatory Visit: Payer: Self-pay

## 2022-01-29 ENCOUNTER — Other Ambulatory Visit: Payer: Self-pay

## 2022-01-29 ENCOUNTER — Ambulatory Visit: Payer: Self-pay | Attending: Internal Medicine | Admitting: Pharmacist

## 2022-01-29 DIAGNOSIS — E1169 Type 2 diabetes mellitus with other specified complication: Secondary | ICD-10-CM

## 2022-01-29 MED ORDER — METFORMIN HCL ER 500 MG PO TB24
2000.0000 mg | ORAL_TABLET | Freq: Every day | ORAL | 2 refills | Status: DC
  Filled 2022-01-29: qty 120, 30d supply, fill #0
  Filled 2022-03-03: qty 120, 30d supply, fill #1
  Filled 2022-03-26: qty 120, 30d supply, fill #2

## 2022-01-29 NOTE — Progress Notes (Signed)
    S:    PCP: Dr. Laural Benes   Patient arrives in good spirits. Presents for diabetes evaluation, education, and management. Patient was referred and last seen by Primary Care Provider on 12/23/2021.   At his last visit with his PCP, he admitted to only taking the metformin once daily. His Basaglar was increased to 48 u daily. Today, he tells me he is adherent with Basaglar and Trulicity but he still does not take metformin BID. He has no other complaints today.   Family/Social History:  -FHx: DM, HTN, HLD -Tobacco: never smoker -Alcohol: none  Insurance coverage/medication affordability: self pay  Medication adherence denied.  Current diabetes medications include: metformin 1000 mg BID (takes only once daily), Basaglar 48u daily, Trulicity 1.5 mg weekly  Patient denies hypoglycemic events.   Patient reported dietary habits: - Admits to dietary indiscretion, specifically, concerning sweets and sugars.  Patient-reported exercise habits: limited d/t foot and back pain.    Patient denies nocturia (nighttime urination).  Patient denies neuropathy (nerve pain). Patient denies visual changes. Patient reports self foot exams.     O:  Lab Results  Component Value Date   HGBA1C 11.2 (A) 12/23/2021   There were no vitals filed for this visit.  Lipid Panel     Component Value Date/Time   CHOL 183 06/05/2021 1114   TRIG 252 (H) 06/05/2021 1114   HDL 32 (L) 06/05/2021 1114   CHOLHDL 5.7 (H) 06/05/2021 1114   LDLCALC 107 (H) 06/05/2021 1114   No meter with him today. No CGM in place. Reported home blood sugars fasting and post-prandial: reports wide range of 150-250.  Clinical Atherosclerotic Cardiovascular Disease (ASCVD): No  The 10-year ASCVD risk score (Arnett DK, et al., 2019) is: 3.7%   Values used to calculate the score:     Age: 41 years     Sex: Male     Is Non-Hispanic African American: No     Diabetic: Yes     Tobacco smoker: No     Systolic Blood Pressure: 119  mmHg     Is BP treated: No     HDL Cholesterol: 32 mg/dL     Total Cholesterol: 183 mg/dL   A/P: Diabetes longstanding currently above goal. Patient is able to verbalize appropriate hypoglycemia management plan. Medication adherence appears suboptimal.  -Stop metformin regular release.  -Start metformin 500 mg XR. Take 4 tablets (2000 mg total) after dinner.  -Continue Basaglar and Trulicity at current dosing.  -Continue same dose of metformin BID.  -Extensively discussed pathophysiology of diabetes, recommended lifestyle interventions, dietary effects on blood sugar control -Counseled on s/sx of and management of hypoglycemia -Next A1c anticipated 03/2022.  Written patient instructions provided. Total time in face to face counseling 30 minutes. Follow up PCP Clinic Visit in 4 weeks.     Butch Penny, PharmD, P[place]y [place], CPP Clinical Pharmacist [facility] [phone]

## 2022-02-02 ENCOUNTER — Ambulatory Visit (INDEPENDENT_AMBULATORY_CARE_PROVIDER_SITE_OTHER): Payer: No Payment, Other | Admitting: Licensed Clinical Social Worker

## 2022-02-02 DIAGNOSIS — F900 Attention-deficit hyperactivity disorder, predominantly inattentive type: Secondary | ICD-10-CM

## 2022-02-02 NOTE — Progress Notes (Signed)
   THERAPIST PROGRESS NOTE  Session Time: 20 minutes  Participation Level: Active  Behavioral Response: CasualAlertEuthymic and Restless  Type of Therapy: Individual Therapy  Treatment Goals addressed: med compliance  ProgressTowards Goals: Met  Interventions: Supportive  Summary: [name] is a 41 y.o. male who presents for f/u with this cln. He arrives on time and maintains good eye contact throughout the session. He reports his mood has been stable, his sleep is consistent (6-8 hours per night), and that his medication compliance has improved. He reports he forgets to take his nighttime meds once or twice per month. He states he forgets his morning med more often and was able to work with his provider to change it to a nighttime med. He denies having anything in particular to talk about in therapy and states he is doing well. He agrees to f/u in two months and termination will be revisited at that time.   Suicidal/Homicidal: Nowithout intent/plan  Therapist Response: Cln assessed for current stressors, symptoms, and safety since last session. Cln inquired about medication compliance and discussed possibly terminating therapy, to which pt was open to. Cln and pt agree to f/u session to reassess at a later time. Cln scheduled follow-up appointment and confirmed pt's availability and preferred method of service delivery (virtual, per pt request).   Plan: Return again in 9 weeks.  Diagnosis: Attention deficit hyperactivity disorder (ADHD), predominantly inattentive type  Collaboration of Care: Other none required for this visit  Patient/Guardian was advised Release of Information must be obtained prior to any record release in order to collaborate their care with an outside provider. Patient/Guardian was advised if they have not already done so to contact the registration department to sign all necessary forms in order for [name] to release information regarding their care.    Consent: Patient/Guardian gives verbal consent for treatment and assignment of benefits for services provided during this visit. Patient/Guardian expressed understanding and agreed to proceed.   Heron Nay, LCSWA 02/02/2022

## 2022-02-06 ENCOUNTER — Other Ambulatory Visit: Payer: Self-pay

## 2022-03-03 ENCOUNTER — Ambulatory Visit: Payer: Self-pay | Attending: Internal Medicine | Admitting: Pharmacist

## 2022-03-03 ENCOUNTER — Other Ambulatory Visit: Payer: Self-pay

## 2022-03-03 DIAGNOSIS — E1169 Type 2 diabetes mellitus with other specified complication: Secondary | ICD-10-CM

## 2022-03-03 MED ORDER — DAPAGLIFLOZIN PROPANEDIOL 10 MG PO TABS
10.0000 mg | ORAL_TABLET | Freq: Every day | ORAL | 3 refills | Status: DC
  Filled 2022-03-03: qty 30, 30d supply, fill #0
  Filled 2022-03-26: qty 30, 30d supply, fill #1

## 2022-03-03 NOTE — Progress Notes (Signed)
    S:    PCP: Dr. Wynetta Emery   Patient arrives in good spirits. Presents for diabetes evaluation, education, and management. Patient was referred and last seen by Primary Care Provider on 12/23/2021. I saw him on 01/29/2022 and increased his XR metformin to 2000 mg daily.   Today, he reports doing well. He is adherent to metformin and denies any side effects since increasing his dose. He is adherent to his insulin and Trulicity.   Family/Social History:  -FHx: DM, HTN, HLD -Tobacco: never smoker -Alcohol: none  Insurance coverage/medication affordability: self pay  Medication adherence reported.  Current diabetes medications include: metformin 2000 mg daily (takes 4 tablets of 500mg  XR once daily), Basaglar 86V daily, Trulicity 1.5 mg weekly  Patient denies hypoglycemic events.   Patient reported dietary habits: - Admits to dietary indiscretion, specifically, concerning sweets and sugars. - Since his last visit, however, he has decreased his snacking and drinks 1-2 regular Pepsi's a week.  - Has increased his consumption of fruit and salad since last visit.  Patient-reported exercise habits: limited d/t foot and back pain.    Patient denies nocturia (nighttime urination).  Patient denies neuropathy (nerve pain). Patient denies visual changes. Patient reports self foot exams.     O:  Lab Results  Component Value Date   HGBA1C 11.2 (A) 12/23/2021   There were no vitals filed for this visit.  Lipid Panel     Component Value Date/Time   CHOL 183 06/05/2021 1114   TRIG 252 (H) 06/05/2021 1114   HDL 32 (L) 06/05/2021 1114   CHOLHDL 5.7 (H) 06/05/2021 1114   LDLCALC 107 (H) 06/05/2021 1114   No meter with him today. No CGM in place. Reported home blood sugars fasting and post-prandial: reports 170s - 250s.   Clinical Atherosclerotic Cardiovascular Disease (ASCVD): No  The 10-year ASCVD risk score (Arnett DK, et al., 2019) is: 3.7%   Values used to calculate the score:      Age: 41 years     Sex: Male     Is Non-Hispanic African American: No     Diabetic: Yes     Tobacco smoker: No     Systolic Blood Pressure: 784 mmHg     Is BP treated: No     HDL Cholesterol: 32 mg/dL     Total Cholesterol: 183 mg/dL   A/P: Diabetes longstanding currently above goal. Patient is able to verbalize appropriate hypoglycemia management plan. Medication adherence appears optimal.  -Continue metformin 500 mg XR. Take 4 tablets (2000 mg total) after dinner.  -Continue Basaglar 48u daily.  -Continue Trulicity at current dosing. We do not have PASS available for doses >1.5 mg.  -Start Farxiga 10 mg daily.  -Extensively discussed pathophysiology of diabetes, recommended lifestyle interventions, dietary effects on blood sugar control -Counseled on s/sx of and management of hypoglycemia -Next A1c anticipated 03/2022.  Written patient instructions provided. Total time in face to face counseling 30 minutes. Follow up Clinic Visit in 4 weeks. PCP in December.   Benard Halsted, PharmD, [place], [name] [phone]

## 2022-03-11 ENCOUNTER — Other Ambulatory Visit: Payer: Self-pay

## 2022-03-17 ENCOUNTER — Other Ambulatory Visit: Payer: Self-pay

## 2022-03-26 ENCOUNTER — Other Ambulatory Visit: Payer: Self-pay

## 2022-03-26 ENCOUNTER — Other Ambulatory Visit: Payer: Self-pay | Admitting: Internal Medicine

## 2022-03-26 DIAGNOSIS — E1169 Type 2 diabetes mellitus with other specified complication: Secondary | ICD-10-CM

## 2022-03-26 MED ORDER — PRAVASTATIN SODIUM 40 MG PO TABS
40.0000 mg | ORAL_TABLET | Freq: Every day | ORAL | 2 refills | Status: DC
  Filled 2022-03-26: qty 30, 30d supply, fill #0
  Filled 2022-06-24: qty 30, 30d supply, fill #1
  Filled 2022-11-03: qty 30, 30d supply, fill #2

## 2022-03-26 NOTE — Telephone Encounter (Signed)
Requested Prescriptions  Pending Prescriptions Disp Refills   pravastatin (PRAVACHOL) 40 MG tablet 30 tablet 2    Sig: Take 1 tablet (40 mg total) by mouth once daily.     Cardiovascular:  Antilipid - Statins Failed - 03/26/2022  2:44 PM      Failed - Lipid Panel in normal range within the last 12 months    Cholesterol, Total  Date Value Ref Range Status  06/05/2021 183 100 - 199 mg/dL Final   LDL Chol Calc (NIH)  Date Value Ref Range Status  06/05/2021 107 (H) 0 - 99 mg/dL Final   HDL  Date Value Ref Range Status  06/05/2021 32 (L) >39 mg/dL Final   Triglycerides  Date Value Ref Range Status  06/05/2021 252 (H) 0 - 149 mg/dL Final         Passed - Patient is not pregnant      Passed - Valid encounter within last 12 months    Recent Outpatient Visits           3 weeks ago Type 2 diabetes mellitus with morbid obesity ([place])   [place], Stephen L, RPH-CPP   1 month ago Type 2 diabetes mellitus with morbid obesity [facility])   [place], Annie Main L, RPH-CPP   3 months ago Type 2 diabetes mellitus with morbid obesity ([place])   [place] Karle Plumber B, MD   7 months ago Uncontrolled type 2 diabetes mellitus with hyperglycemia [facility])   [place], Annie Main L, RPH-CPP   8 months ago Type 2 diabetes mellitus with morbid obesity [facility])   [place], RPH-CPP       Future Appointments             In 2 weeks Daisy Blossom, Jarome Matin, [place]   In 1 month Wynetta Emery, Dalbert Batman, MD [place]

## 2022-03-30 ENCOUNTER — Other Ambulatory Visit: Payer: Self-pay

## 2022-04-06 ENCOUNTER — Encounter (HOSPITAL_COMMUNITY): Payer: Self-pay

## 2022-04-06 ENCOUNTER — Ambulatory Visit (HOSPITAL_COMMUNITY): Payer: No Payment, Other | Admitting: Licensed Clinical Social Worker

## 2022-04-06 DIAGNOSIS — F900 Attention-deficit hyperactivity disorder, predominantly inattentive type: Secondary | ICD-10-CM

## 2022-04-06 NOTE — Progress Notes (Addendum)
Cln signed on at 11:02 am, sent text link for video session per schedule, and remained online for session until 11:15 am. Pt failed to sign on for session.   

## 2022-04-08 NOTE — Progress Notes (Deleted)
S:     PCP: [name]  41 y.o. male who presents for diabetes evaluation, education, and management.  PMH is significant for ADHD, obesity, T2DM, HTN, s/p left great toe amputation, HTN, PAD.  Patient was referred and last seen by Primary Care Provider, Dr. Laural Benes, on 12/23/2021.   At last visit, he reported adherence to his change in administration of metformin to once daily at dinner.   Today, patient arrives in *** good spirits and presents without *** any assistance. ***  Patient reports Diabetes was diagnosed in ***.   Family/Social History:  -FHx: DM, HTN, HLD -Tobacco: never smoker -Alcohol: none   Insurance coverage/medication affordability: self pay  Current diabetes medications include: metformin 2000 mg daily (takes 4 tablets of 500mg  XR once daily), Basaglar 48u daily, Trulicity 1.5 mg weekly, Farxiga 10mg  once daily Current hypertension medications include: none Current hyperlipidemia medications include: pravastatin 40mg  once daily  Patient reports adherence to taking all medications as prescribed.  *** Patient denies adherence with medications, reports missing *** medications *** times per week, on average.  Patient {Actions; denies-reports:120008} hypoglycemic events.  Reported home fasting blood sugars: ***  Reported 2 hour post-meal/random blood sugars: ***.  Patient {Actions; denies-reports:120008} nocturia (nighttime urination).  Patient {Actions; denies-reports:120008} neuropathy (nerve pain). Patient {Actions; denies-reports:120008} visual changes. Patient {Actions; denies-reports:120008} self foot exams.   Patient reported dietary habits: Eats *** meals/day Breakfast: *** Lunch: *** Dinner: *** Snacks: *** Drinks: ***  Within the past 12 months, did you worry whether your food would run out before you got money to buy more? {YES NO:22349} Within the past 12 months, did the food you bought run out, and you didn't have money to get more?  {YES NO:22349} PHQ-9 Score: ***  Patient-reported exercise habits: ***   O:   ROS  Physical Exam  7 day average blood glucose: ***  *** CGM Download:  % Time CGM is active: ***% Average Glucose: *** mg/dL Glucose Management Indicator: ***  Glucose Variability: *** (goal <36%) Time in Goal:  - Time in range 70-180: ***% - Time above range: ***% - Time below range: ***% Observed patterns:   Lab Results  Component Value Date   HGBA1C 11.2 (A) 12/23/2021   There were no vitals filed for this visit.  Lipid Panel     Component Value Date/Time   CHOL 183 06/05/2021 1114   TRIG 252 (H) 06/05/2021 1114   HDL 32 (L) 06/05/2021 1114   CHOLHDL 5.7 (H) 06/05/2021 1114   LDLCALC 107 (H) 06/05/2021 1114    Clinical Atherosclerotic Cardiovascular Disease (ASCVD): {YES/NO:21197} The 10-year ASCVD risk score (Arnett DK, et al., 2019) is: 3.7%   Values used to calculate the score:     Age: 23 years     Sex: Male     Is Non-Hispanic African American: No     Diabetic: Yes     Tobacco smoker: No     Systolic Blood Pressure: 119 mmHg     Is BP treated: No     HDL Cholesterol: 32 mg/dL     Total Cholesterol: 183 mg/dL   Patient is participating in a Managed Medicaid Plan:  {MM YES/NO:27447::"Yes"}   A/P: Diabetes longstanding *** currently ***. Patient is *** able to verbalize appropriate hypoglycemia management plan. Medication adherence appears ***. Control is suboptimal due to ***. -{Meds adjust:18428} basal insulin *** (insulin ***). Patient will continue to titrate 1 unit every *** days if fasting blood sugar > 100mg /dl until fasting  blood sugars reach goal or next visit.  -{Meds adjust:18428} rapid insulin *** (insulin ***) to ***.  -{Meds adjust:18428} GLP-1 *** (generic ***) to ***.  -{Meds adjust:18428} SGLT2-I *** (generic ***) to ***. Counseled on sick day rules. -{Meds adjust:18428} metformin *** to ***.  -Patient educated on purpose, proper use, and potential  adverse effects of ***.  -Extensively discussed pathophysiology of diabetes, recommended lifestyle interventions, dietary effects on blood sugar control.  -Counseled on s/sx of and management of hypoglycemia.   ASCVD risk - primary ***secondary prevention in patient with diabetes. Last LDL is *** not at goal of <99 *** mg/dL. ASCVD risk factors include *** and 10-year ASCVD risk score of ***. {Desc; low/moderate/high:110033} intensity statin indicated.  -{Meds adjust:18428} ***statin *** mg.    Written patient instructions provided. Patient verbalized understanding of treatment plan.  Total time in face to face counseling *** minutes.    Follow-up:  Pharmacist ***. PCP clinic visit in ***.  Patient seen with ***.

## 2022-04-09 ENCOUNTER — Ambulatory Visit: Payer: Self-pay | Admitting: Pharmacist

## 2022-04-09 ENCOUNTER — Encounter (HOSPITAL_COMMUNITY): Payer: Self-pay

## 2022-04-17 ENCOUNTER — Telehealth (HOSPITAL_COMMUNITY): Payer: No Payment, Other | Admitting: Psychiatry

## 2022-04-28 ENCOUNTER — Encounter: Payer: Self-pay | Admitting: Internal Medicine

## 2022-04-28 ENCOUNTER — Other Ambulatory Visit: Payer: Self-pay

## 2022-04-28 ENCOUNTER — Ambulatory Visit: Payer: Self-pay | Attending: Internal Medicine | Admitting: Internal Medicine

## 2022-04-28 DIAGNOSIS — E1169 Type 2 diabetes mellitus with other specified complication: Secondary | ICD-10-CM

## 2022-04-28 DIAGNOSIS — Z2821 Immunization not carried out because of patient refusal: Secondary | ICD-10-CM

## 2022-04-28 DIAGNOSIS — E08621 Diabetes mellitus due to underlying condition with foot ulcer: Secondary | ICD-10-CM

## 2022-04-28 DIAGNOSIS — L97522 Non-pressure chronic ulcer of other part of left foot with fat layer exposed: Secondary | ICD-10-CM

## 2022-04-28 DIAGNOSIS — E785 Hyperlipidemia, unspecified: Secondary | ICD-10-CM

## 2022-04-28 DIAGNOSIS — F321 Major depressive disorder, single episode, moderate: Secondary | ICD-10-CM

## 2022-04-28 LAB — POCT GLYCOSYLATED HEMOGLOBIN (HGB A1C): HbA1c, POC (controlled diabetic range): 11.7 % — AB (ref 0.0–7.0)

## 2022-04-28 LAB — GLUCOSE, POCT (MANUAL RESULT ENTRY): POC Glucose: 294 mg/dl — AB (ref 70–99)

## 2022-04-28 MED ORDER — DAPAGLIFLOZIN PROPANEDIOL 10 MG PO TABS
ORAL_TABLET | ORAL | 3 refills | Status: DC
  Filled 2022-04-28: qty 30, fill #0

## 2022-04-28 NOTE — Progress Notes (Unsigned)
Patient ID: [name], male    DOB: [birth year]  MRN: [id number]  CC: Diabetes (DM f/u. /No to flu vax.)   Subjective: [name] is a 41 y.o. male who presents for chronic ds management His concerns today include:  Pt with hx of DM type 2, HL, HTN (off meds x 15 yrs) obesity, DM foot ulcers, amputation LT big toe (02/2020), MDD, obesity, RT big toe amputation 2023  DM: Results for orders placed or performed in visit on 04/28/22  POCT glucose (manual entry)  Result Value Ref Range   POC Glucose 294 (A) 70 - 99 mg/dl  POCT glycosylated hemoglobin (Hb A1C)  Result Value Ref Range   Hemoglobin A1C     HbA1c POC (<> result, manual entry)     HbA1c, POC (prediabetic range)     HbA1c, POC (controlled diabetic range) 11.7 (A) 0.0 - 7.0 %  He was off medsx 6 wks.  Just got back on them 1 wk ago Reports he was going through a lot with his depression and anxiety.  Had stopped taking his psychiatric meds for a while as well. He has not been checking BS Trulicity helps in dec appetite which means for him less snacking.  Patient Active Problem List   Diagnosis Date Noted   MDD (major depressive disorder), recurrent, in partial remission ([place]) 11/19/2021   Osteomyelitis of great toe of right foot ([place])    Bipolar depression ([place]) 09/10/2021   Adjustment disorder with mixed anxiety and depressed mood 09/10/2021   Attention deficit hyperactivity disorder (ADHD), predominantly inattentive type 07/30/2020   Anxiety with depression 06/07/2020   Hyperlipidemia due to type 2 diabetes mellitus ([place]) 03/27/2020   Status post amputation of left great toe ([place]) 03/26/2020   Influenza vaccination declined 03/26/2020   23-polyvalent pneumococcal polysaccharide vaccine declined 03/26/2020   COVID-19 vaccine series completed 03/26/2020   Moderate major depression, single episode ([place]) 03/26/2020   Anaerobic bacteremia 03/07/2020   Major depressive disorder, recurrent episode, moderate  ([place]) 03/04/2020   Abscess of great toe, left    Sepsis due to group B Streptococcus with acute renal failure (HCC)    Cellulitis of left toe 03/03/2020   Diabetic ketoacidosis ([place]) 03/03/2020   DKA (diabetic ketoacidosis) ([place]) 03/03/2020   Ulcer of right foot with fat layer exposed ([place]) 06/06/2019   Non-pressure chronic ulcer of other part of left foot limited to breakdown of skin ([place]) 06/06/2019   Uncontrolled type 2 diabetes mellitus with hyperglycemia ([place]) 05/02/2018   Diabetic ulcer of left great toe ([place]) 07/30/2017   Venous insufficiency of both lower extremities 07/30/2017   Obesity, Class III, BMI 40-49.9 (morbid obesity) ([place]) 07/30/2017   Essential hypertension 09/03/2016     Current Outpatient Medications on File Prior to Visit  Medication Sig Dispense Refill   atomoxetine (STRATTERA) 80 MG capsule Take 1 capsule (80 mg total) by mouth daily. 30 capsule 3   blood glucose meter kit and supplies Dispense based on patient and insurance preference. Use up to four times daily as directed. (FOR ICD-10 E10.9, E11.9). 1 each 0   dapagliflozin propanediol (FARXIGA) 10 MG TABS tablet Take 1 tablet (10 mg total) by mouth daily before breakfast. 30 tablet 3   Dulaglutide (TRULICITY) 1.5 WC/3.7SE SOPN Inject 1.5 mg into the skin once a week. 2 mL 6   Insulin Glargine (BASAGLAR KWIKPEN) 100 UNIT/ML Inject 48 Units into the skin daily. Increase by 2 units every 3 days until CBGs <130. Max:  50 units. 15 mL 6   Insulin Pen Needle (PEN NEEDLES) 31G X 8 MM MISC UAD 100 each 6   metFORMIN (GLUCOPHAGE-XR) 500 MG 24 hr tablet Take 4 tablets (2,000 mg total) by mouth daily with breakfast. 120 tablet 2   pravastatin (PRAVACHOL) 40 MG tablet Take 1 tablet (40 mg total) by mouth once daily. 30 tablet 2   sertraline (ZOLOFT) 50 MG tablet Take 1 tablet (50 mg total) by mouth daily. 30 tablet 3   No current facility-administered medications on file prior to visit.    Allergies  Allergen Reactions    Milk-Related Compounds Diarrhea    Social History   Socioeconomic History   Marital status: Single    Spouse name: Not on file   Number of children: Not on file   Years of education: Not on file   Highest education level: Not on file  Occupational History   Occupation: Unemployed  Tobacco Use   Smoking status: Never   Smokeless tobacco: Never  Vaping Use   Vaping Use: Never used  Substance and Sexual Activity   Alcohol use: Not Currently    Comment: last drink 2018   Drug use: Never   Sexual activity: Not on file  Other Topics Concern   Not on file  Social History Narrative   Live with parents in [place].    Social Determinants of Health   Financial Resource Strain: Not on file  Food Insecurity: Not on file  Transportation Needs: Not on file  Physical Activity: Not on file  Stress: Not on file  Social Connections: Not on file  Intimate Partner Violence: Not on file    Family History  Problem Relation Age of Onset   Diabetes Mother    Hyperlipidemia Father    Hypertension Father     Past Surgical History:  Procedure Laterality Date   AMPUTATION Left 03/06/2020   Procedure: LEFT GREAT TOE AMPUTATION;  Surgeon: Newt Minion, MD;  Location: [facility];  Service: Orthopedics;  Laterality: Left;   AMPUTATION Right 10/15/2021   Procedure: RIGHT GREAT TOE AMPUTATION;  Surgeon: Newt Minion, MD;  Location: [facility];  Service: Orthopedics;  Laterality: Right;   EYE SURGERY Bilateral    lasik   WISDOM TOOTH EXTRACTION      ROS: Review of Systems Negative except as stated above  PHYSICAL EXAM: BP 129/84 (BP Location: Left Arm, Patient Position: Sitting, Cuff Size: Normal)   Pulse 85   Temp 97.6 F (36.4 C) (Oral)   Ht _0  (1.854 m)   Wt (!) 331 lb (150.1 kg)   SpO2 100%   BMI 43.67 kg/m   Physical Exam  {male adult master:310786} {male adult master:310785}     Latest Ref Rng & Units 10/15/2021    7:26 AM 06/05/2021   11:14 AM 10/28/2020   10:32 AM   CMP  Glucose 70 - 99 mg/dL 363  510  262   BUN 6 - 20 mg/dL _1 Creatinine 0.61 - 1.24 mg/dL 0.96  0.83  0.85   Sodium 135 - 145 mmol/L 135  136  138   Potassium 3.5 - 5.1 mmol/L 4.2  4.4  4.4   Chloride 98 - 111 mmol/L 102  96  99   CO2 22 - 32 mmol/L _2 Calcium 8.9 - 10.3 mg/dL 8.8  9.3  9.6   Total Protein 6.0 - 8.5 g/dL  8.2  7.9  Total Bilirubin 0.0 - 1.2 mg/dL  0.3  0.3   Alkaline Phos 44 - 121 IU/L  131  102   AST 0 - 40 IU/L  17  14   ALT 0 - 44 IU/L  19  17    Lipid Panel     Component Value Date/Time   CHOL 183 06/05/2021 1114   TRIG 252 (H) 06/05/2021 1114   HDL 32 (L) 06/05/2021 1114   CHOLHDL 5.7 (H) 06/05/2021 1114   LDLCALC 107 (H) 06/05/2021 1114    CBC    Component Value Date/Time   WBC 10.0 10/15/2021 0726   RBC 5.57 10/15/2021 0726   HGB 15.0 10/15/2021 0726   HGB 15.0 06/04/2016 1030   HCT 46.9 10/15/2021 0726   HCT 45.8 06/04/2016 1030   PLT 275 10/15/2021 0726   PLT 254 06/04/2016 1030   MCV 84.2 10/15/2021 0726   MCV 82.6 05/02/2018 1521   MCV 83 06/04/2016 1030   MCH 26.9 10/15/2021 0726   MCHC 32.0 10/15/2021 0726   RDW 13.5 10/15/2021 0726   RDW 14.1 06/04/2016 1030   LYMPHSABS 1.9 03/08/2020 0002   LYMPHSABS 3.3 (H) 06/04/2016 1030   MONOABS 0.7 03/08/2020 0002   EOSABS 0.1 03/08/2020 0002   EOSABS 0.1 06/04/2016 1030   BASOSABS 0.1 03/08/2020 0002   BASOSABS 0.0 06/04/2016 1030    ASSESSMENT AND PLAN:  1. Type 2 diabetes mellitus with morbid obesity (HCC) *** - POCT glucose (manual entry) - POCT glycosylated hemoglobin (Hb A1C)    Patient was given the opportunity to ask questions.  Patient verbalized understanding of the plan and was able to repeat key elements of the plan.   This documentation was completed using Radio producer.  Any transcriptional errors are unintentional.  Orders Placed This Encounter  Procedures   POCT glucose (manual entry)   POCT glycosylated hemoglobin  (Hb A1C)     Requested Prescriptions    No prescriptions requested or ordered in this encounter    No follow-ups on file.  Karle Plumber, MD, FACP

## 2022-04-28 NOTE — Patient Instructions (Signed)
Change Farxiga to one tablet every Monday/Wednesday and Friday.

## 2022-04-29 LAB — COMPREHENSIVE METABOLIC PANEL
ALT: 21 IU/L (ref 0–44)
AST: 19 IU/L (ref 0–40)
Albumin/Globulin Ratio: 1.4 (ref 1.2–2.2)
Albumin: 4.8 g/dL (ref 4.1–5.1)
Alkaline Phosphatase: 120 IU/L (ref 44–121)
BUN/Creatinine Ratio: 11 (ref 9–20)
BUN: 11 mg/dL (ref 6–24)
Bilirubin Total: 0.5 mg/dL (ref 0.0–1.2)
CO2: 23 mmol/L (ref 20–29)
Calcium: 10 mg/dL (ref 8.7–10.2)
Chloride: 99 mmol/L (ref 96–106)
Creatinine, Ser: 1.04 mg/dL (ref 0.76–1.27)
Globulin, Total: 3.4 g/dL (ref 1.5–4.5)
Glucose: 293 mg/dL — ABNORMAL HIGH (ref 70–99)
Potassium: 4.5 mmol/L (ref 3.5–5.2)
Sodium: 139 mmol/L (ref 134–144)
Total Protein: 8.2 g/dL (ref 6.0–8.5)
eGFR: 91 mL/min/{1.73_m2} (ref >59)

## 2022-04-30 ENCOUNTER — Encounter: Payer: Self-pay | Admitting: Orthopedic Surgery

## 2022-04-30 ENCOUNTER — Ambulatory Visit (INDEPENDENT_AMBULATORY_CARE_PROVIDER_SITE_OTHER): Payer: Self-pay | Admitting: Orthopedic Surgery

## 2022-04-30 DIAGNOSIS — S98111A Complete traumatic amputation of right great toe, initial encounter: Secondary | ICD-10-CM

## 2022-04-30 DIAGNOSIS — M6702 Short Achilles tendon (acquired), left ankle: Secondary | ICD-10-CM

## 2022-04-30 DIAGNOSIS — L97521 Non-pressure chronic ulcer of other part of left foot limited to breakdown of skin: Secondary | ICD-10-CM

## 2022-04-30 DIAGNOSIS — Z89411 Acquired absence of right great toe: Secondary | ICD-10-CM

## 2022-04-30 NOTE — Progress Notes (Signed)
Office Visit Note   Patient: [name]           Date of Birth: [birth year]           MRN: [id number] Visit Date: 04/30/2022              Requested by: Marcine Matar, MD [address] [place] [address] [place],  [place] [address] PCP: Marcine Matar, MD  Chief Complaint  Patient presents with   Left Foot - Wound Check      HPI: Patient is a 41--year-old male who presents with ulceration beneath the forefoot.  Patient is status post bilateral great toe amputations.  Assessment & Plan: Visit Diagnoses:  1. Amputated great toe, right (HCC)   2. Achilles tendon contracture, left   3. Ulcer of left foot, limited to breakdown of skin (HCC)     Plan: Patient was demonstrated and given instructions for Achilles stretching.  Reevaluate in 4 weeks.  Discussed that if were not showing any improvement with Achilles stretching we could proceed with a gastrocnemius recession.  Follow-Up Instructions: Return in about 4 weeks (around 05/28/2022).   Ortho Exam  Patient is alert, oriented, no adenopathy, well-dressed, normal affect, normal respiratory effort. Examination patient has a strong dorsalis pedis pulse he has dorsiflexion of the ankle 20 degrees short of neutral with the knee extended.  He has a Wagner grade 1 ulcer beneath the first metatarsal head and 3 ulcers beneath the lesser metatarsal heads.  After informed consent a 10 blade knife is used to debride the skin and soft tissue back to healthy viable bleeding granulation tissue beneath the great toe MTP joint.  The ulcer is 10 mm in diameter 1 mm deep after debridement.  There is no undermining no exposed bone or tendon no signs of deep infection.  Imaging: No results found. No images are attached to the encounter.  Labs: Lab Results  Component Value Date   HGBA1C 11.7 (A) 04/28/2022   HGBA1C 11.2 (A) 12/23/2021   HGBA1C 11.1 (H) 06/05/2021   ESRSEDRATE 48 (H) 03/21/2020   ESRSEDRATE 81 (H) 03/05/2020   CRP 5.6  03/21/2020   CRP 9.0 (H) 03/05/2020   LABURIC 4.6 06/26/2021   LABURIC 5.1 02/18/2021   REPTSTATUS 03/09/2020 FINAL 03/04/2020   CULT  03/04/2020    NO GROWTH 5 DAYS Performed at [facility], [address] [place], [place] [address]    LABORGA GROUP B STREP(S.AGALACTIAE)ISOLATED 03/02/2020     Lab Results  Component Value Date   ALBUMIN 4.8 04/28/2022   ALBUMIN 4.6 06/05/2021   ALBUMIN 4.6 10/28/2020    No results found for: "MG" No results found for: "VD25OH"  No results found for: "PREALBUMIN"    Latest Ref Rng & Units 10/15/2021    7:26 AM 08/09/2020    6:26 AM 03/08/2020   12:02 AM  CBC EXTENDED  WBC 4.0 - 10.5 K/uL 10.0  14.8  6.8   RBC 4.22 - 5.81 MIL/uL 5.57  5.59  4.71   Hemoglobin 13.0 - 17.0 g/dL 19.4  17.4  08.1   HCT 39.0 - 52.0 % 46.9  46.7  39.7   Platelets 150 - 400 K/uL 275  275  349   NEUT# 1.7 - 7.7 K/uL   3.9   Lymph# 0.7 - 4.0 K/uL   1.9      There is no height or weight on file to calculate BMI.  Orders:  No orders of the defined  types were placed in this encounter.  No orders of the defined types were placed in this encounter.    Procedures: No procedures performed  Clinical Data: No additional findings.  ROS:  All other systems negative, except as noted in the HPI. Review of Systems  Objective: Vital Signs: There were no vitals taken for this visit.  Specialty Comments:  No specialty comments available.  PMFS History: Patient Active Problem List   Diagnosis Date Noted   MDD (major depressive disorder), recurrent, in partial remission (HCC) 11/19/2021   Osteomyelitis of great toe of right foot (HCC)    Bipolar depression (HCC) 09/10/2021   Adjustment disorder with mixed anxiety and depressed mood 09/10/2021   Attention deficit hyperactivity disorder (ADHD), predominantly inattentive type 07/30/2020   Anxiety with depression 06/07/2020   Hyperlipidemia due to type 2 diabetes mellitus (HCC) 03/27/2020   Status post  amputation of left great toe (HCC) 03/26/2020   Influenza vaccination declined 03/26/2020   23-polyvalent pneumococcal polysaccharide vaccine declined 03/26/2020   COVID-19 vaccine series completed 03/26/2020   Moderate major depression, single episode (HCC) 03/26/2020   Anaerobic bacteremia 03/07/2020   Major depressive disorder, recurrent episode, moderate (HCC) 03/04/2020   Abscess of great toe, left    Sepsis due to group B Streptococcus with acute renal failure (HCC)    Cellulitis of left toe 03/03/2020   Diabetic ketoacidosis (HCC) 03/03/2020   DKA (diabetic ketoacidosis) (HCC) 03/03/2020   Ulcer of right foot with fat layer exposed (HCC) 06/06/2019   Non-pressure chronic ulcer of other part of left foot limited to breakdown of skin (HCC) 06/06/2019   Uncontrolled type 2 diabetes mellitus with hyperglycemia (HCC) 05/02/2018   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) 07/30/2017   Venous insufficiency of both lower extremities 07/30/2017   Obesity, Class III, BMI 40-49.9 (morbid obesity) (HCC) 07/30/2017   Essential hypertension 09/03/2016   Past Medical History:  Diagnosis Date   ADHD    Anxiety    Bipolar disorder (HCC)    Depression    Diabetes mellitus without complication (HCC)    type 2   Heart murmur    as a child   HLD (hyperlipidemia)    Hypertension    no meds   Peripheral vascular disease (HCC)     Family History  Problem Relation Age of Onset   Diabetes Mother    Hyperlipidemia Father    Hypertension Father     Past Surgical History:  Procedure Laterality Date   AMPUTATION Left 03/06/2020   Procedure: LEFT GREAT TOE AMPUTATION;  Surgeon: [name], MD;  Location[facility];  Service: Orthopedics;  Laterality: Left;   AMPUTATION Right 10/15/2021   Procedure: RIGHT GREAT TOE AMPUTATION;  Surgeon: [name], MD;  Location: [facility] OR;  Service: Orthopedics;  Laterality: Right;   EYE SURGERY Bilateral    lasik   WISDOM TOOTH EXTRACTION     Social History    Occupational History   Occupation: Unemployed  Tobacco Use   Smoking status: Never   Smokeless tobacco: Never  Vaping Use   Vaping Use: Never used  Substance and Sexual Activity   Alcohol use: Not Currently    Comment: last drink 2018   Drug use: Never   Sexual activity: Not on file

## 2022-05-13 ENCOUNTER — Other Ambulatory Visit: Payer: Self-pay

## 2022-05-13 ENCOUNTER — Encounter: Payer: Self-pay | Attending: Internal Medicine | Admitting: Internal Medicine

## 2022-05-13 ENCOUNTER — Other Ambulatory Visit (HOSPITAL_COMMUNITY): Payer: Self-pay

## 2022-05-13 DIAGNOSIS — L97522 Non-pressure chronic ulcer of other part of left foot with fat layer exposed: Secondary | ICD-10-CM | POA: Insufficient documentation

## 2022-05-13 DIAGNOSIS — Z89412 Acquired absence of left great toe: Secondary | ICD-10-CM | POA: Insufficient documentation

## 2022-05-13 DIAGNOSIS — E11621 Type 2 diabetes mellitus with foot ulcer: Secondary | ICD-10-CM | POA: Insufficient documentation

## 2022-05-13 DIAGNOSIS — Z794 Long term (current) use of insulin: Secondary | ICD-10-CM | POA: Insufficient documentation

## 2022-05-13 DIAGNOSIS — Z8719 Personal history of other diseases of the digestive system: Secondary | ICD-10-CM | POA: Insufficient documentation

## 2022-05-13 DIAGNOSIS — Z89411 Acquired absence of right great toe: Secondary | ICD-10-CM | POA: Insufficient documentation

## 2022-05-13 DIAGNOSIS — I872 Venous insufficiency (chronic) (peripheral): Secondary | ICD-10-CM | POA: Insufficient documentation

## 2022-05-13 DIAGNOSIS — E1140 Type 2 diabetes mellitus with diabetic neuropathy, unspecified: Secondary | ICD-10-CM | POA: Insufficient documentation

## 2022-05-13 DIAGNOSIS — L97512 Non-pressure chronic ulcer of other part of right foot with fat layer exposed: Secondary | ICD-10-CM | POA: Insufficient documentation

## 2022-05-13 MED ORDER — AMOXICILLIN-POT CLAVULANATE 875-125 MG PO TABS
1.0000 | ORAL_TABLET | Freq: Two times a day (BID) | ORAL | 0 refills | Status: DC
  Filled 2022-05-13: qty 20, 10d supply, fill #0

## 2022-05-13 MED ORDER — DOXYCYCLINE HYCLATE 100 MG PO TABS
100.0000 mg | ORAL_TABLET | Freq: Two times a day (BID) | ORAL | 0 refills | Status: DC
  Filled 2022-05-13: qty 20, 10d supply, fill #0

## 2022-05-14 NOTE — Progress Notes (Signed)
[name], [name] ([id number]) [id number] Page 1 of 17 Visit Report for 05/13/2022 Allergy List Details Patient Name: Date of Service: [name], [name] 05/13/2022 1:00 PM Medical Record Number: [id number] Patient Account Number: [id number] Date of Birth/Sex: Treating RN: November 11, 1980 (41 y.o. Seward Meth Primary Care Fortunato Nordin: Pearson Grippe RA H Other Clinician: Referring [name]: Treating [name]/Extender: Jena Gauss HNSO N, DEBO RA H Weeks in Treatment: 0 Allergies Active Allergies lactose Severity: Moderate Type: Food Allergy Notes Electronic Signature(s) Signed: 05/13/2022 4:55:38 PM By: Rosalio Loud MSN RN CNS WTA Entered By: Rosalio Loud on 05/13/2022 16:55:38 -------------------------------------------------------------------------------- Arrival Information Details Patient Name: Date of Service: [name] 05/13/2022 1:00 PM Medical Record Number: [id number] Patient Account Number: [id number] Date of Birth/Sex: Treating RN: [birth year] (41 y.o. Seward Meth Primary Care Kathleen Likins: Pearson Grippe RA H Other Clinician: Referring Latonda Larrivee: Treating Charmayne Odell/Extender: Haywood Pao, DEBO RA H Weeks in Treatment: 0 Visit Information Patient Arrived: Ambulatory Arrival Time: 13:12 Accompanied By: self Transfer Assistance: None Patient Identification Verified: Yes Secondary Verification Process Completed: Yes Patient Requires Transmission-Based Precautions: No Patient Has Alerts: Yes Patient Alerts: Diabetic Type 2 Electronic Signature(s) KEYGAN, DUMOND ([id number]) [phone].pdf Page 2 of 17 Signed: 05/13/2022 4:57:18 PM By: Rosalio Loud MSN RN CNS WTA Entered By: Rosalio Loud on 05/13/2022 13:16:08 -------------------------------------------------------------------------------- Clinic Level of Care Assessment Details Patient Name: Date of Service: [name], [name] 05/13/2022 1:00 PM Medical Record Number: [id number] Patient Account Number: [id number] Date of Birth/Sex: Treating RN: 28-Mar-1981 (41 y.o. Seward Meth Primary Care Demir Titsworth: Pearson Grippe RA H Other Clinician: Referring Teddy Pena: Treating Wissam Resor/Extender: Haywood Pao, DEBO RA H Weeks in Treatment: 0 Clinic Level of Care Assessment Items TOOL 1 Quantity Score _0  - 0 Use when EandM and Procedure is performed on INITIAL visit ASSESSMENTS - Nursing Assessment / Reassessment _1  - 0 General Physical Exam (combine w/ comprehensive assessment (listed just below) when performed on new pt. evals) _2  - 0 Comprehensive Assessment (HX, ROS, Risk Assessments, Wounds Hx, etc.) ASSESSMENTS - Wound and Skin Assessment / Reassessment _3  - 0 Dermatologic / Skin Assessment (not related to wound area) ASSESSMENTS - Ostomy and/or Continence Assessment and Care _4  - 0 Incontinence Assessment and Management _5  - 0 Ostomy Care Assessment and Management (repouching, etc.) PROCESS - Coordination of Care _6  - 0 Simple Patient / Family Education for ongoing care _7  - 0 Complex (extensive) Patient / Family Education for ongoing care _8  - 0 Staff obtains Programmer, systems, Records, T Results / Process Orders est _9  - 0 Staff telephones HHA, Nursing Homes / Clarify orders / etc _10  - 0 Routine Transfer to another Facility (non-emergent condition) _11  - 0 Routine Hospital Admission (non-emergent condition) _12  - 0 New Admissions / Biomedical engineer / Ordering NPWT Apligraf, etc. , _13  - 0 Emergency Hospital Admission (emergent condition) PROCESS - Special Needs _14  - 0 Pediatric / Minor Patient Management _15  - 0 Isolation Patient Management _16  - 0 Hearing / Language / Visual special needs _17  - 0 Assessment of Community assistance (transportation, D/[name] planning, etc.) _18  - 0 Additional assistance / Altered mentation _19  - 0 Support Surface(s) Assessment (bed, cushion,  seat, etc.) INTERVENTIONS - Miscellaneous _20  - 0 External ear exam _21  - 0 Patient Transfer (multiple staff / Civil Service fast streamer / Similar devices) _22  - 0 Simple Staple / Suture removal (25 or less) [name], [name] ([id number]) [id number] Page 3 of 17 _23  - 0 Complex Staple /  Suture removal (26 or more) _0  - 0 Hypo/Hyperglycemic Management (do not check if billed separately) _1  - 0 Ankle / Brachial Index (ABI) - do not check if billed separately Has the patient been seen at the hospital within the last three years: Yes Total Score: 0 Level Of Care: ____ Electronic Signature(s) Signed: 05/13/2022 4:57:18 PM By: Rosalio Loud MSN RN CNS WTA Entered By: Rosalio Loud on 05/13/2022 14:43:03 -------------------------------------------------------------------------------- Encounter Discharge Information Details Patient Name: Date of Service: [name] 05/13/2022 1:00 PM Medical Record Number: [id number] Patient Account Number: [id number] Date of Birth/Sex: Treating RN: 1981-01-08 (41 y.o. Seward Meth Primary Care Shelsea Hangartner: Pearson Grippe RA H Other Clinician: Referring Jameya Pontiff: Treating [name]/Extender: Haywood Pao, DEBO RA H Weeks in Treatment: 0 Encounter Discharge Information Items Post Procedure Vitals Discharge Condition: Stable Temperature (F): 98.1 Ambulatory Status: Ambulatory Pulse (bpm): 108 Discharge Destination: Home Respiratory Rate (breaths/min): 16 Transportation: Private Auto Blood Pressure (mmHg): 136/85 Accompanied By: self Schedule Follow-up Appointment: Yes Clinical Summary of Care: Patient Declined Electronic Signature(s) Signed: 05/13/2022 4:52:15 PM By: Rosalio Loud MSN RN CNS WTA Entered By: Rosalio Loud on 05/13/2022 16:52:15 -------------------------------------------------------------------------------- Lower Extremity Assessment Details Patient Name: Date of Service: [name], [name]  05/13/2022 1:00 PM Medical Record Number: [id number] Patient Account Number: [id number] Date of Birth/Sex: Treating RN: April 03, 1981 (41 y.o. Seward Meth Primary Care Catalina Salasar: Pearson Grippe RA H Other Clinician: Referring Senta Kantor: Treating Nethra Mehlberg/Extender: Haywood Pao, DEBO RA H Weeks in Treatment: 0 Edema Assessment L[LeftIOKEPA, GEFFRE [name] ([id number])] [Right: [id number] Page 4 of 17] Assessed: [Left: No] [Right: No] [Left: Edema] [Right: :] Calf Left: Right: Point of Measurement: 40 cm From Medial Instep 45.4 cm 44.5 cm Ankle Left: Right: Point of Measurement: 11 cm From Medial Instep 26.7 cm 26.8 cm Vascular Assessment Pulses: Popliteal Doppler Audible: [Left:Yes] [Right:Yes] Blood Pressure: Brachial: [Left:120] [Right:110] Ankle: [Left:Dorsalis Pedis: 124 1.03] [Right:Dorsalis Pedis: 94 0.78] Electronic Signature(s) Signed: 05/13/2022 4:55:33 PM By: Rosalio Loud MSN RN CNS WTA Entered By: Rosalio Loud on 05/13/2022 16:55:33 -------------------------------------------------------------------------------- Multi Wound Chart Details Patient Name: Date of Service: [name] 05/13/2022 1:00 PM Medical Record Number: [id number] Patient Account Number: [id number] Date of Birth/Sex: Treating RN: 1980/06/02 (41 y.o. Seward Meth Primary Care Sinai Mahany: Pearson Grippe RA H Other Clinician: Referring Dal Blew: Treating Zayla Agar/Extender: Haywood Pao, DEBO RA H Weeks in Treatment: 0 Vital Signs Height(in): 73 Pulse(bpm): 108 Weight(lbs): 333 Blood Pressure(mmHg): 136/85 Body Mass Index(BMI): 43.9 Temperature(F): 98.1 Respiratory Rate(breaths/min): 16 [1:Photos:] Right, Lateral Foot Left, Medial Foot Left, Posterior Foot Wound Location: Gradually Appeared Gradually Appeared Gradually Appeared Wounding Event: Diabetic Wound/Ulcer of the Lower Diabetic Wound/Ulcer of the Lower Diabetic  Wound/Ulcer of the Lower Primary Etiology: Extremity Extremity Extremity Hypertension, Peripheral Venous Hypertension, Peripheral Venous Hypertension, Peripheral Venous Comorbid History: Disease, Type II Diabetes, Disease, Type II Diabetes, Disease, Type II Diabetes, Osteomyelitis Osteomyelitis Osteomyelitis 05/12/2022 02/23/2022 02/23/2022 Date Acquired: RAYSHON, ALBAUGH ([id number]) [id number] Page 5 of 17 0 0 0 Weeks of Treatment: Open Open Open Wound Status: No No No Wound Recurrence: No No No Clustered Wound: Yes Yes Yes Pending A mputation on Presentation: 0.3x0.6x0.1 1x0.5x0.1 3x2.5x0.2 Measurements L x W x D (cm) 0.141 0.393 5.89 A (cm) : rea 0.014 0.039 1.178 Volume (cm) : 3 Starting Position 1 (o'clock): 5 Ending Position 1 (o'clock): 2 Maximum Distance 1 (cm): Yes No No Undermining: Grade 1 Grade 1 Grade 1 Classification: Medium Medium  Medium Exudate A mount: Serosanguineous Serosanguineous Serosanguineous Exudate Type: red, brown red, brown red, brown Exudate Color: Small (1-33%) Small (1-33%) Small (1-33%) Granulation A mount: Red, Pink Red, Pink Red, Pink Granulation Quality: None Present (0%) Small (1-33%) Small (1-33%) Necrotic A mount: Fascia: No Fascia: No Fascia: No Exposed Structures: Fat Layer (Subcutaneous Tissue): No Fat Layer (Subcutaneous Tissue): No Fat Layer (Subcutaneous Tissue): No Tendon: No Tendon: No Tendon: No Muscle: No Muscle: No Muscle: No Joint: No Joint: No Joint: No Bone: No Bone: No Bone: No N/A Small (1-33%) Small (1-33%) Epithelialization: Debridement - Excisional Debridement - Excisional Debridement - Excisional Debridement: Pre-procedure Verification/Time Out 14:26 14:32 14:26 Taken: Lidocaine 4% Topical Solution Lidocaine 4% Topical Solution Lidocaine 4% Topical Solution Pain Control: Callus, Subcutaneous, Slough Subcutaneous, Slough Callus, Subcutaneous,  Slough Tissue Debrided: Skin/Subcutaneous Tissue Skin/Subcutaneous Tissue Skin/Subcutaneous Tissue Level: 0.18 0.5 7.5 Debridement A (sq cm): rea Curette, Forceps, Scissors Curette Curette Instrument: Minimum Minimum Minimum Bleeding: Pressure Pressure Pressure Hemostasis A chieved: Procedure was tolerated well Procedure was tolerated well Procedure was tolerated well Debridement Treatment Response: 1x2.5x0.2 1x0.5x0.2 3x2.5x0.2 Post Debridement Measurements L x W x D (cm) 0.393 0.079 1.178 Post Debridement Volume: (cm) Debridement Debridement Debridement Procedures Performed: Wound Number: 5 N/A N/A Photos: N/A N/A Left, Proximal, Posterior T Second oe N/A N/A Wound Location: Gradually Appeared N/A N/A Wounding Event: Diabetic Wound/Ulcer of the Lower N/A N/A Primary Etiology: Extremity Hypertension, Peripheral Venous N/A N/A Comorbid History: Disease, Type II Diabetes, Osteomyelitis 02/16/2022 N/A N/A Date Acquired: 0 N/A N/A Weeks of Treatment: Open N/A N/A Wound Status: No N/A N/A Wound Recurrence: Yes N/A N/A Clustered Wound: Yes N/A N/A Pending A mputation on Presentation: 2.5x1.5x0.1 N/A N/A Measurements L x W x D (cm) 2.945 N/A N/A A (cm) : rea 0.295 N/A N/A Volume (cm) : N/A N/A N/A Undermining: Grade 1 N/A N/A Classification: Medium N/A N/A Exudate A mount: Serosanguineous N/A N/A Exudate Type: red, brown N/A N/A Exudate Color: Small (1-33%) N/A N/A Granulation A mount: Red, Pink N/A N/A Granulation Quality: Small (1-33%) N/A N/A Necrotic A mount: Fascia: No N/A N/A Exposed Structures: Fat Layer (Subcutaneous Tissue): No Tendon: No Muscle: No Joint: No Bone: No N/A N/A N/A EpithelializationNASIRE, [name] ([id number]) [id number] Page 6 of 17 Debridement - Excisional N/A N/A Debridement: 14:26 N/A N/A Pre-procedure Verification/Time Out Taken: Lidocaine 4% Topical Solution N/A N/A Pain  Control: Callus, Subcutaneous, Slough N/A N/A Tissue Debrided: Skin/Subcutaneous Tissue N/A N/A Level: 4.12 N/A N/A Debridement A (sq cm): rea Curette, Forceps, Scissors N/A N/A Instrument: Minimum N/A N/A Bleeding: Pressure N/A N/A Hemostasis A chieved: Procedure was tolerated well N/A N/A Debridement Treatment Response: 2.5x1.5x0.2 N/A N/A Post Debridement Measurements L x W x D (cm) 0.589 N/A N/A Post Debridement Volume: (cm) Debridement N/A N/A Procedures Performed: Treatment Notes Wound #1 (Foot) Wound Laterality: Right, Lateral Cleanser Byram Ancillary Kit - 15 Day Supply Discharge Instruction: Use supplies as instructed; Kit contains: (15) Saline Bullets; (15) 3x3 Gauze; 15 pr Gloves Soap and Water Discharge Instruction: Gently cleanse wound with antibacterial soap, rinse and pat dry prior to dressing wounds Peri-Wound Care Topical Activon Honey Gel, 25 (g) Tube Primary Dressing Hydrofera Blue Ready Transfer Foam, 4x5 (in/in) Discharge Instruction: Apply Hydrofera Blue Ready to wound bed as directed Secondary Dressing ABD Pad 5x9 (in/in) Discharge Instruction: Cover with ABD pad Secured With Kerlix Roll Sterile or Non-Sterile 6-ply 4.5x4 (yd/yd) Discharge Instruction: Apply Kerlix as directed Compression Wrap Compression Stockings Add-Ons Wound #2 (Foot) Wound  Laterality: Left, Medial Cleanser Byram Ancillary Kit - 15 Day Supply Discharge Instruction: Use supplies as instructed; Kit contains: (15) Saline Bullets; (15) 3x3 Gauze; 15 pr Gloves Soap and Water Discharge Instruction: Gently cleanse wound with antibacterial soap, rinse and pat dry prior to dressing wounds Peri-Wound Care Topical Activon Honey Gel, 25 (g) Tube Primary Dressing Hydrofera Blue Ready Transfer Foam, 4x5 (in/in) Discharge Instruction: Apply Hydrofera Blue Ready to wound bed as directed Secondary Dressing ABD Pad 5x9 (in/in) Discharge Instruction: Cover with ABD pad Secured  With Kerlix Roll Sterile or Non-Sterile 6-ply 4.5x4 (yd/yd) Discharge Instruction: Apply Kerlix as directed Compression Wrap Compression Stockings [name], [name] ([id number]) [phone].pdf Page 7 of 17 Add-Ons Wound #3 (Foot) Wound Laterality: Left, Posterior Cleanser Byram Ancillary Kit - 15 Day Supply Discharge Instruction: Use supplies as instructed; Kit contains: (15) Saline Bullets; (15) 3x3 Gauze; 15 pr Gloves Soap and Water Discharge Instruction: Gently cleanse wound with antibacterial soap, rinse and pat dry prior to dressing wounds Peri-Wound Care Topical Activon Honey Gel, 25 (g) Tube Primary Dressing Hydrofera Blue Ready Transfer Foam, 4x5 (in/in) Discharge Instruction: Apply Hydrofera Blue Ready to wound bed as directed Secondary Dressing ABD Pad 5x9 (in/in) Discharge Instruction: Cover with ABD pad Secured With Kerlix Roll Sterile or Non-Sterile 6-ply 4.5x4 (yd/yd) Discharge Instruction: Apply Kerlix as directed Compression Wrap Compression Stockings Add-Ons Wound #5 (Toe Second) Wound Laterality: Left, Posterior, Proximal Cleanser Byram Ancillary Kit - 15 Day Supply Discharge Instruction: Use supplies as instructed; Kit contains: (15) Saline Bullets; (15) 3x3 Gauze; 15 pr Gloves Soap and Water Discharge Instruction: Gently cleanse wound with antibacterial soap, rinse and pat dry prior to dressing wounds Peri-Wound Care Topical Activon Honey Gel, 25 (g) Tube Primary Dressing Hydrofera Blue Ready Transfer Foam, 4x5 (in/in) Discharge Instruction: Apply Hydrofera Blue Ready to wound bed as directed Secondary Dressing ABD Pad 5x9 (in/in) Discharge Instruction: Cover with ABD pad Secured With Kerlix Roll Sterile or Non-Sterile 6-ply 4.5x4 (yd/yd) Discharge Instruction: Apply Kerlix as directed Compression Wrap Compression Stockings Add-Ons Electronic Signature(s) Signed: 05/13/2022 4:56:31 PM By: Rosalio Loud MSN RN CNS  WTA Previous Signature: 05/13/2022 4:54:10 PM Version By: Kalman Shan DO Entered By: Rosalio Loud on 05/13/2022 16:56:31 [name] ([id number]) [id number] Page 8 of 17 -------------------------------------------------------------------------------- Multi-Disciplinary Care Plan Details Patient Name: Date of Service: BARTOW, ZYLSTRA 05/13/2022 1:00 PM Medical Record Number: [id number] Patient Account Number: [id number] Date of Birth/Sex: Treating RN: [birth year] (41 y.o. Seward Meth Primary Care Emmry Hinsch: Pearson Grippe RA H Other Clinician: Referring Lillah Standre: Treating Caryn Gienger/Extender: Haywood Pao, DEBO RA H Weeks in Treatment: 0 Active Inactive Necrotic Tissue Nursing Diagnoses: Impaired tissue integrity related to necrotic/devitalized tissue Knowledge deficit related to management of necrotic/devitalized tissue Goals: Necrotic/devitalized tissue will be minimized in the wound bed Date Initiated: 05/13/2022 Target Resolution Date: 06/13/2022 Goal Status: Active Patient/caregiver will verbalize understanding of reason and process for debridement of necrotic tissue Date Initiated: 05/13/2022 Target Resolution Date: 06/13/2022 Goal Status: Active Interventions: Assess patient pain level pre-, during and post procedure and prior to discharge Provide education on necrotic tissue and debridement process Treatment Activities: Apply topical anesthetic as ordered : 05/13/2022 Excisional debridement : 05/13/2022 Notes: Orientation to the Wound Care Program Nursing Diagnoses: Knowledge deficit related to the wound healing center program Goals: Patient/caregiver will verbalize understanding of the [name] Date Initiated: 05/13/2022 Target Resolution Date: 05/30/2022 Goal Status: Active Interventions: Provide education on orientation to the wound center Notes: Soft Tissue Infection  Nursing  Diagnoses: Impaired tissue integrity Knowledge deficit related to disease process and management Knowledge deficit related to home infection control: handwashing, handling of soiled dressings, supply storage Goals: Patient will remain free of wound infection Date Initiated: 05/13/2022 Target Resolution Date: 06/13/2022 Goal Status: Active Patient/caregiver will verbalize understanding of or measures to prevent infection and contamination in the home setting Date Initiated: 05/13/2022 Target Resolution Date: 06/13/2022 [name], [name] ([id number]) [phone].pdf Page 9 of 17 Goal Status: Active Patient's soft tissue infection will resolve Date Initiated: 05/13/2022 Target Resolution Date: 06/13/2022 Goal Status: Active Signs and symptoms of infection will be recognized early to allow for prompt treatment Date Initiated: 05/13/2022 Target Resolution Date: 06/13/2022 Goal Status: Active Interventions: Assess signs and symptoms of infection every visit Provide education on infection Treatment Activities: Systemic antibiotics : 05/13/2022 Notes: Wound/Skin Impairment Nursing Diagnoses: Impaired tissue integrity Knowledge deficit related to ulceration/compromised skin integrity Goals: Patient will have a decrease in wound volume by X% from date: (specify in notes) Date Initiated: 05/13/2022 Target Resolution Date: 06/13/2022 Goal Status: Active Patient/caregiver will verbalize understanding of skin care regimen Date Initiated: 05/13/2022 Target Resolution Date: 06/13/2022 Goal Status: Active Ulcer/skin breakdown will have a volume reduction of 30% by week 4 Date Initiated: 05/13/2022 Target Resolution Date: 06/13/2022 Goal Status: Active Ulcer/skin breakdown will have a volume reduction of 50% by week 8 Date Initiated: 05/13/2022 Target Resolution Date: 07/14/2022 Goal Status: Active Ulcer/skin breakdown will have a volume reduction of 80% by week  12 Date Initiated: 05/13/2022 Target Resolution Date: 08/12/2022 Goal Status: Active Ulcer/skin breakdown will heal within 14 weeks Date Initiated: 05/13/2022 Target Resolution Date: 08/26/2022 Goal Status: Active Interventions: Assess patient/caregiver ability to obtain necessary supplies Assess patient/caregiver ability to perform ulcer/skin care regimen upon admission and as needed Assess ulceration(s) every visit Provide education on ulcer and skin care Treatment Activities: Referred to DME [employer] for dressing supplies : 05/13/2022 Skin care regimen initiated : 05/13/2022 Topical wound management initiated : 05/13/2022 Notes: Electronic Signature(s) Signed: 05/13/2022 4:51:40 PM By: Rosalio Loud MSN RN CNS WTA Entered By: Rosalio Loud on 05/13/2022 16:51:40 Pain Assessment Details -------------------------------------------------------------------------------- [name] ([id number]) [id number] Page 10 of 17 Patient Name: Date of Service: [name], [name] 05/13/2022 1:00 PM Medical Record Number: [id number] Patient Account Number: [id number] Date of Birth/Sex: Treating RN: [birth year] (41 y.o. Seward Meth Primary Care Tyeshia Cornforth: Pearson Grippe RA H Other Clinician: Referring Danna Casella: Treating Johneric Mcfadden/Extender: Haywood Pao, DEBO RA H Weeks in Treatment: 0 Active Problems Location of Pain Severity and Description of Pain Patient Has Paino No Site Locations Pain Management and Medication Current Pain Management: Electronic Signature(s) Signed: 05/13/2022 4:57:18 PM By: Rosalio Loud MSN RN CNS WTA Entered By: Rosalio Loud on 05/13/2022 13:16:21 -------------------------------------------------------------------------------- Patient/Caregiver Education Details Patient Name: Date of Service: [name] 12/20/2023andnbsp1:00 PM Medical Record Number: [id number] Patient Account Number: [id number] Date of  Birth/Gender: Treating RN: 21-Mar-1981 (41 y.o. Seward Meth Primary Care Physician: Pearson Grippe RA H Other Clinician: Referring Physician: Treating Physician/Extender: Haywood Pao, DEBO RA H Weeks in Treatment: 0 Education Assessment Education Provided To: Patient Education Topics Provided Wound Debridement: Handouts: Wound Debridement Methods: Explain/Verbal Responses: State content correctly Wound/Skin ImpairmentIRL, BODIE ([id number]) [id number] Page 11 of 17 Handouts: Caring for Your Ulcer Methods: Explain/Verbal Responses: State content correctly Electronic Signature(s) Signed: 05/13/2022 4:57:18 PM By: Rosalio Loud MSN RN CNS WTA Entered By: Rosalio Loud on 05/13/2022 16:48:48 -------------------------------------------------------------------------------- Wound Assessment Details Patient  Name: Date of Service: [name], BUCKLIN 05/13/2022 1:00 PM Medical Record Number: [id number] Patient Account Number: [id number] Date of Birth/Sex: Treating RN: [birth year] (41 y.o. Seward Meth Primary Care Arietta Eisenstein: Pearson Grippe RA H Other Clinician: Referring Alice Burnside: Treating Tahlor Berenguer/Extender: Haywood Pao, DEBO RA H Weeks in Treatment: 0 Wound Status Wound Number: 1 Primary Diabetic Wound/Ulcer of the Lower Extremity Etiology: Wound Location: Right, Lateral Foot Wound Status: Open Wounding Event: Gradually Appeared Comorbid Hypertension, Peripheral Venous Disease, Type II Diabetes, Date Acquired: 05/12/2022 History: Osteomyelitis Weeks Of Treatment: 0 Clustered Wound: No Pending Amputation On Presentation Photos Wound Measurements Length: (cm) 0.3 Width: (cm) 0.6 Depth: (cm) 0.1 Area: (cm) 0.141 Volume: (cm) 0.014 % Reduction in Area: % Reduction in Volume: Tunneling: No Undermining: Yes Starting Position (o'clock): 3 Ending Position (o'clock): 5 Maximum Distance: (cm) 2 Wound  Description Classification: Grade 1 Exudate Amount: Medium Exudate Type: Serosanguineous Exudate Color: red, brown Foul Odor After Cleansing: No Slough/Fibrino No Wound Bed Granulation Amount: Small (1-33%) Exposed CEDRICK, PARTAIN [name] ([id number]) [phone].pdf Page 12 of 17 Granulation Quality: Red, Pink Fascia Exposed: No Necrotic Amount: None Present (0%) Fat Layer (Subcutaneous Tissue) Exposed: No Tendon Exposed: No Muscle Exposed: No Joint Exposed: No Bone Exposed: No Treatment Notes Wound #1 (Foot) Wound Laterality: Right, Lateral Cleanser Byram Ancillary Kit - 15 Day Supply Discharge Instruction: Use supplies as instructed; Kit contains: (15) Saline Bullets; (15) 3x3 Gauze; 15 pr Gloves Soap and Water Discharge Instruction: Gently cleanse wound with antibacterial soap, rinse and pat dry prior to dressing wounds Peri-Wound Care Topical Activon Honey Gel, 25 (g) Tube Primary Dressing Hydrofera Blue Ready Transfer Foam, 4x5 (in/in) Discharge Instruction: Apply Hydrofera Blue Ready to wound bed as directed Secondary Dressing ABD Pad 5x9 (in/in) Discharge Instruction: Cover with ABD pad Secured With Kerlix Roll Sterile or Non-Sterile 6-ply 4.5x4 (yd/yd) Discharge Instruction: Apply Kerlix as directed Compression Wrap Compression Stockings Add-Ons Electronic Signature(s) Signed: 05/13/2022 4:54:14 PM By: Rosalio Loud MSN RN CNS WTA Entered By: Rosalio Loud on 05/13/2022 16:54:14 -------------------------------------------------------------------------------- Wound Assessment Details Patient Name: Date of Service: [name] 05/13/2022 1:00 PM Medical Record Number: [id number] Patient Account Number: [id number] Date of Birth/Sex: Treating RN: [birth year] (40 y.o. Sew[place] Meth Primary Care Caitlynn Ju: Pearson Grippe RA H Other Clinician: Referring Regana Kemple: Treating Sayid Moll/Extender: Haywood Pao, DEBO RA  H Weeks in Treatment: 0 Wound Status Wound Number: 2 Primary Diabetic Wound/Ulcer of the Lower Extremity Etiology: Wound Location: Left, Medial Foot Wound Status: Open Wounding Event: Gradually Appeared Comorbid Hypertension, Peripheral Venous Disease, Type II Diabetes, Date Acquired: 02/23/2022 History: Osteomyelitis Weeks Of Treatment: 0 Clustered Wound: No Pending Amputation On Presentation ARBY, DAHIR ([id number]) [id number].pdf Page 13 of 17 Photos Wound Measurements Length: (cm) 1 Width: (cm) 0.5 Depth: (cm) 0.1 Area: (cm) 0.393 Volume: (cm) 0.039 % Reduction in Area: % Reduction in Volume: Epithelialization: Small (1-33%) Tunneling: No Undermining: No Wound Description Classification: Grade 1 Exudate Amount: Medium Exudate Type: Serosanguineous Exudate Color: red, brown Foul Odor After Cleansing: No Slough/Fibrino No Wound Bed Granulation Amount: Small (1-33%) Exposed Structure Granulation Quality: Red, Pink Fascia Exposed: No Necrotic Amount: Small (1-33%) Fat Layer (Subcutaneous Tissue) Exposed: No Necrotic Quality: Adherent Slough Tendon Exposed: No Muscle Exposed: No Joint Exposed: No Bone Exposed: No Treatment Notes Wound #2 (Foot) Wound Laterality: Left, Medial Cleanser Byram Ancillary Kit - 15 Day Supply Discharge Instruction: Use supplies as instructed; Kit contains: (15) Saline Bullets; (15) 3x3 Gauze; 15  pr Gloves Soap and Water Discharge Instruction: Gently cleanse wound with antibacterial soap, rinse and pat dry prior to dressing wounds Peri-Wound Care Topical Activon Honey Gel, 25 (g) Tube Primary Dressing Hydrofera Blue Ready Transfer Foam, 4x5 (in/in) Discharge Instruction: Apply Hydrofera Blue Ready to wound bed as directed Secondary Dressing ABD Pad 5x9 (in/in) Discharge Instruction: Cover with ABD pad Secured With Kerlix Roll Sterile or Non-Sterile 6-ply 4.5x4 (yd/yd) Discharge Instruction: Apply  Kerlix as directed Compression Wrap Compression Stockings Add-Ons Electronic Signature(s) Signed: 05/13/2022 4:54:40 PM By: Rosalio Loud MSN RN CNS WTA Entered By: Rosalio Loud on 05/13/2022 16:54:40 [name] ([id number]) [id number].pdf Page 14 of 17 -------------------------------------------------------------------------------- Wound Assessment Details Patient Name: Date of Service: MYREON, WIMER 05/13/2022 1:00 PM Medical Record Number: [id number] Patient Account Number: [id number] Date of Birth/Sex: Treating RN: [birth year] (41 y.o. [place] Meth Primary Care Finneas Mathe: Pearson Grippe RA H Other Clinician: Referring Alexandrya Chim: Treating Kambree Krauss/Extender: Haywood Pao, DEBO RA H Weeks in Treatment: 0 Wound Status Wound Number: 3 Primary Diabetic Wound/Ulcer of the Lower Extremity Etiology: Wound Location: Left, Posterior Foot Wound Status: Open Wounding Event: Gradually Appeared Comorbid Hypertension, Peripheral Venous Disease, Type II Diabetes, Date Acquired: 02/23/2022 History: Osteomyelitis Weeks Of Treatment: 0 Clustered Wound: No Pending Amputation On Presentation Photos Wound Measurements Length: (cm) 3 Width: (cm) 2.5 Depth: (cm) 0.2 Area: (cm) 5.89 Volume: (cm) 1.178 % Reduction in Area: % Reduction in Volume: Epithelialization: Small (1-33%) Tunneling: No Undermining: No Wound Description Classification: Grade 1 Exudate Amount: Medium Exudate Type: Serosanguineous Exudate Color: red, brown Foul Odor After Cleansing: No Slough/Fibrino No Wound Bed Granulation Amount: Small (1-33%) Exposed Structure Granulation Quality: Red, Pink Fascia Exposed: No Necrotic Amount: Small (1-33%) Fat Layer (Subcutaneous Tissue) Exposed: No Necrotic Quality: Adherent Slough Tendon Exposed: No Muscle Exposed: No Joint Exposed: No Bone Exposed: No Treatment Notes Wound #3 (Foot) Wound Laterality: Left,  Posterior Cleanser Byram Ancillary Kit - 15 Day Supply Discharge Instruction: Use supplies as instructed; Kit contains: (15) Saline Bullets; (15) 3x3 Gauze; 15 pr Gloves RAFORD, BRISSETT [name] ([id number]) [id number] Page 15 of 17 Soap and Water Discharge Instruction: Gently cleanse wound with antibacterial soap, rinse and pat dry prior to dressing wounds Peri-Wound Care Topical Activon Honey Gel, 25 (g) Tube Primary Dressing Hydrofera Blue Ready Transfer Foam, 4x5 (in/in) Discharge Instruction: Apply Hydrofera Blue Ready to wound bed as directed Secondary Dressing ABD Pad 5x9 (in/in) Discharge Instruction: Cover with ABD pad Secured With Kerlix Roll Sterile or Non-Sterile 6-ply 4.5x4 (yd/yd) Discharge Instruction: Apply Kerlix as directed Compression Wrap Compression Stockings Add-Ons Electronic Signature(s) Signed: 05/13/2022 4:55:00 PM By: Rosalio Loud MSN RN CNS WTA Entered By: Rosalio Loud on 05/13/2022 16:55:00 -------------------------------------------------------------------------------- Wound Assessment Details Patient Name: Date of Service: [name] 05/13/2022 1:00 PM Medical Record Number: [id number] Patient Account Number: [id number] Date of Birth/Sex: Treating RN: Sep 11, 1980 (41 y.o. [place] Meth Primary Care Dealva Lafoy: Pearson Grippe RA H Other Clinician: Referring Presli Fanguy: Treating Pernell Lenoir/Extender: Haywood Pao, DEBO RA H Weeks in Treatment: 0 Wound Status Wound Number: 5 Primary Diabetic Wound/Ulcer of the Lower Extremity Etiology: Wound Location: Left, Proximal, Posterior T Second oe Wound Status: Open Wounding Event: Gradually Appeared Comorbid Hypertension, Peripheral Venous Disease, Type II Diabetes, Date Acquired: 02/16/2022 History: Osteomyelitis Weeks Of Treatment: 0 Clustered Wound: Yes Pending Amputation On Presentation Photos HARDEN, BRAMER ([id number])  [phone].pdf Page 16 of 17 Wound Measurements Length: (cm) Width: (cm) Depth: (cm) Area: (cm) Volume: (cm)  2.5 % Reduction in Area: 1.5 % Reduction in Volume: 0.1 2.945 0.295 Wound Description Classification: Grade 1 Exudate Amount: Medium Exudate Type: Serosanguineous Exudate Color: red, brown Foul Odor After Cleansing: No Slough/Fibrino No Wound Bed Granulation Amount: Small (1-33%) Exposed Structure Granulation Quality: Red, Pink Fascia Exposed: No Necrotic Amount: Small (1-33%) Fat Layer (Subcutaneous Tissue) Exposed: No Necrotic Quality: Adherent Slough Tendon Exposed: No Muscle Exposed: No Joint Exposed: No Bone Exposed: No Treatment Notes Wound #5 (Toe Second) Wound Laterality: Left, Posterior, Proximal Cleanser Byram Ancillary Kit - 15 Day Supply Discharge Instruction: Use supplies as instructed; Kit contains: (15) Saline Bullets; (15) 3x3 Gauze; 15 pr Gloves Soap and Water Discharge Instruction: Gently cleanse wound with antibacterial soap, rinse and pat dry prior to dressing wounds Peri-Wound Care Topical Activon Honey Gel, 25 (g) Tube Primary Dressing Hydrofera Blue Ready Transfer Foam, 4x5 (in/in) Discharge Instruction: Apply Hydrofera Blue Ready to wound bed as directed Secondary Dressing ABD Pad 5x9 (in/in) Discharge Instruction: Cover with ABD pad Secured With Kerlix Roll Sterile or Non-Sterile 6-ply 4.5x4 (yd/yd) Discharge Instruction: Apply Kerlix as directed Compression Wrap Compression Stockings Add-Ons Electronic Signature(s) Signed: 05/13/2022 4:55:23 PM By: Rosalio Loud MSN RN CNS WTA Entered By: Rosalio Loud on 05/13/2022 16:55:23 Vitals Details -------------------------------------------------------------------------------- [name] ([id number]) [id number] Page 17 of 17 Patient Name: Date of Service: DEZMAN, GRANDA 05/13/2022 1:00 PM Medical Record Number:  [id number] Patient Account Number: [id number] Date of Birth/Sex: Treating RN: 1980-07-11 (41 y.o. [place] Meth Primary Care Loc Feinstein: Pearson Grippe RA H Other Clinician: Referring Shantanu Strauch: Treating Williemae Muriel/Extender: Haywood Pao, DEBO RA H Weeks in Treatment: 0 Vital Signs Time Taken: 13:16 Temperature (F): 98.1 Height (in): 73 Pulse (bpm): 108 Source: Stated Respiratory Rate (breaths/min): 16 Weight (lbs): 333 Blood Pressure (mmHg): 136/85 Source: Stated Reference Range: 80 - 120 mg / dl Body Mass Index (BMI): 43.9 Electronic Signature(s) Signed: 05/13/2022 4:57:18 PM By: Rosalio Loud MSN RN CNS WTA Entered By: Rosalio Loud on 05/13/2022 13:16:59

## 2022-05-14 NOTE — Progress Notes (Signed)
AIVEN, KAMPE ([id number]) [id number] Page 1 of 13 Visit Report for 05/13/2022 Chief Complaint Document Details Patient Name: Date of Service: [name], [name] 05/13/2022 1:00 PM Medical Record Number: [id number] Patient Account Number: [id number] Date of Birth/Sex: Treating RN: 16-May-1981 (41 y.o. Male) Rosalio Loud Primary Care Provider: Karle Plumber Other Clinician: Referring Provider: Treating Provider/Extender: Sammuel Bailiff in Treatment: 0 Information Obtained from: Patient Chief Complaint 05/13/2022; bilateral feet wounds Electronic Signature(s) Signed: 05/13/2022 4:54:10 PM By: Kalman Shan DO Entered By: Kalman Shan on 05/13/2022 14:53:55 -------------------------------------------------------------------------------- Debridement Details Patient Name: Date of Service: [name] 05/13/2022 1:00 PM Medical Record Number: [id number] Patient Account Number: [id number] Date of Birth/Sex: Treating RN: 1980/05/27 (41 y.o. Male) Rosalio Loud Primary Care Provider: Karle Plumber Other Clinician: Referring Provider: Treating Provider/Extender: Sammuel Bailiff in Treatment: 0 Debridement Performed for Assessment: Wound #1 Right,Lateral Foot Performed By: Physician Kalman Shan, MD Debridement Type: Debridement Level of Consciousness (Pre-procedure): Awake and Alert Pre-procedure Verification/Time Out Yes - 14:26 Taken: Start Time: 14:26 Pain Control: Lidocaine 4% T opical Solution T Area Debrided (L x W): otal 0.3 (cm) x 0.6 (cm) = 0.18 (cm) Tissue and other material debrided: Viable, Non-Viable, Callus, Slough, Subcutaneous, Skin: Dermis , Skin: Epidermis, Slough Level: Skin/Subcutaneous Tissue Debridement Description: Excisional Instrument: Curette, Forceps, Scissors Bleeding: Minimum Hemostasis Achieved: Pressure Response to Treatment: Procedure was  tolerated well Level of Consciousness (Post- Awake and Alert procedure): [name], [name] ([id number]) [id number] Page 2 of 13 Post Debridement Measurements of Total Wound Length: (cm) 1 Width: (cm) 2.5 Depth: (cm) 0.2 Volume: (cm) 0.393 Character of Wound/Ulcer Post Debridement: Stable Post Procedure Diagnosis Same as Pre-procedure Electronic Signature(s) Signed: 05/13/2022 4:54:10 PM By: Kalman Shan DO Signed: 05/13/2022 4:57:18 PM By: Rosalio Loud MSN RN CNS WTA Entered By: Rosalio Loud on 05/13/2022 14:29:24 -------------------------------------------------------------------------------- Debridement Details Patient Name: Date of Service: [name] 05/13/2022 1:00 PM Medical Record Number: [id number] Patient Account Number: [id number] Date of Birth/Sex: Treating RN: [birth year] (41 y.o. Male) Rosalio Loud Primary Care Provider: Karle Plumber Other Clinician: Referring Provider: Treating Provider/Extender: Sammuel Bailiff in Treatment: 0 Debridement Performed for Assessment: Wound #2 Left,Medial Foot Performed By: Physician Kalman Shan, MD Debridement Type: Debridement Level of Consciousness (Pre-procedure): Awake and Alert Pre-procedure Verification/Time Out Yes - 14:32 Taken: Start Time: 14:32 Pain Control: Lidocaine 4% T opical Solution T Area Debrided (L x W): otal 1 (cm) x 0.5 (cm) = 0.5 (cm) Tissue and other material debrided: Viable, Non-Viable, Slough, Subcutaneous, Skin: Dermis , Slough Level: Skin/Subcutaneous Tissue Debridement Description: Excisional Instrument: Curette Bleeding: Minimum Hemostasis Achieved: Pressure Response to Treatment: Procedure was tolerated well Level of Consciousness (Post- Awake and Alert procedure): Post Debridement Measurements of Total Wound Length: (cm) 1 Width: (cm) 0.5 Depth: (cm) 0.2 Volume: (cm) 0.079 Character of Wound/Ulcer Post  Debridement: Stable Post Procedure Diagnosis Same as Pre-procedure Electronic Signature(s) Signed: 05/13/2022 4:54:10 PM By: Kalman Shan DO Signed: 05/13/2022 4:57:18 PM By: Rosalio Loud MSN RN CNS WTA Entered By: Rosalio Loud on 05/13/2022 14:32:57 [name] ([id number]) [id number] Page 3 of 13 -------------------------------------------------------------------------------- Debridement Details Patient Name: Date of Service: [name], [name] 05/13/2022 1:00 PM Medical Record Number: [id number] Patient Account Number: [id number] Date of Birth/Sex: Treating RN: 1980-08-25 (41 y.o. Male) Rosalio Loud Primary Care Provider: Karle Plumber Other Clinician: Referring Provider: Treating Provider/Extender: Sammuel Bailiff in Treatment: 0 Debridement Performed for Assessment: Wound #3 [place] Performed By: Physician Kalman Shan, MD Debridement Type:  Debridement Level of Consciousness (Pre-procedure): Awake and Alert Pre-procedure Verification/Time Out Yes - 14:26 Taken: Start Time: 14:26 Pain Control: Lidocaine 4% T opical Solution T Area Debrided (L x W): otal 3 (cm) x 2.5 (cm) = 7.5 (cm) Tissue and other material debrided: Viable, Non-Viable, Callus, Slough, Subcutaneous, Skin: Dermis , Skin: Epidermis, Slough Level: Skin/Subcutaneous Tissue Debridement Description: Excisional Instrument: Curette Bleeding: Minimum Hemostasis Achieved: Pressure Response to Treatment: Procedure was tolerated well Level of Consciousness (Post- Awake and Alert procedure): Post Debridement Measurements of Total Wound Length: (cm) 3 Width: (cm) 2.5 Depth: (cm) 0.2 Volume: (cm) 1.178 Character of Wound/Ulcer Post Debridement: Stable Post Procedure Diagnosis Same as Pre-procedure Electronic Signature(s) Signed: 05/13/2022 4:54:10 PM By: Kalman Shan DO Signed: 05/13/2022 4:57:18 PM By: Rosalio Loud MSN  RN CNS WTA Entered By: Rosalio Loud on 05/13/2022 14:33:49 -------------------------------------------------------------------------------- Debridement Details Patient Name: Date of Service: [name] 05/13/2022 1:00 PM Medical Record Number: [id number] Patient Account Number: [id number] Date of Birth/Sex: Treating RN: July 11, 1980 (41 y.o. Male) Rosalio Loud Primary Care Provider: Karle Plumber Other Clinician: Referring Provider: Treating Provider/Extender: Sammuel Bailiff in Treatment: 0 [name], [name] ([id number]) [phone].pdf Page 4 of 13 Debridement Performed for Assessment: Wound #5 Left,Proximal,Posterior Foot Performed By: Physician Kalman Shan, MD Debridement Type: Debridement Level of Consciousness (Pre-procedure): Awake and Alert Pre-procedure Verification/Time Out Yes - 14:26 Taken: Start Time: 14:26 Pain Control: Lidocaine 4% T opical Solution T Area Debrided (L x W): otal 2.5 (cm) x 1.65 (cm) = 4.12 (cm) Tissue and other material debrided: Viable, Non-Viable, Callus, Slough, Subcutaneous, Skin: Dermis , Skin: Epidermis, Slough Level: Skin/Subcutaneous Tissue Debridement Description: Excisional Instrument: Curette, Forceps, Scissors Bleeding: Minimum Hemostasis Achieved: Pressure Response to Treatment: Procedure was tolerated well Level of Consciousness (Post- Awake and Alert procedure): Post Debridement Measurements of Total Wound Length: (cm) 2.5 Width: (cm) 1.5 Depth: (cm) 0.2 Volume: (cm) 0.589 Character of Wound/Ulcer Post Debridement: Stable Post Procedure Diagnosis Same as Pre-procedure Electronic Signature(s) Signed: 05/13/2022 4:54:10 PM By: Kalman Shan DO Signed: 05/13/2022 4:57:18 PM By: Rosalio Loud MSN RN CNS WTA Entered By: Rosalio Loud on 05/13/2022 14:34:38 -------------------------------------------------------------------------------- HPI Details Patient  Name: Date of Service: [name] 05/13/2022 1:00 PM Medical Record Number: [id number] Patient Account Number: [id number] Date of Birth/Sex: Treating RN: Nov 26, 1980 (41 y.o. Male) Rosalio Loud Primary Care Provider: Karle Plumber Other Clinician: Referring Provider: Treating Provider/Extender: Sammuel Bailiff in Treatment: 0 History of Present Illness HPI Description: 05/13/2022 [name] is a 41 year old male with uncontrolled insulin-dependent type 2 diabetes with last hemoglobin A1c of 11.1, previous feet wounds that led to bilateral great toe amputations, ADHD and bipolar depression that presents the clinic for a 1 month history of non healing ulcers to the Feet bilaterally. His previous bilateral great toe amputation sites have healed. He has been using an antibacterial spray to keep the areas clean. He has not been dressing the wounds. He states that over the past couple days he has had increased redness and warmth to the right foot. He has regular tennis shoes. He does not use any offloading device. He states he walks around in socks and does not go barefoot. Electronic Signature(s) Signed: 05/13/2022 4:54:10 PM By: Kalman Shan DO Entered By: Kalman Shan on 05/13/2022 15:02:31 [name] ([id number]) [id number] Page 5 of 13 -------------------------------------------------------------------------------- Physical Exam Details Patient Name: Date of Service: [name], [name] 05/13/2022 1:00 PM Medical Record Number: [id number] Patient Account Number: [id number] Date of Birth/Sex: Treating RN: [birth year] (  41 y.o. Male) Rosalio Loud Primary Care Provider: Karle Plumber Other Clinician: Referring Provider: Treating Provider/Extender: Sammuel Bailiff in Treatment: 0 Constitutional . Cardiovascular . Psychiatric . Notes Left foot to the medial aspect of  the first met head there is an open wound with devitalized tissue and nonviable tissue. This undermines. Once debrided there is healthy granulation tissue present. Increased warmth and erythema to the periwound. Right foot: T the plantar aspect there are multiple open wounds. No undermining noted to these wounds. Granulation tissue present although not the healthiest o appearance. Electronic Signature(s) Signed: 05/13/2022 4:54:10 PM By: Kalman Shan DO Entered By: Kalman Shan on 05/13/2022 15:09:51 -------------------------------------------------------------------------------- Physician Orders Details Patient Name: Date of Service: [name] 05/13/2022 1:00 PM Medical Record Number: [id number] Patient Account Number: [id number] Date of Birth/Sex: Treating RN: 07/02/1980 (41 y.o. Male) Rosalio Loud Primary Care Provider: Karle Plumber Other Clinician: Referring Provider: Treating Provider/Extender: Sammuel Bailiff in Treatment: 0 Verbal / Phone Orders: No Diagnosis Coding ICD-10 Coding Code Description E11.621 Type 2 diabetes mellitus with foot ulcer L97.522 Non-pressure chronic ulcer of other part of left foot with fat layer exposed L97.512 Non-pressure chronic ulcer of other part of right foot with fat layer exposed I87.2 Venous insufficiency (chronic) (peripheral) Z89.412 Acquired absence of left great toe [name], [name] ([id number]) [id number] Page 6 of 13 Follow-up Appointments ppointment in 1 week. - on Wed then on Friday and following Wed Return A Wound Treatment Wound #1 - Foot Wound Laterality: Right, Lateral Cleanser: Byram Ancillary Kit - 15 Day Supply (DME) (Generic) 1 x Per Day/30 Days Discharge Instructions: Use supplies as instructed; Kit contains: (15) Saline Bullets; (15) 3x3 Gauze; 15 pr Gloves Cleanser: Soap and Water 1 x Per Day/30 Days Discharge Instructions: Gently cleanse  wound with antibacterial soap, rinse and pat dry prior to dressing wounds Topical: Activon Honey Gel, 25 (g) Tube 1 x Per Day/30 Days Prim Dressing: Hydrofera Blue Ready Transfer Foam, 4x5 (in/in) (DME) (Generic) 1 x Per Day/30 Days ary Discharge Instructions: Apply Hydrofera Blue Ready to wound bed as directed Secondary Dressing: ABD Pad 5x9 (in/in) 1 x Per Day/30 Days Discharge Instructions: Cover with ABD pad Secured With: Kerlix Roll Sterile or Non-Sterile 6-ply 4.5x4 (yd/yd) 1 x Per Day/30 Days Discharge Instructions: Apply Kerlix as directed Wound #2 - Foot Wound Laterality: Left, Medial Cleanser: Byram Ancillary Kit - 15 Day Supply (DME) (Generic) 1 x Per Day/30 Days Discharge Instructions: Use supplies as instructed; Kit contains: (15) Saline Bullets; (15) 3x3 Gauze; 15 pr Gloves Cleanser: Soap and Water 1 x Per Day/30 Days Discharge Instructions: Gently cleanse wound with antibacterial soap, rinse and pat dry prior to dressing wounds Topical: Activon Honey Gel, 25 (g) Tube 1 x Per Day/30 Days Prim Dressing: Hydrofera Blue Ready Transfer Foam, 4x5 (in/in) (DME) (Generic) 1 x Per Day/30 Days ary Discharge Instructions: Apply Hydrofera Blue Ready to wound bed as directed Secondary Dressing: ABD Pad 5x9 (in/in) 1 x Per Day/30 Days Discharge Instructions: Cover with ABD pad Secured With: Kerlix Roll Sterile or Non-Sterile 6-ply 4.5x4 (yd/yd) 1 x Per Day/30 Days Discharge Instructions: Apply Kerlix as directed Wound #3 - Foot Wound Laterality: Left, Posterior Cleanser: Byram Ancillary Kit - 15 Day Supply (DME) (Generic) 1 x Per Day/30 Days Discharge Instructions: Use supplies as instructed; Kit contains: (15) Saline Bullets; (15) 3x3 Gauze; 15 pr Gloves Cleanser: Soap and Water 1 x Per Day/30 Days Discharge Instructions: Gently cleanse wound with antibacterial soap, rinse and  pat dry prior to dressing wounds Topical: Activon Honey Gel, 25 (g) Tube 1 x Per Day/30 Days Prim Dressing:  Hydrofera Blue Ready Transfer Foam, 4x5 (in/in) (DME) (Generic) 1 x Per Day/30 Days ary Discharge Instructions: Apply Hydrofera Blue Ready to wound bed as directed Secondary Dressing: ABD Pad 5x9 (in/in) 1 x Per Day/30 Days Discharge Instructions: Cover with ABD pad Secured With: Kerlix Roll Sterile or Non-Sterile 6-ply 4.5x4 (yd/yd) 1 x Per Day/30 Days Discharge Instructions: Apply Kerlix as directed Wound #5 - T Second oe Wound Laterality: Left, Posterior, Proximal Cleanser: Byram Ancillary Kit - 15 Day Supply (DME) (Generic) 1 x Per Day/30 Days Discharge Instructions: Use supplies as instructed; Kit contains: (15) Saline Bullets; (15) 3x3 Gauze; 15 pr Gloves Cleanser: Soap and Water 1 x Per Day/30 Days Discharge Instructions: Gently cleanse wound with antibacterial soap, rinse and pat dry prior to dressing wounds Topical: Activon Honey Gel, 25 (g) Tube 1 x Per Day/30 Days Prim Dressing: Hydrofera Blue Ready Transfer Foam, 4x5 (in/in) (DME) (Generic) 1 x Per Day/30 Days ary Discharge Instructions: Apply Hydrofera Blue Ready to wound bed as directed Secondary Dressing: ABD Pad 5x9 (in/in) 1 x Per Day/30 Days Discharge Instructions: Cover with ABD pad Secured With: Kerlix Roll Sterile or Non-Sterile 6-ply 4.5x4 (yd/yd) 1 x Per Day/30 Days Discharge Instructions: Apply Kerlix as directed [name], [name] ([id number]) [id number] Page 7 of 13 Patient Medications llergies: lactose A Notifications Medication Indication Start End 05/13/2022 doxycycline hyclate DOSE 1 - oral 100 mg tablet - 1 tablet oral twice a day x 10 days 05/13/2022 amoxicillin-pot clavulanate DOSE 1 - oral 875 mg-125 mg tablet - 1 tablet oral twice a day x 10 days Electronic Signature(s) Signed: 05/13/2022 4:54:10 PM By: Kalman Shan DO Previous Signature: 05/13/2022 3:01:16 PM Version By: Kalman Shan DO Entered By: Kalman Shan on 05/13/2022  15:13:23 -------------------------------------------------------------------------------- Problem List Details Patient Name: Date of Service: [name] 05/13/2022 1:00 PM Medical Record Number: [id number] Patient Account Number: [id number] Date of Birth/Sex: Treating RN: [birth year] (41 y.o. Male) Rosalio Loud Primary Care Provider: Karle Plumber Other Clinician: Referring Provider: Treating Provider/Extender: Sammuel Bailiff in Treatment: 0 Active Problems ICD-10 Encounter Code Description Active Date MDM Diagnosis E11.621 Type 2 diabetes mellitus with foot ulcer 05/13/2022 No Yes L97.522 Non-pressure chronic ulcer of other part of left foot with fat layer exposed 05/13/2022 No Yes L97.512 Non-pressure chronic ulcer of other part of right foot with fat layer exposed 05/13/2022 No Yes I87.2 Venous insufficiency (chronic) (peripheral) 05/13/2022 No Yes Z89.412 Acquired absence of left great toe 05/13/2022 No Yes Z89.411 Acquired absence of right great toe 05/13/2022 No Yes Inactive Problems Resolved Problems [name], [name] ([id number]) [id number] Page 8 of 13 Electronic Signature(s) Signed: 05/13/2022 4:54:10 PM By: Kalman Shan DO Entered By: Kalman Shan on 05/13/2022 14:51:12 -------------------------------------------------------------------------------- Progress Note Details Patient Name: Date of Service: [name] 05/13/2022 1:00 PM Medical Record Number: [id number] Patient Account Number: [id number] Date of Birth/Sex: Treating RN: 06/05/1980 (41 y.o. Male) Rosalio Loud Primary Care Provider: Karle Plumber Other Clinician: Referring Provider: Treating Provider/Extender: Sammuel Bailiff in Treatment: 0 Subjective Chief Complaint Information obtained from Patient 05/13/2022; bilateral feet wounds History of Present Illness (HPI) 05/13/2022 [name] is a 41 year old male with uncontrolled insulin-dependent type 2 diabetes with last hemoglobin A1c of 11.1, previous feet wounds that led to bilateral great toe amputations, ADHD and bipolar depression that presents the clinic for a 1 month history of non  healing ulcers to the Feet bilaterally. His previous bilateral great toe amputation sites have healed. He has been using an antibacterial spray to keep the areas clean. He has not been dressing the wounds. He states that over the past couple days he has had increased redness and warmth to the right foot. He has regular tennis shoes. He does not use any offloading device. He states he walks around in socks and does not go barefoot. Patient History Allergies lactose (Severity: Moderate) Social History Never smoker, Marital Status - Single, Alcohol Use - Never, Drug Use - No History, Caffeine Use - Rarely. Medical History Cardiovascular Patient has history of Hypertension, Peripheral Venous Disease Endocrine Patient has history of Type II Diabetes Musculoskeletal Patient has history of Osteomyelitis Patient is treated with Insulin, Oral Agents. Blood sugar is not tested. Review of Systems (ROS) Constitutional Symptoms (General Health) Denies complaints or symptoms of Fatigue, Fever, Chills, Marked Weight Change. Eyes Denies complaints or symptoms of Dry Eyes, Vision Changes, Glasses / Contacts. Ear/Nose/Mouth/Throat Denies complaints or symptoms of Difficult clearing ears, Sinusitis. Hematologic/Lymphatic Denies complaints or symptoms of Bleeding / Clotting Disorders, Human Immunodeficiency Virus. Respiratory Denies complaints or symptoms of Chronic or frequent coughs, Shortness of Breath. Gastrointestinal Denies complaints or symptoms of Frequent diarrhea, Nausea, Vomiting. Integumentary (Skin) Complains or has symptoms of Wounds. [name], [name] ([id number]) [id number] Page 9 of  13 Objective Constitutional Vitals Time Taken: 1:16 PM, Height: 73 in, Source: Stated, Weight: 333 lbs, Source: Stated, BMI: 43.9, Temperature: 98.1 F, Pulse: 108 bpm, Respiratory Rate: 16 breaths/min, Blood Pressure: 136/85 mmHg. General Notes: Left foot to the medial aspect of the first met head there is an open wound with devitalized tissue and nonviable tissue. This undermines. Once debrided there is healthy granulation tissue present. Increased warmth and erythema to the periwound. Right foot: T the plantar aspect there are multiple open o wounds. No undermining noted to these wounds. Granulation tissue present although not the healthiest appearance. Integumentary (Hair, Skin) Wound #1 status is Open. Original cause of wound was Gradually Appeared. The date acquired was: 05/12/2022. The wound is located on the Right,Lateral Foot. The wound measures 0.3cm length x 0.6cm width x 0.1cm depth; 0.141cm^2 area and 0.014cm^3 volume. There is no tunneling noted, however, there is undermining starting at 3:00 and ending at 5:00 with a maximum distance of 2cm. There is a medium amount of serosanguineous drainage noted. There is small (1-33%) red, pink granulation within the wound bed. There is no necrotic tissue within the wound bed. Wound #2 status is Open. Original cause of wound was Gradually Appeared. The date acquired was: 02/23/2022. The wound is located on the Left,Medial Foot. The wound measures 1cm length x 0.5cm width x 0.1cm depth; 0.393cm^2 area and 0.039cm^3 volume. There is no tunneling or undermining noted. There is a medium amount of serosanguineous drainage noted. There is small (1-33%) red, pink granulation within the wound bed. There is a small (1-33%) amount of necrotic tissue within the wound bed including Adherent Slough. Wound #3 status is Open. Original cause of wound was Gradually Appeared. The date acquired was: 02/23/2022. The wound is located on the [place]. The  wound measures 3cm length x 2.5cm width x 0.2cm depth; 5.89cm^2 area and 1.178cm^3 volume. There is no tunneling or undermining noted. There is a medium amount of serosanguineous drainage noted. There is small (1-33%) red, pink granulation within the wound bed. There is a small (1-33%) amount of necrotic tissue within the wound bed including Adherent  Slough. Wound #5 status is Open. Original cause of wound was Gradually Appeared. The date acquired was: 02/16/2022. The wound is located on the Left,Proximal,Posterior T Second. The wound measures 2.5cm length x 1.5cm width x 0.1cm depth; 2.945cm^2 area and 0.295cm^3 volume. There is a oe medium amount of serosanguineous drainage noted. There is small (1-33%) red, pink granulation within the wound bed. There is a small (1-33%) amount of necrotic tissue within the wound bed including Adherent Slough. Assessment Active Problems ICD-10 Type 2 diabetes mellitus with foot ulcer Non-pressure chronic ulcer of other part of left foot with fat layer exposed Non-pressure chronic ulcer of other part of right foot with fat layer exposed Venous insufficiency (chronic) (peripheral) Acquired absence of left great toe Acquired absence of right great toe Patient presents with a 1 month history of nonhealing ulcers to his feet bilaterally in the setting of uncontrolled type 2 diabetes. He has neuropathy. We discussed the importance of glucose control for wound healing. We discussed how he is at high risk for infection and thus amputation. He states he will follow-up with his PCP to obtain better glucose control. We also discussed the importance of aggressive offloading for his wound healing. We discussed the total contact cast and he would like to try this. We will start this next week For the left leg. For now I recommended a peg assist with surgical shoe for both feet. I recommended he do daily dressing changes with Hydrofera Blue and Medihoney. I recommended he not  walk barefoot and keep the area covered at all times. He has cellulitis in the right foot. I prescribed him oral antibiotics today. Follow-up in 1 week. Procedures Wound #1 Pre-procedure diagnosis of Wound #1 is an Atypical located on the Right,Lateral Foot . There was a Excisional Skin/Subcutaneous Tissue Debridement with a total area of 0.18 sq cm performed by Kalman Shan, MD. With the following instrument(s): Curette, Forceps, and Scissors to remove Viable and Non- Viable tissue/material. Material removed includes Callus, Subcutaneous Tissue, Slough, Skin: Dermis, and Skin: Epidermis after achieving pain control using Lidocaine 4% T opical Solution. No specimens were taken. A time out was conducted at 14:26, prior to the start of the procedure. A Minimum amount of bleeding was controlled with Pressure. The procedure was tolerated well. Post Debridement Measurements: 1cm length x 2.5cm width x 0.2cm depth; 0.393cm^3 volume. Character of Wound/Ulcer Post Debridement is stable. Post procedure Diagnosis Wound #1: Same as Pre-Procedure Wound #2 Pre-procedure diagnosis of Wound #2 is an Atypical located on the Left,Medial Foot . There was a Excisional Skin/Subcutaneous Tissue Debridement with a total area of 0.5 sq cm performed by Kalman Shan, MD. With the following instrument(s): Curette to remove Viable and Non-Viable tissue/material. Material removed includes Subcutaneous Tissue, Slough, and Skin: Dermis after achieving pain control using Lidocaine 4% T opical Solution. No specimens were taken. A time out was conducted at 14:32, prior to the start of the procedure. A Minimum amount of bleeding was controlled with Pressure. The procedure was tolerated well. Post Debridement Measurements: 1cm length x 0.5cm width x 0.2cm depth; 0.079cm^3 volume. Character of Wound/Ulcer Post Debridement is stable. Post procedure Diagnosis Wound #2: Same as Pre-Procedure Wound #3 Pre-procedure diagnosis  of Wound #3 is an Atypical located on the [place] . There was a Excisional Skin/Subcutaneous Tissue Debridement with a total area of 7.5 sq cm performed by Kalman Shan, MD. With the following instrument(s): Curette to remove Viable and Non-Viable tissue/material. Material removed includes Callus, Subcutaneous Tissue,  Slough, Skin: Dermis, and Skin: Epidermis after achieving pain control using Lidocaine 4% Topical [name], [name] ([id number]) [phone].pdf Page 10 of 13 Solution. No specimens were taken. A time out was conducted at 14:26, prior to the start of the procedure. A Minimum amount of bleeding was controlled with Pressure. The procedure was tolerated well. Post Debridement Measurements: 3cm length x 2.5cm width x 0.2cm depth; 1.178cm^3 volume. Character of Wound/Ulcer Post Debridement is stable. Post procedure Diagnosis Wound #3: Same as Pre-Procedure Wound #5 Pre-procedure diagnosis of Wound #5 is an Atypical located on the Left,Proximal,Posterior Foot . There was a Excisional Skin/Subcutaneous Tissue Debridement with a total area of 4.12 sq cm performed by Kalman Shan, MD. With the following instrument(s): Curette, Forceps, and Scissors to remove Viable and Non-Viable tissue/material. Material removed includes Callus, Subcutaneous Tissue, Slough, Skin: Dermis, and Skin: Epidermis after achieving pain control using Lidocaine 4% Topical Solution. No specimens were taken. A time out was conducted at 14:26, prior to the start of the procedure. A Minimum amount of bleeding was controlled with Pressure. The procedure was tolerated well. Post Debridement Measurements: 2.5cm length x 1.5cm width x 0.2cm depth; 0.589cm^3 volume. Character of Wound/Ulcer Post Debridement is stable. Post procedure Diagnosis Wound #5: Same as Pre-Procedure Plan Follow-up Appointments: Return Appointment in 1 week. - on Wed then on Friday and following Wed The  following medication(s) was prescribed: doxycycline hyclate oral 100 mg tablet 1 1 tablet oral twice a day x 10 days starting 05/13/2022 amoxicillin-pot clavulanate oral 875 mg-125 mg tablet 1 1 tablet oral twice a day x 10 days starting 05/13/2022 WOUND #1: - Foot Wound Laterality: Right, Lateral Cleanser: Byram Ancillary Kit - 15 Day Supply (DME) (Generic) 1 x Per Day/30 Days Discharge Instructions: Use supplies as instructed; Kit contains: (15) Saline Bullets; (15) 3x3 Gauze; 15 pr Gloves Cleanser: Soap and Water 1 x Per Day/30 Days Discharge Instructions: Gently cleanse wound with antibacterial soap, rinse and pat dry prior to dressing wounds Topical: Activon Honey Gel, 25 (g) Tube 1 x Per Day/30 Days Prim Dressing: Hydrofera Blue Ready Transfer Foam, 4x5 (in/in) (DME) (Generic) 1 x Per Day/30 Days ary Discharge Instructions: Apply Hydrofera Blue Ready to wound bed as directed Secondary Dressing: ABD Pad 5x9 (in/in) 1 x Per Day/30 Days Discharge Instructions: Cover with ABD pad Secured With: Kerlix Roll Sterile or Non-Sterile 6-ply 4.5x4 (yd/yd) 1 x Per Day/30 Days Discharge Instructions: Apply Kerlix as directed WOUND #2: - Foot Wound Laterality: Left, Medial Cleanser: Byram Ancillary Kit - 15 Day Supply (DME) (Generic) 1 x Per Day/30 Days Discharge Instructions: Use supplies as instructed; Kit contains: (15) Saline Bullets; (15) 3x3 Gauze; 15 pr Gloves Cleanser: Soap and Water 1 x Per Day/30 Days Discharge Instructions: Gently cleanse wound with antibacterial soap, rinse and pat dry prior to dressing wounds Topical: Activon Honey Gel, 25 (g) Tube 1 x Per Day/30 Days Prim Dressing: Hydrofera Blue Ready Transfer Foam, 4x5 (in/in) (DME) (Generic) 1 x Per Day/30 Days ary Discharge Instructions: Apply Hydrofera Blue Ready to wound bed as directed Secondary Dressing: ABD Pad 5x9 (in/in) 1 x Per Day/30 Days Discharge Instructions: Cover with ABD pad Secured With: Kerlix Roll Sterile or  Non-Sterile 6-ply 4.5x4 (yd/yd) 1 x Per Day/30 Days Discharge Instructions: Apply Kerlix as directed WOUND #3: - Foot Wound Laterality: Left, Posterior Cleanser: Byram Ancillary Kit - 15 Day Supply (DME) (Generic) 1 x Per Day/30 Days Discharge Instructions: Use supplies as instructed; Kit contains: (15) Saline Bullets; (15) 3x3 Gauze; 15  pr Gloves Cleanser: Soap and Water 1 x Per Day/30 Days Discharge Instructions: Gently cleanse wound with antibacterial soap, rinse and pat dry prior to dressing wounds Topical: Activon Honey Gel, 25 (g) Tube 1 x Per Day/30 Days Prim Dressing: Hydrofera Blue Ready Transfer Foam, 4x5 (in/in) (DME) (Generic) 1 x Per Day/30 Days ary Discharge Instructions: Apply Hydrofera Blue Ready to wound bed as directed Secondary Dressing: ABD Pad 5x9 (in/in) 1 x Per Day/30 Days Discharge Instructions: Cover with ABD pad Secured With: Kerlix Roll Sterile or Non-Sterile 6-ply 4.5x4 (yd/yd) 1 x Per Day/30 Days Discharge Instructions: Apply Kerlix as directed WOUND #5: - T Second Wound Laterality: Left, Posterior, Proximal oe Cleanser: Byram Ancillary Kit - 15 Day Supply (DME) (Generic) 1 x Per Day/30 Days Discharge Instructions: Use supplies as instructed; Kit contains: (15) Saline Bullets; (15) 3x3 Gauze; 15 pr Gloves Cleanser: Soap and Water 1 x Per Day/30 Days Discharge Instructions: Gently cleanse wound with antibacterial soap, rinse and pat dry prior to dressing wounds Topical: Activon Honey Gel, 25 (g) Tube 1 x Per Day/30 Days Prim Dressing: Hydrofera Blue Ready Transfer Foam, 4x5 (in/in) (DME) (Generic) 1 x Per Day/30 Days ary Discharge Instructions: Apply Hydrofera Blue Ready to wound bed as directed Secondary Dressing: ABD Pad 5x9 (in/in) 1 x Per Day/30 Days Discharge Instructions: Cover with ABD pad Secured With: Kerlix Roll Sterile or Non-Sterile 6-ply 4.5x4 (yd/yd) 1 x Per Day/30 Days Discharge Instructions: Apply Kerlix as directed 1. In office sharp  debridement 2. Medihoney and Hydrofera Blue 3. Aggressive offloadingoopeg assist with surgical shoe to the feet bilaterally 5. Plan for TCC next week 6. Augmentin and doxycycline 7. Follow-up in 1 week Electronic Signature(s) Signed: 05/13/2022 4:54:10 PM By: Thornell Sartorius ([id number]) By: Kalman Shan DO [id number] Page 11 of 13 Signed: 05/13/2022 4:54:10 PM Entered By: Kalman Shan on 05/13/2022 15:12:37 -------------------------------------------------------------------------------- ROS/PFSH Details Patient Name: Date of Service: RAYMIR, FROMMELT 05/13/2022 1:00 PM Medical Record Number: [id number] Patient Account Number: [id number] Date of Birth/Sex: Treating RN: 02-Feb-1981 (41 y.o. Male) Rosalio Loud Primary Care Provider: Karle Plumber Other Clinician: Referring Provider: Treating Provider/Extender: Sammuel Bailiff in Treatment: 0 Constitutional Symptoms (General Health) Complaints and Symptoms: Negative for: Fatigue; Fever; Chills; Marked Weight Change Eyes Complaints and Symptoms: Negative for: Dry Eyes; Vision Changes; Glasses / Contacts Ear/Nose/Mouth/Throat Complaints and Symptoms: Negative for: Difficult clearing ears; Sinusitis Hematologic/Lymphatic Complaints and Symptoms: Negative for: Bleeding / Clotting Disorders; Human Immunodeficiency Virus Respiratory Complaints and Symptoms: Negative for: Chronic or frequent coughs; Shortness of Breath Gastrointestinal Complaints and Symptoms: Negative for: Frequent diarrhea; Nausea; Vomiting Integumentary (Skin) Complaints and Symptoms: Positive for: Wounds Cardiovascular Medical History: Positive for: Hypertension; Peripheral Venous Disease Endocrine Medical History: Positive for: Type II Diabetes Time with diabetes: 2000 Treated with: Insulin, Oral agents Blood sugar tested every day: No Musculoskeletal Medical  History: Positive for: Osteomyelitis NIKOLAUS, PIENTA ([id number]) [phone].pdf Page 12 of 13 Immunizations Pneumococcal Vaccine: Received Pneumococcal Vaccination: No Implantable Devices No devices added Family and Social History Never smoker; Marital Status - Single; Alcohol Use: Never; Drug Use: No History; Caffeine Use: Rarely Electronic Signature(s) Signed: 05/13/2022 4:57:18 PM By: Rosalio Loud MSN RN CNS WTA Signed: 05/14/2022 2:00:40 PM By: Kalman Shan DO Previous Signature: 05/13/2022 4:54:10 PM Version By: Kalman Shan DO Entered By: Rosalio Loud on 05/13/2022 16:55:45 -------------------------------------------------------------------------------- SuperBill Details Patient Name: Date of Service: [name] 05/13/2022 Medical Record Number: [id number] Patient Account Number: [id number] Date of Birth/Sex: Treating RN: April 07, 1981 (  41 y.o. 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 Facility Procedures : CPT4 Code: [id number] Description: 11042 - DEB SUBQ TISSUE 20 SQ CM/< ICD-10 Diagnosis Description L97.512 Non-pressure chronic ulcer of other part of right foot with fat layer exposed E11.621 Type 2 diabetes mellitus with foot ulcer Modifier: Quantity: 1 : CPT4 Code: [id number] Description: 42320 - DEBRIDE WOUND 1ST 20 SQ CM OR < ICD-10 Diagnosis Description L97.522 Non-pressure chronic ulcer  of other part of left foot with fat layer exposed Modifier: Quantity: 1 Physician Procedures : CPT4 Code Description Modifier [id number] 99579 - WC PHYS LEVEL 4 - NEW PT ICD-10 Diagnosis Description E11.621 Type 2 diabetes mellitus with foot ulcer L97.522 Non-pressure chronic ulcer of other part of left foot with fat layer exposed RODELL, MARRS [name] ([id number]) [id number] L97.512 Non-pressure chronic ulcer of other part of right foot with fat layer exposed I87.2 Venous insufficiency (chronic) (peripheral) Quantity: 1 17.pdf Page 13 of 13 : [id number] 11042 - WC PHYS SUBQ TISS 20 SQ CM 1 ICD-10 Diagnosis Description L97.512 Non-pressure chronic ulcer of other part of right foot with fat layer exposed E11.621 Type 2 diabetes mellitus with foot ulcer Quantity: : [id number] 05056 - WC PHYS DEBR WO ANESTH 20 SQ CM 1 ICD-10 Diagnosis Description L97.522 Non-pressure chronic ulcer of other part of left foot with fat layer exposed Quantity: Electronic Signature(s) Signed: 05/13/2022 4:48:43 PM By: Rosalio Loud MSN RN CNS WTA Signed: 05/13/2022 4:54:10 PM By: Kalman Shan DO Entered By: Rosalio Loud on 05/13/2022 16:48:42

## 2022-05-14 NOTE — Progress Notes (Signed)
WYETH, HOFFER ([id number]) [phone] [id number].pdf Page 1 of 5 Visit Report for 05/13/2022 Abuse Risk Screen Details Patient Name: Date of Service: [name], [name] 05/13/2022 1:00 PM Medical Record Number: [id number] Patient Account Number: [id number] Date of Birth/Sex: Treating RN: [birth year] (41 y.o. [name] Primary Care Ladanian Kelter: Christoper Fabian RA H Other Clinician: Referring Nora Rooke: Treating Roann Merk/Extender: Yevonne Aline, DEBO RA H Weeks in Treatment: 0 Abuse Risk Screen Items Answer Electronic Signature(s) Signed: 05/13/2022 4:55:49 PM By: [name] Entered By: Midge Aver on 05/13/2022 16:55:49 -------------------------------------------------------------------------------- Activities of Daily Living Details Patient Name: Date of Service: [name], [name] 05/13/2022 1:00 PM Medical Record Number: [id number] Patient Account Number: [id number] Date of Birth/Sex: Treating RN: 01-27-1981 (41 y.o. [name] Primary Care Aubree Doody: Christoper Fabian RA H Other Clinician: Referring Valery Chance: Treating Kabe Mckoy/Extender: Yevonne Aline, DEBO RA H Weeks in Treatment: 0 Activities of Daily Living Items Answer Activities of Daily Living (Please select one for each item) Drive Automobile Completely Able T Medications ake Completely Able Use T elephone Completely Able Care for Appearance Completely Able Use T oilet Completely Able Bath / Shower Completely Able Dress Self Completely Able Feed Self Completely Able Walk Completely Able Get In / Out Bed Completely Able Housework Completely Able Prepare Meals Completely Able Handle Money Completely Able Shop for Self Completely KUBA, SHEPHERD ([id number]) [phone] [id number].pdf Page 2 of 5 Electronic Signature(s) Signed: 05/13/2022 4:55:53 PM By: [name] Entered By: Midge Aver on  05/13/2022 16:55:52 -------------------------------------------------------------------------------- Education Screening Details Patient Name: Date of Service: [name], [name] 05/13/2022 1:00 PM Medical Record Number: [id number] Patient Account Number: [id number] Date of Birth/Sex: Treating RN: 06-09-1980 (41 y.o. [name] Primary Care Tadeo Besecker: Christoper Fabian RA H Other Clinician: Referring Kieley Akter: Treating Anagabriela Jokerst/Extender: Yevonne Aline, DEBO RA H Weeks in Treatment: 0 Learning Preferences/Education Level/Primary Language Learning Preference: Explanation, Demonstration Highest Education Level: High School Preferred Language: English Cognitive Barrier Language Barrier: No Translator Needed: No Memory Deficit: No Emotional Barrier: No Cultural/Religious Beliefs Affecting Medical Care: No Physical Barrier Impaired Vision: No Impaired Hearing: No Decreased Hand dexterity: No Knowledge/Comprehension Knowledge Level: High Comprehension Level: High Ability to understand written instructions: High Ability to understand verbal instructions: High Motivation Anxiety Level: Calm Cooperation: Cooperative Education Importance: Acknowledges Need Interest in Health Problems: Asks Questions Perception: Coherent Willingness to Engage in Self-Management High Activities: Readiness to Engage in Self-Management High Activities: Electronic Signature(s) Signed: 05/13/2022 4:55:57 PM By: [name] Entered By: Midge Aver on 05/13/2022 16:55:57 [name] ([id number]) [id number] Page 3 of 5 -------------------------------------------------------------------------------- Fall Risk Assessment Details Patient Name: Date of Service: [name], [name] 05/13/2022 1:00 PM Medical Record Number: [id number] Patient Account Number: [id number] Date of Birth/Sex: Treating RN: 05/19/1981 (41 y.o. [name] Primary Care Mekai Wilkinson: Christoper Fabian RA H Other Clinician: Referring Annabell Oconnor: Treating Dewey Viens/Extender: Yevonne Aline, DEBO RA H Weeks in Treatment: 0 Fall Risk Assessment Items Have you had 2 or more falls in the last 12 monthso 0 No Have you had any fall that resulted in injury in the last 12 monthso 0 No FALLS RISK SCREEN History of falling - immediate or within 3 months 0 No Secondary diagnosis (Do you have 2 or more medical diagnoseso) 0 No Ambulatory aid None/bed rest/wheelchair/nurse 0 No Crutches/cane/walker 0 No Furniture 0 No Intravenous therapy  Access/Saline/Heparin Lock 0 No Gait/Transferring Normal/ bed rest/ wheelchair 0 No Weak (short steps with or without shuffle, stooped but able to lift head while walking, may seek 0 No support from furniture) Impaired (short steps with shuffle, may have difficulty arising from chair, head down, impaired 0 No balance) Mental Status Oriented to own ability 0 Yes Electronic Signature(s) Signed: 05/13/2022 4:56:01 PM By: [name] Entered By: Midge Aver on 05/13/2022 16:56:01 -------------------------------------------------------------------------------- Foot Assessment Details Patient Name: Date of Service: [name] 05/13/2022 1:00 PM Medical Record Number: [id number] Patient Account Number: [id number] Date of Birth/Sex: Treating RN: March 27, 1981 (41 y.o. [name] Primary Care Vada Yellen: Christoper Fabian RA H Other Clinician: Referring Torin Whisner: Treating Bertina Guthridge/Extender: Yevonne Aline, DEBO RA H Weeks in Treatment: 0 Foot Assessment Items Site Locations [place] ([id number]) [phone] [id number] Page 4 of 5 + = Sensation present, - = Sensation absent, C = Callus, U = Ulcer R = Redness, W = Warmth, M = Maceration, PU = Pre-ulcerative lesion F = Fissure, S = Swelling, D = Dryness Assessment Right: Left: Other  Deformity: No No Prior Foot Ulcer: Yes No Prior Amputation: Yes No Charcot Joint: No No Ambulatory Status: Ambulatory Without Help Gait: Steady Electronic Signature(s) Signed: 05/13/2022 4:56:10 PM By: [name] Entered By: Midge Aver on 05/13/2022 16:56:10 -------------------------------------------------------------------------------- Nutrition Risk Screening Details Patient Name: Date of Service: [name], [name] 05/13/2022 1:00 PM Medical Record Number: [id number] Patient Account Number: [id number] Date of Birth/Sex: Treating RN: 1980/06/06 (41 y.o. [name] Primary Care Priyansh Pry: Christoper Fabian RA H Other Clinician: Referring Emiley Digiacomo: Treating Marcellene Shivley/Extender: Yevonne Aline, DEBO RA H Weeks in Treatment: 0 Height (in): 73 Weight (lbs): 333 Body Mass Index (BMI): 43.9 Nutrition Risk Screening Items Score Screening NUTRITION RISK SCREEN: I have an illness or condition that made me change the kind and/or amount of food I eat 0 No I eat fewer than two meals per day 0 No I eat few fruits and vegetables, or milk products 0 No I have three or more drinks of beer, liquor or wine almost every day 0 No I have tooth or mouth problems that make it hard for me to eat 0 No I don't always have enough money to buy the food I need 0 No ORLEN, LEEDY C ([id number]) Marlow Baars [id number] Page 5 of 5 I eat alone most of the time 0 No I take three or more different prescribed or over-the-counter drugs a day 0 No Without wanting to, I have lost or gained 10 pounds in the last six months 0 No I am not always physically able to shop, cook and/or feed myself 0 No Nutrition Protocols Good Risk Protocol 0 No interventions needed Moderate Risk Protocol High Risk Proctocol Risk Level: Good Risk Score: 0 Electronic Signature(s) Signed: 05/13/2022 4:56:05 PM By: [name] Entered By: Midge Aver on  05/13/2022 16:56:05

## 2022-05-20 ENCOUNTER — Encounter: Payer: Self-pay | Admitting: Internal Medicine

## 2022-05-20 NOTE — Progress Notes (Addendum)
PARKE, JANDREAU ([id number]) [id number] Page 1 of 12 Visit Report for 05/20/2022 Arrival Information Details Patient Name: Date of Service: [name], [name] 05/20/2022 9:30 A M Medical Record Number: [id number] Patient Account Number: [id number] Date of Birth/Sex: Treating RN: 1980/06/12 (41 y.o. [name] Primary Care Breckyn Troyer: Karle Plumber Other Clinician: Referring French Kendra: Treating Zira Helinski/Extender: RO BSO Delane Ginger, [name] Oletta Cohn in Treatment: 1 Visit Information History Since Last Visit Added or deleted any medications: No Patient Arrived: Ambulatory Has Dressing in Place as Prescribed: Yes Arrival Time: 09:45 Has Footwear/Offloading in Place as Yes Accompanied By: self Prescribed: Transfer Assistance: None Left: Surgical Shoe with Pressure Relief Insole Patient Identification Verified: Yes Right: Surgical Shoe with Pressure Relief Insole Secondary Verification Process Completed: Yes Pain Present Now: No Patient Requires Transmission-Based Precautions: No Patient Has Alerts: Yes Patient Alerts: Diabetic Type 2 Electronic Signature(s) Signed: 05/21/2022 4:56:13 PM By: Rosalio Loud MSN RN CNS WTA Previous Signature: 05/20/2022 12:29:49 PM Version By: Gretta Cool, BSN, RN, CWS, Kim RN, BSN Entered By: Rosalio Loud on 05/20/2022 15:59:13 -------------------------------------------------------------------------------- Encounter Discharge Information Details Patient Name: Date of Service: [name] 05/20/2022 9:30 A M Medical Record Number: [id number] Patient Account Number: [id number] Date of Birth/Sex: Treating RN: 10-06-1980 (41 y.o. [name] Primary Care Ayaat Jansma: Karle Plumber Other Clinician: Referring Jakiah Goree: Treating Zorina Mallin/Extender: [name], [name] Oletta Cohn in Treatment: 1 Encounter Discharge Information Items Post Procedure Vitals Discharge Condition: Stable Temperature  (F): 97.8 Ambulatory Status: Ambulatory Pulse (bpm): 81 Discharge Destination: Home Respiratory Rate (breaths/min): 16 Transportation: Private Auto Blood Pressure (mmHg): 128/82 Schedule Follow-up Appointment: Yes Clinical Summary of Care: Electronic Signature(s) Signed: 05/20/2022 12:29:49 PM By: Gretta Cool, BSN, RN, CWS, Kim RN, BSN [place], [place] [name] ([id number]) [id number] Page 2 of 12 Entered By: Gretta Cool, BSN, RN, CWS, Kim on 05/20/2022 10:36:56 -------------------------------------------------------------------------------- Lower Extremity Assessment Details Patient Name: Date of Service: [name], [name] 05/20/2022 9:30 A M Medical Record Number: [id number] Patient Account Number: [id number] Date of Birth/Sex: Treating RN: 01/16/1981 (41 y.o. [name] Primary Care Rodert Hinch: Karle Plumber Other Clinician: Referring Jaymie Misch: Treating Uchenna Seufert/Extender: RO BSO Delane Ginger, [name] Oletta Cohn in Treatment: 1 Edema Assessment Assessed: [Left: Yes] [Right: Yes] [Left: Edema] [Right: :] Vascular Assessment Pulses: Dorsalis Pedis Palpable: [Left:Yes] [Right:Yes] Electronic Signature(s) Signed: 05/20/2022 12:29:49 PM By: Gretta Cool, BSN, RN, CWS, Kim RN, BSN Entered By: Gretta Cool, BSN, RN, CWS, Kim on 05/20/2022 10:04:32 -------------------------------------------------------------------------------- Multi Wound Chart Details Patient Name: Date of Service: [name] 05/20/2022 9:30 A M Medical Record Number: [id number] Patient Account Number: [id number] Date of Birth/Sex: Treating RN: September 10, 1980 (41 y.o. [name] Primary Care Dorman Calderwood: Karle Plumber Other Clinician: Referring Mescal Flinchbaugh: Treating Niva Murren/Extender: [name], [name] Debbora Dus, [name] in Treatment: 1 Vital Signs Height(in): 73 Pulse(bpm): 81 Weight(lbs): 333 Blood Pressure(mmHg): 128/82 Body Mass Index(BMI): 43.9 Temperature(F): 97.8 Respiratory  Rate(breaths/min): 18 [1:Photos:] [3:[id number] Page 3 of 12] Right, Lateral Foot Left, Medial Foot Left, Posterior Foot Wound Location: Gradually Appeared Gradually Appeared Gradually Appeared Wounding Event: Diabetic Wound/Ulcer of the Lower Diabetic Wound/Ulcer of the Lower Diabetic Wound/Ulcer of the Lower Primary Etiology: Extremity Extremity Extremity Hypertension, Peripheral Venous Hypertension, Peripheral Venous Hypertension, Peripheral Venous Comorbid History: Disease, Type II Diabetes, Disease, Type II Diabetes, Disease, Type II Diabetes, Osteomyelitis Osteomyelitis Osteomyelitis 05/12/2022 02/23/2022 02/23/2022 Date Acquired: '1 1 1 ' Weeks of Treatment: Open Open Open Wound Status: No No No Wound Recurrence: No No No Clustered Wound: Yes Yes Yes Pending  A mputation on Presentation: 0.3x0.5x0.1 1.5x1.5x0.2 1.7x1.5x0.2 Measurements L x W x D (cm) 0.118 1.767 2.003 A (cm) : rea 0.012 0.353 0.401 Volume (cm) : 16.30% -349.60% 66.00% % Reduction in A rea: 14.30% -805.10% 66.00% % Reduction in Volume: 2 Starting Position 1 (o'clock): 9 Ending Position 1 (o'clock): 0.7 Maximum Distance 1 (cm): No Yes N/A Undermining: Grade 1 Grade 1 Grade 1 Classification: Medium Medium Medium Exudate A mount: Serosanguineous Serosanguineous Serosanguineous Exudate Type: red, brown red, brown red, brown Exudate Color: N/A Flat and Intact N/A Wound Margin: Medium (34-66%) Small (1-33%) Small (1-33%) Granulation A mount: Red, Pink Red, Pink Red, Pink Granulation Quality: Medium (34-66%) Small (1-33%) Small (1-33%) Necrotic A mount: Fat Layer (Subcutaneous Tissue): Yes Fat Layer (Subcutaneous Tissue): Yes Fat Layer (Subcutaneous Tissue): Yes Exposed Structures: Fascia: No Fascia: No Fascia: No Tendon: No Tendon: No Tendon: No Muscle: No Muscle: No Muscle: No Joint: No Joint: No Joint: No Bone: No Bone: No Bone: No None Small  (1-33%) Small (1-33%) Epithelialization: N/A Debridement - Excisional Debridement - Excisional Debridement: Pre-procedure Verification/Time Out N/A 10:12 10:12 Taken: N/A Callus, Subcutaneous Callus, Subcutaneous Tissue Debrided: N/A Skin/Subcutaneous Tissue Skin/Subcutaneous Tissue Level: N/A 2.25 2.55 Debridement A (sq cm): rea N/A Curette Curette Instrument: N/A Minimum Minimum Bleeding: N/A Pressure Pressure Hemostasis A chieved: N/A Procedure was tolerated well Procedure was tolerated well Debridement Treatment Response: N/A 1.5x1.5x0.3 1.7x1.5x0.3 Post Debridement Measurements L x W x D (cm) N/A 0.53 0.601 Post Debridement Volume: (cm) N/A Debridement Debridement Procedures Performed: Wound Number: 5 N/A N/A Photos: N/A N/A Left, Proximal, Posterior T Second oe N/A N/A Wound Location: Gradually Appeared N/A N/A Wounding Event: Diabetic Wound/Ulcer of the Lower N/A N/A Primary Etiology: Extremity Hypertension, Peripheral Venous N/A N/A Comorbid History: Disease, Type II Diabetes, Osteomyelitis 02/16/2022 N/A N/A Date Acquired: 1 N/A N/A Weeks of Treatment: Healed - Epithelialized N/A N/A Wound Status: No N/A N/A Wound Recurrence: Yes N/A N/A Clustered Wound: Yes N/A N/A Pending A mputation on PresentationKIYOSHI, SCHAAB ([id number]) [phone].pdf Page 4 of 12 0x0x0 N/A N/A Measurements L x W x D (cm) 0 N/A N/A A (cm) : rea 0 N/A N/A Volume (cm) : 100.00% N/A N/A % Reduction in A rea: 100.00% N/A N/A % Reduction in Volume: N/A N/A N/A Undermining: Grade 1 N/A N/A Classification: Medium N/A N/A Exudate A mount: Serosanguineous N/A N/A Exudate Type: red, brown N/A N/A Exudate Color: N/A N/A N/A Wound Margin: Small (1-33%) N/A N/A Granulation A mount: Red, Pink N/A N/A Granulation Quality: Small (1-33%) N/A N/A Necrotic A mount: Fascia: No N/A N/A Exposed Structures: Fat Layer (Subcutaneous  Tissue): No Tendon: No Muscle: No Joint: No Bone: No N/A N/A N/A Epithelialization: N/A N/A N/A Debridement: N/A N/A N/A Tissue Debrided: N/A N/A N/A Level: N/A N/A N/A Debridement A (sq cm): rea N/A N/A N/A Instrument: N/A N/A N/A Bleeding: N/A N/A N/A Hemostasis A chieved: Debridement Treatment Response: N/A N/A N/A Post Debridement Measurements L x N/A N/A N/A W x D (cm) N/A N/A N/A Post Debridement Volume: (cm) N/A N/A N/A Procedures Performed: Treatment Notes Wound #1 (Foot) Wound Laterality: Right, Lateral Cleanser Byram Ancillary Kit - 15 Day Supply Discharge Instruction: Use supplies as instructed; Kit contains: (15) Saline Bullets; (15) 3x3 Gauze; 15 pr Gloves Soap and Water Discharge Instruction: Gently cleanse wound with antibacterial soap, rinse and pat dry prior to dressing wounds Peri-Wound Care Topical Primary Dressing Silvercel Small 2x2 (in/in) Discharge Instruction: Apply Silvercel Small 2x2 (in/in) as instructed  Secondary Dressing T Adhesive [place] Dressing, 4x4 (in/in) elfa Discharge Instruction: Apply over dressing to secure in place. Secured With Compression Wrap Compression Stockings Add-Ons Wound #2 (Foot) Wound Laterality: Left, Medial Cleanser Byram Ancillary Kit - 15 Day Supply Discharge Instruction: Use supplies as instructed; Kit contains: (15) Saline Bullets; (15) 3x3 Gauze; 15 pr Gloves Soap and Water Discharge Instruction: Gently cleanse wound with antibacterial soap, rinse and pat dry prior to dressing wounds Peri-Wound Care Topical Primary Dressing Silvercel Small 2x2 (in/in) Discharge Instruction: Apply Silvercel Small 2x2 (in/in) as instructed [name], [name] ([id number]) [phone].pdf Page 5 of 12 Secondary Dressing T Adhesive [employer], 4x4 (in/in) elfa Discharge Instruction: Apply over dressing to secure in place. Secured With Compression Wrap Compression  Stockings Add-Ons Wound #3 (Foot) Wound Laterality: Left, Posterior Cleanser Byram Ancillary Kit - 15 Day Supply Discharge Instruction: Use supplies as instructed; Kit contains: (15) Saline Bullets; (15) 3x3 Gauze; 15 pr Gloves Soap and Water Discharge Instruction: Gently cleanse wound with antibacterial soap, rinse and pat dry prior to dressing wounds Peri-Wound Care Topical Primary Dressing Silvercel Small 2x2 (in/in) Discharge Instruction: Apply Silvercel Small 2x2 (in/in) as instructed Secondary Dressing T Adhesive [place] Dressing, 4x4 (in/in) elfa Discharge Instruction: Apply over dressing to secure in place. Secured With Compression Wrap Compression Stockings Add-Ons Wound #5 (Toe Second) Wound Laterality: Left, Posterior, Proximal Cleanser Peri-Wound Care Topical Primary Dressing Secondary Dressing Secured With Compression Wrap Compression Stockings Add-Ons Electronic Signature(s) Signed: 05/21/2022 4:56:13 PM By: Rosalio Loud MSN RN CNS WTA Previous Signature: 05/20/2022 12:29:49 PM Version By: Gretta Cool, BSN, RN, CWS, Kim RN, BSN Entered By: Rosalio Loud on 05/20/2022 16:03:27 [name] ([id number]) [id number] Page 6 of 12 -------------------------------------------------------------------------------- Multi-Disciplinary Care Plan Details Patient Name: Date of Service: [name], [name] 05/20/2022 9:30 A M Medical Record Number: [id number] Patient Account Number: [id number] Date of Birth/Sex: Treating RN: 1981-03-21 (41 y.o. [name] Primary Care Othar Curto: Karle Plumber Other Clinician: Referring Amedio Bowlby: Treating Hutchinson Isenberg/Extender: RO BSO Delane Ginger, [name] Oletta Cohn in Treatment: 1 Active Inactive Electronic Signature(s) Signed: 06/24/2022 8:07:59 PM By: Gretta Cool, BSN, RN, CWS, Kim RN, BSN Previous Signature: 05/20/2022 12:29:49 PM Version By: Gretta Cool, BSN, RN, CWS, Kim RN, BSN Entered By: Gretta Cool, BSN, RN,  CWS, Kim on 06/24/2022 20:07:58 -------------------------------------------------------------------------------- Pain Assessment Details Patient Name: Date of Service: [name], [name] 05/20/2022 9:30 A M Medical Record Number: [id number] Patient Account Number: [id number] Date of Birth/Sex: Treating RN: Jun 23, 1980 (41 y.o. [name] Primary Care Debra Colon: Karle Plumber Other Clinician: Referring Nylah Butkus: Treating Neita Landrigan/Extender: [name], [name] Debbora Dus, [name] in Treatment: 1 Active Problems Location of Pain Severity and Description of Pain Patient Has Paino No Site Locations Pain Management and Medication Current Pain Management: Notes Patient denies pain at this time. Electronic Signature(s) Signed: 05/20/2022 5:37:08 PM By: Gretta Cool, BSN, RN, CWS, Kim RN, BSN Signed: 05/21/2022 4:56:13 PM By: Rosalio Loud MSN RN CNS WTA Previous Signature: 05/20/2022 12:29:49 PM Version By: Gretta Cool BSN, RN, CWS, Kim RN, BSN [place], [place] [name] ([id number]) [id number] Page 7 of 12 Previous Signature: 05/20/2022 12:29:49 PM Version By: Gretta Cool, BSN, RN, CWS, Kim RN, BSN Entered By: Rosalio Loud on 05/20/2022 15:59:36 -------------------------------------------------------------------------------- Patient/Caregiver Education Details Patient Name: Date of Service: [name] 12/27/2023andnbsp9:30 A M Medical Record Number: [id number] Patient Account Number: [id number] Date of Birth/Gender: Treating RN: September 07, 1980 (41 y.o. [name] Primary Care Physician: Karle Plumber Other Clinician: Referring Physician: Treating Physician/Extender: RO BSO Delane Ginger, [name] Darrold Span  Weeks in Treatment: 1 Education Assessment Education Provided To: Patient Education Topics Provided Elevated Blood Sugar/ Impact on Healing: Handouts: Elevated Blood Sugars: How Do They Affect Wound Healing Methods: Explain/Verbal Responses: State content  correctly Offloading: Handouts: How Offloading Helps Foot Wounds Heal, Other: wear offloading shoes at all times. Methods: Explain/Verbal Responses: State content correctly Wound/Skin Impairment: Handouts: Caring for Your Ulcer, Other: continue wound care as prescribed Methods: Explain/Verbal Responses: State content correctly Electronic Signature(s) Signed: 05/20/2022 12:29:49 PM By: Gretta Cool, BSN, RN, CWS, Kim RN, BSN Entered By: Gretta Cool, BSN, RN, CWS, Kim on 05/20/2022 10:35:47 -------------------------------------------------------------------------------- Wound Assessment Details Patient Name: Date of Service: [name], [name] 05/20/2022 9:30 A M Medical Record Number: [id number] Patient Account Number: [id number] Date of Birth/Sex: Treating RN: [birth year] (41 y.o. [name] Primary Care Garfield Coiner: Karle Plumber Other Clinician: Referring Fergus Throne: Treating Hilja Kintzel/Extender: Eldridge Dace, [name] Debbora Dus, Pecola Leisure in Treatment: 1 ABDI, HUSAK ([id number]) [id number] Page 8 of 12 Wound Status Wound Number: 1 Primary Diabetic Wound/Ulcer of the Lower Extremity Etiology: Wound Location: Right, Lateral Foot Wound Status: Open Wounding Event: Gradually Appeared Comorbid Hypertension, Peripheral Venous Disease, Type II Diabetes, Date Acquired: 05/12/2022 History: Osteomyelitis Weeks Of Treatment: 1 Clustered Wound: No Pending Amputation On Presentation Photos Wound Measurements Length: (cm) 0.3 Width: (cm) 0.5 Depth: (cm) 0.1 Area: (cm) 0.118 Volume: (cm) 0.012 % Reduction in Area: 16.3% % Reduction in Volume: 14.3% Epithelialization: None Tunneling: No Undermining: No Wound Description Classification: Grade 1 Exudate Amount: Medium Exudate Type: Serosanguineous Exudate Color: red, brown Foul Odor After Cleansing: No Slough/Fibrino No Wound Bed Granulation Amount: Medium (34-66%) Exposed Structure Granulation  Quality: Red, Pink Fascia Exposed: No Necrotic Amount: Medium (34-66%) Fat Layer (Subcutaneous Tissue) Exposed: Yes Necrotic Quality: Adherent Slough Tendon Exposed: No Muscle Exposed: No Joint Exposed: No Bone Exposed: No Electronic Signature(s) Signed: 05/20/2022 12:29:49 PM By: Gretta Cool, BSN, RN, CWS, Kim RN, BSN Entered By: Gretta Cool, BSN, RN, CWS, Kim on 05/20/2022 10:19:56 -------------------------------------------------------------------------------- Wound Assessment Details Patient Name: Date of Service: [name] 05/20/2022 9:30 A M Medical Record Number: [id number] Patient Account Number: [id number] Date of Birth/Sex: Treating RN: 08-Jun-1980 (41 y.o. [name] Primary Care Satoshi Kalas: Karle Plumber Other Clinician: Referring Devlynn Knoff: Treating Elayne Gruver/Extender: RO BSO Delane Ginger, [place] EL Oletta Cohn in Treatment: 1 Wound Status [name], [name] ([id number]) [id number] Page 9 of 12 Wound Number: 2 Primary Diabetic Wound/Ulcer of the Lower Extremity Etiology: Wound Location: Left, Medial Foot Wound Status: Open Wounding Event: Gradually Appeared Comorbid Hypertension, Peripheral Venous Disease, Type II Diabetes, Date Acquired: 02/23/2022 History: Osteomyelitis Weeks Of Treatment: 1 Clustered Wound: No Pending Amputation On Presentation Photos Wound Measurements Length: (cm) 1.5 Width: (cm) 1.5 Depth: (cm) 0.2 Area: (cm) 1.767 Volume: (cm) 0.353 % Reduction in Area: -349.6% % Reduction in Volume: -805.1% Epithelialization: Small (1-33%) Undermining: Yes Starting Position (o'clock): 2 Ending Position (o'clock): 9 Maximum Distance: (cm) 0.7 Wound Description Classification: Grade 1 Wound Margin: Flat and Intact Exudate Amount: Medium Exudate Type: Serosanguineous Exudate Color: red, brown Foul Odor After Cleansing: No Slough/Fibrino No Wound Bed Granulation Amount: Small (1-33%) Exposed  Structure Granulation Quality: Red, Pink Fascia Exposed: No Necrotic Amount: Small (1-33%) Fat Layer (Subcutaneous Tissue) Exposed: Yes Necrotic Quality: Adherent Slough Tendon Exposed: No Muscle Exposed: No Joint Exposed: No Bone Exposed: No Electronic Signature(s) Signed: 05/20/2022 12:29:49 PM By: Gretta Cool, BSN, RN, CWS, Kim RN, BSN Entered By: Gretta Cool, BSN, RN, CWS, Kim on 05/20/2022 10:20:12 -------------------------------------------------------------------------------- Wound Assessment Details Patient Name: Date of  Service: [name], [name] 05/20/2022 9:30 A M Medical Record Number: [id number] Patient Account Number: [id number] Date of Birth/Sex: Treating RN: 12-24-1980 (41 y.o. [name] Primary Care Marshella Tello: Karle Plumber Other Clinician: Referring Ledger Heindl: Treating Blessed Girdner/Extender: Eldridge Dace, [name] Debbora Dus, Pecola Leisure in Treatment: 1 BENI, TURRELL ([id number]) [id number] Page 10 of 12 Wound Status Wound Number: 3 Primary Diabetic Wound/Ulcer of the Lower Extremity Etiology: Wound Location: Left, Posterior Foot Wound Status: Open Wounding Event: Gradually Appeared Comorbid Hypertension, Peripheral Venous Disease, Type II Diabetes, Date Acquired: 02/23/2022 History: Osteomyelitis Weeks Of Treatment: 1 Clustered Wound: No Pending Amputation On Presentation Photos Wound Measurements Length: (cm) 1.7 Width: (cm) 1.5 Depth: (cm) 0.2 Area: (cm) 2.003 Volume: (cm) 0.401 % Reduction in Area: 66% % Reduction in Volume: 66% Epithelialization: Small (1-33%) Wound Description Classification: Grade 1 Exudate Amount: Medium Exudate Type: Serosanguineous Exudate Color: red, brown Foul Odor After Cleansing: No Slough/Fibrino No Wound Bed Granulation Amount: Small (1-33%) Exposed Structure Granulation Quality: Red, Pink Fascia Exposed: No Necrotic Amount: Small (1-33%) Fat Layer (Subcutaneous Tissue) Exposed:  Yes Necrotic Quality: Adherent Slough Tendon Exposed: No Muscle Exposed: No Joint Exposed: No Bone Exposed: No Electronic Signature(s) Signed: 05/20/2022 12:29:49 PM By: Gretta Cool, BSN, RN, CWS, Kim RN, BSN Entered By: Gretta Cool, BSN, RN, CWS, Kim on 05/20/2022 10:20:27 -------------------------------------------------------------------------------- Wound Assessment Details Patient Name: Date of Service: [name] 05/20/2022 9:30 A M Medical Record Number: [id number] Patient Account Number: [id number] Date of Birth/Sex: Treating RN: 18-Jul-1980 (41 y.o. [name] Primary Care Moriya Mitchell: Karle Plumber Other Clinician: Referring Xabi Wittler: Treating Rejina Odle/Extender: RO BSO Delane Ginger, [place] EL Oletta Cohn in Treatment: 1 Wound Status [name], [name] ([id number]) [id number] Page 11 of 12 Wound Number: 5 Primary Diabetic Wound/Ulcer of the Lower Extremity Etiology: Wound Location: Left, Proximal, Posterior T Second oe Wound Status: Healed - Epithelialized Wounding Event: Gradually Appeared Comorbid Hypertension, Peripheral Venous Disease, Type II Diabetes, Date Acquired: 02/16/2022 History: Osteomyelitis Weeks Of Treatment: 1 Clustered Wound: Yes Pending Amputation On Presentation Photos Wound Measurements Length: (cm) Width: (cm) Depth: (cm) Area: (cm) Volume: (cm) 0 % Reduction in Area: 100% 0 % Reduction in Volume: 100% 0 0 0 Wound Description Classification: Grade 1 Exudate Amount: Medium Exudate Type: Serosanguineous Exudate Color: red, brown Foul Odor After Cleansing: No Slough/Fibrino No Wound Bed Granulation Amount: Small (1-33%) Exposed Structure Granulation Quality: Red, Pink Fascia Exposed: No Necrotic Amount: Small (1-33%) Fat Layer (Subcutaneous Tissue) Exposed: No Tendon Exposed: No Muscle Exposed: No Joint Exposed: No Bone Exposed: No Treatment Notes Wound #5 (Toe Second) Wound Laterality: Left,  Posterior, Proximal Cleanser Peri-Wound Care Topical Primary Dressing Secondary Dressing Secured With Compression Wrap Compression Stockings Add-Ons Electronic Signature(s) Signed: 05/20/2022 12:29:49 PM By: Gretta Cool, BSN, RN, CWS, Kim RN, BSN Entered By: Gretta Cool, BSN, RN, CWS, Kim on 05/20/2022 10:15:43 [name] ([id number]) [id number].pdf Page 12 of 12 -------------------------------------------------------------------------------- Vitals Details Patient Name: Date of Service: [name], [name] 05/20/2022 9:30 A M Medical Record Number: [id number] Patient Account Number: [id number] Date of Birth/Sex: Treating RN: 09-06-1980 (41 y.o. [name] Primary Care [place] Noori: Karle Plumber Other Clinician: Referring Arran Fessel: Treating Claressa Hughley/Extender: [name], [name] Debbora Dus, [name] in Treatment: 1 Vital Signs Time Taken: 09:47 Temperature (F): 97.8 Height (in): 73 Pulse (bpm): 81 Weight (lbs): 333 Respiratory Rate (breaths/min): 18 Body Mass Index (BMI): 43.9 Blood Pressure (mmHg): 128/82 Reference Range: 80 - 120 mg / dl Electronic Signature(s) Signed: 05/21/2022 4:56:13 PM By: Rosalio Loud MSN  RN CNS WTA Previous Signature: 05/20/2022 12:29:49 PM Version By: Gretta Cool, BSN, RN, CWS, Kim RN, BSN Entered By: Rosalio Loud on 05/20/2022 15:59:26

## 2022-05-21 NOTE — Progress Notes (Signed)
[name], [name] ([id number]) [id number] Page 1 of 8 Visit Report for 05/20/2022 Debridement Details Patient Name: Date of Service: [name], [name] 05/20/2022 9:30 A M Medical Record Number: [id number] Patient Account Number: [id number] Date of Birth/Sex: Treating RN: [name] (41 y.o. [name] Primary Care Provider: Karle [name] Other Clinician: Referring Provider: Treating Provider/Extender: [name], [name] [name] in Treatment: 1 Debridement Performed for Assessment: Wound #3 [place] Performed By: Physician [name] Dillon, MD Debridement Type: Debridement Severity of Tissue Pre Debridement: Fat layer exposed Level of Consciousness (Pre-procedure): Awake and Alert Pre-procedure Verification/Time Out Yes - 10:12 Taken: T Area Debrided (L x W): otal 1.7 (cm) x 1.5 (cm) = 2.55 (cm) Tissue and other material debrided: Viable, Non-Viable, Callus, Subcutaneous, Biofilm Level: Skin/Subcutaneous Tissue Debridement Description: Excisional Instrument: Curette Bleeding: Minimum Hemostasis Achieved: Pressure Response to Treatment: Procedure was tolerated well Level of Consciousness (Post- Awake and Alert procedure): Post Debridement Measurements of Total Wound Length: (cm) 1.7 Width: (cm) 1.5 Depth: (cm) 0.3 Volume: (cm) 0.601 Character of Wound/Ulcer Post Debridement: Stable Severity of Tissue Post Debridement: Fat layer exposed Post Procedure Diagnosis Same as Pre-procedure Electronic Signature(s) Signed: 05/20/2022 12:29:49 PM By: [name], BSN, RN, CWS, Kim RN, BSN Signed: 05/20/2022 4:44:55 PM By: [name] Ham MD Entered By: [name], BSN, RN, CWS, [name] on 05/20/2022 10:14:[name] -------------------------------------------------------------------------------- Debridement Details Patient Name: Date of Service: [name] 05/20/2022 9:30 A M Medical Record Number: [id number] Patient Account  Number: [id number] [name], [name] [id number] Page 2 of 8 Date of Birth/Sex: Treating RN: [birth year][name] (41 y.o. [name] Primary Care Provider: Karle [name] Other Clinician: Referring Provider: Treating Provider/Extender: [name], [name] [name], [name] in Treatment: 1 Debridement Performed for Assessment: Wound #2 Left,Medial Foot Performed By: Physician [name] Dillon, MD Debridement Type: Debridement Severity of Tissue Pre Debridement: Fat layer exposed Level of Consciousness (Pre-procedure): Awake and Alert Pre-procedure Verification/Time Out Yes - 10:12 Taken: T Area Debrided (L x W): otal 1.5 (cm) x 1.5 (cm) = 2.25 (cm) Tissue and other material debrided: Viable, Non-Viable, Callus, Subcutaneous, Biofilm Level: Skin/Subcutaneous Tissue Debridement Description: Excisional Instrument: Curette Bleeding: Minimum Hemostasis Achieved: Pressure Response to Treatment: Procedure was tolerated well Level of Consciousness (Post- Awake and Alert procedure): Post Debridement Measurements of Total Wound Length: (cm) 1.5 Width: (cm) 1.5 Depth: (cm) 0.3 Volume: (cm) 0.53 Character of Wound/Ulcer Post Debridement: Stable Severity of Tissue Post Debridement: Fat layer exposed Post Procedure Diagnosis Same as Pre-procedure Electronic Signature(s) Signed: 05/20/2022 12:29:49 PM By: [name], BSN, RN, CWS, Kim RN, BSN Signed: 05/20/2022 4:44:55 PM By: [name] Ham MD Entered By: [name], BSN, RN, CWS, [name] on 05/20/2022 10:14:48 -------------------------------------------------------------------------------- HPI Details Patient Name: Date of Service: [name] 05/20/2022 9:30 A M Medical Record Number: [id number] Patient Account Number: [id number] Date of Birth/Sex: Treating RN: [name], [name] (41 y.o. [name] Primary Care Provider: Karle [name] Other Clinician: Referring Provider: Treating  Provider/Extender: [name], [name] [name] in Treatment: 1 History of Present Illness HPI Description: 05/13/2022 [name] is a 41 year old male with uncontrolled insulin-dependent type 2 diabetes with last hemoglobin A1c of 11.1, previous feet wounds that led to bilateral great toe amputations, ADHD and bipolar depression that presents the clinic for a 1 month history of non healing ulcers to the Feet bilaterally. His previous bilateral great toe amputation sites have healed. He has been using an antibacterial spray to keep the areas clean. He has  not been dressing the wounds. He states that over the past couple days he has had increased redness and warmth to the right foot. He has regular tennis shoes. He does not use any offloading device. He states he walks around in socks and does not go barefoot. 05/20/2022. This is a 41 year old man with type 2 diabetes and neuropathy. He has wounds on his right first metatarsal head and the base of his right fourth and fifth toes. He has been using Medihoney. Apparently there was a plan to have him casted today. He has had previously amputated first toes bilaterally a year to a year and a half ago by Dr. Sharol [name] for underlying osteomyelitis according to the patient. The patient has not had any foot wear. Because his forefoot is so wide he simply ordered his shoes online. Last week because of redness and warmth on the left foot he was put on Augmentin and doxycycline [name], [name] ([id number]) [phone].pdf Page 3 of 8 Electronic Signature(s) Signed: 05/20/2022 4:44:55 PM By: [name] Ham MD Entered By: [name] on 05/20/2022 10:24:30 -------------------------------------------------------------------------------- Physical Exam Details Patient Name: Date of Service: [name] 05/20/2022 9:30 A M Medical Record Number: [id number] Patient Account Number: [id number] Date of  Birth/Sex: Treating RN: [birth year][name] (41 y.o. [name] Primary Care Provider: Karle [name] Other Clinician: Referring Provider: Treating Provider/Extender: [name] BSO N, [name] [name], [name] in Treatment: 1 Constitutional Sitting or standing Blood Pressure is within target range for patient.. Pulse regular and within target range for patient.Marland Kitchen Respirations regular, non-labored and within target range.. Temperature is normal and within the target range for the patient.Marland Kitchen appears in no distress. Cardiovascular Pedal pulses palpable and strong bilaterally.. Notes Wound exam; on the left foot he has a small wound on the plantar first metatarsal head with raised callus around the edges. Similarly at the base of the fourth and fifth toes on the left foot. On the right foot he has a more superficial wound on the plantar right first metatarsal head. He has very wide forefoot bilaterally. On the left some subluxation of the first metatarsal head wasting of the intrinsic musculature of the foot Electronic Signature(s) Signed: 05/20/2022 4:44:55 PM By: [name] Ham MD Entered By: [name] on 05/20/2022 38:18:29 -------------------------------------------------------------------------------- Physician Orders Details Patient Name: Date of Service: [name] 05/20/2022 9:30 A M Medical Record Number: [id number] Patient Account Number: [id number] Date of Birth/Sex: Treating RN: [name] (41 y.o. [name] Primary Care Provider: Karle [name] Other Clinician: Referring Provider: Treating Provider/Extender: [name] BSO N, [name] [name] in Treatment: 1 Verbal / Phone Orders: No Diagnosis Coding Follow-up Appointments Return Appointment in 1 week. [name], [name] ([id number]) [id number] Page 4 of 8 Bathing/ Shower/ Hygiene [name] shower; gently cleanse wound with antibacterial soap, rinse and pat dry prior to  dressing wounds Off-Loading Open toe surgical shoe with peg assist. Wound Treatment Wound #1 - Foot Wound Laterality: Right, Lateral Cleanser: Byram Ancillary Kit - 15 Day Supply (Generic) 1 x Per Day/30 Days Discharge Instructions: Use supplies as instructed; Kit contains: (15) Saline Bullets; (15) 3x3 Gauze; 15 pr Gloves Cleanser: Soap and Water 1 x Per Day/30 Days Discharge Instructions: Gently cleanse wound with antibacterial soap, rinse and pat dry prior to dressing wounds Prim Dressing: Silvercel Small 2x2 (in/in) (DME) (Generic) 1 x Per Day/30 Days ary Discharge Instructions: Apply Silvercel Small 2x2 (in/in) as instructed Secondary Dressing: T Adhesive [employer], 4x4 (in/in) (DME) (Generic) 1 x Per  Day/30 Days elfa Discharge Instructions: Apply over dressing to secure in place. Wound #2 - Foot Wound Laterality: Left, Medial Cleanser: Byram Ancillary Kit - 15 Day Supply (Generic) 1 x Per Day/30 Days Discharge Instructions: Use supplies as instructed; Kit contains: (15) Saline Bullets; (15) 3x3 Gauze; 15 pr Gloves Cleanser: Soap and Water 1 x Per Day/30 Days Discharge Instructions: Gently cleanse wound with antibacterial soap, rinse and pat dry prior to dressing wounds Prim Dressing: Silvercel Small 2x2 (in/in) (DME) (Generic) 1 x Per Day/30 Days ary Discharge Instructions: Apply Silvercel Small 2x2 (in/in) as instructed Secondary Dressing: T Adhesive [employer], 4x4 (in/in) (DME) (Generic) 1 x Per Day/30 Days elfa Discharge Instructions: Apply over dressing to secure in place. Wound #3 - Foot Wound Laterality: Left, Posterior Cleanser: Byram Ancillary Kit - 15 Day Supply (Generic) 1 x Per Day/30 Days Discharge Instructions: Use supplies as instructed; Kit contains: (15) Saline Bullets; (15) 3x3 Gauze; 15 pr Gloves Cleanser: Soap and Water 1 x Per Day/30 Days Discharge Instructions: Gently cleanse wound with antibacterial soap, rinse and pat dry prior to dressing  wounds Prim Dressing: Silvercel Small 2x2 (in/in) (DME) (Generic) 1 x Per Day/30 Days ary Discharge Instructions: Apply Silvercel Small 2x2 (in/in) as instructed Secondary Dressing: T Adhesive [employer], 4x4 (in/in) (DME) (Generic) 1 x Per Day/30 Days elfa Discharge Instructions: Apply over dressing to secure in place. Electronic Signature(s) Signed: 05/20/2022 4:44:55 PM By: [name] Ham MD Signed: 05/21/2022 4:56:13 PM By: Rosalio Loud MSN RN CNS WTA Previous Signature: 05/20/2022 12:29:49 PM Version By: [name] BSN, RN, CWS, Kim RN, BSN Entered By: Rosalio Loud on 05/20/2022 16:01:54 -------------------------------------------------------------------------------- Problem List Details Patient Name: Date of Service: [name] 05/20/2022 9:30 A M Medical Record Number: [id number] Patient Account Number: [id number] Date of Birth/Sex: Treating RN: [name] (41 y.o. [name] Primary Care Provider: Karle [name] Other Clinician: Referring Provider: Treating Provider/Extender: [name], [name] [name], [name] in Treatment: 1 [name], [name] ([id number]) [id number] Page 5 of 8 Active Problems ICD-10 Encounter Code Description Active Date MDM Diagnosis E11.621 Type 2 diabetes mellitus with foot ulcer 05/13/2022 No Yes L97.522 Non-pressure chronic ulcer of other part of left foot with fat layer exposed 05/13/2022 No Yes L97.512 Non-pressure chronic ulcer of other part of right foot with fat layer exposed 05/13/2022 No Yes I87.2 Venous insufficiency (chronic) (peripheral) 05/13/2022 No Yes Z89.412 Acquired absence of left great toe 05/13/2022 No Yes Z89.411 Acquired absence of right great toe 05/13/2022 No Yes Inactive Problems Resolved Problems Electronic Signature(s) Signed: 05/20/2022 4:44:55 PM By: [name] Ham MD Entered By: [name] on 05/20/2022  10:21:56 -------------------------------------------------------------------------------- Progress Note Details Patient Name: Date of Service: [name] 05/20/2022 9:30 A M Medical Record Number: [id number] Patient Account Number: [id number] Date of Birth/Sex: Treating RN: [birth year] [name] (41 y.o. Isac Sarna, Maudie Mercury Primary Care Provider: Karle [name] Other Clinician: Referring Provider: Treating Provider/Extender: [name], [name] [name] in Treatment: 1 Subjective History of Present Illness (HPI) 05/13/2022 Mr. Nazeer Romney is a 41 year old male with uncontrolled insulin-dependent type 2 diabetes with last hemoglobin A1c of 11.1, previous feet wounds that led to bilateral great toe amputations, ADHD and bipolar depression that presents the clinic for a 1 month history of non healing ulcers to the Feet bilaterally. His previous bilateral great toe amputation sites have healed. He has been using an antibacterial spray to keep the areas clean. He has not been dressing the wounds. He states that over the past  couple days he has had increased redness and warmth to the right foot. He has regular tennis shoes. He does not use any offloading device. He states he walks around in socks and does not go barefoot. 05/20/2022. This is a 41 year old man with type 2 diabetes and neuropathy. He has wounds on his right first metatarsal head and the base of his right fourth and fifth toes. He has been using Medihoney. Apparently there was a plan to have him casted today. He has had previously amputated first toes bilaterally a year to a year and a half ago by Dr. Sharol [name] for underlying osteomyelitis according to the patient. The patient has not had any foot wear. Because his forefoot is so wide he simply ordered his shoes online. [name], [name] ([id number]) [id number] Page 6 of 8 Last week because of redness and warmth on the left foot he was put on  Augmentin and doxycycline Objective Constitutional Sitting or standing Blood Pressure is within target range for patient.. Pulse regular and within target range for patient.Marland Kitchen Respirations regular, non-labored and within target range.. Temperature is normal and within the target range for the patient.Marland Kitchen appears in no distress. Vitals Time Taken: 9:47 AM, Height: 73 in, Weight: 333 lbs, BMI: 43.9, Temperature: 97.8 F, Pulse: 81 bpm, Respiratory Rate: 18 breaths/min, Blood Pressure: 128/82 mmHg. Cardiovascular Pedal pulses palpable and strong bilaterally.. General Notes: Wound exam; on the left foot he has a small wound on the plantar first metatarsal head with raised callus around the edges. Similarly at the base of the fourth and fifth toes on the left foot. On the right foot he has a more superficial wound on the plantar right first metatarsal head. He has very wide forefoot bilaterally. On the left some subluxation of the first metatarsal head wasting of the intrinsic musculature of the foot Integumentary (Hair, Skin) Wound #1 status is Open. Original cause of wound was Gradually Appeared. The date acquired was: 05/12/2022. The wound has been in treatment 1 [name]. The wound is located on the Right,Lateral Foot. The wound measures 0.3cm length x 0.5cm width x 0.1cm depth; 0.118cm^2 area and 0.012cm^3 volume. There is Fat Layer (Subcutaneous Tissue) exposed. There is no tunneling or undermining noted. There is a medium amount of serosanguineous drainage noted. There is medium (34-66%) red, pink granulation within the wound bed. There is a medium (34-66%) amount of necrotic tissue within the wound bed including Adherent Slough. Wound #2 status is Open. Original cause of wound was Gradually Appeared. The date acquired was: 02/23/2022. The wound has been in treatment 1 [name]. The wound is located on the Left,Medial Foot. The wound measures 1.5cm length x 1.5cm width x 0.2cm depth; 1.767cm^2 area and  0.353cm^3 volume. There is Fat Layer (Subcutaneous Tissue) exposed. There is undermining starting at 2:00 and ending at 9:00 with a maximum distance of 0.7cm. There is a medium amount of serosanguineous drainage noted. The wound margin is flat and intact. There is small (1-33%) red, pink granulation within the wound bed. There is a small (1-33%) amount of necrotic tissue within the wound bed including Adherent Slough. Wound #3 status is Open. Original cause of wound was Gradually Appeared. The date acquired was: 02/23/2022. The wound has been in treatment 1 [name]. The wound is located on the [place]. The wound measures 1.7cm length x 1.5cm width x 0.2cm depth; 2.003cm^2 area and 0.401cm^3 volume. There is Fat Layer (Subcutaneous Tissue) exposed. There is a medium amount of serosanguineous drainage noted. There is  small (1-33%) red, pink granulation within the wound bed. There is a small (1-33%) amount of necrotic tissue within the wound bed including Adherent Slough. Wound #5 status is Healed - Epithelialized. Original cause of wound was Gradually Appeared. The date acquired was: 02/16/2022. The wound has been in treatment 1 [name]. The wound is located on the Left,Proximal,Posterior T Second. The wound measures 0cm length x 0cm width x 0cm depth; 0cm^2 area oe and 0cm^3 volume. There is a medium amount of serosanguineous drainage noted. There is small (1-33%) red, pink granulation within the wound bed. There is a small (1-33%) amount of necrotic tissue within the wound bed. Assessment Active Problems ICD-10 Type 2 diabetes mellitus with foot ulcer Non-pressure chronic ulcer of other part of left foot with fat layer exposed Non-pressure chronic ulcer of other part of right foot with fat layer exposed Venous insufficiency (chronic) (peripheral) Acquired absence of left great toe Acquired absence of right great toe Procedures Wound #2 Pre-procedure diagnosis of Wound #2 is a Diabetic  Wound/Ulcer of the Lower Extremity located on the Left,Medial Foot .Severity of Tissue Pre Debridement is: Fat layer exposed. There was a Excisional Skin/Subcutaneous Tissue Debridement with a total area of 2.25 sq cm performed by [name] Dillon, MD. With the following instrument(s): Curette to remove Viable and Non-Viable tissue/material. Material removed includes Callus, Subcutaneous Tissue, and Biofilm. A time out was conducted at 10:12, prior to the start of the procedure. A Minimum amount of bleeding was controlled with Pressure. The procedure was tolerated well. Post Debridement Measurements: 1.5cm length x 1.5cm width x 0.3cm depth; 0.53cm^3 volume. Character of Wound/Ulcer Post Debridement is stable. Severity of Tissue Post Debridement is: Fat layer exposed. Post procedure Diagnosis Wound #2: Same as Pre-Procedure Wound #3 Pre-procedure diagnosis of Wound #3 is a Diabetic Wound/Ulcer of the Lower Extremity located on the [place] .Severity of Tissue Pre Debridement is: Fat layer exposed. There was a Excisional Skin/Subcutaneous Tissue Debridement with a total area of 2.55 sq cm performed by [name] Dillon, MD. With the following instrument(s): Curette to remove Viable and Non-Viable tissue/material. Material removed includes Callus, Subcutaneous [name], [name] ([id number]) [id number] Page 7 of 8 Tissue, and Biofilm. A time out was conducted at 10:12, prior to the start of the procedure. A Minimum amount of bleeding was controlled with Pressure. The procedure was tolerated well. Post Debridement Measurements: 1.7cm length x 1.5cm width x 0.3cm depth; 0.601cm^3 volume. Character of Wound/Ulcer Post Debridement is stable. Severity of Tissue Post Debridement is: Fat layer exposed. Post procedure Diagnosis Wound #3: Same as Pre-Procedure Plan Follow-up Appointments: Return Appointment in 1 week. - on Wed then on Friday and following  Wed Bathing/ Shower/ Hygiene: [name] shower; gently cleanse wound with antibacterial soap, rinse and pat dry prior to dressing wounds Off-Loading: Open toe surgical shoe with peg assist. WOUND #1: - Foot Wound Laterality: Right, Lateral Cleanser: Byram Ancillary Kit - 15 Day Supply (Generic) 1 x Per Day/30 Days Discharge Instructions: Use supplies as instructed; Kit contains: (15) Saline Bullets; (15) 3x3 Gauze; 15 pr Gloves Cleanser: Soap and Water 1 x Per Day/30 Days Discharge Instructions: Gently cleanse wound with antibacterial soap, rinse and pat dry prior to dressing wounds Prim Dressing: Silvercel Small 2x2 (in/in) (DME) (Generic) 1 x Per Day/30 Days ary Discharge Instructions: Apply Silvercel Small 2x2 (in/in) as instructed Secondary Dressing: T Adhesive [employer], 4x4 (in/in) (DME) (Generic) 1 x Per Day/30 Days elfa Discharge Instructions: Apply over dressing to secure in place.  WOUND #2: - Foot Wound Laterality: Left, Medial Cleanser: Byram Ancillary Kit - 15 Day Supply (Generic) 1 x Per Day/30 Days Discharge Instructions: Use supplies as instructed; Kit contains: (15) Saline Bullets; (15) 3x3 Gauze; 15 pr Gloves Cleanser: Soap and Water 1 x Per Day/30 Days Discharge Instructions: Gently cleanse wound with antibacterial soap, rinse and pat dry prior to dressing wounds Prim Dressing: Silvercel Small 2x2 (in/in) (DME) (Generic) 1 x Per Day/30 Days ary Discharge Instructions: Apply Silvercel Small 2x2 (in/in) as instructed Secondary Dressing: T Adhesive [employer], 4x4 (in/in) (DME) (Generic) 1 x Per Day/30 Days elfa Discharge Instructions: Apply over dressing to secure in place. WOUND #3: - Foot Wound Laterality: Left, Posterior Cleanser: Byram Ancillary Kit - 15 Day Supply (Generic) 1 x Per Day/30 Days Discharge Instructions: Use supplies as instructed; Kit contains: (15) Saline Bullets; (15) 3x3 Gauze; 15 pr Gloves Cleanser: Soap and Water 1 x Per Day/30  Days Discharge Instructions: Gently cleanse wound with antibacterial soap, rinse and pat dry prior to dressing wounds Prim Dressing: Silvercel Small 2x2 (in/in) (DME) (Generic) 1 x Per Day/30 Days ary Discharge Instructions: Apply Silvercel Small 2x2 (in/in) as instructed Secondary Dressing: T Adhesive [employer], 4x4 (in/in) (DME) (Generic) 1 x Per Day/30 Days elfa Discharge Instructions: Apply over dressing to secure in place. 1. 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 I used a #5 curette to remove thick skin and callus around the edges. Hemostasis with a pressure dressing on the left 3. I did not feel strongly about offloading with a total contact cast this week. We dressed the wound with silver alginate, T adhesive elfa 4. Continued with the Pegasys shoes. It is possible we will need to cast the left foot. He tells me he is not walking on this. He does not work. If the wound stalls or worsen then a total contact cast on the left could be necessary. 5. No evidence of infection here no cultures were done. The wounds are relatively superficial Electronic Signature(s) Signed: 05/20/2022 4:44:55 PM By: [name] Ham MD Entered By: [name] on 05/20/2022 10:28:23 -------------------------------------------------------------------------------- SuperBill Details Patient Name: Date of Service: [name] 05/20/2022 Medical Record Number: [id number] Patient Account Number: [id number] Date of Birth/Sex: Treating RN: [name] (41 y.o. [name] Primary Care Provider: Karle [name] Other Clinician: Referring Provider: Treating Provider/Extender: [name], [name] [name], [name] in Treatment: 1 GRAYDON, FOFANA ([id number]) [id number] Page 8 of 8 Diagnosis Coding ICD-10 Codes Code Description E11.621 Type 2 diabetes mellitus with foot ulcer L97.522 Non-pressure chronic ulcer of other part of left  foot with fat layer exposed L97.512 Non-pressure chronic ulcer of other part of right foot with fat layer exposed I87.2 Venous insufficiency (chronic) (peripheral) Z89.412 Acquired absence of left great toe Z89.411 Acquired absence of right great toe Facility Procedures : CPT4 Code: [id number] Description: 30865 - DEB SUBQ TISSUE 20 SQ CM/< ICD-10 Diagnosis Description L97.522 Non-pressure chronic ulcer of other part of left foot with fat layer exposed Modifier: Quantity: 1 Physician Procedures : CPT4 Code Description Modifier [id number] - WC PHYS SUBQ TISS 20 SQ CM ICD-10 Diagnosis Description L97.522 Non-pressure chronic ulcer of other part of left foot with fat layer exposed Quantity: 1 Electronic Signature(s) Signed: 05/20/2022 4:44:55 PM By: [name] Ham MD Entered By: [name] on 05/20/2022 10:30:36

## 2022-05-22 ENCOUNTER — Ambulatory Visit: Payer: Self-pay | Admitting: Internal Medicine

## 2022-05-27 ENCOUNTER — Ambulatory Visit: Payer: Self-pay | Admitting: Internal Medicine

## 2022-05-28 ENCOUNTER — Ambulatory Visit: Payer: Self-pay | Admitting: Orthopedic Surgery

## 2022-06-05 ENCOUNTER — Ambulatory Visit: Payer: Self-pay | Admitting: Pharmacist

## 2022-06-24 ENCOUNTER — Other Ambulatory Visit (HOSPITAL_COMMUNITY): Payer: Self-pay

## 2022-06-24 ENCOUNTER — Other Ambulatory Visit: Payer: Self-pay

## 2022-06-24 ENCOUNTER — Other Ambulatory Visit: Payer: Self-pay | Admitting: Internal Medicine

## 2022-06-24 MED ORDER — METFORMIN HCL ER 500 MG PO TB24
2000.0000 mg | ORAL_TABLET | Freq: Every day | ORAL | 0 refills | Status: DC
  Filled 2022-06-24: qty 120, 30d supply, fill #0

## 2022-07-07 ENCOUNTER — Other Ambulatory Visit (HOSPITAL_COMMUNITY): Payer: Self-pay

## 2022-07-07 ENCOUNTER — Other Ambulatory Visit: Payer: Self-pay

## 2022-07-10 ENCOUNTER — Other Ambulatory Visit: Payer: Self-pay

## 2022-07-14 ENCOUNTER — Other Ambulatory Visit (HOSPITAL_COMMUNITY): Payer: Self-pay

## 2022-07-28 ENCOUNTER — Ambulatory Visit: Payer: Self-pay | Admitting: Internal Medicine

## 2022-09-28 ENCOUNTER — Ambulatory Visit: Payer: Self-pay | Admitting: Physician Assistant

## 2022-09-30 ENCOUNTER — Encounter: Payer: 59 | Attending: Internal Medicine | Admitting: Internal Medicine

## 2022-09-30 DIAGNOSIS — L97528 Non-pressure chronic ulcer of other part of left foot with other specified severity: Secondary | ICD-10-CM | POA: Diagnosis not present

## 2022-09-30 DIAGNOSIS — I10 Essential (primary) hypertension: Secondary | ICD-10-CM | POA: Insufficient documentation

## 2022-09-30 DIAGNOSIS — E1142 Type 2 diabetes mellitus with diabetic polyneuropathy: Secondary | ICD-10-CM | POA: Insufficient documentation

## 2022-09-30 DIAGNOSIS — Z89412 Acquired absence of left great toe: Secondary | ICD-10-CM | POA: Insufficient documentation

## 2022-09-30 DIAGNOSIS — Z89411 Acquired absence of right great toe: Secondary | ICD-10-CM | POA: Insufficient documentation

## 2022-09-30 DIAGNOSIS — L97522 Non-pressure chronic ulcer of other part of left foot with fat layer exposed: Secondary | ICD-10-CM | POA: Diagnosis not present

## 2022-09-30 DIAGNOSIS — F909 Attention-deficit hyperactivity disorder, unspecified type: Secondary | ICD-10-CM | POA: Diagnosis not present

## 2022-09-30 DIAGNOSIS — Z794 Long term (current) use of insulin: Secondary | ICD-10-CM | POA: Diagnosis not present

## 2022-09-30 DIAGNOSIS — F319 Bipolar disorder, unspecified: Secondary | ICD-10-CM | POA: Diagnosis not present

## 2022-09-30 DIAGNOSIS — E11621 Type 2 diabetes mellitus with foot ulcer: Secondary | ICD-10-CM | POA: Diagnosis not present

## 2022-09-30 NOTE — Progress Notes (Addendum)
ADVIK, MCGINLEY ([id number]) [id number] Page 1 of 12 Visit Report for 09/30/2022 Allergy List Details Patient Name: Date of Service: [name], [name] 09/30/2022 8:45 A M Medical Record Number: [id number] Patient Account Number: [id number] Date of Birth/Sex: Treating RN: 1980/09/03 (41 y.o. [name]) [name] Primary Care Dujuan Stankowski: [name] Other Clinician: Referring Elease Swarm: Treating Vikram Tillett/Extender: [name], [name] [name], [name] in Treatment: 0 Allergies Active Allergies milk Severity: Moderate Allergy Notes Electronic Signature(s) Signed: 09/30/2022 9:53:20 AM By: [name] RN Entered By: [name] on 09/30/2022 08:55:59 -------------------------------------------------------------------------------- Arrival Information Details Patient Name: Date of Service: [name] 09/30/2022 8:45 A M Medical Record Number: [id number] Patient Account Number: [id number] Date of Birth/Sex: Treating RN: 12/27/1980 (41 y.o. [name]) [name] Primary Care Hayslee Casebolt: [name] Other Clinician: Referring Marqueta Pulley: Treating Aysen Shieh/Extender: Chauncey Mann, [name] in Treatment: 0 Visit Information Patient Arrived: Ambulatory Arrival Time: 08:53 Accompanied By: self Transfer Assistance: None Patient Identification Verified: Yes Secondary Verification Process Completed: Yes Patient Requires Transmission-Based Precautions: No Patient Has Alerts: Yes Patient Alerts: Left ABI 1.03 05/13/22 Right ABI .78 05/13/22 History Since Last Visit Added or deleted any medications: No Any new allergies or adverse reactions: No Had a fall or experienced change in activities of daily living that may affect risk of falls: No Signs or symptoms of abuse/neglect since last visito No Hospitalized since last visit: No Implantable device outside of the clinic excluding cellular tissue based products placed in the center  since last visit: No Electronic Signature(s) Signed: 09/30/2022 9:53:20 AM By: [name] RN Entered By: [name] on 09/30/2022 09:07:13 -------------------------------------------------------------------------------- Clinic Level of Care Assessment Details Patient Name: Date of Service: [name], [name] 09/30/2022 8:45 A M Medical Record Number: [id number] Patient Account Number: [id number] Date of Birth/Sex: Treating RN: 1981/02/15 (41 y.o. [name] Primary Care Ryon Layton: [name] Other Clinician: Referring Jerrol Helmers: Treating [name]/Extender: [name], [name] [name], [name] in Treatment: 0 Clinic Level of Care Assessment Items TOOL 4 Quantity Score X- 1 0 Use when only an EandM is performed on FOLLOW-UP visit [name], [name] ([id number]) [id number] Page 2 of 12 ASSESSMENTS - Nursing Assessment / Reassessment X- 1 10 Reassessment of Co-morbidities (includes updates in patient status) X- 1 5 Reassessment of Adherence to Treatment Plan ASSESSMENTS - Wound and Skin A ssessment / Reassessment []  - 0 Simple Wound Assessment / Reassessment - one wound X- 3 5 Complex Wound Assessment / Reassessment - multiple wounds []  - 0 Dermatologic / Skin Assessment (not related to wound area) ASSESSMENTS - Focused Assessment []  - 0 Circumferential Edema Measurements - multi extremities []  - 0 Nutritional Assessment / Counseling / Intervention []  - 0 Lower Extremity Assessment (monofilament, tuning fork, pulses) []  - 0 Peripheral Arterial Disease Assessment (using hand held doppler) ASSESSMENTS - Ostomy and/or Continence Assessment and Care []  - 0 Incontinence Assessment and Management []  - 0 Ostomy Care Assessment and Management (repouching, etc.) PROCESS - Coordination of Care X - Simple Patient / Family Education for ongoing care 1 15 []  - 0 Complex (extensive) Patient / Family Education for ongoing care []  - 0 Staff  obtains Chiropractor, Records, T Results / Process Orders est []  - 0 Staff telephones HHA, Nursing Homes / Clarify orders / etc []  - 0 Routine Transfer to another Facility (non-emergent condition) []  - 0 Routine Hospital Admission (non-emergent condition) X- 1 15 New Admissions / Manufacturing engineer / Ordering NPWT Apligraf, etc. , []  -  0 Emergency Hospital Admission (emergent condition) X- 1 10 Simple Discharge Coordination []  - 0 Complex (extensive) Discharge Coordination PROCESS - Special Needs []  - 0 Pediatric / Minor Patient Management []  - 0 Isolation Patient Management []  - 0 Hearing / Language / Visual special needs []  - 0 Assessment of Community assistance (transportation, D/[name] planning, etc.) []  - 0 Additional assistance / Altered mentation []  - 0 Support Surface(s) Assessment (bed, cushion, seat, etc.) INTERVENTIONS - Wound Cleansing / Measurement []  - 0 Simple Wound Cleansing - one wound X- 3 5 Complex Wound Cleansing - multiple wounds X- 1 5 Wound Imaging (photographs - any number of wounds) []  - 0 Wound Tracing (instead of photographs) []  - 0 Simple Wound Measurement - one wound X- 3 5 Complex Wound Measurement - multiple wounds INTERVENTIONS - Wound Dressings X - Small Wound Dressing one or multiple wounds 3 10 []  - 0 Medium Wound Dressing one or multiple wounds []  - 0 Large Wound Dressing one or multiple wounds []  - 0 Application of Medications - topical [name], [name] [id number] Page 3 of 12 []  - 0 Application of Medications - injection INTERVENTIONS - Miscellaneous []  - 0 External ear exam []  - 0 Specimen Collection (cultures, biopsies, blood, body fluids, etc.) []  - 0 Specimen(s) / Culture(s) sent or taken to Lab for analysis []  - 0 Patient Transfer (multiple staff / Michiel Sites Lift / Similar devices) []  - 0 Simple Staple / Suture removal (25 or less) []  - 0 Complex Staple / Suture removal (26  or more) []  - 0 Hypo / Hyperglycemic Management (close monitor of Blood Glucose) []  - 0 Ankle / Brachial Index (ABI) - do not check if billed separately X- 1 5 Vital Signs Has the patient been seen at the hospital within the last three years: Yes Total Score: 140 Level Of Care: New/Established - Level 4 Electronic Signature(s) Unsigned Entered ByYevonne Pax on 09/30/2022 10:02:20 -------------------------------------------------------------------------------- Encounter Discharge Information Details Patient Name: Date of Service: JENE, GUTHERIE 09/30/2022 8:45 A M Medical Record Number: [id number] Patient Account Number: [id number] Date of Birth/Sex: Treating RN: 12-27-1980 (41 y.o. [name] Primary Care Rafaella Kole: [name] Other Clinician: Referring Jay Kempe: Treating Kedra Mcglade/Extender: [name], [name] in Treatment: 0 Encounter Discharge Information Items Discharge Condition: Stable Ambulatory Status: Ambulatory Discharge Destination: Home Transportation: Private Auto Accompanied By: self Schedule Follow-up Appointment: Yes Clinical Summary of Care: Electronic Signature(s) Signed: 09/30/2022 10:03:25 AM By: [name] RN Entered By: [name] on 09/30/2022 10:03:25 -------------------------------------------------------------------------------- Lower Extremity Assessment Details Patient Name: Date of Service: [name], [name] 09/30/2022 8:45 A M Medical Record Number: [id number] Patient Account Number: [id number] Date of Birth/Sex: Treating RN: 10/06/1980 (41 y.o. [name] Primary Care Trebor Galdamez: [name] Other Clinician: Referring Resha Filippone: Treating Jailey Booton/Extender: [name], [name] [name], [name] in Treatment: 0 Edema Assessment Assessed: [Left: No] [Right: No] Edema: [Left: N] [Right: o] Vascular Assessment Pulses: VAIDEN, WEE [name] ([id number])  [Right:[id number] Page 4 of 12] Dorsalis Pedis Palpable: [Left:Yes] Notes see alerts Electronic Signature(s) Signed: 09/30/2022 9:53:20 AM By: [name] RN Entered By: [name] on 09/30/2022 09:06:31 -------------------------------------------------------------------------------- Multi Wound Chart Details Patient Name: Date of Service: [name] 09/30/2022 8:45 A M Medical Record Number: [id number] Patient Account Number: [id number] Date of Birth/Sex: Treating RN: 12/23/1980 (41 y.o. [name] Primary Care Ashaya Raftery: [name] Other Clinician: Referring Merek Niu: Treating Allicia Culley/Extender: [name], [name] [name], [name] in  Treatment: 0 Vital Signs Height(in): 73 Pulse(bpm): 99 Weight(lbs): 330 Blood Pressure(mmHg): 136/83 Body Mass Index(BMI): 43.5 Temperature(F): 97.9 Respiratory Rate(breaths/min): 18 [6:Photos:] Left Metatarsal head first Left Metatarsal head fourth Left, Lateral Metatarsal head fifth Wound Location: Gradually Appeared Gradually Appeared Gradually Appeared Wounding Event: Diabetic Wound/Ulcer of the Lower Diabetic Wound/Ulcer of the Lower Diabetic Wound/Ulcer of the Lower Primary Etiology: Extremity Extremity Extremity Hypertension, Peripheral Venous Hypertension, Peripheral Venous Hypertension, Peripheral Venous Comorbid History: Disease, Type II Diabetes, Disease, Type II Diabetes, Disease, Type II Diabetes, Osteomyelitis Osteomyelitis Osteomyelitis 02/22/2022 02/22/2022 05/25/2022 Date Acquired: 0 0 0 Weeks of Treatment: Open Open Open Wound Status: No No No Wound Recurrence: Yes Yes Yes Pending A mputation on Presentation: 4x2x0.2 2.5x3.3x0.2 0.8x1x0.2 Measurements L x W x D (cm) 6.283 6.48 0.628 A (cm) : rea 1.257 1.296 0.126 Volume (cm) : Grade 1 Grade 1 Grade 1 Classification: Medium Medium Medium Exudate A mount: Serosanguineous Serosanguineous  Serosanguineous Exudate Type: red, brown red, brown red, brown Exudate Color: Medium (34-66%) Medium (34-66%) Large (67-100%) Granulation A mount: Pink Pink Pink Granulation Quality: Medium (34-66%) Medium (34-66%) Small (1-33%) Necrotic A mount: Fat Layer (Subcutaneous Tissue): Yes Fat Layer (Subcutaneous Tissue): Yes Fat Layer (Subcutaneous Tissue): Yes Exposed Structures: Fascia: No Fascia: No Fascia: No Tendon: No Tendon: No Tendon: No Muscle: No Muscle: No Muscle: No Joint: No Joint: No Joint: No Bone: No Bone: No Bone: No None None None Epithelialization: Chemical/Enzymatic/Mechanical Chemical/Enzymatic/Mechanical Chemical/Enzymatic/Mechanical Debridement: Other(saline gauze) Other(saline gauze) Other(saline gauze) Instrument: Minimum Minimum Minimum Bleeding: Pressure Pressure Pressure Hemostasis A chieved: 0 0 0 Procedural Pain: 0 0 0 Post Procedural Pain: Debridement Treatment Response: Procedure was tolerated well Procedure was tolerated well Procedure was tolerated well Post Debridement Measurements L x 4x2x0.2 2.5x3.3x0.2 0.8x1x0.2 GAVYNN, POLE ([id number]) [id number] Page 5 of 12 W x D (cm) 1.257 1.296 0.126 Post Debridement Volume: (cm) Debridement Debridement Debridement Procedures Performed: Treatment Notes Wound #6 (Metatarsal head first) Wound Laterality: Left Cleanser Soap and Water Discharge Instruction: Gently cleanse wound with antibacterial soap, rinse and pat dry prior to dressing wounds Peri-Wound Care Topical Primary Dressing Hydrofera Blue Ready Transfer Foam, 4x5 (in/in) Discharge Instruction: Apply Hydrofera Blue Ready to wound bed as directed Secondary Dressing Foam Dressing, 4x4 (in/in) Discharge Instruction: alleyn heel cup Secured With ACE WRAP - 12M ACE Elastic Bandage With VELCRO Brand Closure, 4 (in) Compression Wrap Compression Stockings Add-Ons Wound #7 (Metatarsal head fourth)  Wound Laterality: Left Cleanser Soap and Water Discharge Instruction: Gently cleanse wound with antibacterial soap, rinse and pat dry prior to dressing wounds Peri-Wound Care Topical Primary Dressing Hydrofera Blue Ready Transfer Foam, 4x5 (in/in) Discharge Instruction: Apply Hydrofera Blue Ready to wound bed as directed Secondary Dressing Foam Dressing, 4x4 (in/in) Discharge Instruction: alleyn heel cup Secured With ACE WRAP - 12M ACE Elastic Bandage With VELCRO Brand Closure, 4 (in) Compression Wrap Compression Stockings Add-Ons Wound #8 (Metatarsal head fifth) Wound Laterality: Left, Lateral Cleanser Soap and Water Discharge Instruction: Gently cleanse wound with antibacterial soap, rinse and pat dry prior to dressing wounds Peri-Wound Care Topical Primary Dressing Hydrofera Blue Ready Transfer Foam, 4x5 (in/in) Discharge Instruction: Apply Hydrofera Blue Ready to wound bed as directed Secondary Dressing Foam Dressing, 4x4 (in/in) Discharge Instruction: alleyn heel cup Secured With KOTA, CLENDENON ([id number]) [id number] Page 6 of 12 ACE WRAP - 12M ACE Elastic Bandage With VELCRO Brand Closure, 4 (in) Compression Wrap Compression Stockings Add-Ons Electronic Signature(s) Unsigned Previous Signature: 09/30/2022 9:53:20 AM Version By: [name] RN Entered By: Leanord Hawking,  Michael on 09/30/2022 10:46:41 -------------------------------------------------------------------------------- Multi-Disciplinary Care Plan Details Patient Name: Date of Service: [name], [name] 09/30/2022 8:45 A M Medical Record Number: [id number] Patient Account Number: [id number] Date of Birth/Sex: Treating RN: [birth year] (41 y.o. [name]) [name] Primary Care Corine Solorio: [name] Other Clinician: Referring Michael Walrath: Treating Ercole Georg/Extender: [name], [name] [name], [name] in Treatment: 0 Active Inactive Necrotic Tissue Nursing  Diagnoses: Knowledge deficit related to management of necrotic/devitalized tissue Goals: Necrotic/devitalized tissue will be minimized in the wound bed Date Initiated: 09/30/2022 Target Resolution Date: 10/31/2022 Goal Status: Active Interventions: Assess patient pain level pre-, during and post procedure and prior to discharge Notes: Nutrition Nursing Diagnoses: Impaired glucose control: actual or potential Goals: Patient/caregiver verbalizes understanding of need to maintain therapeutic glucose control per primary care physician Date Initiated: 09/30/2022 Target Resolution Date: 10/31/2022 Goal Status: Active Interventions: Assess HgA1c results as ordered upon admission and as needed Assess patient nutrition upon admission and as needed per policy Provide education on elevated blood sugars and impact on wound healing Notes: Wound/Skin Impairment Nursing Diagnoses: Knowledge deficit related to ulceration/compromised skin integrity Goals: Patient/caregiver will verbalize understanding of skin care regimen Date Initiated: 09/30/2022 Target Resolution Date: 10/31/2022 Goal Status: Active Ulcer/skin breakdown will have a volume reduction of 30% by week 4 Date Initiated: 09/30/2022 Target Resolution Date: 10/31/2022 Goal Status: Active Ulcer/skin breakdown will have a volume reduction of 50% by week 8 Date Initiated: 09/30/2022 Target Resolution Date: 11/30/2022 SHIVANG, MCRIGHT ([id number]) [id number] Page 7 of 12 Goal Status: Active Ulcer/skin breakdown will have a volume reduction of 80% by week 12 Date Initiated: 09/30/2022 Target Resolution Date: 12/31/2022 Goal Status: Active Ulcer/skin breakdown will heal within 14 weeks Date Initiated: 09/30/2022 Target Resolution Date: 01/31/2023 Goal Status: Active Interventions: Assess patient/caregiver ability to obtain necessary supplies Assess patient/caregiver ability to perform ulcer/skin care regimen upon admission and  as needed Assess ulceration(s) every visit Notes: Electronic Signature(s) Signed: 09/30/2022 9:53:20 AM By: [name] RN Entered By: [name] on 09/30/2022 09:09:49 -------------------------------------------------------------------------------- Pain Assessment Details Patient Name: Date of Service: [name], [name] 09/30/2022 8:45 A M Medical Record Number: [id number] Patient Account Number: [id number] Date of Birth/Sex: Treating RN: 07/12/1980 (41 y.o. [name] Primary Care Rosaline Ezekiel: [name] Other Clinician: Referring Tailer Volkert: Treating Nester Bachus/Extender: [name], [name] [name], [name] in Treatment: 0 Active Problems Location of Pain Severity and Description of Pain Patient Has Paino No Site Locations Pain Management and Medication Current Pain Management: Electronic Signature(s) Signed: 09/30/2022 9:53:20 AM By: [name] RN Entered By: [name] on 09/30/2022 08:54:17 -------------------------------------------------------------------------------- Patient/Caregiver Education Details Patient Name: Date of Service: [name] 5/8/2024andnbsp8:45 A M Medical Record Number: [id number] Patient Account Number: [id number] Date of Birth/Gender: Treating RN: [birth year] (41 y.o. [name] Primary Care Physician: [name] Other Clinician: Referring Physician: Treating Physician/Extender: Chauncey Mann, [name] [name], [name] in Treatment: 0 MUNTASIR, BETHUNE ([id number]) [id number] Page 8 of 12 Education Assessment Education Provided To: Patient Education Topics Provided Welcome [facility]New [facility] Packet: o Handouts: Welcome [facility] o Methods: Explain/Verbal Responses: State content correctly Electronic Signature(s) Signed: 09/30/2022 9:53:20 AM By: [name] RN Entered By: [name] on 09/30/2022  09:10:01 -------------------------------------------------------------------------------- Wound Assessment Details Patient Name: Date of Service: [name], [name] 09/30/2022 8:45 A M Medical Record Number: [id number] Patient Account Number: [id number] Date of Birth/Sex: Treating RN: [birth year] (41 y.o. [name] Primary Care Zvi Duplantis: [name]  Other Clinician: Referring Lajoya Dombek: Treating Okey Zelek/Extender: [name], [name] [name], [name] in Treatment: 0 Wound Status Wound Number: 6 Primary Diabetic Wound/Ulcer of the Lower Extremity Etiology: Wound Location: Left Metatarsal head first Wound Status: Open Wounding Event: Gradually Appeared Comorbid Hypertension, Peripheral Venous Disease, Type II Diabetes, Date Acquired: 02/22/2022 History: Osteomyelitis Weeks Of Treatment: 0 Clustered Wound: No Pending Amputation On Presentation Photos Wound Measurements Length: (cm) 4 Width: (cm) 2 Depth: (cm) 0.2 Area: (cm) 6.283 Volume: (cm) 1.257 % Reduction in Area: % Reduction in Volume: Epithelialization: None Tunneling: No Undermining: No Wound Description Classification: Grade 1 Exudate Amount: Medium Exudate Type: Serosanguineous Exudate Color: red, brown Foul Odor After Cleansing: No Slough/Fibrino Yes Wound Bed Granulation Amount: Medium (34-66%) Exposed Structure Granulation Quality: Pink Fascia Exposed: No Necrotic Amount: Medium (34-66%) Fat Layer (Subcutaneous Tissue) Exposed: Yes Necrotic Quality: Adherent Slough Tendon Exposed: No Muscle Exposed: No Joint Exposed: No [name], [name] ([id number]) [id number] Page 9 of 12 Bone Exposed: No Treatment Notes Wound #6 (Metatarsal head first) Wound Laterality: Left Cleanser Soap and Water Discharge Instruction: Gently cleanse wound with antibacterial soap, rinse and pat dry prior to dressing wounds Peri-Wound Care Topical Primary Dressing Hydrofera Blue  Ready Transfer Foam, 4x5 (in/in) Discharge Instruction: Apply Hydrofera Blue Ready to wound bed as directed Secondary Dressing Foam Dressing, 4x4 (in/in) Discharge Instruction: alleyn heel cup Secured With ACE WRAP - 11M ACE Elastic Bandage With VELCRO Brand Closure, 4 (in) Compression Wrap Compression Stockings Add-Ons Electronic Signature(s) Signed: 09/30/2022 9:53:20 AM By: [name] RN Entered By: [name] on 09/30/2022 09:03:37 -------------------------------------------------------------------------------- Wound Assessment Details Patient Name: Date of Service: [name] 09/30/2022 8:45 A M Medical Record Number: [id number] Patient Account Number: [id number] Date of Birth/Sex: Treating RN: 1980/10/01 (41 y.o. [name]) [name] Primary Care Xzavien Harada: [name] Other Clinician: Referring Quintessa Simmerman: Treating Jadrian Bulman/Extender: [name], [name] [name], [name] in Treatment: 0 Wound Status Wound Number: 7 Primary Diabetic Wound/Ulcer of the Lower Extremity Etiology: Wound Location: Left Metatarsal head fourth Wound Status: Open Wounding Event: Gradually Appeared Comorbid Hypertension, Peripheral Venous Disease, Type II Diabetes, Date Acquired: 02/22/2022 History: Osteomyelitis Weeks Of Treatment: 0 Clustered Wound: No Pending Amputation On Presentation Photos Wound Measurements Length: (cm) 2.5 Width: (cm) 3.3 Depth: (cm) 0.2 Area: (cm) 6.48 Volume: (cm) 1.296 Rueda, Vanessa [name] ([id number]) % Reduction in Area: % Reduction in Volume: Epithelialization: None Tunneling: No Undermining: No [id number] Page 10 of 12 Wound Description Classification: Grade 1 Exudate Amount: Medium Exudate Type: Serosanguineous Exudate Color: red, brown Foul Odor After Cleansing: No Slough/Fibrino Yes Wound Bed Granulation Amount: Medium (34-66%) Exposed Structure Granulation Quality: Pink Fascia Exposed: No Necrotic  Amount: Medium (34-66%) Fat Layer (Subcutaneous Tissue) Exposed: Yes Necrotic Quality: Adherent Slough Tendon Exposed: No Muscle Exposed: No Joint Exposed: No Bone Exposed: No Treatment Notes Wound #7 (Metatarsal head fourth) Wound Laterality: Left Cleanser Soap and Water Discharge Instruction: Gently cleanse wound with antibacterial soap, rinse and pat dry prior to dressing wounds Peri-Wound Care Topical Primary Dressing Hydrofera Blue Ready Transfer Foam, 4x5 (in/in) Discharge Instruction: Apply Hydrofera Blue Ready to wound bed as directed Secondary Dressing Foam Dressing, 4x4 (in/in) Discharge Instruction: alleyn heel cup Secured With ACE WRAP - 11M ACE Elastic Bandage With VELCRO Brand Closure, 4 (in) Compression Wrap Compression Stockings Add-Ons Electronic Signature(s) Signed: 09/30/2022 9:53:20 AM By: [name] RN Entered By: [name] on 09/30/2022 09:04:49 -------------------------------------------------------------------------------- Wound Assessment Details Patient Name: Date of Service: [name] 09/30/2022 8:45  A M Medical Record Number: [id number] Patient Account Number: [id number] Date of Birth/Sex: Treating RN: 04/08/1981 (41 y.o. [name]) [name] Primary Care Kaymon Denomme: [name] Other Clinician: Referring Lilyian Quayle: Treating Kaycee Haycraft/Extender: [name], [name] [name], [name] in Treatment: 0 Wound Status Wound Number: 8 Primary Diabetic Wound/Ulcer of the Lower Extremity Etiology: Wound Location: Left, Lateral Metatarsal head fifth Wound Status: Open Wounding Event: Gradually Appeared Comorbid Hypertension, Peripheral Venous Disease, Type II Diabetes, Date Acquired: 05/25/2022 History: Osteomyelitis Weeks Of Treatment: 0 Clustered Wound: No Pending Amputation On Presentation Photos [name], [name] ([id number]) [id number] Page 11 of 12 Wound Measurements Length: (cm) 0.8 Width: (cm)  1 Depth: (cm) 0.2 Area: (cm) 0.628 Volume: (cm) 0.126 % Reduction in Area: % Reduction in Volume: Epithelialization: None Tunneling: No Undermining: No Wound Description Classification: Grade 1 Exudate Amount: Medium Exudate Type: Serosanguineous Exudate Color: red, brown Foul Odor After Cleansing: No Slough/Fibrino Yes Wound Bed Granulation Amount: Large (67-100%) Exposed Structure Granulation Quality: Pink Fascia Exposed: No Necrotic Amount: Small (1-33%) Fat Layer (Subcutaneous Tissue) Exposed: Yes Necrotic Quality: Adherent Slough Tendon Exposed: No Muscle Exposed: No Joint Exposed: No Bone Exposed: No Treatment Notes Wound #8 (Metatarsal head fifth) Wound Laterality: Left, Lateral Cleanser Soap and Water Discharge Instruction: Gently cleanse wound with antibacterial soap, rinse and pat dry prior to dressing wounds Peri-Wound Care Topical Primary Dressing Hydrofera Blue Ready Transfer Foam, 4x5 (in/in) Discharge Instruction: Apply Hydrofera Blue Ready to wound bed as directed Secondary Dressing Foam Dressing, 4x4 (in/in) Discharge Instruction: alleyn heel cup Secured With ACE WRAP - 109M ACE Elastic Bandage With VELCRO Brand Closure, 4 (in) Compression Wrap Compression Stockings Add-Ons Electronic Signature(s) Signed: 09/30/2022 9:53:20 AM By: [name] RN Entered By: [name] on 09/30/2022 09:06:02 -------------------------------------------------------------------------------- Vitals Details Patient Name: Date of Service: [name] 09/30/2022 8:45 A M Medical Record Number: [id number] Patient Account Number: [id number] Date of Birth/Sex: Treating RN: 13-Sep-1980 ([address] y.o. [place], [place], [place] [name] ([id number]) [id number] Page 12 of 12 Primary Care Laurelyn Terrero: [name] Other Clinician: Referring Derrian Poli: Treating Fergus Throne/Extender: [name], [name] [name], [name] in Treatment: 0 Vital  Signs Time Taken: 08:54 Temperature (F): 97.9 Height (in): 73 Pulse (bpm): 99 Source: Stated Respiratory Rate (breaths/min): 18 Weight (lbs): 330 Blood Pressure (mmHg): 136/83 Source: Stated Reference Range: 80 - 120 mg / dl Body Mass Index (BMI): 43.5 Electronic Signature(s) Signed: 09/30/2022 9:53:20 AM By: [name] RN Entered By: [name] on 09/30/2022 08:55:22

## 2022-09-30 NOTE — Progress Notes (Signed)
KEN, CAMBRAY ([id number]) [phone] [id number] Page 1 of 4 Visit Report for 09/30/2022 Abuse Risk Screen Details Patient Name: Date of Service: [name], [name] 09/30/2022 8:45 A M Medical Record Number: [id number] Patient Account Number: [id number] Date of Birth/Sex: Treating RN: 1980-10-12 (42 y.o. [name]) [name] Primary Care Ysabel Cowgill: [name] Other Clinician: Referring Meelah Tallo: Treating Milea Klink/Extender: [name], [name] [name], [name] in Treatment: 0 Abuse Risk Screen Items Answer ABUSE RISK SCREEN: Has anyone close to you tried to hurt or harm you recentlyo No Do you feel uncomfortable with anyone in your familyo No Has anyone forced you do things that you didnt want to doo No Electronic Signature(s) Signed: 09/30/2022 9:53:20 AM By: [name] RN Entered By: [name] on 09/30/2022 08:56:15 -------------------------------------------------------------------------------- Activities of Daily Living Details Patient Name: Date of Service: [name], [name] 09/30/2022 8:45 A M Medical Record Number: [id number] Patient Account Number: [id number] Date of Birth/Sex: Treating RN: Oct 07, 1980 (42 y.o. [name] Primary Care Arryana Tolleson: [name] Other Clinician: Referring Tierria Watson: Treating Nicle Connole/Extender: [name], [name] [name], [name] in Treatment: 0 Activities of Daily Living Items Answer Activities of Daily Living (Please select one for each item) Drive Automobile Completely Able T Medications ake Completely Able Use T elephone Completely Able Care for Appearance Completely Able Use T oilet Completely Able Bath / Shower Completely Able Dress Self Completely Able Feed Self Completely Able Walk Completely Able Get In / Out Bed Completely Able Housework Completely Able Prepare Meals Completely Able Handle Money Completely Able Shop for Self Completely Able Electronic  Signature(s) Signed: 09/30/2022 9:53:20 AM By: [name] RN Entered By: [name] on 09/30/2022 08:56:42 -------------------------------------------------------------------------------- Education Screening Details Patient Name: Date of Service: [name] 09/30/2022 8:45 A M Medical Record Number: [id number] Patient Account Number: [id number] Date of Birth/Sex: Treating RN: August 03, 1980 (42 y.o. [name] Primary Care Suzane Vanderweide: [name] Other Clinician: Referring Altus Zaino: Treating Amond Speranza/Extender: Chauncey Mann, [name] [name], [name] in Treatment: 0 BREYLIN, CARLYON ([id number]) [phone] [id number] Page 2 of 4 Primary Learner Assessed: Patient Learning Preferences/Education Level/Primary Language Learning Preference: Explanation Highest Education Level: High School Preferred Language: English Cognitive Barrier Language Barrier: No Translator Needed: No Memory Deficit: No Emotional Barrier: No Cultural/Religious Beliefs Affecting Medical Care: No Physical Barrier Impaired Vision: No Impaired Hearing: No Decreased Hand dexterity: No Knowledge/Comprehension Knowledge Level: Medium Comprehension Level: Medium Ability to understand written instructions: Medium Ability to understand verbal instructions: Medium Motivation Anxiety Level: Anxious Cooperation: Cooperative Education Importance: Acknowledges Need Interest in Health Problems: Asks Questions Perception: Coherent Willingness to Engage in Self-Management High Activities: Readiness to Engage in Self-Management High Activities: Electronic Signature(s) Signed: 09/30/2022 9:53:20 AM By: [name] RN Entered By: [name] on 09/30/2022 08:57:04 -------------------------------------------------------------------------------- Fall Risk Assessment Details Patient Name: Date of Service: [name] 09/30/2022 8:45 A M Medical Record Number:  [id number] Patient Account Number: [id number] Date of Birth/Sex: Treating RN: 1980-11-24 (42 y.o. [name]) [name] Primary Care Jhoselyn Ruffini: [name] Other Clinician: Referring Trinadee Verhagen: Treating Shanyia Stines/Extender: [name], [name] in Treatment: 0 Fall Risk Assessment Items Have you had 2 or more falls in the last 12 monthso 0 No Have you had any fall that resulted in injury in the last 12 monthso 0 No FALLS RISK SCREEN History of falling - immediate or within 3 months 0 No Secondary diagnosis (Do you have 2 or more medical diagnoseso) 0 No Ambulatory aid None/bed  rest/wheelchair/nurse 0 Yes Crutches/cane/walker 0 No Furniture 0 No Intravenous therapy Access/Saline/Heparin Lock 0 No Gait/Transferring Normal/ bed rest/ wheelchair 0 Yes Weak (short steps with or without shuffle, stooped but able to lift head while walking, may seek 0 No support from furniture) Impaired (short steps with shuffle, may have difficulty arising from chair, head down, impaired 0 No balance) Mental Status Oriented to own ability 0 Yes Overestimates or forgets limitations 0 No Risk Level: Low Risk Score: 0 [name], [name] ([id number]) [id number] Page 3 of 4 Electronic Signature(s) -------------------------------------------------------------------------------- Foot Assessment Details Patient Name: Date of Service: [name], [name] 09/30/2022 8:45 A M Medical Record Number: [id number] Patient Account Number: [id number] Date of Birth/Sex: Treating RN: July 25, 1980 (42 y.o. [name]) [name] Primary Care Yogesh Cominsky: [name] Other Clinician: Referring Kissy Cielo: Treating Mahdiya Mossberg/Extender: [name], [name] [name], [name] in Treatment: 0 Foot Assessment Items Site Locations + = Sensation present, - = Sensation absent, [name] = Callus, U = Ulcer R = Redness, W = Warmth, M = Maceration, PU = Pre-ulcerative lesion F = Fissure,  S = Swelling, D = Dryness Assessment Right: Left: Other Deformity: No No Prior Foot Ulcer: No Yes Prior Amputation: No Yes Charcot Joint: No No Ambulatory Status: Ambulatory Without Help Gait: Steady Electronic Signature(s) Signed: 09/30/2022 9:53:20 AM By: [name] RN Entered By: [name] on 09/30/2022 08:59:59 -------------------------------------------------------------------------------- Nutrition Risk Screening Details Patient Name: Date of Service: [name], [name] 09/30/2022 8:45 A M Medical Record Number: [id number] Patient Account Number: [id number] Date of Birth/Sex: Treating RN: 28-Sep-1980 (42 y.o. [name] Primary Care Aleyda Gindlesperger: [name] Other Clinician: Referring Ilaisaane Marts: Treating Sharnette Kitamura/Extender: [name], [name] [name], [name] in Treatment: 0 Height (in): 73 Weight (lbs): 330 Body Mass Index (BMI): 43.5 [name], [name] [id number] Page 4 of 4 Nutrition Risk Screening Items Score Screening NUTRITION RISK SCREEN: I have an illness or condition that made me change the kind and/or amount of food I eat 2 Yes I eat fewer than two meals per day 0 No I eat few fruits and vegetables, or milk products 0 No I have three or more drinks of beer, liquor or wine almost every day 0 No I have tooth or mouth problems that make it hard for me to eat 0 No I don't always have enough money to buy the food I need 0 No I eat alone most of the time 1 Yes I take three or more different prescribed or over-the-counter drugs a day 0 No Without wanting to, I have lost or gained 10 pounds in the last six months 0 No I am not always physically able to shop, cook and/or feed myself 0 No Nutrition Protocols Good Risk Protocol Moderate Risk Protocol 0 Provide education on nutrition High Risk Proctocol Risk Level: Moderate Risk Score: 3 Electronic Signature(s) Signed: 09/30/2022 9:53:20 AM By: [name] RN Entered By: [name] on 09/30/2022 08:57:32

## 2022-10-02 NOTE — Progress Notes (Signed)
SERGE, BAYERL ([id number]) [id number] Page 1 of 9 Visit Report for 09/30/2022 Chief Complaint Document Details Patient Name: Date of Service: [name], [name] 09/30/2022 8:45 A M Medical Record Number: [id number] Patient Account Number: [id number] Date of Birth/Sex: Treating RN: [birth year] (41 y.o. [name]) [name] Primary Care Provider: [name] Other Clinician: Referring Provider: Treating Provider/Extender: RO BSO [name], [name] in Treatment: 0 Information Obtained from: Patient Chief Complaint 05/13/2022; bilateral feet wounds 09/30/2022; patient returns to clinic with wounds on his left plantar foot Electronic Signature(s) Signed: 09/30/2022 4:41:35 PM By: Baltazar Najjar MD Entered By: Baltazar Najjar on 09/30/2022 10:47:19 -------------------------------------------------------------------------------- Debridement Details Patient Name: Date of Service: [name] 09/30/2022 8:45 A M Medical Record Number: [id number] Patient Account Number: [id number] Date of Birth/Sex: Treating RN: [birth year] (41 y.o. [name] Primary Care Provider: [name] Other Clinician: Referring Provider: Treating Provider/Extender: Chauncey Mann, [name] in Treatment: 0 Debridement Performed for Assessment: Wound #6 Left Metatarsal head first Performed By: Physician Maxwell Caul, MD Debridement Type: Chemical/Enzymatic/Mechanical Agent Used: saline gauze Severity of Tissue Pre Debridement: Fat layer exposed Level of Consciousness (Pre-procedure): Awake and Alert Pre-procedure Verification/Time Out No Taken: Start Time: 09:15 Percent of Wound Bed Debrided: Instrument: Other : saline gauze Bleeding: Minimum Hemostasis Achieved: Pressure End Time: 09:20 Procedural Pain: 0 Post Procedural Pain: 0 Response to Treatment: Procedure was tolerated well Level of Consciousness (Post- Awake  and Alert procedure): Post Debridement Measurements of Total Wound Length: (cm) 4 Width: (cm) 2 Depth: (cm) 0.2 Volume: (cm) 1.257 Character of Wound/Ulcer Post Debridement: Requires Further Debridement Severity of Tissue Post Debridement: Fat layer exposed Post Procedure Diagnosis Same as Pre-procedure Electronic Signature(s) Signed: 09/30/2022 10:04:38 AM By: [name] RN Signed: 09/30/2022 4:41:35 PM By: Baltazar Najjar MD Entered By: [name] on 09/30/2022 10:04:38 [name] ([id number]) [id number] Page 2 of 9 -------------------------------------------------------------------------------- Debridement Details Patient Name: Date of Service: [name], [name] 09/30/2022 8:45 A M Medical Record Number: [id number] Patient Account Number: [id number] Date of Birth/Sex: Treating RN: [birth year] (41 y.o. [name]) [name] Primary Care Provider: [name] Other Clinician: Referring Provider: Treating Provider/Extender: RO BSO [name], [name] [name], [name] in Treatment: 0 Debridement Performed for Assessment: Wound #7 Left Metatarsal head fourth Performed By: Physician Maxwell Caul, MD Debridement Type: Chemical/Enzymatic/Mechanical Agent Used: saline gauze Severity of Tissue Pre Debridement: Fat layer exposed Level of Consciousness (Pre-procedure): Awake and Alert Pre-procedure Verification/Time Out No Taken: Start Time: 09:15 Percent of Wound Bed Debrided: Instrument: Other : saline gauze Bleeding: Minimum Hemostasis Achieved: Pressure End Time: 09:20 Procedural Pain: 0 Post Procedural Pain: 0 Response to Treatment: Procedure was tolerated well Level of Consciousness (Post- Awake and Alert procedure): Post Debridement Measurements of Total Wound Length: (cm) 2.5 Width: (cm) 3.3 Depth: (cm) 0.2 Volume: (cm) 1.296 Character of Wound/Ulcer Post Debridement: Requires Further Debridement Severity of Tissue Post  Debridement: Fat layer exposed Post Procedure Diagnosis Same as Pre-procedure Electronic Signature(s) Signed: 09/30/2022 10:05:14 AM By: [name] RN Signed: 09/30/2022 4:41:35 PM By: Baltazar Najjar MD Entered By: [name] on 09/30/2022 10:05:14 -------------------------------------------------------------------------------- Debridement Details Patient Name: Date of Service: [name] 09/30/2022 8:45 A M Medical Record Number: [id number] Patient Account Number: [id number] Date of Birth/Sex: Treating RN: 01-06-1981 (41 y.o. [name] Primary Care Provider: [name] Other Clinician: Referring Provider: Treating Provider/Extender: [name], [name] in Treatment: 0 Debridement Performed for Assessment:  Wound #8 Left,Lateral Metatarsal head fifth Performed By: Physician Maxwell Caul, MD Debridement Type: Chemical/Enzymatic/Mechanical Agent Used: saline gauze Severity of Tissue Pre Debridement: Fat layer exposed Level of Consciousness (Pre-procedure): Awake and Alert Pre-procedure Verification/Time Out No Taken: Start Time: 09:15 Percent of Wound Bed Debrided: Instrument: Other : saline gauze Bleeding: Minimum Hemostasis Achieved: Pressure End Time: 09:20 Procedural Pain: 0 Post Procedural Pain: 0 Response to Treatment: Procedure was tolerated well Level of Consciousness (Post- Awake and Alert procedure): [name], [name] ([id number]) [id number] Page 3 of 9 Post Debridement Measurements of Total Wound Length: (cm) 0.8 Width: (cm) 1 Depth: (cm) 0.2 Volume: (cm) 0.126 Character of Wound/Ulcer Post Debridement: Requires Further Debridement Severity of Tissue Post Debridement: Fat layer exposed Post Procedure Diagnosis Same as Pre-procedure Electronic Signature(s) Signed: 09/30/2022 10:05:38 AM By: [name] RN Signed: 09/30/2022 4:41:35 PM By: Baltazar Najjar MD Entered By: [name]  on 09/30/2022 10:05:38 -------------------------------------------------------------------------------- HPI Details Patient Name: Date of Service: [name] 09/30/2022 8:45 A M Medical Record Number: [id number] Patient Account Number: [id number] Date of Birth/Sex: Treating RN: [birth year] (41 y.o. [name] Primary Care Provider: [name] Other Clinician: Referring Provider: Treating Provider/Extender: [name], [name] in Treatment: 0 History of Present Illness HPI Description: 05/13/2022 Mr. Randale Colton is a 42 year old male with uncontrolled insulin-dependent type 2 diabetes with last hemoglobin A1c of 11.1, previous feet wounds that led to bilateral great toe amputations, ADHD and bipolar depression that presents the clinic for a 1 month history of non healing ulcers to the Feet bilaterally. His previous bilateral great toe amputation sites have healed. He has been using an antibacterial spray to keep the areas clean. He has not been dressing the wounds. He states that over the past couple days he has had increased redness and warmth to the right foot. He has regular tennis shoes. He does not use any offloading device. He states he walks around in socks and does not go barefoot. 05/20/2022. This is a 42 year old man with type 2 diabetes and neuropathy. He has wounds on his right first metatarsal head and the base of his right fourth and fifth toes. He has been using Medihoney. Apparently there was a plan to have him casted today. He has had previously amputated first toes bilaterally a year to a year and a half ago by Dr. Lajoyce Corners for underlying osteomyelitis according to the patient. The patient has not had any foot wear. Because his forefoot is so wide he simply ordered his shoes online. Last week because of redness and warmth on the left foot he was put on Augmentin and doxycycline READMISSION 09/30/2022 This is a 42 year old man who is  a type II diabetic with diabetic peripheral neuropathy. He was here for the clinic in the clinic in late September 2023 for 2 visits only and on the left foot had very similar wounds to currently. He tells [name] he left the clinic related to a problem with his underlying bipolar disorder. In any case he still has an area on the left first metatarsal head and at the base of the fourth and fifth toes. This is very similar by my memory to what was present in December 2023. He tells [name] that last week or 2 this became somewhat red and warm. He used doxycycline and amoxicillin for 5 days that he had leftover from a previous visit and that helped. At this point I am not sure about his diabetes control. He had  peg assist shoes last time he was here he has not been wearing these Past medical history includes type 2 diabetes with peripheral neuropathy, and bilateral great toe amputations, ADHD, bipolar disorder, ABIs in our clinic were 0.78 on the right he does not have wounds here and 1.03 on the left which is his wounded side Electronic Signature(s) Signed: 09/30/2022 4:41:35 PM By: Baltazar Najjar MD Entered By: Baltazar Najjar on 09/30/2022 10:50:17 -------------------------------------------------------------------------------- Physical Exam Details Patient Name: Date of Service: [name] 09/30/2022 8:45 A M Medical Record Number: [id number] Patient Account Number: [id number] Date of Birth/Sex: Treating RN: Apr 23, 1981 (41 y.o. [name] Primary Care Provider: [name] Other Clinician: Referring Provider: Treating Provider/Extender: Chauncey Mann, [name] in Treatment: 0 [name], [name] ([id number]) [id number] Page 4 of 9 Constitutional Sitting or standing Blood Pressure is within target range for patient.. Pulse regular and within target range for patient.Marland Kitchen Respirations regular, non-labored and within target range..  Temperature is normal and within the target range for the patient.Marland Kitchen appears in no distress. Cardiovascular Pedal pulses palpable on the left. Notes Wound exam; left foot first metatarsal head. This has surrounding callus some epithelialization over a very dry wound bed. He has deeper areas on the base of the left fourth toe and left fifth toe. These are more open Electronic Signature(s) Signed: 09/30/2022 4:41:35 PM By: Baltazar Najjar MD Entered By: Baltazar Najjar on 09/30/2022 10:52:39 -------------------------------------------------------------------------------- Physician Orders Details Patient Name: Date of Service: [name] 09/30/2022 8:45 A M Medical Record Number: [id number] Patient Account Number: [id number] Date of Birth/Sex: Treating RN: Jul 06, 1980 (41 y.o. [name] Primary Care Provider: [name] Other Clinician: Referring Provider: Treating Provider/Extender: RO BSO [name], [name] in Treatment: 0 Verbal / Phone Orders: No Diagnosis Coding Follow-up Appointments Return Appointment in 1 week. Bathing/ [employer] wounds with antibacterial soap and water. Edema Control - Lymphedema / Segmental Compressive Device / Other Elevate, Exercise Daily and A void Standing for Long Periods of Time. Elevate legs to the level of the heart and pump ankles as often as possible Elevate leg(s) parallel to the floor when sitting. Off-Loading Open toe surgical shoe with peg assist. - left Turn and reposition every 2 hours Wound Treatment Wound #6 - Metatarsal head first Wound Laterality: Left Cleanser: Soap and Water 3 x Per Week/30 Days Discharge Instructions: Gently cleanse wound with antibacterial soap, rinse and pat dry prior to dressing wounds Prim Dressing: Hydrofera Blue Ready Transfer Foam, 4x5 (in/in) 3 x Per Week/30 Days ary Discharge Instructions: Apply Hydrofera Blue Ready to wound bed as directed Secondary Dressing:  Foam Dressing, 4x4 (in/in) 3 x Per Week/30 Days Discharge Instructions: alleyn heel cup Secured With: ACE WRAP - 60M ACE Elastic Bandage With VELCRO Brand Closure, 4 (in) 3 x Per Week/30 Days Wound #7 - Metatarsal head fourth Wound Laterality: Left Cleanser: Soap and Water 3 x Per Week/30 Days Discharge Instructions: Gently cleanse wound with antibacterial soap, rinse and pat dry prior to dressing wounds Prim Dressing: Hydrofera Blue Ready Transfer Foam, 4x5 (in/in) 3 x Per Week/30 Days ary Discharge Instructions: Apply Hydrofera Blue Ready to wound bed as directed Secondary Dressing: Foam Dressing, 4x4 (in/in) 3 x Per Week/30 Days Discharge Instructions: alleyn heel cup Secured With: ACE WRAP - 60M ACE Elastic Bandage With VELCRO Brand Closure, 4 (in) 3 x Per Week/30 Days Wound #8 - Metatarsal head fifth Wound Laterality: Left, Lateral Cleanser: Soap and Water  3 x Per Week/30 Days Discharge Instructions: Gently cleanse wound with antibacterial soap, rinse and pat dry prior to dressing wounds [name], [name] ([id number]) [id number] Page 5 of 9 Prim Dressing: Hydrofera Blue Ready Transfer Foam, 4x5 (in/in) 3 x Per Week/30 Days ary Discharge Instructions: Apply Hydrofera Blue Ready to wound bed as directed Secondary Dressing: Foam Dressing, 4x4 (in/in) 3 x Per Week/30 Days Discharge Instructions: alleyn heel cup Secured With: ACE WRAP - 24M ACE Elastic Bandage With VELCRO Brand Closure, 4 (in) 3 x Per Week/30 Days Electronic Signature(s) Signed: 09/30/2022 4:41:35 PM By: Baltazar Najjar MD Signed: 10/02/2022 9:26:52 AM By: [name] RN Previous Signature: 09/30/2022 9:53:20 AM Version By: [name] RN Entered By: [name] on 09/30/2022 10:01:39 -------------------------------------------------------------------------------- Problem List Details Patient Name: Date of Service: [name] 09/30/2022 8:45 A M Medical Record Number:  [id number] Patient Account Number: [id number] Date of Birth/Sex: Treating RN: 02-Feb-1981 (41 y.o. [name] Primary Care Provider: [name] Other Clinician: Referring Provider: Treating Provider/Extender: [name], [name] [name], [name] in Treatment: 0 Active Problems ICD-10 Encounter Code Description Active Date MDM Diagnosis E11.621 Type 2 diabetes mellitus with foot ulcer 09/30/2022 No Yes L97.528 Non-pressure chronic ulcer of other part of left foot with other specified 09/30/2022 No Yes severity E11.40 Type 2 diabetes mellitus with diabetic neuropathy, unspecified 09/30/2022 No Yes Inactive Problems Resolved Problems Electronic Signature(s) Signed: 09/30/2022 4:41:35 PM By: Baltazar Najjar MD Entered By: Baltazar Najjar on 09/30/2022 10:46:28 -------------------------------------------------------------------------------- Progress Note Details Patient Name: Date of Service: [name] 09/30/2022 8:45 A M Medical Record Number: [id number] Patient Account Number: [id number] Date of Birth/Sex: Treating RN: Mar 21, 1981 (41 y.o. [name] Primary Care Provider: [name] Other Clinician: Referring Provider: Treating Provider/Extender: [name], [name] in Treatment: 0 Subjective Chief Complaint Information obtained from Patient 05/13/2022; bilateral feet wounds 09/30/2022; patient returns to clinic with wounds on his left plantar foot [name], [name] ([id number]) [id number] Page 6 of 9 History of Present Illness (HPI) 05/13/2022 Mr. Jante Valbuena is a 42 year old male with uncontrolled insulin-dependent type 2 diabetes with last hemoglobin A1c of 11.1, previous feet wounds that led to bilateral great toe amputations, ADHD and bipolar depression that presents the clinic for a 1 month history of non healing ulcers to the Feet bilaterally. His previous bilateral great toe amputation  sites have healed. He has been using an antibacterial spray to keep the areas clean. He has not been dressing the wounds. He states that over the past couple days he has had increased redness and warmth to the right foot. He has regular tennis shoes. He does not use any offloading device. He states he walks around in socks and does not go barefoot. 05/20/2022. This is a 42 year old man with type 2 diabetes and neuropathy. He has wounds on his right first metatarsal head and the base of his right fourth and fifth toes. He has been using Medihoney. Apparently there was a plan to have him casted today. He has had previously amputated first toes bilaterally a year to a year and a half ago by Dr. Lajoyce Corners for underlying osteomyelitis according to the patient. The patient has not had any foot wear. Because his forefoot is so wide he simply ordered his shoes online. Last week because of redness and warmth on the left foot he was put on Augmentin and doxycycline READMISSION 09/30/2022 This is a 42 year old man who is a type II diabetic with  diabetic peripheral neuropathy. He was here for the clinic in the clinic in late September 2023 for 2 visits only and on the left foot had very similar wounds to currently. He tells [name] he left the clinic related to a problem with his underlying bipolar disorder. In any case he still has an area on the left first metatarsal head and at the base of the fourth and fifth toes. This is very similar by my memory to what was present in December 2023. He tells [name] that last week or 2 this became somewhat red and warm. He used doxycycline and amoxicillin for 5 days that he had leftover from a previous visit and that helped. At this point I am not sure about his diabetes control. He had peg assist shoes last time he was here he has not been wearing these Past medical history includes type 2 diabetes with peripheral neuropathy, and bilateral great toe amputations, ADHD, bipolar  disorder, ABIs in our clinic were 0.78 on the right he does not have wounds here and 1.03 on the left which is his wounded side Patient History Allergies milk (Severity: Moderate) Social History Never smoker, Marital Status - Single, Alcohol Use - Never, Drug Use - No History, Caffeine Use - Rarely. Medical History Cardiovascular Patient has history of Hypertension, Peripheral Venous Disease Endocrine Patient has history of Type II Diabetes Musculoskeletal Patient has history of Osteomyelitis Objective Constitutional Sitting or standing Blood Pressure is within target range for patient.. Pulse regular and within target range for patient.Marland Kitchen Respirations regular, non-labored and within target range.. Temperature is normal and within the target range for the patient.Marland Kitchen appears in no distress. Vitals Time Taken: 8:54 AM, Height: 73 in, Source: Stated, Weight: 330 lbs, Source: Stated, BMI: 43.5, Temperature: 97.9 F, Pulse: 99 bpm, Respiratory Rate: 18 breaths/min, Blood Pressure: 136/83 mmHg. Cardiovascular Pedal pulses palpable on the left. General Notes: Wound exam; left foot first metatarsal head. This has surrounding callus some epithelialization over a very dry wound bed. He has deeper areas on the base of the left fourth toe and left fifth toe. These are more open Integumentary (Hair, Skin) Wound #6 status is Open. Original cause of wound was Gradually Appeared. The date acquired was: 02/22/2022. The wound is located on the Left Metatarsal head first. The wound measures 4cm length x 2cm width x 0.2cm depth; 6.283cm^2 area and 1.257cm^3 volume. There is Fat Layer (Subcutaneous Tissue) exposed. There is no tunneling or undermining noted. There is a medium amount of serosanguineous drainage noted. There is medium (34-66%) pink granulation within the wound bed. There is a medium (34-66%) amount of necrotic tissue within the wound bed including Adherent Slough. Wound #7 status is Open.  Original cause of wound was Gradually Appeared. The date acquired was: 02/22/2022. The wound is located on the Left Metatarsal head fourth. The wound measures 2.5cm length x 3.3cm width x 0.2cm depth; 6.48cm^2 area and 1.296cm^3 volume. There is Fat Layer (Subcutaneous Tissue) exposed. There is no tunneling or undermining noted. There is a medium amount of serosanguineous drainage noted. There is medium (34-66%) pink granulation within the wound bed. There is a medium (34-66%) amount of necrotic tissue within the wound bed including Adherent Slough. Wound #8 status is Open. Original cause of wound was Gradually Appeared. The date acquired was: 05/25/2022. The wound is located on the Left,Lateral Metatarsal head fifth. The wound measures 0.8cm length x 1cm width x 0.2cm depth; 0.628cm^2 area and 0.126cm^3 volume. There is Fat Layer (Subcutaneous Tissue)  exposed. There is no tunneling or undermining noted. There is a medium amount of serosanguineous drainage noted. There is large (67- 100%) pink granulation within the wound bed. There is a small (1-33%) amount of necrotic tissue within the wound bed including Adherent Slough. [name], [name] ([id number]) [id number] Page 7 of 9 Assessment Active Problems ICD-10 Type 2 diabetes mellitus with foot ulcer Non-pressure chronic ulcer of other part of left foot with other specified severity Type 2 diabetes mellitus with diabetic neuropathy, unspecified Procedures Wound #6 Pre-procedure diagnosis of Wound #6 is a Diabetic Wound/Ulcer of the Lower Extremity located on the Left Metatarsal head first .Severity of Tissue Pre Debridement is: Fat layer exposed. There was a Chemical/Enzymatic/Mechanical debridement performed by Maxwell Caul, MD. With the following instrument(s): saline gauze. Other agent used was saline gauze. A Minimum amount of bleeding was controlled with Pressure. The procedure was tolerated well with a pain  level of 0 throughout and a pain level of 0 following the procedure. Post Debridement Measurements: 4cm length x 2cm width x 0.2cm depth; 1.257cm^3 volume. Character of Wound/Ulcer Post Debridement requires further debridement. Severity of Tissue Post Debridement is: Fat layer exposed. Post procedure Diagnosis Wound #6: Same as Pre-Procedure Wound #7 Pre-procedure diagnosis of Wound #7 is a Diabetic Wound/Ulcer of the Lower Extremity located on the Left Metatarsal head fourth .Severity of Tissue Pre Debridement is: Fat layer exposed. There was a Chemical/Enzymatic/Mechanical debridement performed by Maxwell Caul, MD. With the following instrument(s): saline gauze. Other agent used was saline gauze. A Minimum amount of bleeding was controlled with Pressure. The procedure was tolerated well with a pain level of 0 throughout and a pain level of 0 following the procedure. Post Debridement Measurements: 2.5cm length x 3.3cm width x 0.2cm depth; 1.296cm^3 volume. Character of Wound/Ulcer Post Debridement requires further debridement. Severity of Tissue Post Debridement is: Fat layer exposed. Post procedure Diagnosis Wound #7: Same as Pre-Procedure Wound #8 Pre-procedure diagnosis of Wound #8 is a Diabetic Wound/Ulcer of the Lower Extremity located on the Left,Lateral Metatarsal head fifth .Severity of Tissue Pre Debridement is: Fat layer exposed. There was a Chemical/Enzymatic/Mechanical debridement performed by Maxwell Caul, MD. With the following instrument(s): saline gauze. Other agent used was saline gauze. A Minimum amount of bleeding was controlled with Pressure. The procedure was tolerated well with a pain level of 0 throughout and a pain level of 0 following the procedure. Post Debridement Measurements: 0.8cm length x 1cm width x 0.2cm depth; 0.126cm^3 volume. Character of Wound/Ulcer Post Debridement requires further debridement. Severity of Tissue Post Debridement is: Fat layer  exposed. Post procedure Diagnosis Wound #8: Same as Pre-Procedure Plan Follow-up Appointments: Return Appointment in 1 week. Bathing/ Shower/ Hygiene: Wash wounds with antibacterial soap and water. Edema Control - Lymphedema / Segmental Compressive Device / Other: Elevate, Exercise Daily and Avoid Standing for Long Periods of Time. Elevate legs to the level of the heart and pump ankles as often as possible Elevate leg(s) parallel to the floor when sitting. Off-Loading: Open toe surgical shoe with peg assist. - left Turn and reposition every 2 hours WOUND #6: - Metatarsal head first Wound Laterality: Left Cleanser: Soap and Water 3 x Per Week/30 Days Discharge Instructions: Gently cleanse wound with antibacterial soap, rinse and pat dry prior to dressing wounds Prim Dressing: Hydrofera Blue Ready Transfer Foam, 4x5 (in/in) 3 x Per Week/30 Days ary Discharge Instructions: Apply Hydrofera Blue Ready to wound bed as directed Secondary Dressing: Foam Dressing, 4x4 (in/in) 3 x  Per Week/30 Days Discharge Instructions: alleyn heel cup Secured With: ACE WRAP - 5M ACE Elastic Bandage With VELCRO Brand Closure, 4 (in) 3 x Per Week/30 Days WOUND #7: - Metatarsal head fourth Wound Laterality: Left Cleanser: Soap and Water 3 x Per Week/30 Days Discharge Instructions: Gently cleanse wound with antibacterial soap, rinse and pat dry prior to dressing wounds Prim Dressing: Hydrofera Blue Ready Transfer Foam, 4x5 (in/in) 3 x Per Week/30 Days ary Discharge Instructions: Apply Hydrofera Blue Ready to wound bed as directed Secondary Dressing: Foam Dressing, 4x4 (in/in) 3 x Per Week/30 Days Discharge Instructions: alleyn heel cup Secured With: ACE WRAP - 5M ACE Elastic Bandage With VELCRO Brand Closure, 4 (in) 3 x Per Week/30 Days WOUND #8: - Metatarsal head fifth Wound Laterality: Left, Lateral Cleanser: Soap and Water 3 x Per Week/30 Days Discharge Instructions: Gently cleanse wound with antibacterial  soap, rinse and pat dry prior to dressing wounds Prim Dressing: Hydrofera Blue Ready Transfer Foam, 4x5 (in/in) 3 x Per Week/30 Days ary Discharge Instructions: Apply Hydrofera Blue Ready to wound bed as directed Secondary Dressing: Foam Dressing, 4x4 (in/in) 3 x Per Week/30 Days Discharge Instructions: alleyn heel cup [name], [name] ([id number]) [id number] Page 8 of 9 Secured With: ACE WRAP - 5M ACE Elastic Bandage With VELCRO Brand Closure, 4 (in) 3 x Per Week/30 Days 1. We applied Hydrofera Blue to the wound beds used a heel cup to try and pad these areas over the toes and Ace wrap to the area. 2. I have instructed him to go back to the Pegasys shoe which she assures me he has at home 3. He likely is going to require more aggressive left forefoot offloading then he is doing currently. I went over the necessity of this including concerns about infection, amputations etc. 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 Fortunately none of this looks infected Electronic Signature(s) Signed: 09/30/2022 4:41:35 PM By: Baltazar Najjar MD Entered By: Baltazar Najjar on 09/30/2022 10:54:48 -------------------------------------------------------------------------------- ROS/PFSH Details Patient Name: Date of Service: [name] 09/30/2022 8:45 A M Medical Record Number: [id number] Patient Account Number: [id number] Date of Birth/Sex: Treating RN: 1980/12/22 (41 y.o. [name] Primary Care Provider: [name] Other Clinician: Referring Provider: Treating Provider/Extender: RO BSO [name], [name] in Treatment: 0 Cardiovascular Medical History: Positive for: Hypertension; Peripheral Venous Disease Endocrine Medical History: Positive for: Type II Diabetes Time with diabetes: 2000 Treated with: Insulin, Oral agents Blood sugar tested every day:  No Musculoskeletal Medical History: Positive for: Osteomyelitis Immunizations Pneumococcal Vaccine: Received Pneumococcal Vaccination: No Implantable Devices No devices added Family and Social History Never smoker; Marital Status - Single; Alcohol Use: Never; Drug Use: No History; Caffeine Use: Rarely Electronic Signature(s) Signed: 09/30/2022 9:53:20 AM By: [name] RN Signed: 09/30/2022 4:41:35 PM By: Baltazar Najjar MD Entered By: [name] on 09/30/2022 08:56:09 -------------------------------------------------------------------------------- SuperBill Details Patient Name: Date of Service: [name] 09/30/2022 Medical Record Number: [id number] Patient Account Number: [id number] Date of Birth/Sex: Treating RN: 10-23-1980 (41 y.o. [name] Primary Care Provider: [name] Other Clinician: Referring Provider: Treating Provider/Extender: Chauncey Mann, [name] in Treatment: 0 [name], [name] [id number] Page 9 of 9 Diagnosis Coding ICD-10 Codes Code Description E11.621 Type 2 diabetes mellitus with foot ulcer 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rity E11.40 Type 2 diabetes  mellitus with diabetic neuropathy, unspecified Facility Procedures : CPT4 Code: 16109604 Description: 99214 - WOUND CARE VISIT-LEV 4 EST PT Modifier: Quantity: 1 Physician Procedures : CPT4 Code Description Modifier [id number] 99214 - WC PHYS LEVEL 4 - EST PT ICD-10 Diagnosis Description 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rity E11.621 Type 2 diabetes mellitus with foot ulcer E11.40  Type 2 diabetes mellitus with diabetic neuropathy, unspecified Quantity: 1 Electronic Signature(s) Signed: 09/30/2022 4:41:35 PM By: Baltazar Najjar MD Previous Signature: 09/30/2022 10:02:42 AM Version By: [name] RN Entered By:  Baltazar Najjar on 09/30/2022 10:55:14

## 2022-10-07 ENCOUNTER — Encounter (HOSPITAL_BASED_OUTPATIENT_CLINIC_OR_DEPARTMENT_OTHER): Payer: 59 | Admitting: Internal Medicine

## 2022-10-07 DIAGNOSIS — L97528 Non-pressure chronic ulcer of other part of left foot with other specified severity: Secondary | ICD-10-CM

## 2022-10-07 DIAGNOSIS — E11621 Type 2 diabetes mellitus with foot ulcer: Secondary | ICD-10-CM | POA: Diagnosis not present

## 2022-10-07 DIAGNOSIS — E1142 Type 2 diabetes mellitus with diabetic polyneuropathy: Secondary | ICD-10-CM | POA: Diagnosis not present

## 2022-10-07 DIAGNOSIS — F909 Attention-deficit hyperactivity disorder, unspecified type: Secondary | ICD-10-CM | POA: Diagnosis not present

## 2022-10-07 DIAGNOSIS — Z89411 Acquired absence of right great toe: Secondary | ICD-10-CM | POA: Diagnosis not present

## 2022-10-07 DIAGNOSIS — I10 Essential (primary) hypertension: Secondary | ICD-10-CM | POA: Diagnosis not present

## 2022-10-07 DIAGNOSIS — Z794 Long term (current) use of insulin: Secondary | ICD-10-CM | POA: Diagnosis not present

## 2022-10-07 DIAGNOSIS — F319 Bipolar disorder, unspecified: Secondary | ICD-10-CM | POA: Diagnosis not present

## 2022-10-07 DIAGNOSIS — Z89412 Acquired absence of left great toe: Secondary | ICD-10-CM | POA: Diagnosis not present

## 2022-10-21 ENCOUNTER — Encounter (HOSPITAL_BASED_OUTPATIENT_CLINIC_OR_DEPARTMENT_OTHER): Payer: 59 | Admitting: Internal Medicine

## 2022-10-21 DIAGNOSIS — L97528 Non-pressure chronic ulcer of other part of left foot with other specified severity: Secondary | ICD-10-CM | POA: Diagnosis not present

## 2022-10-21 DIAGNOSIS — I10 Essential (primary) hypertension: Secondary | ICD-10-CM | POA: Diagnosis not present

## 2022-10-21 DIAGNOSIS — Z89411 Acquired absence of right great toe: Secondary | ICD-10-CM | POA: Diagnosis not present

## 2022-10-21 DIAGNOSIS — E11621 Type 2 diabetes mellitus with foot ulcer: Secondary | ICD-10-CM | POA: Diagnosis not present

## 2022-10-21 DIAGNOSIS — F319 Bipolar disorder, unspecified: Secondary | ICD-10-CM | POA: Diagnosis not present

## 2022-10-21 DIAGNOSIS — F909 Attention-deficit hyperactivity disorder, unspecified type: Secondary | ICD-10-CM | POA: Diagnosis not present

## 2022-10-21 DIAGNOSIS — Z89412 Acquired absence of left great toe: Secondary | ICD-10-CM | POA: Diagnosis not present

## 2022-10-21 DIAGNOSIS — E1142 Type 2 diabetes mellitus with diabetic polyneuropathy: Secondary | ICD-10-CM | POA: Diagnosis not present

## 2022-10-21 DIAGNOSIS — Z794 Long term (current) use of insulin: Secondary | ICD-10-CM | POA: Diagnosis not present

## 2022-10-28 ENCOUNTER — Encounter: Payer: 59 | Attending: Internal Medicine | Admitting: Internal Medicine

## 2022-10-28 ENCOUNTER — Other Ambulatory Visit (HOSPITAL_COMMUNITY): Payer: Self-pay

## 2022-10-28 DIAGNOSIS — Z89411 Acquired absence of right great toe: Secondary | ICD-10-CM | POA: Diagnosis not present

## 2022-10-28 DIAGNOSIS — F319 Bipolar disorder, unspecified: Secondary | ICD-10-CM | POA: Diagnosis not present

## 2022-10-28 DIAGNOSIS — L97518 Non-pressure chronic ulcer of other part of right foot with other specified severity: Secondary | ICD-10-CM | POA: Diagnosis not present

## 2022-10-28 DIAGNOSIS — F909 Attention-deficit hyperactivity disorder, unspecified type: Secondary | ICD-10-CM | POA: Diagnosis not present

## 2022-10-28 DIAGNOSIS — Z89412 Acquired absence of left great toe: Secondary | ICD-10-CM | POA: Insufficient documentation

## 2022-10-28 DIAGNOSIS — Z794 Long term (current) use of insulin: Secondary | ICD-10-CM | POA: Insufficient documentation

## 2022-10-28 DIAGNOSIS — E1142 Type 2 diabetes mellitus with diabetic polyneuropathy: Secondary | ICD-10-CM | POA: Diagnosis not present

## 2022-10-28 DIAGNOSIS — L97528 Non-pressure chronic ulcer of other part of left foot with other specified severity: Secondary | ICD-10-CM | POA: Insufficient documentation

## 2022-10-28 DIAGNOSIS — E11621 Type 2 diabetes mellitus with foot ulcer: Secondary | ICD-10-CM | POA: Diagnosis not present

## 2022-10-28 DIAGNOSIS — I10 Essential (primary) hypertension: Secondary | ICD-10-CM | POA: Diagnosis not present

## 2022-10-28 MED ORDER — DOXYCYCLINE MONOHYDRATE 100 MG PO CAPS
100.0000 mg | ORAL_CAPSULE | Freq: Two times a day (BID) | ORAL | 0 refills | Status: DC
  Filled 2022-10-28: qty 14, 7d supply, fill #0

## 2022-11-02 ENCOUNTER — Other Ambulatory Visit (HOSPITAL_COMMUNITY): Payer: Self-pay

## 2022-11-02 MED ORDER — AMOXICILLIN 875 MG PO TABS
875.0000 mg | ORAL_TABLET | Freq: Two times a day (BID) | ORAL | 0 refills | Status: AC
  Filled 2022-11-02: qty 14, 7d supply, fill #0

## 2022-11-03 ENCOUNTER — Other Ambulatory Visit: Payer: Self-pay

## 2022-11-04 ENCOUNTER — Other Ambulatory Visit (HOSPITAL_COMMUNITY): Payer: Self-pay

## 2022-11-04 ENCOUNTER — Encounter (HOSPITAL_BASED_OUTPATIENT_CLINIC_OR_DEPARTMENT_OTHER): Payer: 59 | Admitting: Internal Medicine

## 2022-11-04 DIAGNOSIS — L03116 Cellulitis of left lower limb: Secondary | ICD-10-CM | POA: Diagnosis not present

## 2022-11-04 DIAGNOSIS — Z89411 Acquired absence of right great toe: Secondary | ICD-10-CM | POA: Diagnosis not present

## 2022-11-04 DIAGNOSIS — F909 Attention-deficit hyperactivity disorder, unspecified type: Secondary | ICD-10-CM | POA: Diagnosis not present

## 2022-11-04 DIAGNOSIS — E1142 Type 2 diabetes mellitus with diabetic polyneuropathy: Secondary | ICD-10-CM | POA: Diagnosis not present

## 2022-11-04 DIAGNOSIS — L97528 Non-pressure chronic ulcer of other part of left foot with other specified severity: Secondary | ICD-10-CM

## 2022-11-04 DIAGNOSIS — Z89412 Acquired absence of left great toe: Secondary | ICD-10-CM | POA: Diagnosis not present

## 2022-11-04 DIAGNOSIS — Z794 Long term (current) use of insulin: Secondary | ICD-10-CM | POA: Diagnosis not present

## 2022-11-04 DIAGNOSIS — I10 Essential (primary) hypertension: Secondary | ICD-10-CM | POA: Diagnosis not present

## 2022-11-04 DIAGNOSIS — F319 Bipolar disorder, unspecified: Secondary | ICD-10-CM | POA: Diagnosis not present

## 2022-11-04 DIAGNOSIS — E1140 Type 2 diabetes mellitus with diabetic neuropathy, unspecified: Secondary | ICD-10-CM | POA: Diagnosis not present

## 2022-11-04 DIAGNOSIS — E11621 Type 2 diabetes mellitus with foot ulcer: Secondary | ICD-10-CM | POA: Diagnosis not present

## 2022-11-04 MED ORDER — DOXYCYCLINE HYCLATE 100 MG PO TABS
100.0000 mg | ORAL_TABLET | Freq: Two times a day (BID) | ORAL | 0 refills | Status: DC
  Filled 2022-11-04: qty 28, 14d supply, fill #0

## 2022-11-04 MED ORDER — AMOXICILLIN-POT CLAVULANATE 875-125 MG PO TABS
1.0000 | ORAL_TABLET | Freq: Two times a day (BID) | ORAL | 0 refills | Status: DC
  Filled 2022-11-04: qty 28, 14d supply, fill #0

## 2022-11-07 NOTE — Progress Notes (Signed)
[name], [name] ([id number]) [id number] Page 1 of 9 Visit Report for 11/04/2022 Arrival Information Details Patient Name: Date of Service: [name] [name] [name] 11/04/2022 8:15 A M Medical Record Number: [id number] Patient Account Number: [id number] Date of Birth/Sex: Treating RN: Aug 22, 1980 (42 y.o. [name] Primary Care Preston Weill: [name] Other Clinician: Betha Loa Referring Ruthia Person: Treating Graysen Woodyard/Extender: [name] in Treatment: 5 Visit Information History Since Last Visit All ordered tests and consults were completed: No Patient Arrived: Ambulatory Added or deleted any medications: No Arrival Time: 08:22 Any new allergies or adverse reactions: No Transfer Assistance: None Had a fall or experienced change in No Patient Identification Verified: Yes activities of daily living that may affect Secondary Verification Process Completed: Yes risk of falls: Patient Requires Transmission-Based Precautions: No Signs or symptoms of abuse/neglect since No Patient Has Alerts: Yes last visito Patient Alerts: Left ABI 1.03 05/13/22 Hospitalized since last visit: No Right ABI .78 05/13/22 Implantable device outside of the clinic No excluding cellular tissue based products placed in the center since last visit: Has Dressing in Place as Prescribed: Yes Has Footwear/Offloading in Place as Yes Prescribed: Left: Surgical Shoe with Pressure Relief Insole Pain Present Now: No Electronic Signature(s) Signed: 11/04/2022 4:43:31 PM By: Betha Loa Entered By: Betha Loa on 11/04/2022 08:23:03 -------------------------------------------------------------------------------- Clinic Level of Care Assessment Details Patient Name: Date of Service: [name] [name] 11/04/2022 8:15 A M Medical Record Number: [id number] Patient Account Number: [id number] Date of Birth/Sex: Treating RN: 24-Sep-1980 (42 y.o. [name] Primary Care Jolynn Bajorek: [name] Other Clinician: Betha Loa Referring Jad Johansson: Treating Andriana Casa/Extender: [name] in Treatment: 5 Clinic Level of Care Assessment Items TOOL 4 Quantity Score CLEMMON, BROOKE ([id number]) [phone].pdf Page 2 of 9 []  - 0 Use when only an EandM is performed on FOLLOW-UP visit ASSESSMENTS - Nursing Assessment / Reassessment X- 1 10 Reassessment of Co-morbidities (includes updates in patient status) X- 1 5 Reassessment of Adherence to Treatment Plan ASSESSMENTS - Wound and Skin A ssessment / Reassessment X - Simple Wound Assessment / Reassessment - one wound 1 5 []  - 0 Complex Wound Assessment / Reassessment - multiple wounds []  - 0 Dermatologic / Skin Assessment (not related to wound area) ASSESSMENTS - Focused Assessment []  - 0 Circumferential Edema Measurements - multi extremities []  - 0 Nutritional Assessment / Counseling / Intervention []  - 0 Lower Extremity Assessment (monofilament, tuning fork, pulses) []  - 0 Peripheral Arterial Disease Assessment (using hand held doppler) ASSESSMENTS - Ostomy and/or Continence Assessment and Care []  - 0 Incontinence Assessment and Management []  - 0 Ostomy Care Assessment and Management (repouching, etc.) PROCESS - Coordination of Care X - Simple Patient / Family Education for ongoing care 1 15 []  - 0 Complex (extensive) Patient / Family Education for ongoing care []  - 0 Staff obtains Chiropractor, Records, T Results / Process Orders est []  - 0 Staff telephones HHA, Nursing Homes / Clarify orders / etc []  - 0 Routine Transfer to another Facility (non-emergent condition) []  - 0 Routine Hospital Admission (non-emergent condition) []  - 0 New Admissions / Manufacturing engineer / Ordering NPWT Apligraf, etc. , []  - 0 Emergency Hospital Admission (emergent condition) X- 1 10 Simple Discharge Coordination []  -  0 Complex (extensive) Discharge Coordination PROCESS - Special Needs []  - 0 Pediatric / Minor Patient Management []  - 0 Isolation Patient Management []  - 0 Hearing / Language / Visual special needs []  - 0 Assessment of Community assistance (transportation, D/C  planning, etc.) []  - 0 Additional assistance / Altered mentation []  - 0 Support Surface(s) Assessment (bed, cushion, seat, etc.) INTERVENTIONS - Wound Cleansing / Measurement X - Simple Wound Cleansing - one wound 1 5 []  - 0 Complex Wound Cleansing - multiple wounds X- 1 5 Wound Imaging (photographs - any number of wounds) []  - 0 Wound Tracing (instead of photographs) X- 1 5 Simple Wound Measurement - one wound []  - 0 Complex Wound Measurement - multiple wounds INTERVENTIONS - Wound Dressings []  - 0 Small Wound Dressing one or multiple wounds X- 1 15 Medium Wound Dressing one or multiple wounds []  - 0 Large Wound Dressing one or multiple wounds TALIESIN, STANO C ([id number]) [id number] Page 3 of 9 []  - 0 Application of Medications - topical []  - 0 Application of Medications - injection INTERVENTIONS - Miscellaneous []  - 0 External ear exam []  - 0 Specimen Collection (cultures, biopsies, blood, body fluids, etc.) []  - 0 Specimen(s) / Culture(s) sent or taken to Lab for analysis []  - 0 Patient Transfer (multiple staff / Nurse, adult / Similar devices) []  - 0 Simple Staple / Suture removal (25 or less) []  - 0 Complex Staple / Suture removal (26 or more) []  - 0 Hypo / Hyperglycemic Management (close monitor of Blood Glucose) []  - 0 Ankle / Brachial Index (ABI) - do not check if billed separately X- 1 5 Vital Signs Has the patient been seen at the hospital within the last three years: Yes Total Score: 80 Level Of Care: New/Established - Level 3 Electronic Signature(s) Signed: 11/04/2022 4:43:31 PM By: Betha Loa Entered By: Betha Loa on 11/04/2022  08:54:12 -------------------------------------------------------------------------------- Encounter Discharge Information Details Patient Name: Date of Service: [name] [name] 11/04/2022 8:15 A M Medical Record Number: [id number] Patient Account Number: [id number] Date of Birth/Sex: Treating RN: Feb 24, 1981 (42 y.o. [name] [name], [name] [name] Primary Care Shaquoya Cosper: [name] Other Clinician: Betha Loa Referring Kristyna Bradstreet: Treating Gazella Anglin/Extender: [name] in Treatment: 5 Encounter Discharge Information Items Discharge Condition: Stable Ambulatory Status: Ambulatory Discharge Destination: Home Transportation: Private Auto Accompanied By: self Schedule Follow-up Appointment: Yes Clinical Summary of Care: Electronic Signature(s) Signed: 11/04/2022 4:43:31 PM By: Betha Loa Entered By: Betha Loa on 11/04/2022 09:03:29 [name] [name] ([id number]) [id number] Page 4 of 9 -------------------------------------------------------------------------------- Lower Extremity Assessment Details Patient Name: Date of Service: [name] [name] 11/04/2022 8:15 A M Medical Record Number: [id number] Patient Account Number: [id number] Date of Birth/Sex: Treating RN: 26-Feb-1981 (42 y.o. [name] Primary Care Brytnee Bechler: [name] Other Clinician: Betha Loa Referring Hilario Robarts: Treating Lior Hoen/Extender: [name] in Treatment: 5 Edema Assessment Assessed: Kyra Searles: Yes] Franne Forts: No] Edema: [Left: Ye] [Right: s] Calf Left: Right: Point of Measurement: 38 cm From Medial Instep 43.5 cm Ankle Left: Right: Point of Measurement: 10 cm From Medial Instep 27 cm Vascular Assessment Pulses: Dorsalis Pedis Palpable: [Left:Yes] Electronic Signature(s) Signed: 11/04/2022 4:43:31 PM By: Betha Loa Signed: 11/06/2022 12:16:56 PM By: Elliot Gurney, BSN, RN, CWS, Kim RN, BSN Entered By:  Betha Loa on 11/04/2022 08:32:23 -------------------------------------------------------------------------------- Multi Wound Chart Details Patient Name: Date of Service: [name] [name] 11/04/2022 8:15 A M Medical Record Number: [id number] Patient Account Number: [id number] Date of Birth/Sex: Treating RN: 1981/03/15 (42 y.o. [name] Primary Care Armanii Urbanik: [name] Other Clinician: Betha Loa Referring Marvie Calender: Treating Tegh Franek/Extender: [name] in Treatment: 5 Vital Signs Height(in): 73 Pulse(bpm): 75 Weight(lbs): 330 Blood Pressure(mmHg): 124/78 Body Mass Index(BMI): 43.5 Temperature(F): 97.8 Respiratory Rate(breaths/min): 16 Heinlein,  EFSTATHIOS SCHLAGETER ([id number]) [id number] Page 5 of 9 [9:Photos:] [N/A:N/A] Left, Lateral Metatarsal head fifth N/A N/A Wound Location: Blister N/A N/A Wounding Event: Diabetic Wound/Ulcer of the Lower N/A N/A Primary Etiology: Extremity Hypertension, Peripheral Venous N/A N/A Comorbid History: Disease, Type II Diabetes, Osteomyelitis 10/07/2022 N/A N/A Date Acquired: 2 N/A N/A Weeks of Treatment: Open N/A N/A Wound Status: No N/A N/A Wound Recurrence: 1.5x2.4x0.6 N/A N/A Measurements L x W x D (cm) 2.827 N/A N/A A (cm) : rea 1.696 N/A N/A Volume (cm) : 34.60% N/A N/A % Reduction in A rea: -96.30% N/A N/A % Reduction in Volume: Unable to visualize wound bed N/A N/A Classification: Medium N/A N/A Exudate A mount: Serosanguineous N/A N/A Exudate Type: red, brown N/A N/A Exudate Color: Flat and Intact N/A N/A Wound Margin: Small (1-33%) N/A N/A Granulation A mount: Pink N/A N/A Granulation Quality: Large (67-100%) N/A N/A Necrotic A mount: Eschar, Adherent Slough N/A N/A Necrotic Tissue: Fat Layer (Subcutaneous Tissue): Yes N/A N/A Exposed Structures: Fascia: No Tendon: No Muscle: No Joint: No Bone: No None N/A  N/A Epithelialization: Treatment Notes Electronic Signature(s) Signed: 11/04/2022 4:43:31 PM By: Betha Loa Entered By: Betha Loa on 11/04/2022 08:32:27 -------------------------------------------------------------------------------- Multi-Disciplinary Care Plan Details Patient Name: Date of Service: [name] [name] 11/04/2022 8:15 A M Medical Record Number: [id number] Patient Account Number: [id number] Date of Birth/Sex: Treating RN: Sep 06, 1980 (42 y.o. [name] Primary Care Rose Hippler: [name] Other Clinician: Betha Loa Referring Boden Stucky: Treating Abel Hageman/Extender: [name] in Treatment: [address] RIYADH, MAZANEC ([id number]) [id number] Page 6 of 9 Necrotic Tissue Nursing Diagnoses: Knowledge deficit related to management of necrotic/devitalized tissue Goals: Necrotic/devitalized tissue will be minimized in the wound bed Date Initiated: 09/30/2022 Target Resolution Date: 10/31/2022 Goal Status: Active Interventions: Assess patient pain level pre-, during and post procedure and prior to discharge Notes: Wound/Skin Impairment Nursing Diagnoses: Knowledge deficit related to ulceration/compromised skin integrity Goals: Patient/caregiver will verbalize understanding of skin care regimen Date Initiated: 09/30/2022 Target Resolution Date: 10/31/2022 Goal Status: Active Ulcer/skin breakdown will have a volume reduction of 30% by week 4 Date Initiated: 09/30/2022 Target Resolution Date: 10/31/2022 Goal Status: Active Ulcer/skin breakdown will have a volume reduction of 50% by week 8 Date Initiated: 09/30/2022 Target Resolution Date: 11/30/2022 Goal Status: Active Ulcer/skin breakdown will have a volume reduction of 80% by week 12 Date Initiated: 09/30/2022 Target Resolution Date: 12/31/2022 Goal Status: Active Ulcer/skin breakdown will heal within 14 weeks Date Initiated: 09/30/2022 Target  Resolution Date: 01/31/2023 Goal Status: Active Interventions: Assess patient/caregiver ability to obtain necessary supplies Assess patient/caregiver ability to perform ulcer/skin care regimen upon admission and as needed Assess ulceration(s) every visit Notes: Electronic Signature(s) Signed: 11/04/2022 4:43:31 PM By: Betha Loa Signed: 11/06/2022 12:16:56 PM By: Elliot Gurney, BSN, RN, CWS, Kim RN, BSN Entered By: Betha Loa on 11/04/2022 08:54:22 -------------------------------------------------------------------------------- Pain Assessment Details Patient Name: Date of Service: [name] [name] 11/04/2022 8:15 A M Medical Record Number: [id number] Patient Account Number: [id number] Date of Birth/Sex: Treating RN: [birth year] (42 y.o. [name] Primary Care Dnasia Gauna: [name] Other Clinician: Betha Loa Referring Raelie Lohr: Treating Khilynn Borntreger/Extender: [name] in Treatment: 5 Active Problems Location of Pain Severity and Description of Pain Patient Has Paino No KENTEN, FREIRE ([id number]) [phone].pdf Page 7 of 9 Patient Has Paino No Site Locations Pain Management and Medication Current Pain Management: Electronic Signature(s) Signed: 11/04/2022 4:43:31 PM By: Betha Loa Signed: 11/06/2022 12:16:56 PM By: Elliot Gurney, BSN, RN, CWS, Kim RN,  BSN Entered By: Betha Loa on 11/04/2022 08:24:58 -------------------------------------------------------------------------------- Patient/Caregiver Education Details Patient Name: Date of Service: [name] [name] 6/12/2024andnbsp8:15 A M Medical Record Number: [id number] Patient Account Number: [id number] Date of Birth/Gender: Treating RN: 15-Jun-1980 (42 y.o. [name] Primary Care Physician: [name] Other Clinician: Betha Loa Referring Physician: Treating Physician/Extender: [name] in Treatment:  5 Education Assessment Education Provided To: Patient Education Topics Provided Wound/Skin Impairment: Handouts: Other: continue wound care as directed Methods: Explain/Verbal Responses: State content correctly Electronic Signature(s) Signed: 11/04/2022 4:43:31 PM By: Betha Loa Entered By: Betha Loa on 11/04/2022 09:02:49 [name] [name] ([id number]) [id number] Page 8 of 9 -------------------------------------------------------------------------------- Wound Assessment Details Patient Name: Date of Service: [name] [name] [name] 11/04/2022 8:15 A M Medical Record Number: [id number] Patient Account Number: [id number] Date of Birth/Sex: Treating RN: Jun 10, 1980 (42 y.o. [name] [name], [name] [name] Primary Care Tyron Manetta: [name] Other Clinician: Betha Loa Referring Sofia Vanmeter: Treating Deloy Archey/Extender: [name] in Treatment: 5 Wound Status Wound Number: 9 Primary Diabetic Wound/Ulcer of the Lower Extremity Etiology: Wound Location: Left, Lateral Metatarsal head fifth Wound Status: Open Wounding Event: Blister Comorbid Hypertension, Peripheral Venous Disease, Type II Diabetes, Date Acquired: 10/07/2022 History: Osteomyelitis Weeks Of Treatment: 2 Clustered Wound: No Photos Wound Measurements Length: (cm) 1.5 Width: (cm) 2.4 Depth: (cm) 0.6 Area: (cm) 2.827 Volume: (cm) 1.696 % Reduction in Area: 34.6% % Reduction in Volume: -96.3% Epithelialization: None Wound Description Classification: Unable to visualize wound bed Wound Margin: Flat and Intact Exudate Amount: Medium Exudate Type: Serosanguineous Exudate Color: red, brown Foul Odor After Cleansing: No Slough/Fibrino Yes Wound Bed Granulation Amount: Small (1-33%) Exposed Structure Granulation Quality: Pink Fascia Exposed: No Necrotic Amount: Large (67-100%) Fat Layer (Subcutaneous Tissue) Exposed: Yes Necrotic Quality: Eschar, Adherent  Slough Tendon Exposed: No Muscle Exposed: No Joint Exposed: No Bone Exposed: No Treatment Notes Wound #9 (Metatarsal head fifth) Wound Laterality: Left, Lateral Cleanser Soap and Water Discharge Instruction: Gently cleanse wound with antibacterial soap, rinse and pat dry prior to dressing wounds CONCEPCION, REESOR C ([id number]) [id number] Page 9 of 9 Peri-Wound Care Topical Primary Dressing Hydrofera Blue Ready Transfer Foam, 4x5 (in/in) Discharge Instruction: Apply Hydrofera Blue Ready to wound bed as directed Secondary Dressing Kerlix 4.5 x 4.1 (in/yd) Discharge Instruction: Apply Kerlix 4.5 x 4.1 (in/yd) as instructed Secured With ACE WRAP - 82M ACE Elastic Bandage With VELCRO Brand Closure, 4 (in) Compression Wrap Compression Stockings Add-Ons Electronic Signature(s) Signed: 11/04/2022 4:43:31 PM By: Betha Loa Signed: 11/06/2022 12:16:56 PM By: Elliot Gurney, BSN, RN, CWS, Kim RN, BSN Entered By: Betha Loa on 11/04/2022 08:30:58 -------------------------------------------------------------------------------- Vitals Details Patient Name: Date of Service: [name] [name] 11/04/2022 8:15 A M Medical Record Number: [id number] Patient Account Number: [id number] Date of Birth/Sex: Treating RN: 21-Mar-1981 (42 y.o. [name] [name], [name] [name] Primary Care Ifrah Vest: [name] Other Clinician: Betha Loa Referring Tyger Wichman: Treating Kaveri Perras/Extender: [name] in Treatment: 5 Vital Signs Time Taken: 08:32 Temperature (F): 97.8 Height (in): 73 Pulse (bpm): 75 Weight (lbs): 330 Respiratory Rate (breaths/min): 16 Body Mass Index (BMI): 43.5 Blood Pressure (mmHg): 124/78 Reference Range: 80 - 120 mg / dl Electronic Signature(s) Signed: 11/04/2022 4:43:31 PM By: Betha Loa Entered By: Betha Loa on 11/04/2022 08:24:54

## 2022-11-07 NOTE — Progress Notes (Signed)
[name], [name] ([id number]) [id number] Page 1 of 8 Visit Report for 11/04/2022 Chief Complaint Document Details Patient Name: Date of Service: [name], [name] 11/04/2022 8:15 A M Medical Record Number: [id number] Patient Account Number: [id number] Date of Birth/Sex: Treating RN: 1980/09/04 (42 y.o. [name] Primary Care Provider: [name] Other Clinician: Betha Loa Referring Provider: Treating Provider/Extender: [name] in Treatment: 5 Information Obtained from: Patient Chief Complaint 05/13/2022; bilateral feet wounds 09/30/2022; patient returns to clinic with wounds on his left plantar foot Electronic Signature(s) Signed: 11/04/2022 12:36:50 PM By: Geralyn Corwin DO Entered By: Geralyn Corwin on 11/04/2022 09:09:15 -------------------------------------------------------------------------------- HPI Details Patient Name: Date of Service: [name] 11/04/2022 8:15 A M Medical Record Number: [id number] Patient Account Number: [id number] Date of Birth/Sex: Treating RN: 08-Jan-1981 (42 y.o. [name] Primary Care Provider: [name] Other Clinician: Betha Loa Referring Provider: Treating Provider/Extender: [name] in Treatment: 5 History of Present Illness HPI Description: 05/13/2022 [name] is a 42 year old male with uncontrolled insulin-dependent type 2 diabetes with last hemoglobin A1c of 11.1, previous feet wounds that led to bilateral great toe amputations, ADHD and bipolar depression that presents the clinic for a 1 month history of non healing ulcers to the Feet bilaterally. His previous bilateral great toe amputation sites have healed. He has been using an antibacterial spray to keep the areas clean. He has not been dressing the wounds. He states that over the past couple days he has had increased redness and warmth to  the right foot. He has regular tennis shoes. He does not use any offloading device. He states he walks around in socks and does not go barefoot. 05/20/2022. This is a 42 year old man with type 2 diabetes and neuropathy. He has wounds on his right first metatarsal head and the base of his right fourth and fifth toes. He has been using Medihoney. Apparently there was a plan to have him casted today. He has had previously amputated first toes bilaterally a year to a year and a half ago by Dr. Lajoyce Corners for underlying osteomyelitis according to the patient. The patient has not had any foot wear. Because his forefoot is so wide he simply ordered his shoes online. Last week because of redness and warmth on the left foot he was put on Augmentin and doxycycline READMISSION 09/30/2022 [name], [name] [id number] Page 2 of 8 This is a 42 year old man who is a type II diabetic with diabetic peripheral neuropathy. He was here for the clinic in the clinic in late September 2023 for 2 visits only and on the left foot had very similar wounds to currently. He tells [name] he left the clinic related to a problem with his underlying bipolar disorder. In any case he still has an area on the left first metatarsal head and at the base of the fourth and fifth toes. This is very similar by my memory to what was present in December 2023. He tells [name] that last week or 2 this became somewhat red and warm. He used doxycycline and amoxicillin for 5 days that he had leftover from a previous visit and that helped. At this point I am not sure about his diabetes control. He had peg assist shoes last time he was here he has not been wearing these Past medical history includes type 2 diabetes with peripheral neuropathy, and bilateral great toe amputations, ADHD, bipolar disorder, ABIs in our clinic were 0.78 on the right he does  not have wounds here and 1.03 on the left which is his wounded  side 5/15; patient presents for follow-up. He has been using Hydrofera Blue to the wound beds. The lateral left foot wound has healed. He has 2 remaining wounds. He has no issues or complaints today. 5/29; patient presents for follow-up. He has been using Hydrofera Blue to the wound beds. Unfortunately the lateral left foot wound has reopened. He denies signs of infection. 6/5 the patient had wounds on his first and fourth metatarsal heads which have healed. Unfortunately he comes in with erythema around the area on the left fifth metatarsal head. 6/12; patient presents for follow-up. He was prescribed doxycycline at last clinic visit due to increased erythema to the left fifth met head. According to the patient he did not pick this up. A culture was also done that grew Streptococcus sensitive to amoxicillin. This was sent in however the patient did not pick this up either. Wound has declined in appearance and there is increased erythema to the periwound however this is not streaking or progressing compared to last clinic pictures. He knows to go to the ED if he were to have worsening symptoms including increased pain, erythema, warmth or purulent drainage Electronic Signature(s) Signed: 11/04/2022 12:36:50 PM By: Geralyn Corwin DO Entered By: Geralyn Corwin on 11/04/2022 09:12:36 -------------------------------------------------------------------------------- Physical Exam Details Patient Name: Date of Service: [name] 11/04/2022 8:15 A M Medical Record Number: [id number] Patient Account Number: [id number] Date of Birth/Sex: Treating RN: 13-Nov-1980 (42 y.o. [name] Primary Care Provider: [name] Other Clinician: Betha Loa Referring Provider: Treating Provider/Extender: [name] in Treatment: 5 Constitutional . Cardiovascular . Psychiatric . Notes T the left lateral aspect of the left fifth met head there is an open  wound with increased depth that probes close to bone. Increased erythema and warmth to o the periwound. No purulent drainage. Electronic Signature(s) Signed: 11/04/2022 12:36:50 PM By: Geralyn Corwin DO Entered By: Geralyn Corwin on 11/04/2022 09:13:23 [name] ([id number]) [id number] Page 3 of 8 -------------------------------------------------------------------------------- Physician Orders Details Patient Name: Date of Service: [name], [name] 11/04/2022 8:15 A M Medical Record Number: [id number] Patient Account Number: [id number] Date of Birth/Sex: Treating RN: 1980/07/29 (42 y.o. [name] Primary Care Provider: [name] Other Clinician: Betha Loa Referring Provider: Treating Provider/Extender: [name] in Treatment: 5 Verbal / Phone Orders: Yes Clinician: Huel Coventry Read Back and Verified: Yes Diagnosis Coding Follow-up Appointments Return Appointment in 1 week. Bathing/ [employer] wounds with antibacterial soap and water. Edema Control - Lymphedema / Segmental Compressive Device / Other Elevate, Exercise Daily and A void Standing for Long Periods of Time. Elevate legs to the level of the heart and pump ankles as often as possible Elevate leg(s) parallel to the floor when sitting. Off-Loading Open toe surgical shoe - left Turn and reposition every 2 hours Medications-Please add to medication list. ntibiotics - start Augmentin, Doxycycline P.O. A Wound Treatment Wound #9 - Metatarsal head fifth Wound Laterality: Left, Lateral Cleanser: Soap and Water 3 x Per Week/30 Days Discharge Instructions: Gently cleanse wound with antibacterial soap, rinse and pat dry prior to dressing wounds Prim Dressing: Hydrofera Blue Ready Transfer Foam, 4x5 (in/in) 3 x Per Week/30 Days ary Discharge Instructions: Apply Hydrofera Blue Ready to wound bed as directed Secondary Dressing:  Kerlix 4.5 x 4.1 (in/yd) 3 x Per Week/30 Days Discharge Instructions: Apply Kerlix 4.5 x 4.1 (in/yd) as instructed Secured With: ACE  WRAP - 30M ACE Elastic Bandage With VELCRO Brand Closure, 4 (in) 3 x Per Week/30 Days Patient Medications llergies: milk A Notifications Medication Indication Start End 11/04/2022 amoxicillin-pot clavulanate DOSE 1 - oral 875 mg-125 mg tablet - 1 tablet oral twice a day x 14 days 11/04/2022 doxycycline hyclate DOSE 1 - oral 100 mg tablet - 1 tablet oral twice a day x 14 days Electronic Signature(s) Signed: 11/04/2022 12:36:50 PM By: Geralyn Corwin DO Previous Signature: 11/04/2022 9:17:58 AM Version By: Geralyn Corwin DO Entered By: Geralyn Corwin on 11/04/2022 09:27:09 [name] ([id number]) [id number] Page 4 of 8 -------------------------------------------------------------------------------- Problem List Details Patient Name: Date of Service: [name], [name] 11/04/2022 8:15 A M Medical Record Number: [id number] Patient Account Number: [id number] Date of Birth/Sex: Treating RN: 03/20/1981 (42 y.o. Loel Lofty, Selena Batten Primary Care Provider: [name] Other Clinician: Betha Loa Referring Provider: Treating Provider/Extender: [name] in Treatment: 5 Active Problems ICD-10 Encounter Code Description Active Date MDM Diagnosis E11.621 Type 2 diabetes mellitus with foot ulcer 09/30/2022 No Yes L97.528 Non-pressure chronic ulcer of other part of left foot with other specified 09/30/2022 No Yes severity E11.40 Type 2 diabetes mellitus with diabetic neuropathy, unspecified 09/30/2022 No Yes Inactive Problems Resolved Problems Electronic Signature(s) Signed: 11/04/2022 12:36:50 PM By: Geralyn Corwin DO Entered By: Geralyn Corwin on 11/04/2022 09:09:08 -------------------------------------------------------------------------------- Progress Note Details Patient Name: Date  of Service: [name] 11/04/2022 8:15 A M Medical Record Number: [id number] Patient Account Number: [id number] Date of Birth/Sex: Treating RN: 05-03-1981 (42 y.o. [name] Primary Care Provider: [name] Other Clinician: Betha Loa Referring Provider: Treating Provider/Extender: [name] in Treatment: 5 Subjective Chief Complaint [name], [name] [id number] Page 5 of 8 Information obtained from Patient 05/13/2022; bilateral feet wounds 09/30/2022; patient returns to clinic with wounds on his left plantar foot History of Present Illness (HPI) 05/13/2022 [name] is a 42 year old male with uncontrolled insulin-dependent type 2 diabetes with last hemoglobin A1c of 11.1, previous feet wounds that led to bilateral great toe amputations, ADHD and bipolar depression that presents the clinic for a 1 month history of non healing ulcers to the Feet bilaterally. His previous bilateral great toe amputation sites have healed. He has been using an antibacterial spray to keep the areas clean. He has not been dressing the wounds. He states that over the past couple days he has had increased redness and warmth to the right foot. He has regular tennis shoes. He does not use any offloading device. He states he walks around in socks and does not go barefoot. 05/20/2022. This is a 42 year old man with type 2 diabetes and neuropathy. He has wounds on his right first metatarsal head and the base of his right fourth and fifth toes. He has been using Medihoney. Apparently there was a plan to have him casted today. He has had previously amputated first toes bilaterally a year to a year and a half ago by Dr. Lajoyce Corners for underlying osteomyelitis according to the patient. The patient has not had any foot wear. Because his forefoot is so wide he simply ordered his shoes online. Last week because of redness and  warmth on the left foot he was put on Augmentin and doxycycline READMISSION 09/30/2022 This is a 42 year old man who is a type II diabetic with diabetic peripheral neuropathy. He was here for the clinic in the clinic in late September 2023 for 2 visits only and on the left foot had very  similar wounds to currently. He tells [name] he left the clinic related to a problem with his underlying bipolar disorder. In any case he still has an area on the left first metatarsal head and at the base of the fourth and fifth toes. This is very similar by my memory to what was present in December 2023. He tells [name] that last week or 2 this became somewhat red and warm. He used doxycycline and amoxicillin for 5 days that he had leftover from a previous visit and that helped. At this point I am not sure about his diabetes control. He had peg assist shoes last time he was here he has not been wearing these Past medical history includes type 2 diabetes with peripheral neuropathy, and bilateral great toe amputations, ADHD, bipolar disorder, ABIs in our clinic were 0.78 on the right he does not have wounds here and 1.03 on the left which is his wounded side 5/15; patient presents for follow-up. He has been using Hydrofera Blue to the wound beds. The lateral left foot wound has healed. He has 2 remaining wounds. He has no issues or complaints today. 5/29; patient presents for follow-up. He has been using Hydrofera Blue to the wound beds. Unfortunately the lateral left foot wound has reopened. He denies signs of infection. 6/5 the patient had wounds on his first and fourth metatarsal heads which have healed. Unfortunately he comes in with erythema around the area on the left fifth metatarsal head. 6/12; patient presents for follow-up. He was prescribed doxycycline at last clinic visit due to increased erythema to the left fifth met head. According to the patient he did not pick this up. A culture was also done that grew  Streptococcus sensitive to amoxicillin. This was sent in however the patient did not pick this up either. Wound has declined in appearance and there is increased erythema to the periwound however this is not streaking or progressing compared to last clinic pictures. He knows to go to the ED if he were to have worsening symptoms including increased pain, erythema, warmth or purulent drainage Patient History Social History Never smoker, Marital Status - Single, Alcohol Use - Never, Drug Use - No History, Caffeine Use - Rarely. Medical History Cardiovascular Patient has history of Hypertension, Peripheral Venous Disease Endocrine Patient has history of Type II Diabetes Musculoskeletal Patient has history of Osteomyelitis Objective Constitutional Vitals Time Taken: 8:32 AM, Height: 73 in, Weight: 330 lbs, BMI: 43.5, Temperature: 97.8 F, Pulse: 75 bpm, Respiratory Rate: 16 breaths/min, Blood Pressure: 124/78 mmHg. General Notes: T the left lateral aspect of the left fifth met head there is an open wound with increased depth that probes close to bone. Increased erythema o and warmth to the periwound. No purulent drainage. Integumentary (Hair, Skin) Wound #9 status is Open. Original cause of wound was Blister. The date acquired was: 10/07/2022. The wound has been in treatment 2 weeks. The wound is located on the Left,Lateral Metatarsal head fifth. The wound measures 1.5cm length x 2.4cm width x 0.6cm depth; 2.827cm^2 area and 1.696cm^3 volume. There is Fat Layer (Subcutaneous Tissue) exposed. There is a medium amount of serosanguineous drainage noted. The wound margin is flat and intact. There is small (1-33%) pink granulation within the wound bed. There is a large (67-100%) amount of necrotic tissue within the wound bed including Eschar and Adherent Slough. [name], [name] ([id number]) [id number] Page 6 of 8 Assessment Active Problems ICD-10 Type 2 diabetes  mellitus with foot ulcer Non-pressure chronic ulcer  of other part of left foot with other specified severity Type 2 diabetes mellitus with diabetic neuropathy, unspecified Patient presents with worsening wound to the lateral fifth met head. Unfortunately he did not start antibiotics last week. I will go ahead and send in doxycycline and Augmentin for 2 weeks. He knows he is at high risk for amputation based on exam findings today. He knows to go to the ED if he has worsening symptoms. He has a surgical shoe for offloading however he has been very active lately. Once the infection is under control he will need to be casted. If he does not improve in his symptoms with antibiotics we will need to get further imaging. He is also an uncontrolled diabetic and we had a discussion today again about the importance of glycemic control for his wound healing. We have had ongoing discussions about this throughout his admission. Last hemoglobin A1c was 11. Follow-up in 1 week. Plan Follow-up Appointments: Return Appointment in 1 week. Bathing/ Shower/ Hygiene: Wash wounds with antibacterial soap and water. Edema Control - Lymphedema / Segmental Compressive Device / Other: Elevate, Exercise Daily and Avoid Standing for Long Periods of Time. Elevate legs to the level of the heart and pump ankles as often as possible Elevate leg(s) parallel to the floor when sitting. Off-Loading: Open toe surgical shoe - left Turn and reposition every 2 hours Medications-Please add to medication list.: P.O. Antibiotics - start Augmentin, Doxycycline The following medication(s) was prescribed: amoxicillin-pot clavulanate oral 875 mg-125 mg tablet 1 1 tablet oral twice a day x 14 days starting 11/04/2022 doxycycline hyclate oral 100 mg tablet 1 1 tablet oral twice a day x 14 days starting 11/04/2022 WOUND #9: - Metatarsal head fifth Wound Laterality: Left, Lateral Cleanser: Soap and Water 3 x Per Week/30 Days Discharge  Instructions: Gently cleanse wound with antibacterial soap, rinse and pat dry prior to dressing wounds Prim Dressing: Hydrofera Blue Ready Transfer Foam, 4x5 (in/in) 3 x Per Week/30 Days ary Discharge Instructions: Apply Hydrofera Blue Ready to wound bed as directed Secondary Dressing: Kerlix 4.5 x 4.1 (in/yd) 3 x Per Week/30 Days Discharge Instructions: Apply Kerlix 4.5 x 4.1 (in/yd) as instructed Secured With: ACE WRAP - 20M ACE Elastic Bandage With VELCRO Brand Closure, 4 (in) 3 x Per Week/30 Days 1. Augmentin and doxycycline for 2 weeks 2. Follow-up in 1 week 3. Hydrofera Blue 4. Aggressive offloading Electronic Signature(s) Signed: 11/04/2022 12:36:50 PM By: Geralyn Corwin DO Entered By: Geralyn Corwin on 11/04/2022 09:26:54 -------------------------------------------------------------------------------- ROS/PFSH Details Patient Name: Date of Service: [name] 11/04/2022 8:15 A M Medical Record Number: [id number] Patient Account Number: [id number] Date of Birth/Sex: Treating RN: 11-Mar-1981 (42 y.o. [name] Primary Care Provider: [name] Other Clinician: Daqwan, [name] ([id number]) [id number] Page 7 of 8 Referring Provider: Treating Provider/Extender: [name] in Treatment: 5 Cardiovascular Medical History: Positive for: Hypertension; Peripheral Venous Disease Endocrine Medical History: Positive for: Type II Diabetes Time with diabetes: 2000 Treated with: Insulin, Oral agents Blood sugar tested every day: No Musculoskeletal Medical History: Positive for: Osteomyelitis Immunizations Pneumococcal Vaccine: Received Pneumococcal Vaccination: No Implantable Devices No devices added Family and Social History Never smoker; Marital Status - Single; Alcohol Use: Never; Drug Use: No History; Caffeine Use: Rarely Electronic Signature(s) Signed: 11/04/2022 12:36:50 PM By:  Geralyn Corwin DO Signed: 11/06/2022 12:16:56 PM By: Elliot Gurney, BSN, RN, CWS, Kim RN, BSN Entered By: Geralyn Corwin on 11/04/2022 09:27:19 -------------------------------------------------------------------------------- SuperBill Details Patient Name: Date of Service:  [name], [name] 11/04/2022 Medical Record Number: [id number] Patient Account Number: [id number] Date of Birth/Sex: Treating RN: 22-Jun-1980 (42 y.o. [name] Primary Care Provider: [name] Other Clinician: Betha Loa Referring Provider: Treating Provider/Extender: [name] in Treatment: 5 Diagnosis Coding ICD-10 Codes Code Description [phone] Type 2 diabetes mellitus with foot ulcer 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rity E11.40 Type 2 diabetes mellitus with diabetic neuropathy, unspecified L03.116 Cellulitis of left lower limb Facility Procedures : [name] Code: [id number] [name], [name] ([id number]) Description: 99213 - WOUND CARE VISIT-LEV 3 EST PT [id number] Modifier: [id number] Quantity: 1 [id number] Page 8 of 8 Physician Procedures : CPT4 Code Description Modifier [id number] [phone] - WC PHYS LEVEL 4 - EST PT ICD-10 Diagnosis Description E11.621 Type 2 diabetes mellitus with foot ulcer 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rity E11.40  Type 2 diabetes mellitus with diabetic neuropathy, unspecified L03.116 Cellulitis of left lower limb Quantity: 1 Electronic Signature(s) Signed: 11/04/2022 12:36:50 PM By: Geralyn Corwin DO Entered By: Geralyn Corwin on 11/04/2022 09:27:00

## 2022-11-11 ENCOUNTER — Encounter (HOSPITAL_BASED_OUTPATIENT_CLINIC_OR_DEPARTMENT_OTHER): Payer: 59 | Admitting: Internal Medicine

## 2022-11-11 DIAGNOSIS — I10 Essential (primary) hypertension: Secondary | ICD-10-CM | POA: Diagnosis not present

## 2022-11-11 DIAGNOSIS — F319 Bipolar disorder, unspecified: Secondary | ICD-10-CM | POA: Diagnosis not present

## 2022-11-11 DIAGNOSIS — L97528 Non-pressure chronic ulcer of other part of left foot with other specified severity: Secondary | ICD-10-CM | POA: Diagnosis not present

## 2022-11-11 DIAGNOSIS — F909 Attention-deficit hyperactivity disorder, unspecified type: Secondary | ICD-10-CM | POA: Diagnosis not present

## 2022-11-11 DIAGNOSIS — E11621 Type 2 diabetes mellitus with foot ulcer: Secondary | ICD-10-CM | POA: Diagnosis not present

## 2022-11-11 DIAGNOSIS — Z89412 Acquired absence of left great toe: Secondary | ICD-10-CM | POA: Diagnosis not present

## 2022-11-11 DIAGNOSIS — Z794 Long term (current) use of insulin: Secondary | ICD-10-CM | POA: Diagnosis not present

## 2022-11-11 DIAGNOSIS — Z89411 Acquired absence of right great toe: Secondary | ICD-10-CM | POA: Diagnosis not present

## 2022-11-11 DIAGNOSIS — E1142 Type 2 diabetes mellitus with diabetic polyneuropathy: Secondary | ICD-10-CM | POA: Diagnosis not present

## 2022-11-16 NOTE — Progress Notes (Signed)
SHILOH, SWOPES ([id number]) [id number] Page 1 of 9 Visit Report for 11/11/2022 Arrival Information Details Patient Name: Date of Service: [name], [name] 11/11/2022 8:15 A M Medical Record Number: [id number] Patient Account Number: [id number] Date of Birth/Sex: Treating RN: 1981/03/17 (42 y.o. [name] Primary Care Yelena Metzer: [name] Other Clinician: Betha Loa Referring Sherrill Mckamie: Treating Michaiah Holsopple/Extender: [name] in Treatment: 6 Visit Information History Since Last Visit All ordered tests and consults were completed: No Patient Arrived: Ambulatory Added or deleted any medications: No Arrival Time: 08:22 Any new allergies or adverse reactions: No Transfer Assistance: None Had a fall or experienced change in No Patient Identification Verified: Yes activities of daily living that may affect Secondary Verification Process Completed: Yes risk of falls: Patient Requires Transmission-Based Precautions: No Signs or symptoms of abuse/neglect since No Patient Has Alerts: Yes last visito Patient Alerts: Left ABI 1.03 05/13/22 Hospitalized since last visit: No Right ABI .78 05/13/22 Implantable device outside of the clinic No excluding cellular tissue based products placed in the center since last visit: Has Dressing in Place as Prescribed: Yes Has Footwear/Offloading in Place as Yes Prescribed: Left: Surgical Shoe with Pressure Relief Insole Pain Present Now: No Electronic Signature(s) Signed: 11/12/2022 5:12:55 PM By: Betha Loa Entered By: Betha Loa on 11/11/2022 08:22:53 -------------------------------------------------------------------------------- Clinic Level of Care Assessment Details Patient Name: Date of Service: [name], [name] 11/11/2022 8:15 A M Medical Record Number: [id number] Patient Account Number: [id number] Date of Birth/Sex: Treating RN: 1981-03-20 (42 y.o. [name] Primary Care Garrie Elenes: [name] Other Clinician: Betha Loa Referring Vielka Klinedinst: Treating Cornelia Walraven/Extender: [name] in Treatment: 6 Clinic Level of Care Assessment Items TOOL 1 Quantity Score MAYLON, SAILORS ([id number]) [phone].pdf Page 2 of 9 []  - 0 Use when EandM and Procedure is performed on INITIAL visit ASSESSMENTS - Nursing Assessment / Reassessment []  - 0 General Physical Exam (combine w/ comprehensive assessment (listed just below) when performed on new pt. evals) []  - 0 Comprehensive Assessment (HX, ROS, Risk Assessments, Wounds Hx, etc.) ASSESSMENTS - Wound and Skin Assessment / Reassessment []  - 0 Dermatologic / Skin Assessment (not related to wound area) ASSESSMENTS - Ostomy and/or Continence Assessment and Care []  - 0 Incontinence Assessment and Management []  - 0 Ostomy Care Assessment and Management (repouching, etc.) PROCESS - Coordination of Care []  - 0 Simple Patient / Family Education for ongoing care []  - 0 Complex (extensive) Patient / Family Education for ongoing care []  - 0 Staff obtains Chiropractor, Records, T Results / Process Orders est []  - 0 Staff telephones HHA, Nursing Homes / Clarify orders / etc []  - 0 Routine Transfer to another Facility (non-emergent condition) []  - 0 Routine Hospital Admission (non-emergent condition) []  - 0 New Admissions / Manufacturing engineer / Ordering NPWT Apligraf, etc. , []  - 0 Emergency Hospital Admission (emergent condition) PROCESS - Special Needs []  - 0 Pediatric / Minor Patient Management []  - 0 Isolation Patient Management []  - 0 Hearing / Language / Visual special needs []  - 0 Assessment of Community assistance (transportation, D/C planning, etc.) []  - 0 Additional assistance / Altered mentation []  - 0 Support Surface(s) Assessment (bed, cushion, seat, etc.) INTERVENTIONS - Miscellaneous []  - 0 External ear  exam []  - 0 Patient Transfer (multiple staff / Nurse, adult / Similar devices) []  - 0 Simple Staple / Suture removal (25 or less) []  - 0 Complex Staple / Suture removal (26 or more) []  - 0 Hypo/Hyperglycemic Management (do  not check if billed separately) []  - 0 Ankle / Brachial Index (ABI) - do not check if billed separately Has the patient been seen at the hospital within the last three years: Yes Total Score: 0 Level Of Care: ____ Electronic Signature(s) Signed: 11/12/2022 5:12:55 PM By: Betha Loa Entered By: Betha Loa on 11/11/2022 08:55:11 Encounter Discharge Information Details -------------------------------------------------------------------------------- [name] ([id number]) [id number] Page 3 of 9 Patient Name: Date of Service: [name], [name] 11/11/2022 8:15 A M Medical Record Number: [id number] Patient Account Number: [id number] Date of Birth/Sex: Treating RN: 03-15-1981 (42 y.o. [name] Primary Care Lachrisha Ziebarth: [name] Other Clinician: Betha Loa Referring Bienvenido Proehl: Treating Primo Innis/Extender: [name] in Treatment: 6 Encounter Discharge Information Items Post Procedure Vitals Discharge Condition: Stable Temperature (F): 97.8 Ambulatory Status: Ambulatory Pulse (bpm): 77 Discharge Destination: Home Respiratory Rate (breaths/min): 18 Transportation: Private Auto Blood Pressure (mmHg): 113/79 Accompanied By: self Schedule Follow-up Appointment: Yes Clinical Summary of Care: Electronic Signature(s) Signed: 11/12/2022 5:12:55 PM By: Betha Loa Entered By: Betha Loa on 11/11/2022 09:06:40 -------------------------------------------------------------------------------- Lower Extremity Assessment Details Patient Name: Date of Service: [name], [name] 11/11/2022 8:15 A M Medical Record Number: [id number] Patient Account Number: [id number] Date of  Birth/Sex: Treating RN: [birth year] (42 y.o. [name] Primary Care Beatrice Sehgal: [name] Other Clinician: Betha Loa Referring Eliska Hamil: Treating Shainna Faux/Extender: [name] in Treatment: 6 Edema Assessment Assessed: Kyra Searles: Yes] [Right: No] Edema: [Left: Ye] [Right: s] Calf Left: Right: Point of Measurement: 38 cm From Medial Instep 44.3 cm Ankle Left: Right: Point of Measurement: 10 cm From Medial Instep 25.8 cm Vascular Assessment Pulses: Dorsalis Pedis Palpable: [Left:Yes] Electronic Signature(s) Signed: 11/12/2022 5:12:55 PM By: Betha Loa Signed: 11/16/2022 9:09:50 AM By: Elliot Gurney, BSN, RN, CWS, Kim RN, BSN Entered By: Betha Loa on 11/11/2022 08:31:01 [name] ([id number]) [id number] Page 4 of 9 -------------------------------------------------------------------------------- Multi Wound Chart Details Patient Name: Date of Service: [name], [name] 11/11/2022 8:15 A M Medical Record Number: [id number] Patient Account Number: [id number] Date of Birth/Sex: Treating RN: 03-Sep-1980 (42 y.o. [name] Primary Care Kataleena Holsapple: [name] Other Clinician: Betha Loa Referring Rayyan Burley: Treating Alcario Tinkey/Extender: [name] in Treatment: 6 Vital Signs Height(in): 73 Pulse(bpm): 77 Weight(lbs): 330 Blood Pressure(mmHg): 113/79 Body Mass Index(BMI): 43.5 Temperature(F): 97.8 Respiratory Rate(breaths/min): 18 [9:Photos:] [N/A:N/A] Left, Lateral Metatarsal head fifth N/A N/A Wound Location: Blister N/A N/A Wounding Event: Diabetic Wound/Ulcer of the Lower N/A N/A Primary Etiology: Extremity Hypertension, Peripheral Venous N/A N/A Comorbid History: Disease, Type II Diabetes, Osteomyelitis 10/07/2022 N/A N/A Date Acquired: 3 N/A N/A Weeks of Treatment: Open N/A N/A Wound Status: No N/A N/A Wound Recurrence: 1.4x1.8x0.4 N/A  N/A Measurements L x W x D (cm) 1.979 N/A N/A A (cm) : rea 0.792 N/A N/A Volume (cm) : 54.20% N/A N/A % Reduction in A rea: 8.30% N/A N/A % Reduction in Volume: Unable to visualize wound bed N/A N/A Classification: Medium N/A N/A Exudate A mount: Serosanguineous N/A N/A Exudate Type: red, brown N/A N/A Exudate Color: Flat and Intact N/A N/A Wound Margin: Small (1-33%) N/A N/A Granulation A mount: Pink N/A N/A Granulation Quality: Large (67-100%) N/A N/A Necrotic A mount: Eschar, Adherent Slough N/A N/A Necrotic Tissue: Fat Layer (Subcutaneous Tissue): Yes N/A N/A Exposed Structures: Fascia: No Tendon: No Muscle: No Joint: No Bone: No None N/A N/A Epithelialization: Treatment Notes Electronic Signature(s) Signed: 11/12/2022 5:12:55 PM By: Betha Loa Entered By: Betha Loa on 11/11/2022 08:31:05 Mullenbach, Ranen C (  [id number]) [id number] Page 5 of 9 -------------------------------------------------------------------------------- Multi-Disciplinary Care Plan Details Patient Name: Date of Service: [name], [name] 11/11/2022 8:15 A M Medical Record Number: [id number] Patient Account Number: [id number] Date of Birth/Sex: Treating RN: 06/13/1980 (42 y.o. Loel Lofty, Selena Batten Primary Care Riel Hirschman: [name] Other Clinician: Betha Loa Referring Naysha Sholl: Treating Brittaney Beaulieu/Extender: [name] in Treatment: 6 Active Inactive Necrotic Tissue Nursing Diagnoses: Knowledge deficit related to management of necrotic/devitalized tissue Goals: Necrotic/devitalized tissue will be minimized in the wound bed Date Initiated: 09/30/2022 Target Resolution Date: 10/31/2022 Goal Status: Active Interventions: Assess patient pain level pre-, during and post procedure and prior to discharge Notes: Wound/Skin Impairment Nursing Diagnoses: Knowledge deficit related to ulceration/compromised skin  integrity Goals: Patient/caregiver will verbalize understanding of skin care regimen Date Initiated: 09/30/2022 Target Resolution Date: 10/31/2022 Goal Status: Active Ulcer/skin breakdown will have a volume reduction of 30% by week 4 Date Initiated: 09/30/2022 Target Resolution Date: 10/31/2022 Goal Status: Active Ulcer/skin breakdown will have a volume reduction of 50% by week 8 Date Initiated: 09/30/2022 Target Resolution Date: 11/30/2022 Goal Status: Active Ulcer/skin breakdown will have a volume reduction of 80% by week 12 Date Initiated: 09/30/2022 Target Resolution Date: 12/31/2022 Goal Status: Active Ulcer/skin breakdown will heal within 14 weeks Date Initiated: 09/30/2022 Target Resolution Date: 01/31/2023 Goal Status: Active Interventions: Assess patient/caregiver ability to obtain necessary supplies Assess patient/caregiver ability to perform ulcer/skin care regimen upon admission and as needed Assess ulceration(s) every visit Notes: Electronic Signature(s) Signed: 11/12/2022 5:12:55 PM By: Betha Loa Signed: 11/16/2022 9:09:50 AM By: Elliot Gurney, BSN, RN, CWS, Kim RN, BSN Entered By: Betha Loa on 11/11/2022 08:55:22 [name] ([id number]) [id number] Page 6 of 9 -------------------------------------------------------------------------------- Pain Assessment Details Patient Name: Date of Service: TIERNAN, SUTO 11/11/2022 8:15 A M Medical Record Number: [id number] Patient Account Number: [id number] Date of Birth/Sex: Treating RN: 10/20/1980 (42 y.o. [name] Primary Care Shloimy Michalski: [name] Other Clinician: Betha Loa Referring Tabrina Esty: Treating Brenner Visconti/Extender: [name] in Treatment: 6 Active Problems Location of Pain Severity and Description of Pain Patient Has Paino No Site Locations Pain Management and Medication Current Pain Management: Electronic Signature(s) Signed: 11/12/2022  5:12:55 PM By: Betha Loa Signed: 11/16/2022 9:09:50 AM By: Elliot Gurney, BSN, RN, CWS, Kim RN, BSN Entered By: Betha Loa on 11/11/2022 08:25:14 -------------------------------------------------------------------------------- Patient/Caregiver Education Details Patient Name: Date of Service: [name] 6/19/2024andnbsp8:15 A M Medical Record Number: [id number] Patient Account Number: [id number] Date of Birth/Gender: Treating RN: 1981/02/10 (42 y.o. [name] Primary Care Physician: [name] Other Clinician: Betha Loa Referring Physician: Treating Physician/Extender: Gautam, Langhorst ([id number]) [id number] Page 7 of 9 Weeks in Treatment: 6 Education Assessment Education Provided To: Patient Education Topics Provided Wound/Skin Impairment: Handouts: Other: continue wound care as directed Methods: Explain/Verbal Responses: State content correctly Electronic Signature(s) Signed: 11/12/2022 5:12:55 PM By: Betha Loa Entered By: Betha Loa on 11/11/2022 08:55:45 -------------------------------------------------------------------------------- Wound Assessment Details Patient Name: Date of Service: [name], [name] 11/11/2022 8:15 A M Medical Record Number: [id number] Patient Account Number: [id number] Date of Birth/Sex: Treating RN: 07-25-1980 (42 y.o. [name] Primary Care Ezma Rehm: [name] Other Clinician: Betha Loa Referring Jc Veron: Treating Amere Iott/Extender: [name] in Treatment: 6 Wound Status Wound Number: 9 Primary Diabetic Wound/Ulcer of the Lower Extremity Etiology: Wound Location: Left, Lateral Metatarsal head fifth Wound Status: Open Wounding Event: Blister Comorbid Hypertension, Peripheral Venous Disease, Type II Diabetes, Date Acquired: 10/07/2022 History: Osteomyelitis Weeks Of Treatment: 3  Clustered Wound:  No Photos Wound Measurements Length: (cm) 1.4 Width: (cm) 1.8 Depth: (cm) 0.4 Area: (cm) 1.979 Volume: (cm) 0.792 % Reduction in Area: 54.2% % Reduction in Volume: 8.3% Epithelialization: None Wound Description Classification: Unable to visualize wound bed [name], [name] C ([id number]) Wound Margin: Flat and Intact Exudate Amount: Medium Exudate Type: Serosanguineous Exudate Color: red, brown Foul Odor After Cleansing: No [id number] Page 8 of 9 Slough/Fibrino Yes Wound Bed Granulation Amount: Small (1-33%) Exposed Structure Granulation Quality: Pink Fascia Exposed: No Necrotic Amount: Large (67-100%) Fat Layer (Subcutaneous Tissue) Exposed: Yes Necrotic Quality: Eschar, Adherent Slough Tendon Exposed: No Muscle Exposed: No Joint Exposed: No Bone Exposed: No Treatment Notes Wound #9 (Metatarsal head fifth) Wound Laterality: Left, Lateral Cleanser Soap and Water Discharge Instruction: Gently cleanse wound with antibacterial soap, rinse and pat dry prior to dressing wounds Peri-Wound Care Topical Primary Dressing Hydrofera Blue Ready Transfer Foam, 4x5 (in/in) Discharge Instruction: Apply Hydrofera Blue Ready to wound bed as directed Secondary Dressing Kerlix 4.5 x 4.1 (in/yd) Discharge Instruction: Apply Kerlix 4.5 x 4.1 (in/yd) as instructed Secured With ACE WRAP - 29M ACE Elastic Bandage With VELCRO Brand Closure, 4 (in) Compression Wrap Compression Stockings Add-Ons Electronic Signature(s) Signed: 11/12/2022 5:12:55 PM By: Betha Loa Signed: 11/16/2022 9:09:50 AM By: Elliot Gurney, BSN, RN, CWS, Kim RN, BSN Entered By: Betha Loa on 11/11/2022 08:30:12 -------------------------------------------------------------------------------- Vitals Details Patient Name: Date of Service: [name] 11/11/2022 8:15 A M Medical Record Number: [id number] Patient Account Number: [id number] Date of Birth/Sex: Treating RN: 06-12-1980 (42  y.o. Loel Lofty, Selena Batten Primary Care Zara Wendt: [name] Other Clinician: Betha Loa Referring Leondra Cullin: Treating Lener Ventresca/Extender: [name] in Treatment: 6 Vital Signs Time Taken: 08:24 Temperature (F): 97.8 Height (in): 73 Pulse (bpm): 77 Weight (lbs): 330 Respiratory Rate (breaths/min): 18 Body Mass Index (BMI): 43.5 Blood Pressure (mmHg): 113/79 CHARLE[place], [name] ([id number]) [id number].pdf Page 9 of 9 Reference Range: 80 - 120 mg / dl Electronic Signature(s) Signed: 11/12/2022 5:12:55 PM By: Betha Loa Entered By: Betha Loa on 11/11/2022 08:25:08

## 2022-11-16 NOTE — Progress Notes (Signed)
JHONY, ANTRIM ([id number]) [id number] Page 1 of 8 Visit Report for 11/11/2022 Chief Complaint Document Details Patient Name: Date of Service: [name], [name] 11/11/2022 8:15 A M Medical Record Number: [id number] Patient Account Number: [id number] Date of Birth/Sex: Treating RN: [birth year] (42 y.o. [name] Primary Care Provider: [name] Other Clinician: Betha Loa Referring Provider: Treating Provider/Extender: [name] in Treatment: 6 Information Obtained from: Patient Chief Complaint 05/13/2022; bilateral feet wounds 09/30/2022; patient returns to clinic with wounds on his left plantar foot Electronic Signature(s) Signed: 11/12/2022 12:49:28 PM By: Geralyn Corwin DO Entered By: Geralyn Corwin on 11/11/2022 09:13:13 -------------------------------------------------------------------------------- Debridement Details Patient Name: Date of Service: [name] 11/11/2022 8:15 A M Medical Record Number: [id number] Patient Account Number: [id number] Date of Birth/Sex: Treating RN: 1981/01/26 (42 y.o. Loel Lofty, Selena Batten Primary Care Provider: [name] Other Clinician: Betha Loa Referring Provider: Treating Provider/Extender: [name] in Treatment: 6 Debridement Performed for Assessment: Wound #9 Left,Lateral Metatarsal head fifth Performed By: Physician Geralyn Corwin, MD Debridement Type: Debridement Severity of Tissue Pre Debridement: Fat layer exposed Level of Consciousness (Pre-procedure): Awake and Alert Pre-procedure Verification/Time Out Yes - 08:52 Taken: Start Time: 08:52 Percent of Wound Bed Debrided: 100% T Area Debrided (cm): otal 1.98 Tissue and other material debrided: Viable, Non-Viable, Eschar, Slough, Subcutaneous, Slough Level: Skin/Subcutaneous Tissue Debridement Description: Excisional Instrument: Blade,  Forceps Bleeding: Minimum Hemostasis Achieved: Pressure Response to Treatment: Procedure was tolerated well Level of Consciousness (Post- Awake and Alert VAUGHAN, GARFINKLE ([id number]) [id number] Page 2 of 8 Awake and Alert procedure): Post Debridement Measurements of Total Wound Length: (cm) 1.4 Width: (cm) 1.8 Depth: (cm) 0.5 Volume: (cm) 0.99 Character of Wound/Ulcer Post Debridement: Stable Severity of Tissue Post Debridement: Fat layer exposed Post Procedure Diagnosis Same as Pre-procedure Electronic Signature(s) Signed: 11/12/2022 12:49:28 PM By: Geralyn Corwin DO Signed: 11/12/2022 5:12:55 PM By: Betha Loa Signed: 11/16/2022 9:09:50 AM By: Elliot Gurney, BSN, RN, CWS, Kim RN, BSN Entered By: Betha Loa on 11/11/2022 08:54:15 -------------------------------------------------------------------------------- HPI Details Patient Name: Date of Service: [name] 11/11/2022 8:15 A M Medical Record Number: [id number] Patient Account Number: [id number] Date of Birth/Sex: Treating RN: 08-30-1980 (42 y.o. [name] Primary Care Provider: [name] Other Clinician: Betha Loa Referring Provider: Treating Provider/Extender: [name] in Treatment: 6 History of Present Illness HPI Description: 05/13/2022 [name] is a 42 year old male with uncontrolled insulin-dependent type 2 diabetes with last hemoglobin A1c of 11.1, previous feet wounds that led to bilateral great toe amputations, ADHD and bipolar depression that presents the clinic for a 1 month history of non healing ulcers to the Feet bilaterally. His previous bilateral great toe amputation sites have healed. He has been using an antibacterial spray to keep the areas clean. He has not been dressing the wounds. He states that over the past couple days he has had increased redness and warmth to the right foot. He has regular tennis  shoes. He does not use any offloading device. He states he walks around in socks and does not go barefoot. 05/20/2022. This is a 42 year old man with type 2 diabetes and neuropathy. He has wounds on his right first metatarsal head and the base of his right fourth and fifth toes. He has been using Medihoney. Apparently there was a plan to have him casted today. He has had previously amputated first toes bilaterally a year to a year and a half ago by Dr. Lajoyce Corners for underlying osteomyelitis according  to the patient. The patient has not had any foot wear. Because his forefoot is so wide he simply ordered his shoes online. Last week because of redness and warmth on the left foot he was put on Augmentin and doxycycline READMISSION 09/30/2022 This is a 42 year old man who is a type II diabetic with diabetic peripheral neuropathy. He was here for the clinic in the clinic in late September 2023 for 2 visits only and on the left foot had very similar wounds to currently. He tells [name] he left the clinic related to a problem with his underlying bipolar disorder. In any case he still has an area on the left first metatarsal head and at the base of the fourth and fifth toes. This is very similar by my memory to what was present in December 2023. He tells [name] that last week or 2 this became somewhat red and warm. He used doxycycline and amoxicillin for 5 days that he had leftover from a previous visit and that helped. At this point I am not sure about his diabetes control. He had peg assist shoes last time he was here he has not been wearing these Past medical history includes type 2 diabetes with peripheral neuropathy, and bilateral great toe amputations, ADHD, bipolar disorder, ABIs in our clinic were 0.78 on the right he does not have wounds here and 1.03 on the left which is his wounded side 5/15; patient presents for follow-up. He has been using Hydrofera Blue to the wound beds. The lateral left foot wound has  healed. He has 2 remaining wounds. He has no issues or complaints today. 5/29; patient presents for follow-up. He has been using Hydrofera Blue to the wound beds. Unfortunately the lateral left foot wound has reopened. He denies signs of infection. HARSHIL, CAVALLARO ([id number]) [id number] Page 3 of 8 6/5 the patient had wounds on his first and fourth metatarsal heads which have healed. Unfortunately he comes in with erythema around the area on the left fifth metatarsal head. 6/12; patient presents for follow-up. He was prescribed doxycycline at last clinic visit due to increased erythema to the left fifth met head. According to the patient he did not pick this up. A culture was also done that grew Streptococcus sensitive to amoxicillin. This was sent in however the patient did not pick this up either. Wound has declined in appearance and there is increased erythema to the periwound however this is not streaking or progressing compared to last clinic pictures. He knows to go to the ED if he were to have worsening symptoms including increased pain, erythema, warmth or purulent drainage 6/19; patient presents for follow-up. He has been taking Augmentin and doxycycline. His symptoms have improved with decreased erythema and swelling to the left foot/periwound. He has been using Hydrofera Blue. He denies signs of infection. Electronic Signature(s) Signed: 11/12/2022 12:49:28 PM By: Geralyn Corwin DO Entered By: Geralyn Corwin on 11/11/2022 09:14:16 -------------------------------------------------------------------------------- Physical Exam Details Patient Name: Date of Service: RIDDICK, NUON 11/11/2022 8:15 A M Medical Record Number: [id number] Patient Account Number: [id number] Date of Birth/Sex: Treating RN: 12/19/1980 (42 y.o. [name] Primary Care Provider: [name] Other Clinician: Betha Loa Referring Provider: Treating  Provider/Extender: [name] in Treatment: 6 Constitutional . Cardiovascular . Psychiatric . Notes T the left lateral aspect of the left fifth met head there is an open wound with nonviable tissue and granulation tissue. Probes close to bone however bone is o covered at this time.  No signs of surrounding infection including increased warmth, erythema or purulent drainage. Electronic Signature(s) Signed: 11/12/2022 12:49:28 PM By: Geralyn Corwin DO Entered By: Geralyn Corwin on 11/11/2022 09:14:51 -------------------------------------------------------------------------------- Physician Orders Details Patient Name: Date of Service: [name] 11/11/2022 8:15 A M Medical Record Number: [id number] Patient Account Number: [id number] Date of Birth/Sex: Treating RN: [birth year] (42 y.o. [name] Primary Care Provider: [name] Other Clinician: Betha Loa Referring Provider: Treating Provider/Extender: Zayaan, Kozak ([id number]) [id number] Page 4 of 8 Weeks in Treatment: 6 Verbal / Phone Orders: Yes Clinician: Huel Coventry Read Back and Verified: Yes Diagnosis Coding Follow-up Appointments Return Appointment in 1 week. Bathing/ [employer] wounds with antibacterial soap and water. Edema Control - Lymphedema / Segmental Compressive Device / Other Elevate, Exercise Daily and A void Standing for Long Periods of Time. Elevate legs to the level of the heart and pump ankles as often as possible Elevate leg(s) parallel to the floor when sitting. Off-Loading Open toe surgical shoe - left Turn and reposition every 2 hours Medications-Please add to medication list. ntibiotics - continue Augmentin, Doxycycline P.O. A Wound Treatment Wound #9 - Metatarsal head fifth Wound Laterality: Left, Lateral Cleanser: Soap and Water 3 x Per Week/30 Days Discharge  Instructions: Gently cleanse wound with antibacterial soap, rinse and pat dry prior to dressing wounds Prim Dressing: Hydrofera Blue Ready Transfer Foam, 4x5 (in/in) 3 x Per Week/30 Days ary Discharge Instructions: Apply Hydrofera Blue Ready to wound bed as directed Secondary Dressing: Kerlix 4.5 x 4.1 (in/yd) 3 x Per Week/30 Days Discharge Instructions: Apply Kerlix 4.5 x 4.1 (in/yd) as instructed Secured With: ACE WRAP - 33M ACE Elastic Bandage With VELCRO Brand Closure, 4 (in) 3 x Per Week/30 Days Electronic Signature(s) Signed: 11/12/2022 12:49:28 PM By: Geralyn Corwin DO Entered By: Geralyn Corwin on 11/11/2022 09:16:18 -------------------------------------------------------------------------------- Problem List Details Patient Name: Date of Service: [name] 11/11/2022 8:15 A M Medical Record Number: [id number] Patient Account Number: [id number] Date of Birth/Sex: Treating RN: 30-Jun-1980 (42 y.o. [name] Primary Care Provider: [name] Other Clinician: Betha Loa Referring Provider: Treating Provider/Extender: [name] in Treatment: 6 Active Problems ICD-10 Encounter Code Description Active Date MDM Diagnosis E11.621 Type 2 diabetes mellitus with foot ulcer 09/30/2022 No Yes L97.528 Non-pressure chronic ulcer of other part of left foot with other specified 09/30/2022 No Yes severity DARYAN, BUELL ([id number]) [id number] Page 5 of 8 E11.40 Type 2 diabetes mellitus with diabetic neuropathy, unspecified 09/30/2022 No Yes Inactive Problems Resolved Problems Electronic Signature(s) Signed: 11/12/2022 12:49:28 PM By: Geralyn Corwin DO Entered By: Geralyn Corwin on 11/11/2022 09:13:09 -------------------------------------------------------------------------------- Progress Note Details Patient Name: Date of Service: [name] 11/11/2022 8:15 A M Medical Record Number:  [id number] Patient Account Number: [id number] Date of Birth/Sex: Treating RN: Mar 14, 1981 (42 y.o. Loel Lofty, Selena Batten Primary Care Provider: [name] Other Clinician: Betha Loa Referring Provider: Treating Provider/Extender: [name] in Treatment: 6 Subjective Chief Complaint Information obtained from Patient 05/13/2022; bilateral feet wounds 09/30/2022; patient returns to clinic with wounds on his left plantar foot History of Present Illness (HPI) 05/13/2022 Mr. Sigismund Cross is a 42 year old male with uncontrolled insulin-dependent type 2 diabetes with last hemoglobin A1c of 11.1, previous feet wounds that led to bilateral great toe amputations, ADHD and bipolar depression that presents the clinic for a 1 month history of non healing ulcers to the Feet bilaterally. His previous bilateral great toe amputation sites have healed.  He has been using an antibacterial spray to keep the areas clean. He has not been dressing the wounds. He states that over the past couple days he has had increased redness and warmth to the right foot. He has regular tennis shoes. He does not use any offloading device. He states he walks around in socks and does not go barefoot. 05/20/2022. This is a 42 year old man with type 2 diabetes and neuropathy. He has wounds on his right first metatarsal head and the base of his right fourth and fifth toes. He has been using Medihoney. Apparently there was a plan to have him casted today. He has had previously amputated first toes bilaterally a year to a year and a half ago by Dr. Lajoyce Corners for underlying osteomyelitis according to the patient. The patient has not had any foot wear. Because his forefoot is so wide he simply ordered his shoes online. Last week because of redness and warmth on the left foot he was put on Augmentin and doxycycline READMISSION 09/30/2022 This is a 42 year old man who is a type II diabetic with diabetic peripheral  neuropathy. He was here for the clinic in the clinic in late September 2023 for 2 visits only and on the left foot had very similar wounds to currently. He tells [name] he left the clinic related to a problem with his underlying bipolar disorder. In any case he still has an area on the left first metatarsal head and at the base of the fourth and fifth toes. This is very similar by my memory to what was present in December 2023. He tells [name] that last week or 2 this became somewhat red and warm. He used doxycycline and amoxicillin for 5 days that he had leftover from a previous visit and that helped. At this point I am not sure about his diabetes control. He had peg assist shoes last time he was here he has not been wearing these Past medical history includes type 2 diabetes with peripheral neuropathy, and bilateral great toe amputations, ADHD, bipolar disorder, ABIs in our clinic were 0.78 on the right he does not have wounds here and 1.03 on the left which is his wounded side 5/15; patient presents for follow-up. He has been using Hydrofera Blue to the wound beds. The lateral left foot wound has healed. He has 2 remaining wounds. He has no issues or complaints today. 5/29; patient presents for follow-up. He has been using Hydrofera Blue to the wound beds. Unfortunately the lateral left foot wound has reopened. He denies signs of infection. EPIMENIO, SCHETTER ([id number]) [id number] Page 6 of 8 6/5 the patient had wounds on his first and fourth metatarsal heads which have healed. Unfortunately he comes in with erythema around the area on the left fifth metatarsal head. 6/12; patient presents for follow-up. He was prescribed doxycycline at last clinic visit due to increased erythema to the left fifth met head. According to the patient he did not pick this up. A culture was also done that grew Streptococcus sensitive to amoxicillin. This was sent in however the patient did not  pick this up either. Wound has declined in appearance and there is increased erythema to the periwound however this is not streaking or progressing compared to last clinic pictures. He knows to go to the ED if he were to have worsening symptoms including increased pain, erythema, warmth or purulent drainage 6/19; patient presents for follow-up. He has been taking Augmentin and doxycycline. His symptoms have improved with decreased  erythema and swelling to the left foot/periwound. He has been using Hydrofera Blue. He denies signs of infection. Objective Constitutional Vitals Time Taken: 8:24 AM, Height: 73 in, Weight: 330 lbs, BMI: 43.5, Temperature: 97.8 F, Pulse: 77 bpm, Respiratory Rate: 18 breaths/min, Blood Pressure: 113/79 mmHg. General Notes: T the left lateral aspect of the left fifth met head there is an open wound with nonviable tissue and granulation tissue. Probes close to bone o however bone is covered at this time. No signs of surrounding infection including increased warmth, erythema or purulent drainage. Integumentary (Hair, Skin) Wound #9 status is Open. Original cause of wound was Blister. The date acquired was: 10/07/2022. The wound has been in treatment 3 weeks. The wound is located on the Left,Lateral Metatarsal head fifth. The wound measures 1.4cm length x 1.8cm width x 0.4cm depth; 1.979cm^2 area and 0.792cm^3 volume. There is Fat Layer (Subcutaneous Tissue) exposed. There is a medium amount of serosanguineous drainage noted. The wound margin is flat and intact. There is small (1-33%) pink granulation within the wound bed. There is a large (67-100%) amount of necrotic tissue within the wound bed including Eschar and Adherent Slough. Assessment Active Problems ICD-10 Type 2 diabetes mellitus with foot ulcer Non-pressure chronic ulcer of other part of left foot with other specified severity Type 2 diabetes mellitus with diabetic neuropathy, unspecified Patient's wound has  improved in size in appearance since last clinic visit. Infectious symptoms have resolved. However he still has a significant mount of nonviable tissue and this was debrided today. He is close to bone however this does not probe to it. I recommended aggressive offloading with a surgical shoe. If he continues to improve he would benefit from a cast in the near future. I recommended continuing his oral antibiotics. Follow-up in 1 week. Procedures Wound #9 Pre-procedure diagnosis of Wound #9 is a Diabetic Wound/Ulcer of the Lower Extremity located on the Left,Lateral Metatarsal head fifth .Severity of Tissue Pre Debridement is: Fat layer exposed. There was a Excisional Skin/Subcutaneous Tissue Debridement with a total area of 1.98 sq cm performed by Geralyn Corwin, MD. With the following instrument(s): Blade, and Forceps to remove Viable and Non-Viable tissue/material. Material removed includes Eschar, Subcutaneous Tissue, and Slough. A time out was conducted at 08:52, prior to the start of the procedure. A Minimum amount of bleeding was controlled with Pressure. The procedure was tolerated well. Post Debridement Measurements: 1.4cm length x 1.8cm width x 0.5cm depth; 0.99cm^3 volume. Character of Wound/Ulcer Post Debridement is stable. Severity of Tissue Post Debridement is: Fat layer exposed. Post procedure Diagnosis Wound #9: Same as Pre-Procedure Plan Follow-up Appointments: Return Appointment in 1 week. Bathing/ Shower/ Hygiene: Wash wounds with antibacterial soap and water. Edema Control - Lymphedema / Segmental Compressive Device / Other: Elevate, Exercise Daily and Avoid Standing for Long Periods of Time. Elevate legs to the level of the heart and pump ankles as often as possible Elevate leg(s) parallel to the floor when sitting. Off-LoadingRUNE, MENDEZ ([id number]) [id number] Page 7 of 8 Open toe surgical shoe - left Turn and reposition every 2  hours Medications-Please add to medication list.: P.O. Antibiotics - continue Augmentin, Doxycycline WOUND #9: - Metatarsal head fifth Wound Laterality: Left, Lateral Cleanser: Soap and Water 3 x Per Week/30 Days Discharge Instructions: Gently cleanse wound with antibacterial soap, rinse and pat dry prior to dressing wounds Prim Dressing: Hydrofera Blue Ready Transfer Foam, 4x5 (in/in) 3 x Per Week/30 Days ary Discharge Instructions: Apply Hydrofera Blue Ready to wound  bed as directed Secondary Dressing: Kerlix 4.5 x 4.1 (in/yd) 3 x Per Week/30 Days Discharge Instructions: Apply Kerlix 4.5 x 4.1 (in/yd) as instructed Secured With: ACE WRAP - 58M ACE Elastic Bandage With VELCRO Brand Closure, 4 (in) 3 x Per Week/30 Days 1. In office sharp debridement 2. Hydrofera Blue 3. Aggressive offloadingsurgical shoe 4. Continue oral antibiotics 5. Follow-up in 1 week Electronic Signature(s) Signed: 11/12/2022 12:49:28 PM By: Geralyn Corwin DO Entered By: Geralyn Corwin on 11/11/2022 09:15:58 -------------------------------------------------------------------------------- ROS/PFSH Details Patient Name: Date of Service: [name] 11/11/2022 8:15 A M Medical Record Number: [id number] Patient Account Number: [id number] Date of Birth/Sex: Treating RN: 04/22/1981 (42 y.o. [name] Primary Care Provider: [name] Other Clinician: Betha Loa Referring Provider: Treating Provider/Extender: [name] in Treatment: 6 Cardiovascular Medical History: Positive for: Hypertension; Peripheral Venous Disease Endocrine Medical History: Positive for: Type II Diabetes Time with diabetes: 2000 Treated with: Insulin, Oral agents Blood sugar tested every day: No Musculoskeletal Medical History: Positive for: Osteomyelitis Immunizations Pneumococcal Vaccine: Received Pneumococcal Vaccination: No Implantable Devices No devices added Family and  Social History Never smoker; Marital Status - Single; Alcohol Use: Never; Drug Use: No History; Caffeine Use: Rarely RUTGER, SALTON ([id number]) [id number] Page 8 of 8 Electronic Signature(s) Signed: 11/12/2022 12:49:28 PM By: Geralyn Corwin DO Signed: 11/16/2022 9:09:50 AM By: Elliot Gurney, BSN, RN, CWS, Kim RN, BSN Entered By: Geralyn Corwin on 11/11/2022 09:16:27 -------------------------------------------------------------------------------- SuperBill Details Patient Name: Date of Service: [name] 11/11/2022 Medical Record Number: [id number] Patient Account Number: [id number] Date of Birth/Sex: Treating RN: 02-17-1981 (42 y.o. Loel Lofty, Selena Batten Primary Care Provider: [name] Other Clinician: Betha Loa Referring Provider: Treating Provider/Extender: [name] in Treatment: 6 Diagnosis Coding ICD-10 Codes Code Description [phone] Type 2 diabetes mellitus with foot ulcer 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rity E11.40 Type 2 diabetes mellitus with diabetic neuropathy, unspecified Facility Procedures : CPT4 Code: [id number] Description: 11042 - DEB SUBQ TISSUE 20 SQ CM/< ICD-10 Diagnosis Description E11.621 Type 2 diabetes mellitus with foot ulcer 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 Modifier: rity Quantity: 1 Physician Procedures : CPT4 Code Description Modifier [id number] 11042 - WC PHYS SUBQ TISS 20 SQ CM ICD-10 Diagnosis Description E11.621 Type 2 diabetes mellitus with foot ulcer 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rity Quantity: 1 Electronic Signature(s) Signed: 11/12/2022 12:49:28 PM By: Geralyn Corwin DO Entered By: Geralyn Corwin on 11/11/2022 09:16:09

## 2022-11-18 ENCOUNTER — Encounter (HOSPITAL_BASED_OUTPATIENT_CLINIC_OR_DEPARTMENT_OTHER): Payer: 59 | Admitting: Internal Medicine

## 2022-11-18 ENCOUNTER — Other Ambulatory Visit (HOSPITAL_COMMUNITY): Payer: Self-pay

## 2022-11-18 DIAGNOSIS — I10 Essential (primary) hypertension: Secondary | ICD-10-CM | POA: Diagnosis not present

## 2022-11-18 DIAGNOSIS — L97528 Non-pressure chronic ulcer of other part of left foot with other specified severity: Secondary | ICD-10-CM

## 2022-11-18 DIAGNOSIS — F909 Attention-deficit hyperactivity disorder, unspecified type: Secondary | ICD-10-CM | POA: Diagnosis not present

## 2022-11-18 DIAGNOSIS — E11621 Type 2 diabetes mellitus with foot ulcer: Secondary | ICD-10-CM

## 2022-11-18 DIAGNOSIS — F319 Bipolar disorder, unspecified: Secondary | ICD-10-CM | POA: Diagnosis not present

## 2022-11-18 DIAGNOSIS — Z794 Long term (current) use of insulin: Secondary | ICD-10-CM | POA: Diagnosis not present

## 2022-11-18 DIAGNOSIS — Z89411 Acquired absence of right great toe: Secondary | ICD-10-CM | POA: Diagnosis not present

## 2022-11-18 DIAGNOSIS — Z89412 Acquired absence of left great toe: Secondary | ICD-10-CM | POA: Diagnosis not present

## 2022-11-18 DIAGNOSIS — E1142 Type 2 diabetes mellitus with diabetic polyneuropathy: Secondary | ICD-10-CM | POA: Diagnosis not present

## 2022-11-18 MED ORDER — AMOXICILLIN-POT CLAVULANATE 875-125 MG PO TABS
1.0000 | ORAL_TABLET | Freq: Two times a day (BID) | ORAL | 0 refills | Status: DC
  Filled 2022-11-18: qty 28, 14d supply, fill #0

## 2022-11-18 MED ORDER — DOXYCYCLINE HYCLATE 100 MG PO TABS
100.0000 mg | ORAL_TABLET | Freq: Two times a day (BID) | ORAL | 0 refills | Status: DC
  Filled 2022-11-18: qty 28, 14d supply, fill #0

## 2022-11-18 NOTE — Progress Notes (Signed)
[name], [name] ([id number]) [id number] Page 1 of 11 Visit Report for 10/28/2022 Arrival Information Details Patient Name: Date of Service: [name], [name] 10/28/2022 10:15 A M Medical Record Number: [id number] Patient Account Number: [id number] Date of Birth/Sex: Treating RN: 11/25/1980 (42 y.o. [name]) [name] Primary Care Trellis Vanoverbeke: [name] Other Clinician: Referring Nishaan Stanke: Treating Elon Lomeli/Extender: RO BSO [name], [name] in Treatment: 4 Visit Information History Since Last Visit Added or deleted any medications: No Patient Arrived: Ambulatory Any new allergies or adverse reactions: No Arrival Time: 10:19 Had a fall or experienced change in No Accompanied By: self activities of daily living that may affect Transfer Assistance: None risk of falls: Patient Identification Verified: Yes Signs or symptoms of abuse/neglect since last visito No Secondary Verification Process Completed: Yes Hospitalized since last visit: No Patient Requires Transmission-Based Precautions: No Implantable device outside of the clinic excluding No Patient Has Alerts: Yes cellular tissue based products placed in the center Patient Alerts: Left ABI 1.03 05/13/22 since last visit: Right ABI .78 05/13/22 Has Dressing in Place as Prescribed: Yes Has Compression in Place as Prescribed: Yes Pain Present Now: Yes Electronic Signature(s) Signed: 11/18/2022 11:52:51 AM By: [name] RN Entered By: [name] on 10/28/2022 10:27:55 -------------------------------------------------------------------------------- Clinic Level of Care Assessment Details Patient Name: Date of Service: [name] 10/28/2022 10:15 A M Medical Record Number: [id number] Patient Account Number: [id number] Date of Birth/Sex: Treating RN: 1980-10-23 (42 y.o. [name] Primary Care Adajah Cocking: [name] Other Clinician: Referring Laurena Valko: Treating  Nadelyn Enriques/Extender: [name], [name] in Treatment: 4 Clinic Level of Care Assessment Items TOOL 4 Quantity Score X- 1 0 Use when only an EandM is performed on FOLLOW-UP visit ASSESSMENTS - Nursing Assessment / Reassessment X- 1 10 Reassessment of Co-morbidities (includes updates in patient status) X- 1 5 Reassessment of Adherence to Treatment Plan [name], [name] ([id number]) [id number] Page 2 of 11 ASSESSMENTS - Wound and Skin A ssessment / Reassessment []  - 0 Simple Wound Assessment / Reassessment - one wound X- 3 5 Complex Wound Assessment / Reassessment - multiple wounds []  - 0 Dermatologic / Skin Assessment (not related to wound area) ASSESSMENTS - Focused Assessment []  - 0 Circumferential Edema Measurements - multi extremities []  - 0 Nutritional Assessment / Counseling / Intervention []  - 0 Lower Extremity Assessment (monofilament, tuning fork, pulses) []  - 0 Peripheral Arterial Disease Assessment (using hand held doppler) ASSESSMENTS - Ostomy and/or Continence Assessment and Care []  - 0 Incontinence Assessment and Management []  - 0 Ostomy Care Assessment and Management (repouching, etc.) PROCESS - Coordination of Care X - Simple Patient / Family Education for ongoing care 1 15 []  - 0 Complex (extensive) Patient / Family Education for ongoing care []  - 0 Staff obtains Chiropractor, Records, T Results / Process Orders est []  - 0 Staff telephones HHA, Nursing Homes / Clarify orders / etc []  - 0 Routine Transfer to another Facility (non-emergent condition) []  - 0 Routine Hospital Admission (non-emergent condition) []  - 0 New Admissions / Manufacturing engineer / Ordering NPWT Apligraf, etc. , []  - 0 Emergency Hospital Admission (emergent condition) X- 1 10 Simple Discharge Coordination []  - 0 Complex (extensive) Discharge Coordination PROCESS - Special Needs []  - 0 Pediatric / Minor Patient  Management []  - 0 Isolation Patient Management []  - 0 Hearing / Language / Visual special needs []  - 0 Assessment of Community assistance (transportation, D/C planning, etc.) []  - 0 Additional assistance /  Altered mentation []  - 0 Support Surface(s) Assessment (bed, cushion, seat, etc.) INTERVENTIONS - Wound Cleansing / Measurement []  - 0 Simple Wound Cleansing - one wound X- 3 5 Complex Wound Cleansing - multiple wounds X- 1 5 Wound Imaging (photographs - any number of wounds) []  - 0 Wound Tracing (instead of photographs) []  - 0 Simple Wound Measurement - one wound X- 3 5 Complex Wound Measurement - multiple wounds INTERVENTIONS - Wound Dressings X - Small Wound Dressing one or multiple wounds 1 10 []  - 0 Medium Wound Dressing one or multiple wounds []  - 0 Large Wound Dressing one or multiple wounds []  - 0 Application of Medications - topical []  - 0 Application of Medications - injection INTERVENTIONS - Miscellaneous []  - 0 External ear exam [name], [name] ([id number]) [id number] Page 3 of 11 []  - 0 Specimen Collection (cultures, biopsies, blood, body fluids, etc.) []  - 0 Specimen(s) / Culture(s) sent or taken to Lab for analysis []  - 0 Patient Transfer (multiple staff / Michiel Sites Lift / Similar devices) []  - 0 Simple Staple / Suture removal (25 or less) []  - 0 Complex Staple / Suture removal (26 or more) []  - 0 Hypo / Hyperglycemic Management (close monitor of Blood Glucose) []  - 0 Ankle / Brachial Index (ABI) - do not check if billed separately X- 1 5 Vital Signs Has the patient been seen at the hospital within the last three years: Yes Total Score: 105 Level Of Care: New/Established - Level 3 Electronic Signature(s) Signed: 11/18/2022 11:52:51 AM By: [name] RN Entered By: [name] on 10/30/2022 09:32:20 -------------------------------------------------------------------------------- Encounter Discharge Information  Details Patient Name: Date of Service: [name] 10/28/2022 10:15 A M Medical Record Number: [id number] Patient Account Number: [id number] Date of Birth/Sex: Treating RN: Jun 06, 1980 (42 y.o. [name] Primary Care Merita Hawks: [name] Other Clinician: Referring Shelton Soler: Treating Lavora Brisbon/Extender: [name], [name] in Treatment: 4 Encounter Discharge Information Items Discharge Condition: Stable Ambulatory Status: Ambulatory Discharge Destination: Home Transportation: Private Auto Accompanied By: self Schedule Follow-up Appointment: Yes Clinical Summary of Care: Electronic Signature(s) Signed: 10/30/2022 9:33:17 AM By: [name] RN Entered By: [name] on 10/30/2022 09:33:17 Lower Extremity Assessment Details -------------------------------------------------------------------------------- [name] ([id number]) [id number] Page 4 of 11 Patient Name: Date of Service: [name], [name] 10/28/2022 10:15 A M Medical Record Number: [id number] Patient Account Number: [id number] Date of Birth/Sex: Treating RN: 09-28-1980 (42 y.o. [name]) [name] Primary Care Mccall Lomax: [name] Other Clinician: Referring Chanse Kagel: Treating Cannon Quinton/Extender: RO BSO [name], [name] in Treatment: 4 Electronic Signature(s) Signed: 11/18/2022 11:52:51 AM By: [name] RN Entered By: [name] on 10/28/2022 10:28:51 -------------------------------------------------------------------------------- Multi Wound Chart Details Patient Name: Date of Service: [name] 10/28/2022 10:15 A M Medical Record Number: [id number] Patient Account Number: [id number] Date of Birth/Sex: Treating RN: 1981/03/26 (42 y.o. [name] Primary Care Darlyn Repsher: [name] Other Clinician: Referring [place]: Treating Zailyn Rowser/Extender: RO BSO [name], [name] [name], [name] in  Treatment: 4 Vital Signs Height(in): 73 Pulse(bpm): 86 Weight(lbs): 330 Blood Pressure(mmHg): 149/85 Body Mass Index(BMI): 43.5 Temperature(F): 97.8 Respiratory Rate(breaths/min): 16 [6:Photos:] Left Metatarsal head first Left Metatarsal head fourth Left, Lateral Metatarsal head fifth Wound Location: Gradually Appeared Gradually Appeared Blister Wounding Event: Diabetic Wound/Ulcer of the Lower Diabetic Wound/Ulcer of the Lower Diabetic Wound/Ulcer of the Lower Primary Etiology: Extremity Extremity Extremity Hypertension, Peripheral Venous Hypertension, Peripheral Venous Hypertension, Peripheral Venous Comorbid History: Disease, Type II  Diabetes, Disease, Type II Diabetes, Disease, Type II Diabetes, Osteomyelitis Osteomyelitis Osteomyelitis 02/22/2022 02/22/2022 10/07/2022 Date Acquired: 4 4 1  Weeks of Treatment: Open Open Open Wound Status: No No No Wound Recurrence: Yes Yes No Pending A mputation on Presentation: 0x0x0 0x0x0 2x2x0.4 Measurements L x W x D (cm) 0 0 3.142 A (cm) : rea 0 0 1.257 Volume (cm) : 100.00% 100.00% 27.30% % Reduction in A rea: 100.00% 100.00% -45.50% % Reduction in Volume: 12 Starting Position 1 (o'clock): 12 Ending Position 1 (o'clock): 0.2 Maximum Distance 1 (cm): No No Yes Undermining: Grade 1 Grade 1 Unable to visualize wound bed Classification: None Present None Present Medium Exudate A mount: N/A N/A Serosanguineous Exudate TypeDADE, RODIN ([id number]) [id number] Page 5 of 11 N/A N/A red, brown Exudate Color: Indistinct, nonvisible N/A Flat and Intact Wound Margin: None Present (0%) None Present (0%) Small (1-33%) Granulation Amount: N/A N/A Pink Granulation Quality: None Present (0%) None Present (0%) Large (67-100%) Necrotic Amount: N/A N/A Eschar, Adherent Slough Necrotic Tissue: Fascia: No Fascia: No Fat Layer (Subcutaneous Tissue): Yes Exposed Structures: Fat Layer  (Subcutaneous Tissue): No Fat Layer (Subcutaneous Tissue): No Fascia: No Tendon: No Tendon: No Tendon: No Muscle: No Muscle: No Muscle: No Joint: No Joint: No Joint: No Bone: No Bone: No Bone: No Large (67-100%) Large (67-100%) None Epithelialization: Treatment Notes Electronic Signature(s) Signed: 11/18/2022 11:52:51 AM By: [name] RN Entered By: [name] on 10/28/2022 10:28:56 -------------------------------------------------------------------------------- Multi-Disciplinary Care Plan Details Patient Name: Date of Service: [name] 10/28/2022 10:15 A M Medical Record Number: [id number] Patient Account Number: [id number] Date of Birth/Sex: Treating RN: 03-Dec-1980 (42 y.o. [name] Primary Care Tarique Loveall: [name] Other Clinician: Referring Ibn Stief: Treating Cyruss Arata/Extender: [name], [name] in Treatment: 4 Active Inactive Necrotic Tissue Nursing Diagnoses: Knowledge deficit related to management of necrotic/devitalized tissue Goals: Necrotic/devitalized tissue will be minimized in the wound bed Date Initiated: 09/30/2022 Target Resolution Date: 10/31/2022 Goal Status: Active Interventions: Assess patient pain level pre-, during and post procedure and prior to discharge Notes: Wound/Skin Impairment Nursing Diagnoses: Knowledge deficit related to ulceration/compromised skin integrity Goals: Patient/caregiver will verbalize understanding of skin care regimen Date Initiated: 09/30/2022 Target Resolution Date: 10/31/2022 Goal Status: Active Ulcer/skin breakdown will have a volume reduction of 30% by week 4 Date Initiated: 09/30/2022 Target Resolution Date: 10/31/2022 Goal Status: Active Ulcer/skin breakdown will have a volume reduction of 50% by week 8 Date Initiated: 09/30/2022 Target Resolution Date: 11/30/2022 Goal Status: Active JAIKOB, BORGWARDT ([id number]) [id number] Page 6 of  11 Ulcer/skin breakdown will have a volume reduction of 80% by week 12 Date Initiated: 09/30/2022 Target Resolution Date: 12/31/2022 Goal Status: Active Ulcer/skin breakdown will heal within 14 weeks Date Initiated: 09/30/2022 Target Resolution Date: 01/31/2023 Goal Status: Active Interventions: Assess patient/caregiver ability to obtain necessary supplies Assess patient/caregiver ability to perform ulcer/skin care regimen upon admission and as needed Assess ulceration(s) every visit Notes: Electronic Signature(s) Signed: 10/30/2022 9:32:39 AM By: [name] RN Entered By: [name] on 10/30/2022 09:32:39 -------------------------------------------------------------------------------- Pain Assessment Details Patient Name: Date of Service: BARTOLO, MONTANYE 10/28/2022 10:15 A M Medical Record Number: [id number] Patient Account Number: [id number] Date of Birth/Sex: Treating RN: [birth year] (42 y.o. [name] Primary Care Annebelle Bostic: [name] Other Clinician: Referring Giannamarie Paulus: Treating Cherisse Carrell/Extender: [name], [name] in Treatment: 4 Active Problems Location of Pain Severity and Description of Pain Patient Has Paino Yes Site Locations With Dressing Change:  Yes Duration of the Pain. Constant / Intermittento Intermittent How Long Does it Lasto Hours: Minutes: 15 Rate the pain. Current Pain Level: 2 Worst Pain Level: 4 Least Pain Level: 0 Tolerable Pain Level: 5 Character of Pain Describe the Pain: Throbbing Pain Management and Medication Current Pain Management: Medication: Yes Cold Application: No Rest: Yes Massage: No Activity: No T.E.N.S.: No Heat Application: No Leg drop or elevation: No Is the Current Pain Management Adequate: Inadequate [name], [name] ([id number]) [id number] Page 7 of 11 How does your wound impact your activities of daily livingo Sleep: Yes Bathing: No Appetite:  No Relationship With Others: No Bladder Continence: No Emotions: No Bowel Continence: No Work: No Toileting: No Drive: No Dressing: No Hobbies: No Electronic Signature(s) Signed: 11/18/2022 11:52:51 AM By: [name] RN Entered By: [name] on 10/28/2022 10:28:42 -------------------------------------------------------------------------------- Patient/Caregiver Education Details Patient Name: Date of Service: [name] 6/5/2024andnbsp10:15 A M Medical Record Number: [id number] Patient Account Number: [id number] Date of Birth/Gender: Treating RN: Aug 22, 1980 (42 y.o. [name] Primary Care Physician: [name] Other Clinician: Referring Physician: Treating Physician/Extender: [name], [name] in Treatment: 4 Education Assessment Education Provided To: Patient Education Topics Provided Nutrition: Handouts: Elevated Blood Sugars: How Do They Affect Wound Healing Methods: Explain/Verbal Responses: State content correctly Electronic Signature(s) Signed: 11/18/2022 11:52:51 AM By: [name] RN Entered By: [name] on 10/28/2022 10:29:37 -------------------------------------------------------------------------------- Wound Assessment Details Patient Name: Date of Service: [name], [name] 10/28/2022 10:15 A M Medical Record Number: [id number] Patient Account Number: [id number] Date of Birth/Sex: Treating RN: 23-May-1981 (42 y.o. [name] Primary Care Ailee Pates: [name] Other Clinician: Referring Joyanna Kleman: Treating Bronwyn Belasco/Extender: Chauncey Mann, [name] in Treatment: 4 [name], [name] ([id number]) [id number] Page 8 of 11 Wound Status Wound Number: 6 Primary Diabetic Wound/Ulcer of the Lower Extremity Etiology: Wound Location: Left Metatarsal head first Wound Status: Open Wounding Event: Gradually Appeared Comorbid Hypertension, Peripheral  Venous Disease, Type II Diabetes, Date Acquired: 02/22/2022 History: Osteomyelitis Weeks Of Treatment: 4 Clustered Wound: No Pending Amputation On Presentation Photos Wound Measurements Length: (cm) Width: (cm) Depth: (cm) Area: (cm) Volume: (cm) 0 % Reduction in Area: 100% 0 % Reduction in Volume: 100% 0 Epithelialization: Large (67-100%) 0 Tunneling: No 0 Undermining: No Wound Description Classification: Grade 1 Wound Margin: Indistinct, nonvisible Exudate Amount: None Present Foul Odor After Cleansing: No Slough/Fibrino No Wound Bed Granulation Amount: None Present (0%) Exposed Structure Necrotic Amount: None Present (0%) Fascia Exposed: No Fat Layer (Subcutaneous Tissue) Exposed: No Tendon Exposed: No Muscle Exposed: No Joint Exposed: No Bone Exposed: No Electronic Signature(s) Signed: 11/18/2022 11:52:51 AM By: [name] RN Entered By: [name] on 10/28/2022 10:26:43 -------------------------------------------------------------------------------- Wound Assessment Details Patient Name: Date of Service: [name] 10/28/2022 10:15 A M Medical Record Number: [id number] Patient Account Number: [id number] Date of Birth/Sex: Treating RN: 29-Mar-1981 (42 y.o. [name] Primary Care [name]: [name] Other Clinician: Referring Jiles Goya: Treating Vaibhav Fogleman/Extender: RO BSO [name], [name] in Treatment: 4 Wound Status [name], [name] ([id number]) [id number] Page 9 of 11 Wound Number: 7 Primary Diabetic Wound/Ulcer of the Lower Extremity Etiology: Wound Location: Left Metatarsal head fourth Wound Status: Open Wounding Event: Gradually Appeared Comorbid Hypertension, Peripheral Venous Disease, Type II Diabetes, Date Acquired: 02/22/2022 History: Osteomyelitis Weeks Of Treatment: 4 Clustered Wound: No Pending Amputation On Presentation Photos Wound Measurements Length: (cm) Width:  (cm) Depth: (cm) Area: (cm) Volume: (cm) 0 %  Reduction in Area: 100% 0 % Reduction in Volume: 100% 0 Epithelialization: Large (67-100%) 0 Tunneling: No 0 Undermining: No Wound Description Classification: Grade 1 Exudate Amount: None Present Foul Odor After Cleansing: No Slough/Fibrino No Wound Bed Granulation Amount: None Present (0%) Exposed Structure Necrotic Amount: None Present (0%) Fascia Exposed: No Fat Layer (Subcutaneous Tissue) Exposed: No Tendon Exposed: No Muscle Exposed: No Joint Exposed: No Bone Exposed: No Electronic Signature(s) Signed: 11/18/2022 11:52:51 AM By: [name] RN Entered By: [name] on 10/28/2022 10:27:08 -------------------------------------------------------------------------------- Wound Assessment Details Patient Name: Date of Service: [name] 10/28/2022 10:15 A M Medical Record Number: [id number] Patient Account Number: [id number] Date of Birth/Sex: Treating RN: 22-Dec-1980 (42 y.o. [name] Primary Care [name]: [name] Other Clinician: Referring [name]: Treating [name]/Extender: [name], [name] [name], [name] in Treatment: 4 Wound Status Wound Number: 9 Primary Diabetic Wound/Ulcer of the Lower Extremity Etiology: Wound Location: Left, Lateral Metatarsal head fifth Wound Status: Open Wounding Event: [name], [name] ([id number]) [id number] Page 10 of 11 Comorbid Hypertension, Peripheral Venous Disease, Type II Diabetes, Date Acquired: 10/07/2022 History: Osteomyelitis Weeks Of Treatment: 1 Clustered Wound: No Photos Wound Measurements Length: (cm) 2 Width: (cm) 2 Depth: (cm) 0.4 Area: (cm) 3.142 Volume: (cm) 1.257 % Reduction in Area: 27.3% % Reduction in Volume: -45.5% Epithelialization: None Tunneling: No Undermining: Yes Starting Position (o'clock): 12 Ending Position (o'clock): 12 Maximum Distance: (cm) 0.2 Wound  Description Classification: Unable to visualize wound bed Wound Margin: Flat and Intact Exudate Amount: Medium Exudate Type: Serosanguineous Exudate Color: red, brown Foul Odor After Cleansing: No Slough/Fibrino Yes Wound Bed Granulation Amount: Small (1-33%) Exposed Structure Granulation Quality: Pink Fascia Exposed: No Necrotic Amount: Large (67-100%) Fat Layer (Subcutaneous Tissue) Exposed: Yes Necrotic Quality: Eschar, Adherent Slough Tendon Exposed: No Muscle Exposed: No Joint Exposed: No Bone Exposed: No Electronic Signature(s) Signed: 11/18/2022 11:52:51 AM By: [name] RN Entered By: [name] on 10/28/2022 10:27:49 -------------------------------------------------------------------------------- Vitals Details Patient Name: Date of Service: [name] 10/28/2022 10:15 A M Medical Record Number: [id number] Patient Account Number: [id number] Date of Birth/Sex: Treating RN: 09-15-1980 (42 y.o. [name] Primary Care Keishawn Rajewski: [name] Other Clinician: Referring Aeisha Minarik: Treating Tonie Vizcarrondo/Extender: RO BSO [name], [name] in Treatment: 4 Vital Signs [name], [name] C ([id number]) [id number] Page 11 of 11 Time Taken: 10:22 Temperature (F): 97.8 Height (in): 73 Pulse (bpm): 86 Weight (lbs): 330 Respiratory Rate (breaths/min): 16 Body Mass Index (BMI): 43.5 Blood Pressure (mmHg): 149/85 Reference Range: 80 - 120 mg / dl Electronic Signature(s) Signed: 11/18/2022 11:52:51 AM By: [name] RN Entered By: [name] on 10/28/2022 10:22:52

## 2022-11-19 NOTE — Progress Notes (Signed)
[name], [name] ([id number]) [id number] Page 1 of 10 Visit Report for 11/18/2022 Arrival Information Details Patient Name: Date of Service: [name], [name] 11/18/2022 8:15 A M Medical Record Number: [id number] Patient Account Number: [id number] Date of Birth/Sex: Treating RN: Jan 15, 1981 (42 y.o. [name] Primary Care [name]: [name] Other Clinician: Betha [name] Referring [name]: Treating [name]/Extender: [name] in Treatment: 7 Visit Information History Since Last Visit All ordered tests and consults were completed: No Patient Arrived: Ambulatory Added or deleted any medications: No Arrival Time: 08:11 Any new allergies or adverse reactions: No Transfer Assistance: None Had a fall or experienced change in No Patient Identification Verified: Yes activities of daily living that may affect Secondary Verification Process Completed: Yes risk of falls: Patient Requires Transmission-Based Precautions: No Signs or symptoms of abuse/neglect since No Patient Has Alerts: Yes last visito Patient Alerts: Left ABI 1.03 05/13/22 Hospitalized since last visit: No Right ABI .78 05/13/22 Implantable device outside of the clinic No excluding cellular tissue based products placed in the center since last visit: Has Dressing in Place as Prescribed: Yes Has Footwear/Offloading in Place as Yes Prescribed: Left: Surgical Shoe with Pressure Relief Insole Pain Present Now: No Electronic Signature(s) Signed: 11/18/2022 5:16:31 PM By: [name] Entered By: [name] on 11/18/2022 08:17:45 -------------------------------------------------------------------------------- Clinic Level of Care Assessment Details Patient Name: Date of Service: [name], [name] 11/18/2022 8:15 A M Medical Record Number: [id number] Patient Account Number: [id number] Date of Birth/Sex: Treating RN: [birth year] (42 y.o. [name] Primary Care [name]: [name] Other Clinician: Betha [name] Referring [name]: Treating [name]/Extender: [name] in Treatment: 7 Clinic Level of Care Assessment Items TOOL 1 Quantity Score KAYSON, TASKER ([id number]) [phone].pdf Page 2 of 10 []  - 0 Use when EandM and Procedure is performed on INITIAL visit ASSESSMENTS - Nursing Assessment / Reassessment []  - 0 General Physical Exam (combine w/ comprehensive assessment (listed just below) when performed on new pt. evals) []  - 0 Comprehensive Assessment (HX, ROS, Risk Assessments, Wounds Hx, etc.) ASSESSMENTS - Wound and Skin Assessment / Reassessment []  - 0 Dermatologic / Skin Assessment (not related to wound area) ASSESSMENTS - Ostomy and/or Continence Assessment and Care []  - 0 Incontinence Assessment and Management []  - 0 Ostomy Care Assessment and Management (repouching, etc.) PROCESS - Coordination of Care []  - 0 Simple Patient / Family Education for ongoing care []  - 0 Complex (extensive) Patient / Family Education for ongoing care []  - 0 Staff obtains Chiropractor, Records, T Results / Process Orders est []  - 0 Staff telephones HHA, Nursing Homes / Clarify orders / etc []  - 0 Routine Transfer to another Facility (non-emergent condition) []  - 0 Routine Hospital Admission (non-emergent condition) []  - 0 New Admissions / Manufacturing engineer / Ordering NPWT Apligraf, etc. , []  - 0 Emergency Hospital Admission (emergent condition) PROCESS - Special Needs []  - 0 Pediatric / Minor Patient Management []  - 0 Isolation Patient Management []  - 0 Hearing / Language / Visual special needs []  - 0 Assessment of Community assistance (transportation, D/[name] planning, etc.) []  - 0 Additional assistance / Altered mentation []  - 0 Support Surface(s) Assessment (bed, cushion, seat, etc.) INTERVENTIONS - Miscellaneous []  - 0 External ear  exam []  - 0 Patient Transfer (multiple staff / Nurse, adult / Similar devices) []  - 0 Simple Staple / Suture removal (25 or less) []  - 0 Complex Staple / Suture removal (26 or more) []  - 0 Hypo/Hyperglycemic Management (do  not check if billed separately) []  - 0 Ankle / Brachial Index (ABI) - do not check if billed separately Has the patient been seen at the hospital within the last three years: Yes Total Score: 0 Level Of Care: ____ Electronic Signature(s) Signed: 11/18/2022 5:16:31 PM By: [name] Entered By: [name] on 11/18/2022 08:59:10 Encounter Discharge Information Details -------------------------------------------------------------------------------- [name] ([id number]) [id number] Page 3 of 10 Patient Name: Date of Service: [name], [name] 11/18/2022 8:15 A M Medical Record Number: [id number] Patient Account Number: [id number] Date of Birth/Sex: Treating RN: 1980/07/15 (42 y.o. [name] Primary Care Berlyn Malina: [name] Other Clinician: Betha [name] Referring Yanelie Abraha: Treating Sebasthian Stailey/Extender: [name] in Treatment: 7 Encounter Discharge Information Items Post Procedure Vitals Discharge Condition: Stable Temperature (F): 97.9 Ambulatory Status: Ambulatory Pulse (bpm): 88 Discharge Destination: Home Respiratory Rate (breaths/min): 18 Transportation: Private Auto Blood Pressure (mmHg): 114/80 Accompanied By: self Schedule Follow-up Appointment: Yes Clinical Summary of Care: Electronic Signature(s) Signed: 11/18/2022 5:16:31 PM By: [name] Entered By: [name] on 11/18/2022 09:18:08 -------------------------------------------------------------------------------- Lower Extremity Assessment Details Patient Name: Date of Service: [name], [name] 11/18/2022 8:15 A M Medical Record Number: [id number] Patient Account Number: [id number] Date of  Birth/Sex: Treating RN: Jul 20, 1980 (42 y.o. [name] Primary Care Antoniette Peake: [name] Other Clinician: Betha [name] Referring [name]: Treating [name]/Extender: [name] in Treatment: 7 Edema Assessment Assessed: Kyra Searles: Yes] [Right: No] Edema: [Left: Ye] [Right: s] Calf Left: Right: Point of Measurement: 38 cm From Medial Instep 42 cm Ankle Left: Right: Point of Measurement: 10 cm From Medial Instep 26 cm Vascular Assessment Pulses: Dorsalis Pedis Palpable: [Left:Yes] Electronic Signature(s) Signed: 11/18/2022 5:16:31 PM By: [name] Signed: 11/19/2022 4:37:47 PM By: Elliot Gurney, BSN, RN, CWS, Kim RN, BSN Entered By: [name] on 11/18/2022 08:31:17 [name] ([id number]) [id number] Page 4 of 10 -------------------------------------------------------------------------------- Multi Wound Chart Details Patient Name: Date of Service: [name], [name] 11/18/2022 8:15 A M Medical Record Number: [id number] Patient Account Number: [id number] Date of Birth/Sex: Treating RN: Jul 20, 1980 (42 y.o. [name] Primary Care [name]: [name] Other Clinician: Betha [name] Referring Harini Dearmond: Treating Harneet Noblett/Extender: [name] in Treatment: 7 Vital Signs Height(in): 73 Pulse(bpm): 88 Weight(lbs): 330 Blood Pressure(mmHg): 114/80 Body Mass Index(BMI): 43.5 Temperature(F): 97.9 Respiratory Rate(breaths/min): 18 [10:Photos:] [N/A:N/A] Left Amputation Site - Toe Left, Lateral Metatarsal head fifth N/A Wound Location: Shear/Friction Blister N/A Wounding Event: Diabetic Wound/Ulcer of the Lower Diabetic Wound/Ulcer of the Lower N/A Primary Etiology: Extremity Extremity Hypertension, Peripheral Venous Hypertension, Peripheral Venous N/A Comorbid History: Disease, Type II Diabetes, Disease, Type II Diabetes, Osteomyelitis Osteomyelitis 11/18/2022  10/07/2022 N/A Date Acquired: 0 4 N/A Weeks of Treatment: Open Open N/A Wound Status: No No N/A Wound Recurrence: 2x0.2x0.3 1.2x1.3x0.6 N/A Measurements L x W x D (cm) 0.314 1.225 N/A A (cm) : rea 0.094 0.735 N/A Volume (cm) : N/A 71.60% N/A % Reduction in A rea: N/A 14.90% N/A % Reduction in Volume: Grade 1 Unable to visualize wound bed N/A Classification: Small Medium N/A Exudate A mount: Serous Serosanguineous N/A Exudate Type: amber red, brown N/A Exudate Color: N/A Flat and Intact N/A Wound Margin: N/A Small (1-33%) N/A Granulation A mount: N/A Pink N/A Granulation Quality: N/A Large (67-100%) N/A Necrotic A mount: N/A Eschar, Adherent Slough N/A Necrotic Tissue: None None N/A Epithelialization: Treatment Notes Electronic Signature(s) Signed: 11/18/2022 5:16:31 PM By: [name] Entered By: [name] on 11/18/2022 08:31:32 [name] ([id number]) [id number]  Page 5 of 10 -------------------------------------------------------------------------------- Multi-Disciplinary Care Plan Details Patient Name: Date of Service: [name], [name] 11/18/2022 8:15 A M Medical Record Number: [id number] Patient Account Number: [id number] Date of Birth/Sex: Treating RN: [birth year] (42 y.o. Loel Lofty, Selena Batten Primary Care Cainan Trull: [name] Other Clinician: Betha [name] Referring Anabell Swint: Treating Gwenette Wellons/Extender: [name] in Treatment: 7 Active Inactive Necrotic Tissue Nursing Diagnoses: Knowledge deficit related to management of necrotic/devitalized tissue Goals: Necrotic/devitalized tissue will be minimized in the wound bed Date Initiated: 09/30/2022 Target Resolution Date: 10/31/2022 Goal Status: Active Interventions: Assess patient pain level pre-, during and post procedure and prior to discharge Notes: Wound/Skin Impairment Nursing Diagnoses: Knowledge deficit related to  ulceration/compromised skin integrity Goals: Patient/caregiver will verbalize understanding of skin care regimen Date Initiated: 09/30/2022 Target Resolution Date: 10/31/2022 Goal Status: Active Ulcer/skin breakdown will have a volume reduction of 30% by week 4 Date Initiated: 09/30/2022 Target Resolution Date: 10/31/2022 Goal Status: Active Ulcer/skin breakdown will have a volume reduction of 50% by week 8 Date Initiated: 09/30/2022 Target Resolution Date: 11/30/2022 Goal Status: Active Ulcer/skin breakdown will have a volume reduction of 80% by week 12 Date Initiated: 09/30/2022 Target Resolution Date: 12/31/2022 Goal Status: Active Ulcer/skin breakdown will heal within 14 weeks Date Initiated: 09/30/2022 Target Resolution Date: 01/31/2023 Goal Status: Active Interventions: Assess patient/caregiver ability to obtain necessary supplies Assess patient/caregiver ability to perform ulcer/skin care regimen upon admission and as needed Assess ulceration(s) every visit Notes: Electronic Signature(s) Signed: 11/18/2022 5:16:31 PM By: [name] Signed: 11/19/2022 4:37:47 PM By: Elliot Gurney, BSN, RN, CWS, Kim RN, BSN Entered By: [name] on 11/18/2022 08:59:23 [name] ([id number]) [id number] Page 6 of 10 -------------------------------------------------------------------------------- Pain Assessment Details Patient Name: Date of Service: [name], [name] 11/18/2022 8:15 A M Medical Record Number: [id number] Patient Account Number: [id number] Date of Birth/Sex: Treating RN: [birth year] (42 y.o. [name] Primary Care Avy Barlett: [name] Other Clinician: Betha [name] Referring Pamla Pangle: Treating Christionna Poland/Extender: [name] in Treatment: 7 Active Problems Location of Pain Severity and Description of Pain Patient Has Paino No Site Locations Pain Management and Medication Current Pain Management: Electronic  Signature(s) Signed: 11/18/2022 5:16:31 PM By: [name] Signed: 11/19/2022 4:37:47 PM By: Elliot Gurney, BSN, RN, CWS, Kim RN, BSN Entered By: [name] on 11/18/2022 08:19:47 -------------------------------------------------------------------------------- Patient/Caregiver Education Details Patient Name: Date of Service: [name] 6/26/2024andnbsp8:15 A M Medical Record Number: [id number] Patient Account Number: [id number] Date of Birth/Gender: Treating RN: 05-Feb-1981 (42 y.o. [name] Primary Care Physician: [name] Other Clinician: Betha [name] Referring Physician: Treating Physician/Extender: [name] in Treatment: [address] ([id number]) [id number] Page 7 of 10 Education Assessment Education Provided To: Patient Education Topics Provided Wound/Skin Impairment: Handouts: Other: continue wound care as directed Methods: Explain/Verbal Responses: State content correctly Electronic Signature(s) Signed: 11/18/2022 5:16:31 PM By: [name] Entered By: [name] on 11/18/2022 08:59:41 -------------------------------------------------------------------------------- Wound Assessment Details Patient Name: Date of Service: [name], [name] 11/18/2022 8:15 A M Medical Record Number: [id number] Patient Account Number: [id number] Date of Birth/Sex: Treating RN: Nov 06, 1980 (42 y.o. 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 Wound Status: Open Wounding Event: Shear/Friction Comorbid Hypertension, Peripheral Venous Disease, Type II Diabetes, Date Acquired: 11/18/2022 History: Osteomyelitis Weeks  Of Treatment: 0 Clustered  Wound: No Photos Wound Measurements Length: (cm) 2 Width: (cm) 0.2 Depth: (cm) 0.3 Area: (cm) 0.314 Volume: (cm) 0.094 % Reduction in Area: % Reduction in Volume: Epithelialization: None Wound Description Classification: Grade 1 Exudate Amount: Medium Exudate Type: Serous [name], [name] ([id number]) Exudate Color: amber Foul Odor After Cleansing: No Slough/Fibrino No [id number] Page 8 of 10 Wound Bed Exposed Structure Fascia Exposed: No Fat Layer (Subcutaneous Tissue) Exposed: Yes Tendon Exposed: No Muscle Exposed: No Joint Exposed: No Bone Exposed: No Treatment Notes Wound #10 (Amputation Site - Toe) Wound Laterality: Left Cleanser Byram Ancillary Kit - 15 Day Supply Discharge Instruction: Use supplies as instructed; Kit contains: (15) Saline Bullets; (15) 3x3 Gauze; 15 pr Gloves Soap and Water Discharge Instruction: Gently cleanse wound with antibacterial soap, rinse and pat dry prior to dressing wounds Peri-Wound Care Topical Primary Dressing Hydrofera [name] Ready Transfer Foam, 4x5 (in/in) Discharge Instruction: Apply Hydrofera [name] Ready to wound bed as directed Secondary Dressing Kerlix 4.5 x 4.1 (in/yd) Discharge Instruction: Apply Kerlix 4.5 x 4.1 (in/yd) as instructed Secured With Medipore T - 48M Medipore H Soft Cloth Surgical T ape ape, 2x2 (in/yd) Compression Wrap Compression Stockings Add-Ons Electronic Signature(s) Signed: 11/18/2022 5:16:31 PM By: [name] Signed: 11/19/2022 4:37:47 PM By: Elliot Gurney, BSN, RN, CWS, Kim RN, BSN Entered By: [name] on 11/18/2022 09:02:46 -------------------------------------------------------------------------------- Wound Assessment Details Patient Name: Date of Service: [name] 11/18/2022 8:15 A M Medical Record Number: [id number] Patient Account Number: [id number] Date of Birth/Sex: Treating RN: 05-20-1981 (42 y.o. [name] Primary Care Lovetta Condie: [name] Other Clinician: Betha [name] Referring Starlyn Droge: Treating Janiah Devinney/Extender: [name] in Treatment: 7 Wound Status Wound Number: 9 Primary Diabetic Wound/Ulcer of the Lower Extremity Etiology: Wound Location: Left, Lateral Metatarsal head fifth Wound Status: Open Wounding Event: Blister Comorbid Hypertension, Peripheral Venous Disease, Type II Diabetes, Date Acquired: 10/07/2022 History: Osteomyelitis Weeks Of Treatment: 4 [name], [name] ([id number]) [id number] Page 9 of 10 Clustered Wound: No Photos Wound Measurements Length: (cm) 1.2 Width: (cm) 1.3 Depth: (cm) 0.6 Area: (cm) 1.225 Volume: (cm) 0.735 % Reduction in Area: 71.6% % Reduction in Volume: 14.9% Epithelialization: None Wound Description Classification: Unable to visualize wound bed Wound Margin: Flat and Intact Exudate Amount: Medium Exudate Type: Serosanguineous Exudate Color: red, brown Foul Odor After Cleansing: No Slough/Fibrino Yes Wound Bed Granulation Amount: Small (1-33%) Exposed Structure Granulation Quality: Pink Fascia Exposed: No Necrotic Amount: Large (67-100%) Fat Layer (Subcutaneous Tissue) Exposed: Yes Necrotic Quality: Eschar, Adherent Slough Tendon Exposed: No Muscle Exposed: No Joint Exposed: No Bone Exposed: No Treatment Notes Wound #9 (Metatarsal head fifth) Wound Laterality: Left, Lateral Cleanser Soap and Water Discharge Instruction: Gently cleanse wound with antibacterial soap, rinse and pat dry prior to dressing wounds Peri-Wound Care Topical Primary Dressing Gauze Discharge Instruction: moisten with Vashe and apply to wound Secondary Dressing (BORDER) Zetuvit Plus SILICONE BORDER Dressing 5x5 (in/in) Discharge Instruction: Please do not put silicone bordered dressings under wraps. Use non-bordered dressing only. Secured With Compression Wrap Compression  Stockings Facilities manager) Signed: 11/18/2022 5:16:31 PM By: [name] Signed: 11/19/2022 4:37:47 PM By: Elliot Gurney, BSN, RN, CWS, Kim RN, BSN Entered By: [name] on 11/18/2022 08:30:32 [name] ([id number]) [id number] Page 10 of 10 -------------------------------------------------------------------------------- Vitals Details Patient Name: Date of Service: [name], [name] 11/18/2022 8:15 A M Medical Record Number: [id number] Patient Account Number: [id number] Date of Birth/Sex: Treating RN: [birth year] (42 y.o. [name] Primary Care Maisa Bedingfield: [name] Other Clinician: Glynda Jaeger,  Angie Referring Donnice Nielsen: Treating Lexus Barletta/Extender: [name] in Treatment: 7 Vital Signs Time Taken: 08:17 Temperature (F): 97.9 Height (in): 73 Pulse (bpm): 88 Weight (lbs): 330 Respiratory Rate (breaths/min): 18 Body Mass Index (BMI): 43.5 Blood Pressure (mmHg): 114/80 Reference Range: 80 - 120 mg / dl Electronic Signature(s) Signed: 11/18/2022 5:16:31 PM By: [name] Entered By: [name] on 11/18/2022 08:19:43

## 2022-11-19 NOTE — Progress Notes (Signed)
[name], [name] ([id number]) [id number] Page 1 of 9 Visit Report for 11/18/2022 Chief Complaint Document Details Patient Name: Date of Service: [name], [name] 11/18/2022 8:15 A M Medical Record Number: [id number] Patient Account Number: [id number] Date of Birth/Sex: Treating RN: [birth year] (42 y.o. [name] Primary Care Provider: [name] Other Clinician: Betha Loa Referring Provider: Treating Provider/Extender: [name] in Treatment: 7 Information Obtained from: Patient Chief Complaint 05/13/2022; bilateral feet wounds 09/30/2022; patient returns to clinic with wounds on his left plantar foot Electronic Signature(s) Signed: 11/18/2022 4:25:37 PM By: Geralyn Corwin DO Entered By: Geralyn Corwin on 11/18/2022 09:11:23 -------------------------------------------------------------------------------- Debridement Details Patient Name: Date of Service: [name] 11/18/2022 8:15 A M Medical Record Number: [id number] Patient Account Number: [id number] Date of Birth/Sex: Treating RN: 1981/02/15 (42 y.o. [name] Primary Care Provider: [name] Other Clinician: Betha Loa Referring Provider: Treating Provider/Extender: [name] in Treatment: 7 Debridement Performed for Assessment: Wound #9 Left,Lateral Metatarsal head fifth Performed By: Physician Geralyn Corwin, MD Debridement Type: Debridement Severity of Tissue Pre Debridement: Fat layer exposed Level of Consciousness (Pre-procedure): Awake and Alert Pre-procedure Verification/Time Out Yes - 08:48 Taken: Start Time: 08:48 Percent of Wound Bed Debrided: 100% T Area Debrided (cm): otal 1.22 Tissue and other material debrided: Viable, Non-Viable, Slough, Subcutaneous, Slough Level: Skin/Subcutaneous Tissue Debridement Description: Excisional Instrument: Blade, Forceps Bleeding:  Minimum Hemostasis Achieved: Pressure Response to Treatment: Procedure was tolerated well Level of Consciousness (Post- Awake and Alert [name], [name] [id number] Page 2 of 9 Awake and Alert procedure): Post Debridement Measurements of Total Wound Length: (cm) 1.2 Width: (cm) 1.3 Depth: (cm) 0.6 Volume: (cm) 0.735 Character of Wound/Ulcer Post Debridement: Stable Severity of Tissue Post Debridement: Fat layer exposed Post Procedure Diagnosis Same as Pre-procedure Electronic Signature(s) Signed: 11/18/2022 4:25:37 PM By: Geralyn Corwin DO Signed: 11/18/2022 5:16:31 PM By: Betha Loa Signed: 11/19/2022 4:37:47 PM By: Elliot Gurney, BSN, RN, CWS, Kim RN, BSN Entered By: Betha Loa on 11/18/2022 08:52:15 -------------------------------------------------------------------------------- Debridement Details Patient Name: Date of Service: [name] 11/18/2022 8:15 A M Medical Record Number: [id number] Patient Account Number: [id number] Date of Birth/Sex: Treating RN: [birth year] (42 y.o. [name], [name] Primary Care Provider: [name] Other Clinician: Betha Loa Referring Provider: Treating Provider/Extender: [name] in Treatment: 7 Debridement Performed for Assessment: Wound #10 Left Amputation Site - Toe Performed By: Physician Geralyn Corwin, MD Debridement Type: Debridement Severity of Tissue Pre Debridement: Fat layer exposed Level of Consciousness (Pre-procedure): Awake and Alert Pre-procedure Verification/Time Out Yes - 08:53 Taken: Start Time: 08:53 Percent of Wound Bed Debrided: 100% T Area Debrided (cm): otal 0.31 Tissue and other material debrided: Viable, Non-Viable, Callus, Slough, Skin: Dermis , Skin: Epidermis, Slough Level: Skin/Epidermis Debridement Description: Selective/Open Wound Instrument: Curette Bleeding: Minimum Hemostasis Achieved: Pressure Response to  Treatment: Procedure was tolerated well Level of Consciousness (Post- Awake and Alert procedure): Post Debridement Measurements of Total Wound Length: (cm) 2 Width: (cm) 0.2 Depth: (cm) 0.3 Volume: (cm) 0.094 Character of Wound/Ulcer Post Debridement: Stable Severity of Tissue Post Debridement: Fat layer exposed Post Procedure Diagnosis Same as Pre-procedure Electronic Signature(s) Signed: 11/18/2022 4:25:37 PM By: Marylu Lund ([id number]) [id number] Page 3 of 9 Signed: 11/18/2022 5:16:31 PM By: Betha Loa Signed: 11/19/2022 4:37:47 PM By: Elliot Gurney, BSN, RN, CWS, Kim RN, BSN Entered By: Betha Loa on 11/18/2022 08:56:51 -------------------------------------------------------------------------------- HPI Details Patient Name: Date of Service: [name] 11/18/2022 8:15 A M Medical  Record Number: [id number] Patient Account Number: [id number] Date of Birth/Sex: Treating RN: [birth year] (42 y.o. [name] Primary Care Provider: [name] Other Clinician: Betha Loa Referring Provider: Treating Provider/Extender: [name] in Treatment: 7 History of Present Illness HPI Description: 05/13/2022 [name] is a 42 year old male with uncontrolled insulin-dependent type 2 diabetes with last hemoglobin A1c of 11.1, previous feet wounds that led to bilateral great toe amputations, ADHD and bipolar depression that presents the clinic for a 1 month history of non healing ulcers to the Feet bilaterally. His previous bilateral great toe amputation sites have healed. He has been using an antibacterial spray to keep the areas clean. He has not been dressing the wounds. He states that over the past couple days he has had increased redness and warmth to the right foot. He has regular tennis shoes. He does not use any offloading device. He states he walks around in socks and does  not go barefoot. 05/20/2022. This is a 42 year old man with type 2 diabetes and neuropathy. He has wounds on his right first metatarsal head and the base of his right fourth and fifth toes. He has been using Medihoney. Apparently there was a plan to have him casted today. He has had previously amputated first toes bilaterally a year to a year and a half ago by Dr. Lajoyce Corners for underlying osteomyelitis according to the patient. The patient has not had any foot wear. Because his forefoot is so wide he simply ordered his shoes online. Last week because of redness and warmth on the left foot he was put on Augmentin and doxycycline READMISSION 09/30/2022 This is a 42 year old man who is a type II diabetic with diabetic peripheral neuropathy. He was here for the clinic in the clinic in late September 2023 for 2 visits only and on the left foot had very similar wounds to currently. He tells [name] he left the clinic related to a problem with his underlying bipolar disorder. In any case he still has an area on the left first metatarsal head and at the base of the fourth and fifth toes. This is very similar by my memory to what was present in December 2023. He tells [name] that last week or 2 this became somewhat red and warm. He used doxycycline and amoxicillin for 5 days that he had leftover from a previous visit and that helped. At this point I am not sure about his diabetes control. He had peg assist shoes last time he was here he has not been wearing these Past medical history includes type 2 diabetes with peripheral neuropathy, and bilateral great toe amputations, ADHD, bipolar disorder, ABIs in our clinic were 0.78 on the right he does not have wounds here and 1.03 on the left which is his wounded side 5/15; patient presents for follow-up. He has been using Hydrofera Blue to the wound beds. The lateral left foot wound has healed. He has 2 remaining wounds. He has no issues or complaints today. 5/29; patient  presents for follow-up. He has been using Hydrofera Blue to the wound beds. Unfortunately the lateral left foot wound has reopened. He denies signs of infection. 6/5 the patient had wounds on his first and fourth metatarsal heads which have healed. Unfortunately he comes in with erythema around the area on the left fifth metatarsal head. 6/12; patient presents for follow-up. He was prescribed doxycycline at last clinic visit due to increased erythema to the left fifth met head. According to the  patient he did not pick this up. A culture was also done that grew Streptococcus sensitive to amoxicillin. This was sent in however the patient did not pick this up either. Wound has declined in appearance and there is increased erythema to the periwound however this is not streaking or progressing compared to last clinic pictures. He knows to go to the ED if he were to have worsening symptoms including increased pain, erythema, warmth or purulent drainage 6/19; patient presents for follow-up. He has been taking Augmentin and doxycycline. His symptoms have improved with decreased erythema and swelling to the left foot/periwound. He has been using Hydrofera Blue. He denies signs of infection. 6/26; patient presents for follow-up. He has finished Augmentin and doxycycline. He has been using Hydrofera Blue to the wound bed. He has no issues or complaints. He does have excessive callus to the first met head with now a crack and open wound. Electronic Signature(s) Signed: 11/18/2022 4:25:37 PM By: Geralyn Corwin DO Entered By: Geralyn Corwin on 11/18/2022 09:12:40 [name] ([id number]) [id number] Page 4 of 9 -------------------------------------------------------------------------------- Physical Exam Details Patient Name: Date of Service: [name], [name] 11/18/2022 8:15 A M Medical Record Number: [id number] Patient Account Number: [id number] Date of Birth/Sex:  Treating RN: [birth year] (42 y.o. [name] Primary Care Provider: [name] Other Clinician: Betha Loa Referring Provider: Treating Provider/Extender: [name] in Treatment: 7 Constitutional . Cardiovascular . Psychiatric . Notes T the left lateral aspect of the left fifth met head there is an open wound with nonviable tissue and granulation tissue. Probes close to bone however bone is o covered at this time. No signs of surrounding infection including increased warmth, erythema or purulent drainage. T the first met head there is significant o callus with a cracked and open wound. Postdebridement there is healthy granulation tissue present. Electronic Signature(s) Signed: 11/18/2022 4:25:37 PM By: Geralyn Corwin DO Entered By: Geralyn Corwin on 11/18/2022 09:13:21 -------------------------------------------------------------------------------- Physician Orders Details Patient Name: Date of Service: [name] 11/18/2022 8:15 A M Medical Record Number: [id number] Patient Account Number: [id number] Date of Birth/Sex: Treating RN: [birth year] (42 y.o. [name] Primary Care Provider: [name] Other Clinician: Betha Loa Referring Provider: Treating Provider/Extender: [name] in Treatment: 7 Verbal / Phone Orders: Yes Clinician: Huel Coventry Read Back and Verified: Yes Diagnosis Coding Follow-up Appointments Return Appointment in 1 week. Bathing/ [employer] wounds with antibacterial soap and water. Edema Control - Lymphedema / Segmental Compressive Device / Other Elevate, Exercise Daily and Avoid Standing for Long Periods of Time. KARTHIK, [place] ([id number]) [id number] Page 5 of 9 Elevate legs to the level of the heart and pump ankles as often as possible Elevate leg(s) parallel to the floor when sitting. Off-Loading Open toe  surgical shoe - front offloader Turn and reposition every 2 hours Medications-Please add to medication list. ntibiotics - continue antibiotics as directed P.O. A Wound Treatment Wound #10 - Amputation Site - Toe Wound Laterality: Left Cleanser: Byram Ancillary Kit - 15 Day Supply (DME) (Generic) 1 x Per Day/30 Days Discharge Instructions: Use supplies as instructed; Kit contains: (15) Saline Bullets; (15) 3x3 Gauze; 15 pr Gloves Cleanser: Soap and Water 1 x Per Day/30 Days Discharge Instructions: Gently cleanse wound with antibacterial soap, rinse and pat dry prior to dressing wounds Prim Dressing: Hydrofera Blue Ready Transfer Foam, 4x5 (in/in) (DME) (Dispense As Written) 1 x Per Day/30 Days ary Discharge Instructions: Apply Hydrofera Blue Ready to wound bed  as directed Secondary Dressing: Kerlix 4.5 x 4.1 (in/yd) (DME) (Generic) 1 x Per Day/30 Days Discharge Instructions: Apply Kerlix 4.5 x 4.1 (in/yd) as instructed Secured With: Medipore T - 51M Medipore H Soft Cloth Surgical T ape ape, 2x2 (in/yd) (DME) (Generic) 1 x Per Day/30 Days Wound #9 - Metatarsal head fifth Wound Laterality: Left, Lateral Cleanser: Soap and Water 1 x Per Day/30 Days Discharge Instructions: Gently cleanse wound with antibacterial soap, rinse and pat dry prior to dressing wounds Prim Dressing: Gauze (DME) (Generic) 1 x Per Day/30 Days ary Discharge Instructions: moisten with Vashe and apply to wound Secondary Dressing: (BORDER) Zetuvit Plus SILICONE BORDER Dressing 5x5 (in/in) (DME) (Dispense As Written) 1 x Per Day/30 Days Discharge Instructions: Please do not put silicone bordered dressings under wraps. Use non-bordered dressing only. Patient Medications llergies: milk A Notifications Medication Indication Start End 11/18/2022 doxycycline hyclate DOSE 1 - oral 100 mg tablet - 1 tablet oral twice a day x 14 days 11/18/2022 amoxicillin-pot clavulanate DOSE 1 - oral 875 mg-125 mg tablet - 1 tablet oral twice  a day x 14 days Electronic Signature(s) Signed: 11/18/2022 4:25:37 PM By: Geralyn Corwin DO Previous Signature: 11/18/2022 9:17:48 AM Version By: Geralyn Corwin DO Entered By: Geralyn Corwin on 11/18/2022 09:17:58 -------------------------------------------------------------------------------- Problem List Details Patient Name: Date of Service: [name] 11/18/2022 8:15 A M Medical Record Number: [id number] Patient Account Number: [id number] Date of Birth/Sex: Treating RN: 05-Oct-1980 (42 y.o. [name] Primary Care Provider: [name] Other Clinician: Betha Loa Referring Provider: Treating Provider/Extender: [name] in Treatment: 7 [name], [name] [id number] Page 6 of 9 Active Problems ICD-10 Encounter Code Description Active Date MDM Diagnosis E11.621 Type 2 diabetes mellitus with foot ulcer 09/30/2022 No Yes L97.528 Non-pressure chronic ulcer of other part of left foot with other specified 09/30/2022 No Yes severity E11.40 Type 2 diabetes mellitus with diabetic neuropathy, unspecified 09/30/2022 No Yes Inactive Problems Resolved Problems Electronic Signature(s) Signed: 11/18/2022 4:25:37 PM By: Geralyn Corwin DO Entered By: Geralyn Corwin on 11/18/2022 09:09:09 -------------------------------------------------------------------------------- Progress Note Details Patient Name: Date of Service: [name] 11/18/2022 8:15 A M Medical Record Number: [id number] Patient Account Number: [id number] Date of Birth/Sex: Treating RN: [birth year] (42 y.o. [name], [name] Primary Care Provider: [name] Other Clinician: Betha Loa Referring Provider: Treating Provider/Extender: [name] in Treatment: 7 Subjective Chief Complaint Information obtained from Patient 05/13/2022; bilateral feet wounds 09/30/2022; patient returns to clinic  with wounds on his left plantar foot History of Present Illness (HPI) 05/13/2022 [name] is a 42 year old male with uncontrolled insulin-dependent type 2 diabetes with last hemoglobin A1c of 11.1, previous feet wounds that led to bilateral great toe amputations, ADHD and bipolar depression that presents the clinic for a 1 month history of non healing ulcers to the Feet bilaterally. His previous bilateral great toe amputation sites have healed. He has been using an antibacterial spray to keep the areas clean. He has not been dressing the wounds. He states that over the past couple days he has had increased redness and warmth to the right foot. He has regular tennis shoes. He does not use any offloading device. He states he walks around in socks and does not go barefoot. 05/20/2022. This is a 42 year old man with type 2 diabetes and neuropathy. He has wounds on his right first metatarsal head and the base of his right fourth and fifth toes. He has been using Medihoney. Apparently there was a  plan to have him casted today. He has had previously amputated first toes bilaterally a year to a year and a half ago by Dr. Lajoyce Corners for underlying osteomyelitis according to the patient. The patient has not had any foot wear. Because his forefoot is so wide he simply ordered his shoes online. Last week because of redness and warmth on the left foot he was put on Augmentin and doxycycline READMISSION 09/30/2022 This is a 42 year old man who is a type II diabetic with diabetic peripheral neuropathy. He was here for the clinic in the clinic in late September 2023 for 2 visits only and on the left foot had very similar wounds to currently. He tells [name] he left the clinic related to a problem with his underlying bipolar disorder. In any case he still has an area on the left first metatarsal head and at the base of the fourth and fifth toes. This is very similar by my memory to what was present in December  2023. He tells [name] that last week or 2 this became somewhat red and warm. He used doxycycline and amoxicillin for 5 days that he had leftover [name], GING ([id number]) [id number] Page 7 of 9 from a previous visit and that helped. At this point I am not sure about his diabetes control. He had peg assist shoes last time he was here he has not been wearing these Past medical history includes type 2 diabetes with peripheral neuropathy, and bilateral great toe amputations, ADHD, bipolar disorder, ABIs in our clinic were 0.78 on the right he does not have wounds here and 1.03 on the left which is his wounded side 5/15; patient presents for follow-up. He has been using Hydrofera Blue to the wound beds. The lateral left foot wound has healed. He has 2 remaining wounds. He has no issues or complaints today. 5/29; patient presents for follow-up. He has been using Hydrofera Blue to the wound beds. Unfortunately the lateral left foot wound has reopened. He denies signs of infection. 6/5 the patient had wounds on his first and fourth metatarsal heads which have healed. Unfortunately he comes in with erythema around the area on the left fifth metatarsal head. 6/12; patient presents for follow-up. He was prescribed doxycycline at last clinic visit due to increased erythema to the left fifth met head. According to the patient he did not pick this up. A culture was also done that grew Streptococcus sensitive to amoxicillin. This was sent in however the patient did not pick this up either. Wound has declined in appearance and there is increased erythema to the periwound however this is not streaking or progressing compared to last clinic pictures. He knows to go to the ED if he were to have worsening symptoms including increased pain, erythema, warmth or purulent drainage 6/19; patient presents for follow-up. He has been taking Augmentin and doxycycline. His symptoms have improved  with decreased erythema and swelling to the left foot/periwound. He has been using Hydrofera Blue. He denies signs of infection. 6/26; patient presents for follow-up. He has finished Augmentin and doxycycline. He has been using Hydrofera Blue to the wound bed. He has no issues or complaints. He does have excessive callus to the first met head with now a crack and open wound. Objective Constitutional Vitals Time Taken: 8:17 AM, Height: 73 in, Weight: 330 lbs, BMI: 43.5, Temperature: 97.9 F, Pulse: 88 bpm, Respiratory Rate: 18 breaths/min, Blood Pressure: 114/80 mmHg. General Notes: T the left lateral aspect of the left fifth  met head there is an open wound with nonviable tissue and granulation tissue. Probes close to bone o however bone is covered at this time. No signs of surrounding infection including increased warmth, erythema or purulent drainage. T the first met head there o is significant callus with a cracked and open wound. Postdebridement there is healthy granulation tissue present. Integumentary (Hair, Skin) Wound #10 status is Open. Original cause of wound was Shear/Friction. The date acquired was: 11/18/2022. The wound is located on the Left Amputation Site - T The wound measures 2cm length x 0.2cm width x 0.3cm depth; 0.314cm^2 area and 0.094cm^3 volume. There is Fat Layer (Subcutaneous Tissue) oe. exposed. There is a medium amount of serous drainage noted. Wound #9 status is Open. Original cause of wound was Blister. The date acquired was: 10/07/2022. The wound has been in treatment 4 weeks. The wound is located on the Left,Lateral Metatarsal head fifth. The wound measures 1.2cm length x 1.3cm width x 0.6cm depth; 1.225cm^2 area and 0.735cm^3 volume. There is Fat Layer (Subcutaneous Tissue) exposed. There is a medium amount of serosanguineous drainage noted. The wound margin is flat and intact. There is small (1-33%) pink granulation within the wound bed. There is a large (67-100%)  amount of necrotic tissue within the wound bed including Eschar and Adherent Slough. Assessment Active Problems ICD-10 Type 2 diabetes mellitus with foot ulcer Non-pressure chronic ulcer of other part of left foot with other specified severity Type 2 diabetes mellitus with diabetic neuropathy, unspecified Patient's left lateral foot wound has improved in appearance however is still fairly deep. I debrided nonviable tissue. I recommended continue antibiotics as he is high risk for infection and amputation. Recommended switching the dressing to Vashe wet-to-dry to this area. He has now opened a wound to the first met head and I debrided nonviable tissue here and recommended Hydrofera Blue. Also tissue to a front offloading shoe to see if this will help both wounds. Follow-up in 1 week. Procedures Wound #10 Pre-procedure diagnosis of Wound #10 is a Diabetic Wound/Ulcer of the Lower Extremity located on the Left Amputation Site - T .Severity of Tissue Pre oe Debridement is: Fat layer exposed. There was a Selective/Open Wound Skin/Epidermis Debridement with a total area of 0.31 sq cm performed by [name], [name] ([id number]) [id number] Page 8 of 9 Shanda Bumps, MD. With the following instrument(s): Curette to remove Viable and Non-Viable tissue/material. Material removed includes Callus, Slough, Skin: Dermis, and Skin: Epidermis. A time out was conducted at 08:53, prior to the start of the procedure. A Minimum amount of bleeding was controlled with Pressure. The procedure was tolerated well. Post Debridement Measurements: 2cm length x 0.2cm width x 0.3cm depth; 0.094cm^3 volume. Character of Wound/Ulcer Post Debridement is stable. Severity of Tissue Post Debridement is: Fat layer exposed. Post procedure Diagnosis Wound #10: Same as Pre-Procedure Wound #9 Pre-procedure diagnosis of Wound #9 is a Diabetic Wound/Ulcer of the Lower Extremity located on the  Left,Lateral Metatarsal head fifth .Severity of Tissue Pre Debridement is: Fat layer exposed. There was a Excisional Skin/Subcutaneous Tissue Debridement with a total area of 1.22 sq cm performed by Geralyn Corwin, MD. With the following instrument(s): Blade, and Forceps to remove Viable and Non-Viable tissue/material. Material removed includes Subcutaneous Tissue and Slough and. A time out was conducted at 08:48, prior to the start of the procedure. A Minimum amount of bleeding was controlled with Pressure. The procedure was tolerated well. Post Debridement Measurements: 1.2cm length x 1.3cm width x 0.6cm depth; 0.735cm^3  volume. Character of Wound/Ulcer Post Debridement is stable. Severity of Tissue Post Debridement is: Fat layer exposed. Post procedure Diagnosis Wound #9: Same as Pre-Procedure Plan Follow-up Appointments: Return Appointment in 1 week. Bathing/ Shower/ Hygiene: Wash wounds with antibacterial soap and water. Edema Control - Lymphedema / Segmental Compressive Device / Other: Elevate, Exercise Daily and Avoid Standing for Long Periods of Time. Elevate legs to the level of the heart and pump ankles as often as possible Elevate leg(s) parallel to the floor when sitting. Off-Loading: Open toe surgical shoe - front offloader Turn and reposition every 2 hours Medications-Please add to medication list.: P.O. Antibiotics - continue antibiotics as directed WOUND #10: - Amputation Site - T oe Wound Laterality: Left Cleanser: Byram Ancillary Kit - 15 Day Supply (DME) (Generic) 1 x Per Day/30 Days Discharge Instructions: Use supplies as instructed; Kit contains: (15) Saline Bullets; (15) 3x3 Gauze; 15 pr Gloves Cleanser: Soap and Water 1 x Per Day/30 Days Discharge Instructions: Gently cleanse wound with antibacterial soap, rinse and pat dry prior to dressing wounds Prim Dressing: Hydrofera Blue Ready Transfer Foam, 4x5 (in/in) (DME) (Dispense As Written) 1 x Per Day/30  Days ary Discharge Instructions: Apply Hydrofera Blue Ready to wound bed as directed Secondary Dressing: Kerlix 4.5 x 4.1 (in/yd) (DME) (Generic) 1 x Per Day/30 Days Discharge Instructions: Apply Kerlix 4.5 x 4.1 (in/yd) as instructed Secured With: Medipore T - 23M Medipore H Soft Cloth Surgical T ape ape, 2x2 (in/yd) (DME) (Generic) 1 x Per Day/30 Days WOUND #9: - Metatarsal head fifth Wound Laterality: Left, Lateral Cleanser: Soap and Water 1 x Per Day/30 Days Discharge Instructions: Gently cleanse wound with antibacterial soap, rinse and pat dry prior to dressing wounds Prim Dressing: Gauze (DME) (Generic) 1 x Per Day/30 Days ary Discharge Instructions: moisten with Vashe and apply to wound Secondary Dressing: (BORDER) Zetuvit Plus SILICONE BORDER Dressing 5x5 (in/in) (DME) (Dispense As Written) 1 x Per Day/30 Days Discharge Instructions: Please do not put silicone bordered dressings under wraps. Use non-bordered dressing only. 1. In office sharp debridement 2. Vashe wet-to-dry dressings 3. Hydrofera Blue 4. Front offloading shoe 5. Follow-up in 1 week 6. Continue oral antibiotics Electronic Signature(s) Signed: 11/18/2022 4:25:37 PM By: Geralyn Corwin DO Entered By: Geralyn Corwin on 11/18/2022 09:15:02 -------------------------------------------------------------------------------- SuperBill Details Patient Name: Date of Service: [name], [name] 11/18/2022 [name] ([id number]) [id number] Page 9 of 9 Medical Record Number: [id number] Patient Account Number: [id number] Date of Birth/Sex: Treating RN: [birth year] (42 y.o. [name], [name] Primary Care Provider: [name] Other Clinician: Betha Loa Referring Provider: Treating Provider/Extender: [name] in Treatment: 7 Diagnosis Coding ICD-10 Codes Code Description [id number] Type 2 diabetes mellitus with foot ulcer L97.528 Non-pressure  chronic ulcer of other part of left foot with other specified severity E11.40 Type 2 diabetes mellitus with diabetic neuropathy, unspecified Facility Procedures : CPT4 Code: [id number] Description: 11042 - DEB SUBQ TISSUE 20 SQ CM/< ICD-10 Diagnosis Description 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ri E11.621 Type 2 diabetes mellitus with foot ulcer Modifier: ty Quantity: 1 : CPT4 Code: 29528413 Description: 97597 - DEBRIDE WOUND 1ST 20 SQ CM OR < ICD-10 Diagnosis Description 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ri E11.621 Type 2 diabetes mellitus with foot ulcer Modifier: ty Quantity: 1 Physician Procedures : CPT4 Code Description Modifier [id number] 11042 - WC PHYS SUBQ TISS 20 SQ CM ICD-10 Diagnosis Description L97.528 Non-pressure chronic  ulcer of other part of left foot with other specified severity E11.621 Type 2 diabetes mellitus with foot ulcer Quantity: 1 : [id number] 97597 - WC PHYS DEBR WO ANESTH 20 SQ CM ICD-10 Diagnosis Description 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rity E11.621 Type 2 diabetes mellitus with foot ulcer Quantity: 1 Electronic Signature(s) Signed: 11/18/2022 4:25:37 PM By: Geralyn Corwin DO Entered By: Geralyn Corwin on 11/18/2022 09:15:31

## 2022-11-20 DIAGNOSIS — Z89412 Acquired absence of left great toe: Secondary | ICD-10-CM | POA: Diagnosis not present

## 2022-11-20 DIAGNOSIS — L97512 Non-pressure chronic ulcer of other part of right foot with fat layer exposed: Secondary | ICD-10-CM | POA: Diagnosis not present

## 2022-11-20 DIAGNOSIS — L97522 Non-pressure chronic ulcer of other part of left foot with fat layer exposed: Secondary | ICD-10-CM | POA: Diagnosis not present

## 2022-11-20 DIAGNOSIS — E11621 Type 2 diabetes mellitus with foot ulcer: Secondary | ICD-10-CM | POA: Diagnosis not present

## 2022-11-20 DIAGNOSIS — I872 Venous insufficiency (chronic) (peripheral): Secondary | ICD-10-CM | POA: Diagnosis not present

## 2022-11-25 ENCOUNTER — Encounter: Payer: 59 | Attending: Internal Medicine | Admitting: Internal Medicine

## 2022-11-25 DIAGNOSIS — Z794 Long term (current) use of insulin: Secondary | ICD-10-CM | POA: Diagnosis not present

## 2022-11-25 DIAGNOSIS — I10 Essential (primary) hypertension: Secondary | ICD-10-CM | POA: Insufficient documentation

## 2022-11-25 DIAGNOSIS — Z792 Long term (current) use of antibiotics: Secondary | ICD-10-CM | POA: Diagnosis not present

## 2022-11-25 DIAGNOSIS — F319 Bipolar disorder, unspecified: Secondary | ICD-10-CM | POA: Insufficient documentation

## 2022-11-25 DIAGNOSIS — E1142 Type 2 diabetes mellitus with diabetic polyneuropathy: Secondary | ICD-10-CM | POA: Insufficient documentation

## 2022-11-25 DIAGNOSIS — L97528 Non-pressure chronic ulcer of other part of left foot with other specified severity: Secondary | ICD-10-CM | POA: Insufficient documentation

## 2022-11-25 DIAGNOSIS — E11621 Type 2 diabetes mellitus with foot ulcer: Secondary | ICD-10-CM | POA: Diagnosis not present

## 2022-11-25 DIAGNOSIS — Z7985 Long-term (current) use of injectable non-insulin antidiabetic drugs: Secondary | ICD-10-CM | POA: Diagnosis not present

## 2022-11-25 DIAGNOSIS — F909 Attention-deficit hyperactivity disorder, unspecified type: Secondary | ICD-10-CM | POA: Insufficient documentation

## 2022-11-25 DIAGNOSIS — Z7984 Long term (current) use of oral hypoglycemic drugs: Secondary | ICD-10-CM | POA: Diagnosis not present

## 2022-11-25 DIAGNOSIS — Z89412 Acquired absence of left great toe: Secondary | ICD-10-CM | POA: Insufficient documentation

## 2022-11-25 DIAGNOSIS — Z89411 Acquired absence of right great toe: Secondary | ICD-10-CM | POA: Insufficient documentation

## 2022-12-02 ENCOUNTER — Encounter (HOSPITAL_BASED_OUTPATIENT_CLINIC_OR_DEPARTMENT_OTHER): Payer: 59 | Admitting: Internal Medicine

## 2022-12-02 ENCOUNTER — Other Ambulatory Visit (HOSPITAL_COMMUNITY): Payer: Self-pay

## 2022-12-02 ENCOUNTER — Ambulatory Visit
Admission: RE | Admit: 2022-12-02 | Discharge: 2022-12-02 | Disposition: A | Discharge status: Home / Self Care | Payer: 59 | Source: Ambulatory Visit | Attending: Internal Medicine | Admitting: Internal Medicine

## 2022-12-02 ENCOUNTER — Other Ambulatory Visit: Payer: Self-pay | Admitting: Internal Medicine

## 2022-12-02 DIAGNOSIS — M869 Osteomyelitis, unspecified: Secondary | ICD-10-CM | POA: Diagnosis not present

## 2022-12-02 DIAGNOSIS — S81802A Unspecified open wound, left lower leg, initial encounter: Secondary | ICD-10-CM | POA: Insufficient documentation

## 2022-12-02 DIAGNOSIS — E1142 Type 2 diabetes mellitus with diabetic polyneuropathy: Secondary | ICD-10-CM | POA: Diagnosis not present

## 2022-12-02 DIAGNOSIS — E1140 Type 2 diabetes mellitus with diabetic neuropathy, unspecified: Secondary | ICD-10-CM | POA: Diagnosis not present

## 2022-12-02 DIAGNOSIS — S91102A Unspecified open wound of left great toe without damage to nail, initial encounter: Secondary | ICD-10-CM | POA: Diagnosis not present

## 2022-12-02 DIAGNOSIS — F909 Attention-deficit hyperactivity disorder, unspecified type: Secondary | ICD-10-CM | POA: Diagnosis not present

## 2022-12-02 DIAGNOSIS — Z794 Long term (current) use of insulin: Secondary | ICD-10-CM | POA: Diagnosis not present

## 2022-12-02 DIAGNOSIS — L97528 Non-pressure chronic ulcer of other part of left foot with other specified severity: Secondary | ICD-10-CM | POA: Diagnosis not present

## 2022-12-02 DIAGNOSIS — E11621 Type 2 diabetes mellitus with foot ulcer: Secondary | ICD-10-CM | POA: Diagnosis not present

## 2022-12-02 DIAGNOSIS — M009 Pyogenic arthritis, unspecified: Secondary | ICD-10-CM | POA: Diagnosis not present

## 2022-12-02 DIAGNOSIS — I10 Essential (primary) hypertension: Secondary | ICD-10-CM | POA: Diagnosis not present

## 2022-12-02 DIAGNOSIS — Z792 Long term (current) use of antibiotics: Secondary | ICD-10-CM | POA: Diagnosis not present

## 2022-12-02 DIAGNOSIS — F319 Bipolar disorder, unspecified: Secondary | ICD-10-CM | POA: Diagnosis not present

## 2022-12-02 DIAGNOSIS — Z89412 Acquired absence of left great toe: Secondary | ICD-10-CM | POA: Diagnosis not present

## 2022-12-02 DIAGNOSIS — L97529 Non-pressure chronic ulcer of other part of left foot with unspecified severity: Secondary | ICD-10-CM | POA: Diagnosis not present

## 2022-12-02 DIAGNOSIS — Z89411 Acquired absence of right great toe: Secondary | ICD-10-CM | POA: Diagnosis not present

## 2022-12-02 MED ORDER — AMOXICILLIN-POT CLAVULANATE 875-125 MG PO TABS
1.0000 | ORAL_TABLET | Freq: Two times a day (BID) | ORAL | 0 refills | Status: DC
  Filled 2022-12-02: qty 28, 14d supply, fill #0

## 2022-12-02 MED ORDER — DOXYCYCLINE HYCLATE 100 MG PO TABS
100.0000 mg | ORAL_TABLET | Freq: Two times a day (BID) | ORAL | 0 refills | Status: DC
  Filled 2022-12-02: qty 28, 14d supply, fill #0

## 2022-12-08 ENCOUNTER — Other Ambulatory Visit (HOSPITAL_COMMUNITY): Payer: Self-pay | Admitting: Psychiatry

## 2022-12-08 ENCOUNTER — Other Ambulatory Visit (HOSPITAL_COMMUNITY): Payer: Self-pay

## 2022-12-08 ENCOUNTER — Other Ambulatory Visit: Payer: Self-pay | Admitting: Internal Medicine

## 2022-12-08 ENCOUNTER — Encounter (HOSPITAL_COMMUNITY): Payer: Self-pay

## 2022-12-08 DIAGNOSIS — F900 Attention-deficit hyperactivity disorder, predominantly inattentive type: Secondary | ICD-10-CM

## 2022-12-08 DIAGNOSIS — E1169 Type 2 diabetes mellitus with other specified complication: Secondary | ICD-10-CM

## 2022-12-08 DIAGNOSIS — F321 Major depressive disorder, single episode, moderate: Secondary | ICD-10-CM

## 2022-12-09 ENCOUNTER — Encounter (HOSPITAL_BASED_OUTPATIENT_CLINIC_OR_DEPARTMENT_OTHER): Payer: 59 | Admitting: Internal Medicine

## 2022-12-09 ENCOUNTER — Other Ambulatory Visit (HOSPITAL_COMMUNITY): Payer: Self-pay

## 2022-12-09 ENCOUNTER — Other Ambulatory Visit: Payer: Self-pay | Admitting: Internal Medicine

## 2022-12-09 DIAGNOSIS — E11621 Type 2 diabetes mellitus with foot ulcer: Secondary | ICD-10-CM

## 2022-12-09 DIAGNOSIS — L97528 Non-pressure chronic ulcer of other part of left foot with other specified severity: Secondary | ICD-10-CM | POA: Diagnosis not present

## 2022-12-09 DIAGNOSIS — E1142 Type 2 diabetes mellitus with diabetic polyneuropathy: Secondary | ICD-10-CM | POA: Diagnosis not present

## 2022-12-09 DIAGNOSIS — F319 Bipolar disorder, unspecified: Secondary | ICD-10-CM | POA: Diagnosis not present

## 2022-12-09 DIAGNOSIS — Z792 Long term (current) use of antibiotics: Secondary | ICD-10-CM | POA: Diagnosis not present

## 2022-12-09 DIAGNOSIS — Z89411 Acquired absence of right great toe: Secondary | ICD-10-CM | POA: Diagnosis not present

## 2022-12-09 DIAGNOSIS — E1140 Type 2 diabetes mellitus with diabetic neuropathy, unspecified: Secondary | ICD-10-CM

## 2022-12-09 DIAGNOSIS — Z794 Long term (current) use of insulin: Secondary | ICD-10-CM | POA: Diagnosis not present

## 2022-12-09 DIAGNOSIS — F909 Attention-deficit hyperactivity disorder, unspecified type: Secondary | ICD-10-CM | POA: Diagnosis not present

## 2022-12-09 DIAGNOSIS — I10 Essential (primary) hypertension: Secondary | ICD-10-CM | POA: Diagnosis not present

## 2022-12-09 DIAGNOSIS — J439 Emphysema, unspecified: Secondary | ICD-10-CM

## 2022-12-09 DIAGNOSIS — Z89412 Acquired absence of left great toe: Secondary | ICD-10-CM | POA: Diagnosis not present

## 2022-12-09 NOTE — Progress Notes (Signed)
MIKHAIL, HALLENBECK ([id number]) [id number] Page 1 of 10 Visit Report for 12/09/2022 Arrival Information Details Patient Name: Date of Service: [name], [name] 12/09/2022 8:15 A M Medical Record Number: [id number] Patient Account Number: [id number] Date of Birth/Sex: Treating RN: 1980-11-04 (42 y.o. [name] Primary Care Abijah Roussel: [name] Other Clinician: Referring Clairessa Boulet: Treating Wiley Magan/Extender: [name] in Treatment: 10 Visit Information History Since Last Visit Added or deleted any medications: No Patient Arrived: Ambulatory Any new allergies or adverse reactions: No Arrival Time: 08:03 Had a fall or experienced change in No Accompanied By: self activities of daily living that may affect Transfer Assistance: None risk of falls: Patient Identification Verified: Yes Hospitalized since last visit: No Secondary Verification Process Completed: Yes Has Dressing in Place as Prescribed: Yes Patient Requires Transmission-Based Precautions: No Has Footwear/Offloading in Place as Yes Patient Has Alerts: Yes Prescribed: Patient Alerts: Left ABI 1.03 05/13/22 Right: Surgical Shoe with Pressure Relief Insole Right ABI .78 05/13/22 Pain Present Now: No Electronic Signature(s) Signed: 12/09/2022 4:35:37 PM By: Angelina Pih Entered By: Angelina Pih on 12/09/2022 08:21:44 -------------------------------------------------------------------------------- Clinic Level of Care Assessment Details Patient Name: Date of Service: [name], [name] 12/09/2022 8:15 A M Medical Record Number: [id number] Patient Account Number: [id number] Date of Birth/Sex: Treating RN: 18-Nov-1980 (42 y.o. [name] Primary Care Daniell Mancinas: [name] Other Clinician: Referring Markasia Carrol: Treating Littleton Haub/Extender: [name] in Treatment: 10 Clinic Level of Care Assessment  Items TOOL 1 Quantity Score []  - 0 Use when EandM and Procedure is performed on INITIAL visit ASSESSMENTS - Nursing Assessment / Reassessment []  - 0 General Physical Exam (combine w/ comprehensive assessment (listed just below) when performed on new pt. evals) []  - 0 Comprehensive Assessment (HX, ROS, Risk Assessments, Wounds Hx, etc.) ASSESSMENTS - Wound and Skin Assessment / Reassessment TREYSEAN, PETRUZZI ([id number]) [id number] Page 2 of 10 []  - 0 Dermatologic / Skin Assessment (not related to wound area) ASSESSMENTS - Ostomy and/or Continence Assessment and Care []  - 0 Incontinence Assessment and Management []  - 0 Ostomy Care Assessment and Management (repouching, etc.) PROCESS - Coordination of Care []  - 0 Simple Patient / Family Education for ongoing care []  - 0 Complex (extensive) Patient / Family Education for ongoing care []  - 0 Staff obtains Chiropractor, Records, T Results / Process Orders est []  - 0 Staff telephones HHA, Nursing Homes / Clarify orders / etc []  - 0 Routine Transfer to another Facility (non-emergent condition) []  - 0 Routine Hospital Admission (non-emergent condition) []  - 0 New Admissions / Manufacturing engineer / Ordering NPWT Apligraf, etc. , []  - 0 Emergency Hospital Admission (emergent condition) PROCESS - Special Needs []  - 0 Pediatric / Minor Patient Management []  - 0 Isolation Patient Management []  - 0 Hearing / Language / Visual special needs []  - 0 Assessment of Community assistance (transportation, D/[name] planning, etc.) []  - 0 Additional assistance / Altered mentation []  - 0 Support Surface(s) Assessment (bed, cushion, seat, etc.) INTERVENTIONS - Miscellaneous []  - 0 External ear exam []  - 0 Patient Transfer (multiple staff / Nurse, adult / Similar devices) []  - 0 Simple Staple / Suture removal (25 or less) []  - 0 Complex Staple / Suture removal (26 or more) []  - 0 Hypo/Hyperglycemic Management  (do not check if billed separately) []  - 0 Ankle / Brachial Index (ABI) - do not check if billed separately Has the patient been seen at the hospital within the last three years: Yes Total Score: 0  Level Of Care: ____ Electronic Signature(s) Signed: 12/09/2022 4:35:37 PM By: Angelina Pih Entered By: Angelina Pih on 12/09/2022 09:57:31 -------------------------------------------------------------------------------- Encounter Discharge Information Details Patient Name: Date of Service: [name] 12/09/2022 8:15 A M Medical Record Number: [id number] Patient Account Number: [id number] Date of Birth/Sex: Treating RN: 1980-12-04 (42 y.o. [name] Primary Care Rhonda Linan: [name] Other Clinician: Referring Nella Botsford: Treating Anavictoria Wilk/Extender: [name] in Treatment: [address] ([id number]) [id number] Page 3 of 10 Encounter Discharge Information Items Post Procedure Vitals Discharge Condition: Stable Temperature (F): 97.6 Ambulatory Status: Ambulatory Pulse (bpm): 101 Discharge Destination: Home Respiratory Rate (breaths/min): 18 Transportation: Private Auto Blood Pressure (mmHg): 131/84 Accompanied By: self Schedule Follow-up Appointment: Yes Clinical Summary of Care: Electronic Signature(s) Signed: 12/09/2022 9:59:31 AM By: Angelina Pih Entered By: Angelina Pih on 12/09/2022 09:59:30 -------------------------------------------------------------------------------- Lower Extremity Assessment Details Patient Name: Date of Service: [name], [name] 12/09/2022 8:15 A M Medical Record Number: [id number] Patient Account Number: [id number] Date of Birth/Sex: Treating RN: Oct 19, 1980 (42 y.o. [name] Primary Care Edith Lord: [name] Other Clinician: Referring Tadeo Besecker: Treating Bradon Fester/Extender: [name] in Treatment: 10 Edema  Assessment Assessed: Kyra Searles: No] [Right: No] Edema: [Left: N] [Right: o] Vascular Assessment Pulses: Dorsalis Pedis Palpable: [Left:Yes] [Right:Yes] Posterior Tibial Palpable: [Left:Yes] [Right:Yes] Electronic Signature(s) Signed: 12/09/2022 4:35:37 PM By: Angelina Pih Entered By: Angelina Pih on 12/09/2022 08:30:47 -------------------------------------------------------------------------------- Multi Wound Chart Details Patient Name: Date of Service: [name] 12/09/2022 8:15 A M Medical Record Number: [id number] Patient Account Number: [id number] Date of Birth/Sex: Treating RN: [birth year] (42 y.o. [name] Primary Care Jhaniya Briski: [name] Other Clinician: Darliss [name] ([id number]) [id number] Page 4 of 10 Referring Alphonzo Devera: Treating Cleotha Tsang/Extender: [name] in Treatment: 10 Vital Signs Height(in): 73 Pulse(bpm): 101 Weight(lbs): 330 Blood Pressure(mmHg): 131/84 Body Mass Index(BMI): 43.5 Temperature(F): 97.6 Respiratory Rate(breaths/min): 18 [10:Photos:] [N/A:N/A] Left Amputation Site - Toe Left, Lateral Metatarsal head fifth N/A Wound Location: Shear/Friction Blister N/A Wounding Event: Diabetic Wound/Ulcer of the Lower Diabetic Wound/Ulcer of the Lower N/A Primary Etiology: Extremity Extremity Hypertension, Peripheral Venous Hypertension, Peripheral Venous N/A Comorbid History: Disease, Type II Diabetes, Disease, Type II Diabetes, Osteomyelitis Osteomyelitis 11/18/2022 10/07/2022 N/A Date Acquired: 3 7 N/A Weeks of Treatment: Open Open N/A Wound Status: No No N/A Wound Recurrence: 0.3x0.3x0.3 1.1x1.2x0.5 N/A Measurements L x W x D (cm) 0.071 1.037 N/A A (cm) : rea 0.021 0.518 N/A Volume (cm) : 77.40% 76.00% N/A % Reduction in A rea: 77.70% 40.00% N/A % Reduction in Volume: Grade 1 Grade 2 N/A Classification: Medium Medium N/A Exudate A  mount: Serosanguineous Serosanguineous N/A Exudate Type: red, brown red, brown N/A Exudate Color: N/A Flat and Intact N/A Wound Margin: Small (1-33%) Medium (34-66%) N/A Granulation A mount: Pink Pink N/A Granulation Quality: Large (67-100%) Medium (34-66%) N/A Necrotic A mount: Fat Layer (Subcutaneous Tissue): Yes Fat Layer (Subcutaneous Tissue): Yes N/A Exposed Structures: Fascia: No Tendon: Yes Tendon: No Fascia: No Muscle: No Muscle: No Joint: No Joint: No Bone: No Bone: No None None N/A Epithelialization: Treatment Notes Electronic Signature(s) Signed: 12/09/2022 4:35:37 PM By: Angelina Pih Entered By: Angelina Pih on 12/09/2022 08:52:42 -------------------------------------------------------------------------------- Multi-Disciplinary Care Plan Details Patient Name: Date of Service: [name] 12/09/2022 8:15 A M Medical Record Number: [id number] Patient Account Number: [id number] [name], [name] ([id number]) [id number] Page 5 of 10 Date of Birth/Sex: Treating RN: [birth year] (42 y.o. [name] Primary Care Alexsa Flaum: [name] Other Clinician: Referring Sharetha Newson:  Treating Huma Imhoff/Extender: [name] in Treatment: 10 Active Inactive Necrotic Tissue Nursing Diagnoses: Knowledge deficit related to management of necrotic/devitalized tissue Goals: Necrotic/devitalized tissue will be minimized in the wound bed Date Initiated: 09/30/2022 Target Resolution Date: 12/16/2022 Goal Status: Active Interventions: Assess patient pain level pre-, during and post procedure and prior to discharge Notes: Wound/Skin Impairment Nursing Diagnoses: Knowledge deficit related to ulceration/compromised skin integrity Goals: Patient/caregiver will verbalize understanding of skin care regimen Date Initiated: 09/30/2022 Date Inactivated: 12/02/2022 Target Resolution Date: 10/31/2022 Goal Status:  Met Ulcer/skin breakdown will have a volume reduction of 30% by week 4 Date Initiated: 09/30/2022 Date Inactivated: 12/02/2022 Target Resolution Date: 10/31/2022 Goal Status: Unmet Unmet Reason: continue care Ulcer/skin breakdown will have a volume reduction of 50% by week 8 Date Initiated: 09/30/2022 Date Inactivated: 12/09/2022 Target Resolution Date: 11/30/2022 Goal Status: Met Ulcer/skin breakdown will have a volume reduction of 80% by week 12 Date Initiated: 09/30/2022 Target Resolution Date: 12/31/2022 Goal Status: Active Ulcer/skin breakdown will heal within 14 weeks Date Initiated: 09/30/2022 Target Resolution Date: 01/31/2023 Goal Status: Active Interventions: Assess patient/caregiver ability to obtain necessary supplies Assess patient/caregiver ability to perform ulcer/skin care regimen upon admission and as needed Assess ulceration(s) every visit Notes: Electronic Signature(s) Signed: 12/09/2022 9:58:14 AM By: Angelina Pih Entered By: Angelina Pih on 12/09/2022 09:58:14 -------------------------------------------------------------------------------- Pain Assessment Details Patient Name: Date of Service: [name], [name] 12/09/2022 8:15 A M Medical Record Number: [id number] Patient Account Number: [id number] Date of Birth/Sex: Treating RN: 08/01/1980 (42 y.o. Maan, Zarcone, Brandt [name] ([id number]) [id number] Page 6 of 10 Primary Care Jaccob Czaplicki: [name] Other Clinician: Referring Dandria Griego: Treating Tarica Harl/Extender: [name] in Treatment: 10 Active Problems Location of Pain Severity and Description of Pain Patient Has Paino No Site Locations Rate the pain. Current Pain Level: 0 Pain Management and Medication Current Pain Management: Electronic Signature(s) Signed: 12/09/2022 4:35:37 PM By: Angelina Pih Entered By: Angelina Pih on 12/09/2022  08:22:05 -------------------------------------------------------------------------------- Patient/Caregiver Education Details Patient Name: Date of Service: [name] 7/17/2024andnbsp8:15 A M Medical Record Number: [id number] Patient Account Number: [id number] Date of Birth/Gender: Treating RN: [birth year] (42 y.o. [name] Primary Care Physician: [name] Other Clinician: Referring Physician: Treating Physician/Extender: [name] in Treatment: 10 Education Assessment Education Provided To: Patient Education Topics Provided Wound Debridement: Handouts: Wound Debridement Methods: Explain/Verbal Responses: State content correctly Wound/Skin Impairment: Handouts: Caring for Your Ulcer Methods: Explain/Verbal Responses: State content correctly [name], [name] [id number] Page 7 of 10 Electronic Signature(s) Signed: 12/09/2022 4:35:37 PM By: Angelina Pih Entered By: Angelina Pih on 12/09/2022 09:58:37 -------------------------------------------------------------------------------- Wound Assessment Details Patient Name: Date of Service: [name], [name] 12/09/2022 8:15 A M Medical Record Number: [id number] Patient Account Number: [id number] Date of Birth/Sex: Treating RN: [birth year] (42 y.o. [name] Primary Care Bruk Tumolo: [name] Other Clinician: Referring Vonzella Althaus: Treating Ashle Stief/Extender: [name] in Treatment: 10 Wound Status Wound Number: 10 Primary Diabetic Wound/Ulcer of the Lower Extremity Etiology: Wound Location: Left Amputation Site - Toe Wound Status: Open Wounding Event: Shear/Friction Comorbid Hypertension, Peripheral Venous Disease, Type II Diabetes, Date Acquired: 11/18/2022 History: Osteomyelitis Weeks Of Treatment: 3 Clustered Wound: No Photos Wound Measurements Length: (cm) 0.3 Width: (cm)  0.3 Depth: (cm) 0.3 Area: (cm) 0.071 Volume: (cm) 0.021 % Reduction in Area: 77.4% % Reduction in Volume: 77.7% Epithelialization: None Tunneling: No Undermining: No Wound Description Classification: Grade 1 Exudate Amount: Medium Exudate Type: Serous Exudate Color: amber Foul Odor  After Cleansing: No Slough/Fibrino Yes Wound Bed Granulation Amount: Medium (34-66%) Exposed Structure Granulation Quality: Red, Pink Fascia Exposed: No Necrotic Amount: Medium (34-66%) Fat Layer (Subcutaneous Tissue) Exposed: Yes Necrotic Quality: Adherent Slough Tendon Exposed: No Muscle Exposed: No Joint Exposed: No Bone Exposed: No [name], [name] ([id number]) [id number] Page 8 of 10 Treatment Notes Wound #10 (Amputation Site - Toe) Wound Laterality: Left Cleanser Soap and Water Discharge Instruction: Gently cleanse wound with antibacterial soap, rinse and pat dry prior to dressing wounds Peri-Wound Care Topical Primary Dressing Hydrofera Blue Ready Transfer Foam, 4x5 (in/in) Discharge Instruction: Apply Hydrofera Blue Ready to wound bed as directed Secondary Dressing Kerlix 4.5 x 4.1 (in/yd) Discharge Instruction: Apply Kerlix 4.5 x 4.1 (in/yd) as instructed Secured With Medipore T - 9M Medipore H Soft Cloth Surgical T ape ape, 2x2 (in/yd) Compression Wrap Compression Stockings Add-Ons Electronic Signature(s) Signed: 12/09/2022 4:35:37 PM By: Angelina Pih Previous Signature: 12/09/2022 9:51:30 AM Version By: Angelina Pih Entered By: Angelina Pih on 12/09/2022 09:52:36 -------------------------------------------------------------------------------- Wound Assessment Details Patient Name: Date of Service: [name], [name] 12/09/2022 8:15 A M Medical Record Number: [id number] Patient Account Number: [id number] Date of Birth/Sex: Treating RN: 12-16-1980 (42 y.o. [name] Primary Care Keen Ewalt: [name] Other  Clinician: Referring Jadien Lehigh: Treating [name]/Extender: [name] in Treatment: 10 Wound Status Wound Number: 9 Primary Diabetic Wound/Ulcer of the Lower Extremity Etiology: Wound Location: Left, Lateral Metatarsal head fifth Wound Status: Open Wounding Event: Blister Comorbid Hypertension, Peripheral Venous Disease, Type II Diabetes, Date Acquired: 10/07/2022 History: Osteomyelitis Weeks Of Treatment: 7 Clustered Wound: No Photos TALOR, CHEEMA ([id number]) [id number] Page 9 of 10 Wound Measurements Length: (cm) 1.1 Width: (cm) 1.2 Depth: (cm) 0.5 Area: (cm) 1.037 Volume: (cm) 0.518 % Reduction in Area: 76% % Reduction in Volume: 40% Epithelialization: None Tunneling: No Undermining: No Wound Description Classification: Grade 3 Wound Margin: Flat and Intact Exudate Amount: Medium Exudate Type: Serosanguineous Exudate Color: red, brown Foul Odor After Cleansing: No Slough/Fibrino Yes Wound Bed Granulation Amount: Medium (34-66%) Exposed Structure Granulation Quality: Pink Fascia Exposed: No Necrotic Amount: Medium (34-66%) Fat Layer (Subcutaneous Tissue) Exposed: Yes Necrotic Quality: Adherent Slough Tendon Exposed: Yes Muscle Exposed: No Joint Exposed: No Bone Exposed: No Treatment Notes Wound #9 (Metatarsal head fifth) Wound Laterality: Left, Lateral Cleanser Soap and Water Discharge Instruction: Gently cleanse wound with antibacterial soap, rinse and pat dry prior to dressing wounds Peri-Wound Care Topical Primary Dressing Gauze Discharge Instruction: moisten with Vashe and apply to wound Secondary Dressing (BORDER) Zetuvit Plus SILICONE BORDER Dressing 5x5 (in/in) Discharge Instruction: Please do not put silicone bordered dressings under wraps. Use non-bordered dressing only. Secured With Compression Wrap Compression Stockings Add-Ons Electronic Signature(s) Signed: 12/09/2022 9:50:17  AM By: Angelina Pih Entered By: Angelina Pih on 12/09/2022 09:50:17 [name] ([id number]) [id number] Page 10 of 10 -------------------------------------------------------------------------------- Vitals Details Patient Name: Date of Service: TANNAR, BROKER 12/09/2022 8:15 A M Medical Record Number: [id number] Patient Account Number: [id number] Date of Birth/Sex: Treating RN: 21-Jun-1980 (42 y.o. [name] Primary Care Kaimana Neuzil: [name] Other Clinician: Referring Zakariah Urwin: Treating Carly Sabo/Extender: [name] in Treatment: 10 Vital Signs Time Taken: 08:20 Temperature (F): 97.6 Height (in): 73 Pulse (bpm): 101 Weight (lbs): 330 Respiratory Rate (breaths/min): 18 Body Mass Index (BMI): 43.5 Blood Pressure (mmHg): 131/84 Reference Range: 80 - 120 mg / dl Electronic Signature(s) Signed: 12/09/2022 4:35:37 PM By: Angelina Pih Entered By: Angelina Pih on 12/09/2022 08:21:58

## 2022-12-09 NOTE — Progress Notes (Signed)
GAL, FELDHAUS ([id number]) [id number] Page 1 of 1 Visit Report for 12/09/2022 HBO Patient Questionnaire Details Patient Name: Date of Service: [name], [name] 12/09/2022 8:15 A M Medical Record Number: [id number] Patient Account Number: [id number] Date of Birth/Sex: Treating RN: 07/25/1980 (42 y.o. [name] Primary Care [name]: [name] Other Clinician: Referring Shterna Laramee: Treating Albertus Chiarelli/Extender: [name] in Treatment: 10 HBO Patient Questionnaire Items Answer A "Yes" answers must be brought to the hyperbaric physician's attention. ny Breathing or Lung problemso No Currently use tobacco productso No Used tobacco products in the pasto No Heart problemso No Do you take water pills (diuretic)o No Diabeteso Yes On Diabetes pillo Yes On Insulino Yes Dialysiso No Eye problems like glaucomao No Ear problems or surgeryo No Sinus Problemso No Cancero No Confinement Anxiety (Claustrophobia- fear of confined places)o No Any medical implants/devices that are fully or partially implanted or attached to your bodyo No Pregnanto No Seizureso No Date of last: CBC: 04/28/2022 Notes Patient has orders in for chest x-ray and EKG, spoke with patient regarding this. Electronic Signature(s) Signed: 12/09/2022 1:50:04 PM By: Angelina Pih Entered By: Angelina Pih on 12/09/2022 13:50:04

## 2022-12-10 ENCOUNTER — Ambulatory Visit
Admission: RE | Admit: 2022-12-10 | Discharge: 2022-12-10 | Disposition: A | Discharge status: Home / Self Care | Payer: 59 | Source: Ambulatory Visit | Attending: Internal Medicine | Admitting: Internal Medicine

## 2022-12-10 DIAGNOSIS — J439 Emphysema, unspecified: Secondary | ICD-10-CM | POA: Insufficient documentation

## 2022-12-10 DIAGNOSIS — Z01810 Encounter for preprocedural cardiovascular examination: Secondary | ICD-10-CM | POA: Diagnosis not present

## 2022-12-10 DIAGNOSIS — Z0389 Encounter for observation for other suspected diseases and conditions ruled out: Secondary | ICD-10-CM | POA: Diagnosis not present

## 2022-12-11 ENCOUNTER — Other Ambulatory Visit: Payer: Self-pay

## 2022-12-11 ENCOUNTER — Encounter (HOSPITAL_COMMUNITY): Payer: Self-pay

## 2022-12-11 ENCOUNTER — Other Ambulatory Visit (HOSPITAL_COMMUNITY): Payer: Self-pay

## 2022-12-11 ENCOUNTER — Other Ambulatory Visit: Payer: Self-pay | Admitting: Internal Medicine

## 2022-12-11 DIAGNOSIS — E1169 Type 2 diabetes mellitus with other specified complication: Secondary | ICD-10-CM

## 2022-12-11 MED ORDER — PRAVASTATIN SODIUM 40 MG PO TABS
40.0000 mg | ORAL_TABLET | Freq: Every day | ORAL | 0 refills | Status: DC
Start: 2022-12-11 — End: 2022-12-31
  Filled 2022-12-11: qty 30, 30d supply, fill #0

## 2022-12-15 NOTE — Progress Notes (Signed)
BHARAT, ANTILLON ([id number]) [id number] Page 1 of 8 Visit Report for 12/02/2022 Arrival Information Details Patient Name: Date of Service: [name], [name] 12/02/2022 8:15 A M Medical Record Number: [id number] Patient Account Number: [id number] Date of Birth/Sex: Treating RN: 09/13/1980 (42 y.o. [name] Primary Care Matsue Strom: [name] Other Clinician: Referring Ammon Muscatello: Treating Easter Schinke/Extender: [name] in Treatment: 9 Visit Information History Since Last Visit Added or deleted any medications: No Patient Arrived: Ambulatory Has Footwear/Offloading in Place as Prescribed: Yes Arrival Time: 08:19 Left: Wedge Shoe Transfer Assistance: None Pain Present Now: No Patient Identification Verified: Yes Secondary Verification Process Completed: Yes Patient Requires Transmission-Based Precautions: No Patient Has Alerts: Yes Patient Alerts: Left ABI 1.03 05/13/22 Right ABI .78 05/13/22 Electronic Signature(s) Signed: 12/15/2022 8:06:11 AM By: Elliot Gurney, BSN, RN, CWS, Kim RN, BSN Entered By: Elliot Gurney, BSN, RN, CWS, Kim on 12/02/2022 08:20:07 -------------------------------------------------------------------------------- Encounter Discharge Information Details Patient Name: Date of Service: [name] 12/02/2022 8:15 A M Medical Record Number: [id number] Patient Account Number: [id number] Date of Birth/Sex: Treating RN: 1981/03/29 (42 y.o. [name] Primary Care Keilan Nichol: [name] Other Clinician: Referring Cheyan Frees: Treating Jakalyn Kratky/Extender: [name] in Treatment: 9 Encounter Discharge Information Items Post Procedure Vitals Discharge Condition: Stable Unable to obtain vitals Reason: limited time Ambulatory Status: Ambulatory Discharge Destination: Home Transportation: Private Auto Schedule Follow-up Appointment: Yes Clinical Summary of Care: Electronic  Signature(s) Signed: 12/15/2022 8:06:11 AM By: Elliot Gurney, BSN, RN, CWS, Kim RN, BSN Entered By: Elliot Gurney, BSN, RN, CWS, Kim on 12/02/2022 09:30:27 [name] ([id number]) [id number] Page 2 of 8 -------------------------------------------------------------------------------- Lower Extremity Assessment Details Patient Name: Date of Service: [name], [name] 12/02/2022 8:15 A M Medical Record Number: [id number] Patient Account Number: [id number] Date of Birth/Sex: Treating RN: 05-04-1981 (42 y.o. [name] Primary Care Kalla Watson: [name] Other Clinician: Referring Demetrious Rainford: Treating Halley Shepheard/Extender: [name] in Treatment: 9 Edema Assessment Assessed: Kyra Searles: No] Franne Forts: No] Edema: [Left: N] [Right: o] Vascular Assessment Pulses: Dorsalis Pedis Palpable: [Left:Yes] Notes pulses easily palpable Electronic Signature(s) Signed: 12/15/2022 8:06:11 AM By: Elliot Gurney, BSN, RN, CWS, Kim RN, BSN Entered By: Elliot Gurney, BSN, RN, CWS, Kim on 12/02/2022 08:34:40 -------------------------------------------------------------------------------- Multi Wound Chart Details Patient Name: Date of Service: [name] 12/02/2022 8:15 A M Medical Record Number: [id number] Patient Account Number: [id number] Date of Birth/Sex: Treating RN: 03-29-1981 (41 y.o. [name] Primary Care Adrienne Trombetta: [name] Other Clinician: Referring Auria Mckinlay: Treating Truman Aceituno/Extender: [name] in Treatment: 9 Vital Signs Height(in): 73 Pulse(bpm): 84 Weight(lbs): 330 Blood Pressure(mmHg): 130/87 Body Mass Index(BMI): 43.5 Temperature(F): 97.7 Respiratory Rate(breaths/min): 16 [place]master, Victoria C ([id number]) [10:Photos:] [[id number] Page 3 of 8 N/A] Left Amputation Site - Toe Left, Lateral Metatarsal head fifth N/A Wound Location: Shear/Friction Blister N/A Wounding  Event: Diabetic Wound/Ulcer of the Lower Diabetic Wound/Ulcer of the Lower N/A Primary Etiology: Extremity Extremity Hypertension, Peripheral Venous Hypertension, Peripheral Venous N/A Comorbid History: Disease, Type II Diabetes, Disease, Type II Diabetes, Osteomyelitis Osteomyelitis 11/18/2022 10/07/2022 N/A Date Acquired: 2 6 N/A Weeks of Treatment: Open Open N/A Wound Status: No No N/A Wound Recurrence: 1x0.2x0.2 1.8x1.2x0.8 N/A Measurements L x W x D (cm) 0.157 1.696 N/A A (cm) : rea 0.031 1.357 N/A Volume (cm) : 50.00% 60.70% N/A % Reduction in A rea: 67.00% -57.10% N/A % Reduction in Volume: 5 12 Starting Position 1 (o'clock): 9 9 Ending Position 1 (o'clock): 0.5 1.2 Maximum Distance 1 (cm): Yes Yes N/A Undermining: Grade 1  Grade 2 N/A Classification: Medium Medium N/A Exudate A mount: Serous Serosanguineous N/A Exudate Type: amber red, brown N/A Exudate Color: N/A Flat and Intact N/A Wound Margin: None Present (0%) Small (1-33%) N/A Granulation A mount: N/A Pink N/A Granulation Quality: Small (1-33%) Large (67-100%) N/A Necrotic A mount: Fat Layer (Subcutaneous Tissue): Yes Fat Layer (Subcutaneous Tissue): Yes N/A Exposed Structures: Fascia: No Tendon: Yes Tendon: No Fascia: No Muscle: No Muscle: No Joint: No Joint: No Bone: No Bone: No None None N/A Epithelialization: callus surrounding wound N/A N/A Assessment Notes: Treatment Notes Electronic Signature(s) Signed: 12/15/2022 8:06:11 AM By: Elliot Gurney, BSN, RN, CWS, Kim RN, BSN Previous Signature: 12/02/2022 8:55:00 AM Version By: Elliot Gurney, BSN, RN, CWS, Kim RN, BSN Entered By: Elliot Gurney, BSN, RN, CWS, Kim on 12/02/2022 09:04:09 -------------------------------------------------------------------------------- Multi-Disciplinary Care Plan Details Patient Name: Date of Service: [name], [name] 12/02/2022 8:15 A M Medical Record Number: [id number] Patient Account Number: [id number] Date of  Birth/Sex: Treating RN: Mar 28, 1981 (42 y.o. [name] Primary Care Madelaine Whipple: [name] Other Clinician: Referring Farid Grigorian: Treating Avana Kreiser/Extender: [name] in Treatment: [address] [name], [name] ([id number]) [id number] Page 4 of 8 Necrotic Tissue Nursing Diagnoses: Knowledge deficit related to management of necrotic/devitalized tissue Goals: Necrotic/devitalized tissue will be minimized in the wound bed Date Initiated: 09/30/2022 Target Resolution Date: 12/16/2022 Goal Status: Active Interventions: Assess patient pain level pre-, during and post procedure and prior to discharge Notes: Wound/Skin Impairment Nursing Diagnoses: Knowledge deficit related to ulceration/compromised skin integrity Goals: Patient/caregiver will verbalize understanding of skin care regimen Date Initiated: 09/30/2022 Date Inactivated: 12/02/2022 Target Resolution Date: 10/31/2022 Goal Status: Met Ulcer/skin breakdown will have a volume reduction of 30% by week 4 Date Initiated: 09/30/2022 Date Inactivated: 12/02/2022 Target Resolution Date: 10/31/2022 Goal Status: Unmet Unmet Reason: continue care Ulcer/skin breakdown will have a volume reduction of 50% by week 8 Date Initiated: 09/30/2022 Target Resolution Date: 11/30/2022 Goal Status: Active Ulcer/skin breakdown will have a volume reduction of 80% by week 12 Date Initiated: 09/30/2022 Target Resolution Date: 12/31/2022 Goal Status: Active Ulcer/skin breakdown will heal within 14 weeks Date Initiated: 09/30/2022 Target Resolution Date: 01/31/2023 Goal Status: Active Interventions: Assess patient/caregiver ability to obtain necessary supplies Assess patient/caregiver ability to perform ulcer/skin care regimen upon admission and as needed Assess ulceration(s) every visit Notes: Electronic Signature(s) Signed: 12/02/2022 8:58:22 AM By: Elliot Gurney, BSN, RN, CWS, Kim RN, BSN Entered By:  Elliot Gurney, BSN, RN, CWS, Kim on 12/02/2022 08:58:21 -------------------------------------------------------------------------------- Pain Assessment Details Patient Name: Date of Service: [name] 12/02/2022 8:15 A M Medical Record Number: [id number] Patient Account Number: [id number] Date of Birth/Sex: Treating RN: 09/15/1980 (42 y.o. [name] Primary Care Demia Viera: [name] Other Clinician: Referring Khalilah Hoke: Treating Uday Jantz/Extender: [name] in Treatment: 9 Active Problems Location of Pain Severity and Description of Pain Patient Has Paino No MARQUIN, PATINO ([id number]) [id number] Page 5 of 8 Patient Has Paino No Site Locations Pain Management and Medication Current Pain Management: Notes Patient denies pain at this time. Electronic Signature(s) Signed: 12/15/2022 8:06:11 AM By: Elliot Gurney, BSN, RN, CWS, Kim RN, BSN Entered By: Elliot Gurney, BSN, RN, CWS, Kim on 12/02/2022 59:56:38 -------------------------------------------------------------------------------- Patient/Caregiver Education Details Patient Name: Date of Service: [name] 7/10/2024andnbsp8:15 A M Medical Record Number: [id number] Patient Account Number: [id number] Date of Birth/Gender: Treating RN: [birth year] (42 y.o. [name] Primary Care Physician: [name] Other Clinician: Referring Physician: Treating Physician/Extender: [name] in Treatment: 9 Education Assessment Education Provided  To: Patient Education Topics Provided Offloading: Handouts: How Offloading Helps Foot Wounds Heal, Other: keep pressure off of wound, offloading shoe at all times Methods: Explain/Verbal Responses: State content correctly Wound/Skin Impairment: Handouts: Caring for Your Ulcer, Other: wound care as prescribed Methods: Demonstration, Explain/Verbal Responses: State content  correctly Electronic Signature(s) Signed: 12/15/2022 8:06:11 AM By: Elliot Gurney, BSN, RN, CWS, Kim RN, BSN [place], [place] C ([id number]) [id number] Page 6 of 8 Entered By: Elliot Gurney, BSN, RN, CWS, Kim on 12/02/2022 09:10:46 -------------------------------------------------------------------------------- Wound Assessment Details Patient Name: Date of Service: [name], [name] 12/02/2022 8:15 A M Medical Record Number: [id number] Patient Account Number: [id number] Date of Birth/Sex: Treating RN: 1980/10/08 (42 y.o. Loel Lofty, Selena Batten Primary Care Jacoria Keiffer: [name] Other Clinician: Referring Eswin Worrell: Treating Edward Trevino/Extender: [name] in Treatment: 9 Wound Status Wound Number: 10 Primary Diabetic Wound/Ulcer of the Lower Extremity Etiology: Wound Location: Left Amputation Site - Toe Wound Status: Open Wounding Event: Shear/Friction Comorbid Hypertension, Peripheral Venous Disease, Type II Diabetes, Date Acquired: 11/18/2022 History: Osteomyelitis Weeks Of Treatment: 2 Clustered Wound: No Photos Wound Measurements Length: (cm) 1 Width: (cm) 0.2 Depth: (cm) 0.2 Area: (cm) 0.157 Volume: (cm) 0.031 % Reduction in Area: 50% % Reduction in Volume: 67% Epithelialization: None Undermining: Yes Starting Position (o'clock): 5 Ending Position (o'clock): 9 Maximum Distance: (cm) 0.5 Wound Description Classification: Grade 1 Exudate Amount: Medium Exudate Type: Serous Exudate Color: amber Foul Odor After Cleansing: No Slough/Fibrino Yes Wound Bed Granulation Amount: None Present (0%) Exposed Structure Necrotic Amount: Small (1-33%) Fascia Exposed: No Necrotic Quality: Adherent Slough Fat Layer (Subcutaneous Tissue) Exposed: Yes Tendon Exposed: No Muscle Exposed: No Joint Exposed: No Bone Exposed: No Assessment Notes callus surrounding wound KASEY, EWINGS C ([id number]) [id number] Page  7 of 8 Electronic Signature(s) Signed: 12/15/2022 8:06:11 AM By: Elliot Gurney, BSN, RN, CWS, Kim RN, BSN Entered By: Elliot Gurney, BSN, RN, CWS, Kim on 12/02/2022 08:31:03 -------------------------------------------------------------------------------- Wound Assessment Details Patient Name: Date of Service: [name], [name] 12/02/2022 8:15 A M Medical Record Number: [id number] Patient Account Number: [id number] Date of Birth/Sex: Treating RN: 04-Jan-1981 (42 y.o. Loel Lofty, Selena Batten Primary Care Vyncent Overby: [name] Other Clinician: Referring Wallie Lagrand: Treating Trisha Morandi/Extender: [name] in Treatment: 9 Wound Status Wound Number: 9 Primary Diabetic Wound/Ulcer of the Lower Extremity Etiology: Wound Location: Left, Lateral Metatarsal head fifth Wound Status: Open Wounding Event: Blister Comorbid Hypertension, Peripheral Venous Disease, Type II Diabetes, Date Acquired: 10/07/2022 History: Osteomyelitis Weeks Of Treatment: 6 Clustered Wound: No Photos Wound Measurements Length: (cm) 1.8 Width: (cm) 1.2 Depth: (cm) 0.8 Area: (cm) 1.696 Volume: (cm) 1.357 % Reduction in Area: 60.7% % Reduction in Volume: -57.1% Epithelialization: None Tunneling: No Undermining: Yes Starting Position (o'clock): 12 Ending Position (o'clock): 9 Maximum Distance: (cm) 1.2 Wound Description Classification: Grade 2 Wound Margin: Flat and Intact Exudate Amount: Medium Exudate Type: Serosanguineous Exudate Color: red, brown Foul Odor After Cleansing: No Slough/Fibrino Yes Wound Bed Granulation Amount: Small (1-33%) Exposed Structure Granulation Quality: Pink Fascia Exposed: No Necrotic Amount: Large (67-100%) Fat Layer (Subcutaneous Tissue) Exposed: Yes Necrotic Quality: Adherent Slough Tendon Exposed: Yes Muscle Exposed: No Joint Exposed: No [name], [name] C ([id number]) [id number] Page 8 of 8 Bone Exposed: No Electronic  Signature(s) Signed: 12/15/2022 8:06:11 AM By: Elliot Gurney, BSN, RN, CWS, Kim RN, BSN Entered By: Elliot Gurney, BSN, RN, CWS, Kim on 12/02/2022 08:33:53 -------------------------------------------------------------------------------- Vitals Details Patient Name: Date of Service: [name], [name] 12/02/2022 8:15 A M Medical Record Number: [id number] Patient Account Number: [id number] Date of Birth/Sex: Treating RN:  07/28/1980 (41 y.o. Loel Lofty, Selena Batten Primary Care Issaih Kaus: [name] Other Clinician: Referring Cyndra Feinberg: Treating Kolyn Rozario/Extender: [name] in Treatment: 9 Vital Signs Time Taken: 08:20 Temperature (F): 97.7 Height (in): 73 Pulse (bpm): 84 Weight (lbs): 330 Respiratory Rate (breaths/min): 16 Body Mass Index (BMI): 43.5 Blood Pressure (mmHg): 130/87 Reference Range: 80 - 120 mg / dl Electronic Signature(s) Signed: 12/15/2022 8:06:11 AM By: Elliot Gurney, BSN, RN, CWS, Kim RN, BSN Entered By: Elliot Gurney, BSN, RN, CWS, Kim on 12/02/2022 08:23:39

## 2022-12-15 NOTE — Progress Notes (Signed)
RANDE, ROYLANCE ([id number]) [id number] Page 1 of 9 Visit Report for 11/25/2022 Arrival Information Details Patient Name: Date of Service: [name], [name] 11/25/2022 8:15 A M Medical Record Number: [id number] Patient Account Number: [id number] Date of Birth/Sex: Treating RN: 10-21-1980 (42 y.o. [name] Primary Care Morrigan Wickens: [name] Other Clinician: Betha Loa Referring Decari Duggar: Treating Oluwaseun Cremer/Extender: [name] in Treatment: 8 Visit Information History Since Last Visit All ordered tests and consults were completed: No Patient Arrived: Ambulatory Added or deleted any medications: No Arrival Time: 08:20 Any new allergies or adverse reactions: No Transfer Assistance: None Had a fall or experienced change in No Patient Identification Verified: Yes activities of daily living that may affect Secondary Verification Process Completed: Yes risk of falls: Patient Requires Transmission-Based Precautions: No Signs or symptoms of abuse/neglect since last visito No Patient Has Alerts: Yes Hospitalized since last visit: No Patient Alerts: Left ABI 1.03 05/13/22 Implantable device outside of the clinic excluding No Right ABI .78 05/13/22 cellular tissue based products placed in the center since last visit: Has Dressing in Place as Prescribed: Yes Has Footwear/Offloading in Place as Prescribed: Yes Left: Wedge Shoe Pain Present Now: No Electronic Signature(s) Signed: 11/25/2022 4:57:17 PM By: Betha Loa Entered By: Betha Loa on 11/25/2022 08:22:30 -------------------------------------------------------------------------------- Clinic Level of Care Assessment Details Patient Name: Date of Service: [name], [name] 11/25/2022 8:15 A M Medical Record Number: [id number] Patient Account Number: [id number] Date of Birth/Sex: Treating RN: 08-26-1980 (42 y.o. [name] Primary Care Lexx Monte:  [name] Other Clinician: Betha Loa Referring Oziel Beitler: Treating Dixie Coppa/Extender: [name] in Treatment: 8 Clinic Level of Care Assessment Items TOOL 1 Quantity Score []  - 0 Use when EandM and Procedure is performed on INITIAL visit ASSESSMENTS - Nursing Assessment / Reassessment []  - 0 General Physical Exam (combine w/ comprehensive assessment (listed just below) when performed on new pt. [address]SAMIER, JACO ([id number]) [id number] Page 2 of 9 []  - 0 Comprehensive Assessment (HX, ROS, Risk Assessments, Wounds Hx, etc.) ASSESSMENTS - Wound and Skin Assessment / Reassessment []  - 0 Dermatologic / Skin Assessment (not related to wound area) ASSESSMENTS - Ostomy and/or Continence Assessment and Care []  - 0 Incontinence Assessment and Management []  - 0 Ostomy Care Assessment and Management (repouching, etc.) PROCESS - Coordination of Care []  - 0 Simple Patient / Family Education for ongoing care []  - 0 Complex (extensive) Patient / Family Education for ongoing care []  - 0 Staff obtains Chiropractor, Records, T Results / Process Orders est []  - 0 Staff telephones HHA, Nursing Homes / Clarify orders / etc []  - 0 Routine Transfer to another Facility (non-emergent condition) []  - 0 Routine Hospital Admission (non-emergent condition) []  - 0 New Admissions / Manufacturing engineer / Ordering NPWT Apligraf, etc. , []  - 0 Emergency Hospital Admission (emergent condition) PROCESS - Special Needs []  - 0 Pediatric / Minor Patient Management []  - 0 Isolation Patient Management []  - 0 Hearing / Language / Visual special needs []  - 0 Assessment of Community assistance (transportation, D/C planning, etc.) []  - 0 Additional assistance / Altered mentation []  - 0 Support Surface(s) Assessment (bed, cushion, seat, etc.) INTERVENTIONS - Miscellaneous []  - 0 External ear exam []  - 0 Patient Transfer  (multiple staff / Nurse, adult / Similar devices) []  - 0 Simple Staple / Suture removal (25 or less) []  - 0 Complex Staple / Suture removal (26 or more) []  - 0 Hypo/Hyperglycemic Management (do not check if billed  separately) []  - 0 Ankle / Brachial Index (ABI) - do not check if billed separately Has the patient been seen at the hospital within the last three years: Yes Total Score: 0 Level Of Care: ____ Electronic Signature(s) Signed: 11/25/2022 4:57:17 PM By: Betha Loa Entered By: Betha Loa on 11/25/2022 08:53:22 -------------------------------------------------------------------------------- Encounter Discharge Information Details Patient Name: Date of Service: [name] 11/25/2022 8:15 A M Medical Record Number: [id number] Patient Account Number: [id number] Date of Birth/Sex: Treating RN: 1980/06/09 (42 y.o. Uriel, Dowding, Sublette C ([id number]) [id number] Page 3 of 9 Primary Care Divine Imber: [name] Other Clinician: Betha Loa Referring Jackelin Correia: Treating Jennyfer Nickolson/Extender: [name] in Treatment: 8 Encounter Discharge Information Items Post Procedure Vitals Discharge Condition: Stable Temperature (F): 98.1 Ambulatory Status: Ambulatory Pulse (bpm): 90 Discharge Destination: Home Respiratory Rate (breaths/min): 18 Transportation: Private Auto Blood Pressure (mmHg): 125/84 Accompanied By: self Schedule Follow-up Appointment: Yes Clinical Summary of Care: Electronic Signature(s) Signed: 11/25/2022 4:57:17 PM By: Betha Loa Entered By: Betha Loa on 11/25/2022 09:03:25 -------------------------------------------------------------------------------- Lower Extremity Assessment Details Patient Name: Date of Service: [name], [name] 11/25/2022 8:15 A M Medical Record Number: [id number] Patient Account Number: [id number] Date of Birth/Sex: Treating RN: 12-31-1980 (42 y.o.  [name] Primary Care Shaiann Mcmanamon: [name] Other Clinician: Betha Loa Referring Fiorella Hanahan: Treating Kenzley Ke/Extender: [name] in Treatment: 8 Edema Assessment Assessed: Kyra Searles: Yes] [Right: No] Edema: [Left: Ye] [Right: s] Calf Left: Right: Point of Measurement: 38 cm From Medial Instep 43 cm Ankle Left: Right: Point of Measurement: 10 cm From Medial Instep 26 cm Vascular Assessment Pulses: Dorsalis Pedis Palpable: [Left:Yes] Electronic Signature(s) Signed: 11/25/2022 4:57:17 PM By: Betha Loa Signed: 12/15/2022 8:06:11 AM By: Elliot Gurney, BSN, RN, CWS, Kim RN, BSN Entered By: Betha Loa on 11/25/2022 08:33:17 [name] ([id number]) [id number] Page 4 of 9 -------------------------------------------------------------------------------- Multi Wound Chart Details Patient Name: Date of Service: [name], [name] 11/25/2022 8:15 A M Medical Record Number: [id number] Patient Account Number: [id number] Date of Birth/Sex: Treating RN: August 24, 1980 (42 y.o. [name] Primary Care Candance Bohlman: [name] Other Clinician: Betha Loa Referring Dosia Yodice: Treating Marybella Ethier/Extender: [name] in Treatment: 8 Vital Signs Height(in): 73 Pulse(bpm): 90 Weight(lbs): 330 Blood Pressure(mmHg): 125/84 Body Mass Index(BMI): 43.5 Temperature(F): 98.01 Respiratory Rate(breaths/min): 16 [10:Photos:] [N/A:N/A] Left Amputation Site - Toe Left, Lateral Metatarsal head fifth N/A Wound Location: Shear/Friction Blister N/A Wounding Event: Diabetic Wound/Ulcer of the Lower Diabetic Wound/Ulcer of the Lower N/A Primary Etiology: Extremity Extremity Hypertension, Peripheral Venous Hypertension, Peripheral Venous N/A Comorbid History: Disease, Type II Diabetes, Disease, Type II Diabetes, Osteomyelitis Osteomyelitis 11/18/2022 10/07/2022 N/A Date Acquired: 1 5 N/A Weeks of  Treatment: Open Open N/A Wound Status: No No N/A Wound Recurrence: 0.2x0.1x0.2 1x1.4x1 N/A Measurements L x W x D (cm) 0.016 1.1 N/A A (cm) : rea 0.003 1.1 N/A Volume (cm) : 94.90% 74.50% N/A % Reduction in A rea: 96.80% -27.30% N/A % Reduction in Volume: Grade 1 Grade 1 N/A Classification: Medium Medium N/A Exudate A mount: Serous Serosanguineous N/A Exudate Type: amber red, brown N/A Exudate Color: N/A Flat and Intact N/A Wound Margin: N/A Small (1-33%) N/A Granulation A mount: N/A Pink N/A Granulation Quality: N/A Large (67-100%) N/A Necrotic A mount: N/A Eschar, Adherent Slough N/A Necrotic Tissue: Fat Layer (Subcutaneous Tissue): Yes Fat Layer (Subcutaneous Tissue): Yes N/A Exposed Structures: Fascia: No Fascia: No Tendon: No Tendon: No Muscle: No Muscle: No Joint: No Joint: No Bone: No Bone: No None  None N/A Epithelialization: Treatment Notes Electronic Signature(s) Signed: 11/25/2022 4:57:17 PM By: Betha Loa Entered By: Betha Loa on 11/25/2022 08:33:26 [name] ([id number]) [id number] Page 5 of 9 -------------------------------------------------------------------------------- Multi-Disciplinary Care Plan Details Patient Name: Date of Service: [name], [name] 11/25/2022 8:15 A M Medical Record Number: [id number] Patient Account Number: [id number] Date of Birth/Sex: Treating RN: 05-19-1981 (42 y.o. Loel Lofty, Selena Batten Primary Care Patricio Popwell: [name] Other Clinician: Betha Loa Referring Malikiah Debarr: Treating Laquinton Bihm/Extender: [name] in Treatment: 8 Active Inactive Necrotic Tissue Nursing Diagnoses: Knowledge deficit related to management of necrotic/devitalized tissue Goals: Necrotic/devitalized tissue will be minimized in the wound bed Date Initiated: 09/30/2022 Target Resolution Date: 10/31/2022 Goal Status: Active Interventions: Assess patient pain level  pre-, during and post procedure and prior to discharge Notes: Wound/Skin Impairment Nursing Diagnoses: Knowledge deficit related to ulceration/compromised skin integrity Goals: Patient/caregiver will verbalize understanding of skin care regimen Date Initiated: 09/30/2022 Target Resolution Date: 10/31/2022 Goal Status: Active Ulcer/skin breakdown will have a volume reduction of 30% by week 4 Date Initiated: 09/30/2022 Target Resolution Date: 10/31/2022 Goal Status: Active Ulcer/skin breakdown will have a volume reduction of 50% by week 8 Date Initiated: 09/30/2022 Target Resolution Date: 11/30/2022 Goal Status: Active Ulcer/skin breakdown will have a volume reduction of 80% by week 12 Date Initiated: 09/30/2022 Target Resolution Date: 12/31/2022 Goal Status: Active Ulcer/skin breakdown will heal within 14 weeks Date Initiated: 09/30/2022 Target Resolution Date: 01/31/2023 Goal Status: Active Interventions: Assess patient/caregiver ability to obtain necessary supplies Assess patient/caregiver ability to perform ulcer/skin care regimen upon admission and as needed Assess ulceration(s) every visit Notes: Electronic Signature(s) Signed: 11/25/2022 4:57:17 PM By: Betha Loa Signed: 12/15/2022 8:06:11 AM By: Elliot Gurney, BSN, RN, CWS, Kim RN, BSN Entered By: Betha Loa on 11/25/2022 08:53:33 [name] ([id number]) [id number] Page 6 of 9 -------------------------------------------------------------------------------- Pain Assessment Details Patient Name: Date of Service: [name], [name] 11/25/2022 8:15 A M Medical Record Number: [id number] Patient Account Number: [id number] Date of Birth/Sex: Treating RN: 06/24/1980 (42 y.o. [name] Primary Care Kylor Valverde: [name] Other Clinician: Betha Loa Referring Olga Seyler: Treating Davontay Watlington/Extender: [name] in Treatment: 8 Active Problems Location of Pain Severity and  Description of Pain Patient Has Paino No Site Locations Pain Management and Medication Current Pain Management: Electronic Signature(s) Signed: 11/25/2022 4:57:17 PM By: Betha Loa Signed: 12/15/2022 8:06:11 AM By: Elliot Gurney, BSN, RN, CWS, Kim RN, BSN Entered By: Betha Loa on 11/25/2022 08:24:59 -------------------------------------------------------------------------------- Patient/Caregiver Education Details Patient Name: Date of Service: [name] 7/3/2024andnbsp8:15 A M Medical Record Number: [id number] Patient Account Number: [id number] Date of Birth/Gender: Treating RN: [birth year] (42 y.o. [name] Primary Care Physician: [name] Other Clinician: Betha Loa Referring Physician: Treating Physician/Extender: Marx, Doig ([id number]) [id number] Page 7 of 9 Weeks in Treatment: 8 Education Assessment Education Provided To: Patient Education Topics Provided Wound/Skin Impairment: Handouts: Other: continue wound care as directed Methods: Explain/Verbal Responses: State content correctly Electronic Signature(s) Signed: 11/25/2022 4:57:17 PM By: Betha Loa Entered By: Betha Loa on 11/25/2022 09:02:29 -------------------------------------------------------------------------------- Wound Assessment Details Patient Name: Date of Service: [name] 11/25/2022 8:15 A M Medical Record Number: [id number] Patient Account Number: [id number] Date of Birth/Sex: Treating RN: 1980-11-22 (42 y.o. [name] Primary Care Austin Herd: [name] Other Clinician: Betha Loa Referring Azizah Lisle: Treating Tanise Russman/Extender: [name] in Treatment: 8 Wound Status Wound Number: 10 Primary Diabetic Wound/Ulcer of the Lower Extremity Etiology: Wound Location: Left Amputation Site -  Toe Wound Status: Open Wounding Event:  Shear/Friction Comorbid Hypertension, Peripheral Venous Disease, Type II Diabetes, Date Acquired: 11/18/2022 History: Osteomyelitis Weeks Of Treatment: 1 Clustered Wound: No Photos Wound Measurements Length: (cm) 0.2 Width: (cm) 0.1 Depth: (cm) 0.2 Area: (cm) 0.016 Volume: (cm) 0.003 % Reduction in Area: 94.9% % Reduction in Volume: 96.8% Epithelialization: None Wound Description Classification: Grade 1 Colbaugh, Hussain C ([id number]) Exudate Amount: Medium Exudate Type: Serous Exudate Color: amber Foul Odor After Cleansing: No [id number] Page 8 of 9 Slough/Fibrino No Wound Bed Exposed Structure Fascia Exposed: No Fat Layer (Subcutaneous Tissue) Exposed: Yes Tendon Exposed: No Muscle Exposed: No Joint Exposed: No Bone Exposed: No Electronic Signature(s) Signed: 11/25/2022 4:57:17 PM By: Betha Loa Signed: 12/15/2022 8:06:11 AM By: Elliot Gurney, BSN, RN, CWS, Kim RN, BSN Entered By: Betha Loa on 11/25/2022 08:31:31 -------------------------------------------------------------------------------- Wound Assessment Details Patient Name: Date of Service: [name] 11/25/2022 8:15 A M Medical Record Number: [id number] Patient Account Number: [id number] Date of Birth/Sex: Treating RN: 14-Dec-1980 (42 y.o. Loel Lofty, Selena Batten Primary Care Jaylen Claude: [name] Other Clinician: Betha Loa Referring Shykeria Sakamoto: Treating Drevin Ortner/Extender: [name] in Treatment: 8 Wound Status Wound Number: 9 Primary Diabetic Wound/Ulcer of the Lower Extremity Etiology: Wound Location: Left, Lateral Metatarsal head fifth Wound Status: Open Wounding Event: Blister Comorbid Hypertension, Peripheral Venous Disease, Type II Diabetes, Date Acquired: 10/07/2022 History: Osteomyelitis Weeks Of Treatment: 5 Clustered Wound: No Photos Wound Measurements Length: (cm) 1 Width: (cm) 1.4 Depth: (cm) 1 Area: (cm) 1.1 Volume: (cm)  1.1 % Reduction in Area: 74.5% % Reduction in Volume: -27.3% Epithelialization: None Wound Description Classification: Grade 1 Wound Margin: Flat and Intact Exudate Amount: Medium KAYDYN, SAYAS C ([id number]) Exudate Type: Serosanguineous Exudate Color: red, brown Foul Odor After Cleansing: No Slough/Fibrino Yes [id number].pdf Page 9 of 9 Wound Bed Granulation Amount: Small (1-33%) Exposed Structure Granulation Quality: Pink Fascia Exposed: No Necrotic Amount: Large (67-100%) Fat Layer (Subcutaneous Tissue) Exposed: Yes Necrotic Quality: Eschar, Adherent Slough Tendon Exposed: No Muscle Exposed: No Joint Exposed: No Bone Exposed: No Electronic Signature(s) Signed: 11/25/2022 4:57:17 PM By: Betha Loa Signed: 12/15/2022 8:06:11 AM By: Elliot Gurney, BSN, RN, CWS, Kim RN, BSN Entered By: Betha Loa on 11/25/2022 08:32:26 -------------------------------------------------------------------------------- Vitals Details Patient Name: Date of Service: [name] 11/25/2022 8:15 A M Medical Record Number: [id number] Patient Account Number: [id number] Date of Birth/Sex: Treating RN: 05-Nov-1980 (42 y.o. Loel Lofty, Selena Batten Primary Care Yachet Mattson: [name] Other Clinician: Betha Loa Referring Milton Streicher: Treating Hrishikesh Hoeg/Extender: [name] in Treatment: 8 Vital Signs Time Taken: 08:22 Temperature (F): 98.01 Height (in): 73 Pulse (bpm): 90 Weight (lbs): 330 Respiratory Rate (breaths/min): 16 Body Mass Index (BMI): 43.5 Blood Pressure (mmHg): 125/84 Reference Range: 80 - 120 mg / dl Electronic Signature(s) Signed: 11/25/2022 4:57:17 PM By: Betha Loa Entered By: Betha Loa on 11/25/2022 08:24:54

## 2022-12-15 NOTE — Progress Notes (Signed)
[name], [name] ([id number]) [id number] Page 1 of 10 Visit Report for 11/25/2022 Chief Complaint Document Details Patient Name: Date of Service: [name], [name] 11/25/2022 8:15 A M Medical Record Number: [id number] Patient Account Number: [id number] Date of Birth/Sex: Treating RN: 06/24/1980 (42 y.o. [name] Primary Care Provider: Jonah [name] Other Clinician: Betha [name] Referring Provider: Treating Provider/Extender: [name] in Treatment: 8 Information Obtained from: Patient Chief Complaint 05/13/2022; bilateral feet wounds 09/30/2022; patient returns to clinic with wounds on his left plantar foot Electronic Signature(s) Signed: 11/25/2022 4:13:50 PM By: [name] Corwin DO Entered By: [name] on 11/25/2022 09:08:43 -------------------------------------------------------------------------------- Debridement Details Patient Name: Date of Service: [name] 11/25/2022 8:15 A M Medical Record Number: [id number] Patient Account Number: [id number] Date of Birth/Sex: Treating RN: 1980-07-18 (42 y.o. [name] Primary Care Provider: Jonah [name] Other Clinician: Betha [name] Referring Provider: Treating Provider/Extender: [name] in Treatment: 8 Debridement Performed for Assessment: Wound #10 Left Amputation Site - Toe Performed By: Physician [name] Corwin, MD Debridement Type: Debridement Severity of Tissue Pre Debridement: Fat layer exposed Level of Consciousness (Pre-procedure): Awake and Alert Pre-procedure Verification/Time Out Yes - 08:48 Taken: Start Time: 08:48 Percent of Wound Bed Debrided: 100% T Area Debrided (cm): otal 0.05 Tissue and other material debrided: Viable, Non-Viable, Callus, Slough, Skin: Dermis , Skin: Epidermis, Slough Level: Skin/Epidermis Debridement Description: Selective/Open Wound Instrument:  Curette Bleeding: Minimum Hemostasis Achieved: Pressure Response to Treatment: Procedure was tolerated well Level of Consciousness (Post- Awake and Alert [name], [name] ([id number]) [id number] Page 2 of 10 Awake and Alert procedure): Post Debridement Measurements of Total Wound Length: (cm) 0.2 Width: (cm) 0.3 Depth: (cm) 0.2 Volume: (cm) 0.009 Character of Wound/Ulcer Post Debridement: Improved Severity of Tissue Post Debridement: Fat layer exposed Post Procedure Diagnosis Same as Pre-procedure Electronic Signature(s) Signed: 11/25/2022 4:13:50 PM By: [name] Corwin DO Signed: 11/25/2022 4:57:17 PM By: [name] Signed: 12/15/2022 8:06:11 AM By: Elliot Gurney, BSN, RN, CWS, Kim RN, BSN Entered By: [name] on 11/25/2022 08:50:44 -------------------------------------------------------------------------------- Debridement Details Patient Name: Date of Service: [name] 11/25/2022 8:15 A M Medical Record Number: [id number] Patient Account Number: [id number] Date of Birth/Sex: Treating RN: [birth year] (42 y.o. [name], [name] Primary Care Provider: Jonah [name] Other Clinician: Betha [name] Referring Provider: Treating Provider/Extender: [name] in Treatment: 8 Debridement Performed for Assessment: Wound #9 Left,Lateral Metatarsal head fifth Performed By: Physician [name] Corwin, MD Debridement Type: Debridement Severity of Tissue Pre Debridement: Fat layer exposed Level of Consciousness (Pre-procedure): Awake and Alert Pre-procedure Verification/Time Out Yes - 08:50 Taken: Start Time: 08:50 Percent of Wound Bed Debrided: 100% T Area Debrided (cm): otal 1.1 Tissue and other material debrided: Viable, Non-Viable, Slough, Subcutaneous, Slough Level: Skin/Subcutaneous Tissue Debridement Description: Excisional Instrument: Blade, Curette, Forceps Bleeding: Minimum Hemostasis Achieved:  Pressure Response to Treatment: Procedure was tolerated well Level of Consciousness (Post- Awake and Alert procedure): Post Debridement Measurements of Total Wound Length: (cm) 1 Width: (cm) 1.4 Depth: (cm) 1 Volume: (cm) 1.1 Character of Wound/Ulcer Post Debridement: Stable Severity of Tissue Post Debridement: Fat layer exposed Post Procedure Diagnosis Same as Pre-procedure Electronic Signature(s) Signed: 11/25/2022 4:13:50 PM By: Marylu Lund ([id number]) [id number] Page 3 of 10 Signed: 11/25/2022 4:57:17 PM By: [name] Signed: 12/15/2022 8:06:11 AM By: Elliot Gurney, BSN, RN, CWS, Kim RN, BSN Entered By: [name] on 11/25/2022 08:52:22 -------------------------------------------------------------------------------- HPI Details Patient Name: Date of Service: [name] 11/25/2022 8:15 A M  Medical Record Number: [id number] Patient Account Number: [id number] Date of Birth/Sex: Treating RN: 10/01/1980 (42 y.o. [name] Primary Care Provider: Jonah [name] Other Clinician: Betha [name] Referring Provider: Treating Provider/Extender: [name] in Treatment: 8 History of Present Illness HPI Description: 05/13/2022 [name] is a 42 year old male with uncontrolled insulin-dependent type 2 diabetes with last hemoglobin A1c of 11.1, previous feet wounds that led to bilateral great toe amputations, ADHD and bipolar depression that presents the clinic for a 1 month history of non healing ulcers to the Feet bilaterally. His previous bilateral great toe amputation sites have healed. He has been using an antibacterial spray to keep the areas clean. He has not been dressing the wounds. He states that over the past couple days he has had increased redness and warmth to the right foot. He has regular tennis shoes. He does not use any offloading device. He states he walks around in  socks and does not go barefoot. 05/20/2022. This is a 42 year old man with type 2 diabetes and neuropathy. He has wounds on his right first metatarsal head and the base of his right fourth and fifth toes. He has been using Medihoney. Apparently there was a plan to have him casted today. He has had previously amputated first toes bilaterally a year to a year and a half ago by Dr. Lajoyce Corners for underlying osteomyelitis according to the patient. The patient has not had any foot wear. Because his forefoot is so wide he simply ordered his shoes online. Last week because of redness and warmth on the left foot he was put on Augmentin and doxycycline READMISSION 09/30/2022 This is a 42 year old man who is a type II diabetic with diabetic peripheral neuropathy. He was here for the clinic in the clinic in late September 2023 for 2 visits only and on the left foot had very similar wounds to currently. He tells [name] he left the clinic related to a problem with his underlying bipolar disorder. In any case he still has an area on the left first metatarsal head and at the base of the fourth and fifth toes. This is very similar by my memory to what was present in December 2023. He tells [name] that last week or 2 this became somewhat red and warm. He used doxycycline and amoxicillin for 5 days that he had leftover from a previous visit and that helped. At this point I am not sure about his diabetes control. He had peg assist shoes last time he was here he has not been wearing these Past medical history includes type 2 diabetes with peripheral neuropathy, and bilateral great toe amputations, ADHD, bipolar disorder, ABIs in our clinic were 0.78 on the right he does not have wounds here and 1.03 on the left which is his wounded side 5/15; patient presents for follow-up. He has been using Hydrofera [name] to the wound beds. The lateral left foot wound has healed. He has 2 remaining wounds. He has no issues or complaints today. 5/29;  patient presents for follow-up. He has been using Hydrofera [name] to the wound beds. Unfortunately the lateral left foot wound has reopened. He denies signs of infection. 6/5 the patient had wounds on his first and fourth metatarsal heads which have healed. Unfortunately he comes in with erythema around the area on the left fifth metatarsal head. 6/12; patient presents for follow-up. He was prescribed doxycycline at last clinic visit due to increased erythema to the left fifth met head. According to  the patient he did not pick this up. A culture was also done that grew Streptococcus sensitive to amoxicillin. This was sent in however the patient did not pick this up either. Wound has declined in appearance and there is increased erythema to the periwound however this is not streaking or progressing compared to last clinic pictures. He knows to go to the ED if he were to have worsening symptoms including increased pain, erythema, warmth or purulent drainage 6/19; patient presents for follow-up. He has been taking Augmentin and doxycycline. His symptoms have improved with decreased erythema and swelling to the left foot/periwound. He has been using Hydrofera [name]. He denies signs of infection. 6/26; patient presents for follow-up. He has finished Augmentin and doxycycline. He has been using Hydrofera [name] to the wound bed. He has no issues or complaints. He does have excessive callus to the first met head with now a crack and open wound. 7/3; patient presents for follow-up. He continues to take Augmentin and doxycycline. He has been using Hydrofera [name] to the first met head amputation site wound and Vashe wet-to-dry dressings to the fifth met head wound. Electronic Signature(s) HILTON, SAEPHAN ([id number]) [id number] Page 4 of 10 Signed: 11/25/2022 4:13:50 PM By: [name] Corwin DO Entered By: [name] on 11/25/2022  09:09:23 -------------------------------------------------------------------------------- Physical Exam Details Patient Name: Date of Service: [name] 11/25/2022 8:15 A M Medical Record Number: [id number] Patient Account Number: [id number] Date of Birth/Sex: Treating RN: [birth year] (42 y.o. [name] Primary Care Provider: Jonah [name] Other Clinician: Betha [name] Referring Provider: Treating Provider/Extender: [name] in Treatment: 8 Constitutional . Cardiovascular . Psychiatric . Notes T the left lateral aspect of the left fifth met head there is an open wound with nonviable tissue and granulation tissue. Probes close to bone however bone is o covered at this time. No signs of surrounding infection including increased warmth, erythema or purulent drainage. T the first met head there is an open wound o with slough and callus. Postdebridement there is healthy granulation tissue present. Electronic Signature(s) Signed: 11/25/2022 4:13:50 PM By: [name] Corwin DO Entered By: [name] on 11/25/2022 09:10:10 -------------------------------------------------------------------------------- Physician Orders Details Patient Name: Date of Service: [name] 11/25/2022 8:15 A M Medical Record Number: [id number] Patient Account Number: [id number] Date of Birth/Sex: Treating RN: 10/30/1980 (42 y.o. [name] Primary Care Provider: Jonah [name] Other Clinician: Betha [name] Referring Provider: Treating Provider/Extender: [name] in Treatment: 8 Verbal / Phone Orders: Yes Clinician: Huel Coventry Read Back and Verified: Yes Diagnosis Coding Follow-up Appointments Return Appointment in 1 week. Bathing/ [employer] wounds with antibacterial soap and water. DEMONTEZ, NOVACK ([id number]) [id number] Page 5 of 10 Edema Control - Lymphedema  / Segmental Compressive Device / Other Elevate, Exercise Daily and A void Standing for Long Periods of Time. Elevate legs to the level of the heart and pump ankles as often as possible Elevate leg(s) parallel to the floor when sitting. Off-Loading Open toe surgical shoe - front offloader Turn and reposition every 2 hours Medications-Please add to medication list. ntibiotics - finish antibiotics as directed P.O. A Wound Treatment Wound #10 - Amputation Site - Toe Wound Laterality: Left Cleanser: Byram Ancillary Kit - 15 Day Supply (Generic) 1 x Per Day/30 Days Discharge Instructions: Use supplies as instructed; Kit contains: (15) Saline Bullets; (15) 3x3 Gauze; 15 pr Gloves Cleanser: Soap and Water 1 x Per Day/30 Days Discharge Instructions: Gently cleanse wound with antibacterial soap,  rinse and pat dry prior to dressing wounds Prim Dressing: Hydrofera [name] Ready Transfer Foam, 4x5 (in/in) (Dispense As Written) 1 x Per Day/30 Days ary Discharge Instructions: Apply Hydrofera [name] Ready to wound bed as directed Secondary Dressing: Kerlix 4.5 x 4.1 (in/yd) (Generic) 1 x Per Day/30 Days Discharge Instructions: Apply Kerlix 4.5 x 4.1 (in/yd) as instructed Secured With: Medipore T - 41M Medipore H Soft Cloth Surgical T ape ape, 2x2 (in/yd) (Generic) 1 x Per Day/30 Days Wound #9 - Metatarsal head fifth Wound Laterality: Left, Lateral Cleanser: Soap and Water 1 x Per Day/30 Days Discharge Instructions: Gently cleanse wound with antibacterial soap, rinse and pat dry prior to dressing wounds Prim Dressing: Gauze (Generic) 1 x Per Day/30 Days ary Discharge Instructions: moisten with Vashe and apply to wound Secondary Dressing: (BORDER) Zetuvit Plus SILICONE BORDER Dressing 5x5 (in/in) (Dispense As Written) 1 x Per Day/30 Days Discharge Instructions: Please do not put silicone bordered dressings under wraps. Use non-bordered dressing only. Electronic Signature(s) Signed: 11/25/2022 4:13:50 PM By:  [name] Corwin DO Entered By: [name] on 11/25/2022 09:21:00 -------------------------------------------------------------------------------- Problem List Details Patient Name: Date of Service: [name] 11/25/2022 8:15 A M Medical Record Number: [id number] Patient Account Number: [id number] Date of Birth/Sex: Treating RN: [birth year] (42 y.o. [name] Primary Care Provider: Jonah [name] Other Clinician: Betha [name] Referring Provider: Treating Provider/Extender: [name] in Treatment: 8 Active Problems ICD-10 Encounter Code Description Active Date MDM Diagnosis E11.621 Type 2 diabetes mellitus with foot ulcer 09/30/2022 No Yes LANTZ, HERMANN ([id number]) [id number] Page 6 of 10 [phone] Non-pressure chronic ulcer of other part of left foot with other specified 09/30/2022 No Yes severity E11.40 Type 2 diabetes mellitus with diabetic neuropathy, unspecified 09/30/2022 No Yes Inactive Problems Resolved Problems Electronic Signature(s) Signed: 11/25/2022 4:13:50 PM By: [name] Corwin DO Entered By: [name] on 11/25/2022 09:08:39 -------------------------------------------------------------------------------- Progress Note Details Patient Name: Date of Service: [name] 11/25/2022 8:15 A M Medical Record Number: [id number] Patient Account Number: [id number] Date of Birth/Sex: Treating RN: 01-Jan-1981 (42 y.o. [name], [name] Primary Care Provider: Jonah [name] Other Clinician: Betha [name] Referring Provider: Treating Provider/Extender: [name] in Treatment: 8 Subjective Chief Complaint Information obtained from Patient 05/13/2022; bilateral feet wounds 09/30/2022; patient returns to clinic with wounds on his left plantar foot History of Present Illness (HPI) 05/13/2022 [name] is a 42 year old male with uncontrolled  insulin-dependent type 2 diabetes with last hemoglobin A1c of 11.1, previous feet wounds that led to bilateral great toe amputations, ADHD and bipolar depression that presents the clinic for a 1 month history of non healing ulcers to the Feet bilaterally. His previous bilateral great toe amputation sites have healed. He has been using an antibacterial spray to keep the areas clean. He has not been dressing the wounds. He states that over the past couple days he has had increased redness and warmth to the right foot. He has regular tennis shoes. He does not use any offloading device. He states he walks around in socks and does not go barefoot. 05/20/2022. This is a 42 year old man with type 2 diabetes and neuropathy. He has wounds on his right first metatarsal head and the base of his right fourth and fifth toes. He has been using Medihoney. Apparently there was a plan to have him casted today. He has had previously amputated first toes bilaterally a year to a year and a half ago by Dr. Lajoyce Corners for underlying osteomyelitis according  to the patient. The patient has not had any foot wear. Because his forefoot is so wide he simply ordered his shoes online. Last week because of redness and warmth on the left foot he was put on Augmentin and doxycycline READMISSION 09/30/2022 This is a 42 year old man who is a type II diabetic with diabetic peripheral neuropathy. He was here for the clinic in the clinic in late September 2023 for 2 visits only and on the left foot had very similar wounds to currently. He tells [name] he left the clinic related to a problem with his underlying bipolar disorder. In any case he still has an area on the left first metatarsal head and at the base of the fourth and fifth toes. This is very similar by my memory to what was present in December 2023. He tells [name] that last week or 2 this became somewhat red and warm. He used doxycycline and amoxicillin for 5 days that he had leftover from a  previous visit and that helped. At this point I am not sure about his diabetes control. He had peg assist shoes last time he was here he has not been wearing these Past medical history includes type 2 diabetes with peripheral neuropathy, and bilateral great toe amputations, ADHD, bipolar disorder, ABIs in our clinic were 0.78 on the right he does not have wounds here and 1.03 on the left which is his wounded side 5/15; patient presents for follow-up. He has been using Hydrofera [name] to the wound beds. The lateral left foot wound has healed. He has 2 remaining wounds. [name], [name] ([id number]) [id number] Page 7 of 10 He has no issues or complaints today. 5/29; patient presents for follow-up. He has been using Hydrofera [name] to the wound beds. Unfortunately the lateral left foot wound has reopened. He denies signs of infection. 6/5 the patient had wounds on his first and fourth metatarsal heads which have healed. Unfortunately he comes in with erythema around the area on the left fifth metatarsal head. 6/12; patient presents for follow-up. He was prescribed doxycycline at last clinic visit due to increased erythema to the left fifth met head. According to the patient he did not pick this up. A culture was also done that grew Streptococcus sensitive to amoxicillin. This was sent in however the patient did not pick this up either. Wound has declined in appearance and there is increased erythema to the periwound however this is not streaking or progressing compared to last clinic pictures. He knows to go to the ED if he were to have worsening symptoms including increased pain, erythema, warmth or purulent drainage 6/19; patient presents for follow-up. He has been taking Augmentin and doxycycline. His symptoms have improved with decreased erythema and swelling to the left foot/periwound. He has been using Hydrofera [name]. He denies signs of infection. 6/26; patient  presents for follow-up. He has finished Augmentin and doxycycline. He has been using Hydrofera [name] to the wound bed. He has no issues or complaints. He does have excessive callus to the first met head with now a crack and open wound. 7/3; patient presents for follow-up. He continues to take Augmentin and doxycycline. He has been using Hydrofera [name] to the first met head amputation site wound and Vashe wet-to-dry dressings to the fifth met head wound. Objective Constitutional Vitals Time Taken: 8:22 AM, Height: 73 in, Weight: 330 lbs, BMI: 43.5, Temperature: 98.01 F, Pulse: 90 bpm, Respiratory Rate: 16 breaths/min, Blood Pressure: 125/84 mmHg. General Notes: T the left  lateral aspect of the left fifth met head there is an open wound with nonviable tissue and granulation tissue. Probes close to bone o however bone is covered at this time. No signs of surrounding infection including increased warmth, erythema or purulent drainage. T the first met head there o is an open wound with slough and callus. Postdebridement there is healthy granulation tissue present. Integumentary (Hair, Skin) Wound #10 status is Open. Original cause of wound was Shear/Friction. The date acquired was: 11/18/2022. The wound has been in treatment 1 weeks. The wound is located on the Left Amputation Site - T The wound measures 0.2cm length x 0.1cm width x 0.2cm depth; 0.016cm^2 area and 0.003cm^3 volume. oe. There is Fat Layer (Subcutaneous Tissue) exposed. There is a medium amount of serous drainage noted. Wound #9 status is Open. Original cause of wound was Blister. The date acquired was: 10/07/2022. The wound has been in treatment 5 weeks. The wound is located on the Left,Lateral Metatarsal head fifth. The wound measures 1cm length x 1.4cm width x 1cm depth; 1.1cm^2 area and 1.1cm^3 volume. There is Fat Layer (Subcutaneous Tissue) exposed. There is a medium amount of serosanguineous drainage noted. The wound margin is flat  and intact. There is small (1- 33%) pink granulation within the wound bed. There is a large (67-100%) amount of necrotic tissue within the wound bed including Eschar and Adherent Slough. Assessment Active Problems ICD-10 Type 2 diabetes mellitus with foot ulcer Non-pressure chronic ulcer of other part of left foot with other specified severity Type 2 diabetes mellitus with diabetic neuropathy, unspecified Patient's wounds have improved in size and appearance since last clinic visit. I debrided nonviable tissue. I recommended continuing with Hydrofera [name] to the first met head wound. The fifth met head wound is still fairly deep and probes close to bone however has healthier granulation tissue present. Continue vashe wet to dry dressings. He is still taking oral Augmentin and doxycycline and next week he will of completed 4 weeks of this. Wound is still too deep to put in a cast. I recommended continuing aggressive offloading and using his front offloading shoe. Follow-up in 1 week. Procedures Wound #10 Pre-procedure diagnosis of Wound #10 is a Diabetic Wound/Ulcer of the Lower Extremity located on the Left Amputation Site - T .Severity of Tissue Pre oe Debridement is: Fat layer exposed. There was a Selective/Open Wound Skin/Epidermis Debridement with a total area of 0.05 sq cm performed by [name] Corwin, MD. With the following instrument(s): Curette to remove Viable and Non-Viable tissue/material. Material removed includes Callus, Slough, Skin: Dermis, and Skin: Epidermis. A time out was conducted at 08:48, prior to the start of the procedure. A Minimum amount of bleeding was controlled with Pressure. The procedure was tolerated well. Post Debridement Measurements: 0.2cm length x 0.3cm width x 0.2cm depth; 0.009cm^3 volume. Character of Wound/Ulcer Post Debridement is improved. Severity of Tissue Post Debridement is: Fat layer exposed. Post procedure Diagnosis Wound #10: Same as  Pre-Procedure Wound #9 Pre-procedure diagnosis of Wound #9 is a Diabetic Wound/Ulcer of the Lower Extremity located on the Left,Lateral Metatarsal head fifth .Severity of Tissue [name], [name] ([id number]) [id number] Page 8 of 10 Pre Debridement is: Fat layer exposed. There was a Excisional Skin/Subcutaneous Tissue Debridement with a total area of 1.1 sq cm performed by [name] Corwin, MD. With the following instrument(s): Blade, Curette, and Forceps to remove Viable and Non-Viable tissue/material. Material removed includes Subcutaneous Tissue and Slough and. A time out was conducted at 08:50, prior  to the start of the procedure. A Minimum amount of bleeding was controlled with Pressure. The procedure was tolerated well. Post Debridement Measurements: 1cm length x 1.4cm width x 1cm depth; 1.1cm^3 volume. Character of Wound/Ulcer Post Debridement is stable. Severity of Tissue Post Debridement is: Fat layer exposed. Post procedure Diagnosis Wound #9: Same as Pre-Procedure Plan Follow-up Appointments: Return Appointment in 1 week. Bathing/ Shower/ Hygiene: Wash wounds with antibacterial soap and water. Edema Control - Lymphedema / Segmental Compressive Device / Other: Elevate, Exercise Daily and Avoid Standing for Long Periods of Time. Elevate legs to the level of the heart and pump ankles as often as possible Elevate leg(s) parallel to the floor when sitting. Off-Loading: Open toe surgical shoe - front offloader Turn and reposition every 2 hours Medications-Please add to medication list.: P.O. Antibiotics - finish antibiotics as directed WOUND #10: - Amputation Site - T oe Wound Laterality: Left Cleanser: Byram Ancillary Kit - 15 Day Supply (Generic) 1 x Per Day/30 Days Discharge Instructions: Use supplies as instructed; Kit contains: (15) Saline Bullets; (15) 3x3 Gauze; 15 pr Gloves Cleanser: Soap and Water 1 x Per Day/30 Days Discharge Instructions:  Gently cleanse wound with antibacterial soap, rinse and pat dry prior to dressing wounds Prim Dressing: Hydrofera [name] Ready Transfer Foam, 4x5 (in/in) (Dispense As Written) 1 x Per Day/30 Days ary Discharge Instructions: Apply Hydrofera [name] Ready to wound bed as directed Secondary Dressing: Kerlix 4.5 x 4.1 (in/yd) (Generic) 1 x Per Day/30 Days Discharge Instructions: Apply Kerlix 4.5 x 4.1 (in/yd) as instructed Secured With: Medipore T - 38M Medipore H Soft Cloth Surgical T ape ape, 2x2 (in/yd) (Generic) 1 x Per Day/30 Days WOUND #9: - Metatarsal head fifth Wound Laterality: Left, Lateral Cleanser: Soap and Water 1 x Per Day/30 Days Discharge Instructions: Gently cleanse wound with antibacterial soap, rinse and pat dry prior to dressing wounds Prim Dressing: Gauze (Generic) 1 x Per Day/30 Days ary Discharge Instructions: moisten with Vashe and apply to wound Secondary Dressing: (BORDER) Zetuvit Plus SILICONE BORDER Dressing 5x5 (in/in) (Dispense As Written) 1 x Per Day/30 Days Discharge Instructions: Please do not put silicone bordered dressings under wraps. Use non-bordered dressing only. 1. In office sharp debridement 2. Aggressive offloadingleft front offloading shoe 3. Hydrofera [name] 4. Vashe wet-to-dry dressings 5. Follow-up in 1 week 6. Continue oral antibiotics Electronic Signature(s) Signed: 11/25/2022 4:13:50 PM By: [name] Corwin DO Entered By: [name] on 11/25/2022 09:19:54 -------------------------------------------------------------------------------- ROS/PFSH Details Patient Name: Date of Service: [name] 11/25/2022 8:15 A M Medical Record Number: [id number] Patient Account Number: [id number] Date of Birth/Sex: Treating RN: January 18, 1981 (42 y.o. [name] Primary Care Provider: Jonah [name] Other Clinician: Betha [name] Referring Provider: Treating Provider/Extender: [name] in Treatment: 8 MILAD, BUBLITZ ([id number]) [id number] Page 9 of 10 Cardiovascular Medical History: Positive for: Hypertension; Peripheral Venous Disease Endocrine Medical History: Positive for: Type II Diabetes Time with diabetes: 2000 Treated with: Insulin, Oral agents Blood sugar tested every day: No Musculoskeletal Medical History: Positive for: Osteomyelitis Immunizations Pneumococcal Vaccine: Received Pneumococcal Vaccination: No Implantable Devices No devices added Family and Social History Never smoker; Marital Status - Single; Alcohol Use: Never; Drug Use: No History; Caffeine Use: Rarely Electronic Signature(s) Signed: 11/25/2022 4:13:50 PM By: [name] Corwin DO Signed: 12/15/2022 8:06:11 AM By: Elliot Gurney, BSN, RN, CWS, Kim RN, BSN Entered By: [name] on 11/25/2022 09:21:09 -------------------------------------------------------------------------------- SuperBill Details Patient Name: Date of Service: [name] 11/25/2022 Medical Record Number: [id number] Patient Account  Number: [id number] Date of Birth/Sex: Treating RN: January 15, 1981 (42 y.o. [name], [name] Primary Care Provider: Jonah [name] Other Clinician: Betha [name] Referring Provider: Treating Provider/Extender: [name] in Treatment: 8 Diagnosis Coding ICD-10 Codes Code Description [phone] Type 2 diabetes mellitus with foot ulcer 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rity E11.40 Type 2 diabetes mellitus with diabetic neuropathy, unspecified Facility Procedures : CPT4 Code: [id number] Description: 11042 - DEB SUBQ TISSUE 20 SQ CM/< ICD-10 Diagnosis Description 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ri E11.621 Type 2 diabetes mellitus with foot ulcer Modifier: ty Quantity: 1 : LINEBERR CPT4 Code: [id number] Y, Romir C [phone] Description: [phone] - DEBRIDE  WOUND 1ST 20 SQ CM OR < [phone]) [phone] ICD-10 Diagnosis Description 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rity E11.621 Type 2 diabetes mellitus with foot ulcer Modifier: [id number] Quantity: 1 17.pdf Page 10 of 10 Physician Procedures : CPT4 Code Description Modifier [id number] 11042 - WC PHYS SUBQ TISS 20 SQ CM ICD-10 Diagnosis Description 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rity E11.621 Type 2 diabetes mellitus with foot ulcer Quantity: 1 : [id number] - WC PHYS DEBR WO ANESTH 20 SQ CM ICD-10 Diagnosis Description 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rity E11.621 Type 2 diabetes mellitus with foot ulcer Quantity: 1 Electronic Signature(s) Signed: 11/25/2022 4:13:50 PM By: [name] Corwin DO Entered By: [name] on 11/25/2022 09:20:51

## 2022-12-15 NOTE — Progress Notes (Signed)
[name] ([id number]) [id number] Page 1 of 9 Visit Report for 12/02/2022 Chief Complaint Document Details Patient Name: Date of Service: [name], [name] 12/02/2022 8:15 A M Medical Record Number: [id number] Patient Account Number: [id number] Date of Birth/Sex: Treating RN: [birth year] (42 y.o. [name] Primary Care Provider: Jonah [name] Other Clinician: Referring Provider: Treating Provider/Extender: [name] in Treatment: 9 Information Obtained from: Patient Chief Complaint 05/13/2022; bilateral feet wounds 09/30/2022; patient returns to clinic with wounds on his left plantar foot Electronic Signature(s) Signed: 12/02/2022 12:00:03 PM By: [name] Corwin DO Entered By: [name] on 12/02/2022 10:01:17 -------------------------------------------------------------------------------- Debridement Details Patient Name: Date of Service: [name] 12/02/2022 8:15 A M Medical Record Number: [id number] Patient Account Number: [id number] Date of Birth/Sex: Treating RN: 01-Mar-1981 (42 y.o. [name] Primary Care Provider: Jonah [name] Other Clinician: Referring Provider: Treating Provider/Extender: [name] in Treatment: 9 Debridement Performed for Assessment: Wound #10 Left Amputation Site - Toe Performed By: Physician [name] Corwin, MD Debridement Type: Debridement Severity of Tissue Pre Debridement: Fat layer exposed Level of Consciousness (Pre-procedure): Awake and Alert Pre-procedure Verification/Time Out Yes - 09:00 Taken: Pain Control: Lidocaine Percent of Wound Bed Debrided: 100% T Area Debrided (cm): otal 0.16 Tissue and other material debrided: Viable, Non-Viable, Callus, Subcutaneous, Skin: Dermis , Skin: Epidermis Level: Skin/Subcutaneous Tissue Debridement Description: Excisional Instrument: Curette Bleeding: Minimum Hemostasis  Achieved: Pressure Response to Treatment: Procedure was tolerated well Level of Consciousness (Post- Awake and Alert [name] [id number] Page 2 of 9 Awake and Alert procedure): Post Debridement Measurements of Total Wound Length: (cm) 1 Width: (cm) 0.2 Depth: (cm) 0.3 Volume: (cm) 0.047 Character of Wound/Ulcer Post Debridement: Stable Severity of Tissue Post Debridement: Fat layer exposed Post Procedure Diagnosis Same as Pre-procedure Electronic Signature(s) Signed: 12/02/2022 12:00:03 PM By: [name] Corwin DO Signed: 12/15/2022 8:06:11 AM By: [name], BSN, RN, CWS, Kim RN, BSN Entered By: [name], BSN, RN, CWS, [name] on 12/02/2022 56:38:75 -------------------------------------------------------------------------------- HPI Details Patient Name: Date of Service: [name] 12/02/2022 8:15 A M Medical Record Number: [id number] Patient Account Number: [id number] Date of Birth/Sex: Treating RN: 29-Mar-1981 (42 y.o. [name] Primary Care Provider: Jonah [name] Other Clinician: Referring Provider: Treating Provider/Extender: [name] in Treatment: 9 History of Present Illness HPI Description: 05/13/2022 [name] is a 42 year old male with uncontrolled insulin-dependent type 2 diabetes with last hemoglobin A1c of 11.1, previous feet wounds that led to bilateral great toe amputations, ADHD and bipolar depression that presents the clinic for a 1 month history of non healing ulcers to the Feet bilaterally. His previous bilateral great toe amputation sites have healed. He has been using an antibacterial spray to keep the areas clean. He has not been dressing the wounds. He states that over the past couple days he has had increased redness and warmth to the right foot. He has regular tennis shoes. He does not use any offloading device. He states he walks around in socks and does not  go barefoot. 05/20/2022. This is a 42 year old man with type 2 diabetes and neuropathy. He has wounds on his right first metatarsal head and the base of his right fourth and fifth toes. He has been using Medihoney. Apparently there was a plan to have him casted today. He has had previously amputated first toes bilaterally a year to a year and a half ago by Dr. Lajoyce [name] for underlying osteomyelitis according to the patient. The patient has not  had any foot wear. Because his forefoot is so wide he simply ordered his shoes online. Last week because of redness and warmth on the left foot he was put on Augmentin and doxycycline READMISSION 09/30/2022 This is a 42 year old man who is a type II diabetic with diabetic peripheral neuropathy. He was here for the clinic in the clinic in late September 2023 for 2 visits only and on the left foot had very similar wounds to currently. He tells [name] he left the clinic related to a problem with his underlying bipolar disorder. In any case he still has an area on the left first metatarsal head and at the base of the fourth and fifth toes. This is very similar by my memory to what was present in December 2023. He tells [name] that last week or 2 this became somewhat red and warm. He used doxycycline and amoxicillin for 5 days that he had leftover from a previous visit and that helped. At this point I am not sure about his diabetes control. He had peg assist shoes last time he was here he has not been wearing these Past medical history includes type 2 diabetes with peripheral neuropathy, and bilateral great toe amputations, ADHD, bipolar disorder, ABIs in our clinic were 0.78 on the right he does not have wounds here and 1.03 on the left which is his wounded side 5/15; patient presents for follow-up. He has been using Hydrofera [name] to the wound beds. The lateral left foot wound has healed. He has 2 remaining wounds. He has no issues or complaints today. 5/29; patient presents  for follow-up. He has been using Hydrofera [name] to the wound beds. Unfortunately the lateral left foot wound has reopened. He denies signs of infection. 6/5 the patient had wounds on his first and fourth metatarsal heads which have healed. Unfortunately he comes in with erythema around the area on the left DAYLYN, AZBILL C ([id number]) [id number] Page 3 of 9 fifth metatarsal head. 6/12; patient presents for follow-up. He was prescribed doxycycline at last clinic visit due to increased erythema to the left fifth met head. According to the patient he did not pick this up. A culture was also done that grew Streptococcus sensitive to amoxicillin. This was sent in however the patient did not pick this up either. Wound has declined in appearance and there is increased erythema to the periwound however this is not streaking or progressing compared to last clinic pictures. He knows to go to the ED if he were to have worsening symptoms including increased pain, erythema, warmth or purulent drainage 6/19; patient presents for follow-up. He has been taking Augmentin and doxycycline. His symptoms have improved with decreased erythema and swelling to the left foot/periwound. He has been using Hydrofera [name]. He denies signs of infection. 6/26; patient presents for follow-up. He has finished Augmentin and doxycycline. He has been using Hydrofera [name] to the wound bed. He has no issues or complaints. He does have excessive callus to the first met head with now a crack and open wound. 7/3; patient presents for follow-up. He continues to take Augmentin and doxycycline. He has been using Hydrofera [name] to the first met head amputation site wound and Vashe wet-to-dry dressings to the fifth met head wound. 7/10; patient presents for follow-up. He is taking Augmentin and doxycycline. He has been using Hydrofera [name] to the first met head and Vashe wet-to-dry dressings to the fifth met  head. Minimal improvement. Electronic Signature(s) Signed: 12/02/2022 12:00:03 PM By: Mikey Bussing,  Shanda Bumps DO Entered By: [name] on 12/02/2022 10:02:16 -------------------------------------------------------------------------------- Physical Exam Details Patient Name: Date of Service: JAYEL, INKS 12/02/2022 8:15 A M Medical Record Number: [id number] Patient Account Number: [id number] Date of Birth/Sex: Treating RN: March 07, 1981 (42 y.o. [name] Primary Care Provider: Jonah [name] Other Clinician: Referring Provider: Treating Provider/Extender: [name] in Treatment: 9 Constitutional . Cardiovascular . Psychiatric . Notes T the left lateral aspect of the left fifth met head there is an open wound with nonviable tissue and granulation tissue. Probes close to bone No signs of o surrounding infection including increased warmth, erythema or purulent drainage. T the first met head there is an open wound with slough and callus. o Electronic Signature(s) Signed: 12/02/2022 12:00:03 PM By: [name] Corwin DO Entered By: [name] on 12/02/2022 10:10:20 [name] ([id number]) [id number] Page 4 of 9 -------------------------------------------------------------------------------- Physician Orders Details Patient Name: Date of Service: [name], [name] 12/02/2022 8:15 A M Medical Record Number: [id number] Patient Account Number: [id number] Date of Birth/Sex: Treating RN: 1981/05/08 (42 y.o. [name] Primary Care Provider: Jonah [name] Other Clinician: Referring Provider: Treating Provider/Extender: [name] in Treatment: 9 Verbal / Phone Orders: No Diagnosis Coding Follow-up Appointments Return Appointment in 1 week. Bathing/ [employer] wounds with antibacterial soap and water. Edema Control - Lymphedema / Segmental Compressive Device  / Other Elevate, Exercise Daily and A void Standing for Long Periods of Time. Elevate legs to the level of the heart and pump ankles as often as possible Elevate leg(s) parallel to the floor when sitting. Off-Loading Open toe surgical shoe - front offloader Turn and reposition every 2 hours Medications-Please add to medication list. ntibiotics - Continue antibiotics as directed (2-more week) P.O. A Wound Treatment Wound #10 - Amputation Site - Toe Wound Laterality: Left Cleanser: Soap and Water 1 x Per Day/30 Days Discharge Instructions: Gently cleanse wound with antibacterial soap, rinse and pat dry prior to dressing wounds Prim Dressing: Hydrofera [name] Ready Transfer Foam, 4x5 (in/in) (Dispense As Written) 1 x Per Day/30 Days ary Discharge Instructions: Apply Hydrofera [name] Ready to wound bed as directed Secondary Dressing: Kerlix 4.5 x 4.1 (in/yd) (Generic) 1 x Per Day/30 Days Discharge Instructions: Apply Kerlix 4.5 x 4.1 (in/yd) as instructed Secured With: Medipore T - 79M Medipore H Soft Cloth Surgical T ape ape, 2x2 (in/yd) (Generic) 1 x Per Day/30 Days Wound #9 - Metatarsal head fifth Wound Laterality: Left, Lateral Cleanser: Soap and Water 1 x Per Day/30 Days Discharge Instructions: Gently cleanse wound with antibacterial soap, rinse and pat dry prior to dressing wounds Prim Dressing: Gauze (Generic) 1 x Per Day/30 Days ary Discharge Instructions: moisten with Vashe and apply to wound Secondary Dressing: (BORDER) Zetuvit Plus SILICONE BORDER Dressing 5x5 (in/in) (Dispense As Written) 1 x Per Day/30 Days Discharge Instructions: Please do not put silicone bordered dressings under wraps. Use non-bordered dressing only. Radiology X-ray, foot - 3-view left foot, non-healing wounds, concentration on 5th met head and Great toe amputation site. Patient Medications llergies: milk A Notifications Medication Indication Start End 12/02/2022 amoxicillin-pot clavulanate DOSE 1 - oral  875 mg-125 mg tablet - 1 tablet oral twice a day x 14 days 12/02/2022 doxycycline hyclate DOSE 1 - oral 100 mg tablet - 1 tablet oral twice a day x 14 days Electronic Signature(s) Signed: 12/02/2022 12:00:03 PM By: [name] Corwin DO Previous Signature: 12/02/2022 10:16:54 AM Version By: [name] Corwin DO Entered By: [name] on 12/02/2022 10:17:08 [name],  [name] ([id number]) [id number] Page 5 of 9 Prescription 12/02/2022 -------------------------------------------------------------------------------- Mercie Eon C. [name] Corwin MD Patient Name: Provider: Oct 29, 1980 [id number] Date of Birth: NPI#: M Sex: DEA #: [phone] [id number] Phone #: License #: UPN: Patient Address: [address] [facility] [place], [place] [address] [facility] [address], [address] [place], [place] [address] [phone] Allergies milk Provider's Orders X-ray, foot - 3-view left foot, non-healing wounds, concentration on 5th met head and Great toe amputation site. Hand Signature: Date(s): Electronic Signature(s) Signed: 12/02/2022 12:00:03 PM By: [name] Corwin DO Entered By: [name] on 12/02/2022 10:17:08 -------------------------------------------------------------------------------- Problem List Details Patient Name: Date of Service: [name] 12/02/2022 8:15 A M Medical Record Number: [id number] Patient Account Number: [id number] Date of Birth/Sex: Treating RN: [birth year] (42 y.o. [name] Primary Care Provider: Jonah [name] Other Clinician: Referring Provider: Treating Provider/Extender: [name] in Treatment: 9 Active Problems ICD-10 Encounter Code Description Active Date MDM Diagnosis E11.621 Type 2 diabetes mellitus with foot ulcer 09/30/2022 No Yes L97.528 Non-pressure chronic ulcer of other part of left  foot with other specified 09/30/2022 No Yes severity [name], [name] ([id number]) [id number] Page 6 of 9 E11.40 Type 2 diabetes mellitus with diabetic neuropathy, unspecified 09/30/2022 No Yes Inactive Problems Resolved Problems Electronic Signature(s) Signed: 12/02/2022 12:00:03 PM By: [name] Corwin DO Entered By: [name] on 12/02/2022 10:01:14 -------------------------------------------------------------------------------- Progress Note Details Patient Name: Date of Service: [name] 12/02/2022 8:15 A M Medical Record Number: [id number] Patient Account Number: [id number] Date of Birth/Sex: Treating RN: 22-Sep-1980 (42 y.o. [name], [name] Primary Care Provider: Jonah [name] Other Clinician: Referring Provider: Treating Provider/Extender: [name] in Treatment: 9 Subjective Chief Complaint Information obtained from Patient 05/13/2022; bilateral feet wounds 09/30/2022; patient returns to clinic with wounds on his left plantar foot History of Present Illness (HPI) 05/13/2022 [name] is a 42 year old male with uncontrolled insulin-dependent type 2 diabetes with last hemoglobin A1c of 11.1, previous feet wounds that led to bilateral great toe amputations, ADHD and bipolar depression that presents the clinic for a 1 month history of non healing ulcers to the Feet bilaterally. His previous bilateral great toe amputation sites have healed. He has been using an antibacterial spray to keep the areas clean. He has not been dressing the wounds. He states that over the past couple days he has had increased redness and warmth to the right foot. He has regular tennis shoes. He does not use any offloading device. He states he walks around in socks and does not go barefoot. 05/20/2022. This is a 42 year old man with type 2 diabetes and neuropathy. He has wounds on his right first metatarsal head and the  base of his right fourth and fifth toes. He has been using Medihoney. Apparently there was a plan to have him casted today. He has had previously amputated first toes bilaterally a year to a year and a half ago by Dr. Lajoyce [name] for underlying osteomyelitis according to the patient. The patient has not had any foot wear. Because his forefoot is so wide he simply ordered his shoes online. Last week because of redness and warmth on the left foot he was put on Augmentin and doxycycline READMISSION 09/30/2022 This is a 42 year old man who is a type II diabetic with diabetic peripheral neuropathy. He was here for the clinic in the clinic in late September 2023 for 2 visits only and on the left foot had very  similar wounds to currently. He tells [name] he left the clinic related to a problem with his underlying bipolar disorder. In any case he still has an area on the left first metatarsal head and at the base of the fourth and fifth toes. This is very similar by my memory to what was present in December 2023. He tells [name] that last week or 2 this became somewhat red and warm. He used doxycycline and amoxicillin for 5 days that he had leftover from a previous visit and that helped. At this point I am not sure about his diabetes control. He had peg assist shoes last time he was here he has not been wearing these Past medical history includes type 2 diabetes with peripheral neuropathy, and bilateral great toe amputations, ADHD, bipolar disorder, ABIs in our clinic were 0.78 on the right he does not have wounds here and 1.03 on the left which is his wounded side 5/15; patient presents for follow-up. He has been using Hydrofera [name] to the wound beds. The lateral left foot wound has healed. He has 2 remaining wounds. He has no issues or complaints today. 5/29; patient presents for follow-up. He has been using Hydrofera [name] to the wound beds. Unfortunately the lateral left foot wound has reopened. He denies signs of  infection. [name], [name] ([id number]) [id number] Page 7 of 9 6/5 the patient had wounds on his first and fourth metatarsal heads which have healed. Unfortunately he comes in with erythema around the area on the left fifth metatarsal head. 6/12; patient presents for follow-up. He was prescribed doxycycline at last clinic visit due to increased erythema to the left fifth met head. According to the patient he did not pick this up. A culture was also done that grew Streptococcus sensitive to amoxicillin. This was sent in however the patient did not pick this up either. Wound has declined in appearance and there is increased erythema to the periwound however this is not streaking or progressing compared to last clinic pictures. He knows to go to the ED if he were to have worsening symptoms including increased pain, erythema, warmth or purulent drainage 6/19; patient presents for follow-up. He has been taking Augmentin and doxycycline. His symptoms have improved with decreased erythema and swelling to the left foot/periwound. He has been using Hydrofera [name]. He denies signs of infection. 6/26; patient presents for follow-up. He has finished Augmentin and doxycycline. He has been using Hydrofera [name] to the wound bed. He has no issues or complaints. He does have excessive callus to the first met head with now a crack and open wound. 7/3; patient presents for follow-up. He continues to take Augmentin and doxycycline. He has been using Hydrofera [name] to the first met head amputation site wound and Vashe wet-to-dry dressings to the fifth met head wound. 7/10; patient presents for follow-up. He is taking Augmentin and doxycycline. He has been using Hydrofera [name] to the first met head and Vashe wet-to-dry dressings to the fifth met head. Minimal improvement. Objective Constitutional Vitals Time Taken: 8:20 AM, Height: 73 in, Weight: 330 lbs, BMI: 43.5, Temperature: 97.7 F,  Pulse: 84 bpm, Respiratory Rate: 16 breaths/min, Blood Pressure: 130/87 mmHg. General Notes: T the left lateral aspect of the left fifth met head there is an open wound with nonviable tissue and granulation tissue. Probes close to bone No o signs of surrounding infection including increased warmth, erythema or purulent drainage. T the first met head there is an open wound with slough and  callus. o Integumentary (Hair, Skin) Wound #10 status is Open. Original cause of wound was Shear/Friction. The date acquired was: 11/18/2022. The wound has been in treatment 2 weeks. The wound is located on the Left Amputation Site - T The wound measures 1cm length x 0.2cm width x 0.2cm depth; 0.157cm^2 area and 0.031cm^3 volume. oe. There is Fat Layer (Subcutaneous Tissue) exposed. There is undermining starting at 5:00 and ending at 9:00 with a maximum distance of 0.5cm. There is a medium amount of serous drainage noted. There is no granulation within the wound bed. There is a small (1-33%) amount of necrotic tissue within the wound bed including Adherent Slough. General Notes: callus surrounding wound Wound #9 status is Open. Original cause of wound was Blister. The date acquired was: 10/07/2022. The wound has been in treatment 6 weeks. The wound is located on the Left,Lateral Metatarsal head fifth. The wound measures 1.8cm length x 1.2cm width x 0.8cm depth; 1.696cm^2 area and 1.357cm^3 volume. There is tendon and Fat Layer (Subcutaneous Tissue) exposed. There is no tunneling noted, however, there is undermining starting at 12:00 and ending at 9:00 with a maximum distance of 1.2cm. There is a medium amount of serosanguineous drainage noted. The wound margin is flat and intact. There is small (1-33%) pink granulation within the wound bed. There is a large (67-100%) amount of necrotic tissue within the wound bed including Adherent Slough. Assessment Active Problems ICD-10 Type 2 diabetes mellitus with foot  ulcer Non-pressure chronic ulcer of other part of left foot with other specified severity Type 2 diabetes mellitus with diabetic neuropathy, unspecified Patient's wounds are stable. I debrided nonviable tissue to the first met head wound. Minimal improvement from last clinic visit T both wound beds. He is at o high risk for amputation. I recommended continuing oral antibiotics to complete a total of 6 weeks. We will obtain an x-ray today. If there is cortical destruction of the bone I recommended hyperbaric oxygen therapy and he was agreeable to this. If x-ray showed negative findings will need to obtain higher level imaging. Continue aggressive offloading with front offloading shoe. Follow-up in 1 week. Procedures Wound #10 Pre-procedure diagnosis of Wound #10 is a Diabetic Wound/Ulcer of the Lower Extremity located on the Left Amputation Site - T .Severity of Tissue Pre oe Debridement is: Fat layer exposed. There was a Excisional Skin/Subcutaneous Tissue Debridement with a total area of 0.16 sq cm performed by [name] Corwin, MD. With the following instrument(s): Curette to remove Viable and Non-Viable tissue/material. Material removed includes Callus, Subcutaneous Tissue, Skin: Dermis, and Skin: Epidermis after achieving pain control using Lidocaine. No specimens were taken. A time out was conducted at 09:00, prior to the start of the procedure. A Minimum amount of bleeding was controlled with Pressure. The procedure was tolerated well. Post Debridement Measurements: 1cm length x 0.2cm width x 0.3cm depth; 0.047cm^3 volume. Character of Wound/Ulcer Post Debridement is stable. Severity of Tissue Post Debridement is: Fat layer exposed. Post procedure Diagnosis Wound #10: Same as Pre-Procedure [name], [name] ([id number]) [id number] Page 8 of 9 Plan Follow-up Appointments: Return Appointment in 1 week. Bathing/ Shower/ Hygiene: Wash wounds with antibacterial  soap and water. Edema Control - Lymphedema / Segmental Compressive Device / Other: Elevate, Exercise Daily and Avoid Standing for Long Periods of Time. Elevate legs to the level of the heart and pump ankles as often as possible Elevate leg(s) parallel to the floor when sitting. Off-Loading: Open toe surgical shoe - front offloader Turn and reposition  every 2 hours Medications-Please add to medication list.: P.O. Antibiotics - Continue antibiotics as directed (2-more week) Radiology ordered were: X-ray, foot - 3-view left foot, non-healing wounds, concentration on 5th met head and Great toe amputation site. WOUND #10: - Amputation Site - T oe Wound Laterality: Left Cleanser: Soap and Water 1 x Per Day/30 Days Discharge Instructions: Gently cleanse wound with antibacterial soap, rinse and pat dry prior to dressing wounds Prim Dressing: Hydrofera [name] Ready Transfer Foam, 4x5 (in/in) (Dispense As Written) 1 x Per Day/30 Days ary Discharge Instructions: Apply Hydrofera [name] Ready to wound bed as directed Secondary Dressing: Kerlix 4.5 x 4.1 (in/yd) (Generic) 1 x Per Day/30 Days Discharge Instructions: Apply Kerlix 4.5 x 4.1 (in/yd) as instructed Secured With: Medipore T - 48M Medipore H Soft Cloth Surgical T ape ape, 2x2 (in/yd) (Generic) 1 x Per Day/30 Days WOUND #9: - Metatarsal head fifth Wound Laterality: Left, Lateral Cleanser: Soap and Water 1 x Per Day/30 Days Discharge Instructions: Gently cleanse wound with antibacterial soap, rinse and pat dry prior to dressing wounds Prim Dressing: Gauze (Generic) 1 x Per Day/30 Days ary Discharge Instructions: moisten with Vashe and apply to wound Secondary Dressing: (BORDER) Zetuvit Plus SILICONE BORDER Dressing 5x5 (in/in) (Dispense As Written) 1 x Per Day/30 Days Discharge Instructions: Please do not put silicone bordered dressings under wraps. Use non-bordered dressing only. 1. Vashe wet-to-dry dressings 2. Hydrofera [name] 3. X-ray of the  left foot 4. Aggressive offloadingfront offloading shoe 5. Follow-up in 1 week 6. Continue oral antibiotics Electronic Signature(s) Signed: 12/02/2022 12:00:03 PM By: [name] Corwin DO Entered By: [name] on 12/02/2022 10:13:22 -------------------------------------------------------------------------------- SuperBill Details Patient Name: Date of Service: [name] 12/02/2022 Medical Record Number: [id number] Patient Account Number: [id number] Date of Birth/Sex: Treating RN: [birth year] (42 y.o. 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.40 Type 2 diabetes mellitus with diabetic neuropathy, unspecified Facility Procedures : CPT4 Code: [id number] Description: 11042 - DEB SUBQ TISSUE 20 SQ CM/< ICD-10 Diagnosis Description 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 E11.621 Type 2 diabetes mellitus with foot ulcer Modifier: rity Quantity: 1 Physician Procedures : CPT4 Code Description Modifier [id number] - WC PHYS LEVEL 3 - EST PT 25 ICD-10 Diagnosis Description E11.621 Type 2 diabetes mellitus with foot ulcer 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rity E11.40  Type 2 diabetes mellitus with diabetic neuropathy, unspecified Quantity: 1 : [id number] 11042 - WC PHYS SUBQ TISS 20 SQ CM ICD-10 Diagnosis Description 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rity E11.621 Type 2 diabetes mellitus with foot ulcer Quantity: 1 Electronic  Signature(s) Signed: 12/02/2022 12:00:03 PM By: [name] Corwin DO Entered By: [name] on 12/02/2022 10:13:42

## 2022-12-16 ENCOUNTER — Encounter (HOSPITAL_BASED_OUTPATIENT_CLINIC_OR_DEPARTMENT_OTHER): Payer: 59 | Admitting: Internal Medicine

## 2022-12-16 DIAGNOSIS — M86672 Other chronic osteomyelitis, left ankle and foot: Secondary | ICD-10-CM | POA: Diagnosis not present

## 2022-12-16 DIAGNOSIS — E11621 Type 2 diabetes mellitus with foot ulcer: Secondary | ICD-10-CM | POA: Diagnosis not present

## 2022-12-16 DIAGNOSIS — Z89412 Acquired absence of left great toe: Secondary | ICD-10-CM | POA: Diagnosis not present

## 2022-12-16 DIAGNOSIS — L97528 Non-pressure chronic ulcer of other part of left foot with other specified severity: Secondary | ICD-10-CM

## 2022-12-16 DIAGNOSIS — F319 Bipolar disorder, unspecified: Secondary | ICD-10-CM | POA: Diagnosis not present

## 2022-12-16 DIAGNOSIS — Z792 Long term (current) use of antibiotics: Secondary | ICD-10-CM | POA: Diagnosis not present

## 2022-12-16 DIAGNOSIS — Z794 Long term (current) use of insulin: Secondary | ICD-10-CM | POA: Diagnosis not present

## 2022-12-16 DIAGNOSIS — E1142 Type 2 diabetes mellitus with diabetic polyneuropathy: Secondary | ICD-10-CM | POA: Diagnosis not present

## 2022-12-16 DIAGNOSIS — F909 Attention-deficit hyperactivity disorder, unspecified type: Secondary | ICD-10-CM | POA: Diagnosis not present

## 2022-12-16 DIAGNOSIS — I10 Essential (primary) hypertension: Secondary | ICD-10-CM | POA: Diagnosis not present

## 2022-12-16 DIAGNOSIS — L97522 Non-pressure chronic ulcer of other part of left foot with fat layer exposed: Secondary | ICD-10-CM

## 2022-12-16 DIAGNOSIS — Z89411 Acquired absence of right great toe: Secondary | ICD-10-CM | POA: Diagnosis not present

## 2022-12-16 NOTE — Progress Notes (Signed)
NISAIAH, BECHTOL ([id number]) [id number] Page 1 of 10 Visit Report for 12/16/2022 Arrival Information Details Patient Name: Date of Service: MEMPHIS, [place]OTEAU 12/16/2022 8:15 A M Medical Record Number: [id number] Patient Account Number: [id number] Date of Birth/Sex: Treating RN: [birth year] (42 y.o. Barnett Abu, Leah Primary Care Kimball Manske: [name] Other Clinician: Referring Ariba Lehnen: Treating Naomee Nowland/Extender: [name] in Treatment: 11 Visit Information History Since Last Visit Added or deleted any medications: No Patient Arrived: Ambulatory Any new allergies or adverse reactions: No Arrival Time: 08:14 Had a fall or experienced change in No Accompanied By: self activities of daily living that may affect Transfer Assistance: None risk of falls: Patient Requires Transmission-Based Precautions: No Has Dressing in Place as Prescribed: Yes Patient Has Alerts: Yes Has Footwear/Offloading in Place as Prescribed: Yes Patient Alerts: Left ABI 1.03 05/13/22 Left: Wedge Shoe Right ABI .78 05/13/22 Pain Present Now: No Electronic Signature(s) Signed: 12/16/2022 4:46:32 PM By: Bonnell Public Entered By: Bonnell Public on 12/16/2022 08:15:29 -------------------------------------------------------------------------------- Encounter Discharge Information Details Patient Name: Date of Service: [name] 12/16/2022 8:15 A M Medical Record Number: [id number] Patient Account Number: [id number] Date of Birth/Sex: Treating RN: 12-03-1980 (42 y.o. Barnett Abu, Leah Primary Care Willet Schleifer: [name] Other Clinician: Referring Firas Guardado: Treating Nygeria Lager/Extender: [name] in Treatment: 11 Encounter Discharge Information Items Post Procedure Vitals Discharge Condition: Stable Temperature (F): 98.2 Ambulatory Status: Ambulatory Pulse (bpm): 105 Discharge Destination:  Home Respiratory Rate (breaths/min): 18 Transportation: Private Auto Blood Pressure (mmHg): 155/97 Schedule Follow-up Appointment: Yes Clinical Summary of Care: Electronic Signature(s) Signed: 12/16/2022 4:46:32 PM By: Cari Caraway ([id number]) [id number] Page 2 of 10 Entered By: Bonnell Public on 12/16/2022 09:07:27 -------------------------------------------------------------------------------- Lower Extremity Assessment Details Patient Name: Date of Service: MARVION, BASTIDAS 12/16/2022 8:15 A M Medical Record Number: [id number] Patient Account Number: [id number] Date of Birth/Sex: Treating RN: [birth year] (42 y.o. Barnett Abu, Leah Primary Care Raychell Holcomb: [name] Other Clinician: Referring Ilyanna Baillargeon: Treating Cal Gindlesperger/Extender: [name] in Treatment: 11 Edema Assessment Assessed: Kyra Searles: No] [Right: No] Edema: [Left: N] [Right: o] Vascular Assessment Pulses: Dorsalis Pedis Palpable: [Left:Yes] Posterior Tibial Palpable: [Left:Yes] Electronic Signature(s) Signed: 12/16/2022 4:46:32 PM By: Bonnell Public Entered By: Bonnell Public on 12/16/2022 74:25:95 -------------------------------------------------------------------------------- Multi Wound Chart Details Patient Name: Date of Service: [name] 12/16/2022 8:15 A M Medical Record Number: [id number] Patient Account Number: [id number] Date of Birth/Sex: Treating RN: 26-Aug-1980 (42 y.o. Barnett Abu, Leah Primary Care Nemiah Kissner: [name] Other Clinician: Referring Nayelis Bonito: Treating Terran Hollenkamp/Extender: [name] in Treatment: 11 Vital Signs Height(in): 73 Pulse(bpm): 103 Weight(lbs): 330 Blood Pressure(mmHg): 155/97 Body Mass Index(BMI): 43.5 Temperature(F): 98.2 Respiratory Rate(breaths/min): 18 [10:Photos:] Delagarza, Samiel C ([id number]) [10:Photos:]  [N/A:[id number] Page 3 of 10] Left Amputation Site - Toe Left, Lateral Metatarsal head fifth N/A Wound Location: Shear/Friction Blister N/A Wounding Event: Diabetic Wound/Ulcer of the Lower Diabetic Wound/Ulcer of the Lower N/A Primary Etiology: Extremity Extremity Hypertension, Peripheral Venous Hypertension, Peripheral Venous N/A Comorbid History: Disease, Type II Diabetes, Disease, Type II Diabetes, Osteomyelitis Osteomyelitis 11/18/2022 10/07/2022 N/A Date Acquired: 4 8 N/A Weeks of Treatment: Open Open N/A Wound Status: No No N/A Wound Recurrence: 0.3x0.1x0.2 0.6x1x1.3 N/A Measurements L x W x D (cm) 0.024 0.471 N/A A (cm) : rea 0.005 0.613 N/A Volume (cm) : 92.40% 89.10% N/A % Reduction in A rea: 94.70% 29.10% N/A % Reduction in Volume: 6 Starting Position 1 (o'clock): 3 Ending Position 1 (o'clock): 0.5 Maximum Distance 1 (cm):  N/A Yes N/A Undermining: Grade 1 Grade 3 N/A Classification: Small Medium N/A Exudate A mount: Serous Serosanguineous N/A Exudate Type: amber red, brown N/A Exudate Color: No Yes N/A Foul Odor A Cleansing: fter N/A No N/A Odor A nticipated Due to Product Use: N/A Flat and Intact N/A Wound Margin: Large (67-100%) None Present (0%) N/A Granulation A mount: Red, Pink N/A N/A Granulation Quality: None Present (0%) Large (67-100%) N/A Necrotic A mount: Fat Layer (Subcutaneous Tissue): Yes Fat Layer (Subcutaneous Tissue): Yes N/A Exposed Structures: Fascia: No Tendon: Yes Tendon: No Fascia: No Muscle: No Muscle: No Joint: No Joint: No Bone: No Bone: No None None N/A Epithelialization: Debridement - Selective/Open Wound N/A N/A Debridement: Pre-procedure Verification/Time Out 08:52 N/A N/A Taken: Lidocaine 2% Topical Gel N/A N/A Pain Control: Slough N/A N/A Tissue Debrided: Skin/Dermis N/A N/A Level: 0.02 N/A N/A Debridement A (sq cm): rea Curette N/A N/A Instrument: Minimum N/A  N/A Bleeding: 0 N/A N/A Procedural Pain: 0 N/A N/A Post Procedural Pain: Procedure was tolerated well N/A N/A Debridement Treatment Response: 0.3x0.1x0.2 N/A N/A Post Debridement Measurements L x W x D (cm) 0.005 N/A N/A Post Debridement Volume: (cm) Debridement N/A N/A Procedures Performed: Treatment Notes Wound #10 (Amputation Site - Toe) Wound Laterality: Left Cleanser Soap and Water Discharge Instruction: Gently cleanse wound with antibacterial soap, rinse and pat dry prior to dressing wounds Peri-Wound Care Topical Primary Dressing Hydrofera Blue Ready Transfer Foam, 4x5 (in/in) Discharge Instruction: Apply Hydrofera Blue Ready to wound bed as directed Secondary Dressing Kerlix 4.5 x 4.1 (in/yd) Discharge Instruction: Apply Kerlix 4.5 x 4.1 (in/yd) as instructed COLONEL, KRAUSER C ([id number]) [id number] Page 4 of 10 Secured With Medipore T - 42M Medipore H Soft Cloth Surgical T ape ape, 2x2 (in/yd) Compression Wrap Compression Stockings Add-Ons Wound #9 (Metatarsal head fifth) Wound Laterality: Left, Lateral Cleanser Soap and Water Discharge Instruction: Gently cleanse wound with antibacterial soap, rinse and pat dry prior to dressing wounds Peri-Wound Care Topical vashe Discharge Instruction: damp to dry daily Primary Dressing Gauze Discharge Instruction: moisten with Vashe and apply to wound Secondary Dressing Secured With Compression Wrap Compression Stockings Add-Ons Electronic Signature(s) Signed: 12/16/2022 2:12:08 PM By: Geralyn Corwin DO Entered By: Geralyn Corwin on 12/16/2022 10:31:32 -------------------------------------------------------------------------------- Multi-Disciplinary Care Plan Details Patient Name: Date of Service: [name] 12/16/2022 8:15 A M Medical Record Number: [id number] Patient Account Number: [id number] Date of Birth/Sex: Treating RN: 10-May-1981 (42 y.o. Barnett Abu,  Leah Primary Care Linkon Siverson: [name] Other Clinician: Referring Brentt Fread: Treating Parys Elenbaas/Extender: [name] in Treatment: 11 Active Inactive Necrotic Tissue Nursing Diagnoses: Knowledge deficit related to management of necrotic/devitalized tissue Goals: Necrotic/devitalized tissue will be minimized in the wound bed Date Initiated: 09/30/2022 Target Resolution Date: 01/15/2023 Goal Status: Active ADONIAS, DEMORE ([id number]) [id number] Page 5 of 10 Interventions: Assess patient pain level pre-, during and post procedure and prior to discharge Notes: Wound/Skin Impairment Nursing Diagnoses: Knowledge deficit related to ulceration/compromised skin integrity Goals: Patient/caregiver will verbalize understanding of skin care regimen Date Initiated: 09/30/2022 Date Inactivated: 12/02/2022 Target Resolution Date: 10/31/2022 Goal Status: Met Ulcer/skin breakdown will have a volume reduction of 30% by week 4 Date Initiated: 09/30/2022 Date Inactivated: 12/02/2022 Target Resolution Date: 10/31/2022 Goal Status: Unmet Unmet Reason: continue care Ulcer/skin breakdown will have a volume reduction of 50% by week 8 Date Initiated: 09/30/2022 Date Inactivated: 12/09/2022 Target Resolution Date: 11/30/2022 Goal Status: Met Ulcer/skin breakdown will have a volume reduction of 80% by week 12 Date Initiated: 09/30/2022 Target  Resolution Date: 12/31/2022 Goal Status: Active Ulcer/skin breakdown will heal within 14 weeks Date Initiated: 09/30/2022 Target Resolution Date: 01/31/2023 Goal Status: Active Interventions: Assess patient/caregiver ability to obtain necessary supplies Assess patient/caregiver ability to perform ulcer/skin care regimen upon admission and as needed Assess ulceration(s) every visit Notes: Electronic Signature(s) Signed: 12/16/2022 4:46:32 PM By: Bonnell Public Entered By: Bonnell Public on 12/16/2022  09:05:44 -------------------------------------------------------------------------------- Pain Assessment Details Patient Name: Date of Service: TORRIE, LAFAVOR 12/16/2022 8:15 A M Medical Record Number: [id number] Patient Account Number: [id number] Date of Birth/Sex: Treating RN: 1980-06-20 (42 y.o. Barnett Abu, Leah Primary Care Kelcy Laible: [name] Other Clinician: Referring Ezrah Dembeck: Treating Salaam Battershell/Extender: [name] in Treatment: 11 Active Problems Location of Pain Severity and Description of Pain Patient Has Paino No Site Locations DISHON, KEHOE ([id number]) [phone].pdf Page 6 of 10 Pain Management and Medication Current Pain Management: Electronic Signature(s) Signed: 12/16/2022 4:46:32 PM By: Bonnell Public Entered By: Bonnell Public on 12/16/2022 08:17:43 -------------------------------------------------------------------------------- Patient/Caregiver Education Details Patient Name: Date of Service: [name] 7/24/2024andnbsp8:15 A M Medical Record Number: [id number] Patient Account Number: [id number] Date of Birth/Gender: Treating RN: 1981-02-27 (42 y.o. Barnett Abu, Leah Primary Care Physician: [name] Other Clinician: Referring Physician: Treating Physician/Extender: [name] in Treatment: 11 Education Assessment Education Provided To: Patient Education Topics Provided Wound Debridement: Handouts: Wound Debridement Methods: Explain/Verbal Responses: State content correctly Electronic Signature(s) Signed: 12/16/2022 4:46:32 PM By: Bonnell Public Entered By: Bonnell Public on 12/16/2022 09:06:00 [name] ([id number]) [id number] Page 7 of 10 -------------------------------------------------------------------------------- Wound Assessment Details Patient Name: Date of Service: DAREY, HERSHBERGER  12/16/2022 8:15 A M Medical Record Number: [id number] Patient Account Number: [id number] Date of Birth/Sex: Treating RN: July 27, 1980 (42 y.o. Barnett Abu, Leah Primary Care Sheriece Jefcoat: [name] Other Clinician: Referring Aivah Putman: Treating Jaleeah Slight/Extender: [name] in Treatment: 11 Wound Status Wound Number: 10 Primary Diabetic Wound/Ulcer of the Lower Extremity Etiology: Wound Location: Left Amputation Site - Toe Wound Status: Open Wounding Event: Shear/Friction Comorbid Hypertension, Peripheral Venous Disease, Type II Diabetes, Date Acquired: 11/18/2022 History: Osteomyelitis Weeks Of Treatment: 4 Clustered Wound: No Photos Wound Measurements Length: (cm) 0.3 Width: (cm) 0.1 Depth: (cm) 0.2 Area: (cm) 0.024 Volume: (cm) 0.005 % Reduction in Area: 92.4% % Reduction in Volume: 94.7% Epithelialization: None Wound Description Classification: Grade 1 Exudate Amount: Small Exudate Type: Serous Exudate Color: amber Foul Odor After Cleansing: No Slough/Fibrino No Wound Bed Granulation Amount: Large (67-100%) Exposed Structure Granulation Quality: Red, Pink Fascia Exposed: No Necrotic Amount: None Present (0%) Fat Layer (Subcutaneous Tissue) Exposed: Yes Tendon Exposed: No Muscle Exposed: No Joint Exposed: No Bone Exposed: No Treatment Notes Wound #10 (Amputation Site - Toe) Wound Laterality: Left Cleanser Soap and Water Discharge Instruction: Gently cleanse wound with antibacterial soap, rinse and pat dry prior to dressing wounds Peri-Wound Care KILAN, BANFILL ([id number]) [id number] Page 8 of 10 Topical Primary Dressing Hydrofera Blue Ready Transfer Foam, 4x5 (in/in) Discharge Instruction: Apply Hydrofera Blue Ready to wound bed as directed Secondary Dressing Kerlix 4.5 x 4.1 (in/yd) Discharge Instruction: Apply Kerlix 4.5 x 4.1 (in/yd) as instructed Secured With Medipore T - 47M Medipore H Soft  Cloth Surgical T ape ape, 2x2 (in/yd) Compression Wrap Compression Stockings Add-Ons Electronic Signature(s) Signed: 12/16/2022 4:46:32 PM By: Bonnell Public Entered By: Bonnell Public on 12/16/2022 08:26:16 -------------------------------------------------------------------------------- Wound Assessment Details Patient Name: Date of Service: DERRIEN, ANSCHUTZ 12/16/2022 8:15 A M Medical Record Number: [id number] Patient Account Number: [id number] Date of  Birth/Sex: Treating RN: [birth year] (42 y.o. Barnett Abu, Leah Primary Care Chinaza Rooke: [name] Other Clinician: Referring Nilam Quakenbush: Treating Tida Saner/Extender: [name] in Treatment: 11 Wound Status Wound Number: 9 Primary Diabetic Wound/Ulcer of the Lower Extremity Etiology: Wound Location: Left, Lateral Metatarsal head fifth Wound Status: Open Wounding Event: Blister Comorbid Hypertension, Peripheral Venous Disease, Type II Diabetes, Date Acquired: 10/07/2022 History: Osteomyelitis Weeks Of Treatment: 8 Clustered Wound: No Photos Wound Measurements Length: (cm) 0.6 Width: (cm) 1 Depth: (cm) 1.3 Area: (cm) 0.471 Volume: (cm) 0.613 Hawn, Hanson C ([id number]) % Reduction in Area: 89.1% % Reduction in Volume: 29.1% Epithelialization: None Tunneling: No Undermining: Yes Starting Position (o'clock): 6 Ending Position (o'clock): 3 [id number] Page 9 of 10 Maximum Distance: (cm) 0.5 Wound Description Classification: Grade 3 Wound Margin: Flat and Intact Exudate Amount: Medium Exudate Type: Serosanguineous Exudate Color: red, brown Foul Odor After Cleansing: Yes Due to Product Use: No Slough/Fibrino Yes Wound Bed Granulation Amount: None Present (0%) Exposed Structure Necrotic Amount: Large (67-100%) Fascia Exposed: No Necrotic Quality: Adherent Slough Fat Layer (Subcutaneous Tissue) Exposed: Yes Tendon Exposed: Yes Muscle Exposed: No Joint  Exposed: No Bone Exposed: No Treatment Notes Wound #9 (Metatarsal head fifth) Wound Laterality: Left, Lateral Cleanser Soap and Water Discharge Instruction: Gently cleanse wound with antibacterial soap, rinse and pat dry prior to dressing wounds Peri-Wound Care Topical vashe Discharge Instruction: damp to dry daily Primary Dressing Gauze Discharge Instruction: moisten with Vashe and apply to wound Secondary Dressing Secured With Compression Wrap Compression Stockings Add-Ons Electronic Signature(s) Signed: 12/16/2022 4:46:32 PM By: Bonnell Public Entered By: Bonnell Public on 12/16/2022 08:27:06 -------------------------------------------------------------------------------- Vitals Details Patient Name: Date of Service: [name] 12/16/2022 8:15 A M Medical Record Number: [id number] Patient Account Number: [id number] Date of Birth/Sex: Treating RN: [birth year] (42 y.o. Barnett Abu, Leah Primary Care Juanette Urizar: [name] Other Clinician: Referring Anamika Kueker: Treating Hagan Maltz/Extender: [name] in Treatment: [address] ARN, MCOMBER ([id number]) [id number] Page 10 of 10 Time Taken: 08:16 Temperature (F): 98.2 Height (in): 73 Pulse (bpm): 103 Weight (lbs): 330 Respiratory Rate (breaths/min): 18 Body Mass Index (BMI): 43.5 Blood Pressure (mmHg): 155/97 Reference Range: 80 - 120 mg / dl Electronic Signature(s) Signed: 12/16/2022 4:46:32 PM By: Bonnell Public Entered By: Bonnell Public on 12/16/2022 08:18:04

## 2022-12-23 ENCOUNTER — Encounter (HOSPITAL_BASED_OUTPATIENT_CLINIC_OR_DEPARTMENT_OTHER): Payer: 59 | Admitting: Internal Medicine

## 2022-12-23 DIAGNOSIS — F319 Bipolar disorder, unspecified: Secondary | ICD-10-CM | POA: Diagnosis not present

## 2022-12-23 DIAGNOSIS — E11621 Type 2 diabetes mellitus with foot ulcer: Secondary | ICD-10-CM | POA: Diagnosis not present

## 2022-12-23 DIAGNOSIS — L97528 Non-pressure chronic ulcer of other part of left foot with other specified severity: Secondary | ICD-10-CM

## 2022-12-23 DIAGNOSIS — I10 Essential (primary) hypertension: Secondary | ICD-10-CM | POA: Diagnosis not present

## 2022-12-23 DIAGNOSIS — E1142 Type 2 diabetes mellitus with diabetic polyneuropathy: Secondary | ICD-10-CM | POA: Diagnosis not present

## 2022-12-23 DIAGNOSIS — Z792 Long term (current) use of antibiotics: Secondary | ICD-10-CM | POA: Diagnosis not present

## 2022-12-23 DIAGNOSIS — Z794 Long term (current) use of insulin: Secondary | ICD-10-CM | POA: Diagnosis not present

## 2022-12-23 DIAGNOSIS — Z89411 Acquired absence of right great toe: Secondary | ICD-10-CM | POA: Diagnosis not present

## 2022-12-23 DIAGNOSIS — F909 Attention-deficit hyperactivity disorder, unspecified type: Secondary | ICD-10-CM | POA: Diagnosis not present

## 2022-12-23 DIAGNOSIS — L97522 Non-pressure chronic ulcer of other part of left foot with fat layer exposed: Secondary | ICD-10-CM | POA: Diagnosis not present

## 2022-12-23 DIAGNOSIS — Z89412 Acquired absence of left great toe: Secondary | ICD-10-CM | POA: Diagnosis not present

## 2022-12-25 DIAGNOSIS — L97522 Non-pressure chronic ulcer of other part of left foot with fat layer exposed: Secondary | ICD-10-CM | POA: Diagnosis not present

## 2022-12-25 DIAGNOSIS — I872 Venous insufficiency (chronic) (peripheral): Secondary | ICD-10-CM | POA: Diagnosis not present

## 2022-12-25 DIAGNOSIS — L97512 Non-pressure chronic ulcer of other part of right foot with fat layer exposed: Secondary | ICD-10-CM | POA: Diagnosis not present

## 2022-12-25 DIAGNOSIS — Z89412 Acquired absence of left great toe: Secondary | ICD-10-CM | POA: Diagnosis not present

## 2022-12-25 NOTE — Progress Notes (Signed)
MAXXWELL, EDGETT ([id number]) [id number] Page 1 of 10 Visit Report for 12/23/2022 Arrival Information Details Patient Name: Date of Service: [name], [name] 12/23/2022 8:15 A M Medical Record Number: [id number] Patient Account Number: [id number] Date of Birth/Sex: Treating RN: 11/30/1980 (42 y.o. [name] Primary Care Deloise Marchant: [name] Other Clinician: Betha Loa Referring Kenry Daubert: Treating Tamber Burtch/Extender: [name] in Treatment: 12 Visit Information History Since Last Visit All ordered tests and consults were completed: No Patient Arrived: Ambulatory Added or deleted any medications: No Arrival Time: 08:20 Any new allergies or adverse reactions: No Transfer Assistance: None Had a fall or experienced change in No Patient Identification Verified: Yes activities of daily living that may affect Secondary Verification Process Completed: Yes risk of falls: Patient Requires Transmission-Based Precautions: No Signs or symptoms of abuse/neglect since last visito No Patient Has Alerts: Yes Hospitalized since last visit: No Patient Alerts: Left ABI 1.03 05/13/22 Implantable device outside of the clinic excluding No Right ABI .78 05/13/22 cellular tissue based products placed in the center since last visit: Has Dressing in Place as Prescribed: Yes Has Footwear/Offloading in Place as Prescribed: Yes Left: Wedge Shoe Pain Present Now: No Electronic Signature(s) Signed: 12/23/2022 4:54:16 PM By: Betha Loa Entered By: Betha Loa on 12/23/2022 08:21:00 -------------------------------------------------------------------------------- Clinic Level of Care Assessment Details Patient Name: Date of Service: [name], [name] 12/23/2022 8:15 A M Medical Record Number: [id number] Patient Account Number: [id number] Date of Birth/Sex: Treating RN: 11-02-1980 (42 y.o. [name] Primary Care  Charo Philipp: [name] Other Clinician: Betha Loa Referring Brentlee Delage: Treating Camala Talwar/Extender: [name] in Treatment: 12 Clinic Level of Care Assessment Items TOOL 1 Quantity Score []  - 0 Use when EandM and Procedure is performed on INITIAL visit ASSESSMENTS - Nursing Assessment / Reassessment []  - 0 General Physical Exam (combine w/ comprehensive assessment (listed just below) when performed on new pt. [address], [place] ([id number]) [id number] Page 2 of 10 []  - 0 Comprehensive Assessment (HX, ROS, Risk Assessments, Wounds Hx, etc.) ASSESSMENTS - Wound and Skin Assessment / Reassessment []  - 0 Dermatologic / Skin Assessment (not related to wound area) ASSESSMENTS - Ostomy and/or Continence Assessment and Care []  - 0 Incontinence Assessment and Management []  - 0 Ostomy Care Assessment and Management (repouching, etc.) PROCESS - Coordination of Care []  - 0 Simple Patient / Family Education for ongoing care []  - 0 Complex (extensive) Patient / Family Education for ongoing care []  - 0 Staff obtains Chiropractor, Records, T Results / Process Orders est []  - 0 Staff telephones HHA, Nursing Homes / Clarify orders / etc []  - 0 Routine Transfer to another Facility (non-emergent condition) []  - 0 Routine Hospital Admission (non-emergent condition) []  - 0 New Admissions / Manufacturing engineer / Ordering NPWT Apligraf, etc. , []  - 0 Emergency Hospital Admission (emergent condition) PROCESS - Special Needs []  - 0 Pediatric / Minor Patient Management []  - 0 Isolation Patient Management []  - 0 Hearing / Language / Visual special needs []  - 0 Assessment of Community assistance (transportation, D/C planning, etc.) []  - 0 Additional assistance / Altered mentation []  - 0 Support Surface(s) Assessment (bed, cushion, seat, etc.) INTERVENTIONS - Miscellaneous []  - 0 External ear exam []  - 0 Patient  Transfer (multiple staff / Nurse, adult / Similar devices) []  - 0 Simple Staple / Suture removal (25 or less) []  - 0 Complex Staple / Suture removal (26 or more) []  - 0 Hypo/Hyperglycemic Management (do not check if billed  separately) []  - 0 Ankle / Brachial Index (ABI) - do not check if billed separately Has the patient been seen at the hospital within the last three years: Yes Total Score: 0 Level Of Care: ____ Electronic Signature(s) Signed: 12/23/2022 4:54:16 PM By: Betha Loa Entered By: Betha Loa on 12/23/2022 08:54:46 -------------------------------------------------------------------------------- Encounter Discharge Information Details Patient Name: Date of Service: [name] 12/23/2022 8:15 A M Medical Record Number: [id number] Patient Account Number: [id number] Date of Birth/Sex: Treating RN: Jul 08, 1980 (42 y.o. [name], Kimrey, [name] C ([id number]) [id number] Page 3 of 10 Primary Care [name]: [name] Other Clinician: Betha Loa Referring [name]: Treating [name]/Extender: [name] in Treatment: 12 Encounter Discharge Information Items Post Procedure Vitals Discharge Condition: Stable Temperature (F): 98.3 Ambulatory Status: Ambulatory Pulse (bpm): 99 Discharge Destination: Home Respiratory Rate (breaths/min): 18 Transportation: Private Auto Blood Pressure (mmHg): 124/85 Accompanied By: self Schedule Follow-up Appointment: Yes Clinical Summary of Care: Electronic Signature(s) Signed: 12/23/2022 4:54:16 PM By: Betha Loa Entered By: Betha Loa on 12/23/2022 09:19:30 -------------------------------------------------------------------------------- Lower Extremity Assessment Details Patient Name: Date of Service: [name], [name] 12/23/2022 8:15 A M Medical Record Number: [id number] Patient Account Number: [id number] Date of Birth/Sex: Treating  RN: [birth year] (42 y.o. [name] Primary Care [name]: [name] Other Clinician: Betha Loa Referring [name]: Treating Merve Hotard/Extender: [name] in Treatment: 12 Vascular Assessment Pulses: Dorsalis Pedis Palpable: [Left:Yes] Toe Nail Assessment Left: Right: Thick: No Discolored: No Deformed: No Improper Length and Hygiene: Yes Electronic Signature(s) Signed: 12/23/2022 4:54:16 PM By: Betha Loa Signed: 12/25/2022 11:12:15 AM By: Elliot Gurney, BSN, RN, CWS, Kim RN, BSN Entered By: Betha Loa on 12/23/2022 08:31:04 Multi Wound Chart Details -------------------------------------------------------------------------------- [name] ([id number]) [id number] Page 4 of 10 Patient Name: Date of Service: RANDON, SOMERA 12/23/2022 8:15 A M Medical Record Number: [id number] Patient Account Number: [id number] Date of Birth/Sex: Treating RN: 10-08-1980 (42 y.o. [name] Primary Care Ikran Patman: [name] Other Clinician: Betha Loa Referring Aldean Suddeth: Treating Indiana Pechacek/Extender: [name] in Treatment: 12 Vital Signs Height(in): 73 Pulse(bpm): 99 Weight(lbs): 330 Blood Pressure(mmHg): 124/85 Body Mass Index(BMI): 43.5 Temperature(F): 98.3 Respiratory Rate(breaths/min): 18 Wound Assessments Wound Number: 10 9 N/A Photos: N/A Left Amputation Site - Toe Left, Lateral Metatarsal head fifth N/A Wound Location: Shear/Friction Blister N/A Wounding Event: Diabetic Wound/Ulcer of the Lower Diabetic Wound/Ulcer of the Lower N/A Primary Etiology: Extremity Extremity Hypertension, Peripheral Venous Hypertension, Peripheral Venous N/A Comorbid History: Disease, Type II Diabetes, Disease, Type II Diabetes, Osteomyelitis Osteomyelitis 11/18/2022 10/07/2022 N/A Date Acquired: 5 9 N/A Weeks of Treatment: Open Open N/A Wound Status: No No N/A Wound  Recurrence: 0.4x0.4x0.2 0.9x1x0.7 N/A Measurements L x W x D (cm) 0.126 0.707 N/A A (cm) : rea 0.025 0.495 N/A Volume (cm) : 59.90% 83.60% N/A % Reduction in A rea: 73.40% 42.70% N/A % Reduction in Volume: Grade 1 Grade 3 N/A Classification: Small Medium N/A Exudate A mount: Serous Serosanguineous N/A Exudate Type: amber red, brown N/A Exudate Color: No Yes N/A Foul Odor A Cleansing: fter N/A No N/A Odor A nticipated Due to Product Use: N/A Flat and Intact N/A Wound Margin: Large (67-100%) None Present (0%) N/A Granulation A mount: Red, Pink N/A N/A Granulation Quality: None Present (0%) Large (67-100%) N/A Necrotic A mount: Fat Layer (Subcutaneous Tissue): Yes Fat Layer (Subcutaneous Tissue): Yes N/A Exposed Structures: Fascia: No Tendon: Yes Tendon: No Fascia: No Muscle: No Muscle: No Joint: No Joint: No Bone: No Bone: No  None None N/A Epithelialization: Treatment Notes Electronic Signature(s) Signed: 12/23/2022 4:54:16 PM By: Betha Loa Entered By: Betha Loa on 12/23/2022 08:31:08 [name] ([id number]) [id number] Page 5 of 10 -------------------------------------------------------------------------------- Multi-Disciplinary Care Plan Details Patient Name: Date of Service: CLEBURN, MAIOLO 12/23/2022 8:15 A M Medical Record Number: [id number] Patient Account Number: [id number] Date of Birth/Sex: Treating RN: 02-10-1981 (42 y.o. Loel Lofty, Selena Batten Primary Care Ethel Veronica: [name] Other Clinician: Betha Loa Referring Kamrie Fanton: Treating Indyah Saulnier/Extender: [name] in Treatment: 12 Active Inactive Necrotic Tissue Nursing Diagnoses: Knowledge deficit related to management of necrotic/devitalized tissue Goals: Necrotic/devitalized tissue will be minimized in the wound bed Date Initiated: 09/30/2022 Target Resolution Date: 01/15/2023 Goal Status:  Active Interventions: Assess patient pain level pre-, during and post procedure and prior to discharge Notes: Wound/Skin Impairment Nursing Diagnoses: Knowledge deficit related to ulceration/compromised skin integrity Goals: Patient/caregiver will verbalize understanding of skin care regimen Date Initiated: 09/30/2022 Date Inactivated: 12/02/2022 Target Resolution Date: 10/31/2022 Goal Status: Met Ulcer/skin breakdown will have a volume reduction of 30% by week 4 Date Initiated: 09/30/2022 Date Inactivated: 12/02/2022 Target Resolution Date: 10/31/2022 Goal Status: Unmet Unmet Reason: continue care Ulcer/skin breakdown will have a volume reduction of 50% by week 8 Date Initiated: 09/30/2022 Date Inactivated: 12/09/2022 Target Resolution Date: 11/30/2022 Goal Status: Met Ulcer/skin breakdown will have a volume reduction of 80% by week 12 Date Initiated: 09/30/2022 Target Resolution Date: 12/31/2022 Goal Status: Active Ulcer/skin breakdown will heal within 14 weeks Date Initiated: 09/30/2022 Target Resolution Date: 01/31/2023 Goal Status: Active Interventions: Assess patient/caregiver ability to obtain necessary supplies Assess patient/caregiver ability to perform ulcer/skin care regimen upon admission and as needed Assess ulceration(s) every visit Notes: Electronic Signature(s) Signed: 12/23/2022 4:54:16 PM By: Betha Loa Signed: 12/25/2022 11:12:15 AM By: Elliot Gurney, BSN, RN, CWS, Kim RN, BSN Entered By: Betha Loa on 12/23/2022 08:54:57 [name] ([id number]) [id number] Page 6 of 10 -------------------------------------------------------------------------------- Pain Assessment Details Patient Name: Date of Service: DAVED, MCFANN 12/23/2022 8:15 A M Medical Record Number: [id number] Patient Account Number: [id number] Date of Birth/Sex: Treating RN: [birth year] (42 y.o. [name] Primary Care Brock Mokry: [name] Other Clinician: Betha Loa Referring Fabiha Rougeau: Treating Tamario Heal/Extender: [name] in Treatment: 12 Active Problems Location of Pain Severity and Description of Pain Patient Has Paino No Site Locations Pain Management and Medication Current Pain Management: Electronic Signature(s) Signed: 12/23/2022 4:54:16 PM By: Betha Loa Signed: 12/25/2022 11:12:15 AM By: Elliot Gurney, BSN, RN, CWS, Kim RN, BSN Entered By: Betha Loa on 12/23/2022 08:23:29 -------------------------------------------------------------------------------- Patient/Caregiver Education Details Patient Name: Date of Service: [name] 7/31/2024andnbsp8:15 A M Medical Record Number: [id number] Patient Account Number: [id number] Date of Birth/Gender: Treating RN: [birth year] (42 y.o. [name] Primary Care Physician: [name] Other Clinician: Betha Loa Referring Physician: Treating Physician/Extender: [name] in Treatment: [address], [place] ([id number]) [id number] Page 7 of 10 Education Assessment Education Provided To: Patient Education Topics Provided Wound/Skin Impairment: Handouts: Other: continue wound care as directed Methods: Explain/Verbal Responses: State content correctly Electronic Signature(s) Signed: 12/23/2022 4:54:16 PM By: Betha Loa Entered By: Betha Loa on 12/23/2022 09:18:24 -------------------------------------------------------------------------------- Wound Assessment Details Patient Name: Date of Service: [name] 12/23/2022 8:15 A M Medical Record Number: [id number] Patient Account Number: [id number] Date of Birth/Sex: Treating RN: 1980-12-30 (42 y.o. 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 Wound Status: Open Wounding Event: Shear/Friction Comorbid Hypertension, Peripheral Venous Disease, Type II Diabetes, Date Acquired: 11/18/2022 History: Osteomyelitis Weeks Of Treatment: 5 Clustered Wound: No Photos Wound Measurements Length: (cm) 0.4 Width: (cm) 0.4 Depth: (cm) 0.2 Area: (cm) 0.126 Volume: (cm) 0.025 % Reduction in Area: 59.9% % Reduction in Volume: 73.4% Epithelialization: None Wound Description Classification: Grade 1 Exudate Amount: Small Exudate Type: Serous COLTIN, CASHER C ([id number]) Exudate Color: amber Foul Odor After Cleansing: No Slough/Fibrino No [phone].pdf Page 8 of 10 Wound Bed Granulation Amount: Large (67-100%) Exposed Structure Granulation Quality: Red, Pink Fascia Exposed: No Necrotic Amount: None Present (0%) Fat Layer (Subcutaneous Tissue) Exposed: Yes Tendon Exposed: No Muscle Exposed: No Joint Exposed: No Bone Exposed: No Treatment Notes Wound #10 (Amputation Site - Toe) Wound Laterality: Left Cleanser Soap and Water Discharge Instruction: Gently cleanse wound with antibacterial soap, rinse and pat dry prior to dressing wounds Peri-Wound Care Topical Primary Dressing Hydrofera Blue Ready Transfer Foam, 4x5 (in/in) Discharge Instruction: Apply Hydrofera Blue Ready to wound bed as directed Secondary Dressing Kerlix 4.5 x 4.1 (in/yd) Discharge Instruction: Apply Kerlix 4.5 x 4.1 (in/yd) as instructed Secured With Medipore T - 34M Medipore H Soft Cloth Surgical T ape ape, 2x2 (in/yd) Compression Wrap Compression Stockings Add-Ons Electronic Signature(s) Signed: 12/23/2022 4:54:16 PM By: Betha Loa Signed: 12/25/2022 11:12:15 AM By: Elliot Gurney, BSN, RN, CWS, Kim RN, BSN Entered By: Betha Loa on 12/23/2022  08:29:48 -------------------------------------------------------------------------------- Wound Assessment Details Patient Name: Date of Service: [name] 12/23/2022 8:15 A M Medical Record Number: [id number] Patient Account Number: [id number] Date of Birth/Sex: Treating RN: 28-Mar-1981 (42 y.o. [name] Primary Care Thekla Colborn: [name] Other Clinician: Betha Loa Referring Andric Kerce: Treating Sahej Schrieber/Extender: [name] in Treatment: 12 Wound Status Wound Number: 9 Primary Diabetic Wound/Ulcer of the Lower Extremity Etiology: Wound Location: Left, Lateral Metatarsal head fifth Wound Status: Open Wounding Event: Blister Comorbid Hypertension, Peripheral Venous Disease, Type II Diabetes, Date Acquired: 10/07/2022 History: Osteomyelitis Weeks Of Treatment: 9 Clustered Wound: No LENORRIS, KARGER C ([id number]) [phone].pdf Page 9 of 10 Photos Wound Measurements Length: (cm) 0.9 Width: (cm) 1 Depth: (cm) 0.7 Area: (cm) 0.707 Volume: (cm) 0.495 % Reduction in Area: 83.6% % Reduction in Volume: 42.7% Epithelialization: None Wound Description Classification: Grade 3 Wound Margin: Flat and Intact Exudate Amount: Medium Exudate Type: Serosanguineous Exudate Color: red, brown Foul Odor After Cleansing: Yes Due to Product Use: No Slough/Fibrino Yes Wound Bed Granulation Amount: None Present (0%) Exposed Structure Necrotic Amount: Large (67-100%) Fascia Exposed: No Necrotic Quality: Adherent Slough Fat Layer (Subcutaneous Tissue) Exposed: Yes Tendon Exposed: Yes Muscle Exposed: No Joint Exposed: No Bone Exposed: No Treatment Notes Wound #9 (Metatarsal head fifth) Wound Laterality: Left, Lateral Cleanser Soap and Water Discharge Instruction: Gently cleanse wound with antibacterial soap, rinse and pat dry prior to dressing wounds Peri-Wound Care Topical vashe Discharge Instruction: damp to  dry daily Primary Dressing Gauze Discharge Instruction: moisten with Vashe and apply to wound Secondary Dressing Secured With Compression Wrap Compression Stockings Add-Ons Electronic Signature(s) Signed: 12/23/2022 4:54:16 PM By: Betha Loa Signed: 12/25/2022 11:12:15 AM By: Elliot Gurney, BSN, RN, CWS, Kim RN, BSN Entered By: Betha Loa on 12/23/2022 08:30:14 [name] ([id number]) [id number] Page 10 of 10 -------------------------------------------------------------------------------- Vitals Details Patient Name: Date of Service: JARNELL, CORDARO 12/23/2022 8:15 A M Medical Record Number: [id number] Patient Account Number: [id number] Date of Birth/Sex: Treating RN:  1981/05/14 (41 y.o. Loel Lofty, Selena Batten Primary Care Karol Liendo: [name] Other Clinician: Betha Loa Referring Gus Littler: Treating Rutger Salton/Extender: [name] in Treatment: 12 Vital Signs Time Taken: 08:21 Temperature (F): 98.3 Height (in): 73 Pulse (bpm): 99 Weight (lbs): 330 Respiratory Rate (breaths/min): 18 Body Mass Index (BMI): 43.5 Blood Pressure (mmHg): 124/85 Reference Range: 80 - 120 mg / dl Electronic Signature(s) Signed: 12/23/2022 4:54:16 PM By: Betha Loa Entered By: Betha Loa on 12/23/2022 16:10:96

## 2022-12-28 ENCOUNTER — Other Ambulatory Visit (HOSPITAL_COMMUNITY): Payer: Self-pay

## 2022-12-28 MED ORDER — CIPROFLOXACIN HCL 500 MG PO TABS
500.0000 mg | ORAL_TABLET | Freq: Two times a day (BID) | ORAL | 0 refills | Status: DC
  Filled 2022-12-28: qty 20, 10d supply, fill #0

## 2022-12-30 ENCOUNTER — Encounter: Payer: 59 | Attending: Internal Medicine | Admitting: Internal Medicine

## 2022-12-30 DIAGNOSIS — E11621 Type 2 diabetes mellitus with foot ulcer: Secondary | ICD-10-CM | POA: Diagnosis not present

## 2022-12-30 DIAGNOSIS — E1140 Type 2 diabetes mellitus with diabetic neuropathy, unspecified: Secondary | ICD-10-CM | POA: Insufficient documentation

## 2022-12-30 DIAGNOSIS — L97522 Non-pressure chronic ulcer of other part of left foot with fat layer exposed: Secondary | ICD-10-CM | POA: Insufficient documentation

## 2022-12-30 DIAGNOSIS — E1169 Type 2 diabetes mellitus with other specified complication: Secondary | ICD-10-CM | POA: Insufficient documentation

## 2022-12-30 DIAGNOSIS — M86672 Other chronic osteomyelitis, left ankle and foot: Secondary | ICD-10-CM | POA: Diagnosis not present

## 2022-12-30 DIAGNOSIS — L97528 Non-pressure chronic ulcer of other part of left foot with other specified severity: Secondary | ICD-10-CM

## 2022-12-31 ENCOUNTER — Other Ambulatory Visit (HOSPITAL_COMMUNITY): Payer: Self-pay

## 2022-12-31 ENCOUNTER — Encounter: Payer: Self-pay | Admitting: Internal Medicine

## 2022-12-31 ENCOUNTER — Ambulatory Visit: Payer: 59 | Attending: Internal Medicine | Admitting: Internal Medicine

## 2022-12-31 ENCOUNTER — Encounter: Payer: 59 | Admitting: Physician Assistant

## 2022-12-31 ENCOUNTER — Other Ambulatory Visit: Payer: Self-pay

## 2022-12-31 DIAGNOSIS — Z91199 Patient's noncompliance with other medical treatment and regimen due to unspecified reason: Secondary | ICD-10-CM | POA: Diagnosis not present

## 2022-12-31 DIAGNOSIS — M86472 Chronic osteomyelitis with draining sinus, left ankle and foot: Secondary | ICD-10-CM | POA: Diagnosis not present

## 2022-12-31 DIAGNOSIS — E08621 Diabetes mellitus due to underlying condition with foot ulcer: Secondary | ICD-10-CM | POA: Diagnosis not present

## 2022-12-31 DIAGNOSIS — Z6841 Body Mass Index (BMI) 40.0 and over, adult: Secondary | ICD-10-CM

## 2022-12-31 DIAGNOSIS — F331 Major depressive disorder, recurrent, moderate: Secondary | ICD-10-CM

## 2022-12-31 DIAGNOSIS — L97522 Non-pressure chronic ulcer of other part of left foot with fat layer exposed: Secondary | ICD-10-CM | POA: Diagnosis not present

## 2022-12-31 DIAGNOSIS — Z1159 Encounter for screening for other viral diseases: Secondary | ICD-10-CM

## 2022-12-31 DIAGNOSIS — M86672 Other chronic osteomyelitis, left ankle and foot: Secondary | ICD-10-CM | POA: Diagnosis not present

## 2022-12-31 DIAGNOSIS — F319 Bipolar disorder, unspecified: Secondary | ICD-10-CM

## 2022-12-31 DIAGNOSIS — E1169 Type 2 diabetes mellitus with other specified complication: Secondary | ICD-10-CM

## 2022-12-31 DIAGNOSIS — Z7984 Long term (current) use of oral hypoglycemic drugs: Secondary | ICD-10-CM

## 2022-12-31 DIAGNOSIS — E119 Type 2 diabetes mellitus without complications: Secondary | ICD-10-CM

## 2022-12-31 DIAGNOSIS — Z7985 Long-term (current) use of injectable non-insulin antidiabetic drugs: Secondary | ICD-10-CM

## 2022-12-31 DIAGNOSIS — E11621 Type 2 diabetes mellitus with foot ulcer: Secondary | ICD-10-CM | POA: Diagnosis not present

## 2022-12-31 DIAGNOSIS — F900 Attention-deficit hyperactivity disorder, predominantly inattentive type: Secondary | ICD-10-CM | POA: Diagnosis not present

## 2022-12-31 DIAGNOSIS — Z794 Long term (current) use of insulin: Secondary | ICD-10-CM

## 2022-12-31 DIAGNOSIS — E1140 Type 2 diabetes mellitus with diabetic neuropathy, unspecified: Secondary | ICD-10-CM | POA: Diagnosis not present

## 2022-12-31 DIAGNOSIS — E785 Hyperlipidemia, unspecified: Secondary | ICD-10-CM | POA: Diagnosis not present

## 2022-12-31 LAB — POCT GLYCOSYLATED HEMOGLOBIN (HGB A1C): HbA1c, POC (controlled diabetic range): 11.9 % — AB (ref 0.0–7.0)

## 2022-12-31 LAB — GLUCOSE, POCT (MANUAL RESULT ENTRY): POC Glucose: 268 mg/dl — AB (ref 70–99)

## 2022-12-31 LAB — GLUCOSE, CAPILLARY
Glucose-Capillary: 358 mg/dL — ABNORMAL HIGH (ref 70–99)
Glucose-Capillary: 278 mg/dL — ABNORMAL HIGH (ref 70–99)

## 2022-12-31 MED ORDER — PRAVASTATIN SODIUM 40 MG PO TABS
40.0000 mg | ORAL_TABLET | Freq: Every day | ORAL | 1 refills | Status: AC
Start: 2022-12-31 — End: ?
  Filled 2022-12-31 (×2): qty 30, 30d supply, fill #0
  Filled 2023-03-29: qty 90, 90d supply, fill #0
  Filled 2023-03-30: qty 30, 30d supply, fill #0
  Filled 2023-07-21: qty 30, 30d supply, fill #1

## 2022-12-31 MED ORDER — BASAGLAR KWIKPEN 100 UNIT/ML ~~LOC~~ SOPN
30.0000 [IU] | PEN_INJECTOR | Freq: Every day | SUBCUTANEOUS | 6 refills | Status: DC
Start: 2022-12-31 — End: 2023-01-21
  Filled 2022-12-31: qty 15, 30d supply, fill #0
  Filled 2022-12-31 (×2): qty 15, 50d supply, fill #0

## 2022-12-31 MED ORDER — SERTRALINE HCL 50 MG PO TABS
50.0000 mg | ORAL_TABLET | Freq: Every day | ORAL | 3 refills | Status: DC
Start: 2022-12-31 — End: 2023-03-29
  Filled 2022-12-31 (×3): qty 30, 30d supply, fill #0

## 2022-12-31 MED ORDER — METFORMIN HCL ER 500 MG PO TB24
2000.0000 mg | ORAL_TABLET | Freq: Every day | ORAL | 1 refills | Status: DC
  Filled 2022-12-31 – 2023-03-29 (×4): qty 120, 30d supply, fill #0
  Filled 2023-03-29 – 2023-07-21 (×2): qty 120, 30d supply, fill #1

## 2022-12-31 MED ORDER — TRULICITY 0.75 MG/0.5ML ~~LOC~~ SOAJ
0.7500 mg | SUBCUTANEOUS | 6 refills | Status: DC
Start: 2022-12-31 — End: 2023-02-28
  Filled 2022-12-31: qty 2, 28d supply, fill #0

## 2022-12-31 MED ORDER — ATOMOXETINE HCL 80 MG PO CAPS
80.0000 mg | ORAL_CAPSULE | Freq: Every day | ORAL | 0 refills | Status: DC
Start: 2022-12-31 — End: 2023-05-10
  Filled 2022-12-31 (×3): qty 30, 30d supply, fill #0

## 2022-12-31 NOTE — Progress Notes (Signed)
[name], [name] ([id number]) [id number] Page 1 of 2 Visit Report for 12/31/2022 Arrival Information Details Patient Name: Date of Service: [name], [name] 12/31/2022 10:00 A M Medical Record Number: [id number] Patient Account Number: [id number] Date of Birth/Sex: Treating RN: 01/01/1981 (42 y.o. M) Primary Care Miquan Tandon: [name] Other Clinician: Referring Simra Fiebig: Treating Denman Pichardo/Extender: Greta Doom in Treatment: 13 Visit Information History Since Last Visit Added or deleted any medications: No Patient Arrived: Ambulatory Any new allergies or adverse reactions: No Arrival Time: 10:00 Had a fall or experienced change in No Accompanied By: self activities of daily living that may affect Transfer Assistance: None risk of falls: Patient Identification Verified: Yes Signs or symptoms of abuse/neglect since last visito No Secondary Verification Process Completed: Yes Hospitalized since last visit: No Patient Requires Transmission-Based Precautions: No Implantable device outside of the clinic excluding No Patient Has Alerts: Yes cellular tissue based products placed in the center Patient Alerts: Left ABI 1.03 05/13/22 since last visit: Right ABI .78 05/13/22 Has Dressing in Place as Prescribed: Yes Pain Present Now: No Electronic Signature(s) Signed: 12/31/2022 2:00:00 PM By: Demetria Pore Entered By: Demetria Pore on 12/31/2022 13:57:32 -------------------------------------------------------------------------------- Encounter Discharge Information Details Patient Name: Date of Service: [name] 12/31/2022 10:00 A M Medical Record Number: [id number] Patient Account Number: [id number] Date of Birth/Sex: Treating RN: 04-02-1981 (42 y.o. M) Primary Care Dorie Ohms: [name] Other Clinician: Referring Alena Blankenbeckler: Treating Kyrillos Adams/Extender: Greta Doom in Treatment: 13 Encounter  Discharge Information Items Discharge Condition: Stable Ambulatory Status: Ambulatory Discharge Destination: Home Transportation: Private Auto Accompanied By: self Schedule Follow-up Appointment: Yes Clinical Summary of Care: [name], [name] ([id number]) [id number] Page 2 of 2 Electronic Signature(s) Signed: 12/31/2022 2:00:00 PM By: Demetria Pore Entered By: Demetria Pore on 12/31/2022 13:59:32 -------------------------------------------------------------------------------- Vitals Details Patient Name: Date of Service: [name] 12/31/2022 10:00 A M Medical Record Number: [id number] Patient Account Number: [id number] Date of Birth/Sex: Treating RN: 09/27/1980 (42 y.o. M) Primary Care Brode Sculley: [name] Other Clinician: Referring Joselle Deeds: Treating Skylen Spiering/Extender: Greta Doom in Treatment: 13 Vital Signs Time Taken: 10:00 Temperature (F): 98.0 Height (in): 73 Pulse (bpm): 89 Weight (lbs): 330 Respiratory Rate (breaths/min): 16 Body Mass Index (BMI): 43.5 Blood Pressure (mmHg): 126/80 Capillary Blood Glucose (mg/dl): 366 Reference Range: 80 - 120 mg / dl Airway Pulse Oximetry (%): 99 Electronic Signature(s) Signed: 12/31/2022 2:00:00 PM By: Demetria Pore Entered By: Demetria Pore on 12/31/2022 13:57:34

## 2022-12-31 NOTE — Progress Notes (Signed)
[name] ([id number]) [id number] Page 1 of 2 Visit Report for 12/31/2022 HBO Details Patient Name: Date of Service: [name], [name] 12/31/2022 10:00 A M Medical Record Number: [id number] Patient Account Number: [id number] Date of Birth/Sex: Treating RN: [birth year] (42 y.o. 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 HBO Treatment Details Treatment Number: 1 Patient Type: Outpatient Chamber Type: Monoplace Chamber Serial #: [id number] Treatment Protocol: 2.0 ATA with 90 minutes oxygen, with two 5 minute air breaks Treatment Details Compression Rate Down: 2.0 psi / minute De-Compression Rate Up: 1.0 psi / minute A breaks and breathing ir Compress Tx Pressure periods Decompress Decompress Begins Reached (leave unused spaces Begins Ends blank) Chamber Pressure (ATA 1 2 2 2 2 2  --2 1 ) Clock Time (24 hr) 11:00 11:16 11:46 11:51 12:22 12:27 - - 12:57 13:11 Treatment Length: 131 (minutes) Treatment Segments: 4 Vital Signs Capillary Blood Glucose Reference Range: 80 - 120 mg / dl HBO Diabetic Blood Glucose Intervention Range: <131 mg/dl or >366 mg/dl Time Vitals Blood Respiratory Capillary Blood Glucose Pulse Action Type: Pulse: Temperature: Taken: Pressure: Rate: Glucose (mg/dl): Meter #: Oximetry (%) Taken: Pre 10:00 126/80 89 16 98 358 1 99 Elysia Grand notified Post 13:11 128/80 92 16 97.8 278 1 98 Non per protocol Treatment Response Treatment Toleration: Well Treatment Completion Status: Treatment Completed without Adverse Event Electronic Signature(s) Signed: 12/31/2022 2:00:00 PM By: Demetria Pore Signed: 12/31/2022 5:05:18 PM By: Allen Derry PA-C Entered By: Demetria Pore on 12/31/2022 13:59:07 [name] ([id number]) [id number] Page 2 of 2 -------------------------------------------------------------------------------- HBO Safety Checklist Details Patient Name: Date of Service: [name] 12/31/2022 10:00 A M Medical Record Number: [id number] Patient Account Number: [id number] Date of Birth/Sex: Treating RN: June 29, 1980 (42 y.o. M) Primary Care Lauralynn Loeb: [name] Other Clinician: Referring Shenoa Hattabaugh: Treating Samantha Olivera/Extender: Greta Doom in Treatment: 13 HBO Safety Checklist Items Safety Checklist Consent Form Signed Patient voided / foley secured and emptied When did you last eato 12/31/22 am Last dose of injectable or oral agent over 10 days or more Ariel Dimitri notified Ostomy pouch emptied and vented if applicable NA All implantable devices assessed, documented and approved NA Intravenous access site secured and place NA Valuables secured Linens and cotton and cotton/polyester blend (less than 51% polyester) Personal oil-based products / skin lotions / body lotions removed Wigs or hairpieces removed NA Smoking or tobacco materials removed NA Books / newspapers / magazines / loose paper removed Cologne, aftershave, perfume and deodorant removed Jewelry removed (may wrap wedding band) Make-up removed NA Hair care products removed Battery operated devices (external) removed Heating patches and chemical warmers removed NA Titanium eyewear removed NA Nail polish cured greater than 10 hours NA Casting material cured greater than 10 hours NA Hearing aids removed NA Loose dentures or partials removed NA Prosthetics have been removed NA Patient demonstrates correct use of air break device (if applicable) Patient concerns have been addressed Patient grounding bracelet on and cord attached to  chamber Specifics for Inpatients (complete in addition to above) Medication sheet sent with patient NA Intravenous medications needed or due during therapy sent with patient NA Drainage tubes (e.g. nasogastric tube or chest tube secured and vented) NA Endotracheal or Tracheotomy tube secured NA Cuff deflated of  air and inflated with saline NA Airway suctioned NA Electronic Signature(s) Signed: 12/31/2022 2:00:00 PM By: Demetria Pore Entered By: Demetria Pore on 12/31/2022 13:57:37

## 2022-12-31 NOTE — Progress Notes (Signed)
Patient ID: [name], male    DOB: 26-Aug-1980  MRN: [id number]  CC: Diabetes (DM f/u. Med refills. /Discuss trulicity - pt unable to afford due to large copay/Requesting handicap placard)   Subjective: [name] is a 42 y.o. male who presents for chronic ds management. Last seen 04/2022 His concerns today include:  Pt with hx of DM type 2, HL, HTN (off meds x 15 yrs) obesity, DM foot ulcers, amputation LT big toe (02/2020), MDD, obesity, RT big toe amputation 2023   DM: Results for orders placed or performed in visit on 12/31/22  POCT glycosylated hemoglobin (Hb A1C)  Result Value Ref Range   Hemoglobin A1C     HbA1c POC (<> result, manual entry)     HbA1c, POC (prediabetic range)     HbA1c, POC (controlled diabetic range) 11.9 (A) 0.0 - 7.0 %  POCT glucose (manual entry)  Result Value Ref Range   POC Glucose 268 (A) 70 - 99 mg/dl  On last visit, we left patient on Farxiga 10 mg 3 times a week, metformin XR 500 mg 4 tablets daily, Basaglar 48 units daily and Trulicity 1.5 mg once a week. Off everything for several mths. Reports he got off track.  Harder when off his psychiatric meds. Trulicity co-pay went up from $40 to $907 now that he has [employer]. Still has some Metformin and insulin but not taking consistently. Not check BS.  Poor eating habits.  "I snack too much."  No income.  Eats whatever his dad keeps in the house.   No blurred vision.  Due for eye exam. No numbness in feet.  LT foot Ulcers:   Still followed at Wound Care.  Working on trying to get 2 ulcers to heal on LT foot.  He also on the low lateral aspect of the left foot has been there about 6 months.  He is having a lot of drainage.  X-ray done a month ago by wound care showed large soft tissue ulceration overlying the fifth metatarsal head with underlying osteomyelitis, septic arthritis and pathologic fracture through the fifth metatarsal head.  Last saw Dr. Lajoyce Corners 04/2022  HL: Should be on  pravastatin 40 mg daily. Does have it but not taking it consistently  MDD/Bipolar/ADHD: has not been seen since 01/2022.  Tried calling several times, goes to [facility].  Out of Zoloft and Strattera for several mths.  BH will not refill until he is seen.   Wants to apply for disability. Patient Active Problem List   Diagnosis Date Noted   MDD (major depressive disorder), recurrent, in partial remission (HCC) 11/19/2021   Osteomyelitis of great toe of right foot (HCC)    Bipolar depression (HCC) 09/10/2021   Adjustment disorder with mixed anxiety and depressed mood 09/10/2021   Attention deficit hyperactivity disorder (ADHD), predominantly inattentive type 07/30/2020   Anxiety with depression 06/07/2020   Hyperlipidemia due to type 2 diabetes mellitus (HCC) 03/27/2020   Status post amputation of left great toe (HCC) 03/26/2020   Influenza vaccination declined 03/26/2020   23-polyvalent pneumococcal polysaccharide vaccine declined 03/26/2020   COVID-19 vaccine series completed 03/26/2020   Moderate major depression, single episode (HCC) 03/26/2020   Anaerobic bacteremia 03/07/2020   Major depressive disorder, recurrent episode, moderate (HCC) 03/04/2020   Abscess of great toe, left    Sepsis due to group B Streptococcus with acute renal failure (HCC)    Cellulitis of left toe 03/03/2020   Diabetic ketoacidosis (HCC) 03/03/2020  DKA (diabetic ketoacidosis) (HCC) 03/03/2020   Ulcer of right foot with fat layer exposed (HCC) 06/06/2019   Non-pressure chronic ulcer of other part of left foot limited to breakdown of skin (HCC) 06/06/2019   Uncontrolled type 2 diabetes mellitus with hyperglycemia (HCC) 05/02/2018   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) 07/30/2017   Venous insufficiency of both lower extremities 07/30/2017   Obesity, Class III, BMI 40-49.9 (morbid obesity) (HCC) 07/30/2017   Essential hypertension 09/03/2016     Current Outpatient Medications on File Prior to Visit  Medication  Sig Dispense Refill   blood glucose meter kit and supplies Dispense based on patient and insurance preference. Use up to four times daily as directed. (FOR ICD-10 E10.9, E11.9). 1 each 0   ciprofloxacin (CIPRO) 500 MG tablet Take 1 tablet (500 mg total) by mouth 2 (two) times daily for 10 days 20 tablet 0   doxycycline (VIBRA-TABS) 100 MG tablet Take 1 tablet (100 mg total) by mouth 2 (two) times daily for 14 days 28 tablet 0   Insulin Pen Needle (PEN NEEDLES) 31G X 8 MM MISC UAD 100 each 6   No current facility-administered medications on file prior to visit.    Allergies  Allergen Reactions   Milk-Related Compounds Diarrhea    Social History   Socioeconomic History   Marital status: Single    Spouse name: Not on file   Number of children: Not on file   Years of education: Not on file   Highest education level: Not on file  Occupational History   Occupation: Unemployed  Tobacco Use   Smoking status: Never   Smokeless tobacco: Never  Vaping Use   Vaping status: Never Used  Substance and Sexual Activity   Alcohol use: Not Currently    Comment: last drink 2018   Drug use: Never   Sexual activity: Not on file  Other Topics Concern   Not on file  Social History Narrative   Live with parents in [place].    Social Determinants of Health   Financial Resource Strain: Not on file  Food Insecurity: Not on file  Transportation Needs: Not on file  Physical Activity: Not on file  Stress: Not on file  Social Connections: Not on file  Intimate Partner Violence: Not on file    Family History  Problem Relation Age of Onset   Diabetes Mother    Hyperlipidemia Father    Hypertension Father     Past Surgical History:  Procedure Laterality Date   AMPUTATION Left 03/06/2020   Procedure: LEFT GREAT TOE AMPUTATION;  Surgeon: [name], MD;  Location: [facility] OR;  Service: Orthopedics;  Laterality: Left;   AMPUTATION Right 10/15/2021   Procedure: RIGHT GREAT TOE AMPUTATION;   Surgeon: [name], MD;  Location: [facility] OR;  Service: Orthopedics;  Laterality: Right;   EYE SURGERY Bilateral    lasik   WISDOM TOOTH EXTRACTION      ROS: Review of Systems Negative except as stated above  PHYSICAL EXAM: BP 125/83 (BP Location: Left Arm, Patient Position: Sitting, Cuff Size: Large)   Pulse 98   Temp 98.3 F (36.8 C) (Oral)   Ht 6\' 1"  (1.854 m)   Wt (!) 318 lb (144.2 kg)   SpO2 100%   BMI 41.96 kg/m   Wt Readings from Last 3 Encounters:  12/31/22 (!) 318 lb (144.2 kg)  04/28/22 (!) 331 lb (150.1 kg)  12/23/21 (!) 337 lb (152.9 kg)    Physical Exam  General appearance - alert, well appearing, and in no distress Mental status - normal mood, behavior, speech, dress, motor activity, and thought processes Neck - supple, no significant adenopathy Chest - clear to auscultation, no wheezes, rales or rhonchi, symmetric air entry Heart - normal rate, regular rhythm, normal S1, S2, no murmurs, rubs, clicks or gallops Extremities -no lower extremity edema. Diabetic Foot Exam - Simple   Simple Foot Form Diabetic Foot exam was performed with the following findings: Yes 12/31/2022  3:21 PM  Visual Inspection See comments: Yes Sensation Testing See comments: Yes Pulse Check Posterior Tibialis and Dorsalis pulse intact bilaterally: Yes Comments Left foot: About a 1 cm deep ulcer over midportion of the fifth metatarsal.  A lot of yellowish drainage is on the dressing that was removed.  Smaller ulcer that is scabbed over beneath the first metatarsal.  Big toe has been amputated. Right foot: Big toe has been amputated.  Scabbed over callus over the first metatarsal. Decreased sensation on the plantar surface of both feet on leap exam.         12/31/2022    2:20 PM 04/28/2022   11:22 AM 01/23/2022   10:00 AM  Depression screen PHQ 2/9  Decreased Interest 2 2 1   Down, Depressed, Hopeless 1 1 1   PHQ - 2 Score 3 3 2   Altered sleeping 3 2 2   Tired, decreased energy  2 1 1   Change in appetite 2 1 0  Feeling bad or failure about yourself  1 2 1   Trouble concentrating 1 1 2   Moving slowly or fidgety/restless 0 0 0  Suicidal thoughts 0 0 0  PHQ-9 Score 12 10 8   Difficult doing work/chores   Not difficult at all      12/31/2022    2:20 PM 04/28/2022   11:22 AM 01/23/2022   10:01 AM 12/23/2021   11:23 AM  GAD 7 : Generalized Anxiety Score  Nervous, Anxious, on Edge 1 2 1 1   Control/stop worrying 1 1 1  0  Worry too much - different things 0 1 0 1  Trouble relaxing 1 1 0 2  Restless 1 0 0 0  Easily annoyed or irritable 3 2 2 2   Afraid - awful might happen 0 0 0 1  Total GAD 7 Score 7 7 4 7   Anxiety Difficulty   Not difficult at all         Latest Ref Rng & Units 04/28/2022   12:20 PM 10/15/2021    7:26 AM 06/05/2021   11:14 AM  CMP  Glucose 70 - 99 mg/dL 161  096  045   BUN 6 - 24 mg/dL 11  12  9    Creatinine 0.76 - 1.27 mg/dL 4.09  8.11  9.14   Sodium 134 - 144 mmol/L 139  135  136   Potassium 3.5 - 5.2 mmol/L 4.5  4.2  4.4   Chloride 96 - 106 mmol/L 99  102  96   CO2 20 - 29 mmol/L 23  23  21    Calcium 8.7 - 10.2 mg/dL 78.2  8.8  9.3   Total Protein 6.0 - 8.5 g/dL 8.2   8.2   Total Bilirubin 0.0 - 1.2 mg/dL 0.5   0.3   Alkaline Phos 44 - 121 IU/L 120   131   AST 0 - 40 IU/L 19   17   ALT 0 - 44 IU/L 21   19    Lipid Panel  Component Value Date/Time   CHOL 183 06/05/2021 1114   TRIG 252 (H) 06/05/2021 1114   HDL 32 (L) 06/05/2021 1114   CHOLHDL 5.7 (H) 06/05/2021 1114   LDLCALC 107 (H) 06/05/2021 1114    CBC    Component Value Date/Time   WBC 10.0 10/15/2021 0726   RBC 5.57 10/15/2021 0726   HGB 15.0 10/15/2021 0726   HGB 15.0 06/04/2016 1030   HCT 46.9 10/15/2021 0726   HCT 45.8 06/04/2016 1030   PLT 275 10/15/2021 0726   PLT 254 06/04/2016 1030   MCV 84.2 10/15/2021 0726   MCV 82.6 05/02/2018 1521   MCV 83 06/04/2016 1030   MCH 26.9 10/15/2021 0726   MCHC 32.0 10/15/2021 0726   RDW 13.5 10/15/2021 0726   RDW 14.1  06/04/2016 1030   LYMPHSABS 1.9 03/08/2020 0002   LYMPHSABS 3.3 (H) 06/04/2016 1030   MONOABS 0.7 03/08/2020 0002   EOSABS 0.1 03/08/2020 0002   EOSABS 0.1 06/04/2016 1030   BASOSABS 0.1 03/08/2020 0002   BASOSABS 0.0 06/04/2016 1030    ASSESSMENT AND PLAN: 1. Type 2 diabetes mellitus with morbid obesity (HCC) 2. Long-term (current) use of injectable non-insulin antidiabetic drugs 3. Diabetes mellitus treated with oral medication (HCC) 4. Insulin long-term use (HCC) -Discussed the importance of getting back on his diabetic medications.  Better control of his diabetes will also help with wound healing. I spoke with our clinical pharmacist, we may be able to get a discount card for Trulicity.  We will restart Trulicity at 0.75 mg once a week since he has been off of it for about 7 months.  Continue metformin 2000 mg daily.  Restart Basaglar insulin at 30 units daily.  We will hold off on the Farxiga for now. -Discussed and encourage healthy eating habits.  However patient states he has to eat what he can get. -Agreeable to referral for eye exam. -Discussed CGM.  However patient states that he cannot wear a CGM in the hyperbaric chamber for wound care.  Message sent to Dr. Carolyn Stare inquiring about whether this is the case or not.  If indeed he cannot have on his CGM then, I advised that he checks his blood sugars at least once if not twice a day before breakfast and dinner.  We will have him follow-up with the clinical pharmacist in 1 month. - POCT glycosylated hemoglobin (Hb A1C) - POCT glucose (manual entry) - Ambulatory referral to Ophthalmology - CBC - Comprehensive metabolic panel - Lipid panel - Microalbumin / creatinine urine ratio - Insulin Glargine (BASAGLAR KWIKPEN) 100 UNIT/ML; Inject 30 Units into the skin at bedtime. Increase by 2 units every 3 days until CBGs <130. Max: 50 units.  Dispense: 15 mL; Refill: 6 - metFORMIN (GLUCOPHAGE-XR) 500 MG 24 hr tablet; Take 4 tablets (2,000  mg total) by mouth daily with breakfast.  Dispense: 360 tablet; Refill: 1 - Dulaglutide (TRULICITY) 0.75 MG/0.5ML SOPN; Inject 0.75 mg into the skin once a week.  Dispense: 2 mL; Refill: 6   5. Diabetic ulcer of other part of left foot associated with diabetes mellitus due to underlying condition, with fat layer exposed (HCC) 6. 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  Looking at wound care's note, it is mentioned that it can be probe to the bone.  Will get him back in with Dr. Lajoyce Corners. Form signed for temporary handicap sticker for 1 year. -  Ambulatory referral to Orthopedics - Sedimentation Rate - C-reactive protein  7. Hyperlipidemia due to type 2 diabetes mellitus (HCC) Patient agreeable to restarting Pravachol - pravastatin (PRAVACHOL) 40 MG tablet; Take 1 tablet (40 mg total) by mouth once daily.  Dispense: 90 tablet; Refill: 1  8. Major depressive disorder, recurrent episode, moderate (HCC) 9. Bipolar depression (HCC) -Refill given on Zoloft and Strattera while we attempt to get him back in with behavioral health 10. Attention deficit hyperactivity disorder (ADHD), predominantly inattentive type - sertraline (ZOLOFT) 50 MG tablet; Take 1 tablet (50 mg total) by mouth daily.  Dispense: 30 tablet; Refill: 3 - Ambulatory referral to Psychiatry - atomoxetine (STRATTERA) 80 MG capsule; Take 1 capsule (80 mg total) by mouth daily.  Dispense: 30 capsule; Refill: 0  11. Need for hepatitis C screening test - Hepatitis C Antibody  12.  Nonadherence to medical treatment -Becomes problematic when his mental health is not under good control.  We have restarted Zoloft and Strattera and will try get him back in with behavioral health.  Patient was given the opportunity to ask questions.  Patient verbalized understanding of the plan and was able to repeat key elements of the plan.   This  documentation was completed using Paediatric nurse.  Any transcriptional errors are unintentional.  Orders Placed This Encounter  Procedures   CBC   Comprehensive metabolic panel   Lipid panel   Microalbumin / creatinine urine ratio   Hepatitis C Antibody   Sedimentation Rate   C-reactive protein   Ambulatory referral to Ophthalmology   Ambulatory referral to Orthopedics   Ambulatory referral to Psychiatry   POCT glycosylated hemoglobin (Hb A1C)   POCT glucose (manual entry)     Requested Prescriptions   Signed Prescriptions Disp Refills   pravastatin (PRAVACHOL) 40 MG tablet 90 tablet 1    Sig: Take 1 tablet (40 mg total) by mouth once daily.   atomoxetine (STRATTERA) 80 MG capsule 30 capsule 0    Sig: Take 1 capsule (80 mg total) by mouth daily.   Insulin Glargine (BASAGLAR KWIKPEN) 100 UNIT/ML 15 mL 6    Sig: Inject 30 Units into the skin at bedtime. Increase by 2 units every 3 days until CBGs <130. Max: 50 units.   metFORMIN (GLUCOPHAGE-XR) 500 MG 24 hr tablet 360 tablet 1    Sig: Take 4 tablets (2,000 mg total) by mouth daily with breakfast.   sertraline (ZOLOFT) 50 MG tablet 30 tablet 3    Sig: Take 1 tablet (50 mg total) by mouth daily.   Dulaglutide (TRULICITY) 0.75 MG/0.5ML SOPN 2 mL 6    Sig: Inject 0.75 mg into the skin once a week.    Return in about 3 months (around 04/02/2023) for Appt with [facility] in 2 wks for DM check.  [name], MD, FACP

## 2023-01-01 ENCOUNTER — Encounter: Payer: 59 | Admitting: Physician Assistant

## 2023-01-01 DIAGNOSIS — E1169 Type 2 diabetes mellitus with other specified complication: Secondary | ICD-10-CM | POA: Diagnosis not present

## 2023-01-01 DIAGNOSIS — E1140 Type 2 diabetes mellitus with diabetic neuropathy, unspecified: Secondary | ICD-10-CM | POA: Diagnosis not present

## 2023-01-01 DIAGNOSIS — L97522 Non-pressure chronic ulcer of other part of left foot with fat layer exposed: Secondary | ICD-10-CM | POA: Diagnosis not present

## 2023-01-01 DIAGNOSIS — M86672 Other chronic osteomyelitis, left ankle and foot: Secondary | ICD-10-CM | POA: Diagnosis not present

## 2023-01-01 DIAGNOSIS — E11621 Type 2 diabetes mellitus with foot ulcer: Secondary | ICD-10-CM | POA: Diagnosis not present

## 2023-01-01 LAB — GLUCOSE, CAPILLARY: Glucose-Capillary: 351 mg/dL — ABNORMAL HIGH (ref 70–99)

## 2023-01-01 NOTE — Progress Notes (Signed)
ELUZER, WARES ([id number]) [id number] Page 1 of 2 Visit Report for 01/01/2023 Arrival Information Details Patient Name: Date of Service: [name], [name] 01/01/2023 10:00 A M Medical Record Number: [id number] Patient Account Number: [id number] Date of Birth/Sex: Treating RN: [birth year] (42 y.o. M) Primary Care Kaylon Hitz: [name] Other Clinician: Referring Gaylia Kassel: Treating Damauri Minion/Extender: Greta Doom in Treatment: 13 Visit Information History Since Last Visit Added or deleted any medications: No Patient Arrived: Ambulatory Any new allergies or adverse reactions: No Arrival Time: 10:00 Had a fall or experienced change in No Accompanied By: self activities of daily living that may affect Transfer Assistance: None risk of falls: Patient Identification Verified: Yes Signs or symptoms of abuse/neglect since last visito No Secondary Verification Process Completed: Yes Hospitalized since last visit: No Patient Requires Transmission-Based Precautions: No Implantable device outside of the clinic excluding No Patient Has Alerts: Yes cellular tissue based products placed in the center Patient Alerts: Left ABI 1.03 05/13/22 since last visit: Right ABI .78 05/13/22 Has Dressing in Place as Prescribed: Yes Pain Present Now: No Electronic Signature(s) Signed: 01/01/2023 12:49:29 PM By: Demetria Pore Entered By: Demetria Pore on 01/01/2023 11:09:28 -------------------------------------------------------------------------------- Encounter Discharge Information Details Patient Name: Date of Service: [name] 01/01/2023 10:00 A M Medical Record Number: [id number] Patient Account Number: [id number] Date of Birth/Sex: Treating RN: 04/23/1981 (42 y.o. M) Primary Care Jaray Boliver: [name] Other Clinician: Referring Lemon Whitacre: Treating Eloyce Bultman/Extender: Greta Doom in Treatment: 13 Encounter  Discharge Information Items Discharge Condition: Stable Ambulatory Status: Ambulatory Discharge Destination: Home Transportation: Private Auto Accompanied By: self Schedule Follow-up Appointment: Yes Clinical Summary of Care: [name], [name] [id number] Page 2 of 2 Electronic Signature(s) Signed: 01/01/2023 12:49:29 PM By: Demetria Pore Entered By: Demetria Pore on 01/01/2023 12:48:38 -------------------------------------------------------------------------------- Vitals Details Patient Name: Date of Service: [name] 01/01/2023 10:00 A M Medical Record Number: [id number] Patient Account Number: [id number] Date of Birth/Sex: Treating RN: May 20, 1981 (42 y.o. M) Primary Care Genavieve Mangiapane: [name] Other Clinician: Referring Amado Andal: Treating Grazia Taffe/Extender: Greta Doom in Treatment: 13 Vital Signs Time Taken: 10:05 Temperature (F): 98.0 Height (in): 73 Pulse (bpm): 80 Weight (lbs): 330 Respiratory Rate (breaths/min): 16 Body Mass Index (BMI): 43.5 Blood Pressure (mmHg): 126/80 Capillary Blood Glucose (mg/dl): 244 Reference Range: 80 - 120 mg / dl Airway Pulse Oximetry (%): 99 Electronic Signature(s) Signed: 01/01/2023 12:49:29 PM By: Demetria Pore Entered By: Demetria Pore on 01/01/2023 11:10:13

## 2023-01-01 NOTE — Progress Notes (Signed)
[name], [name] ([id number]) [id number] Page 1 of 2 Visit Report for 01/01/2023 HBO Details Patient Name: Date of Service: [name], [name] 01/01/2023 10:00 A M Medical Record Number: [id number] Patient Account Number: [id number] Date of Birth/Sex: Treating RN: July 19, 1980 (42 y.o. 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 HBO Treatment Details Treatment Number: 2 Patient Type: Outpatient Chamber Type: Monoplace Chamber Serial #: [id number] Treatment Protocol: 2.0 ATA with 90 minutes oxygen, with two 5 minute air breaks Treatment Details Compression Rate Down: 2.0 psi / minute De-Compression Rate Up: 1.0 psi / minute A breaks and breathing ir Compress Tx Pressure periods Decompress Decompress Begins Reached (leave unused spaces Begins Ends blank) Chamber Pressure (ATA 1 2 2 2 2 2  --2 1 ) Clock Time (24 hr) 10:28 10:43 11:13 11:19 11:49 11:55 - - 12:25 12:40 Treatment Length: 132 (minutes) Treatment Segments: 4 Vital Signs Capillary Blood Glucose Reference Range: 80 - 120 mg / dl HBO Diabetic Blood Glucose Intervention Range: <131 mg/dl or >295 mg/dl Time Vitals Blood Respiratory Capillary Blood Glucose Pulse Action Type: Pulse: Temperature: Taken: Pressure: Rate: Glucose (mg/dl): Meter #: Oximetry (%) Taken: Pre 10:05 126/80 80 16 98 351 1 99 non per protocol Post 12:35 128/80 80 16 97.8 303 1 99 non per protocol Treatment Response Treatment Toleration: Well Treatment Completion Status: Treatment Completed without Adverse Event Electronic Signature(s) Signed: 01/01/2023 12:49:29 PM By: Demetria Pore Signed: 01/01/2023 12:52:48 PM By: Allen Derry PA-C Previous Signature: 01/01/2023 12:04:52 PM Version By: Allen Derry PA-C Entered By: Demetria Pore on 01/01/2023 12:48:14 [name] ([id number]) [id number] Page 2 of 2 -------------------------------------------------------------------------------- HBO Safety Checklist Details Patient Name: Date of Service: [name], [name] 01/01/2023 10:00 A M Medical Record Number: [id number] Patient Account Number: [id number] Date of Birth/Sex: Treating RN: [birth year] (42 y.o. M) Primary Care Katherine Syme: [name] Other Clinician: Referring Khrystyna Schwalm: Treating Sabryna Lahm/Extender: Greta Doom in Treatment: 13 HBO Safety Checklist Items Safety Checklist Consent Form Signed Patient voided / foley secured and emptied When did you last eato 01/01/23 Last dose of injectable or oral agent over 10 days Gaynor Ferreras aware Ostomy pouch emptied and vented if applicable NA All implantable devices assessed, documented and approved NA Intravenous access site secured and place NA Valuables secured Linens and cotton and cotton/polyester blend (less than 51% polyester) Personal oil-based products / skin lotions / body lotions removed Wigs or hairpieces removed NA Smoking or tobacco materials removed NA Books / newspapers / magazines / loose paper removed Cologne, aftershave, perfume and deodorant removed Jewelry removed (may wrap wedding band) Make-up removed NA Hair care products removed Battery operated devices (external) removed NA Heating patches and chemical warmers removed NA Titanium eyewear removed NA Nail polish cured greater than 10 hours NA Casting material cured greater than 10 hours NA Hearing aids removed NA Loose dentures or partials removed NA Prosthetics have been removed NA Patient demonstrates correct use of air break device (if applicable) Patient concerns have been  addressed Patient grounding bracelet on and cord attached to chamber Specifics for Inpatients (complete in addition to above) Medication sheet sent with patient NA Intravenous medications needed or due during therapy sent with patient NA Drainage tubes (e.g. nasogastric tube or chest tube secured and vented) NA  Endotracheal or Tracheotomy tube secured NA Cuff deflated of air and inflated with saline NA Airway suctioned NA Electronic Signature(s) Signed: 01/01/2023 12:49:29 PM By: Demetria Pore Entered By: Demetria Pore on 01/01/2023 11:11:16

## 2023-01-04 ENCOUNTER — Encounter: Payer: 59 | Admitting: Physician Assistant

## 2023-01-04 DIAGNOSIS — M86672 Other chronic osteomyelitis, left ankle and foot: Secondary | ICD-10-CM | POA: Diagnosis not present

## 2023-01-04 DIAGNOSIS — E1140 Type 2 diabetes mellitus with diabetic neuropathy, unspecified: Secondary | ICD-10-CM | POA: Diagnosis not present

## 2023-01-04 DIAGNOSIS — E1169 Type 2 diabetes mellitus with other specified complication: Secondary | ICD-10-CM | POA: Diagnosis not present

## 2023-01-04 DIAGNOSIS — E11621 Type 2 diabetes mellitus with foot ulcer: Secondary | ICD-10-CM | POA: Diagnosis not present

## 2023-01-04 DIAGNOSIS — L97522 Non-pressure chronic ulcer of other part of left foot with fat layer exposed: Secondary | ICD-10-CM | POA: Diagnosis not present

## 2023-01-04 LAB — GLUCOSE, CAPILLARY
Glucose-Capillary: 285 mg/dL — ABNORMAL HIGH (ref 70–99)
Glucose-Capillary: 253 mg/dL — ABNORMAL HIGH (ref 70–99)

## 2023-01-05 ENCOUNTER — Encounter: Payer: 59 | Admitting: Physician Assistant

## 2023-01-05 DIAGNOSIS — E1169 Type 2 diabetes mellitus with other specified complication: Secondary | ICD-10-CM | POA: Diagnosis not present

## 2023-01-05 DIAGNOSIS — M86672 Other chronic osteomyelitis, left ankle and foot: Secondary | ICD-10-CM | POA: Diagnosis not present

## 2023-01-05 DIAGNOSIS — L97522 Non-pressure chronic ulcer of other part of left foot with fat layer exposed: Secondary | ICD-10-CM | POA: Diagnosis not present

## 2023-01-05 DIAGNOSIS — E1140 Type 2 diabetes mellitus with diabetic neuropathy, unspecified: Secondary | ICD-10-CM | POA: Diagnosis not present

## 2023-01-05 DIAGNOSIS — E11621 Type 2 diabetes mellitus with foot ulcer: Secondary | ICD-10-CM | POA: Diagnosis not present

## 2023-01-05 LAB — GLUCOSE, CAPILLARY
Glucose-Capillary: 299 mg/dL — ABNORMAL HIGH (ref 70–99)
Glucose-Capillary: 263 mg/dL — ABNORMAL HIGH (ref 70–99)

## 2023-01-05 NOTE — Progress Notes (Signed)
DELBERT, TESTANI ([id number]) [id number] Page 1 of 2 Visit Report for 01/04/2023 Arrival Information Details Patient Name: Date of Service: [name], [name] 01/04/2023 10:00 A M Medical Record Number: [id number] Patient Account Number: [id number] Date of Birth/Sex: Treating RN: May 08, 1981 (42 y.o. M) Primary Care [name]: [name] Other Clinician: Referring [name]: Treating Takari Lundahl/Extender: Greta Doom in Treatment: 13 Visit Information History Since Last Visit Added or deleted any medications: No Patient Arrived: Ambulatory Any new allergies or adverse reactions: No Arrival Time: 10:00 Had a fall or experienced change in No Accompanied By: self activities of daily living that may affect Transfer Assistance: None risk of falls: Patient Requires Transmission-Based Precautions: No Signs or symptoms of abuse/neglect since last visito No Patient Has Alerts: Yes Hospitalized since last visit: No Patient Alerts: Left ABI 1.03 05/13/22 Implantable device outside of the clinic excluding No Right ABI .78 05/13/22 cellular tissue based products placed in the center since last visit: Pain Present Now: No Electronic Signature(s) Signed: 01/05/2023 11:33:54 AM By: Demetria Pore Entered By: Demetria Pore on 01/04/2023 11:53:08 -------------------------------------------------------------------------------- Encounter Discharge Information Details Patient Name: Date of Service: [name] 01/04/2023 10:00 A M Medical Record Number: [id number] Patient Account Number: [id number] Date of Birth/Sex: Treating RN: January 20, 1981 (42 y.o. M) Primary Care [name]: [name] Other Clinician: Referring [name]: Treating Aprel Egelhoff/Extender: Greta Doom in Treatment: 13 Encounter Discharge Information Items Discharge Condition: Stable Ambulatory Status: Ambulatory Discharge Destination:  Home Transportation: Private Auto Accompanied By: self Schedule Follow-up Appointment: Yes Clinical Summary of Care: [name], [name] ([id number]) [id number] Page 2 of 2 Electronic Signature(s) Signed: 01/05/2023 11:33:54 AM By: Demetria Pore Entered By: Demetria Pore on 01/04/2023 12:25:15 -------------------------------------------------------------------------------- Vitals Details Patient Name: Date of Service: [name] 01/04/2023 10:00 A M Medical Record Number: [id number] Patient Account Number: [id number] Date of Birth/Sex: Treating RN: 1980/05/28 (42 y.o. M) Primary Care [name]: [name] Other Clinician: Referring [name]: Treating Shifra Swartzentruber/Extender: Greta Doom in Treatment: 13 Vital Signs Time Taken: 10:05 Temperature (F): 98.0 Height (in): 73 Pulse (bpm): 85 Weight (lbs): 330 Respiratory Rate (breaths/min): 16 Body Mass Index (BMI): 43.5 Blood Pressure (mmHg): 124/80 Capillary Blood Glucose (mg/dl): 956 Reference Range: 80 - 120 mg / dl Airway Pulse Oximetry (%): 99 Electronic Signature(s) Signed: 01/05/2023 11:33:54 AM By: Demetria Pore Entered By: Demetria Pore on 01/04/2023 11:53:15

## 2023-01-05 NOTE — Progress Notes (Signed)
ROYER, MAISANO ([id number]) [id number] Page 1 of 2 Visit Report for 01/05/2023 Arrival Information Details Patient Name: Date of Service: TYVEON, STAUDACHER 01/05/2023 10:00 A M Medical Record Number: [id number] Patient Account Number: [id number] Date of Birth/Sex: Treating RN: 1981/01/08 (42 y.o. M) Primary Care Sekou Zuckerman: [name] Other Clinician: Referring Blythe Hartshorn: Treating Antavius Sperbeck/Extender: Greta Doom in Treatment: 13 Visit Information History Since Last Visit Added or deleted any medications: No Patient Arrived: Ambulatory Any new allergies or adverse reactions: No Arrival Time: 10:00 Had a fall or experienced change in No Accompanied By: self activities of daily living that may affect Transfer Assistance: None risk of falls: Patient Identification Verified: Yes Signs or symptoms of abuse/neglect since last visito No Secondary Verification Process Completed: Yes Hospitalized since last visit: No Patient Requires Transmission-Based Precautions: No Implantable device outside of the clinic excluding No Patient Has Alerts: Yes cellular tissue based products placed in the center Patient Alerts: Left ABI 1.03 05/13/22 since last visit: Right ABI .78 05/13/22 Pain Present Now: No Electronic Signature(s) Signed: 01/05/2023 3:33:15 PM By: Demetria Pore Entered By: Demetria Pore on 01/05/2023 12:18:27 -------------------------------------------------------------------------------- Encounter Discharge Information Details Patient Name: Date of Service: [name] 01/05/2023 10:00 A M Medical Record Number: [id number] Patient Account Number: [id number] Date of Birth/Sex: Treating RN: 1981-04-20 (42 y.o. M) Primary Care Ethel Meisenheimer: [name] Other Clinician: Referring Frankee Gritz: Treating Viana Sleep/Extender: Greta Doom in Treatment: 13 Encounter Discharge Information Items Discharge  Condition: Stable Ambulatory Status: Ambulatory Discharge Destination: Home Transportation: Private Auto Accompanied By: self Schedule Follow-up Appointment: Yes Clinical Summary of Care: MITSUGI, LOWREY ([id number]) [id number] Page 2 of 2 Electronic Signature(s) Signed: 01/05/2023 3:33:15 PM By: Demetria Pore Entered By: Demetria Pore on 01/05/2023 12:37:53 -------------------------------------------------------------------------------- Vitals Details Patient Name: Date of Service: [name] 01/05/2023 10:00 A M Medical Record Number: [id number] Patient Account Number: [id number] Date of Birth/Sex: Treating RN: 1981/01/11 (42 y.o. M) Primary Care Tayia Stonesifer: [name] Other Clinician: Referring Lailah Marcelli: Treating Lynora Dymond/Extender: Greta Doom in Treatment: 13 Vital Signs Time Taken: 10:00 Temperature (F): 98.0 Height (in): 73 Pulse (bpm): 80 Weight (lbs): 330 Respiratory Rate (breaths/min): 16 Body Mass Index (BMI): 43.5 Blood Pressure (mmHg): 122/80 Capillary Blood Glucose (mg/dl): 951 Reference Range: 80 - 120 mg / dl Airway Pulse Oximetry (%): 99 Electronic Signature(s) Signed: 01/05/2023 3:33:15 PM By: Demetria Pore Entered By: Demetria Pore on 01/05/2023 12:18:29

## 2023-01-05 NOTE — Progress Notes (Signed)
[name], [name] ([id number]) [id number] Page 1 of 2 Visit Report for 01/05/2023 HBO Details Patient Name: Date of Service: [name], [name] 01/05/2023 10:00 A M Medical Record Number: [id number] Patient Account Number: [id number] Date of Birth/Sex: Treating RN: 1980/10/10 (42 y.o. 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 HBO Treatment Details Treatment Number: 4 Patient Type: Outpatient Chamber Type: Monoplace Chamber Serial #: [id number] Treatment Protocol: 2.0 ATA with 90 minutes oxygen, with two 5 minute air breaks Treatment Details Compression Rate Down: 2.0 psi / minute De-Compression Rate Up: 1.5 psi / minute A breaks and breathing ir Compress Tx Pressure periods Decompress Decompress Begins Reached (leave unused spaces Begins Ends blank) Chamber Pressure (ATA 1 2 2 2 2 2  --2 1 ) Clock Time (24 hr) 10:24 10:36 11:07 11:12 11:42 11:48 - - 12:18 12:29 Treatment Length: 125 (minutes) Treatment Segments: 4 Vital Signs Capillary Blood Glucose Reference Range: 80 - 120 mg / dl HBO Diabetic Blood Glucose Intervention Range: <131 mg/dl or >413 mg/dl Time Vitals Blood Respiratory Capillary Blood Glucose Pulse Action Type: Pulse: Temperature: Taken: Pressure: Rate: Glucose (mg/dl): Meter #: Oximetry (%) Taken: Pre 10:00 122/80 80 16 98 299 1 99 non per protocol Post 12:29 118/80 80 16 97.8 273 1 non per protocol Treatment Response Treatment Toleration: Well Treatment Completion Status: Treatment Completed without Adverse Event Electronic Signature(s) Signed: 01/05/2023 3:33:15 PM By: Demetria Pore Signed: 01/05/2023 4:30:30 PM By: Allen Derry PA-C Entered By: Demetria Pore on 01/05/2023 12:37:03 [name] ([id number]) [id number] Page 2 of 2 -------------------------------------------------------------------------------- HBO Safety Checklist Details Patient Name: Date of Service: [name], [name] 01/05/2023 10:00 A M Medical Record Number: [id number] Patient Account Number: [id number] Date of Birth/Sex: Treating RN: Aug 12, 1980 (42 y.o. M) Primary Care Michah Minton: [name] Other Clinician: Referring Ithan Touhey: Treating Ziyon Cedotal/Extender: Greta Doom in Treatment: 13 HBO Safety Checklist Items Safety Checklist Consent Form Signed Patient voided / foley secured and emptied When did you last eato 01/05/23 Last dose of injectable or oral agent more than 10 days ago Aryannah Mohon aware Ostomy pouch emptied and vented if applicable NA All implantable devices assessed, documented and approved NA Intravenous access site secured and place NA Valuables secured Linens and cotton and cotton/polyester blend (less than 51% polyester) Personal oil-based products / skin lotions / body lotions removed Wigs or hairpieces removed NA Smoking or tobacco materials removed NA Books / newspapers / magazines / loose paper removed Cologne, aftershave, perfume and deodorant removed Jewelry removed (may wrap wedding band) Make-up removed NA Hair care products removed Battery operated devices (external) removed NA Heating patches and chemical warmers removed NA Titanium eyewear removed NA Nail polish cured greater than 10 hours NA Casting material cured greater than 10 hours NA Hearing aids removed NA Loose dentures or partials removed NA Prosthetics have been removed NA Patient demonstrates correct use of air break device (if applicable) Patient concerns have been addressed Patient grounding bracelet on and cord attached to  chamber Specifics for Inpatients (complete in addition to above) Medication sheet sent with patient NA Intravenous medications needed or due during therapy sent with patient NA Drainage tubes (e.g. nasogastric tube or chest tube secured and vented) NA Endotracheal or Tracheotomy tube secured NA Cuff deflated of  air and inflated with saline NA Airway suctioned NA Electronic Signature(s) Signed: 01/05/2023 3:33:15 PM By: Demetria Pore Entered By: Demetria Pore on 01/05/2023 12:18:32

## 2023-01-05 NOTE — Progress Notes (Signed)
[name], [name] ([id number]) [id number] Page 1 of 2 Visit Report for 01/04/2023 HBO Details Patient Name: Date of Service: [name], [name] 01/04/2023 10:00 A M Medical Record Number: [id number] Patient Account Number: [id number] Date of Birth/Sex: Treating RN: March 28, 1981 (42 y.o. 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 HBO Treatment Details Treatment Number: 3 Patient Type: Outpatient Chamber Type: Monoplace Chamber Serial #: [id number] Treatment Protocol: 2.0 ATA with 90 minutes oxygen, with two 5 minute air breaks Treatment Details Compression Rate Down: 2.0 psi / minute De-Compression Rate Up: 1.5 psi / minute A breaks and breathing ir Compress Tx Pressure periods Decompress Decompress Begins Reached (leave unused spaces Begins Ends blank) Chamber Pressure (ATA 1 2 2 2 2 2  --2 1 ) Clock Time (24 hr) 10:13 10:27 10:57 11:02 11:33 11:38 - - 12:08 12:18 Treatment Length: 125 (minutes) Treatment Segments: 4 Vital Signs Capillary Blood Glucose Reference Range: 80 - 120 mg / dl HBO Diabetic Blood Glucose Intervention Range: <131 mg/dl or >629 mg/dl Time Vitals Blood Respiratory Capillary Blood Glucose Pulse Action Type: Pulse: Temperature: Taken: Pressure: Rate: Glucose (mg/dl): Meter #: Oximetry (%) Taken: Pre 10:05 124/80 85 16 98 285 1 99 non per protocol Post 12:18 122/80 84 16 97.8 253 1 non per protocol Treatment Response Treatment Toleration: Well Treatment Completion Status: Treatment Completed without Adverse Event Electronic Signature(s) Signed: 01/05/2023 11:33:54 AM By: Demetria Pore Signed: 01/05/2023 4:30:30 PM By: Allen Derry PA-C Entered By: Demetria Pore on 01/04/2023 12:24:45 [name] ([id number]) [id number] Page 2 of 2 -------------------------------------------------------------------------------- HBO Safety Checklist Details Patient Name: Date of Service: [name], [name] 01/04/2023 10:00 A M Medical Record Number: [id number] Patient Account Number: [id number] Date of Birth/Sex: Treating RN: [birth year] (42 y.o. M) Primary Care Keirston Saephanh: [name] Other Clinician: Referring Donzel [name]: Treating Maicie Vanderloop/Extender: Greta Doom in Treatment: 13 HBO Safety Checklist Items Safety Checklist Consent Form Signed Patient voided / foley secured and emptied When did you last eato 01/03/23 Last dose of injectable or oral agent over 10 days Carter Kaman aware Ostomy pouch emptied and vented if applicable NA All implantable devices assessed, documented and approved NA Intravenous access site secured and place NA Valuables secured Linens and cotton and cotton/polyester blend (less than 51% polyester) Personal oil-based products / skin lotions / body lotions removed Wigs or hairpieces removed NA Smoking or tobacco materials removed NA Books / newspapers / magazines / loose paper removed Cologne, aftershave, perfume and deodorant removed Jewelry removed (may wrap wedding band) Make-up removed NA Hair care products removed Battery operated devices (external) removed NA Heating patches and chemical warmers removed NA Titanium eyewear removed NA Nail polish cured greater than 10 hours NA Casting material cured greater than 10 hours NA Hearing aids removed NA Loose dentures or partials removed NA Prosthetics have been removed NA Patient demonstrates correct use of air break device (if applicable) Patient concerns have been addressed Patient grounding bracelet on and cord attached to  chamber Specifics for Inpatients (complete in addition to above) Medication sheet sent with patient NA Intravenous medications needed or due during therapy sent with patient NA Drainage tubes (e.g. nasogastric tube or chest tube secured and vented) NA Endotracheal or Tracheotomy tube secured NA Cuff deflated of air and  inflated with saline NA Airway suctioned NA Electronic Signature(s) Signed: 01/05/2023 11:33:54 AM By: Demetria Pore Entered By: Demetria Pore on 01/04/2023 11:53:21

## 2023-01-06 ENCOUNTER — Encounter: Payer: 59 | Admitting: Internal Medicine

## 2023-01-06 DIAGNOSIS — L97521 Non-pressure chronic ulcer of other part of left foot limited to breakdown of skin: Secondary | ICD-10-CM | POA: Diagnosis not present

## 2023-01-06 DIAGNOSIS — E11621 Type 2 diabetes mellitus with foot ulcer: Secondary | ICD-10-CM | POA: Diagnosis not present

## 2023-01-06 DIAGNOSIS — E1169 Type 2 diabetes mellitus with other specified complication: Secondary | ICD-10-CM | POA: Diagnosis not present

## 2023-01-06 DIAGNOSIS — L97522 Non-pressure chronic ulcer of other part of left foot with fat layer exposed: Secondary | ICD-10-CM | POA: Diagnosis not present

## 2023-01-06 DIAGNOSIS — E1140 Type 2 diabetes mellitus with diabetic neuropathy, unspecified: Secondary | ICD-10-CM | POA: Diagnosis not present

## 2023-01-06 DIAGNOSIS — M86672 Other chronic osteomyelitis, left ankle and foot: Secondary | ICD-10-CM | POA: Diagnosis not present

## 2023-01-06 LAB — GLUCOSE, CAPILLARY
Glucose-Capillary: 298 mg/dL — ABNORMAL HIGH (ref 70–99)
Glucose-Capillary: 248 mg/dL — ABNORMAL HIGH (ref 70–99)

## 2023-01-06 NOTE — Progress Notes (Signed)
[name] ([id number]) [id number] Page 1 of 2 Visit Report for 01/06/2023 HBO Details Patient Name: Date of Service: [name], [name] 01/06/2023 10:00 A M Medical Record Number: [id number] Patient Account Number: [id number] Date of Birth/Sex: Treating RN: 1980-12-19 (42 y.o. 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 HBO Treatment Details Treatment Number: 5 Patient Type: Outpatient Chamber Type: Monoplace Chamber Serial #: [id number] Treatment Protocol: 2.0 ATA with 90 minutes oxygen, with two 5 minute air breaks Treatment Details Compression Rate Down: 2.0 psi / minute De-Compression Rate Up: 1.5 psi / minute A breaks and breathing ir Compress Tx Pressure periods Decompress Decompress Begins Reached (leave unused spaces Begins Ends blank) Chamber Pressure (ATA 1 2 2 2 2 2  --2 1 ) Clock Time (24 hr) 10:13 10:24 10:54 11:00 11:30 11:35 - - 12:05 12:15 Treatment Length: 122 (minutes) Treatment Segments: 4 Vital Signs Capillary Blood Glucose Reference Range: 80 - 120 mg / dl HBO Diabetic Blood Glucose Intervention Range: <131 mg/dl or >846 mg/dl Time Vitals Blood Respiratory Capillary Blood Glucose Pulse Action Type: Pulse: Temperature: Taken: Pressure: Rate: Glucose (mg/dl): Meter #: Oximetry (%) Taken: Pre 10:00 122/80 98 16 98 298 1 100 non per protocol Post 12:15 118/80 100 16 97.9 248 1 1 non per protocol Treatment Response Treatment Toleration: Well Treatment Completion Status: Treatment Completed without Adverse Event [name] Notes No concerns with treatment given. Patient  was also seen for wound care evaluation HBO Attestation I certify that I supervised this HBO treatment in accordance with Medicare guidelines. A trained emergency response team is readily available per Yes hospital policies and procedures. Continue HBOT as ordered. Yes Electronic Signature(s) Signed: 01/06/2023 4:20:15 PM By: [name] Najjar MD Previous Signature: 01/06/2023 3:22:20 PM Version By: Demetria Pore Entered By: [name] on 01/06/2023 16:18:41 [name] ([id number]) [id number] Page 2 of 2 -------------------------------------------------------------------------------- HBO Safety Checklist Details Patient Name: Date of Service: [name], [name] 01/06/2023 10:00 A M Medical Record Number: [id number] Patient Account Number: [id number] Date of Birth/Sex: Treating RN: [birth year] (42 y.o. M) Primary Care [name]: [name] Other Clinician: Referring [name]: Treating [name]/Extender: [name], [name] in Treatment: 14 HBO Safety Checklist Items Safety Checklist Consent Form Signed Patient voided / foley secured and emptied When did you last eato 01/05/23 Last dose of injectable or oral agent Ostomy pouch emptied and vented if applicable NA All implantable devices assessed, documented and approved NA Intravenous access site secured and place NA Valuables secured Linens and cotton and cotton/polyester blend (less than 51% polyester) Personal oil-based products / skin lotions / body lotions removed Wigs or hairpieces removed NA Smoking or tobacco materials removed NA Books / newspapers / magazines / loose paper removed Cologne, aftershave, perfume and deodorant removed Jewelry removed (may wrap wedding band) Make-up removed NA Hair care products removed Battery operated devices (external) removed NA Heating patches and chemical warmers removed NA Titanium eyewear removed NA Nail polish  cured greater than 10 hours NA Casting material cured greater than 10 hours NA Hearing aids removed NA Loose dentures or partials removed NA Prosthetics have been removed NA Patient demonstrates correct use of air break device (if applicable) Patient concerns have been addressed Patient grounding  bracelet on and cord attached to chamber Specifics for Inpatients (complete in addition to above) Medication sheet sent with patient NA Intravenous medications needed or due during therapy sent with patient NA Drainage tubes (e.g. nasogastric tube or chest tube secured and vented) NA Endotracheal or Tracheotomy tube secured NA Cuff deflated of air and inflated with saline NA Airway suctioned NA Electronic Signature(s) Signed: 01/06/2023 3:22:20 PM By: Demetria Pore Entered By: Demetria Pore on 01/06/2023 12:06:33

## 2023-01-06 NOTE — Progress Notes (Signed)
CASSANDRA, ONDERDONK ([id number]) [id number] Page 1 of 9 Visit Report for 12/30/2022 Arrival Information Details Patient Name: Date of Service: [name], [name] 12/30/2022 8:15 A M Medical Record Number: [id number] Patient Account Number: [id number] Date of Birth/Sex: Treating RN: 10-Oct-1980 (42 y.o. [name] Primary Care Sharetta Ricchio: [name] Other Clinician: Betha Loa Referring Tomoki Lucken: Treating Leily Capek/Extender: [name] in Treatment: 13 Visit Information History Since Last Visit All ordered tests and consults were completed: No Patient Arrived: Ambulatory Added or deleted any medications: No Arrival Time: 08:24 Any new allergies or adverse reactions: No Transfer Assistance: None Had a fall or experienced change in No Patient Identification Verified: Yes activities of daily living that may affect Secondary Verification Process Completed: Yes risk of falls: Patient Requires Transmission-Based Precautions: No Signs or symptoms of abuse/neglect since No Patient Has Alerts: Yes last visito Patient Alerts: Left ABI 1.03 05/13/22 Hospitalized since last visit: No Right ABI .78 05/13/22 Implantable device outside of the clinic No excluding cellular tissue based products placed in the center since last visit: Has Dressing in Place as Prescribed: Yes Has Footwear/Offloading in Place as Yes Prescribed: Left: Surgical Shoe with Pressure Relief Insole Pain Present Now: No Electronic Signature(s) Signed: 12/30/2022 5:47:33 PM By: Betha Loa Entered By: Betha Loa on 12/30/2022 08:24:55 -------------------------------------------------------------------------------- Clinic Level of Care Assessment Details Patient Name: Date of Service: [name], [name] 12/30/2022 8:15 A M Medical Record Number: [id number] Patient Account Number: [id number] Date of Birth/Sex: Treating RN: November 28, 1980 (42 y.o. [name] Primary Care Sieanna Vanstone: [name] Other Clinician: Betha Loa Referring Abdullah Rizzi: Treating Shawnelle Spoerl/Extender: [name] in Treatment: 13 Clinic Level of Care Assessment Items TOOL 1 Quantity Score [name], [name] ([id number]) [phone].pdf Page 2 of 9 []  - 0 Use when EandM and Procedure is performed on INITIAL visit ASSESSMENTS - Nursing Assessment / Reassessment []  - 0 General Physical Exam (combine w/ comprehensive assessment (listed just below) when performed on new pt. evals) []  - 0 Comprehensive Assessment (HX, ROS, Risk Assessments, Wounds Hx, etc.) ASSESSMENTS - Wound and Skin Assessment / Reassessment []  - 0 Dermatologic / Skin Assessment (not related to wound area) ASSESSMENTS - Ostomy and/or Continence Assessment and Care []  - 0 Incontinence Assessment and Management []  - 0 Ostomy Care Assessment and Management (repouching, etc.) PROCESS - Coordination of Care []  - 0 Simple Patient / Family Education for ongoing care []  - 0 Complex (extensive) Patient / Family Education for ongoing care []  - 0 Staff obtains Chiropractor, Records, T Results / Process Orders est []  - 0 Staff telephones HHA, Nursing Homes / Clarify orders / etc []  - 0 Routine Transfer to another Facility (non-emergent condition) []  - 0 Routine Hospital Admission (non-emergent condition) []  - 0 New Admissions / Manufacturing engineer / Ordering NPWT Apligraf, etc. , []  - 0 Emergency Hospital Admission (emergent condition) PROCESS - Special Needs []  - 0 Pediatric / Minor Patient Management []  - 0 Isolation Patient Management []  - 0 Hearing / Language / Visual special needs []  - 0 Assessment of Community assistance (transportation, D/[name] planning, etc.) []  - 0 Additional assistance / Altered mentation []  - 0 Support Surface(s) Assessment (bed, cushion, seat, etc.) INTERVENTIONS - Miscellaneous []  - 0 External ear  exam []  - 0 Patient Transfer (multiple staff / Nurse, adult / Similar devices) []  - 0 Simple Staple / Suture removal (25 or less) []  - 0 Complex Staple / Suture removal (26 or more) []  - 0 Hypo/Hyperglycemic Management (do  not check if billed separately) []  - 0 Ankle / Brachial Index (ABI) - do not check if billed separately Has the patient been seen at the hospital within the last three years: Yes Total Score: 0 Level Of Care: ____ Electronic Signature(s) Signed: 12/30/2022 5:47:33 PM By: Betha Loa Entered By: Betha Loa on 12/30/2022 08:55:31 Encounter Discharge Information Details -------------------------------------------------------------------------------- [name] ([id number]) [id number] Page 3 of 9 Patient Name: Date of Service: [name], [name] 12/30/2022 8:15 A M Medical Record Number: [id number] Patient Account Number: [id number] Date of Birth/Sex: Treating RN: Jun 03, 1980 (42 y.o. [name] Primary Care Estel Tonelli: [name] Other Clinician: Betha Loa Referring Vinita Prentiss: Treating Jadene Stemmer/Extender: [name] in Treatment: 13 Encounter Discharge Information Items Post Procedure Vitals Discharge Condition: Stable Temperature (F): 98.2 Ambulatory Status: Ambulatory Pulse (bpm): 86 Discharge Destination: Home Respiratory Rate (breaths/min): 16 Transportation: Private Auto Blood Pressure (mmHg): 143/91 Accompanied By: self Schedule Follow-up Appointment: Yes Clinical Summary of Care: Electronic Signature(s) Signed: 12/30/2022 5:47:33 PM By: Betha Loa Entered By: Betha Loa on 12/30/2022 09:06:56 -------------------------------------------------------------------------------- Lower Extremity Assessment Details Patient Name: Date of Service: [name], [name] 12/30/2022 8:15 A M Medical Record Number: [id number] Patient Account Number: [id number] Date of Birth/Sex:  Treating RN: April 29, 1981 (42 y.o. [name] Primary Care Corran Lalone: [name] Other Clinician: Betha Loa Referring Thedore Pickel: Treating Birl Lobello/Extender: [name] in Treatment: 13 Vascular Assessment Pulses: Dorsalis Pedis Palpable: [Left:Yes] Toe Nail Assessment Left: Right: Thick: No Discolored: No Deformed: No Improper Length and Hygiene: No Electronic Signature(s) Signed: 12/30/2022 5:47:33 PM By: Betha Loa Signed: 01/06/2023 2:28:30 PM By: Elliot Gurney, BSN, RN, CWS, Kim RN, BSN Entered By: Betha Loa on 12/30/2022 08:36:30 [name] ([id number]) [id number] Page 4 of 9 -------------------------------------------------------------------------------- Multi Wound Chart Details Patient Name: Date of Service: [name], [name] 12/30/2022 8:15 A M Medical Record Number: [id number] Patient Account Number: [id number] Date of Birth/Sex: Treating RN: 05/17/1981 (42 y.o. [name] Primary Care Alecxander Mainwaring: [name] Other Clinician: Betha Loa Referring Robbie Rideaux: Treating Humaira Sculley/Extender: [name] in Treatment: 13 Vital Signs Height(in): 73 Pulse(bpm): 86 Weight(lbs): 330 Blood Pressure(mmHg): 143/91 Body Mass Index(BMI): 43.5 Temperature(F): 98.2 Respiratory Rate(breaths/min): 16 [10:Photos:] [N/A:N/A] Left Amputation Site - Toe Left, Lateral Metatarsal head fifth N/A Wound Location: Shear/Friction Blister N/A Wounding Event: Diabetic Wound/Ulcer of the Lower Diabetic Wound/Ulcer of the Lower N/A Primary Etiology: Extremity Extremity Hypertension, Peripheral Venous Hypertension, Peripheral Venous N/A Comorbid History: Disease, Type II Diabetes, Disease, Type II Diabetes, Osteomyelitis Osteomyelitis 11/18/2022 10/07/2022 N/A Date Acquired: 6 10 N/A Weeks of Treatment: Open Open N/A Wound Status: No No N/A Wound Recurrence: 0.3x0.4x0.2  0.7x0.8x0.7 N/A Measurements L x W x D (cm) 0.094 0.44 N/A A (cm) : rea 0.019 0.308 N/A Volume (cm) : 70.10% 89.80% N/A % Reduction in A rea: 79.80% 64.40% N/A % Reduction in Volume: 6 Starting Position 1 (o'clock): 3 Ending Position 1 (o'clock): 1.6 Maximum Distance 1 (cm): N/A Yes N/A Undermining: Grade 1 Grade 3 N/A Classification: Small Medium N/A Exudate A mount: Serous Serosanguineous N/A Exudate Type: amber red, brown N/A Exudate Color: No Yes N/A Foul Odor A Cleansing: fter N/A No N/A Odor A nticipated Due to Product Use: N/A Flat and Intact N/A Wound Margin: Large (67-100%) None Present (0%) N/A Granulation A mount: Red, Pink N/A N/A Granulation Quality: None Present (0%) Large (67-100%) N/A Necrotic A mount: Fat Layer (Subcutaneous Tissue): Yes Fat Layer (Subcutaneous Tissue): Yes N/A Exposed Structures: Fascia: No Tendon: Yes  Tendon: No Fascia: No Muscle: No Muscle: No Joint: No Joint: No Bone: No Bone: No None None N/A Epithelialization: Treatment Notes [name], [name] ([id number]) [id number] Page 5 of 9 Electronic Signature(s) Signed: 12/30/2022 5:47:33 PM By: Betha Loa Entered By: Betha Loa on 12/30/2022 08:36:39 -------------------------------------------------------------------------------- Multi-Disciplinary Care Plan Details Patient Name: Date of Service: [name], [name] 12/30/2022 8:15 A M Medical Record Number: [id number] Patient Account Number: [id number] Date of Birth/Sex: Treating RN: 1980/07/05 (42 y.o. Loel Lofty, Selena Batten Primary Care Danni Leabo: [name] Other Clinician: Betha Loa Referring Myrth Dahan: Treating Broughton Eppinger/Extender: [name] in Treatment: 13 Active Inactive Necrotic Tissue Nursing Diagnoses: Knowledge deficit related to management of necrotic/devitalized tissue Goals: Necrotic/devitalized tissue will be minimized in the wound  bed Date Initiated: 09/30/2022 Target Resolution Date: 01/15/2023 Goal Status: Active Interventions: Assess patient pain level pre-, during and post procedure and prior to discharge Notes: Wound/Skin Impairment Nursing Diagnoses: Knowledge deficit related to ulceration/compromised skin integrity Goals: Patient/caregiver will verbalize understanding of skin care regimen Date Initiated: 09/30/2022 Date Inactivated: 12/02/2022 Target Resolution Date: 10/31/2022 Goal Status: Met Ulcer/skin breakdown will have a volume reduction of 30% by week 4 Date Initiated: 09/30/2022 Date Inactivated: 12/02/2022 Target Resolution Date: 10/31/2022 Goal Status: Unmet Unmet Reason: continue care Ulcer/skin breakdown will have a volume reduction of 50% by week 8 Date Initiated: 09/30/2022 Date Inactivated: 12/09/2022 Target Resolution Date: 11/30/2022 Goal Status: Met Ulcer/skin breakdown will have a volume reduction of 80% by week 12 Date Initiated: 09/30/2022 Target Resolution Date: 12/31/2022 Goal Status: Active Ulcer/skin breakdown will heal within 14 weeks Date Initiated: 09/30/2022 Target Resolution Date: 01/31/2023 Goal Status: Active Interventions: Assess patient/caregiver ability to obtain necessary supplies Assess patient/caregiver ability to perform ulcer/skin care regimen upon admission and as needed Assess ulceration(s) every visit Notes: [name], [name] ([id number]) [id number] Page 6 of 9 Electronic Signature(s) Signed: 12/30/2022 5:47:33 PM By: Betha Loa Signed: 01/06/2023 2:28:30 PM By: Elliot Gurney, BSN, RN, CWS, Kim RN, BSN Entered By: Betha Loa on 12/30/2022 09:05:41 -------------------------------------------------------------------------------- Pain Assessment Details Patient Name: Date of Service: [name] 12/30/2022 8:15 A M Medical Record Number: [id number] Patient Account Number: [id number] Date of Birth/Sex: Treating RN: June 24, 1980 (42 y.o. [name] Primary Care Samreen Seltzer: [name] Other Clinician: Betha Loa Referring Ginnette Gates: Treating Matilde Pottenger/Extender: [name] in Treatment: 13 Active Problems Location of Pain Severity and Description of Pain Patient Has Paino No Site Locations Pain Management and Medication Current Pain Management: Electronic Signature(s) Signed: 12/30/2022 5:47:33 PM By: Betha Loa Signed: 01/06/2023 2:28:30 PM By: Elliot Gurney, BSN, RN, CWS, Kim RN, BSN Entered By: Betha Loa on 12/30/2022 08:27:07 Patient/Caregiver Education Details -------------------------------------------------------------------------------- [name] ([id number]) [id number] Page 7 of 9 Patient Name: Date of Service: [name], [name] 8/7/2024andnbsp8:15 A M Medical Record Number: [id number] Patient Account Number: [id number] Date of Birth/Gender: Treating RN: May 10, 1981 (42 y.o. [name] Primary Care Physician: [name] Other Clinician: Betha Loa Referring Physician: Treating Physician/Extender: [name] in Treatment: 13 Education Assessment Education Provided To: Patient Education Topics Provided Wound/Skin Impairment: Handouts: Other: continue wound care as directed Methods: Explain/Verbal Responses: State content correctly Electronic Signature(s) Signed: 12/30/2022 5:47:33 PM By: Betha Loa Entered By: Betha Loa on 12/30/2022 09:06:02 -------------------------------------------------------------------------------- Wound Assessment Details Patient Name: Date of Service: [name] 12/30/2022 8:15 A M Medical Record Number: [id number] Patient Account Number: [id number] Date of Birth/Sex: Treating RN: Dec 16, 1980 (42 y.o. [name] Primary Care Naiyana Barbian: [name] Other Clinician: Betha Loa Referring  Azarya Oconnell: Treating Shaketha Jeon/Extender: [name] in Treatment: 13 Wound Status Wound Number: 10 Primary Diabetic Wound/Ulcer of the Lower Extremity Etiology: Wound Location: Left Amputation Site - Toe Wound Status: Open Wounding Event: Shear/Friction Comorbid Hypertension, Peripheral Venous Disease, Type II Diabetes, Date Acquired: 11/18/2022 History: Osteomyelitis Weeks Of Treatment: 6 Clustered Wound: No Photos Wound Measurements Length: (cm) 0.3 Width: (cm) 0.4 Depth: (cm) 0.2 [name], [name] ([id number]) Area: (cm) 0.094 Volume: (cm) 0.019 % Reduction in Area: 70.1% % Reduction in Volume: 79.8% Epithelialization: None [id number] Page 8 of 9 Wound Description Classification: Grade 1 Exudate Amount: Small Exudate Type: Serous Exudate Color: amber Foul Odor After Cleansing: No Slough/Fibrino No Wound Bed Granulation Amount: Large (67-100%) Exposed Structure Granulation Quality: Red, Pink Fascia Exposed: No Necrotic Amount: None Present (0%) Fat Layer (Subcutaneous Tissue) Exposed: Yes Tendon Exposed: No Muscle Exposed: No Joint Exposed: No Bone Exposed: No Electronic Signature(s) Signed: 12/30/2022 5:47:33 PM By: Betha Loa Signed: 01/06/2023 2:28:30 PM By: Elliot Gurney, BSN, RN, CWS, Kim RN, BSN Entered By: Betha Loa on 12/30/2022 08:35:09 -------------------------------------------------------------------------------- Wound Assessment Details Patient Name: Date of Service: [name] 12/30/2022 8:15 A M Medical Record Number: [id number] Patient Account Number: [id number] Date of Birth/Sex: Treating RN: Sep 01, 1980 (42 y.o. [name] Primary Care Kayo Zion: [name] Other Clinician: Betha Loa Referring Hessie Varone: Treating Latressa Harries/Extender: [name] in Treatment: 13 Wound Status Wound Number: 9 Primary Diabetic Wound/Ulcer of the Lower Extremity Etiology: Wound Location: Left, Lateral  Metatarsal head fifth Wound Status: Open Wounding Event: Blister Comorbid Hypertension, Peripheral Venous Disease, Type II Diabetes, Date Acquired: 10/07/2022 History: Osteomyelitis Weeks Of Treatment: 10 Clustered Wound: No Photos Wound Measurements Length: (cm) 0.7 Width: (cm) 0.8 Depth: (cm) 0.7 Area: (cm) 0. Volume: (cm) 0. [name], ARREOLA ([id number]) % Reduction in Area: 89.8% % Reduction in Volume: 64.4% Epithelialization: None 44 Undermining: Yes 308 Starting Position (o'clock): 6 [id number] Page 9 of 9 Ending Position (o'clock): 3 Maximum Distance: (cm) 1.6 Wound Description Classification: Grade 3 Wound Margin: Flat and Intact Exudate Amount: Medium Exudate Type: Serosanguineous Exudate Color: red, brown Foul Odor After Cleansing: Yes Due to Product Use: No Slough/Fibrino Yes Wound Bed Granulation Amount: None Present (0%) Exposed Structure Necrotic Amount: Large (67-100%) Fascia Exposed: No Necrotic Quality: Adherent Slough Fat Layer (Subcutaneous Tissue) Exposed: Yes Tendon Exposed: Yes Muscle Exposed: No Joint Exposed: No Bone Exposed: No Electronic Signature(s) Signed: 12/30/2022 5:47:33 PM By: Betha Loa Signed: 01/06/2023 2:28:30 PM By: Elliot Gurney, BSN, RN, CWS, Kim RN, BSN Entered By: Betha Loa on 12/30/2022 08:36:04 -------------------------------------------------------------------------------- Vitals Details Patient Name: Date of Service: [name] 12/30/2022 8:15 A M Medical Record Number: [id number] Patient Account Number: [id number] Date of Birth/Sex: Treating RN: 1981/03/12 (42 y.o. Loel Lofty, Selena Batten Primary Care Naziya Hegwood: [name] Other Clinician: Betha Loa Referring Lucero Ide: Treating Tida Saner/Extender: [name] in Treatment: 13 Vital Signs Time Taken: 08:25 Temperature (F): 98.2 Height (in): 73 Pulse (bpm): 86 Weight (lbs): 330 Respiratory Rate  (breaths/min): 16 Body Mass Index (BMI): 43.5 Blood Pressure (mmHg): 143/91 Reference Range: 80 - 120 mg / dl Electronic Signature(s) Signed: 12/30/2022 5:47:33 PM By: Betha Loa Entered By: Betha Loa on 12/30/2022 08:27:02

## 2023-01-06 NOTE — Progress Notes (Signed)
[name], [name] ([id number]) [id number] Page 1 of 2 Visit Report for 01/06/2023 Arrival Information Details Patient Name: Date of Service: [name], [name] 01/06/2023 10:00 A M Medical Record Number: [id number] Patient Account Number: [id number] Date of Birth/Sex: Treating RN: [birth year] (42 y.o. M) Primary Care Princesa Willig: [name] Other Clinician: Referring Waverly Tarquinio: Treating Glyn Zendejas/Extender: [name], [name] in Treatment: 14 Visit Information History Since Last Visit Added or deleted any medications: No Patient Arrived: Ambulatory Any new allergies or adverse reactions: No Arrival Time: 10:00 Had a fall or experienced change in No Accompanied By: self activities of daily living that may affect Transfer Assistance: None risk of falls: Patient Identification Verified: Yes Signs or symptoms of abuse/neglect since last visito No Secondary Verification Process Completed: Yes Hospitalized since last visit: No Patient Requires Transmission-Based Precautions: No Implantable device outside of the clinic excluding No Patient Has Alerts: Yes cellular tissue based products placed in the center Patient Alerts: Left ABI 1.03 05/13/22 since last visit: Right ABI .78 05/13/22 Pain Present Now: No Electronic Signature(s) Signed: 01/06/2023 3:22:20 PM By: Demetria Pore Entered By: Demetria Pore on 01/06/2023 12:06:23 -------------------------------------------------------------------------------- Encounter Discharge Information Details Patient Name: Date of Service: [name] 01/06/2023 10:00 A M Medical Record Number: [id number] Patient Account Number: [id number] Date of Birth/Sex: Treating RN: [birth year] (42 y.o. M) Primary Care Thersa Mohiuddin: [name] Other Clinician: Referring Janny Crute: Treating Yuki Brunsman/Extender: [name], [name] in Treatment: 14 Encounter Discharge Information  Items Discharge Condition: Stable Ambulatory Status: Ambulatory Discharge Destination: Home Transportation: Private Auto Accompanied By: self Schedule Follow-up Appointment: Yes Clinical Summary of Care: [name], [name] [id number] Page 2 of 2 Electronic Signature(s) Signed: 01/06/2023 3:22:20 PM By: Demetria Pore Entered By: Demetria Pore on 01/06/2023 12:27:35 -------------------------------------------------------------------------------- Vitals Details Patient Name: Date of Service: [name] 01/06/2023 10:00 A M Medical Record Number: [id number] Patient Account Number: [id number] Date of Birth/Sex: Treating RN: Oct 15, 1980 (42 y.o. M) Primary Care Flara Storti: [name] Other Clinician: Referring Pete Merten: Treating Marcelline Temkin/Extender: [name], [name] in Treatment: 14 Vital Signs Time Taken: 10:00 Temperature (F): 98.0 Height (in): 73 Pulse (bpm): 98 Weight (lbs): 330 Respiratory Rate (breaths/min): 16 Body Mass Index (BMI): 43.5 Blood Pressure (mmHg): 122/80 Capillary Blood Glucose (mg/dl): 244 Reference Range: 80 - 120 mg / dl Airway Pulse Oximetry (%): 100 Electronic Signature(s) Signed: 01/06/2023 3:22:20 PM By: Demetria Pore Entered By: Demetria Pore on 01/06/2023 12:06:30

## 2023-01-07 ENCOUNTER — Encounter: Payer: 59 | Admitting: Physician Assistant

## 2023-01-07 NOTE — Progress Notes (Signed)
NEREO, SHOBERT ([id number]) [id number] Page 1 of 10 Visit Report for 01/06/2023 Arrival Information Details Patient Name: Date of Service: [name], [name] 01/06/2023 1:00 PM Medical Record Number: [id number] Patient Account Number: [id number] Date of Birth/Sex: Treating RN: 01-25-1981 (42 y.o. [name] Primary Care [name]: [name] Other Clinician: Referring [name]: Treating [name]/Extender: [name], [name] [name] in Treatment: 14 Visit Information History Since Last Visit Added or deleted any medications: No Patient Arrived: Ambulatory Any new allergies or adverse reactions: No Arrival Time: 12:26 Had a fall or experienced change in No Accompanied By: self activities of daily living that may affect Transfer Assistance: None risk of falls: Patient Identification Verified: Yes Hospitalized since last visit: No Secondary Verification Process Completed: Yes Has Dressing in Place as Prescribed: Yes Patient Requires Transmission-Based Precautions: No Pain Present Now: No Patient Has Alerts: Yes Patient Alerts: Left ABI 1.03 05/13/22 Right ABI .78 05/13/22 Electronic Signature(s) Signed: 01/06/2023 4:42:29 PM By: Angelina Pih Entered By: Angelina Pih on 01/06/2023 12:31:40 -------------------------------------------------------------------------------- Clinic Level of Care Assessment Details Patient Name: Date of Service: [name], [name] 01/06/2023 1:00 PM Medical Record Number: [id number] Patient Account Number: [id number] Date of Birth/Sex: Treating RN: 1981-04-28 (42 y.o. [name] Primary Care [name]: [name] Other Clinician: Referring [name]: Treating [name]/Extender: RO BSO [name], [name] [name] in Treatment: 14 Clinic Level of Care Assessment Items TOOL 1 Quantity Score []  - 0 Use when EandM and Procedure is performed on INITIAL  visit ASSESSMENTS - Nursing Assessment / Reassessment []  - 0 General Physical Exam (combine w/ comprehensive assessment (listed just below) when performed on new pt. evals) []  - 0 Comprehensive Assessment (HX, ROS, Risk Assessments, Wounds Hx, etc.) ASSESSMENTS - Wound and Skin Assessment / Reassessment []  - 0 Dermatologic / Skin Assessment (not related to wound area) [name] ([id number]) [id number] Page 2 of 10 ASSESSMENTS - Ostomy and/or Continence Assessment and Care []  - 0 Incontinence Assessment and Management []  - 0 Ostomy Care Assessment and Management (repouching, etc.) PROCESS - Coordination of Care []  - 0 Simple Patient / Family Education for ongoing care []  - 0 Complex (extensive) Patient / Family Education for ongoing care []  - 0 Staff obtains Chiropractor, Records, T Results / Process Orders est []  - 0 Staff telephones HHA, Nursing Homes / Clarify orders / etc []  - 0 Routine Transfer to another Facility (non-emergent condition) []  - 0 Routine Hospital Admission (non-emergent condition) []  - 0 New Admissions / Manufacturing engineer / Ordering NPWT Apligraf, etc. , []  - 0 Emergency Hospital Admission (emergent condition) PROCESS - Special Needs []  - 0 Pediatric / Minor Patient Management []  - 0 Isolation Patient Management []  - 0 Hearing / Language / Visual special needs []  - 0 Assessment of Community assistance (transportation, D/[name] planning, etc.) []  - 0 Additional assistance / Altered mentation []  - 0 Support Surface(s) Assessment (bed, cushion, seat, etc.) INTERVENTIONS - Miscellaneous []  - 0 External ear exam []  - 0 Patient Transfer (multiple staff / Nurse, adult / Similar devices) []  - 0 Simple Staple / Suture removal (25 or less) []  - 0 Complex Staple / Suture removal (26 or more) []  - 0 Hypo/Hyperglycemic Management (do not check if billed separately) []  - 0 Ankle / Brachial Index (ABI) - do not check if  billed separately Has the patient been seen at the hospital within the last three years: Yes Total Score: 0 Level Of Care: ____ Electronic Signature(s) Signed: 01/06/2023  4:42:29 PM By: Angelina Pih Entered By: Angelina Pih on 01/06/2023 12:57:55 -------------------------------------------------------------------------------- Encounter Discharge Information Details Patient Name: Date of Service: [name], [name] 01/06/2023 1:00 PM Medical Record Number: [id number] Patient Account Number: [id number] Date of Birth/Sex: Treating RN: 09/03/1980 (42 y.o. [name] Primary Care [name]: [name] Other Clinician: Referring Adanya Sosinski: Treating [name]/Extender: RO BSO [name], [name] [name] in Treatment: [id number] Encounter Discharge Information Items Post Procedure [name] ([id number]) [phone].pdf Page 3 of 10 Discharge Condition: Stable Temperature (F): 97.9 Ambulatory Status: Ambulatory Pulse (bpm): 100 Discharge Destination: Home Respiratory Rate (breaths/min): 18 Transportation: Private Auto Blood Pressure (mmHg): 118/80 Accompanied By: self Schedule Follow-up Appointment: Yes Clinical Summary of Care: Electronic Signature(s) Signed: 01/06/2023 1:31:20 PM By: Angelina Pih Entered By: Angelina Pih on 01/06/2023 13:31:19 -------------------------------------------------------------------------------- Lower Extremity Assessment Details Patient Name: Date of Service: [name], [name] 01/06/2023 1:00 PM Medical Record Number: [id number] Patient Account Number: [id number] Date of Birth/Sex: Treating RN: [birth year] (42 y.o. [name] Primary Care Chaylee Ehrsam: [name] Other Clinician: Referring Tanuj Mullens: Treating [name]/Extender: [name], [name] [name], [name] in Treatment: 14 Edema Assessment Assessed: [Left: No] [Right: No] Edema: [Left: N] [Right: o] Calf Left:  Right: Point of Measurement: 40 cm From Medial Instep 44 cm Ankle Left: Right: Point of Measurement: 12 cm From Medial Instep 27 cm Vascular Assessment Pulses: Dorsalis Pedis Palpable: [Left:Yes] Posterior Tibial Palpable: [Left:Yes] Extremity colors, hair growth, and conditions: Extremity Color: [Left:Normal] Hair Growth on Extremity: [Left:Yes] Temperature of Extremity: [Left:Warm < 3 seconds] Toe Nail Assessment Left: Right: Thick: No Discolored: No Deformed: No Improper Length and Hygiene: No Electronic Signature(s) Signed: 01/06/2023 4:42:29 PM By: Angelina Pih Entered By: Angelina Pih on 01/06/2023 12:40:03 [name] ([id number]) [id number] Page 4 of 10 -------------------------------------------------------------------------------- Multi Wound Chart Details Patient Name: Date of Service: [name], [name] 01/06/2023 1:00 PM Medical Record Number: [id number] Patient Account Number: [id number] Date of Birth/Sex: Treating RN: 1980-12-04 (42 y.o. [name] Primary Care Jasiah Elsen: [name] Other Clinician: Referring Narayan Scull: Treating Caoilainn Sacks/Extender: [name], [name] [name], [name] in Treatment: 14 Vital Signs Height(in): 73 Pulse(bpm): 100 Weight(lbs): 330 Blood Pressure(mmHg): 118/80 Body Mass Index(BMI): 43.5 Temperature(F): 97.9 Respiratory Rate(breaths/min): 18 [10:Photos:] [N/A:N/A] Left Amputation Site - Toe Left, Lateral Metatarsal head fifth N/A Wound Location: Shear/Friction Blister N/A Wounding Event: Diabetic Wound/Ulcer of the Lower Diabetic Wound/Ulcer of the Lower N/A Primary Etiology: Extremity Extremity Hypertension, Peripheral Venous Hypertension, Peripheral Venous N/A Comorbid History: Disease, Type II Diabetes, Disease, Type II Diabetes, Osteomyelitis Osteomyelitis 11/18/2022 10/07/2022 N/A Date Acquired: 7 11 N/A [name] of Treatment: Open Open N/A Wound  Status: No No N/A Wound Recurrence: 0.4x0.4x0.1 0.7x0.6x0.5 N/A Measurements L x W x D (cm) 0.126 0.33 N/A A (cm) : rea 0.013 0.165 N/A Volume (cm) : 59.90% 92.40% N/A % Reduction in A rea: 86.20% 80.90% N/A % Reduction in Volume: Grade 1 Grade 3 N/A Classification: Medium Medium N/A Exudate A mount: Serosanguineous Serosanguineous N/A Exudate Type: red, brown red, brown N/A Exudate Color: N/A Flat and Intact N/A Wound Margin: Large (67-100%) Medium (34-66%) N/A Granulation A mount: Red, Pink Red, Pink N/A Granulation Quality: Small (1-33%) Medium (34-66%) N/A Necrotic A mount: Fat Layer (Subcutaneous Tissue): Yes Fat Layer (Subcutaneous Tissue): Yes N/A Exposed Structures: Fascia: No Tendon: Yes Tendon: No Fascia: No Muscle: No Muscle: No Joint: No Joint: No Bone: No Bone: No None None N/A Epithelialization: Treatment Notes Electronic Signature(s) Signed: 01/06/2023 4:42:29 PM  By: Roger Kill, Sherian Rein ([id number]) [id number] Page 5 of 10 Entered By: Angelina Pih on 01/06/2023 12:51:57 -------------------------------------------------------------------------------- Multi-Disciplinary Care Plan Details Patient Name: Date of Service: [name], [name] 01/06/2023 1:00 PM Medical Record Number: [id number] Patient Account Number: [id number] Date of Birth/Sex: Treating RN: 23-Sep-1980 (42 y.o. [name] Primary Care Inna Tisdell: [name] Other Clinician: Referring Amanuel Sinkfield: Treating Haleema Vanderheyden/Extender: [name], [name] [name] in Treatment: 14 Active Inactive Necrotic Tissue Nursing Diagnoses: Knowledge deficit related to management of necrotic/devitalized tissue Goals: Necrotic/devitalized tissue will be minimized in the wound bed Date Initiated: 09/30/2022 Target Resolution Date: 01/15/2023 Goal Status: Active Interventions: Assess patient pain level pre-, during and post  procedure and prior to discharge Notes: Wound/Skin Impairment Nursing Diagnoses: Knowledge deficit related to ulceration/compromised skin integrity Goals: Patient/caregiver will verbalize understanding of skin care regimen Date Initiated: 09/30/2022 Date Inactivated: 12/02/2022 Target Resolution Date: 10/31/2022 Goal Status: Met Ulcer/skin breakdown will have a volume reduction of 30% by week 4 Date Initiated: 09/30/2022 Date Inactivated: 12/02/2022 Target Resolution Date: 10/31/2022 Goal Status: Unmet Unmet Reason: continue care Ulcer/skin breakdown will have a volume reduction of 50% by week 8 Date Initiated: 09/30/2022 Date Inactivated: 12/09/2022 Target Resolution Date: 11/30/2022 Goal Status: Met Ulcer/skin breakdown will have a volume reduction of 80% by week 12 Date Initiated: 09/30/2022 Target Resolution Date: 12/31/2022 Goal Status: Active Ulcer/skin breakdown will heal within 14 [name] Date Initiated: 09/30/2022 Target Resolution Date: 01/31/2023 Goal Status: Active Interventions: Assess patient/caregiver ability to obtain necessary supplies Assess patient/caregiver ability to perform ulcer/skin care regimen upon admission and as needed Assess ulceration(s) every visit Notes: Electronic Signature(s) Signed: 01/06/2023 1:29:47 PM By: Angelina Pih Entered By: Angelina Pih on 01/06/2023 13:29:47 [name] ([id number]) [id number] Page 6 of 10 -------------------------------------------------------------------------------- Pain Assessment Details Patient Name: Date of Service: KACIE, KRISTIANSEN 01/06/2023 1:00 PM Medical Record Number: [id number] Patient Account Number: [id number] Date of Birth/Sex: Treating RN: 1981-01-21 (42 y.o. [name] Primary Care Laith Antonelli: [name] Other Clinician: Referring Sherell Christoffel: Treating Priya Matsen/Extender: [name], [name] [name] in Treatment: 14 Active Problems Location of  Pain Severity and Description of Pain Patient Has Paino No Site Locations Rate the pain. Current Pain Level: 0 Pain Management and Medication Current Pain Management: Electronic Signature(s) Signed: 01/06/2023 4:42:29 PM By: Angelina Pih Entered By: Angelina Pih on 01/06/2023 12:32:11 -------------------------------------------------------------------------------- Patient/Caregiver Education Details Patient Name: Date of Service: [name], [name]. 8/14/2024andnbsp1:00 PM Medical Record Number: [id number] Patient Account Number: [id number] Date of Birth/Gender: Treating RN: 03/20/1981 (42 y.o. [name] Primary Care Physician: [name] Other Clinician: Referring Physician: Treating Physician/Extender: RO BSO [name], [name] [name] in Treatment: [address] [place] [name] ([id number]) [id number] Page 7 of 10 Education Assessment Education Provided To: Patient Education Topics Provided Wound/Skin Impairment: Handouts: Caring for Your Ulcer Methods: Explain/Verbal Responses: State content correctly Electronic Signature(s) Signed: 01/06/2023 4:42:29 PM By: Angelina Pih Entered By: Angelina Pih on 01/06/2023 13:29:57 -------------------------------------------------------------------------------- Wound Assessment Details Patient Name: Date of Service: [name], [name] 01/06/2023 1:00 PM Medical Record Number: [id number] Patient Account Number: [id number] Date of Birth/Sex: Treating RN: Apr 29, 1981 (42 y.o. [name] Primary Care Aleesia Henney: [name] Other Clinician: Referring Abraham Entwistle: Treating Dmarcus Decicco/Extender: [name], [name] [name], [name] in Treatment: 14 Wound Status Wound Number: 10 Primary Diabetic Wound/Ulcer of the Lower Extremity Etiology: Wound Location: Left Amputation Site - Toe Wound Status: Open Wounding Event: Shear/Friction Comorbid Hypertension, Peripheral  Venous Disease,  Type II Diabetes, Date Acquired: 11/18/2022 History: Osteomyelitis [name] Of Treatment: 7 Clustered Wound: No Photos Wound Measurements Length: (cm) 0.4 Width: (cm) 0.4 Depth: (cm) 0.1 Area: (cm) 0.126 Volume: (cm) 0.013 % Reduction in Area: 59.9% % Reduction in Volume: 86.2% Epithelialization: None Tunneling: No Undermining: No Wound Description Classification: Grade 1 Exudate Amount: Medium [name], [name] ([id number]) Exudate Type: Serosanguineous Exudate Color: red, brown Foul Odor After Cleansing: No Slough/Fibrino Yes [id number] Page 8 of 10 Wound Bed Granulation Amount: Large (67-100%) Exposed Structure Granulation Quality: Red, Pink Fascia Exposed: No Necrotic Amount: Small (1-33%) Fat Layer (Subcutaneous Tissue) Exposed: Yes Necrotic Quality: Adherent Slough Tendon Exposed: No Muscle Exposed: No Joint Exposed: No Bone Exposed: No Treatment Notes Wound #10 (Amputation Site - Toe) Wound Laterality: Left Cleanser Soap and Water Discharge Instruction: Gently cleanse wound with antibacterial soap, rinse and pat dry prior to dressing wounds Peri-Wound Care Topical Primary Dressing Hydrofera [name] Ready Transfer Foam, 4x5 (in/in) Discharge Instruction: Apply Hydrofera [name] Ready to wound bed as directed Secondary Dressing Kerlix 4.5 x 4.1 (in/yd) Discharge Instruction: Apply Kerlix 4.5 x 4.1 (in/yd) as instructed Secured With Medipore T - 36M Medipore H Soft Cloth Surgical T ape ape, 2x2 (in/yd) Compression Wrap Compression Stockings Add-Ons Electronic Signature(s) Signed: 01/06/2023 4:42:29 PM By: Angelina Pih Entered By: Angelina Pih on 01/06/2023 12:38:49 -------------------------------------------------------------------------------- Wound Assessment Details Patient Name: Date of Service: [name], [name] 01/06/2023 1:00 PM Medical Record Number: [id number] Patient Account Number: [id number] Date  of Birth/Sex: Treating RN: July 01, 1980 (42 y.o. [name] Primary Care Nysir Fergusson: [name] Other Clinician: Referring Kelly Eisler: Treating Augustine Brannick/Extender: [name], [name] [name], [name] in Treatment: 14 Wound Status Wound Number: 9 Primary Diabetic Wound/Ulcer of the Lower Extremity Etiology: Wound Location: Left, Lateral Metatarsal head fifth Wound Status: Open Wounding Event: Blister Comorbid Hypertension, Peripheral Venous Disease, Type II Diabetes, Date Acquired: 10/07/2022 History: Osteomyelitis [name] Of Treatment: 11 Clustered Wound: No [name], [name] ([id number]) [id number] Page 9 of 10 Photos Wound Measurements Length: (cm) 0.7 Width: (cm) 0.6 Depth: (cm) 0.5 Area: (cm) 0.33 Volume: (cm) 0.165 % Reduction in Area: 92.4% % Reduction in Volume: 80.9% Epithelialization: None Tunneling: No Undermining: Yes Starting Position (o'clock): 6 Ending Position (o'clock): 12 Maximum Distance: (cm) 0.3 Wound Description Classification: Grade 3 Wound Margin: Flat and Intact Exudate Amount: Medium Exudate Type: Serosanguineous Exudate Color: red, brown Foul Odor After Cleansing: No Slough/Fibrino Yes Wound Bed Granulation Amount: Medium (34-66%) Exposed Structure Granulation Quality: Red, Pink Fascia Exposed: No Necrotic Amount: Medium (34-66%) Fat Layer (Subcutaneous Tissue) Exposed: Yes Necrotic Quality: Adherent Slough Tendon Exposed: Yes Muscle Exposed: No Joint Exposed: No Bone Exposed: No Treatment Notes Wound #9 (Metatarsal head fifth) Wound Laterality: Left, Lateral Cleanser Soap and Water Discharge Instruction: Gently cleanse wound with antibacterial soap, rinse and pat dry prior to dressing wounds Peri-Wound Care Topical vashe Discharge Instruction: damp to dry daily Primary Dressing Gauze Discharge Instruction: moisten with Vashe and apply to wound Secondary Dressing Kerlix 4.5 x 4.1  (in/yd) Discharge Instruction: Apply Kerlix 4.5 x 4.1 (in/yd) as instructed Secured With Medipore T - 36M Medipore H Soft Cloth Surgical T ape ape, 2x2 (in/yd) Compression Wrap Compression Stockings Add-Ons Electronic Signature(s) Signed: 01/06/2023 1:25:38 PM By: Roger Kill, Sherian Rein ([id number]) [id number] Page 10 of 10 Entered By: Angelina Pih on 01/06/2023 13:25:37 -------------------------------------------------------------------------------- Vitals Details Patient Name: Date of Service: [name], [name] 01/06/2023 1:00 PM Medical Record Number: [id number] Patient Account Number: [id number] Date of Birth/Sex: Treating RN:  January 07, 1981 (41 y.o. [name] Primary Care Taesha Goodell: [name] Other Clinician: Referring Jagar Lua: Treating Noreen Mackintosh/Extender: [name], [name] [name] in Treatment: 14 Vital Signs Time Taken: 12:30 Temperature (F): 97.9 Height (in): 73 Pulse (bpm): 100 Weight (lbs): 330 Respiratory Rate (breaths/min): 18 Body Mass Index (BMI): 43.5 Blood Pressure (mmHg): 118/80 Reference Range: 80 - 120 mg / dl Electronic Signature(s) Signed: 01/06/2023 4:42:29 PM By: Angelina Pih Entered By: Angelina Pih on 01/06/2023 12:32:04

## 2023-01-08 ENCOUNTER — Encounter: Payer: 59 | Admitting: Physician Assistant

## 2023-01-08 DIAGNOSIS — M86672 Other chronic osteomyelitis, left ankle and foot: Secondary | ICD-10-CM | POA: Diagnosis not present

## 2023-01-08 DIAGNOSIS — E1169 Type 2 diabetes mellitus with other specified complication: Secondary | ICD-10-CM | POA: Diagnosis not present

## 2023-01-08 DIAGNOSIS — E11621 Type 2 diabetes mellitus with foot ulcer: Secondary | ICD-10-CM | POA: Diagnosis not present

## 2023-01-08 DIAGNOSIS — E1140 Type 2 diabetes mellitus with diabetic neuropathy, unspecified: Secondary | ICD-10-CM | POA: Diagnosis not present

## 2023-01-08 DIAGNOSIS — L97522 Non-pressure chronic ulcer of other part of left foot with fat layer exposed: Secondary | ICD-10-CM | POA: Diagnosis not present

## 2023-01-08 LAB — GLUCOSE, CAPILLARY
Glucose-Capillary: 348 mg/dL — ABNORMAL HIGH (ref 70–99)
Glucose-Capillary: 222 mg/dL — ABNORMAL HIGH (ref 70–99)

## 2023-01-08 NOTE — Progress Notes (Signed)
JABRAYLON, TKACZYK ([id number]) [id number] Page 1 of 2 Visit Report for 01/08/2023 Arrival Information Details Patient Name: Date of Service: [name], [name] 01/08/2023 10:00 A M Medical Record Number: [id number] Patient Account Number: [id number] Date of Birth/Sex: Treating RN: 1980/08/31 (42 y.o. M) Primary Care Jermichael Belmares: [name] Other Clinician: Referring Mysha Peeler: Treating Donis Pinder/Extender: Greta Doom in Treatment: 14 Visit Information History Since Last Visit Added or deleted any medications: No Patient Arrived: Ambulatory Any new allergies or adverse reactions: No Arrival Time: 10:00 Had a fall or experienced change in No Accompanied By: self activities of daily living that may affect Transfer Assistance: None risk of falls: Patient Identification Verified: Yes Signs or symptoms of abuse/neglect since last visito No Secondary Verification Process Completed: Yes Hospitalized since last visit: No Patient Requires Transmission-Based Precautions: No Implantable device outside of the clinic excluding No Patient Has Alerts: Yes cellular tissue based products placed in the center Patient Alerts: Left ABI 1.03 05/13/22 since last visit: Right ABI .78 05/13/22 Pain Present Now: No Electronic Signature(s) Signed: 01/08/2023 12:36:00 PM By: Demetria Pore Entered By: Demetria Pore on 01/08/2023 12:11:58 -------------------------------------------------------------------------------- Encounter Discharge Information Details Patient Name: Date of Service: [name] 01/08/2023 10:00 A M Medical Record Number: [id number] Patient Account Number: [id number] Date of Birth/Sex: Treating RN: September 14, 1980 (42 y.o. M) Primary Care Rodger Giangregorio: [name] Other Clinician: Referring Meghen Akopyan: Treating Baylin Cabal/Extender: Greta Doom in Treatment: 14 Encounter Discharge Information Items Discharge  Condition: Stable Ambulatory Status: Ambulatory Discharge Destination: Home Transportation: Private Auto Accompanied By: self Schedule Follow-up Appointment: Yes Clinical Summary of Care: [name], [name] [id number] Page 2 of 2 Electronic Signature(s) Signed: 01/08/2023 12:36:00 PM By: Demetria Pore Entered By: Demetria Pore on 01/08/2023 12:34:47 -------------------------------------------------------------------------------- Vitals Details Patient Name: Date of Service: [name] 01/08/2023 10:00 A M Medical Record Number: [id number] Patient Account Number: [id number] Date of Birth/Sex: Treating RN: 12/22/1980 (42 y.o. M) Primary Care Moody Robben: [name] Other Clinician: Referring Jagger Demonte: Treating Domanique Luckett/Extender: Greta Doom in Treatment: 14 Vital Signs Time Taken: 10:00 Temperature (F): 98.0 Height (in): 73 Pulse (bpm): 90 Weight (lbs): 330 Respiratory Rate (breaths/min): 16 Body Mass Index (BMI): 43.5 Blood Pressure (mmHg): 140/80 Capillary Blood Glucose (mg/dl): 244 Reference Range: 80 - 120 mg / dl Airway Pulse Oximetry (%): 99 Electronic Signature(s) Signed: 01/08/2023 12:36:00 PM By: Demetria Pore Entered By: Demetria Pore on 01/08/2023 12:12:01

## 2023-01-08 NOTE — Progress Notes (Signed)
ARAGON, GULDEN ([id number]) [id number] Page 1 of 2 Visit Report for 01/08/2023 HBO Details Patient Name: Date of Service: [name], [name] 01/08/2023 10:00 A M Medical Record Number: [id number] Patient Account Number: [id number] Date of Birth/Sex: Treating RN: [birth year] (42 y.o. 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 HBO Treatment Details Treatment Number: 6 Patient Type: Outpatient Chamber Type: Monoplace Chamber Serial #: [id number] Treatment Protocol: 2.0 ATA with 90 minutes oxygen, with two 5 minute air breaks Treatment Details Compression Rate Down: 2.0 psi / minute De-Compression Rate Up: 1.5 psi / minute A breaks and breathing ir Compress Tx Pressure periods Decompress Decompress Begins Reached (leave unused spaces Begins Ends blank) Chamber Pressure (ATA 1 2 2 2 2 2  --2 1 ) Clock Time (24 hr) 10:11 10:22 10:52 10:57 11:28 11:33 - - 12:03 12:12 Treatment Length: 121 (minutes) Treatment Segments: 4 Vital Signs Capillary Blood Glucose Reference Range: 80 - 120 mg / dl HBO Diabetic Blood Glucose Intervention Range: <131 mg/dl or >259 mg/dl Time Vitals Blood Respiratory Capillary Blood Glucose Pulse Action Type: Pulse: Temperature: Taken: Pressure: Rate: Glucose (mg/dl): Meter #: Oximetry (%) Taken: Pre 10:00 140/80 90 16 98 348 1 99 non per protocol Post 12:13 136/80 87 16 97.9 222 1 Treatment Response Treatment Toleration: Well Treatment Completion Status: Treatment Completed without Adverse Event Electronic Signature(s) Signed: 01/08/2023 12:36:00 PM By: Demetria Pore Signed:  01/08/2023 3:00:55 PM By: Allen Derry PA-C Entered By: Demetria Pore on 01/08/2023 12:35:35 [name] ([id number]) [id number] Page 2 of 2 -------------------------------------------------------------------------------- HBO Safety Checklist Details Patient Name: Date of Service: [name], [name] 01/08/2023 10:00 A M Medical Record Number: [id number] Patient Account Number: [id number] Date of Birth/Sex: Treating RN: 04-13-1981 (42 y.o. M) Primary Care Ashby Leflore: [name] Other Clinician: Referring Christhoper Busbee: Treating Dorcus Riga/Extender: Greta Doom in Treatment: 14 HBO Safety Checklist Items Safety Checklist Consent Form Signed Patient voided / foley secured and emptied When did you last eato 01/08/23 Last dose of injectable or oral agent 01/08/23 Ostomy pouch emptied and vented if applicable NA All implantable devices assessed, documented and approved NA Intravenous access site secured and place NA Valuables secured Linens and cotton and cotton/polyester blend (less than 51% polyester) Personal oil-based products / skin lotions / body lotions removed Wigs or hairpieces removed NA Smoking or tobacco materials removed NA Books / newspapers / magazines / loose paper removed Cologne, aftershave, perfume and deodorant removed Jewelry removed (may wrap wedding band) Make-up removed NA Hair care products removed Battery operated devices (external) removed NA Heating patches and chemical warmers removed NA Titanium eyewear removed NA Nail polish cured greater than 10 hours NA Casting material cured greater than 10 hours NA Hearing aids removed NA Loose dentures or partials removed NA Prosthetics have been removed NA Patient demonstrates correct use of air break device (if applicable) Patient concerns have been addressed Patient grounding bracelet on and cord attached to chamber Specifics for Inpatients  (complete in addition to above) Medication sheet sent with patient NA Intravenous medications needed or due during therapy sent with patient NA Drainage tubes (e.g. nasogastric tube or chest tube secured and vented) NA Endotracheal or Tracheotomy tube secured NA Cuff deflated of air and inflated with saline NA Airway suctioned NA  Electronic Signature(s) Signed: 01/08/2023 12:36:00 PM By: Demetria Pore Entered By: Demetria Pore on 01/08/2023 12:12:04

## 2023-01-11 ENCOUNTER — Encounter: Payer: 59 | Admitting: Physician Assistant

## 2023-01-11 DIAGNOSIS — E11621 Type 2 diabetes mellitus with foot ulcer: Secondary | ICD-10-CM | POA: Diagnosis not present

## 2023-01-11 DIAGNOSIS — L97522 Non-pressure chronic ulcer of other part of left foot with fat layer exposed: Secondary | ICD-10-CM | POA: Diagnosis not present

## 2023-01-11 DIAGNOSIS — M86672 Other chronic osteomyelitis, left ankle and foot: Secondary | ICD-10-CM | POA: Diagnosis not present

## 2023-01-11 DIAGNOSIS — E1140 Type 2 diabetes mellitus with diabetic neuropathy, unspecified: Secondary | ICD-10-CM | POA: Diagnosis not present

## 2023-01-11 DIAGNOSIS — E1169 Type 2 diabetes mellitus with other specified complication: Secondary | ICD-10-CM | POA: Diagnosis not present

## 2023-01-11 LAB — GLUCOSE, CAPILLARY
Glucose-Capillary: 271 mg/dL — ABNORMAL HIGH (ref 70–99)
Glucose-Capillary: 209 mg/dL — ABNORMAL HIGH (ref 70–99)

## 2023-01-11 NOTE — Progress Notes (Signed)
DARTH, CRISMON ([id number]) [id number] Page 1 of 2 Visit Report for 01/11/2023 Arrival Information Details Patient Name: Date of Service: [name], [name] 01/11/2023 10:00 A M Medical Record Number: [id number] Patient Account Number: [id number] Date of Birth/Sex: Treating RN: [birth year] (42 y.o. M) Primary Care Iraida Cragin: [name] Other Clinician: Referring Caleigha Zale: Treating Carime Dinkel/Extender: Greta Doom in Treatment: 14 Visit Information History Since Last Visit Added or deleted any medications: No Patient Arrived: Ambulatory Any new allergies or adverse reactions: No Arrival Time: 10:00 Had a fall or experienced change in No Accompanied By: self activities of daily living that may affect Transfer Assistance: None risk of falls: Patient Identification Verified: Yes Signs or symptoms of abuse/neglect since last visito No Secondary Verification Process Completed: Yes Hospitalized since last visit: No Patient Requires Transmission-Based Precautions: No Implantable device outside of the clinic excluding No Patient Has Alerts: Yes cellular tissue based products placed in the center Patient Alerts: Left ABI 1.03 05/13/22 since last visit: Right ABI .78 05/13/22 Pain Present Now: No Electronic Signature(s) Signed: 01/11/2023 3:23:12 PM By: Demetria Pore Entered By: Demetria Pore on 01/11/2023 12:31:57 -------------------------------------------------------------------------------- Encounter Discharge Information Details Patient Name: Date of Service: [name] 01/11/2023 10:00 A M Medical Record Number: [id number] Patient Account Number: [id number] Date of Birth/Sex: Treating RN: February 17, 1981 (42 y.o. M) Primary Care Amaury Kuzel: [name] Other Clinician: Referring Joann Kulpa: Treating Kollin Udell/Extender: Greta Doom in Treatment: 14 Encounter Discharge Information Items Discharge  Condition: Stable Ambulatory Status: Ambulatory Discharge Destination: Home Transportation: Private Auto Accompanied By: self Schedule Follow-up Appointment: Yes Clinical Summary of Care: PERVIS, SCHUENEMAN ([id number]) [id number] Page 2 of 2 Electronic Signature(s) Signed: 01/11/2023 3:23:12 PM By: Demetria Pore Entered By: Demetria Pore on 01/11/2023 12:57:11 -------------------------------------------------------------------------------- Vitals Details Patient Name: Date of Service: [name] 01/11/2023 10:00 A M Medical Record Number: [id number] Patient Account Number: [id number] Date of Birth/Sex: Treating RN: December 20, 1980 (42 y.o. M) Primary Care Jeriah Corkum: [name] Other Clinician: Referring Cashae Weich: Treating Rhet Rorke/Extender: Greta Doom in Treatment: 14 Vital Signs Time Taken: 10:00 Temperature (F): 98.0 Height (in): 73 Pulse (bpm): 107 Weight (lbs): 330 Respiratory Rate (breaths/min): 16 Body Mass Index (BMI): 43.5 Blood Pressure (mmHg): 134/80 Capillary Blood Glucose (mg/dl): 093 Reference Range: 80 - 120 mg / dl Airway Pulse Oximetry (%): 98 Electronic Signature(s) Signed: 01/11/2023 3:23:12 PM By: Demetria Pore Entered By: Demetria Pore on 01/11/2023 12:32:00

## 2023-01-11 NOTE — Progress Notes (Signed)
NICKALIS, WARRELL ([id number]) [id number] Page 1 of 2 Visit Report for 01/11/2023 HBO Details Patient Name: Date of Service: [name], [name] 01/11/2023 10:00 A M Medical Record Number: [id number] Patient Account Number: [id number] Date of Birth/Sex: Treating RN: [birth year] (42 y.o. 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 HBO Treatment Details Treatment Number: 7 Patient Type: Outpatient Chamber Type: Monoplace Chamber Serial #: [id number] Treatment Protocol: 2.0 ATA with 90 minutes oxygen, with two 5 minute air breaks Treatment Details Compression Rate Down: 2.0 psi / minute De-Compression Rate Up: 1.5 psi / minute A breaks and breathing ir Compress Tx Pressure periods Decompress Decompress Begins Reached (leave unused spaces Begins Ends blank) Chamber Pressure (ATA 1 2 2 2 2 2  --2 1 ) Clock Time (24 hr) 10:30 10:42 11:11 11:17 11:47 11:52 - - 12:15 12:25 Treatment Length: 115 (minutes) Treatment Segments: 4 Vital Signs Capillary Blood Glucose Reference Range: 80 - 120 mg / dl HBO Diabetic Blood Glucose Intervention Range: <131 mg/dl or >782 mg/dl Time Vitals Blood Respiratory Capillary Blood Glucose Pulse Action Type: Pulse: Temperature: Taken: Pressure: Rate: Glucose (mg/dl): Meter #: Oximetry (%) Taken: Pre 10:00 134/80 107 16 98 271 1 98 non per prototcol Post 12:25 426/80 109 16 97.8 209 1 99 non per protocol Treatment Response Treatment Toleration: Well Treatment Completion Status: Treatment Completed without Adverse Event Electronic Signature(s) Signed: 01/11/2023 3:23:12 PM By: Demetria Pore Signed: 01/11/2023 5:35:17 PM By: Allen Derry PA-C Entered By: Demetria Pore on 01/11/2023 09:56:42 [name] ([id number]) [id number] Page 2 of 2 -------------------------------------------------------------------------------- HBO Safety Checklist Details Patient Name: Date of Service: [name], [name] 01/11/2023 10:00 A M Medical Record Number: [id number] Patient Account Number: [id number] Date of Birth/Sex: Treating RN: 01/29/1981 (42 y.o. M) Primary Care Jasper Ruminski: [name] Other Clinician: Referring Emiliya Chretien: Treating Sameka Bagent/Extender: Greta Doom in Treatment: 14 HBO Safety Checklist Items Safety Checklist Consent Form Signed Patient voided / foley secured and emptied When did you last eato 01/11/23 Last dose of injectable or oral agent 01/11/23 Ostomy pouch emptied and vented if applicable NA All implantable devices assessed, documented and approved NA Intravenous access site secured and place NA Valuables secured Linens and cotton and cotton/polyester blend (less than 51% polyester) Personal oil-based products / skin lotions / body lotions removed Wigs or hairpieces removed NA Smoking or tobacco materials removed NA Books / newspapers / magazines / loose paper removed Cologne, aftershave, perfume and deodorant removed Jewelry removed (may wrap wedding band) Make-up removed NA Hair care products removed Battery operated devices (external) removed NA Heating patches and chemical warmers removed NA Titanium eyewear removed NA Nail polish cured greater than 10 hours NA Casting material cured greater than 10 hours NA Hearing aids removed NA Loose dentures or partials removed NA Prosthetics have been removed NA Patient demonstrates correct use of air break device (if applicable) Patient concerns have been addressed Patient grounding bracelet on and cord attached to chamber Specifics for  Inpatients (complete in addition to above) Medication sheet sent with patient NA Intravenous medications needed or due during therapy sent with patient NA Drainage tubes (e.g. nasogastric tube or chest tube secured and vented) NA Endotracheal or Tracheotomy tube secured NA Cuff deflated of air and inflated with saline  NA Airway suctioned NA Electronic Signature(s) Signed: 01/11/2023 3:23:12 PM By: Demetria Pore Entered By: Demetria Pore on 01/11/2023 09:32:03

## 2023-01-12 ENCOUNTER — Encounter: Payer: 59 | Admitting: Physician Assistant

## 2023-01-12 DIAGNOSIS — E1140 Type 2 diabetes mellitus with diabetic neuropathy, unspecified: Secondary | ICD-10-CM | POA: Diagnosis not present

## 2023-01-12 DIAGNOSIS — L97522 Non-pressure chronic ulcer of other part of left foot with fat layer exposed: Secondary | ICD-10-CM | POA: Diagnosis not present

## 2023-01-12 DIAGNOSIS — M86672 Other chronic osteomyelitis, left ankle and foot: Secondary | ICD-10-CM | POA: Diagnosis not present

## 2023-01-12 DIAGNOSIS — E1169 Type 2 diabetes mellitus with other specified complication: Secondary | ICD-10-CM | POA: Diagnosis not present

## 2023-01-12 DIAGNOSIS — E11621 Type 2 diabetes mellitus with foot ulcer: Secondary | ICD-10-CM | POA: Diagnosis not present

## 2023-01-12 LAB — GLUCOSE, CAPILLARY
Glucose-Capillary: 241 mg/dL — ABNORMAL HIGH (ref 70–99)
Glucose-Capillary: 187 mg/dL — ABNORMAL HIGH (ref 70–99)

## 2023-01-12 NOTE — Progress Notes (Signed)
NUBAID, FEKETE ([id number]) [id number] Page 1 of 2 Visit Report for 01/12/2023 Arrival Information Details Patient Name: Date of Service: [name] 01/12/2023 10:00 A M Medical Record Number: [id number] Patient Account Number: [id number] Date of Birth/Sex: Treating RN: [birth year] (42 y.o. M) Primary Care Selby Foisy: [name] Other Clinician: Referring Tyland Klemens: Treating Mandy Peeks/Extender: Greta Doom in Treatment: 14 Visit Information History Since Last Visit Added or deleted any medications: No Patient Arrived: Ambulatory Any new allergies or adverse reactions: No Arrival Time: 11:36 Had a fall or experienced change in No Accompanied By: self activities of daily living that may affect Transfer Assistance: None risk of falls: Patient Identification Verified: Yes Signs or symptoms of abuse/neglect since last visito No Secondary Verification Process Completed: Yes Hospitalized since last visit: No Patient Requires Transmission-Based Precautions: No Implantable device outside of the clinic excluding No Patient Has Alerts: Yes cellular tissue based products placed in the center Patient Alerts: Left ABI 1.03 05/13/22 since last visit: Right ABI .78 05/13/22 Pain Present Now: No Electronic Signature(s) Signed: 01/12/2023 3:39:03 PM By: Demetria Pore Entered By: Demetria Pore on 01/12/2023 10:35:20 -------------------------------------------------------------------------------- Encounter Discharge Information Details Patient Name: Date of Service: [name] 01/12/2023 10:00 A M Medical Record Number: [id number] Patient Account Number: [id number] Date of Birth/Sex: Treating RN: [birth year] (42 y.o. M) Primary Care Laker Thompson: [name] Other Clinician: Referring Ocean Kearley: Treating Xavia Kniskern/Extender: Greta Doom in Treatment: 14 Encounter Discharge Information Items Discharge  Condition: Stable Ambulatory Status: Ambulatory Discharge Destination: Home Transportation: Private Auto Accompanied By: self Schedule Follow-up Appointment: Yes Clinical Summary of Care: NASEAN, HIGINBOTHAM ([id number]) [id number] Page 2 of 2 Electronic Signature(s) Signed: 01/12/2023 3:39:03 PM By: Demetria Pore Entered By: Demetria Pore on 01/12/2023 10:36:10 -------------------------------------------------------------------------------- Vitals Details Patient Name: Date of Service: [name] 01/12/2023 10:00 A M Medical Record Number: [id number] Patient Account Number: [id number] Date of Birth/Sex: Treating RN: 06/16/1980 (42 y.o. M) Primary Care Shareef Eddinger: [name] Other Clinician: Referring Ketzia Guzek: Treating Candido Flott/Extender: Greta Doom in Treatment: 14 Vital Signs Time Taken: 10:00 Temperature (F): 97.9 Height (in): 73 Pulse (bpm): 99 Weight (lbs): 330 Respiratory Rate (breaths/min): 16 Body Mass Index (BMI): 43.5 Blood Pressure (mmHg): 160/80 Capillary Blood Glucose (mg/dl): 427 Reference Range: 80 - 120 mg / dl Airway Pulse Oximetry (%): 98 Electronic Signature(s) Signed: 01/12/2023 3:39:03 PM By: Demetria Pore Entered By: Demetria Pore on 01/12/2023 10:35:23

## 2023-01-13 ENCOUNTER — Encounter (HOSPITAL_BASED_OUTPATIENT_CLINIC_OR_DEPARTMENT_OTHER): Payer: 59 | Admitting: Internal Medicine

## 2023-01-13 ENCOUNTER — Encounter: Payer: Self-pay | Admitting: Internal Medicine

## 2023-01-13 DIAGNOSIS — Z794 Long term (current) use of insulin: Secondary | ICD-10-CM

## 2023-01-13 DIAGNOSIS — L97528 Non-pressure chronic ulcer of other part of left foot with other specified severity: Secondary | ICD-10-CM

## 2023-01-13 DIAGNOSIS — E11621 Type 2 diabetes mellitus with foot ulcer: Secondary | ICD-10-CM

## 2023-01-13 DIAGNOSIS — L97522 Non-pressure chronic ulcer of other part of left foot with fat layer exposed: Secondary | ICD-10-CM

## 2023-01-13 DIAGNOSIS — E1169 Type 2 diabetes mellitus with other specified complication: Secondary | ICD-10-CM | POA: Diagnosis not present

## 2023-01-13 DIAGNOSIS — E1140 Type 2 diabetes mellitus with diabetic neuropathy, unspecified: Secondary | ICD-10-CM | POA: Diagnosis not present

## 2023-01-13 DIAGNOSIS — M86672 Other chronic osteomyelitis, left ankle and foot: Secondary | ICD-10-CM | POA: Diagnosis not present

## 2023-01-13 LAB — GLUCOSE, CAPILLARY
Glucose-Capillary: 206 mg/dL — ABNORMAL HIGH (ref 70–99)
Glucose-Capillary: 200 mg/dL — ABNORMAL HIGH (ref 70–99)

## 2023-01-13 NOTE — Progress Notes (Signed)
IAM, SENDER ([id number]) [id number] Page 1 of 2 Visit Report for 01/12/2023 HBO Details Patient Name: Date of Service: [name], [name] 01/12/2023 10:00 A M Medical Record Number: [id number] Patient Account Number: [id number] Date of Birth/Sex: Treating RN: 05/26/1980 (42 y.o. 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 HBO Treatment Details Treatment Number: 8 Patient Type: Outpatient Chamber Type: Monoplace Chamber Serial #: [id number] Treatment Protocol: 2.0 ATA with 90 minutes oxygen, with two 5 minute air breaks Treatment Details Compression Rate Down: 2.0 psi / minute De-Compression Rate Up: 1.5 psi / minute A breaks and breathing ir Compress Tx Pressure periods Decompress Decompress Begins Reached (leave unused spaces Begins Ends blank) Chamber Pressure (ATA 1 2 2 2 2 2  --2 1 ) Clock Time (24 hr) 10:04 10:14 10:45 10:50 11:20 11:25 - - 11:55 12:05 Treatment Length: 121 (minutes) Treatment Segments: 4 Vital Signs Capillary Blood Glucose Reference Range: 80 - 120 mg / dl HBO Diabetic Blood Glucose Intervention Range: <131 mg/dl or >235 mg/dl Time Vitals Blood Respiratory Capillary Blood Glucose Pulse Action Type: Pulse: Temperature: Taken: Pressure: Rate: Glucose (mg/dl): Meter #: Oximetry (%) Taken: Pre 10:00 160/80 99 16 97.9 241 98 Treatment Response Treatment Toleration: Well Treatment Completion Status: Treatment Completed without Adverse Event Electronic Signature(s) Signed: 01/12/2023 3:39:03 PM By: Demetria Pore Signed: 01/12/2023 6:45:54 PM By: Allen Derry PA-C Entered By: Demetria Pore on 01/12/2023 10:35:38 [name] ([id number]) [id number] Page 2 of 2 -------------------------------------------------------------------------------- HBO Safety Checklist Details Patient Name: Date of Service: [name], [name] 01/12/2023 10:00 A M Medical Record Number: [id number] Patient Account Number: [id number] Date of Birth/Sex: Treating RN: Dec 05, 1980 (42 y.o. M) Primary Care Gertrude Bucks: [name] Other Clinician: Referring Annesha Delgreco: Treating English Tomer/Extender: Greta Doom in Treatment: 14 HBO Safety Checklist Items Safety Checklist Consent Form Signed Patient voided / foley secured and emptied When did you last eato 01/12/23 Last dose of injectable or oral agent 01/12/23 Ostomy pouch emptied and vented if applicable NA All implantable devices assessed, documented and approved NA Intravenous access site secured and place NA Valuables secured Linens and cotton and cotton/polyester blend (less than 51% polyester) Personal oil-based products / skin lotions / body lotions removed Wigs or hairpieces removed NA Smoking or tobacco materials removed NA Books / newspapers / magazines / loose paper removed Cologne, aftershave, perfume and deodorant removed Jewelry removed (may wrap wedding band) Make-up removed NA Hair care products removed Battery operated devices (external) removed NA Heating patches and chemical warmers removed NA Titanium eyewear removed NA Nail polish cured greater than 10 hours NA Casting material cured greater than 10 hours NA Hearing aids removed NA Loose dentures or partials removed NA Prosthetics have been removed NA Patient demonstrates correct use of air break device (if applicable) Patient concerns have been addressed Patient grounding bracelet on and cord attached to chamber Specifics for Inpatients (complete in addition to above) Medication sheet sent with  patient NA Intravenous medications needed or due during therapy sent with patient NA Drainage tubes (e.g. nasogastric tube or chest tube secured and vented) NA Endotracheal or Tracheotomy tube secured NA Cuff deflated of air and inflated with saline NA Airway suctioned NA Electronic Signature(s) Signed: 01/12/2023 3:39:03 PM By: Demetria Pore Entered By: Lynnell Jude  Kim on 01/12/2023 10:35:26

## 2023-01-14 ENCOUNTER — Ambulatory Visit: Payer: 59 | Admitting: Orthopedic Surgery

## 2023-01-14 ENCOUNTER — Encounter: Payer: 59 | Admitting: Physician Assistant

## 2023-01-14 DIAGNOSIS — E11621 Type 2 diabetes mellitus with foot ulcer: Secondary | ICD-10-CM | POA: Diagnosis not present

## 2023-01-14 DIAGNOSIS — L97522 Non-pressure chronic ulcer of other part of left foot with fat layer exposed: Secondary | ICD-10-CM | POA: Diagnosis not present

## 2023-01-14 DIAGNOSIS — Z89411 Acquired absence of right great toe: Secondary | ICD-10-CM | POA: Diagnosis not present

## 2023-01-14 DIAGNOSIS — M86672 Other chronic osteomyelitis, left ankle and foot: Secondary | ICD-10-CM

## 2023-01-14 DIAGNOSIS — E1169 Type 2 diabetes mellitus with other specified complication: Secondary | ICD-10-CM | POA: Diagnosis not present

## 2023-01-14 DIAGNOSIS — S98111A Complete traumatic amputation of right great toe, initial encounter: Secondary | ICD-10-CM

## 2023-01-14 DIAGNOSIS — E1140 Type 2 diabetes mellitus with diabetic neuropathy, unspecified: Secondary | ICD-10-CM | POA: Diagnosis not present

## 2023-01-14 LAB — GLUCOSE, CAPILLARY
Glucose-Capillary: 228 mg/dL — ABNORMAL HIGH (ref 70–99)
Glucose-Capillary: 202 mg/dL — ABNORMAL HIGH (ref 70–99)

## 2023-01-14 NOTE — Progress Notes (Signed)
[name], [name] ([id number]) [id number] Page 1 of 2 Visit Report for 01/14/2023 Arrival Information Details Patient Name: Date of Service: [name], [name] 01/14/2023 10:00 A M Medical Record Number: [id number] Patient Account Number: [id number] Date of Birth/Sex: Treating RN: [birth year] (42 y.o. M) Primary Care Omelia Marquart: [name] Other Clinician: Referring Adrina Armijo: Treating Dusty Raczkowski/Extender: Greta Doom in Treatment: 15 Visit Information History Since Last Visit Added or deleted any medications: No Patient Arrived: Ambulatory Any new allergies or adverse reactions: No Arrival Time: 10:00 Had a fall or experienced change in No Accompanied By: self activities of daily living that may affect Transfer Assistance: None risk of falls: Patient Identification Verified: Yes Signs or symptoms of abuse/neglect since last visito No Secondary Verification Process Completed: Yes Hospitalized since last visit: No Patient Requires Transmission-Based Precautions: No Implantable device outside of the clinic excluding No Patient Has Alerts: Yes cellular tissue based products placed in the center Patient Alerts: Left ABI 1.03 05/13/22 since last visit: Right ABI .78 05/13/22 Pain Present Now: No Electronic Signature(s) Signed: 01/14/2023 3:54:28 PM By: Demetria Pore Entered By: Demetria Pore on 01/14/2023 12:52:27 -------------------------------------------------------------------------------- Encounter Discharge Information Details Patient Name: Date of Service: [name] 01/14/2023 10:00 A M Medical Record Number: [id number] Patient Account Number: [id number] Date of Birth/Sex: Treating RN: 11/10/1980 (42 y.o. M) Primary Care Daje Stark: [name] Other Clinician: Referring Monifa Blanchette: Treating Charistopher Rumble/Extender: Greta Doom in Treatment: 15 Encounter Discharge Information Items Discharge  Condition: Stable Ambulatory Status: Ambulatory Discharge Destination: Home Transportation: Private Auto Accompanied By: self Schedule Follow-up Appointment: Yes Clinical Summary of Care: [name], [name] ([id number]) [id number] Page 2 of 2 Electronic Signature(s) Signed: 01/14/2023 3:54:28 PM By: Demetria Pore Entered By: Demetria Pore on 01/14/2023 12:54:09 -------------------------------------------------------------------------------- Vitals Details Patient Name: Date of Service: [name] 01/14/2023 10:00 A M Medical Record Number: [id number] Patient Account Number: [id number] Date of Birth/Sex: Treating RN: Jul 03, 1980 (42 y.o. M) Primary Care Garl Speigner: [name] Other Clinician: Referring Atina Feeley: Treating Larone Kliethermes/Extender: Greta Doom in Treatment: 15 Vital Signs Time Taken: 10:00 Temperature (F): 97.9 Height (in): 73 Pulse (bpm): 114 Weight (lbs): 330 Respiratory Rate (breaths/min): 16 Body Mass Index (BMI): 43.5 Blood Pressure (mmHg): 138/80 Capillary Blood Glucose (mg/dl): 951 Reference Range: 80 - 120 mg / dl Airway Pulse Oximetry (%): 99 Electronic Signature(s) Signed: 01/14/2023 3:54:28 PM By: Demetria Pore Entered By: Demetria Pore on 01/14/2023 12:52:29

## 2023-01-14 NOTE — Progress Notes (Signed)
[name] ([id number]) [id number] Page 1 of 2 Visit Report for 01/13/2023 Arrival Information Details Patient Name: Date of Service: [name], [name] 01/13/2023 10:00 A M Medical Record Number: [id number] Patient Account Number: [id number] Date of Birth/Sex: Treating RN: [birth year] (42 y.o. M) Primary Care Trestin Vences: [name] Other Clinician: Referring Cregg Jutte: Treating Kamoria Lucien/Extender: [name] in Treatment: 15 Visit Information History Since Last Visit Added or deleted any medications: No Patient Arrived: Ambulatory Any new allergies or adverse reactions: No Arrival Time: 10:00 Had a fall or experienced change in No Accompanied By: self activities of daily living that may affect Transfer Assistance: None risk of falls: Patient Identification Verified: Yes Signs or symptoms of abuse/neglect since last visito No Secondary Verification Process Completed: Yes Hospitalized since last visit: No Patient Requires Transmission-Based Precautions: No Implantable device outside of the clinic excluding No Patient Has Alerts: Yes cellular tissue based products placed in the center Patient Alerts: Left ABI 1.03 05/13/22 since last visit: Right ABI .78 05/13/22 Pain Present Now: No Electronic Signature(s) Signed: 01/14/2023 3:54:28 PM By: Demetria Pore Entered By: Demetria Pore on 01/13/2023 12:10:50 -------------------------------------------------------------------------------- Encounter Discharge Information Details Patient Name: Date of Service: [name] 01/13/2023 10:00 A M Medical Record Number: [id number] Patient Account Number: [id number] Date of Birth/Sex: Treating RN: 12-20-1980 (42 y.o. M) Primary Care Collie Kittel: [name] Other Clinician: Referring Mishon Blubaugh: Treating Jahleel Stroschein/Extender: [name] in Treatment: 15 Encounter Discharge Information  Items Discharge Condition: Stable Ambulatory Status: Ambulatory Discharge Destination: Home Transportation: Private Auto Accompanied By: self Schedule Follow-up Appointment: Yes Clinical Summary of Care: HARTZELL, RICCIARDELLI ([id number]) [id number] Page 2 of 2 Electronic Signature(s) Signed: 01/14/2023 3:54:28 PM By: Demetria Pore Entered By: Demetria Pore on 01/13/2023 12:21:40 -------------------------------------------------------------------------------- Vitals Details Patient Name: Date of Service: [name] 01/13/2023 10:00 A M Medical Record Number: [id number] Patient Account Number: [id number] Date of Birth/Sex: Treating RN: March 27, 1981 (42 y.o. M) Primary Care Aleaya Latona: [name] Other Clinician: Referring Brettney Ficken: Treating Davina Howlett/Extender: [name] in Treatment: 15 Vital Signs Time Taken: 10:00 Temperature (F): 98.0 Height (in): 73 Pulse (bpm): 116 Weight (lbs): 330 Respiratory Rate (breaths/min): 16 Body Mass Index (BMI): 43.5 Blood Pressure (mmHg): 130/80 Capillary Blood Glucose (mg/dl): 387 Reference Range: 80 - 120 mg / dl Airway Pulse Oximetry (%): 99 Electronic Signature(s) Signed: 01/14/2023 3:54:28 PM By: Demetria Pore Entered By: Demetria Pore on 01/13/2023 12:10:56

## 2023-01-14 NOTE — Progress Notes (Signed)
JAIRE, CLAYDON ([id number]) [id number] Page 1 of 2 Visit Report for 01/13/2023 HBO Details Patient Name: Date of Service: [name], [name] 01/13/2023 10:00 A M Medical Record Number: [id number] Patient Account Number: [id number] Date of Birth/Sex: Treating RN: 03-08-1981 (42 y.o. 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 HBO Treatment Details Treatment Number: 9 Patient Type: Outpatient Chamber Type: Monoplace Chamber Serial #: [id number] Treatment Protocol: 2.0 ATA with 90 minutes oxygen, with two 5 minute air breaks Treatment Details Compression Rate Down: 2.0 psi / minute De-Compression Rate Up: 1.5 psi / minute A breaks and breathing ir Compress Tx Pressure periods Decompress Decompress Begins Reached (leave unused spaces Begins Ends blank) Chamber Pressure (ATA 1 2 2 2 2 2  --2 1 ) Clock Time (24 hr) 10:09 10:20 10:50 10:55 11:25 11:30 - - 12:00 12:10 Treatment Length: 121 (minutes) Treatment Segments: 4 Vital Signs Capillary Blood Glucose Reference Range: 80 - 120 mg / dl HBO Diabetic Blood Glucose Intervention Range: <131 mg/dl or >782 mg/dl Time Vitals Blood Respiratory Capillary Blood Glucose Pulse Action Type: Pulse: Temperature: Taken: Pressure: Rate: Glucose (mg/dl): Meter #: Oximetry (%) Taken: Pre 10:00 130/80 116 16 98 206 99 Treatment Response Treatment Toleration: Well Treatment Completion Status: Treatment Completed without Adverse Event HBO Attestation I certify that I supervised this HBO treatment in accordance with Medicare guidelines. A trained emergency response  team is readily available per Yes hospital policies and procedures. Continue HBOT as ordered. Yes Electronic Signature(s) Signed: 01/13/2023 3:13:38 PM By: Geralyn Corwin DO Entered By: Geralyn Corwin on 01/13/2023 11:45:27 [name] ([id number]) [id number] Page 2 of 2 -------------------------------------------------------------------------------- HBO Safety Checklist Details Patient Name: Date of Service: [name], [name] 01/13/2023 10:00 A M Medical Record Number: [id number] Patient Account Number: [id number] Date of Birth/Sex: Treating RN: 1980/06/25 (42 y.o. M) Primary Care Numair Masden: [name] Other Clinician: Referring Britney Newstrom: Treating Felesha Moncrieffe/Extender: [name] in Treatment: 15 HBO Safety Checklist Items Safety Checklist Consent Form Signed Patient voided / foley secured and emptied When did you last eato 01/13/23 Last dose of injectable or oral agent 01/13/23 Ostomy pouch emptied and vented if applicable NA All implantable devices assessed, documented and approved NA Intravenous access site secured and place NA Valuables secured Linens and cotton and cotton/polyester blend (less than 51% polyester) Personal oil-based products / skin lotions / body lotions removed Wigs or hairpieces removed NA Smoking or tobacco materials removed NA Books / newspapers / magazines / loose paper removed Cologne, aftershave, perfume and deodorant removed Jewelry removed (may wrap wedding band) Make-up removed NA Hair care products removed Battery operated devices (external) removed NA Heating patches and chemical warmers removed NA Titanium eyewear removed NA Nail polish cured greater than 10 hours NA Casting material cured greater than 10 hours NA Hearing aids removed NA Loose dentures or partials removed NA Prosthetics have been removed NA Patient demonstrates correct use of air break device  (if applicable) Patient concerns have been addressed Patient grounding bracelet on and cord attached to chamber Specifics for Inpatients (complete in addition to above) Medication sheet sent with patient NA Intravenous medications needed or due during therapy sent with patient NA Drainage tubes (e.g. nasogastric tube or chest tube secured and vented) NA Endotracheal or Tracheotomy  tube secured NA Cuff deflated of air and inflated with saline NA Airway suctioned NA Electronic Signature(s) Signed: 01/14/2023 3:54:28 PM By: Demetria Pore Entered By: Demetria Pore on 01/13/2023 09:11:00

## 2023-01-15 ENCOUNTER — Other Ambulatory Visit: Payer: Self-pay

## 2023-01-15 ENCOUNTER — Encounter: Payer: 59 | Admitting: Physician Assistant

## 2023-01-15 DIAGNOSIS — E1140 Type 2 diabetes mellitus with diabetic neuropathy, unspecified: Secondary | ICD-10-CM | POA: Diagnosis not present

## 2023-01-15 DIAGNOSIS — M86672 Other chronic osteomyelitis, left ankle and foot: Secondary | ICD-10-CM | POA: Diagnosis not present

## 2023-01-15 DIAGNOSIS — L97522 Non-pressure chronic ulcer of other part of left foot with fat layer exposed: Secondary | ICD-10-CM | POA: Diagnosis not present

## 2023-01-15 DIAGNOSIS — E11621 Type 2 diabetes mellitus with foot ulcer: Secondary | ICD-10-CM | POA: Diagnosis not present

## 2023-01-15 DIAGNOSIS — E1169 Type 2 diabetes mellitus with other specified complication: Secondary | ICD-10-CM | POA: Diagnosis not present

## 2023-01-15 LAB — GLUCOSE, CAPILLARY
Glucose-Capillary: 220 mg/dL — ABNORMAL HIGH (ref 70–99)
Glucose-Capillary: 156 mg/dL — ABNORMAL HIGH (ref 70–99)

## 2023-01-15 NOTE — Progress Notes (Signed)
ISSIAC, SEACHRIST ([id number]) [id number] Page 1 of 2 Visit Report for 01/14/2023 HBO Details Patient Name: Date of Service: [name], [name] 01/14/2023 10:00 A M Medical Record Number: [id number] Patient Account Number: [id number] Date of Birth/Sex: Treating RN: 1980/09/21 (42 y.o. 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 HBO Treatment Details Treatment Number: 10 Patient Type: Outpatient Chamber Type: Monoplace Chamber Serial #: [id number] Treatment Protocol: 2.0 ATA with 90 minutes oxygen, with two 5 minute air breaks Treatment Details Compression Rate Down: 2.0 psi / minute De-Compression Rate Up: 1.5 psi / minute A breaks and breathing ir Compress Tx Pressure periods Decompress Decompress Begins Reached (leave unused spaces Begins Ends blank) Chamber Pressure (ATA 1 2 2 2 2 2  --2 1 ) Clock Time (24 hr) 10:08 10:18 10:48 10:53 11:24 11:29 - - 11:59 12:09 Treatment Length: 121 (minutes) Treatment Segments: 4 Vital Signs Capillary Blood Glucose Reference Range: 80 - 120 mg / dl HBO Diabetic Blood Glucose Intervention Range: <131 mg/dl or >403 mg/dl Time Vitals Blood Respiratory Capillary Blood Glucose Pulse Action Type: Pulse: Temperature: Taken: Pressure: Rate: Glucose (mg/dl): Meter #: Oximetry (%) Taken: Pre 10:00 138/80 114 16 97.9 228 1 99 non per prototcol Post 12:09 134/80 100 16 98 202 1 99 non per protocol Treatment Response Treatment Toleration: Well Treatment Completion Status: Treatment Completed without Adverse Event Electronic Signature(s) Signed: 01/14/2023 3:54:28 PM By:  Demetria Pore Signed: 01/15/2023 1:57:59 PM By: Allen Derry PA-C Entered By: Demetria Pore on 01/14/2023 12:53:36 [name] ([id number]) [id number] Page 2 of 2 -------------------------------------------------------------------------------- HBO Safety Checklist Details Patient Name: Date of Service: [name], [name] 01/14/2023 10:00 A M Medical Record Number: [id number] Patient Account Number: [id number] Date of Birth/Sex: Treating RN: [birth year] (42 y.o. M) Primary Care Pinchos Topel: [name] Other Clinician: Referring Riyanna Crutchley: Treating [place] Harral/Extender: Greta Doom in Treatment: 15 HBO Safety Checklist Items Safety Checklist Consent Form Signed Patient voided / foley secured and emptied When did you last eato 01/14/23 Last dose of injectable or oral agent 01/14/23 Ostomy pouch emptied and vented if applicable NA All implantable devices assessed, documented and approved NA Intravenous access site secured and place NA Valuables secured Linens and cotton and cotton/polyester blend (less than 51% polyester) Personal oil-based products / skin lotions / body lotions removed Wigs or hairpieces removed NA Smoking or tobacco materials removed NA Books / newspapers / magazines / loose paper removed Cologne, aftershave, perfume and deodorant removed Jewelry removed (may wrap wedding band) Make-up removed NA Hair care products removed Battery operated devices (external) removed NA Heating patches and chemical warmers removed NA Titanium eyewear removed NA Nail polish cured greater than 10 hours NA Casting material cured greater than 10 hours NA Hearing aids removed NA Loose dentures or partials removed NA Prosthetics have been removed NA Patient demonstrates correct use of air break device (if applicable) Patient concerns have been addressed Patient grounding bracelet on and cord attached to chamber Specifics  for Inpatients (complete in addition to above) Medication sheet sent with patient NA Intravenous medications needed or due during therapy sent with patient NA Drainage tubes (e.g. nasogastric tube or chest tube secured and vented) NA Endotracheal or Tracheotomy tube secured NA Cuff deflated of air and inflated with saline  NA Airway suctioned NA Electronic Signature(s) Signed: 01/14/2023 3:54:28 PM By: Demetria Pore Entered By: Demetria Pore on 01/14/2023 12:52:32

## 2023-01-15 NOTE — Progress Notes (Signed)
[name] ([id number]) [id number] Page 1 of 2 Visit Report for 01/15/2023 Arrival Information Details Patient Name: Date of Service: [name], [name] 01/15/2023 10:00 A M Medical Record Number: [id number] Patient Account Number: [id number] Date of Birth/Sex: Treating RN: 1981-03-02 (42 y.o. M) Primary Care Vada Swift: [name] Other Clinician: Referring Jaden Abreu: Treating Taliesin Hartlage/Extender: Greta Doom in Treatment: 15 Visit Information History Since Last Visit Added or deleted any medications: No Patient Arrived: Ambulatory Any new allergies or adverse reactions: No Arrival Time: 10:00 Had a fall or experienced change in No Accompanied By: self activities of daily living that may affect Transfer Assistance: None risk of falls: Patient Requires Transmission-Based Precautions: No Signs or symptoms of abuse/neglect since last visito No Patient Has Alerts: Yes Hospitalized since last visit: No Patient Alerts: Left ABI 1.03 05/13/22 Implantable device outside of the clinic excluding No Right ABI .78 05/13/22 cellular tissue based products placed in the center since last visit: Pain Present Now: No Electronic Signature(s) Signed: 01/15/2023 12:28:41 PM By: Demetria Pore Entered By: Demetria Pore on 01/15/2023 09:00:53 -------------------------------------------------------------------------------- Encounter Discharge Information Details Patient Name: Date of Service: [name] 01/15/2023 10:00 A M Medical Record Number: [id number] Patient Account Number: [id number] Date of Birth/Sex: Treating RN: 1980/11/11 (42 y.o. M) Primary Care Tonimarie Gritz: [name] Other Clinician: Referring Alonso Gapinski: Treating Reyanna Baley/Extender: Greta Doom in Treatment: 15 Encounter Discharge Information Items Discharge Condition: Stable Ambulatory Status: Ambulatory Discharge Destination:  Home Transportation: Private Auto Accompanied By: self Schedule Follow-up Appointment: Yes Clinical Summary of Care: REFUJIO, BOLDON ([id number]) [id number] Page 2 of 2 Electronic Signature(s) Signed: 01/15/2023 12:28:41 PM By: Demetria Pore Entered By: Demetria Pore on 01/15/2023 09:19:45 -------------------------------------------------------------------------------- Vitals Details Patient Name: Date of Service: [name] 01/15/2023 10:00 A M Medical Record Number: [id number] Patient Account Number: [id number] Date of Birth/Sex: Treating RN: 28-Oct-1980 (42 y.o. M) Primary Care Ilana Prezioso: [name] Other Clinician: Referring Anni Hocevar: Treating Solstice Lastinger/Extender: Greta Doom in Treatment: 15 Vital Signs Time Taken: 10:00 Temperature (F): 97.9 Height (in): 73 Pulse (bpm): 89 Weight (lbs): 330 Respiratory Rate (breaths/min): 16 Body Mass Index (BMI): 43.5 Blood Pressure (mmHg): 120/80 Capillary Blood Glucose (mg/dl): 536 Reference Range: 80 - 120 mg / dl Airway Pulse Oximetry (%): 99 Electronic Signature(s) Signed: 01/15/2023 12:28:41 PM By: Demetria Pore Entered By: Demetria Pore on 01/15/2023 09:01:22

## 2023-01-15 NOTE — Progress Notes (Signed)
BILLE, PALLONE ([id number]) [id number] Page 1 of 2 Visit Report for 01/15/2023 HBO Details Patient Name: Date of Service: [name], [name] 01/15/2023 10:00 A M Medical Record Number: [id number] Patient Account Number: [id number] Date of Birth/Sex: Treating RN: 06/06/1980 (42 y.o. 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 HBO Treatment Details Treatment Number: 11 Patient Type: Outpatient Chamber Type: Monoplace Chamber Serial #: [id number] Treatment Protocol: 2.0 ATA with 90 minutes oxygen, with two 5 minute air breaks Treatment Details Compression Rate Down: 2.0 psi / minute De-Compression Rate Up: 1.5 psi / minute A breaks and breathing ir Compress Tx Pressure periods Decompress Decompress Begins Reached (leave unused spaces Begins Ends blank) Chamber Pressure (ATA 1 2 2 2 2 2  --2 1 ) Clock Time (24 hr) 10:06 10:39 10:47 10:52 11:23 11:28 - - 11:58 12:08 Treatment Length: 122 (minutes) Treatment Segments: 4 Vital Signs Capillary Blood Glucose Reference Range: 80 - 120 mg / dl HBO Diabetic Blood Glucose Intervention Range: <131 mg/dl or >664 mg/dl Time Vitals Blood Respiratory Capillary Blood Glucose Pulse Action Type: Pulse: Temperature: Taken: Pressure: Rate: Glucose (mg/dl): Meter #: Oximetry (%) Taken: Pre 10:00 120/80 89 16 97.9 220 1 99 non per protocol Post 12:08 130/80 90 16 98 156 1 99 non per protocol Treatment Response Treatment Toleration: Well Treatment Completion Status: Treatment Completed without Adverse Event Electronic Signature(s) Signed: 01/15/2023 12:28:41 PM By: Demetria Pore Signed: 01/15/2023 1:57:59 PM By: Allen Derry PA-C Entered By: Demetria Pore on 01/15/2023 09:19:22 [name] ([id number]) [id number] Page 2 of 2 -------------------------------------------------------------------------------- HBO Safety Checklist Details Patient Name: Date of Service: [name], [name] 01/15/2023 10:00 A M Medical Record Number: [id number] Patient Account Number: [id number] Date of Birth/Sex: Treating RN: [birth year] (42 y.o. M) Primary Care Isabella Roemmich: [name] Other Clinician: Referring Rosha Cocker: Treating Jeffey Janssen/Extender: Greta Doom in Treatment: 15 HBO Safety Checklist Items Safety Checklist Consent Form Signed Patient voided / foley secured and emptied When did you last eato 01/15/23 Last dose of injectable or oral agent 01/15/23 Ostomy pouch emptied and vented if applicable NA All implantable devices assessed, documented and approved NA Intravenous access site secured and place NA Valuables secured Linens and cotton and cotton/polyester blend (less than 51% polyester) Personal oil-based products / skin lotions / body lotions removed Wigs or hairpieces removed NA Smoking or tobacco materials removed NA Books / newspapers / magazines / loose paper removed Cologne, aftershave, perfume and deodorant removed Jewelry removed (may wrap wedding band) NA Make-up removed NA Hair care products removed Battery operated devices (external) removed NA Heating patches and chemical warmers removed NA Titanium eyewear removed NA Nail polish cured greater than 10 hours NA Casting material cured greater than 10 hours NA Hearing aids removed NA Loose dentures or partials removed NA Prosthetics have been removed NA Patient demonstrates correct use of air break device (if applicable) Patient concerns have been addressed Patient grounding bracelet on and cord attached to chamber Specifics for  Inpatients (complete in addition to above) Medication sheet sent with patient NA Intravenous medications needed or due during therapy sent with patient NA Drainage tubes (e.g. nasogastric tube or chest tube secured and vented) NA Endotracheal or Tracheotomy tube secured NA Cuff deflated of air and inflated with  saline NA Airway suctioned NA Electronic Signature(s) Signed: 01/15/2023 12:28:41 PM By: Demetria Pore Entered By: Demetria Pore on 01/15/2023 09:03:08

## 2023-01-18 ENCOUNTER — Encounter: Payer: 59 | Admitting: Internal Medicine

## 2023-01-18 DIAGNOSIS — E11621 Type 2 diabetes mellitus with foot ulcer: Secondary | ICD-10-CM | POA: Diagnosis not present

## 2023-01-18 DIAGNOSIS — L97522 Non-pressure chronic ulcer of other part of left foot with fat layer exposed: Secondary | ICD-10-CM | POA: Diagnosis not present

## 2023-01-18 DIAGNOSIS — M86672 Other chronic osteomyelitis, left ankle and foot: Secondary | ICD-10-CM | POA: Diagnosis not present

## 2023-01-18 DIAGNOSIS — E1140 Type 2 diabetes mellitus with diabetic neuropathy, unspecified: Secondary | ICD-10-CM | POA: Diagnosis not present

## 2023-01-18 DIAGNOSIS — E1169 Type 2 diabetes mellitus with other specified complication: Secondary | ICD-10-CM | POA: Diagnosis not present

## 2023-01-18 LAB — GLUCOSE, CAPILLARY
Glucose-Capillary: 180 mg/dL — ABNORMAL HIGH (ref 70–99)
Glucose-Capillary: 166 mg/dL — ABNORMAL HIGH (ref 70–99)

## 2023-01-18 NOTE — Progress Notes (Signed)
KAIEA, LEWEY ([id number]) [id number] Page 1 of 2 Visit Report for 01/18/2023 HBO Details Patient Name: Date of Service: [name], [name] 01/18/2023 10:00 A M Medical Record Number: [id number] Patient Account Number: [id number] Date of Birth/Sex: Treating RN: 04/17/1981 (42 y.o. 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 HBO Treatment Details Treatment Number: 12 Patient Type: Outpatient Chamber Type: Monoplace Chamber Serial #: [id number] Treatment Protocol: 2.0 ATA with 90 minutes oxygen, with two 5 minute air breaks Treatment Details Compression Rate Down: 2.0 psi / minute De-Compression Rate Up: 1.5 psi / minute A breaks and breathing ir Compress Tx Pressure periods Decompress Decompress Begins Reached (leave unused spaces Begins Ends blank) Chamber Pressure (ATA 1 2 2 2 2 2  --2 1 ) Clock Time (24 hr) 10:08 10:18 10:49 10:54 11:24 11:29 - - 11:59 12:09 Treatment Length: 121 (minutes) Treatment Segments: 4 Vital Signs Capillary Blood Glucose Reference Range: 80 - 120 mg / dl HBO Diabetic Blood Glucose Intervention Range: <131 mg/dl or >086 mg/dl Time Vitals Blood Respiratory Capillary Blood Glucose Pulse Action Type: Pulse: Temperature: Taken: Pressure: Rate: Glucose (mg/dl): Meter #: Oximetry (%) Taken: Pre 10:00 140/80 74 16 98 180 1 99 non per protocol Post 12:09 126/80 78 16 98 166 1 99 non per protocol Treatment Response Treatment Completion Status: Treatment Completed without Adverse Event [name] Notes No concerns with treatment given HBO Attestation I certify that I  supervised this HBO treatment in accordance with Medicare guidelines. A trained emergency response team is readily available per Yes hospital policies and procedures. Continue HBOT as ordered. Yes Electronic Signature(s) Signed: 01/18/2023 4:34:10 PM By: Baltazar Najjar MD Previous Signature: 01/18/2023 12:28:19 PM Version By: Demetria Pore Entered By: Baltazar Najjar on 01/18/2023 12:22:10 [name] ([id number]) [id number] Page 2 of 2 -------------------------------------------------------------------------------- HBO Safety Checklist Details Patient Name: Date of Service: [name], [name] 01/18/2023 10:00 A M Medical Record Number: [id number] Patient Account Number: [id number] Date of Birth/Sex: Treating RN: Mar 05, 1981 (42 y.o. M) Primary Care Farha Dano: [name] Other Clinician: Referring [place] Marchiano: Treating Tyshawn Ciullo/Extender: [name], [name] in Treatment: 15 HBO Safety Checklist Items Safety Checklist Consent Form Signed Patient voided / foley secured and emptied When did you last eato 01/17/23 Last dose of injectable or oral agent 01/18/23 Ostomy pouch emptied and vented if applicable NA All implantable devices assessed, documented and approved NA Intravenous access site secured and place NA Valuables secured Linens and cotton and cotton/polyester blend (less than 51% polyester) Personal oil-based products / skin lotions / body lotions removed Wigs or hairpieces removed NA Smoking or tobacco materials removed NA Books / newspapers / magazines / loose paper removed Cologne, aftershave, perfume and deodorant removed Jewelry removed (may wrap wedding band) NA Make-up removed NA Hair care products removed Battery operated devices (external) removed NA Heating patches and chemical warmers removed NA Titanium eyewear removed NA Nail polish cured greater than 10 hours NA Casting material cured greater  than 10 hours NA Hearing aids removed NA Loose dentures or partials removed NA Prosthetics have been removed NA Patient demonstrates correct use of air break device (if applicable) Patient concerns have been addressed Patient grounding bracelet on and cord attached to chamber Specifics for  Inpatients (complete in addition to above) Medication sheet sent with patient NA Intravenous medications needed or due during therapy sent with patient NA Drainage tubes (e.g. nasogastric tube or chest tube secured and vented) NA Endotracheal or Tracheotomy tube secured NA Cuff deflated of air and inflated with saline NA Airway suctioned NA Electronic Signature(s) Signed: 01/18/2023 12:28:19 PM By: Demetria Pore Entered By: Demetria Pore on 01/18/2023 09:26:49

## 2023-01-18 NOTE — Progress Notes (Signed)
[name], [name] ([id number]) [id number] Page 1 of 2 Visit Report for 01/18/2023 Arrival Information Details Patient Name: Date of Service: [name] 01/18/2023 10:00 A M Medical Record Number: [id number] Patient Account Number: [id number] Date of Birth/Sex: Treating RN: [birth year] (42 y.o. M) Primary Care Kamela Blansett: [name] Other Clinician: Referring Tadarius Maland: Treating Natsuko Kelsay/Extender: [name], [name] in Treatment: 15 Visit Information History Since Last Visit Added or deleted any medications: No Patient Arrived: Ambulatory Any new allergies or adverse reactions: No Arrival Time: 10:00 Had a fall or experienced change in No Accompanied By: self activities of daily living that may affect Transfer Assistance: None risk of falls: Patient Identification Verified: Yes Signs or symptoms of abuse/neglect since last visito No Secondary Verification Process Completed: Yes Hospitalized since last visit: No Patient Requires Transmission-Based Precautions: No Implantable device outside of the clinic excluding No Patient Has Alerts: Yes cellular tissue based products placed in the center Patient Alerts: Left ABI 1.03 05/13/22 since last visit: Right ABI .78 05/13/22 Pain Present Now: No Electronic Signature(s) Signed: 01/18/2023 12:28:19 PM By: Demetria Pore Entered By: Demetria Pore on 01/18/2023 09:26:42 -------------------------------------------------------------------------------- Encounter Discharge Information Details Patient Name: Date of Service: [name] 01/18/2023 10:00 A M Medical Record Number: [id number] Patient Account Number: [id number] Date of Birth/Sex: Treating RN: [birth year] (42 y.o. M) Primary Care Jermall Isaacson: [name] Other Clinician: Referring Naysa Puskas: Treating Danaja Lasota/Extender: [name], [name] in Treatment: 15 Encounter Discharge Information  Items Discharge Condition: Stable Ambulatory Status: Ambulatory Discharge Destination: Home Transportation: Private Auto Accompanied By: self Schedule Follow-up Appointment: Yes Clinical Summary of Care: [name] ([id number]) [id number] Page 2 of 2 Electronic Signature(s) Signed: 01/18/2023 12:28:19 PM By: Demetria Pore Entered By: Demetria Pore on 01/18/2023 09:28:02 -------------------------------------------------------------------------------- Vitals Details Patient Name: Date of Service: [name] 01/18/2023 10:00 A M Medical Record Number: [id number] Patient Account Number: [id number] Date of Birth/Sex: Treating RN: 11/24/1980 (42 y.o. M) Primary Care Saxon Barich: [name] Other Clinician: Referring Orva Riles: Treating Arlington Sigmund/Extender: [name], [name] in Treatment: 15 Vital Signs Time Taken: 10:00 Temperature (F): 98.0 Height (in): 73 Pulse (bpm): 74 Weight (lbs): 330 Respiratory Rate (breaths/min): 16 Body Mass Index (BMI): 43.5 Blood Pressure (mmHg): 140/80 Capillary Blood Glucose (mg/dl): 536 Reference Range: 80 - 120 mg / dl Airway Pulse Oximetry (%): 99 Electronic Signature(s) Signed: 01/18/2023 12:28:19 PM By: Demetria Pore Entered By: Demetria Pore on 01/18/2023 09:26:46

## 2023-01-19 ENCOUNTER — Encounter: Payer: 59 | Admitting: Internal Medicine

## 2023-01-19 DIAGNOSIS — M86672 Other chronic osteomyelitis, left ankle and foot: Secondary | ICD-10-CM | POA: Diagnosis not present

## 2023-01-19 DIAGNOSIS — E11621 Type 2 diabetes mellitus with foot ulcer: Secondary | ICD-10-CM | POA: Diagnosis not present

## 2023-01-19 DIAGNOSIS — E1140 Type 2 diabetes mellitus with diabetic neuropathy, unspecified: Secondary | ICD-10-CM | POA: Diagnosis not present

## 2023-01-19 DIAGNOSIS — L97522 Non-pressure chronic ulcer of other part of left foot with fat layer exposed: Secondary | ICD-10-CM | POA: Diagnosis not present

## 2023-01-19 DIAGNOSIS — E1169 Type 2 diabetes mellitus with other specified complication: Secondary | ICD-10-CM | POA: Diagnosis not present

## 2023-01-19 LAB — GLUCOSE, CAPILLARY
Glucose-Capillary: 203 mg/dL — ABNORMAL HIGH (ref 70–99)
Glucose-Capillary: 205 mg/dL — ABNORMAL HIGH (ref 70–99)

## 2023-01-19 NOTE — Progress Notes (Signed)
[name] ([id number]) [id number] Page 1 of 2 Visit Report for 01/19/2023 Arrival Information Details Patient Name: Date of Service: [name], [name] 01/19/2023 10:00 A M Medical Record Number: [id number] Patient Account Number: [id number] Date of Birth/Sex: Treating RN: 04-Mar-1981 (42 y.o. M) Primary Care Aicha Clingenpeel: [name] Other Clinician: Referring Thurmon Mizell: Treating Veneda Kirksey/Extender: [name], [name] in Treatment: 15 Visit Information History Since Last Visit Added or deleted any medications: No Patient Arrived: Ambulatory Any new allergies or adverse reactions: No Arrival Time: 10:00 Had a fall or experienced change in No Accompanied By: self activities of daily living that may affect Transfer Assistance: None risk of falls: Patient Identification Verified: Yes Signs or symptoms of abuse/neglect since last visito No Secondary Verification Process Completed: Yes Hospitalized since last visit: No Patient Requires Transmission-Based Precautions: No Implantable device outside of the clinic excluding No Patient Has Alerts: Yes cellular tissue based products placed in the center Patient Alerts: Left ABI 1.03 05/13/22 since last visit: Right ABI .78 05/13/22 Pain Present Now: No Electronic Signature(s) Signed: 01/19/2023 3:05:08 PM By: Demetria Pore Entered By: Demetria Pore on 01/19/2023 13:13:52 -------------------------------------------------------------------------------- Encounter Discharge Information Details Patient Name: Date of Service: [name] 01/19/2023 10:00 A M Medical Record Number: [id number] Patient Account Number: [id number] Date of Birth/Sex: Treating RN: November 19, 1980 (42 y.o. M) Primary Care Vernadine Coombs: [name] Other Clinician: Referring Abbagail Scaff: Treating Azlaan Isidore/Extender: [name], [name] in Treatment: 15 Encounter Discharge Information  Items Discharge Condition: Stable Ambulatory Status: Ambulatory Discharge Destination: Home Transportation: Private Auto Accompanied By: self Schedule Follow-up Appointment: Yes Clinical Summary of Care: JACKHENRY, JAROSZEWSKI ([id number]) [id number] Page 2 of 2 Electronic Signature(s) Signed: 01/19/2023 3:05:08 PM By: Demetria Pore Entered By: Demetria Pore on 01/19/2023 13:15:30 -------------------------------------------------------------------------------- Vitals Details Patient Name: Date of Service: [name] 01/19/2023 10:00 A M Medical Record Number: [id number] Patient Account Number: [id number] Date of Birth/Sex: Treating RN: 25-Jul-1980 (42 y.o. M) Primary Care Labron Bloodgood: [name] Other Clinician: Referring Niajah Sipos: Treating Lyvia Mondesir/Extender: [name], [name] in Treatment: 15 Vital Signs Time Taken: 10:00 Temperature (F): 97.9 Height (in): 73 Pulse (bpm): 90 Weight (lbs): 330 Respiratory Rate (breaths/min): 16 Body Mass Index (BMI): 43.5 Blood Pressure (mmHg): 134/80 Capillary Blood Glucose (mg/dl): 244 Reference Range: 80 - 120 mg / dl Airway Pulse Oximetry (%): 99 Electronic Signature(s) Signed: 01/19/2023 3:05:08 PM By: Demetria Pore Entered By: Demetria Pore on 01/19/2023 13:13:55

## 2023-01-19 NOTE — Progress Notes (Signed)
[name], [name] ([id number]) [id number] Page 1 of 2 Visit Report for 01/19/2023 HBO Details Patient Name: Date of Service: [name], [name] 01/19/2023 10:00 A M Medical Record Number: [id number] Patient Account Number: [id number] Date of Birth/Sex: Treating RN: 1980/08/30 (42 y.o. 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 HBO Treatment Details Treatment Number: 13 Patient Type: Outpatient Chamber Type: Monoplace Chamber Serial #: [id number] Treatment Protocol: 2.0 ATA with 90 minutes oxygen, with two 5 minute air breaks Treatment Details Compression Rate Down: 2.0 psi / minute De-Compression Rate Up: 1.5 psi / minute A breaks and breathing ir Compress Tx Pressure periods Decompress Decompress Begins Reached (leave unused spaces Begins Ends blank) Chamber Pressure (ATA 1 2 2 2 2 2  --2 1 ) Clock Time (24 hr) 10:07 10:17 10:48 10:53 11:23 11:28 - - 11:59 12:09 Treatment Length: 122 (minutes) Treatment Segments: 4 Vital Signs Capillary Blood Glucose Reference Range: 80 - 120 mg / dl HBO Diabetic Blood Glucose Intervention Range: <131 mg/dl or >474 mg/dl Time Vitals Blood Respiratory Capillary Blood Glucose Pulse Action Type: Pulse: Temperature: Taken: Pressure: Rate: Glucose (mg/dl): Meter #: Oximetry (%) Taken: Pre 10:00 134/80 90 16 97.9 203 1 99 non per protocol Post 02:05 134/80 84 16 98 205 1 99 Treatment Response Treatment Toleration: Well Treatment Completion Status: Treatment Completed without Adverse Event Caton Popowski Notes No concerns with treatment given HBO Attestation I  certify that I supervised this HBO treatment in accordance with Medicare guidelines. A trained emergency response team is readily available per Yes hospital policies and procedures. Continue HBOT as ordered. Yes Electronic Signature(s) Signed: 01/19/2023 5:06:52 PM By: Baltazar Najjar MD Previous Signature: 01/19/2023 3:05:08 PM Version By: Demetria Pore Entered By: Baltazar Najjar on 01/19/2023 17:01:22 [name] ([id number]) [id number] Page 2 of 2 -------------------------------------------------------------------------------- HBO Safety Checklist Details Patient Name: Date of Service: [name], [name] 01/19/2023 10:00 A M Medical Record Number: [id number] Patient Account Number: [id number] Date of Birth/Sex: Treating RN: Oct 21, 1980 (42 y.o. M) Primary Care Abigial Newville: [name] Other Clinician: Referring Raylynne Cubbage: Treating Cheskel Silverio/Extender: [name], [name] in Treatment: 15 HBO Safety Checklist Items Safety Checklist Consent Form Signed Patient voided / foley secured and emptied When did you last eato 01/19/23 Last dose of injectable or oral agent 01/19/23 Ostomy pouch emptied and vented if applicable NA All implantable devices assessed, documented and approved NA Intravenous access site secured and place NA Valuables secured Linens and cotton and cotton/polyester blend (less than 51% polyester) Personal oil-based products / skin lotions / body lotions removed Wigs or hairpieces removed NA Smoking or tobacco materials removed NA Books / newspapers / magazines / loose paper removed Cologne, aftershave, perfume and deodorant removed Jewelry removed (may wrap wedding band) Make-up removed NA Hair care products removed Battery operated devices (external) removed NA Heating patches and chemical warmers removed NA Titanium eyewear removed NA Nail polish cured greater than 10 hours NA Casting material  cured greater than 10 hours NA Hearing aids removed NA Loose dentures or partials removed NA Prosthetics have been removed NA Patient demonstrates correct use of air break device (if applicable) Patient concerns have been addressed Patient grounding bracelet on and cord attached to chamber Specifics for Inpatients (  complete in addition to above) Medication sheet sent with patient NA Intravenous medications needed or due during therapy sent with patient NA Drainage tubes (e.g. nasogastric tube or chest tube secured and vented) NA Endotracheal or Tracheotomy tube secured NA Cuff deflated of air and inflated with saline NA Airway suctioned NA Electronic Signature(s) Signed: 01/19/2023 3:05:08 PM By: Demetria Pore Entered By: Demetria Pore on 01/19/2023 13:13:58

## 2023-01-20 ENCOUNTER — Encounter (HOSPITAL_BASED_OUTPATIENT_CLINIC_OR_DEPARTMENT_OTHER): Payer: 59 | Admitting: Internal Medicine

## 2023-01-20 ENCOUNTER — Encounter: Payer: Self-pay | Admitting: Internal Medicine

## 2023-01-20 ENCOUNTER — Ambulatory Visit: Payer: 59 | Admitting: Internal Medicine

## 2023-01-20 DIAGNOSIS — E1169 Type 2 diabetes mellitus with other specified complication: Secondary | ICD-10-CM | POA: Diagnosis not present

## 2023-01-20 DIAGNOSIS — L97522 Non-pressure chronic ulcer of other part of left foot with fat layer exposed: Secondary | ICD-10-CM | POA: Diagnosis not present

## 2023-01-20 DIAGNOSIS — E11621 Type 2 diabetes mellitus with foot ulcer: Secondary | ICD-10-CM | POA: Diagnosis not present

## 2023-01-20 DIAGNOSIS — E1140 Type 2 diabetes mellitus with diabetic neuropathy, unspecified: Secondary | ICD-10-CM | POA: Diagnosis not present

## 2023-01-20 DIAGNOSIS — M86672 Other chronic osteomyelitis, left ankle and foot: Secondary | ICD-10-CM

## 2023-01-20 DIAGNOSIS — L97528 Non-pressure chronic ulcer of other part of left foot with other specified severity: Secondary | ICD-10-CM | POA: Diagnosis not present

## 2023-01-20 LAB — GLUCOSE, CAPILLARY
Glucose-Capillary: 208 mg/dL — ABNORMAL HIGH (ref 70–99)
Glucose-Capillary: 177 mg/dL — ABNORMAL HIGH (ref 70–99)

## 2023-01-20 NOTE — Progress Notes (Signed)
JABRAYLON, TKACZYK ([id number]) [id number] Page 1 of 2 Visit Report for 01/20/2023 HBO Details Patient Name: Date of Service: [name], [name] 01/20/2023 8:00 A M Medical Record Number: [id number] Patient Account Number: [id number] Date of Birth/Sex: Treating RN: [birth year] (42 y.o. 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 HBO Treatment Details Treatment Number: 14 Patient Type: Outpatient Chamber Type: Monoplace Chamber Serial #: [id number] Treatment Protocol: 2.0 ATA with 90 minutes oxygen, with two 5 minute air breaks Treatment Details Compression Rate Down: 1.5 psi / minute De-Compression Rate Up: 1.5 psi / minute A breaks and breathing ir Compress Tx Pressure periods Decompress Decompress Begins Reached (leave unused spaces Begins Ends blank) Chamber Pressure (ATA 1 2 2 2 2 2  --2 1 ) Clock Time (24 hr) 08:46 08:56 09:28 09:33 10:04 10:09 - - 10:39 10:49 Treatment Length: 123 (minutes) Treatment Segments: 4 Vital Signs Capillary Blood Glucose Reference Range: 80 - 120 mg / dl HBO Diabetic Blood Glucose Intervention Range: <131 mg/dl or >782 mg/dl Type: Time Vitals Blood Respiratory Capillary Blood Glucose Pulse Action Pulse: Temperature: Taken: Pressure: Rate: Glucose (mg/dl): Meter #: Oximetry (%) Taken: Pre 08:18 140/96 95 18 97.7 208 treatment initiated Post 10:58 158/88 91 18 97.9 177 released to go home Treatment Response Treatment Toleration: Well Treatment Completion Status: Treatment Completed without Adverse Event HBO Attestation I certify that I supervised this HBO treatment in  accordance with Medicare guidelines. A trained emergency response team is readily available per Yes hospital policies and procedures. Continue HBOT as ordered. Yes Electronic Signature(s) Signed: 01/20/2023 4:35:49 PM By: Geralyn Corwin DO Previous Signature: 01/20/2023 2:40:32 PM Version By: Geralyn Corwin DO Entered By: Geralyn Corwin on 01/20/2023 11:41:20 [name] ([id number]) [id number] Page 2 of 2 -------------------------------------------------------------------------------- HBO Safety Checklist Details Patient Name: Date of Service: [name], [name] 01/20/2023 8:00 A M Medical Record Number: [id number] Patient Account Number: [id number] Date of Birth/Sex: Treating RN: Jul 24, 1980 (42 y.o. M) Primary Care Carmin Alvidrez: [name] Other Clinician: Referring Alistar Mcenery: Treating Tomisha Reppucci/Extender: [name] in Treatment: 16 HBO Safety Checklist Items Safety Checklist Consent Form Signed Patient voided / foley secured and emptied When did you last eato 01/20/2023 Last dose of injectable or oral agent Ostomy pouch emptied and vented if applicable NA All implantable devices assessed, documented and approved NA Intravenous access site secured and place NA Valuables secured Linens and cotton and cotton/polyester blend (less than 51% polyester) Personal oil-based products / skin lotions / body lotions removed Wigs or hairpieces removed NA Smoking or tobacco materials removed Books / newspapers / magazines / loose paper removed Cologne, aftershave, perfume and deodorant removed Jewelry removed (may wrap wedding band) Make-up removed NA Hair care products removed Battery operated devices (external) removed NA Heating patches and chemical warmers removed Titanium eyewear removed NA Nail polish cured greater than 10 hours NA Casting material cured greater than 10 hours NA Hearing aids  removed NA Loose dentures or partials removed NA Prosthetics have been removed NA Patient demonstrates correct use of air break device (if applicable) Patient concerns have been addressed Patient grounding bracelet on and cord attached to chamber Specifics for Inpatients (complete in addition to above) Medication sheet sent with patient Intravenous medications needed or due during therapy sent  with patient Drainage tubes (e.g. nasogastric tube or chest tube secured and vented) Endotracheal or Tracheotomy tube secured Cuff deflated of air and inflated with saline Airway suctioned Electronic Signature(s) Signed: 01/20/2023 4:41:34 PM By: Betha Loa Entered By: Betha Loa on 01/20/2023 10:47:21

## 2023-01-20 NOTE — Progress Notes (Signed)
[name], [name] ([id number]) [id number] Page 1 of 2 Visit Report for 01/20/2023 Arrival Information Details Patient Name: Date of Service: [name], [name] 01/20/2023 8:00 A M Medical Record Number: [id number] Patient Account Number: [id number] Date of Birth/Sex: Treating RN: 1980-10-15 (42 y.o. M) Primary Care Naomee Nowland: [name] Other Clinician: Referring Xylah Early: Treating Cobin Cadavid/Extender: [name] in Treatment: 16 Visit Information History Since Last Visit Added or deleted any medications: No Patient Arrived: Ambulatory Any new allergies or adverse reactions: No Arrival Time: 09:00 Had a fall or experienced change in No Transfer Assistance: None activities of daily living that may affect Patient Identification Verified: Yes risk of falls: Secondary Verification Process Completed: Yes Signs or symptoms of abuse/neglect since last visito No Patient Requires Transmission-Based Precautions: No Hospitalized since last visit: No Patient Has Alerts: Yes Implantable device outside of the clinic excluding No Patient Alerts: Left ABI 1.03 05/13/22 cellular tissue based products placed in the center Right ABI .78 05/13/22 since last visit: Has Dressing in Place as Prescribed: Yes Pain Present Now: No Electronic Signature(s) Signed: 01/20/2023 4:41:34 PM By: Betha Loa Entered By: Betha Loa on 01/20/2023 09:00:49 -------------------------------------------------------------------------------- Encounter Discharge Information Details Patient Name: Date of Service: [name] 01/20/2023 8:00 A M Medical Record Number: [id number] Patient Account Number: [id number] Date of Birth/Sex: Treating RN: 1981-05-04 (42 y.o. M) Primary Care Xiao Graul: [name] Other Clinician: Referring David Rodriquez: Treating Jrue Yambao/Extender: [name] in Treatment: 16 Encounter  Discharge Information Items Discharge Condition: Stable Ambulatory Status: Ambulatory Discharge Destination: Home Transportation: Private Auto Accompanied By: self Schedule Follow-up Appointment: Yes Clinical Summary of Care: BADEN, LEAL ([id number]) [id number] Page 2 of 2 Electronic Signature(s) Signed: 01/20/2023 4:41:34 PM By: Betha Loa Entered By: Betha Loa on 01/20/2023 13:51:06 -------------------------------------------------------------------------------- Vitals Details Patient Name: Date of Service: [name] 01/20/2023 8:00 A M Medical Record Number: [id number] Patient Account Number: [id number] Date of Birth/Sex: Treating RN: 25-May-1981 (42 y.o. M) Primary Care Christeen Lai: [name] Other Clinician: Referring Zarielle Cea: Treating Rembert Browe/Extender: [name] in Treatment: 16 Vital Signs Time Taken: 08:18 Temperature (F): 97.7 Height (in): 73 Pulse (bpm): 95 Weight (lbs): 330 Respiratory Rate (breaths/min): 18 Body Mass Index (BMI): 43.5 Blood Pressure (mmHg): 140/96 Capillary Blood Glucose (mg/dl): 253 Reference Range: 80 - 120 mg / dl Electronic Signature(s) Signed: 01/20/2023 4:41:34 PM By: Betha Loa Entered By: Betha Loa on 01/20/2023 09:01:28

## 2023-01-21 ENCOUNTER — Ambulatory Visit: Payer: 59 | Attending: Internal Medicine | Admitting: Pharmacist

## 2023-01-21 ENCOUNTER — Encounter: Payer: Self-pay | Admitting: Pharmacist

## 2023-01-21 ENCOUNTER — Other Ambulatory Visit (HOSPITAL_COMMUNITY): Payer: Self-pay

## 2023-01-21 ENCOUNTER — Encounter: Payer: 59 | Admitting: Internal Medicine

## 2023-01-21 ENCOUNTER — Other Ambulatory Visit: Payer: Self-pay

## 2023-01-21 DIAGNOSIS — Z7984 Long term (current) use of oral hypoglycemic drugs: Secondary | ICD-10-CM | POA: Diagnosis not present

## 2023-01-21 DIAGNOSIS — E1140 Type 2 diabetes mellitus with diabetic neuropathy, unspecified: Secondary | ICD-10-CM | POA: Diagnosis not present

## 2023-01-21 DIAGNOSIS — E1169 Type 2 diabetes mellitus with other specified complication: Secondary | ICD-10-CM

## 2023-01-21 DIAGNOSIS — M86672 Other chronic osteomyelitis, left ankle and foot: Secondary | ICD-10-CM | POA: Diagnosis not present

## 2023-01-21 DIAGNOSIS — Z794 Long term (current) use of insulin: Secondary | ICD-10-CM

## 2023-01-21 DIAGNOSIS — L97522 Non-pressure chronic ulcer of other part of left foot with fat layer exposed: Secondary | ICD-10-CM | POA: Diagnosis not present

## 2023-01-21 DIAGNOSIS — E11621 Type 2 diabetes mellitus with foot ulcer: Secondary | ICD-10-CM | POA: Diagnosis not present

## 2023-01-21 LAB — GLUCOSE, CAPILLARY
Glucose-Capillary: 253 mg/dL — ABNORMAL HIGH (ref 70–99)
Glucose-Capillary: 201 mg/dL — ABNORMAL HIGH (ref 70–99)

## 2023-01-21 MED ORDER — BASAGLAR KWIKPEN 100 UNIT/ML ~~LOC~~ SOPN
60.0000 [IU] | PEN_INJECTOR | Freq: Every day | SUBCUTANEOUS | 6 refills | Status: AC
Start: 2023-01-21 — End: ?
  Filled 2023-01-21 (×2): qty 15, 25d supply, fill #0
  Filled 2023-07-21: qty 15, 25d supply, fill #1

## 2023-01-21 NOTE — Progress Notes (Signed)
S:     No chief complaint on file.  42 y.o. male who presents for diabetes evaluation, education, and management.   Patient was referred and last seen by Primary Care Provider, Dr. Laural Benes, on 12/31/22. 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   At last visit w/ Dr. Laural Benes on 12/31/22, A1c was 11.9%. Patient reported being off his DM meds for several months d/t cost & mental health. Reported not checking BG at home, continued dietary indiscretion. [name] was d/c'd (wasn't taking), lantus was restarted at 30 units daily (48 units previously), and Trulicity was restarted at 0.75 mg weekly (fill records indicate never picked up)  Today, Patient arrives in good spirits and presents without any assistance. Patient reports never picking up Trulicity and restarted Basaglar at 50 units in the evening.   Family/Social History:  Fhx: DM, HLD, HTN  Tobacco: never Alcohol: no current use  Current diabetes medications include: metformin XR 500 mg (takes 4 tablets--2000 mg once daily), basaglar 50 units daily in the evening. Current hypertension medications include: none Current hyperlipidemia medications include: pravastatin 40 mg daily  Have you been experiencing any side effects to the medications prescribed? no Do you have any problems obtaining medications due to transportation or finances? YES--Trulicity had a $950 copay Insurance coverage: [employer]  Med Adherence: reports 2-3 missed doses of Basaglar & metformin since 12/31/22.   Patient denies hypoglycemic events.  Reported 2 hour post-meal/random blood sugars: 150-low 200s. Checks at hyperbaric treatment center BID. Mentioned BG must be >132 to be authorized for treatment b/c hyperbaric treatment drops BG by 40-50 points.   Patient reports improvement in hyperglycemia symptoms since beginning  insulin & metformin on 12/31/22 Patient denies nocturia (nighttime urination).  Patient denies neuropathy (nerve pain). Patient reports visual changes. Improved since starting meds Patient reports polydipsia  Patient reported dietary habits: Eats 2-3 meals/day. Wants to lose 75-100 pounds.  Breakfast: breakfast sandwich Lunch: typically light Dinner: biggest meal of day. Whatever family cooks. Mentioned foods like stewed beef, spaghetti, brown rice Snacks: sugar-free jello, freezer pops Drinks: regular pepsi, dr. Lora Havens. Cutting back on sugary drinks  Patient-reported exercise habits: not discussed this visit  O:   ROS  Physical Exam  Lab Results  Component Value Date   HGBA1C 11.9 (A) 12/31/2022   There were no vitals filed for this visit.  Lipid Panel     Component Value Date/Time   CHOL 212 (H) 12/31/2022 1519   TRIG 216 (H) 12/31/2022 1519   HDL 35 (L) 12/31/2022 1519   CHOLHDL 6.1 (H) 12/31/2022 1519   LDLCALC 138 (H) 12/31/2022 1519    Clinical Atherosclerotic Cardiovascular Disease (ASCVD): No  The 10-year ASCVD risk score (Arnett DK, et al., 2019) is: 5.2%   Values used to calculate the score:     Age: 42 years     Sex: Male     Is Non-Hispanic African American: No     Diabetic: Yes     Tobacco smoker: No     Systolic Blood Pressure: 125 mmHg     Is BP treated: No     HDL Cholesterol: 35 mg/dL     Total Cholesterol: 212 mg/dL   Patient is participating in a Managed Medicaid Plan:  No   A/P: Diabetes longstanding currently uncontrolled. Patient is able to verbalize appropriate  hypoglycemia management plan. Medication adherence appears improved. Control is suboptimal due to previous non-adherence, suboptimal med regimen, and reported dietary indiscretion. Hyperglycemia symptoms have improved since restarting insulin & metformin on 12/31/22. Patient restarted basaglar at 50 units daily. Patient interested in CGM, but unable to use during hyperbaric treatment.  Patient ending hyperbaric treatment in 1-2 weeks--readdress CGM use at next visit. Finally, patient wanting to start Trulicity again, but insurance/cost has prevented patient from picking up. Will contact [facility] to assist in med access.  -Increased dose of basal insulin Basaglar(insulin glargine) from 50 to 60 units daily. Counseled to begin administering in the morning instead of at dinner to ensure coverage at dinner time. Counseled to decrease to 55 untis if BG <132 before hyperbaric treatment.   -Continued metformin XR 500 mg tablet, 4 tablets w/ dinner (gets n/v when takes OES, does not eat breakfast often).  -Patient educated on purpose, proper use, and potential adverse effects of insulin.  -Extensively discussed pathophysiology of diabetes, recommended lifestyle interventions, dietary effects on blood sugar control.  -Counseled on s/sx of and management of hypoglycemia.  -Next A1c anticipated 04/02/23.   Written patient instructions provided. Patient verbalized understanding of treatment plan.  Total time in face to face counseling 30 minutes.    Follow-up:  Pharmacist in 1 month  Patient seen with [name], PharmD Candidate [employer] [facility]  [name], PharmD, [place], CPP Clinical Pharmacist [facility] [phone]

## 2023-01-22 ENCOUNTER — Encounter: Payer: 59 | Admitting: Internal Medicine

## 2023-01-22 ENCOUNTER — Other Ambulatory Visit (HOSPITAL_COMMUNITY): Payer: Self-pay

## 2023-01-22 DIAGNOSIS — L97522 Non-pressure chronic ulcer of other part of left foot with fat layer exposed: Secondary | ICD-10-CM | POA: Diagnosis not present

## 2023-01-22 DIAGNOSIS — M86672 Other chronic osteomyelitis, left ankle and foot: Secondary | ICD-10-CM | POA: Diagnosis not present

## 2023-01-22 DIAGNOSIS — E11621 Type 2 diabetes mellitus with foot ulcer: Secondary | ICD-10-CM | POA: Diagnosis not present

## 2023-01-22 DIAGNOSIS — E1140 Type 2 diabetes mellitus with diabetic neuropathy, unspecified: Secondary | ICD-10-CM | POA: Diagnosis not present

## 2023-01-22 DIAGNOSIS — E1169 Type 2 diabetes mellitus with other specified complication: Secondary | ICD-10-CM | POA: Diagnosis not present

## 2023-01-22 LAB — GLUCOSE, CAPILLARY
Glucose-Capillary: 199 mg/dL — ABNORMAL HIGH (ref 70–99)
Glucose-Capillary: 157 mg/dL — ABNORMAL HIGH (ref 70–99)

## 2023-01-22 NOTE — Progress Notes (Signed)
UZZIAH, HUSER ([id number]) [id number] Page 1 of 2 Visit Report for 01/22/2023 HBO Details Patient Name: Date of Service: [name], [name] 01/22/2023 7:30 A M Medical Record Number: [id number] Patient Account Number: [id number] Date of Birth/Sex: Treating RN: 06-01-1980 (42 y.o. 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 HBO Treatment Details Treatment Number: 16 Patient Type: Outpatient Chamber Type: Monoplace Chamber Serial #: [id number] Treatment Protocol: 2.0 ATA with 90 minutes oxygen, with two 5 minute air breaks Treatment Details Compression Rate Down: 1.5 psi / minute De-Compression Rate Up: 1.5 psi / minute A breaks and breathing ir Compress Tx Pressure periods Decompress Decompress Begins Reached (leave unused spaces Begins Ends blank) Chamber Pressure (ATA 1 2 2 2 2 2  --2 1 ) Clock Time (24 hr) 07:59 08:09 08:40 08:45 09:16 09:21 - - 09:51 10:00 Treatment Length: 121 (minutes) Treatment Segments: 4 Vital Signs Capillary Blood Glucose Reference Range: 80 - 120 mg / dl HBO Diabetic Blood Glucose Intervention Range: <131 mg/dl or >956 mg/dl Type: Time Vitals Blood Respiratory Capillary Blood Glucose Pulse Action Pulse: Temperature: Taken: Pressure: Rate: Glucose (mg/dl): Meter #: Oximetry (%) Taken: Pre 07:52 162/96 70 16 97.5 199 99 Post 10:04 152/90 85 16 97.7 157 released to go home Treatment Response Treatment Toleration: Well Treatment Completion Status: Treatment Completed without Adverse Event [place] Notes No concerns with treatment given HBO Attestation I certify  that I supervised this HBO treatment in accordance with Medicare guidelines. A trained emergency response team is readily available per Yes hospital policies and procedures. Continue HBOT as ordered. Yes Electronic Signature(s) Signed: 01/22/2023 2:10:50 PM By: Baltazar Najjar MD Previous Signature: 01/22/2023 10:49:30 AM Version By: Betha Loa Previous Signature: 01/22/2023 9:13:48 AM Version By: Betha Loa Entered By: Baltazar Najjar on 01/22/2023 11:09:27 [name] ([id number]) [id number] Page 2 of 2 -------------------------------------------------------------------------------- HBO Safety Checklist Details Patient Name: Date of Service: KENA, CRIST 01/22/2023 7:30 A M Medical Record Number: [id number] Patient Account Number: [id number] Date of Birth/Sex: Treating RN: [birth year] (42 y.o. M) Primary Care Alisha Bacus: [name] Other Clinician: Referring Matti Minney: Treating Inanna Telford/Extender: [name], [name] in Treatment: 16 HBO Safety Checklist Items Safety Checklist Consent Form Signed Patient voided / foley secured and emptied When did you last eato 01/22/2023 Last dose of injectable or oral agent 01/22/2023 Ostomy pouch emptied and vented if applicable NA All implantable devices assessed, documented and approved NA Intravenous access site secured and place NA Valuables secured Linens and cotton and cotton/polyester blend (less than 51% polyester) Personal oil-based products / skin lotions / body lotions removed Wigs or hairpieces removed NA Smoking or tobacco materials removed Books / newspapers / magazines / loose paper removed Cologne, aftershave, perfume and deodorant removed Jewelry removed (may wrap wedding band) Make-up removed NA Hair care products removed NA Battery operated devices (external) removed NA Heating patches and chemical warmers removed Titanium eyewear  removed NA Nail polish cured greater than 10 hours NA Casting material cured greater than 10 hours NA Hearing aids removed NA Loose dentures or partials removed NA Prosthetics have been removed NA Patient demonstrates correct use of air break device (if applicable) Patient concerns have been addressed Patient grounding bracelet on and cord  attached to chamber Specifics for Inpatients (complete in addition to above) Medication sheet sent with patient Intravenous medications needed or due during therapy sent with patient Drainage tubes (e.g. nasogastric tube or chest tube secured and vented) Endotracheal or Tracheotomy tube secured Cuff deflated of air and inflated with saline Airway suctioned Electronic Signature(s) Signed: 01/22/2023 10:49:30 AM By: Betha Loa Previous Signature: 01/22/2023 9:13:48 AM Version By: Betha Loa Entered By: Betha Loa on 01/22/2023 07:42:22

## 2023-01-22 NOTE — Progress Notes (Signed)
[name], [name] ([id number]) [id number] Page 1 of 9 Visit Report for 01/13/2023 Arrival Information Details Patient Name: Date of Service: SAFWAAN, GUIBORD 01/13/2023 1:15 PM Medical Record Number: [id number] Patient Account Number: [id number] Date of Birth/Sex: Treating RN: 07/20/1980 (42 y.o. [name]) [name] Primary Care Anthony Roland: [name] Other Clinician: Betha Loa Referring Brihany Butch: Treating Breya Cass/Extender: [name] in Treatment: 15 Visit Information History Since Last Visit Added or deleted any medications: No Patient Arrived: Ambulatory Any new allergies or adverse reactions: No Arrival Time: 13:22 Had a fall or experienced change in No Accompanied By: self activities of daily living that may affect Transfer Assistance: None risk of falls: Patient Identification Verified: Yes Signs or symptoms of abuse/neglect since last visito No Secondary Verification Process Completed: Yes Hospitalized since last visit: No Patient Requires Transmission-Based Precautions: No Implantable device outside of the clinic excluding No Patient Has Alerts: Yes cellular tissue based products placed in the center Patient Alerts: Left ABI 1.03 05/13/22 since last visit: Right ABI .78 05/13/22 Has Dressing in Place as Prescribed: Yes Pain Present Now: No Electronic Signature(s) Signed: 01/22/2023 11:59:03 AM By: [name] RN Entered By: [name] on 01/13/2023 10:24:12 -------------------------------------------------------------------------------- Clinic Level of Care Assessment Details Patient Name: Date of Service: [name], [name] 01/13/2023 1:15 PM Medical Record Number: [id number] Patient Account Number: [id number] Date of Birth/Sex: Treating RN: January 13, 1981 (42 y.o. [name] Primary Care Donte Kary: [name] Other Clinician: Betha Loa Referring Mica Ramdass: Treating Eirene Rather/Extender:  [name] in Treatment: 15 Clinic Level of Care Assessment Items TOOL 1 Quantity Score []  - 0 Use when EandM and Procedure is performed on INITIAL visit ASSESSMENTS - Nursing Assessment / Reassessment []  - 0 General Physical Exam (combine w/ comprehensive assessment (listed just below) when performed on new pt. evals) []  - 0 Comprehensive Assessment (HX, ROS, Risk Assessments, Wounds Hx, etc.) PAULINE, MITRO ([id number]) [phone].pdf Page 2 of 9 ASSESSMENTS - Wound and Skin Assessment / Reassessment []  - 0 Dermatologic / Skin Assessment (not related to wound area) ASSESSMENTS - Ostomy and/or Continence Assessment and Care []  - 0 Incontinence Assessment and Management []  - 0 Ostomy Care Assessment and Management (repouching, etc.) PROCESS - Coordination of Care []  - 0 Simple Patient / Family Education for ongoing care []  - 0 Complex (extensive) Patient / Family Education for ongoing care []  - 0 Staff obtains Chiropractor, Records, T Results / Process Orders est []  - 0 Staff telephones HHA, Nursing Homes / Clarify orders / etc []  - 0 Routine Transfer to another Facility (non-emergent condition) []  - 0 Routine Hospital Admission (non-emergent condition) []  - 0 New Admissions / Manufacturing engineer / Ordering NPWT Apligraf, etc. , []  - 0 Emergency Hospital Admission (emergent condition) PROCESS - Special Needs []  - 0 Pediatric / Minor Patient Management []  - 0 Isolation Patient Management []  - 0 Hearing / Language / Visual special needs []  - 0 Assessment of Community assistance (transportation, D/C planning, etc.) []  - 0 Additional assistance / Altered mentation []  - 0 Support Surface(s) Assessment (bed, cushion, seat, etc.) INTERVENTIONS - Miscellaneous []  - 0 External ear exam []  - 0 Patient Transfer (multiple staff / Nurse, adult / Similar devices) []  - 0 Simple Staple / Suture removal (25 or less) []   - 0 Complex Staple / Suture removal (26 or more) []  - 0 Hypo/Hyperglycemic Management (do not check if billed separately) []  - 0 Ankle / Brachial Index (ABI) - do not check if billed separately  Has the patient been seen at the hospital within the last three years: Yes Total Score: 0 Level Of Care: ____ Electronic Signature(s) Signed: 01/22/2023 11:59:03 AM By: [name] RN Entered By: [name] on 01/13/2023 10:36:05 -------------------------------------------------------------------------------- Encounter Discharge Information Details Patient Name: Date of Service: [name], [name] 01/13/2023 1:15 PM Medical Record Number: [id number] Patient Account Number: [id number] Date of Birth/Sex: Treating RN: 25-Feb-1981 (42 y.o. [name] Primary Care Basha Krygier: [name] Other Clinician: Betha Loa Referring Talonda Artist: Treating Danity Schmelzer/Extender: [name] in Treatment: [address] [place] C ([id number]) [id number] Page 3 of 9 Encounter Discharge Information Items Post Procedure Vitals Discharge Condition: Stable Temperature (F): 98 Ambulatory Status: Ambulatory Pulse (bpm): 116 Discharge Destination: Home Respiratory Rate (breaths/min): 16 Transportation: Private Auto Blood Pressure (mmHg): 130/80 Accompanied By: self Schedule Follow-up Appointment: Yes Clinical Summary of Care: Electronic Signature(s) Signed: 01/22/2023 11:59:03 AM By: [name] RN Entered By: [name] on 01/13/2023 10:37:06 -------------------------------------------------------------------------------- Lower Extremity Assessment Details Patient Name: Date of Service: [name], [name] 01/13/2023 1:15 PM Medical Record Number: [id number] Patient Account Number: [id number] Date of Birth/Sex: Treating RN: 05-29-1980 (42 y.o. [name] Primary Care Yulissa Needham: [name] Other Clinician: Betha Loa Referring  Trev Boley: Treating Kimie Pidcock/Extender: [name] in Treatment: 15 Edema Assessment Assessed: Kyra Searles: No] [Right: No] Edema: [Left: Ye] [Right: s] Calf Left: Right: Point of Measurement: 40 cm From Medial Instep 44 cm Ankle Left: Right: Point of Measurement: 12 cm From Medial Instep 27 cm Vascular Assessment Pulses: Dorsalis Pedis Palpable: [Left:Yes] Extremity colors, hair growth, and conditions: Extremity Color: [Left:Normal] Hair Growth on Extremity: [Left:No] Temperature of Extremity: [Left:Warm] Capillary Refill: [Left:< 3 seconds] Dependent Rubor: [Left:No] Blanched when Elevated: [Left:No No] Toe Nail Assessment Left: Right: Thick: No Discolored: No Deformed: No Improper Length and Hygiene: No Electronic Signature(sMILLION, GIANNAKOPOULOS ([id number]) [id number] Page 4 of 9 Signed: 01/22/2023 11:59:03 AM By: [name] RN Entered By: [name] on 01/13/2023 10:30:51 -------------------------------------------------------------------------------- Multi Wound Chart Details Patient Name: Date of Service: RAYSHOD, WALTMAN 01/13/2023 1:15 PM Medical Record Number: [id number] Patient Account Number: [id number] Date of Birth/Sex: Treating RN: June 04, 1980 (42 y.o. [name]) [name] Primary Care Dnyla Antonetti: [name] Other Clinician: Betha Loa Referring Kyliana Standen: Treating Minard Millirons/Extender: [name] in Treatment: 15 Vital Signs Height(in): 73 Pulse(bpm): 116 Weight(lbs): 330 Blood Pressure(mmHg): 130/80 Body Mass Index(BMI): 43.5 Temperature(F): 98 Respiratory Rate(breaths/min): 16 [10:Photos:] [N/A:N/A] Left Amputation Site - Toe Left, Lateral Metatarsal head fifth N/A Wound Location: Shear/Friction Blister N/A Wounding Event: Diabetic Wound/Ulcer of the Lower Diabetic Wound/Ulcer of the Lower N/A Primary Etiology: Extremity Extremity Hypertension, Peripheral  Venous Hypertension, Peripheral Venous N/A Comorbid History: Disease, Type II Diabetes, Disease, Type II Diabetes, Osteomyelitis Osteomyelitis 11/18/2022 10/07/2022 N/A Date Acquired: 8 12 N/A Weeks of Treatment: Open Open N/A Wound Status: No No N/A Wound Recurrence: 0.5x0.6x0.1 0.7x0.4x0.4 N/A Measurements L x W x D (cm) 0.236 0.22 N/A A (cm) : rea 0.024 0.088 N/A Volume (cm) : 24.80% 94.90% N/A % Reduction in A rea: 74.50% 89.80% N/A % Reduction in Volume: Grade 1 Grade 3 N/A Classification: Medium Medium N/A Exudate A mount: Serosanguineous Serosanguineous N/A Exudate Type: red, brown red, brown N/A Exudate Color: N/A Flat and Intact N/A Wound Margin: Large (67-100%) Medium (34-66%) N/A Granulation A mount: Red, Pink Red, Pink N/A Granulation Quality: Small (1-33%) Medium (34-66%) N/A Necrotic A mount: Fat Layer (Subcutaneous Tissue): Yes Fat Layer (Subcutaneous Tissue): Yes N/A Exposed Structures: Fascia: No Tendon: Yes  Tendon: No Fascia: No Muscle: No Muscle: No Joint: No Joint: No Bone: No Bone: No None None N/A Epithelialization: Treatment Notes ZACHARIAH, STROUD ([id number]) [id number] Page 5 of 9 Electronic Signature(s) Signed: 01/22/2023 11:59:03 AM By: [name] RN Entered By: [name] on 01/13/2023 10:30:56 -------------------------------------------------------------------------------- Multi-Disciplinary Care Plan Details Patient Name: Date of Service: ZALMAN, GANSER 01/13/2023 1:15 PM Medical Record Number: [id number] Patient Account Number: [id number] Date of Birth/Sex: Treating RN: 1981-04-13 (41 y.o. [name] Primary Care Kailen Hinkle: [name] Other Clinician: Betha Loa Referring Darl Kuss: Treating Dylann Gallier/Extender: [name] in Treatment: 15 Active Inactive Necrotic Tissue Nursing Diagnoses: Knowledge deficit related to management of  necrotic/devitalized tissue Goals: Necrotic/devitalized tissue will be minimized in the wound bed Date Initiated: 09/30/2022 Target Resolution Date: 01/15/2023 Goal Status: Active Interventions: Assess patient pain level pre-, during and post procedure and prior to discharge Notes: Wound/Skin Impairment Nursing Diagnoses: Knowledge deficit related to ulceration/compromised skin integrity Goals: Patient/caregiver will verbalize understanding of skin care regimen Date Initiated: 09/30/2022 Date Inactivated: 12/02/2022 Target Resolution Date: 10/31/2022 Goal Status: Met Ulcer/skin breakdown will have a volume reduction of 30% by week 4 Date Initiated: 09/30/2022 Date Inactivated: 12/02/2022 Target Resolution Date: 10/31/2022 Goal Status: Unmet Unmet Reason: continue care Ulcer/skin breakdown will have a volume reduction of 50% by week 8 Date Initiated: 09/30/2022 Date Inactivated: 12/09/2022 Target Resolution Date: 11/30/2022 Goal Status: Met Ulcer/skin breakdown will have a volume reduction of 80% by week 12 Date Initiated: 09/30/2022 Date Inactivated: 01/13/2023 Target Resolution Date: 12/31/2022 Goal Status: Unmet Unmet Reason: comorbidities Ulcer/skin breakdown will heal within 14 weeks Date Initiated: 09/30/2022 Target Resolution Date: 01/31/2023 Goal Status: Active Interventions: Assess patient/caregiver ability to obtain necessary supplies Assess patient/caregiver ability to perform ulcer/skin care regimen upon admission and as needed Assess ulceration(s) every visit Notes: Electronic Signature(s) SIMAO, SAPUTO ([id number]) [id number] Page 6 of 9 Signed: 01/22/2023 11:59:03 AM By: [name] RN Entered By: [name] on 01/13/2023 10:31:29 -------------------------------------------------------------------------------- Pain Assessment Details Patient Name: Date of Service: ADARIAN, GOTWALT 01/13/2023 1:15 PM Medical Record Number:  [id number] Patient Account Number: [id number] Date of Birth/Sex: Treating RN: 1981-05-12 (41 y.o. [name] Primary Care Coe Angelos: [name] Other Clinician: Betha Loa Referring Bernece Gall: Treating Zoraida Havrilla/Extender: [name] in Treatment: 15 Active Problems Location of Pain Severity and Description of Pain Patient Has Paino No Site Locations Pain Management and Medication Current Pain Management: Electronic Signature(s) Signed: 01/22/2023 11:59:03 AM By: [name] RN Entered By: [name] on 01/13/2023 10:26:02 -------------------------------------------------------------------------------- Patient/Caregiver Education Details Patient Name: Date of Service: [name] 8/21/2024andnbsp1:15 PM Medical Record Number: [id number] Patient Account Number: [id number] MICA, KOWALICK ([id number]) [id number] Page 7 of 9 Date of Birth/Gender: Treating RN: May 09, 1981 (41 y.o. [name] Primary Care Physician: [name] Other Clinician: Betha Loa Referring Physician: Treating Physician/Extender: [name] in Treatment: 15 Education Assessment Education Provided To: Patient Education Topics Provided Wound/Skin Impairment: Handouts: Caring for Your Ulcer Methods: Explain/Verbal Responses: State content correctly Electronic Signature(s) Signed: 01/22/2023 11:59:03 AM By: [name] RN Entered By: [name] on 01/13/2023 10:32:59 -------------------------------------------------------------------------------- Wound Assessment Details Patient Name: Date of Service: TIAN, DOUDS 01/13/2023 1:15 PM Medical Record Number: [id number] Patient Account Number: [id number] Date of Birth/Sex: Treating RN: 05-20-1981 (41 y.o. [name] Primary Care Cheng Dec: [name] Other Clinician: Betha Loa Referring Rivky Clendenning: Treating  Avantika Shere/Extender: [name] in Treatment: 15 Wound Status Wound Number: 10 Primary Diabetic Wound/Ulcer  of the Lower Extremity Etiology: Wound Location: Left Amputation Site - Toe Wound Status: Open Wounding Event: Shear/Friction Comorbid Hypertension, Peripheral Venous Disease, Type II Diabetes, Date Acquired: 11/18/2022 History: Osteomyelitis Weeks Of Treatment: 8 Clustered Wound: No Photos Wound Measurements Length: (cm) 0.5 Width: (cm) 0.6 Depth: (cm) 0.1 Area: (cm) 0.236 Volume: (cm) 0.024 Callegari, Kionte C ([id number]) Wound Description Classification: Grade 1 Exudate Amount: Medium Exudate Type: Serosanguineous Exudate Color: red, brown Foul Odor After Cleansing: No Slough/Fibrino Yes % Reduction in Area: 24.8% % Reduction in Volume: 74.5% Epithelialization: None Tunneling: No Undermining: No [id number] Page 8 of 9 Wound Bed Granulation Amount: Large (67-100%) Exposed Structure Granulation Quality: Red, Pink Fascia Exposed: No Necrotic Amount: Small (1-33%) Fat Layer (Subcutaneous Tissue) Exposed: Yes Necrotic Quality: Adherent Slough Tendon Exposed: No Muscle Exposed: No Joint Exposed: No Bone Exposed: No Electronic Signature(s) Signed: 01/22/2023 11:59:03 AM By: [name] RN Entered By: [name] on 01/13/2023 10:29:48 -------------------------------------------------------------------------------- Wound Assessment Details Patient Name: Date of Service: WALI, HAMACHER 01/13/2023 1:15 PM Medical Record Number: [id number] Patient Account Number: [id number] Date of Birth/Sex: Treating RN: 1980/11/27 (42 y.o. [name]) [name] Primary Care Marshelle Bilger: [name] Other Clinician: Betha Loa Referring Napolean Sia: Treating Ifeoluwa Beller/Extender: [name] in Treatment: 15 Wound Status Wound Number: 9 Primary Diabetic Wound/Ulcer of the Lower  Extremity Etiology: Wound Location: Left, Lateral Metatarsal head fifth Wound Status: Open Wounding Event: Blister Comorbid Hypertension, Peripheral Venous Disease, Type II Diabetes, Date Acquired: 10/07/2022 History: Osteomyelitis Weeks Of Treatment: 12 Clustered Wound: No Photos Wound Measurements Length: (cm) 0.7 Width: (cm) 0.4 Depth: (cm) 0.4 Area: (cm) 0.22 Volume: (cm) 0.088 % Reduction in Area: 94.9% % Reduction in Volume: 89.8% Epithelialization: None Tunneling: No Undermining: No Wound Description Classification: Grade 3 Domingo, Tarren C ([id number]) Wound Margin: Flat and Intact Exudate Amount: Medium Exudate Type: Serosanguineous Exudate Color: red, brown Foul Odor After Cleansing: No [id number] Page 9 of 9 Slough/Fibrino Yes Wound Bed Granulation Amount: Medium (34-66%) Exposed Structure Granulation Quality: Red, Pink Fascia Exposed: No Necrotic Amount: Medium (34-66%) Fat Layer (Subcutaneous Tissue) Exposed: Yes Necrotic Quality: Adherent Slough Tendon Exposed: Yes Muscle Exposed: No Joint Exposed: No Bone Exposed: No Electronic Signature(s) Signed: 01/22/2023 11:59:03 AM By: [name] RN Entered By: [name] on 01/13/2023 10:30:08 -------------------------------------------------------------------------------- Vitals Details Patient Name: Date of Service: [name] 01/13/2023 1:15 PM Medical Record Number: [id number] Patient Account Number: [id number] Date of Birth/Sex: Treating RN: 1980-07-01 (42 y.o. [name]) [name] Primary Care Jarrett Albor: [name] Other Clinician: Betha Loa Referring Averly Ericson: Treating Jaxtin Raimondo/Extender: [name] in Treatment: 15 Vital Signs Time Taken: 13:25 Temperature (F): 98 Height (in): 73 Pulse (bpm): 116 Weight (lbs): 330 Respiratory Rate (breaths/min): 16 Body Mass Index (BMI): 43.5 Blood Pressure (mmHg): 130/80 Reference  Range: 80 - 120 mg / dl Electronic Signature(s) Signed: 01/22/2023 11:59:03 AM By: [name] RN Entered By: [name] on 01/13/2023 10:25:53

## 2023-01-22 NOTE — Progress Notes (Signed)
[name], [name] ([id number]) [id number] Page 1 of 2 Visit Report for 01/21/2023 Arrival Information Details Patient Name: Date of Service: [name], [name] 01/21/2023 7:30 A M Medical Record Number: [id number] Patient Account Number: [id number] Date of Birth/Sex: Treating RN: [birth year] (42 y.o. M) Primary Care Yovani Cogburn: [name] Other Clinician: Referring Madigan Rosensteel: Treating Oceane Fosse/Extender: [name], [name] in Treatment: 16 Visit Information History Since Last Visit All ordered tests and consults were completed: No Patient Arrived: Ambulatory Added or deleted any medications: No Arrival Time: 08:09 Any new allergies or adverse reactions: No Transfer Assistance: None Had a fall or experienced change in No Patient Identification Verified: Yes activities of daily living that may affect Secondary Verification Process Completed: Yes risk of falls: Patient Requires Transmission-Based Precautions: No Signs or symptoms of abuse/neglect since last visito No Patient Has Alerts: Yes Hospitalized since last visit: No Patient Alerts: Left ABI 1.03 05/13/22 Implantable device outside of the clinic excluding No Right ABI .78 05/13/22 cellular tissue based products placed in the center since last visit: Has Dressing in Place as Prescribed: Yes Pain Present Now: No Electronic Signature(s) Signed: 01/22/2023 9:13:48 AM By: Betha Loa Entered By: Betha Loa on 01/21/2023 05:12:34 -------------------------------------------------------------------------------- Encounter Discharge Information Details Patient Name: Date of Service: [name] 01/21/2023 7:30 A M Medical Record Number: [id number] Patient Account Number: [id number] Date of Birth/Sex: Treating RN: Dec 16, 1980 (42 y.o. M) Primary Care Daizee Firmin: [name] Other Clinician: Referring Utah Delauder: Treating Luvina Poirier/Extender: [name], [name]  [name] in Treatment: 16 Encounter Discharge Information Items Discharge Condition: Stable Ambulatory Status: Ambulatory Discharge Destination: Home Transportation: Private Auto Accompanied By: self Schedule Follow-up Appointment: Yes Clinical Summary of Care: SEMAJAY, SINGLE ([id number]) [id number] Page 2 of 2 Electronic Signature(s) Signed: 01/22/2023 9:13:48 AM By: Betha Loa Entered By: Betha Loa on 01/21/2023 08:27:14 -------------------------------------------------------------------------------- Vitals Details Patient Name: Date of Service: [name] 01/21/2023 7:30 A M Medical Record Number: [id number] Patient Account Number: [id number] Date of Birth/Sex: Treating RN: [birth year] (42 y.o. M) Primary Care Niesha Bame: [name] Other Clinician: Referring Brooklynne Pereida: Treating Jaleeya Mcnelly/Extender: [name], [name] in Treatment: 16 Vital Signs Time Taken: 07:54 Temperature (F): 97.8 Height (in): 73 Pulse (bpm): 99 Weight (lbs): 330 Respiratory Rate (breaths/min): 16 Body Mass Index (BMI): 43.5 Blood Pressure (mmHg): 144/90 Capillary Blood Glucose (mg/dl): 244 Reference Range: 80 - 120 mg / dl Airway Pulse Oximetry (%): 99 Electronic Signature(s) Signed: 01/22/2023 9:13:48 AM By: Betha Loa Entered By: Betha Loa on 01/21/2023 05:14:02

## 2023-01-22 NOTE — Progress Notes (Signed)
WAIN, MURANO ([id number]) [id number] Page 1 of 2 Visit Report for 01/21/2023 HBO Details Patient Name: Date of Service: [name], [name] 01/21/2023 7:30 A M Medical Record Number: [id number] Patient Account Number: [id number] Date of Birth/Sex: Treating RN: 12-28-1980 (42 y.o. 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 HBO Treatment Details Treatment Number: 15 Patient Type: Outpatient Chamber Type: Monoplace Chamber Serial #: [id number] Treatment Protocol: 2.0 ATA with 90 minutes oxygen, with two 5 minute air breaks Treatment Details Compression Rate Down: 1.5 psi / minute De-Compression Rate Up: 1.5 psi / minute A breaks and breathing ir Compress Tx Pressure periods Decompress Decompress Begins Reached (leave unused spaces Begins Ends blank) Chamber Pressure (ATA 1 2 2 2 2 2  --2 1 ) Clock Time (24 hr) 08:03 08:13 08:44 08:49 09:19 09:24 - - 09:54 10:05 Treatment Length: 122 (minutes) Treatment Segments: 4 Vital Signs Capillary Blood Glucose Reference Range: 80 - 120 mg / dl HBO Diabetic Blood Glucose Intervention Range: <131 mg/dl or >782 mg/dl Type: Time Vitals Blood Respiratory Capillary Blood Glucose Pulse Action Pulse: Temperature: Taken: Pressure: Rate: Glucose (mg/dl): Meter #: Oximetry (%) Taken: Pre 07:54 144/90 99 16 97.8 253 99 treatment initiated Post 10:15 154/92 82 16 97.6 201 released to go home Treatment Response Treatment Toleration: Well Treatment Completion Status: Treatment Completed without Adverse Event Trevonte Ashkar Notes No concerns with treatment given HBO  Attestation I certify that I supervised this HBO treatment in accordance with Medicare guidelines. A trained emergency response team is readily available per Yes hospital policies and procedures. Continue HBOT as ordered. Yes Electronic Signature(s) Signed: 01/21/2023 5:20:34 PM By: Baltazar Najjar MD Entered By: Baltazar Najjar on 01/21/2023 14:19:07 [name] ([id number]) [id number] Page 2 of 2 -------------------------------------------------------------------------------- HBO Safety Checklist Details Patient Name: Date of Service: [name], [name] 01/21/2023 7:30 A M Medical Record Number: [id number] Patient Account Number: [id number] Date of Birth/Sex: Treating RN: [birth year] (42 y.o. M) Primary Care Klara Stjames: [name] Other Clinician: Referring Joana Nolton: Treating Akiko Schexnider/Extender: [name], [name] in Treatment: 16 HBO Safety Checklist Items Safety Checklist Consent Form Signed Patient voided / foley secured and emptied When did you last eato 01/20/2023 Last dose of injectable or oral agent 01/20/2023 Ostomy pouch emptied and vented if applicable NA All implantable devices assessed, documented and approved NA Intravenous access site secured and place NA Valuables secured Linens and cotton and cotton/polyester blend (less than 51% polyester) Personal oil-based products / skin lotions / body lotions removed Wigs or hairpieces removed NA Smoking or tobacco materials removed Books / newspapers / magazines / loose paper removed Cologne, aftershave, perfume and deodorant removed Jewelry removed (may wrap wedding band) Make-up removed NA Hair care products removed Battery operated devices (external) removed NA Heating patches and chemical warmers removed Titanium eyewear removed NA Nail polish cured greater than 10 hours NA Casting material cured greater than 10 hours NA Hearing aids  removed NA Loose dentures or partials removed NA Prosthetics have been removed NA Patient demonstrates correct use of air break device (if applicable) Patient concerns have been addressed Patient grounding bracelet on and cord attached to chamber Specifics for Inpatients (complete in addition to above) Medication sheet sent with patient Intravenous  medications needed or due during therapy sent with patient Drainage tubes (e.g. nasogastric tube or chest tube secured and vented) Endotracheal or Tracheotomy tube secured Cuff deflated of air and inflated with saline Airway suctioned Electronic Signature(s) Signed: 01/22/2023 9:13:48 AM By: Betha Loa Entered By: Betha Loa on 01/21/2023 05:15:17

## 2023-01-22 NOTE — Progress Notes (Signed)
ELMOR, BONFANTE ([id number]) [id number] Page 1 of 10 Visit Report for 01/20/2023 Arrival Information Details Patient Name: Date of Service: [name], [name] 01/20/2023 1:00 PM Medical Record Number: [id number] Patient Account Number: [id number] Date of Birth/Sex: Treating RN: June 20, 1980 (42 y.o. [name]) [name] Primary Care [name]: [name] Other Clinician: Betha Loa Referring Myya Meenach: Treating Lanisa Ishler/Extender: [name] in Treatment: 16 Visit Information History Since Last Visit All ordered tests and consults were completed: No Patient Arrived: Ambulatory Added or deleted any medications: No Arrival Time: 13:13 Any new allergies or adverse reactions: No Transfer Assistance: None Had a fall or experienced change in No Patient Identification Verified: Yes activities of daily living that may affect Secondary Verification Process Completed: Yes risk of falls: Patient Requires Transmission-Based Precautions: No Signs or symptoms of abuse/neglect since No Patient Has Alerts: Yes last visito Patient Alerts: Left ABI 1.03 05/13/22 Hospitalized since last visit: No Right ABI .78 05/13/22 Implantable device outside of the clinic No excluding cellular tissue based products placed in the center since last visit: Has Dressing in Place as Prescribed: Yes Has Footwear/Offloading in Place as Yes Prescribed: Left: Surgical Shoe with Pressure Relief Insole Pain Present Now: No Electronic Signature(s) Signed: 01/20/2023 4:41:34 PM By: Betha Loa Entered By: Betha Loa on 01/20/2023 10:15:38 -------------------------------------------------------------------------------- Clinic Level of Care Assessment Details Patient Name: Date of Service: [name], [name] 01/20/2023 1:00 PM Medical Record Number: [id number] Patient Account Number: [id number] Date of Birth/Sex: Treating RN: [birth year] (42 y.o.  [name] Primary Care Rettie Laird: [name] Other Clinician: Betha Loa Referring Sidi Dzikowski: Treating Jasyah Theurer/Extender: [name] in Treatment: 16 Clinic Level of Care Assessment Items TOOL 1 Quantity Score ENDY, MAZZAFERRO ([id number]) [phone].pdf Page 2 of 10 []  - 0 Use when EandM and Procedure is performed on INITIAL visit ASSESSMENTS - Nursing Assessment / Reassessment []  - 0 General Physical Exam (combine w/ comprehensive assessment (listed just below) when performed on new pt. evals) []  - 0 Comprehensive Assessment (HX, ROS, Risk Assessments, Wounds Hx, etc.) ASSESSMENTS - Wound and Skin Assessment / Reassessment []  - 0 Dermatologic / Skin Assessment (not related to wound area) ASSESSMENTS - Ostomy and/or Continence Assessment and Care []  - 0 Incontinence Assessment and Management []  - 0 Ostomy Care Assessment and Management (repouching, etc.) PROCESS - Coordination of Care []  - 0 Simple Patient / Family Education for ongoing care []  - 0 Complex (extensive) Patient / Family Education for ongoing care []  - 0 Staff obtains Chiropractor, Records, T Results / Process Orders est []  - 0 Staff telephones HHA, Nursing Homes / Clarify orders / etc []  - 0 Routine Transfer to another Facility (non-emergent condition) []  - 0 Routine Hospital Admission (non-emergent condition) []  - 0 New Admissions / Manufacturing engineer / Ordering NPWT Apligraf, etc. , []  - 0 Emergency Hospital Admission (emergent condition) PROCESS - Special Needs []  - 0 Pediatric / Minor Patient Management []  - 0 Isolation Patient Management []  - 0 Hearing / Language / Visual special needs []  - 0 Assessment of Community assistance (transportation, D/C planning, etc.) []  - 0 Additional assistance / Altered mentation []  - 0 Support Surface(s) Assessment (bed, cushion, seat, etc.) INTERVENTIONS - Miscellaneous []  -  0 External ear exam []  - 0 Patient Transfer (multiple staff / Nurse, adult / Similar devices) []  - 0 Simple Staple / Suture removal (25 or less) []  - 0 Complex Staple / Suture removal (26 or more) []  - 0 Hypo/Hyperglycemic Management (do not check  if billed separately) []  - 0 Ankle / Brachial Index (ABI) - do not check if billed separately Has the patient been seen at the hospital within the last three years: Yes Total Score: 0 Level Of Care: ____ Electronic Signature(s) Signed: 01/20/2023 4:41:34 PM By: Betha Loa Entered By: Betha Loa on 01/20/2023 10:37:16 Encounter Discharge Information Details -------------------------------------------------------------------------------- [name] ([id number]) [id number] Page 3 of 10 Patient Name: Date of Service: [name], [name] 01/20/2023 1:00 PM Medical Record Number: [id number] Patient Account Number: [id number] Date of Birth/Sex: Treating RN: 13-Mar-1981 (42 y.o. [name]) [name] Primary Care [name]: [name] Other Clinician: Betha Loa Referring Xochitl Egle: Treating Jaysha Lasure/Extender: [name] in Treatment: 16 Encounter Discharge Information Items Post Procedure Vitals Discharge Condition: Stable Temperature (F): 97.7 Ambulatory Status: Ambulatory Pulse (bpm): 95 Discharge Destination: Home Respiratory Rate (breaths/min): 18 Transportation: Private Auto Blood Pressure (mmHg): 140/96 Accompanied By: self Schedule Follow-up Appointment: Yes Clinical Summary of Care: Electronic Signature(s) Signed: 01/20/2023 4:41:34 PM By: Betha Loa Entered By: Betha Loa on 01/20/2023 10:56:16 -------------------------------------------------------------------------------- Lower Extremity Assessment Details Patient Name: Date of Service: [name], [name] 01/20/2023 1:00 PM Medical Record Number: [id number] Patient Account Number:  [id number] Date of Birth/Sex: Treating RN: 25-Apr-1981 (42 y.o. [name] Primary Care [name]: [name] Other Clinician: Betha Loa Referring Garhett Bernhard: Treating Antawan Mchugh/Extender: [name] in Treatment: 16 Edema Assessment Assessed: Kyra Searles: Yes] [Right: No] Edema: [Left: Ye] [Right: s] Calf Left: Right: Point of Measurement: 40 cm From Medial Instep 44 cm Ankle Left: Right: Point of Measurement: 12 cm From Medial Instep 26.5 cm Vascular Assessment Pulses: Dorsalis Pedis Palpable: [Left:Yes] Electronic Signature(s) Signed: 01/20/2023 4:41:34 PM By: Betha Loa Signed: 01/22/2023 11:58:03 AM By: [name] RN Entered By: Betha Loa on 01/20/2023 10:26:19 [name] [id number] Page 4 of 10 -------------------------------------------------------------------------------- Multi Wound Chart Details Patient Name: Date of Service: [name], [name] 01/20/2023 1:00 PM Medical Record Number: [id number] Patient Account Number: [id number] Date of Birth/Sex: Treating RN: 02-Sep-1980 (41 y.o. [name]) [name] Primary Care Deeanne Deininger: [name] Other Clinician: Betha Loa Referring Pranish Akhavan: Treating Hatcher Froning/Extender: [name] in Treatment: 16 Vital Signs Height(in): 73 Pulse(bpm): 95 Weight(lbs): 330 Blood Pressure(mmHg): 140/96 Body Mass Index(BMI): 43.5 Temperature(F): 97.7 Respiratory Rate(breaths/min): 16 [10:Photos:] [9:No Photos] [N/A:N/A] Left Amputation Site - Toe Left, Lateral Metatarsal head fifth N/A Wound Location: Shear/Friction Blister N/A Wounding Event: Diabetic Wound/Ulcer of the Lower Diabetic Wound/Ulcer of the Lower N/A Primary Etiology: Extremity Extremity Hypertension, Peripheral Venous Hypertension, Peripheral Venous N/A Comorbid History: Disease, Type II Diabetes, Disease, Type II Diabetes, Osteomyelitis  Osteomyelitis 11/18/2022 10/07/2022 N/A Date Acquired: 9 13 N/A Weeks of Treatment: Open Open N/A Wound Status: No No N/A Wound Recurrence: 0.5x0.5x0.3 0.7x0.1x0.6 N/A Measurements L x W x D (cm) 0.196 0.055 N/A A (cm) : rea 0.059 0.033 N/A Volume (cm) : 37.60% 98.70% N/A % Reduction in A rea: 37.20% 96.20% N/A % Reduction in Volume: Grade 1 Grade 3 N/A Classification: Medium Medium N/A Exudate A mount: Serosanguineous Serosanguineous N/A Exudate Type: red, brown red, brown N/A Exudate Color: N/A Flat and Intact N/A Wound Margin: Large (67-100%) Medium (34-66%) N/A Granulation A mount: Red, Pink Red, Pink N/A Granulation Quality: Small (1-33%) Medium (34-66%) N/A Necrotic A mount: Fat Layer (Subcutaneous Tissue): Yes Fat Layer (Subcutaneous Tissue): Yes N/A Exposed Structures: Fascia: No Tendon: Yes Tendon: No Fascia: No Muscle: No Muscle: No Joint: No Joint: No Bone: No Bone: No None None N/A Epithelialization: Treatment Notes  Electronic Signature(s) Signed: 01/20/2023 4:41:34 PM By: Betha Loa Entered By: Betha Loa on 01/20/2023 10:26:26 [name] ([id number]) [id number] Page 5 of 10 -------------------------------------------------------------------------------- Multi-Disciplinary Care Plan Details Patient Name: Date of Service: [name], [name] 01/20/2023 1:00 PM Medical Record Number: [id number] Patient Account Number: [id number] Date of Birth/Sex: Treating RN: [birth year] (41 y.o. [name]) [name] Primary Care Almas Rake: [name] Other Clinician: Betha Loa Referring Kelci Petrella: Treating Ladonya Jerkins/Extender: [name] in Treatment: 16 Active Inactive Necrotic Tissue Nursing Diagnoses: Knowledge deficit related to management of necrotic/devitalized tissue Goals: Necrotic/devitalized tissue will be minimized in the wound bed Date Initiated: 09/30/2022 Target  Resolution Date: 01/15/2023 Goal Status: Active Interventions: Assess patient pain level pre-, during and post procedure and prior to discharge Notes: Wound/Skin Impairment Nursing Diagnoses: Knowledge deficit related to ulceration/compromised skin integrity Goals: Patient/caregiver will verbalize understanding of skin care regimen Date Initiated: 09/30/2022 Date Inactivated: 12/02/2022 Target Resolution Date: 10/31/2022 Goal Status: Met Ulcer/skin breakdown will have a volume reduction of 30% by week 4 Date Initiated: 09/30/2022 Date Inactivated: 12/02/2022 Target Resolution Date: 10/31/2022 Goal Status: Unmet Unmet Reason: continue care Ulcer/skin breakdown will have a volume reduction of 50% by week 8 Date Initiated: 09/30/2022 Date Inactivated: 12/09/2022 Target Resolution Date: 11/30/2022 Goal Status: Met Ulcer/skin breakdown will have a volume reduction of 80% by week 12 Date Initiated: 09/30/2022 Date Inactivated: 01/13/2023 Target Resolution Date: 12/31/2022 Goal Status: Unmet Unmet Reason: comorbidities Ulcer/skin breakdown will heal within 14 weeks Date Initiated: 09/30/2022 Target Resolution Date: 01/31/2023 Goal Status: Active Interventions: Assess patient/caregiver ability to obtain necessary supplies Assess patient/caregiver ability to perform ulcer/skin care regimen upon admission and as needed Assess ulceration(s) every visit Notes: Electronic Signature(s) Signed: 01/20/2023 4:41:34 PM By: Betha Loa Signed: 01/22/2023 11:58:03 AM By: [name] RN Entered By: Betha Loa on 01/20/2023 10:37:27 [name] ([id number]) [id number] Page 6 of 10 -------------------------------------------------------------------------------- Pain Assessment Details Patient Name: Date of Service: [name], [name] 01/20/2023 1:00 PM Medical Record Number: [id number] Patient Account Number: [id number] Date of Birth/Sex: Treating RN: 12-13-1980 (41 y.o.  [name]) [name] Primary Care Ata Pecha: [name] Other Clinician: Betha Loa Referring Libertie Hausler: Treating Jerriann Schrom/Extender: [name] in Treatment: 16 Active Problems Location of Pain Severity and Description of Pain Patient Has Paino No Site Locations Pain Management and Medication Current Pain Management: Electronic Signature(s) Signed: 01/20/2023 4:41:34 PM By: Betha Loa Signed: 01/22/2023 11:58:03 AM By: [name] RN Entered By: Betha Loa on 01/20/2023 10:17:31 -------------------------------------------------------------------------------- Patient/Caregiver Education Details Patient Name: Date of Service: Perkin, Meade C. 8/28/2024andnbsp1:00 PM Medical Record Number: [id number] Patient Account Number: [id number] Date of Birth/Gender: Treating RN: Aug 10, 1980 (41 y.o. Lina Sar, Lyla Son Primary Care Physician: [name] Other Clinician: Betha Loa Referring Physician: Treating Physician/Extender: Raihan, Deragon, Sherian Rein ([id number]) [id number] Page 7 of 10 Weeks in Treatment: 16 Education Assessment Education Provided To: Patient Education Topics Provided Wound/Skin Impairment: Handouts: Other: continue wound care as directed Methods: Explain/Verbal Responses: State content correctly Electronic Signature(s) Signed: 01/20/2023 4:41:34 PM By: Betha Loa Entered By: Betha Loa on 01/20/2023 10:37:44 -------------------------------------------------------------------------------- Wound Assessment Details Patient Name: Date of Service: [name], [name] 01/20/2023 1:00 PM Medical Record Number: [id number] Patient Account Number: [id number] Date of Birth/Sex: Treating RN: October 27, 1980 (41 y.o. [name] Primary Care Natallia Stellmach: [name] Other Clinician: Betha Loa Referring Tramel Westbrook: Treating Izaya Netherton/Extender: [name] in Treatment: 16 Wound Status Wound Number: 10 Primary Diabetic Wound/Ulcer of the Lower Extremity Etiology: Wound Location: Left  Amputation Site - Toe Wound Status: Open Wounding Event: Shear/Friction Comorbid Hypertension, Peripheral Venous Disease, Type II Diabetes, Date Acquired: 11/18/2022 History: Osteomyelitis Weeks Of Treatment: 9 Clustered Wound: No Photos Wound Measurements Length: (cm) 0.5 Width: (cm) 0.5 Depth: (cm) 0.3 Area: (cm) 0.196 Volume: (cm) 0.059 % Reduction in Area: 37.6% % Reduction in Volume: 37.2% Epithelialization: None Wound Description Classification: Grade 1 Nader, Symeon C ([id number]) Exudate Amount: Medium Exudate Type: Serosanguineous Exudate Color: red, brown Foul Odor After Cleansing: No [phone].pdf Page 8 of 10 Slough/Fibrino Yes Wound Bed Granulation Amount: Large (67-100%) Exposed Structure Granulation Quality: Red, Pink Fascia Exposed: No Necrotic Amount: Small (1-33%) Fat Layer (Subcutaneous Tissue) Exposed: Yes Necrotic Quality: Adherent Slough Tendon Exposed: No Muscle Exposed: No Joint Exposed: No Bone Exposed: No Treatment Notes Wound #10 (Amputation Site - Toe) Wound Laterality: Left Cleanser Soap and Water Discharge Instruction: Gently cleanse wound with antibacterial soap, rinse and pat dry prior to dressing wounds Peri-Wound Care Topical Primary Dressing Hydrofera Blue Ready Transfer Foam, 4x5 (in/in) Discharge Instruction: Apply Hydrofera Blue Ready to wound bed as directed Secondary Dressing Kerlix 4.5 x 4.1 (in/yd) Discharge Instruction: Apply Kerlix 4.5 x 4.1 (in/yd) as instructed Secured With Medipore T - 68M Medipore H Soft Cloth Surgical T ape ape, 2x2 (in/yd) Compression Wrap Compression Stockings Add-Ons Electronic Signature(s) Signed: 01/20/2023 4:41:34 PM By: Betha Loa Signed: 01/22/2023 11:58:03 AM By: [name] RN Entered By:  Betha Loa on 01/20/2023 10:24:20 -------------------------------------------------------------------------------- Wound Assessment Details Patient Name: Date of Service: [name] 01/20/2023 1:00 PM Medical Record Number: [id number] Patient Account Number: [id number] Date of Birth/Sex: Treating RN: 1981-03-20 (41 y.o. [name] Primary Care Jalisia Puchalski: [name] Other Clinician: Betha Loa Referring Zaelyn Barbary: Treating Sadira Standard/Extender: [name] in Treatment: 16 Wound Status Wound Number: 9 Primary Diabetic Wound/Ulcer of the Lower Extremity Etiology: Wound Location: Left, Lateral Metatarsal head fifth Wound Status: Open Wounding Event: Blister Comorbid Hypertension, Peripheral Venous Disease, Type II Diabetes, Date Acquired: 10/07/2022 History: Osteomyelitis Weeks Of Treatment: 13 Clustered Wound: No ISSAC, WILCOTT C ([id number]) [phone].pdf Page 9 of 10 Wound Measurements Length: (cm) 0.7 Width: (cm) 0.1 Depth: (cm) 0.6 Area: (cm) 0.055 Volume: (cm) 0.033 % Reduction in Area: 98.7% % Reduction in Volume: 96.2% Epithelialization: None Wound Description Classification: Grade 3 Wound Margin: Flat and Intact Exudate Amount: Medium Exudate Type: Serosanguineous Exudate Color: red, brown Foul Odor After Cleansing: No Slough/Fibrino Yes Wound Bed Granulation Amount: Medium (34-66%) Exposed Structure Granulation Quality: Red, Pink Fascia Exposed: No Necrotic Amount: Medium (34-66%) Fat Layer (Subcutaneous Tissue) Exposed: Yes Necrotic Quality: Adherent Slough Tendon Exposed: Yes Muscle Exposed: No Joint Exposed: No Bone Exposed: No Treatment Notes Wound #9 (Metatarsal head fifth) Wound Laterality: Left, Lateral Cleanser Soap and Water Discharge Instruction: Gently cleanse wound with antibacterial soap, rinse and pat dry prior to dressing wounds Peri-Wound  Care Topical vashe Discharge Instruction: damp to dry daily Primary Dressing Gauze Discharge Instruction: moisten with Vashe and apply to wound Secondary Dressing Kerlix 4.5 x 4.1 (in/yd) Discharge Instruction: Apply Kerlix 4.5 x 4.1 (in/yd) as instructed Secured With Medipore T - 68M Medipore H Soft Cloth Surgical T ape ape, 2x2 (in/yd) Compression Wrap Compression Stockings Add-Ons Electronic Signature(s) Signed: 01/20/2023 4:41:34 PM By: Betha Loa Signed: 01/22/2023 11:58:03 AM By: [name] RN Entered By: Betha Loa on 01/20/2023 10:25:18 Vitals Details -------------------------------------------------------------------------------- [name] ([id number]) [id number] Page 10 of 10 Patient Name: Date of Service: [name], [name] 01/20/2023 1:00 PM Medical Record Number: [id number] Patient  Account Number: [id number] Date of Birth/Sex: Treating RN: [birth year] (41 y.o. [name]) [name] Primary Care Sarann Tregre: [name] Other Clinician: Betha Loa Referring Anatasia Tino: Treating Brenee Gajda/Extender: [name] in Treatment: 16 Vital Signs Time Taken: 13:16 Temperature (F): 97.7 Height (in): 73 Pulse (bpm): 95 Weight (lbs): 330 Respiratory Rate (breaths/min): 16 Body Mass Index (BMI): 43.5 Blood Pressure (mmHg): 140/96 Reference Range: 80 - 120 mg / dl Electronic Signature(s) Signed: 01/20/2023 4:41:34 PM By: Betha Loa Entered By: Betha Loa on 01/20/2023 10:17:25

## 2023-01-22 NOTE — Progress Notes (Addendum)
BARBARA, CALCANO ([id number]) [id number] Page 1 of 2 Visit Report for 01/22/2023 Arrival Information Details Patient Name: Date of Service: [name], [name] 01/22/2023 7:30 A M Medical Record Number: [id number] Patient Account Number: [id number] Date of Birth/Sex: Treating RN: [birth year] (41 y.o. M) Primary Care [name]: [name] Other Clinician: Referring Add Dinapoli: Treating Bayron Dalto/Extender: [name], [name] in Treatment: 16 Visit Information History Since Last Visit Added or deleted any medications: No Patient Arrived: Ambulatory Any new allergies or adverse reactions: No Arrival Time: 07:33 Had a fall or experienced change in No Transfer Assistance: None activities of daily living that may affect Patient Identification Verified: Yes risk of falls: Secondary Verification Process Completed: Yes Signs or symptoms of abuse/neglect since last visito No Patient Requires Transmission-Based Precautions: No Hospitalized since last visit: No Patient Has Alerts: Yes Implantable device outside of the clinic excluding No Patient Alerts: Left ABI 1.03 05/13/22 cellular tissue based products placed in the center Right ABI .78 05/13/22 since last visit: Has Dressing in Place as Prescribed: Yes Pain Present Now: No Electronic Signature(s) Signed: 01/22/2023 9:13:48 AM By: Betha Loa Entered By: Betha Loa on 01/22/2023 05:04:36 -------------------------------------------------------------------------------- Encounter Discharge Information Details Patient Name: Date of Service: [name] 01/22/2023 7:30 A M Medical Record Number: [id number] Patient Account Number: [id number] Date of Birth/Sex: Treating RN: [birth year] (42 y.o. M) Primary Care Tykwon Fera: [name] Other Clinician: Referring Argenis Kumari: Treating Dajahnae Vondra/Extender: [name], [name] in Treatment:  16 Encounter Discharge Information Items Discharge Condition: Stable Ambulatory Status: Ambulatory Discharge Destination: Home Transportation: Private Auto Accompanied By: self Schedule Follow-up Appointment: Yes Clinical Summary of Care: [name], [name] ([id number]) [id number] Page 2 of 2 Electronic Signature(s) Signed: 01/22/2023 10:49:30 AM By: Betha Loa Entered By: Betha Loa on 01/22/2023 07:45:14 -------------------------------------------------------------------------------- Vitals Details Patient Name: Date of Service: [name] 01/22/2023 7:30 A M Medical Record Number: [id number] Patient Account Number: [id number] Date of Birth/Sex: Treating RN: 09-16-1980 (42 y.o. M) Primary Care Shaune Malacara: [name] Other Clinician: Referring Mishael Krysiak: Treating Jayleah Garbers/Extender: [name], [name] in Treatment: 16 Vital Signs Time Taken: 07:52 Temperature (F): 97.5 Height (in): 73 Pulse (bpm): 70 Weight (lbs): 330 Respiratory Rate (breaths/min): 16 Body Mass Index (BMI): 43.5 Blood Pressure (mmHg): 162/96 Capillary Blood Glucose (mg/dl): 034 Reference Range: 80 - 120 mg / dl Airway Pulse Oximetry (%): 99 Inhaled Oxygen Concentration (%): 0 Electronic Signature(s) Signed: 01/22/2023 9:13:48 AM By: Betha Loa Entered By: Betha Loa on 01/22/2023 05:05:22

## 2023-01-25 ENCOUNTER — Encounter: Payer: Self-pay | Admitting: Orthopedic Surgery

## 2023-01-25 NOTE — Progress Notes (Signed)
Office Visit Note   Patient: [name]           Date of Birth: February 02, 1981           MRN: [id number] Visit Date: 01/14/2023              Requested by: Marcine Matar, MD [address] [place],  [place] [address] PCP: Marcine Matar, MD  Chief Complaint  Patient presents with   Left Foot - Wound Check      HPI: Patient is a 42 year old gentleman who is seen for initial evaluation for diabetic left foot ulcer.  Patient has had radiographs.  Patient states he has been seen in the wound center with hyperbaric treatment daily.   Assessment & Plan: Visit Diagnoses:  1. Amputated great toe, right (HCC)   2. Chronic osteomyelitis of left foot (HCC)     Plan: Recommended proceeding with a left foot fifth ray amputation.  Patient states that he wants to talk to the wound clinic and he will call us to set up surgery.  Risk and benefits were discussed including risk of the wound not healing need for additional surgery.  Follow-Up Instructions: No follow-ups on file.   Ortho Exam  Patient is alert, oriented, no adenopathy, well-dressed, normal affect, normal respiratory effort. Examination patient has a strong dorsalis pedis pulse.  Patient has a superficial Wagner grade 1 ulcer beneath the first metatarsal head approximately 5 mm in diameter.  He has a deep Wagner grade 3 ulcer fifth metatarsal head laterally that probes to bone.  There is sausage digit swelling of the little toe.  Radiographs in July showed chronic osteomyelitis of the fifth metatarsal head with extensive bony destruction.  Most recent hemoglobin A1c is 11.9.  Imaging: No results found. No images are attached to the encounter.  Labs: Lab Results  Component Value Date   HGBA1C 11.9 (A) 12/31/2022   HGBA1C 11.7 (A) 04/28/2022   HGBA1C 11.2 (A) 12/23/2021   ESRSEDRATE 24 (H) 12/31/2022   ESRSEDRATE 48 (H) 03/21/2020   ESRSEDRATE 81 (H) 03/05/2020   CRP 12 (H) 12/31/2022   CRP 5.6  03/21/2020   CRP 9.0 (H) 03/05/2020   LABURIC 4.6 06/26/2021   LABURIC 5.1 02/18/2021   REPTSTATUS 03/09/2020 FINAL 03/04/2020   CULT  03/04/2020    NO GROWTH 5 DAYS Performed at [facility], [address], [place], [place] [address]    LABORGA GROUP B STREP(S.AGALACTIAE)ISOLATED 03/02/2020     Lab Results  Component Value Date   ALBUMIN 4.6 12/31/2022   ALBUMIN 4.8 04/28/2022   ALBUMIN 4.6 06/05/2021    No results found for: "MG" No results found for: "VD25OH"  No results found for: "PREALBUMIN"    Latest Ref Rng & Units 12/31/2022    3:19 PM 10/15/2021    7:26 AM 08/09/2020    6:26 AM  CBC EXTENDED  WBC 3.4 - 10.8 x10E3/uL 9.1  10.0  14.8   RBC 4.14 - 5.80 x10E6/uL 5.69  5.57  5.59   Hemoglobin 13.0 - 17.7 g/dL 44.0  34.7  42.5   HCT 37.5 - 51.0 % 48.2  46.9  46.7   Platelets 150 - 450 x10E3/uL 329  275  275      There is no height or weight on file to calculate BMI.  Orders:  No orders of the defined types were placed in this encounter.  No orders of the defined types were placed in  this encounter.    Procedures: No procedures performed  Clinical Data: No additional findings.  ROS:  All other systems negative, except as noted in the HPI. Review of Systems  Objective: Vital Signs: There were no vitals taken for this visit.  Specialty Comments:  No specialty comments available.  PMFS History: Patient Active Problem List   Diagnosis Date Noted   MDD (major depressive disorder), recurrent, in partial remission (HCC) 11/19/2021   Osteomyelitis of great toe of right foot (HCC)    Bipolar depression (HCC) 09/10/2021   Adjustment disorder with mixed anxiety and depressed mood 09/10/2021   Attention deficit hyperactivity disorder (ADHD), predominantly inattentive type 07/30/2020   Anxiety with depression 06/07/2020   Hyperlipidemia due to type 2 diabetes mellitus (HCC) 03/27/2020   Status post amputation of left great toe (HCC) 03/26/2020    Influenza vaccination declined 03/26/2020   23-polyvalent pneumococcal polysaccharide vaccine declined 03/26/2020   COVID-19 vaccine series completed 03/26/2020   Moderate major depression, single episode (HCC) 03/26/2020   Anaerobic bacteremia 03/07/2020   Major depressive disorder, recurrent episode, moderate (HCC) 03/04/2020   Abscess of great toe, left    Sepsis due to group B Streptococcus with acute renal failure (HCC)    Cellulitis of left toe 03/03/2020   Diabetic ketoacidosis (HCC) 03/03/2020   DKA (diabetic ketoacidosis) (HCC) 03/03/2020   Ulcer of right foot with fat layer exposed (HCC) 06/06/2019   Non-pressure chronic ulcer of other part of left foot limited to breakdown of skin (HCC) 06/06/2019   Uncontrolled type 2 diabetes mellitus with hyperglycemia (HCC) 05/02/2018   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) 07/30/2017   Venous insufficiency of both lower extremities 07/30/2017   Obesity, Class III, BMI 40-49.9 (morbid obesity) (HCC) 07/30/2017   Essential hypertension 09/03/2016   Past Medical History:  Diagnosis Date   ADHD    Anxiety    Bipolar disorder (HCC)    Depression    Diabetes mellitus without complication (HCC)    type 2   Heart murmur    as a child   HLD (hyperlipidemia)    Hypertension    no meds   Peripheral vascular disease (HCC)     Family History  Problem Relation Age of Onset   Diabetes Mother    Hyperlipidemia Father    Hypertension Father     Past Surgical History:  Procedure Laterality Date   AMPUTATION Left 03/06/2020   Procedure: LEFT GREAT TOE AMPUTATION;  Surgeon: [name], MD;  Location: [facility] OR;  Service: Orthopedics;  Laterality: Left;   AMPUTATION Right 10/15/2021   Procedure: RIGHT GREAT TOE AMPUTATION;  Surgeon: [name], MD;  Location: [facility] OR;  Service: Orthopedics;  Laterality: Right;   EYE SURGERY Bilateral    lasik   WISDOM TOOTH EXTRACTION     Social History   Occupational History   Occupation: Unemployed   Tobacco Use   Smoking status: Never   Smokeless tobacco: Never  Vaping Use   Vaping status: Never Used  Substance and Sexual Activity   Alcohol use: Not Currently    Comment: last drink 2018   Drug use: Never   Sexual activity: Not on file

## 2023-01-26 ENCOUNTER — Encounter: Payer: 59 | Attending: Physician Assistant | Admitting: Physician Assistant

## 2023-01-26 DIAGNOSIS — L97522 Non-pressure chronic ulcer of other part of left foot with fat layer exposed: Secondary | ICD-10-CM | POA: Diagnosis not present

## 2023-01-26 DIAGNOSIS — M86672 Other chronic osteomyelitis, left ankle and foot: Secondary | ICD-10-CM | POA: Diagnosis not present

## 2023-01-26 DIAGNOSIS — E08621 Diabetes mellitus due to underlying condition with foot ulcer: Secondary | ICD-10-CM | POA: Diagnosis not present

## 2023-01-26 DIAGNOSIS — I10 Essential (primary) hypertension: Secondary | ICD-10-CM | POA: Insufficient documentation

## 2023-01-26 LAB — GLUCOSE, CAPILLARY
Glucose-Capillary: 241 mg/dL — ABNORMAL HIGH (ref 70–99)
Glucose-Capillary: 205 mg/dL — ABNORMAL HIGH (ref 70–99)

## 2023-01-27 ENCOUNTER — Encounter (HOSPITAL_BASED_OUTPATIENT_CLINIC_OR_DEPARTMENT_OTHER): Payer: 59 | Admitting: Internal Medicine

## 2023-01-27 DIAGNOSIS — L97522 Non-pressure chronic ulcer of other part of left foot with fat layer exposed: Secondary | ICD-10-CM

## 2023-01-27 DIAGNOSIS — L97528 Non-pressure chronic ulcer of other part of left foot with other specified severity: Secondary | ICD-10-CM

## 2023-01-27 DIAGNOSIS — E11621 Type 2 diabetes mellitus with foot ulcer: Secondary | ICD-10-CM

## 2023-01-27 DIAGNOSIS — M86672 Other chronic osteomyelitis, left ankle and foot: Secondary | ICD-10-CM

## 2023-01-27 DIAGNOSIS — E08621 Diabetes mellitus due to underlying condition with foot ulcer: Secondary | ICD-10-CM | POA: Diagnosis not present

## 2023-01-27 DIAGNOSIS — L97512 Non-pressure chronic ulcer of other part of right foot with fat layer exposed: Secondary | ICD-10-CM

## 2023-01-27 DIAGNOSIS — I10 Essential (primary) hypertension: Secondary | ICD-10-CM | POA: Diagnosis not present

## 2023-01-27 LAB — GLUCOSE, CAPILLARY
Glucose-Capillary: 276 mg/dL — ABNORMAL HIGH (ref 70–99)
Glucose-Capillary: 220 mg/dL — ABNORMAL HIGH (ref 70–99)

## 2023-01-27 NOTE — Progress Notes (Signed)
IRAN, WALKENHORST ([id number]) [id number] Page 1 of 2 Visit Report for 01/26/2023 Arrival Information Details Patient Name: Date of Service: [name], [name] 01/26/2023 10:00 A M Medical Record Number: [id number] Patient Account Number: [id number] Date of Birth/Sex: Treating RN: [birth year] (42 y.o. M) Primary Care Loyda Costin: [name] Other Clinician: Referring Germaine Ripp: Treating Laurita Peron/Extender: Greta Doom in Treatment: 16 Visit Information History Since Last Visit Added or deleted any medications: No Patient Arrived: Ambulatory Any new allergies or adverse reactions: No Arrival Time: 09:50 Had a fall or experienced change in No Accompanied By: self activities of daily living that may affect Transfer Assistance: None risk of falls: Patient Identification Verified: Yes Signs or symptoms of abuse/neglect since last visito No Secondary Verification Process Completed: Yes Hospitalized since last visit: No Patient Requires Transmission-Based Precautions: No Implantable device outside of the clinic excluding No Patient Has Alerts: Yes cellular tissue based products placed in the center Patient Alerts: Left ABI 1.03 05/13/22 since last visit: Right ABI .78 05/13/22 Pain Present Now: No Electronic Signature(s) Signed: 01/27/2023 1:08:19 PM By: Demetria Pore Entered By: Demetria Pore on 01/26/2023 09:13:44 -------------------------------------------------------------------------------- Encounter Discharge Information Details Patient Name: Date of Service: [name] 01/26/2023 10:00 A M Medical Record Number: [id number] Patient Account Number: [id number] Date of Birth/Sex: Treating RN: 01/26/1981 (42 y.o. M) Primary Care Adyline Huberty: [name] Other Clinician: Referring Aviela Blundell: Treating Annalyssa Thune/Extender: Greta Doom in Treatment: 16 Encounter Discharge Information Items Discharge  Condition: Stable Ambulatory Status: Ambulatory Discharge Destination: Home Transportation: Private Auto Accompanied By: self Schedule Follow-up Appointment: Yes Clinical Summary of Care: [name], [name] ([id number]) [id number] Page 2 of 2 Electronic Signature(s) Signed: 01/27/2023 1:08:19 PM By: Demetria Pore Entered By: Demetria Pore on 01/26/2023 09:16:38 -------------------------------------------------------------------------------- Vitals Details Patient Name: Date of Service: [name] 01/26/2023 10:00 A M Medical Record Number: [id number] Patient Account Number: [id number] Date of Birth/Sex: Treating RN: Aug 04, 1980 (42 y.o. M) Primary Care Seirra Kos: [name] Other Clinician: Referring Scotland Korver: Treating Leyda Vanderwerf/Extender: Greta Doom in Treatment: 16 Vital Signs Time Taken: 09:50 Temperature (F): 97.9 Height (in): 73 Pulse (bpm): 111 Weight (lbs): 330 Respiratory Rate (breaths/min): 16 Body Mass Index (BMI): 43.5 Blood Pressure (mmHg): 140/80 Capillary Blood Glucose (mg/dl): 366 Reference Range: 80 - 120 mg / dl Airway Pulse Oximetry (%): 99 Electronic Signature(s) Signed: 01/27/2023 1:08:19 PM By: Demetria Pore Entered By: Demetria Pore on 01/26/2023 09:14:04

## 2023-01-27 NOTE — Progress Notes (Signed)
IMAAN, AYSON ([id number]) [id number] Page 1 of 2 Visit Report for 01/27/2023 HBO Details Patient Name: Date of Service: [name], [name] 01/27/2023 10:00 A M Medical Record Number: [id number] Patient Account Number: [id number] Date of Birth/Sex: Treating RN: 1980-12-10 (42 y.o. M) Primary Care Jensyn Shave: [name] Other Clinician: Referring Lexxi Koslow: Treating Hendrix Yurkovich/Extender: [name] in Treatment: 17 HBO Treatment Course Details Treatment Course Number: 1 Ordering Taevyn Hausen: Geralyn Corwin T Treatments Ordered: otal 40 HBO Treatment Start Date: 12/31/2022 HBO Indication: Chronic Refractory Osteomyelitis to osteomyelitis left foot HBO Treatment Details Treatment Number: 18 Patient Type: Outpatient Chamber Type: Monoplace Chamber Serial #: [id number] Treatment Protocol: 2.0 ATA with 90 minutes oxygen, with two 5 minute air breaks Treatment Details Compression Rate Down: 2.0 psi / minute De-Compression Rate Up: 1.5 psi / minute A breaks and breathing ir Compress Tx Pressure periods Decompress Decompress Begins Reached (leave unused spaces Begins Ends blank) Chamber Pressure (ATA 1 2 2 2 2 2  --2 1 ) Clock Time (24 hr) 9:58 10:09 10:39 10:44 11:15 11:20 - - 11:50 12:00 Treatment Length: 122 (minutes) Treatment Segments: 4 Vital Signs Capillary Blood Glucose Reference Range: 80 - 120 mg / dl HBO Diabetic Blood Glucose Intervention Range: <131 mg/dl or >387 mg/dl Time Vitals Blood Respiratory Capillary Blood Glucose Pulse Action Type: Pulse: Temperature: Taken: Pressure: Rate: Glucose (mg/dl): Meter #: Oximetry (%) Taken: Pre 09:50 138/80 99 16 98 276 1 100 non per protocol Post 12:00 140/80 99 16 97.8 330 1 100 Treatment Response Treatment Toleration: Well Treatment Completion Status: Treatment Completed without Adverse Event HBO Attestation I certify that I supervised this HBO treatment in accordance  with Medicare guidelines. A trained emergency response team is readily available per Yes hospital policies and procedures. Continue HBOT as ordered. Yes Electronic Signature(s) Signed: 01/27/2023 4:18:13 PM By: Geralyn Corwin DO Previous Signature: 01/27/2023 1:08:19 PM Version By: Demetria Pore Entered By: Geralyn Corwin on 01/27/2023 12:19:41 [name] ([id number]) [id number] Page 2 of 2 -------------------------------------------------------------------------------- HBO Safety Checklist Details Patient Name: Date of Service: [name], [name] 01/27/2023 10:00 A M Medical Record Number: [id number] Patient Account Number: [id number] Date of Birth/Sex: Treating RN: [birth year] (42 y.o. M) Primary Care Birgitta Uhlir: [name] Other Clinician: Referring Ona Rathert: Treating Xitlalli Newhard/Extender: [name] in Treatment: 17 HBO Safety Checklist Items Safety Checklist Consent Form Signed Patient voided / foley secured and emptied When did you last eato 01/27/2023 Last dose of injectable or oral agent 01/27/23 Ostomy pouch emptied and vented if applicable NA All implantable devices assessed, documented and approved NA Intravenous access site secured and place NA Valuables secured Linens and cotton and cotton/polyester blend (less than 51% polyester) Personal oil-based products / skin lotions / body lotions removed Wigs or hairpieces removed NA Smoking or tobacco materials removed NA Books / newspapers / magazines / loose paper removed Cologne, aftershave, perfume and deodorant removed Jewelry removed (may wrap wedding band) Make-up removed NA Hair care products removed Battery operated devices (external) removed NA Heating patches and chemical warmers removed NA Titanium eyewear removed NA Nail polish cured greater than 10 hours NA Casting material cured greater than 10 hours NA Hearing aids removed NA Loose  dentures or partials removed NA Prosthetics have been removed NA Patient demonstrates correct use of air break device (if applicable) Patient concerns have been addressed Patient grounding bracelet on and cord attached to chamber Specifics for Inpatients (complete in addition to above) Medication sheet sent with patient NA Intravenous medications needed or  due during therapy sent with patient NA Drainage tubes (e.g. nasogastric tube or chest tube secured and vented) NA Endotracheal or Tracheotomy tube secured NA Cuff deflated of air and inflated with saline NA Airway suctioned NA Electronic Signature(s) Signed: 01/27/2023 1:08:19 PM By: Demetria Pore Entered By: Demetria Pore on 01/27/2023 10:06:10

## 2023-01-27 NOTE — Progress Notes (Signed)
JASUN, OWCZARZAK ([id number]) [id number] Page 1 of 2 Visit Report for 01/27/2023 Arrival Information Details Patient Name: Date of Service: [name], [name] 01/27/2023 10:00 A M Medical Record Number: [id number] Patient Account Number: [id number] Date of Birth/Sex: Treating RN: 1980-06-26 (42 y.o. M) Primary Care Cacey Willow: [name] Other Clinician: Referring Sadiya Durand: Treating Sharia Averitt/Extender: [name] in Treatment: 17 Visit Information History Since Last Visit Added or deleted any medications: No Patient Arrived: Ambulatory Any new allergies or adverse reactions: No Arrival Time: 09:50 Had a fall or experienced change in No Accompanied By: self activities of daily living that may affect Transfer Assistance: None risk of falls: Patient Identification Verified: Yes Signs or symptoms of abuse/neglect since last visito No Secondary Verification Process Completed: Yes Hospitalized since last visit: No Patient Requires Transmission-Based Precautions: No Implantable device outside of the clinic excluding No Patient Has Alerts: Yes cellular tissue based products placed in the center Patient Alerts: Left ABI 1.03 05/13/22 since last visit: Right ABI .78 05/13/22 Pain Present Now: No Electronic Signature(s) Signed: 01/27/2023 1:08:19 PM By: Demetria Pore Entered By: Demetria Pore on 01/27/2023 10:06:02 -------------------------------------------------------------------------------- Encounter Discharge Information Details Patient Name: Date of Service: [name] 01/27/2023 10:00 A M Medical Record Number: [id number] Patient Account Number: [id number] Date of Birth/Sex: Treating RN: 05/13/1981 (42 y.o. M) Primary Care Jagjit Riner: [name] Other Clinician: Referring Mousa Prout: Treating Taten Merrow/Extender: [name] in Treatment: 17 Encounter Discharge Information  Items Discharge Condition: Stable Ambulatory Status: Ambulatory Discharge Destination: Home Transportation: Private Auto Accompanied By: self Schedule Follow-up Appointment: Yes Clinical Summary of Care: HAKOP, KIRT ([id number]) [id number] Page 2 of 2 Electronic Signature(s) Signed: 01/27/2023 1:08:19 PM By: Demetria Pore Entered By: Demetria Pore on 01/27/2023 10:07:55 -------------------------------------------------------------------------------- Vitals Details Patient Name: Date of Service: [name] 01/27/2023 10:00 A M Medical Record Number: [id number] Patient Account Number: [id number] Date of Birth/Sex: Treating RN: 18-Sep-1980 (42 y.o. M) Primary Care Armstrong Creasy: [name] Other Clinician: Referring Vartan Kerins: Treating Brayden Brodhead/Extender: [name] in Treatment: 17 Vital Signs Time Taken: 09:50 Temperature (F): 98.0 Height (in): 73 Pulse (bpm): 99 Weight (lbs): 330 Respiratory Rate (breaths/min): 16 Body Mass Index (BMI): 43.5 Blood Pressure (mmHg): 138/80 Capillary Blood Glucose (mg/dl): 244 Reference Range: 80 - 120 mg / dl Airway Pulse Oximetry (%): 100 Electronic Signature(s) Signed: 01/27/2023 1:08:19 PM By: Demetria Pore Entered By: Demetria Pore on 01/27/2023 10:06:05

## 2023-01-28 ENCOUNTER — Encounter: Payer: 59 | Admitting: Physician Assistant

## 2023-01-28 DIAGNOSIS — E08621 Diabetes mellitus due to underlying condition with foot ulcer: Secondary | ICD-10-CM | POA: Diagnosis not present

## 2023-01-28 DIAGNOSIS — L97522 Non-pressure chronic ulcer of other part of left foot with fat layer exposed: Secondary | ICD-10-CM | POA: Diagnosis not present

## 2023-01-28 DIAGNOSIS — M86672 Other chronic osteomyelitis, left ankle and foot: Secondary | ICD-10-CM | POA: Diagnosis not present

## 2023-01-28 DIAGNOSIS — I10 Essential (primary) hypertension: Secondary | ICD-10-CM | POA: Diagnosis not present

## 2023-01-28 LAB — GLUCOSE, CAPILLARY
Glucose-Capillary: 237 mg/dL — ABNORMAL HIGH (ref 70–99)
Glucose-Capillary: 184 mg/dL — ABNORMAL HIGH (ref 70–99)

## 2023-01-28 NOTE — Progress Notes (Signed)
ELLISON, SACHDEVA ([id number]) [id number] Page 1 of 2 Visit Report for 01/28/2023 Arrival Information Details Patient Name: Date of Service: [name], [name] 01/28/2023 10:00 A M Medical Record Number: [id number] Patient Account Number: [id number] Date of Birth/Sex: Treating RN: Oct 14, 1980 (42 y.o. M) Primary Care Joziah Dollins: [name] Other Clinician: Referring Latajah Thuman: Treating Derold Dorsch/Extender: Greta Doom in Treatment: 17 Visit Information History Since Last Visit Added or deleted any medications: No Patient Arrived: Ambulatory Any new allergies or adverse reactions: No Arrival Time: 09:45 Had a fall or experienced change in No Accompanied By: self activities of daily living that may affect Transfer Assistance: None risk of falls: Patient Identification Verified: Yes Signs or symptoms of abuse/neglect since last visito No Secondary Verification Process Completed: Yes Hospitalized since last visit: No Patient Requires Transmission-Based Precautions: No Implantable device outside of the clinic excluding No Patient Has Alerts: Yes cellular tissue based products placed in the center Patient Alerts: Left ABI 1.03 05/13/22 since last visit: Right ABI .78 05/13/22 Pain Present Now: No Electronic Signature(s) Signed: 01/28/2023 1:03:29 PM By: Demetria Pore Entered By: Demetria Pore on 01/28/2023 10:02:26 -------------------------------------------------------------------------------- Encounter Discharge Information Details Patient Name: Date of Service: [name] 01/28/2023 10:00 A M Medical Record Number: [id number] Patient Account Number: [id number] Date of Birth/Sex: Treating RN: [birth year] (42 y.o. M) Primary Care Nathaniel Wakeley: [name] Other Clinician: Referring Diarra Kos: Treating Lynee Rosenbach/Extender: Greta Doom in Treatment: 17 Encounter Discharge Information Items Discharge  Condition: Stable Ambulatory Status: Ambulatory Discharge Destination: Home Transportation: Private Auto Accompanied By: self Schedule Follow-up Appointment: Yes Clinical Summary of Care: SHAUNDELL, LEDFORD ([id number]) [id number] Page 2 of 2 Electronic Signature(s) Signed: 01/28/2023 1:03:29 PM By: Demetria Pore Entered By: Demetria Pore on 01/28/2023 10:03:10 -------------------------------------------------------------------------------- Vitals Details Patient Name: Date of Service: [name] 01/28/2023 10:00 A M Medical Record Number: [id number] Patient Account Number: [id number] Date of Birth/Sex: Treating RN: [birth year] (42 y.o. M) Primary Care [name]: [name] Other Clinician: Referring [name]: Treating Karlynn Furrow/Extender: Greta Doom in Treatment: 17 Vital Signs Time Taken: 09:45 Temperature (F): 98.0 Height (in): 73 Pulse (bpm): 94 Weight (lbs): 330 Respiratory Rate (breaths/min): 16 Body Mass Index (BMI): 43.5 Blood Pressure (mmHg): 126/80 Capillary Blood Glucose (mg/dl): 254 Reference Range: 80 - 120 mg / dl Airway Pulse Oximetry (%): 99 Electronic Signature(s) Signed: 01/28/2023 1:03:29 PM By: Demetria Pore Entered By: Demetria Pore on 01/28/2023 10:02:29

## 2023-01-29 ENCOUNTER — Encounter: Payer: 59 | Admitting: Physician Assistant

## 2023-01-29 DIAGNOSIS — I10 Essential (primary) hypertension: Secondary | ICD-10-CM | POA: Diagnosis not present

## 2023-01-29 DIAGNOSIS — E08621 Diabetes mellitus due to underlying condition with foot ulcer: Secondary | ICD-10-CM | POA: Diagnosis not present

## 2023-01-29 DIAGNOSIS — M86672 Other chronic osteomyelitis, left ankle and foot: Secondary | ICD-10-CM | POA: Diagnosis not present

## 2023-01-29 DIAGNOSIS — L97522 Non-pressure chronic ulcer of other part of left foot with fat layer exposed: Secondary | ICD-10-CM | POA: Diagnosis not present

## 2023-01-29 LAB — GLUCOSE, CAPILLARY
Glucose-Capillary: 211 mg/dL — ABNORMAL HIGH (ref 70–99)
Glucose-Capillary: 159 mg/dL — ABNORMAL HIGH (ref 70–99)

## 2023-01-29 NOTE — Progress Notes (Signed)
ANDRON, BEATTIE ([id number]) [id number] Page 1 of 2 Visit Report for 01/29/2023 HBO Details Patient Name: Date of Service: [name], [name] 01/29/2023 10:00 A M Medical Record Number: [id number] Patient Account Number: [id number] Date of Birth/Sex: Treating RN: [birth year] (42 y.o. 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 HBO Treatment Details Treatment Number: 20 Patient Type: Outpatient Chamber Type: Monoplace Chamber Serial #: [id number] Treatment Protocol: 2.0 ATA with 90 minutes oxygen, with two 5 minute air breaks Treatment Details Compression Rate Down: 2.0 psi / minute De-Compression Rate Up: 1.5 psi / minute A breaks and breathing ir Compress Tx Pressure periods Decompress Decompress Begins Reached (leave unused spaces Begins Ends blank) Chamber Pressure (ATA 1 2 2 2 2 2  --2 1 ) Clock Time (24 hr) 9:59 10:10 10:40 10:45 11:16 11:21 - - 11:51 12:01 Treatment Length: 122 (minutes) Treatment Segments: 4 Vital Signs Capillary Blood Glucose Reference Range: 80 - 120 mg / dl HBO Diabetic Blood Glucose Intervention Range: <131 mg/dl or >782 mg/dl Time Vitals Blood Respiratory Capillary Blood Glucose Pulse Action Type: Pulse: Temperature: Taken: Pressure: Rate: Glucose (mg/dl): Meter #: Oximetry (%) Taken: Pre 09:45 138/80 98 16 98 211 1 99 non per protocol Post 12:01 134/80 98 16 97.9 159 1 99 non per protocol Treatment Response Treatment Toleration: Well Treatment Completion Status: Treatment Completed without Adverse Event Electronic Signature(s) Signed: 01/29/2023 12:17:33 PM By: Demetria Pore Signed: 01/29/2023 2:02:35 PM By: Allen Derry PA-C Entered By: Demetria Pore on 01/29/2023 09:16:48 [name] ([id number]) [id number] Page 2 of 2 -------------------------------------------------------------------------------- HBO Safety Checklist Details Patient Name: Date of Service: [name], [name] 01/29/2023 10:00 A M Medical Record Number: [id number] Patient Account Number: [id number] Date of Birth/Sex: Treating RN: [birth year] (42 y.o. M) Primary Care Kayle Passarelli: [name] Other Clinician: Referring Rydell Wiegel: Treating Thara Searing/Extender: Greta Doom in Treatment: 17 HBO Safety Checklist Items Safety Checklist Consent Form Signed Patient voided / foley secured and emptied When did you last eato 01/29/23 Last dose of injectable or oral agent 01/29/23 Ostomy pouch emptied and vented if applicable NA All implantable devices assessed, documented and approved NA Intravenous access site secured and place NA Valuables secured Linens and cotton and cotton/polyester blend (less than 51% polyester) Personal oil-based products / skin lotions / body lotions removed Wigs or hairpieces removed NA Smoking or tobacco materials removed NA Books / newspapers / magazines / loose paper removed Cologne, aftershave, perfume and deodorant removed Jewelry removed (may wrap wedding band) Make-up removed NA Hair care products removed Battery operated devices (external) removed NA Heating patches and chemical warmers removed NA Titanium eyewear removed NA Nail polish cured greater than 10 hours NA Casting material cured greater than 10 hours NA Hearing aids removed NA Loose dentures or partials removed NA Prosthetics have been removed NA Patient demonstrates correct use of air break device (if applicable) Patient concerns have been addressed Patient grounding bracelet on and cord attached to chamber Specifics for  Inpatients (complete in addition to above) Medication sheet sent with patient NA Intravenous medications needed or due during therapy sent with patient NA Drainage tubes (e.g. nasogastric tube or chest tube secured and vented) NA Endotracheal or Tracheotomy tube secured NA Cuff deflated of air and inflated with saline  NA Airway suctioned NA Electronic Signature(s) Signed: 01/29/2023 12:17:33 PM By: Demetria Pore Entered By: Demetria Pore on 01/29/2023 09:15:25

## 2023-01-29 NOTE — Progress Notes (Signed)
[name], [name] ([id number]) [id number] Page 1 of 2 Visit Report for 01/26/2023 HBO Details Patient Name: Date of Service: [name], [name] 01/26/2023 10:00 A M Medical Record Number: [id number] Patient Account Number: [id number] Date of Birth/Sex: Treating RN: June 02, 1980 (42 y.o. 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 HBO Treatment Details Treatment Number: 17 Patient Type: Outpatient Chamber Type: Monoplace Chamber Serial #: [id number] Treatment Protocol: 2.0 ATA with 90 minutes oxygen, with two 5 minute air breaks Treatment Details Compression Rate Down: 2.0 psi / minute De-Compression Rate Up: 1.5 psi / minute A breaks and breathing ir Compress Tx Pressure periods Decompress Decompress Begins Reached (leave unused spaces Begins Ends blank) Chamber Pressure (ATA 1 2 2 2 2 2  --2 1 ) Clock Time (24 hr) 9:58 10:08 10:39 10:44 11:14 11:20 - - 11:50 11:59 Treatment Length: 121 (minutes) Treatment Segments: 4 Vital Signs Capillary Blood Glucose Reference Range: 80 - 120 mg / dl HBO Diabetic Blood Glucose Intervention Range: <131 mg/dl or >782 mg/dl Time Vitals Blood Respiratory Capillary Blood Glucose Pulse Action Type: Pulse: Temperature: Taken: Pressure: Rate: Glucose (mg/dl): Meter #: Oximetry (%) Taken: Pre 09:50 140/80 111 16 97.9 241 1 99 non per protocol Post 11:59 120/80 84 16 97.8 205 1 99 non per protocol Treatment Response Treatment Toleration: Well Treatment Completion Status: Treatment Completed without Adverse Event Electronic Signature(s) Signed: 01/27/2023 1:08:19 PM By: Demetria Pore Signed: 01/29/2023 2:02:35 PM By: Allen Derry PA-C Entered By: Demetria Pore on 01/26/2023 09:15:36 [name] ([id number]) [id number] Page 2 of 2 -------------------------------------------------------------------------------- HBO Safety Checklist Details Patient Name: Date of Service: [name], [name] 01/26/2023 10:00 A M Medical Record Number: [id number] Patient Account Number: [id number] Date of Birth/Sex: Treating RN: May 22, 1981 (42 y.o. M) Primary Care Kimanh Templeman: [name] Other Clinician: Referring Aella Ronda: Treating Thoma Paulsen/Extender: Greta Doom in Treatment: 16 HBO Safety Checklist Items Safety Checklist Consent Form Signed Patient voided / foley secured and emptied When did you last eato 01/26/23 Last dose of injectable or oral agent 01/26/23 Ostomy pouch emptied and vented if applicable NA All implantable devices assessed, documented and approved NA Intravenous access site secured and place NA Valuables secured Linens and cotton and cotton/polyester blend (less than 51% polyester) Personal oil-based products / skin lotions / body lotions removed Wigs or hairpieces removed NA Smoking or tobacco materials removed NA Books / newspapers / magazines / loose paper removed Cologne, aftershave, perfume and deodorant removed Jewelry removed (may wrap wedding band) Make-up removed NA Hair care products removed Battery operated devices (external) removed NA Heating patches and chemical warmers removed NA Titanium eyewear removed NA Nail polish cured greater than 10 hours NA Casting material cured greater than 10 hours NA Hearing aids removed NA Loose dentures or partials removed NA Prosthetics have been removed NA Patient demonstrates correct use of air break device (if applicable) Patient concerns have been addressed Patient grounding bracelet on and cord attached to chamber Specifics for  Inpatients (complete in addition to above) Medication sheet sent with patient NA Intravenous medications needed or due during therapy sent with patient NA Drainage tubes (e.g. nasogastric tube or chest tube secured and vented) NA Endotracheal or Tracheotomy tube secured NA Cuff deflated of air and inflated with saline  NA Airway suctioned NA Electronic Signature(s) Signed: 01/27/2023 1:08:19 PM By: Demetria Pore Entered By: Demetria Pore on 01/26/2023 09:14:22

## 2023-01-29 NOTE — Progress Notes (Signed)
GEHRIG, SCHIESS ([id number]) [id number] Page 1 of 12 Visit Report for 01/27/2023 Arrival Information Details Patient Name: Date of Service: [name], [name] 01/27/2023 12:30 PM Medical Record Number: [id number] Patient Account Number: [id number] Date of Birth/Sex: Treating RN: Jun 20, 1980 (42 y.o. [name] Primary Care [place] Neuroth: [name] Other Clinician: Referring Adysen Raphael: Treating Iyanni Hepp/Extender: [name] in Treatment: 17 Visit Information History Since Last Visit Added or deleted any medications: No Patient Arrived: Ambulatory Any new allergies or adverse reactions: No Arrival Time: 12:36 Has Dressing in Place as Prescribed: Yes Accompanied By: self Pain Present Now: No Transfer Assistance: None Patient Identification Verified: Yes Secondary Verification Process Completed: Yes Patient Requires Transmission-Based Precautions: No Patient Has Alerts: Yes Patient Alerts: Left ABI 1.03 05/13/22 Right ABI .78 05/13/22 Electronic Signature(s) Signed: 01/27/2023 3:11:54 PM By: [name] Entered By: Midge Aver on 01/27/2023 12:11:54 -------------------------------------------------------------------------------- Clinic Level of Care Assessment Details Patient Name: Date of Service: [name], [name] 01/27/2023 12:30 PM Medical Record Number: [id number] Patient Account Number: [id number] Date of Birth/Sex: Treating RN: 01/10/1981 (42 y.o. [name] Primary Care [place] Rothgeb: [name] Other Clinician: Referring Shondrika Hoque: Treating Chaynce Schafer/Extender: [name] in Treatment: 17 Clinic Level of Care Assessment Items TOOL 4 Quantity Score X- 1 0 Use when only an EandM is performed on FOLLOW-UP visit ASSESSMENTS - Nursing Assessment / Reassessment X- 1 10 Reassessment of Co-morbidities (includes updates in patient status) X- 1 5 Reassessment of  Adherence to Treatment Plan ASSESSMENTS - Wound and Skin A ssessment / Reassessment []  - 0 Simple Wound Assessment / Reassessment - one wound [name] [id number] Page 2 of 12 X- 3 5 Complex Wound Assessment / Reassessment - multiple wounds []  - 0 Dermatologic / Skin Assessment (not related to wound area) ASSESSMENTS - Focused Assessment []  - 0 Circumferential Edema Measurements - multi extremities []  - 0 Nutritional Assessment / Counseling / Intervention []  - 0 Lower Extremity Assessment (monofilament, tuning fork, pulses) []  - 0 Peripheral Arterial Disease Assessment (using hand held doppler) ASSESSMENTS - Ostomy and/or Continence Assessment and Care []  - 0 Incontinence Assessment and Management []  - 0 Ostomy Care Assessment and Management (repouching, etc.) PROCESS - Coordination of Care []  - 0 Simple Patient / Family Education for ongoing care X- 1 20 Complex (extensive) Patient / Family Education for ongoing care X- 1 10 Staff obtains Chiropractor, Records, T Results / Process Orders est []  - 0 Staff telephones HHA, Nursing Homes / Clarify orders / etc []  - 0 Routine Transfer to another Facility (non-emergent condition) []  - 0 Routine Hospital Admission (non-emergent condition) []  - 0 New Admissions / Manufacturing engineer / Ordering NPWT Apligraf, etc. , []  - 0 Emergency Hospital Admission (emergent condition) []  - 0 Simple Discharge Coordination X- 1 15 Complex (extensive) Discharge Coordination PROCESS - Special Needs []  - 0 Pediatric / Minor Patient Management []  - 0 Isolation Patient Management []  - 0 Hearing / Language / Visual special needs []  - 0 Assessment of Community assistance (transportation, D/[name] planning, etc.) []  - 0 Additional assistance / Altered mentation []  - 0 Support Surface(s) Assessment (bed, cushion, seat, etc.) INTERVENTIONS - Wound Cleansing / Measurement []  - 0 Simple Wound  Cleansing - one wound X- 3 5 Complex Wound Cleansing - multiple wounds []  - 0 Wound Imaging (photographs - any number of wounds) []  - 0 Wound Tracing (instead of photographs) []  - 0 Simple Wound Measurement - one wound []  -  0 Complex Wound Measurement - multiple wounds INTERVENTIONS - Wound Dressings X - Small Wound Dressing one or multiple wounds 3 10 []  - 0 Medium Wound Dressing one or multiple wounds []  - 0 Large Wound Dressing one or multiple wounds []  - 0 Application of Medications - topical []  - 0 Application of Medications - injection INTERVENTIONS - Miscellaneous []  - 0 External ear exam []  - 0 Specimen Collection (cultures, biopsies, blood, body fluids, etc.) []  - 0 Specimen(s) / Culture(s) sent or taken to Lab for analysis [name], [name] ([id number]) [id number] Page 3 of 12 []  - 0 Patient Transfer (multiple staff / Nurse, adult / Similar devices) []  - 0 Simple Staple / Suture removal (25 or less) []  - 0 Complex Staple / Suture removal (26 or more) []  - 0 Hypo / Hyperglycemic Management (close monitor of Blood Glucose) []  - 0 Ankle / Brachial Index (ABI) - do not check if billed separately X- 1 5 Vital Signs Has the patient been seen at the hospital within the last three years: Yes Total Score: 125 Level Of Care: New/Established - Level 4 Electronic Signature(s) Signed: 01/28/2023 5:01:27 PM By: [name] Entered By: Midge Aver on 01/27/2023 12:13:09 -------------------------------------------------------------------------------- Encounter Discharge Information Details Patient Name: Date of Service: [name] 01/27/2023 12:30 PM Medical Record Number: [id number] Patient Account Number: [id number] Date of Birth/Sex: Treating RN: [birth year] (42 y.o. [name] Primary Care Carmon Brigandi: [name] Other Clinician: Referring Rance Smithson: Treating Zacchary Pompei/Extender: [name] in Treatment: 17 Encounter Discharge Information Items Discharge Condition: Stable Ambulatory Status: Ambulatory Discharge Destination: Home Transportation: Private Auto Accompanied By: self Schedule Follow-up Appointment: Yes Clinical Summary of Care: Electronic Signature(s) Signed: 01/27/2023 3:15:54 PM By: [name] Entered By: Midge Aver on 01/27/2023 12:15:54 -------------------------------------------------------------------------------- Lower Extremity Assessment Details Patient Name: Date of Service: [name], [name] 01/27/2023 12:30 PM Medical Record Number: [id number] Patient Account Number: [id number] Date of Birth/Sex: Treating RN: 18-Oct-1980 (42 y.o. [name] Primary Care Nehemyah Foushee: [name] Other Clinician: Darliss [name] ([id number]) [id number] Page 4 of 12 Referring Nial Hawe: Treating Graceanne Guin/Extender: [name] in Treatment: 17 Edema Assessment Assessed: [Left: No] [Right: No] Edema: [Left: N] [Right: o] Calf Left: Right: Point of Measurement: 40 cm From Medial Instep 44 cm Ankle Left: Right: Point of Measurement: 12 cm From Medial Instep 26 cm Vascular Assessment Pulses: Dorsalis Pedis Palpable: [Left:Yes] Extremity colors, hair growth, and conditions: Extremity Color: [Left:Normal] Hair Growth on Extremity: [Left:Yes] Temperature of Extremity: [Left:Warm] Capillary Refill: [Left:< 3 seconds] Dependent Rubor: [Left:No] Blanched when Elevated: [Left:No No] Toe Nail Assessment Left: Right: Thick: No Discolored: No Deformed: No Improper Length and Hygiene: No Electronic Signature(s) Signed: 01/27/2023 3:12:25 PM By: [name] Entered By: Midge Aver on 01/27/2023 12:12:24 -------------------------------------------------------------------------------- Multi Wound Chart Details Patient Name: Date of Service: [name] 01/27/2023 12:30 PM Medical Record Number: [id number] Patient Account Number: [id number] Date of Birth/Sex: Treating RN: 13-Mar-1981 (42 y.o. 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 Left, Lateral Metatarsal head fifth Wound Location: Shear/Friction Pressure Injury Blister Wounding Event: Diabetic Wound/Ulcer of the  Lower Diabetic Wound/Ulcer of the Lower Diabetic Wound/Ulcer of the Lower Primary Etiology: Extremity Extremity Extremity Hypertension, Peripheral Venous Hypertension, Peripheral Venous Hypertension, Peripheral Venous Comorbid History: Disease, Type II Diabetes, Disease, Type II Diabetes, Disease, Type II Diabetes, Osteomyelitis Osteomyelitis Osteomyelitis 11/18/2022 12/27/2022 10/07/2022 Date Acquired: 10 0 14 Weeks of Treatment: Open Open Open Wound Status: No No No Wound Recurrence: 0.3x0.4x0.3 0.5x0.2x0.4 0.5x0.1x0.4 Measurements L x W x D (cm) 0.094 0.079 0.039 A (cm) : rea 0.028 0.031 0.016 Volume (cm) : 70.10% N/A 99.10% % Reduction in A rea: 70.20% N/A 98.10% % Reduction in Volume: Grade 1 Grade 2 Grade 3 Classification: Medium Medium Medium Exudate A mount: Serosanguineous Serosanguineous Serosanguineous Exudate Type: red, brown red, brown red, brown Exudate Color: N/A N/A Flat and Intact Wound Margin: Large (67-100%) None Present (0%) Medium (34-66%) Granulation A mount: Red, Pink N/A Red, Pink Granulation Quality: Small (1-33%) None Present (0%) Medium (34-66%) Necrotic A  mount: Fat Layer (Subcutaneous Tissue): Yes Fascia: No Fat Layer (Subcutaneous Tissue): Yes Exposed Structures: Fascia: No Fat Layer (Subcutaneous Tissue): No Tendon: Yes Tendon: No Tendon: No Fascia: No Muscle: No Muscle: No Muscle: No Joint: No Joint: No Joint: No Bone: No Bone: No Bone: No None None None Epithelialization: Treatment Notes Electronic Signature(s) Signed: 01/27/2023 3:12:34 PM By: [name] Entered By: Midge Aver on 01/27/2023 12:12:34 -------------------------------------------------------------------------------- Multi-Disciplinary Care Plan Details Patient Name: Date of Service: [name] 01/27/2023 12:30 PM Medical Record Number: [id number] Patient Account Number: [id number] Date of Birth/Sex: Treating RN: 1980-08-12 (42 y.o. [name] Primary Care Lasharon Dunivan: [name] Other Clinician: Referring Lonzo Saulter: Treating Anea Fodera/Extender: [name] in Treatment: 17 Active Inactive Wound/Skin Impairment Nursing Diagnoses: Knowledge deficit related to ulceration/compromised skin integrity KELSTON, MASAR ([id number]) [id number] Page 6 of 12 Goals: Patient/caregiver will verbalize understanding of skin care regimen Date Initiated: 09/30/2022 Date Inactivated: 12/02/2022 Target Resolution Date: 10/31/2022 Goal Status: Met Ulcer/skin breakdown will have a volume reduction of 30% by week 4 Date Initiated: 09/30/2022 Date Inactivated: 12/02/2022 Target Resolution Date: 10/31/2022 Goal Status: Unmet Unmet Reason: continue care Ulcer/skin breakdown will have a volume reduction of 50% by week 8 Date Initiated: 09/30/2022 Date Inactivated: 12/09/2022 Target Resolution Date: 11/30/2022 Goal Status: Met Ulcer/skin breakdown will have a volume reduction of 80% by week 12 Date Initiated: 09/30/2022 Date Inactivated: 01/13/2023 Target Resolution Date: 12/31/2022 Goal Status:  Unmet Unmet Reason: comorbidities Ulcer/skin breakdown will heal within 14 weeks Date Initiated: 09/30/2022 Target Resolution Date: 01/31/2023 Goal Status: Active Interventions: Assess patient/caregiver ability to obtain necessary supplies Assess patient/caregiver ability to perform ulcer/skin care regimen upon admission and as needed Assess ulceration(s) every visit Notes: Electronic Signature(s) Signed: 01/27/2023 3:13:32 PM By: [name] Entered By: Midge Aver on 01/27/2023 12:13:32 -------------------------------------------------------------------------------- Pain Assessment Details Patient Name: Date of Service: [name] 01/27/2023 12:30 PM Medical Record Number: [id number] Patient Account Number: [id number] Date of Birth/Sex: Treating RN: 1981/01/10 (42 y.o. [name] Primary Care Nyasiah Moffet: [name] Other Clinician: Referring Junelle Hashemi: Treating Kenslei Hearty/Extender: [name] in Treatment: 17 Active Problems Location of Pain Severity and Description of Pain Patient Has Paino No Site Locations [place], [place] [name] ([id number]) [id number] Page 7 of 12 Pain Management and Medication Current Pain Management: Electronic Signature(s) Signed: 01/27/2023 3:12:06 PM By: [name] Entered By: Midge Aver on 01/27/2023 12:12:05 -------------------------------------------------------------------------------- Patient/Caregiver Education Details Patient Name: Date of Service: [name] 9/4/2024andnbsp12:30 PM Medical Record  Number: [id number] Patient Account Number: [id number] Date of Birth/Gender: Treating RN: March 15, 1981 (42 y.o. [name] Primary Care Physician: [name] Other Clinician: Referring Physician: Treating Physician/Extender: [name] in Treatment: 17 Education Assessment Education Provided  To: Patient Education Topics Provided Wound/Skin Impairment: Handouts: Caring for Your Ulcer Methods: Explain/Verbal Responses: State content correctly Electronic Signature(s) Signed: 01/28/2023 5:01:27 PM By: [name] Entered By: Midge Aver on 01/27/2023 12:13:52 -------------------------------------------------------------------------------- Wound Assessment Details Patient Name: Date of Service: [name] 01/27/2023 12:30 PM Medical Record Number: [id number] Patient Account Number: [id number] Date of Birth/Sex: Treating RN: 1981/05/25 (42 y.o. [name] Primary Care Nikkole Placzek: [name] Other Clinician: Referring Jory Tanguma: Treating Jhoanna Heyde/Extender: [name] in Treatment: 17 Wound Status Wound Number: 10 Primary Diabetic Wound/Ulcer of the Lower Extremity Etiology: SHAVEZ, MCGARTY ([id number]) [id number] Page 8 of 12 Etiology: Wound Location: Left Amputation Site - Toe Wound Status: Open Wounding Event: Shear/Friction Comorbid Hypertension, Peripheral Venous Disease, Type II Diabetes, Date Acquired: 11/18/2022 History: Osteomyelitis Weeks Of Treatment: 10 Clustered Wound: No Photos Wound Measurements Length: (cm) 0.3 Width: (cm) 0.4 Depth: (cm) 0.3 Area: (cm) 0.094 Volume: (cm) 0.028 % Reduction in Area: 70.1% % Reduction in Volume: 70.2% Epithelialization: None Wound Description Classification: Grade 1 Exudate Amount: Medium Exudate Type: Serosanguineous Exudate Color: red, brown Foul Odor After Cleansing: No Slough/Fibrino Yes Wound Bed Granulation Amount: Large (67-100%) Exposed Structure Granulation Quality: Red, Pink Fascia Exposed: No Necrotic Amount: Small (1-33%) Fat Layer (Subcutaneous Tissue) Exposed: Yes Necrotic Quality: Adherent Slough Tendon Exposed: No Muscle Exposed: No Joint Exposed: No Bone Exposed: No Treatment Notes Wound #10  (Amputation Site - Toe) Wound Laterality: Left Cleanser Soap and Water Discharge Instruction: Gently cleanse wound with antibacterial soap, rinse and pat dry prior to dressing wounds Peri-Wound Care Topical Primary Dressing Hydrofera Blue Ready Transfer Foam, 4x5 (in/in) Discharge Instruction: Apply Hydrofera Blue Ready to wound bed as directed Secondary Dressing Kerlix 4.5 x 4.1 (in/yd) Discharge Instruction: Apply Kerlix 4.5 x 4.1 (in/yd) as instructed Secured With Medipore T - 56M Medipore H Soft Cloth Surgical T ape ape, 2x2 (in/yd) Compression Wrap Compression Stockings Add-Ons Electronic Signature(s) Signed: 01/28/2023 5:01:27 PM By: [name] Entered By: Midge Aver on 01/27/2023 09:50:53 [name] ([id number]) [id number] Page 9 of 12 -------------------------------------------------------------------------------- Wound Assessment Details Patient Name: Date of Service: [name], [name] 01/27/2023 12:30 PM Medical Record Number: [id number] Patient Account Number: [id number] Date of Birth/Sex: Treating RN: [birth year] (42 y.o. [name] Primary Care Sabrina Arriaga: [name] Other Clinician: Referring Sofie Schendel: Treating Nicklos Gaxiola/Extender: [name] in Treatment: 17 Wound Status Wound Number: 11 Primary Diabetic Wound/Ulcer of the Lower Extremity Etiology: Wound Location: Right Amputation Site - Toe Wound Status: Open Wounding Event: Pressure Injury Comorbid Hypertension, Peripheral Venous Disease, Type II Diabetes, Date Acquired: 12/27/2022 History: Osteomyelitis Weeks Of Treatment: 0 Clustered Wound: No Photos Wound Measurements Length: (cm) 0.5 Width: (cm) 0.2 Depth: (cm) 0.4 Area: (cm) 0.079 Volume: (cm) 0.031 % Reduction in Area: % Reduction in Volume: Epithelialization: None Wound Description Classification: Grade 2 Exudate Amount: Medium Exudate Type:  Serosanguineous Exudate Color: red, brown Wound Bed Granulation Amount: None Present (0%) Exposed Structure Necrotic Amount: None Present (0%) Fascia Exposed: No Fat Layer (Subcutaneous Tissue) Exposed: No Tendon Exposed: No Muscle Exposed: No Joint Exposed: No Bone Exposed: No Treatment Notes Wound #11 (Amputation Site - Toe) Wound Laterality: Right Cleanser Soap and Water Discharge Instruction: Gently  cleanse wound with antibacterial soap, rinse and pat dry prior to dressing wounds [name], [name] ([id number]) [id number].pdf Page 10 of 12 Peri-Wound Care Topical Primary Dressing Hydrofera Blue Ready Transfer Foam, 4x5 (in/in) Discharge Instruction: Apply Hydrofera Blue Ready to wound bed as directed Secondary Dressing Kerlix 4.5 x 4.1 (in/yd) Discharge Instruction: Apply Kerlix 4.5 x 4.1 (in/yd) as instructed Secured With Medipore T - 69M Medipore H Soft Cloth Surgical T ape ape, 2x2 (in/yd) Compression Wrap Compression Stockings Add-Ons Electronic Signature(s) Signed: 01/28/2023 5:01:27 PM By: [name] Entered By: Midge Aver on 01/27/2023 10:24:40 -------------------------------------------------------------------------------- Wound Assessment Details Patient Name: Date of Service: [name] 01/27/2023 12:30 PM Medical Record Number: [id number] Patient Account Number: [id number] Date of Birth/Sex: Treating RN: 06/28/1980 (42 y.o. [name] Primary Care Alontae Chaloux: [name] Other Clinician: Referring Jamier Urbas: Treating Stephonie Wilcoxen/Extender: [name] in Treatment: 17 Wound Status Wound Number: 9 Primary Diabetic Wound/Ulcer of the Lower Extremity Etiology: Wound Location: Left, Lateral Metatarsal head fifth Wound Status: Open Wounding Event: Blister Comorbid Hypertension, Peripheral Venous Disease, Type II Diabetes, Date Acquired: 10/07/2022 History: Osteomyelitis Weeks  Of Treatment: 14 Clustered Wound: No Photos Wound Measurements Length: (cm) 0.5 Width: (cm) 0.1 Depth: (cm) 0.4 Area: (cm) 0.039 Volume: (cm) 0.016 [name], [name] ([id number]) % Reduction in Area: 99.1% % Reduction in Volume: 98.1% Epithelialization: None [id number] Page 11 of 12 Wound Description Classification: Grade 3 Wound Margin: Flat and Intact Exudate Amount: Medium Exudate Type: Serosanguineous Exudate Color: red, brown Foul Odor After Cleansing: No Slough/Fibrino Yes Wound Bed Granulation Amount: Medium (34-66%) Exposed Structure Granulation Quality: Red, Pink Fascia Exposed: No Necrotic Amount: Medium (34-66%) Fat Layer (Subcutaneous Tissue) Exposed: Yes Necrotic Quality: Adherent Slough Tendon Exposed: Yes Muscle Exposed: No Joint Exposed: No Bone Exposed: No Treatment Notes Wound #9 (Metatarsal head fifth) Wound Laterality: Left, Lateral Cleanser Soap and Water Discharge Instruction: Gently cleanse wound with antibacterial soap, rinse and pat dry prior to dressing wounds Peri-Wound Care Topical vashe Discharge Instruction: damp to dry daily Primary Dressing Gauze Discharge Instruction: moisten with Vashe and apply to wound Secondary Dressing Kerlix 4.5 x 4.1 (in/yd) Discharge Instruction: Apply Kerlix 4.5 x 4.1 (in/yd) as instructed Secured With Medipore T - 69M Medipore H Soft Cloth Surgical T ape ape, 2x2 (in/yd) Compression Wrap Compression Stockings Add-Ons Electronic Signature(s) Signed: 01/28/2023 5:01:27 PM By: [name] Entered By: Midge Aver on 01/27/2023 09:51:16 -------------------------------------------------------------------------------- Vitals Details Patient Name: Date of Service: [name] 01/27/2023 12:30 PM Medical Record Number: [id number] Patient Account Number: [id number] Date of Birth/Sex: Treating RN: [birth year] (42 y.o. [name] Primary Care Denaisha Swango:  [name] Other Clinician: Referring Stella Encarnacion: Treating Talma Aguillard/Extender: [name] in Treatment: 6 [name], [name] ([id number]) [id number] Page 12 of 12 Time Taken: 12:42 Temperature (F): 98.4 Height (in): 73 Pulse (bpm): 99 Weight (lbs): 330 Respiratory Rate (breaths/min): 16 Body Mass Index (BMI): 43.5 Blood Pressure (mmHg): 140/80 Reference Range: 80 - 120 mg / dl Electronic Signature(s) Signed: 01/27/2023 3:12:01 PM By: [name] Entered By: Midge Aver on 01/27/2023 12:12:01

## 2023-01-29 NOTE — Progress Notes (Signed)
[name], [name] ([id number]) [id number] Page 1 of 2 Visit Report for 01/29/2023 Arrival Information Details Patient Name: Date of Service: [name] 01/29/2023 10:00 A M Medical Record Number: [id number] Patient Account Number: [id number] Date of Birth/Sex: Treating RN: February 26, 1981 (42 y.o. M) Primary Care Kamera Dubas: [name] Other Clinician: Referring Normand Damron: Treating Gevork Ayyad/Extender: Greta Doom in Treatment: 17 Visit Information History Since Last Visit Added or deleted any medications: No Patient Arrived: Ambulatory Any new allergies or adverse reactions: No Arrival Time: 09:50 Had a fall or experienced change in No Accompanied By: self activities of daily living that may affect Transfer Assistance: None risk of falls: Patient Identification Verified: Yes Signs or symptoms of abuse/neglect since last visito No Secondary Verification Process Completed: Yes Hospitalized since last visit: No Patient Requires Transmission-Based Precautions: No Implantable device outside of the clinic excluding No Patient Has Alerts: Yes cellular tissue based products placed in the center Patient Alerts: Left ABI 1.03 05/13/22 since last visit: Right ABI .78 05/13/22 Pain Present Now: No Electronic Signature(s) Signed: 01/29/2023 12:17:33 PM By: Demetria Pore Entered By: Demetria Pore on 01/29/2023 09:15:20 -------------------------------------------------------------------------------- Encounter Discharge Information Details Patient Name: Date of Service: [name] 01/29/2023 10:00 A M Medical Record Number: [id number] Patient Account Number: [id number] Date of Birth/Sex: Treating RN: 1981-02-24 (42 y.o. M) Primary Care Averil Digman: [name] Other Clinician: Referring Kenadee Gates: Treating Rehanna Oloughlin/Extender: Greta Doom in Treatment: 17 Encounter Discharge Information Items Discharge  Condition: Stable Ambulatory Status: Ambulatory Discharge Destination: Home Transportation: Private Auto Accompanied By: self Schedule Follow-up Appointment: Yes Clinical Summary of Care: THEODRIC, GARBO ([id number]) [id number] Page 2 of 2 Electronic Signature(s) Signed: 01/29/2023 12:17:33 PM By: Demetria Pore Entered By: Demetria Pore on 01/29/2023 09:17:15 -------------------------------------------------------------------------------- Vitals Details Patient Name: Date of Service: [name] 01/29/2023 10:00 A M Medical Record Number: [id number] Patient Account Number: [id number] Date of Birth/Sex: Treating RN: [birth year] (42 y.o. M) Primary Care Cy Bresee: [name] Other Clinician: Referring Lessly Stigler: Treating Lakeithia Rasor/Extender: Greta Doom in Treatment: 17 Vital Signs Time Taken: 09:45 Temperature (F): 98.0 Height (in): 73 Pulse (bpm): 98 Weight (lbs): 330 Respiratory Rate (breaths/min): 16 Body Mass Index (BMI): 43.5 Blood Pressure (mmHg): 138/80 Capillary Blood Glucose (mg/dl): 160 Reference Range: 80 - 120 mg / dl Airway Pulse Oximetry (%): 99 Electronic Signature(s) Signed: 01/29/2023 12:17:33 PM By: Demetria Pore Entered By: Demetria Pore on 01/29/2023 09:15:22

## 2023-01-29 NOTE — Progress Notes (Signed)
[name], [name] ([id number]) [id number] Page 1 of 2 Visit Report for 01/28/2023 HBO Details Patient Name: Date of Service: [name], [name] 01/28/2023 10:00 A M Medical Record Number: [id number] Patient Account Number: [id number] Date of Birth/Sex: Treating RN: [birth year] (42 y.o. 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 HBO Treatment Details Treatment Number: 19 Patient Type: Outpatient Chamber Type: Monoplace Chamber Serial #: [id number] Treatment Protocol: 2.0 ATA with 90 minutes oxygen, with two 5 minute air breaks Treatment Details Compression Rate Down: 2.0 psi / minute De-Compression Rate Up: 1.5 psi / minute A breaks and breathing ir Compress Tx Pressure periods Decompress Decompress Begins Reached (leave unused spaces Begins Ends blank) Chamber Pressure (ATA 1 2 2 2 2 2  --2 1 ) Clock Time (24 hr) 9:56 10:07 10:37 10:42 11:12 11:18 - - 11:48 11:57 Treatment Length: 121 (minutes) Treatment Segments: 4 Vital Signs Capillary Blood Glucose Reference Range: 80 - 120 mg / dl HBO Diabetic Blood Glucose Intervention Range: <131 mg/dl or >782 mg/dl Time Vitals Blood Respiratory Capillary Blood Glucose Pulse Action Type: Pulse: Temperature: Taken: Pressure: Rate: Glucose (mg/dl): Meter #: Oximetry (%) Taken: Pre 09:45 126/80 94 16 98 237 99 Treatment Response Treatment Toleration: Well Treatment Completion Status: Treatment Completed without Adverse Event Electronic Signature(s) Signed: 01/28/2023 1:03:29 PM By: Demetria Pore Signed: 01/29/2023 2:02:35 PM By: Allen Derry PA-C Entered By: Demetria Pore  on 01/28/2023 13:02:46 [name] ([id number]) [id number] Page 2 of 2 -------------------------------------------------------------------------------- HBO Safety Checklist Details Patient Name: Date of Service: [name], [name] 01/28/2023 10:00 A M Medical Record Number: [id number] Patient Account Number: [id number] Date of Birth/Sex: Treating RN: 1980/09/04 (42 y.o. M) Primary Care Salman Wellen: [name] Other Clinician: Referring Ryshawn Sanzone: Treating Saumya Hukill/Extender: Greta Doom in Treatment: 17 HBO Safety Checklist Items Safety Checklist Consent Form Signed Patient voided / foley secured and emptied When did you last eato 01/28/23 Last dose of injectable or oral agent 01/28/23 Ostomy pouch emptied and vented if applicable NA All implantable devices assessed, documented and approved NA Intravenous access site secured and place NA Valuables secured Linens and cotton and cotton/polyester blend (less than 51% polyester) Personal oil-based products / skin lotions / body lotions removed Wigs or hairpieces removed NA Smoking or tobacco materials removed Books / newspapers / magazines / loose paper removed Cologne, aftershave, perfume and deodorant removed Jewelry removed (may wrap wedding band) Make-up removed NA Hair care products removed Battery operated devices (external) removed NA Heating patches and chemical warmers removed NA Titanium eyewear removed NA Nail polish cured greater than 10 hours NA Casting material cured greater than 10 hours NA Hearing aids removed NA Loose dentures or partials removed NA Prosthetics have been removed NA Patient demonstrates correct use of air break device (if applicable) Patient concerns have been addressed Patient grounding bracelet on and cord attached to chamber Specifics for Inpatients (complete in addition to above) Medication sheet sent with  patient NA Intravenous medications needed or due during therapy sent with patient NA Drainage tubes (e.g. nasogastric tube or chest tube secured and vented) Endotracheal or Tracheotomy tube secured NA Cuff deflated of air and inflated with saline NA Airway suctioned NA Electronic Signature(s) Signed: 01/28/2023 1:03:29 PM By: Demetria Pore Entered By: Demetria Pore on  01/28/2023 13:02:32 

## 2023-02-01 ENCOUNTER — Encounter: Payer: 59 | Admitting: Physician Assistant

## 2023-02-01 DIAGNOSIS — I10 Essential (primary) hypertension: Secondary | ICD-10-CM | POA: Diagnosis not present

## 2023-02-01 DIAGNOSIS — E08621 Diabetes mellitus due to underlying condition with foot ulcer: Secondary | ICD-10-CM | POA: Diagnosis not present

## 2023-02-01 DIAGNOSIS — L97522 Non-pressure chronic ulcer of other part of left foot with fat layer exposed: Secondary | ICD-10-CM | POA: Diagnosis not present

## 2023-02-01 DIAGNOSIS — M86672 Other chronic osteomyelitis, left ankle and foot: Secondary | ICD-10-CM | POA: Diagnosis not present

## 2023-02-01 LAB — GLUCOSE, CAPILLARY
Glucose-Capillary: 294 mg/dL — ABNORMAL HIGH (ref 70–99)
Glucose-Capillary: 262 mg/dL — ABNORMAL HIGH (ref 70–99)

## 2023-02-02 ENCOUNTER — Encounter: Payer: 59 | Admitting: Physician Assistant

## 2023-02-02 DIAGNOSIS — M86672 Other chronic osteomyelitis, left ankle and foot: Secondary | ICD-10-CM | POA: Diagnosis not present

## 2023-02-02 DIAGNOSIS — E08621 Diabetes mellitus due to underlying condition with foot ulcer: Secondary | ICD-10-CM | POA: Diagnosis not present

## 2023-02-02 DIAGNOSIS — L97522 Non-pressure chronic ulcer of other part of left foot with fat layer exposed: Secondary | ICD-10-CM | POA: Diagnosis not present

## 2023-02-02 DIAGNOSIS — I10 Essential (primary) hypertension: Secondary | ICD-10-CM | POA: Diagnosis not present

## 2023-02-02 LAB — GLUCOSE, CAPILLARY
Glucose-Capillary: 331 mg/dL — ABNORMAL HIGH (ref 70–99)
Glucose-Capillary: 276 mg/dL — ABNORMAL HIGH (ref 70–99)

## 2023-02-02 NOTE — Progress Notes (Signed)
MAZE, SHARAF ([id number]) [id number] Page 1 of 2 Visit Report for 02/02/2023 Arrival Information Details Patient Name: Date of Service: [name], [name] 02/02/2023 7:30 A M Medical Record Number: [id number] Patient Account Number: [id number] Date of Birth/Sex: Treating RN: 1981/04/03 (42 y.o. M) Primary Care Shawnta Schlegel: [name] Other Clinician: Referring Sai Moura: Treating Bridgett Hattabaugh/Extender: Greta Doom in Treatment: 17 Visit Information History Since Last Visit Added or deleted any medications: No Patient Arrived: Ambulatory Any new allergies or adverse reactions: No Arrival Time: 07:30 Had a fall or experienced change in No Accompanied By: self activities of daily living that may affect Transfer Assistance: None risk of falls: Patient Requires Transmission-Based Precautions: No Signs or symptoms of abuse/neglect since last visito No Patient Has Alerts: Yes Hospitalized since last visit: No Patient Alerts: Left ABI 1.03 05/13/22 Implantable device outside of the clinic excluding No Right ABI .78 05/13/22 cellular tissue based products placed in the center since last visit: Pain Present Now: No Electronic Signature(s) Signed: 02/02/2023 10:37:51 AM By: Demetria Pore Entered By: Demetria Pore on 02/02/2023 07:34:40 -------------------------------------------------------------------------------- Encounter Discharge Information Details Patient Name: Date of Service: [name] 02/02/2023 7:30 A M Medical Record Number: [id number] Patient Account Number: [id number] Date of Birth/Sex: Treating RN: [birth year] (42 y.o. M) Primary Care Draydon Clairmont: [name] Other Clinician: Referring Budd Freiermuth: Treating Jaiana Sheffer/Extender: Greta Doom in Treatment: 17 Encounter Discharge Information Items Discharge Condition: Stable Ambulatory Status: Ambulatory Discharge Destination:  Home Transportation: Other Accompanied By: self Schedule Follow-up Appointment: Yes Clinical Summary of Care: ADEDEJI, STROSNIDER ([id number]) [id number] Page 2 of 2 Electronic Signature(s) Signed: 02/02/2023 10:37:51 AM By: Demetria Pore Entered By: Demetria Pore on 02/02/2023 07:37:22 -------------------------------------------------------------------------------- Vitals Details Patient Name: Date of Service: [name] 02/02/2023 7:30 A M Medical Record Number: [id number] Patient Account Number: [id number] Date of Birth/Sex: Treating RN: 12/13/1980 (42 y.o. M) Primary Care Ilias Stcharles: [name] Other Clinician: Referring Adanely Reynoso: Treating Tekela Garguilo/Extender: Greta Doom in Treatment: 17 Vital Signs Time Taken: 07:30 Temperature (F): 97.8 Height (in): 73 Pulse (bpm): 106 Weight (lbs): 330 Respiratory Rate (breaths/min): 16 Body Mass Index (BMI): 43.5 Blood Pressure (mmHg): 130/80 Capillary Blood Glucose (mg/dl): 244 Reference Range: 80 - 120 mg / dl Airway Pulse Oximetry (%): 99 Electronic Signature(s) Signed: 02/02/2023 10:37:51 AM By: Demetria Pore Entered By: Demetria Pore on 02/02/2023 07:35:06

## 2023-02-02 NOTE — Progress Notes (Signed)
SUZANNE, MARSO ([id number]) [id number] Page 1 of 2 Visit Report for 02/01/2023 Arrival Information Details Patient Name: Date of Service: [name], [name] 02/01/2023 10:00 A M Medical Record Number: [id number] Patient Account Number: [id number] Date of Birth/Sex: Treating RN: 09-15-1980 (42 y.o. M) Primary Care Drexler Maland: [name] Other Clinician: Referring Graviel Payeur: Treating Carleta Woodrow/Extender: Greta Doom in Treatment: 17 Visit Information History Since Last Visit Added or deleted any medications: No Patient Arrived: Ambulatory Any new allergies or adverse reactions: No Arrival Time: 10:00 Had a fall or experienced change in No Accompanied By: self activities of daily living that may affect Transfer Assistance: None risk of falls: Patient Requires Transmission-Based Precautions: No Signs or symptoms of abuse/neglect since last visito No Patient Has Alerts: Yes Hospitalized since last visit: No Patient Alerts: Left ABI 1.03 05/13/22 Implantable device outside of the clinic excluding No Right ABI .78 05/13/22 cellular tissue based products placed in the center since last visit: Pain Present Now: No Electronic Signature(s) Signed: 02/02/2023 10:37:51 AM By: Demetria Pore Entered By: Demetria Pore on 02/01/2023 09:18:32 -------------------------------------------------------------------------------- Encounter Discharge Information Details Patient Name: Date of Service: [name] 02/01/2023 10:00 A M Medical Record Number: [id number] Patient Account Number: [id number] Date of Birth/Sex: Treating RN: [birth year] (42 y.o. M) Primary Care Thomos Domine: [name] Other Clinician: Referring Falcon Mccaskey: Treating Terrall Bley/Extender: Greta Doom in Treatment: 17 Encounter Discharge Information Items Discharge Condition: Stable Ambulatory Status: Ambulatory Discharge Destination:  Home Transportation: Ambulance Accompanied By: self Schedule Follow-up Appointment: Yes Clinical Summary of Care: [name], [name] [id number] Page 2 of 2 Electronic Signature(s) Signed: 02/02/2023 10:37:51 AM By: Francoise Ceo By: Demetria Pore on 02/01/2023 09:25:54 -------------------------------------------------------------------------------- Vitals Details Patient Name: Date of Service: [name] 02/01/2023 10:00 A M Medical Record Number: [id number] Patient Account Number: [id number] Date of Birth/Sex: Treating RN: 1981/03/06 (42 y.o. M) Primary Care Emalynn Clewis: [name] Other Clinician: Referring Yolonda Purtle: Treating Mercy Malena/Extender: Greta Doom in Treatment: 17 Vital Signs Time Taken: 10:00 Temperature (F): 98.0 Height (in): 73 Pulse (bpm): 103 Weight (lbs): 330 Respiratory Rate (breaths/min): 16 Body Mass Index (BMI): 43.5 Blood Pressure (mmHg): 118/80 Capillary Blood Glucose (mg/dl): 244 Reference Range: 80 - 120 mg / dl Airway Pulse Oximetry (%): 98 Electronic Signature(s) Signed: 02/02/2023 10:37:51 AM By: Demetria Pore Entered By: Demetria Pore on 02/01/2023 09:18:35

## 2023-02-03 ENCOUNTER — Encounter: Payer: 59 | Admitting: Internal Medicine

## 2023-02-03 ENCOUNTER — Encounter (HOSPITAL_BASED_OUTPATIENT_CLINIC_OR_DEPARTMENT_OTHER): Payer: 59 | Admitting: Internal Medicine

## 2023-02-03 DIAGNOSIS — E11621 Type 2 diabetes mellitus with foot ulcer: Secondary | ICD-10-CM

## 2023-02-03 DIAGNOSIS — L97512 Non-pressure chronic ulcer of other part of right foot with fat layer exposed: Secondary | ICD-10-CM

## 2023-02-03 DIAGNOSIS — M86672 Other chronic osteomyelitis, left ankle and foot: Secondary | ICD-10-CM

## 2023-02-03 DIAGNOSIS — I10 Essential (primary) hypertension: Secondary | ICD-10-CM | POA: Diagnosis not present

## 2023-02-03 DIAGNOSIS — L97528 Non-pressure chronic ulcer of other part of left foot with other specified severity: Secondary | ICD-10-CM

## 2023-02-03 DIAGNOSIS — L97522 Non-pressure chronic ulcer of other part of left foot with fat layer exposed: Secondary | ICD-10-CM | POA: Diagnosis not present

## 2023-02-03 DIAGNOSIS — E08621 Diabetes mellitus due to underlying condition with foot ulcer: Secondary | ICD-10-CM | POA: Diagnosis not present

## 2023-02-03 LAB — GLUCOSE, CAPILLARY
Glucose-Capillary: 305 mg/dL — ABNORMAL HIGH (ref 70–99)
Glucose-Capillary: 245 mg/dL — ABNORMAL HIGH (ref 70–99)

## 2023-02-03 NOTE — Progress Notes (Signed)
LORETO, VINCE ([id number]) [id number] Page 1 of 2 Visit Report for 02/03/2023 Arrival Information Details Patient Name: Date of Service: [name], [name] 02/03/2023 10:00 A M Medical Record Number: [id number] Patient Account Number: [id number] Date of Birth/Sex: Treating RN: 1980/06/28 (42 y.o. M) Primary Care Elfida Shimada: [name] Other Clinician: Referring Jsean Taussig: Treating Gaius Ishaq/Extender: Greta Doom in Treatment: 18 Visit Information History Since Last Visit Added or deleted any medications: No Patient Arrived: Ambulatory Any new allergies or adverse reactions: No Arrival Time: 10:00 Had a fall or experienced change in No Accompanied By: self activities of daily [name] that may affect Transfer Assistance: None risk of falls: Patient Requires Transmission-Based Precautions: No Signs or symptoms of abuse/neglect since last visito No Patient Has Alerts: Yes Hospitalized since last visit: No Patient Alerts: Left ABI 1.03 05/13/22 Implantable device outside of the clinic excluding No Right ABI .78 05/13/22 cellular tissue based products placed in the center since last visit: Pain Present Now: No Electronic Signature(s) Signed: 02/03/2023 1:12:04 PM By: Demetria Pore Entered By: Demetria Pore on 02/03/2023 10:08:53 -------------------------------------------------------------------------------- Encounter Discharge Information Details Patient Name: Date of Service: [name] 02/03/2023 10:00 A M Medical Record Number: [id number] Patient Account Number: [id number] Date of Birth/Sex: Treating RN: Dec 16, 1980 (42 y.o. M) Primary Care Jahdiel Krol: [name] Other Clinician: Referring Courtlyn Aki: Treating Franziska Podgurski/Extender: Greta Doom in Treatment: 18 Encounter Discharge Information Items Discharge Condition: Stable Ambulatory Status: Ambulatory Discharge Destination:  Home Transportation: Private Auto Accompanied By: self Schedule Follow-up Appointment: Yes Clinical Summary of Care: TENOCH, LOWARY ([id number]) [id number] Page 2 of 2 Electronic Signature(s) Signed: 02/03/2023 1:12:04 PM By: Demetria Pore Entered By: Demetria Pore on 02/03/2023 10:11:38 -------------------------------------------------------------------------------- Vitals Details Patient Name: Date of Service: [name] 02/03/2023 10:00 A M Medical Record Number: [id number] Patient Account Number: [id number] Date of Birth/Sex: Treating RN: January 10, 1981 (42 y.o. M) Primary Care Kaedin Hicklin: [name] Other Clinician: Referring Piper Hassebrock: Treating Reika Callanan/Extender: Greta Doom in Treatment: 18 Vital Signs Time Taken: 10:00 Temperature (F): 98.0 Height (in): 73 Pulse (bpm): 106 Weight (lbs): 330 Respiratory Rate (breaths/min): 16 Body Mass Index (BMI): 43.5 Blood Pressure (mmHg): 120/80 Capillary Blood Glucose (mg/dl): 347 Reference Range: 80 - 120 mg / dl Airway Pulse Oximetry (%): 99 Electronic Signature(s) Signed: 02/03/2023 1:12:04 PM By: Demetria Pore Entered By: Demetria Pore on 02/03/2023 10:10:13

## 2023-02-04 ENCOUNTER — Encounter: Payer: 59 | Admitting: Physician Assistant

## 2023-02-04 DIAGNOSIS — E08621 Diabetes mellitus due to underlying condition with foot ulcer: Secondary | ICD-10-CM | POA: Diagnosis not present

## 2023-02-04 DIAGNOSIS — Z89412 Acquired absence of left great toe: Secondary | ICD-10-CM | POA: Diagnosis not present

## 2023-02-04 DIAGNOSIS — I872 Venous insufficiency (chronic) (peripheral): Secondary | ICD-10-CM | POA: Diagnosis not present

## 2023-02-04 DIAGNOSIS — E11621 Type 2 diabetes mellitus with foot ulcer: Secondary | ICD-10-CM | POA: Diagnosis not present

## 2023-02-04 DIAGNOSIS — I10 Essential (primary) hypertension: Secondary | ICD-10-CM | POA: Diagnosis not present

## 2023-02-04 DIAGNOSIS — L97512 Non-pressure chronic ulcer of other part of right foot with fat layer exposed: Secondary | ICD-10-CM | POA: Diagnosis not present

## 2023-02-04 DIAGNOSIS — M86672 Other chronic osteomyelitis, left ankle and foot: Secondary | ICD-10-CM | POA: Diagnosis not present

## 2023-02-04 DIAGNOSIS — L97522 Non-pressure chronic ulcer of other part of left foot with fat layer exposed: Secondary | ICD-10-CM | POA: Diagnosis not present

## 2023-02-04 LAB — GLUCOSE, CAPILLARY
Glucose-Capillary: 295 mg/dL — ABNORMAL HIGH (ref 70–99)
Glucose-Capillary: 237 mg/dL — ABNORMAL HIGH (ref 70–99)

## 2023-02-04 NOTE — Progress Notes (Signed)
[name], [name] [id number] Page 1 of 2 Visit Report for 02/02/2023 HBO Details Patient Name: Date of Service: [name], [name] 02/02/2023 7:30 A M Medical Record Number: [id number] Patient Account Number: [id number] Date of Birth/Sex: Treating RN: [birth year] (42 y.o. 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 HBO Treatment Details Treatment Number: 22 Patient Type: Outpatient Chamber Type: Monoplace Chamber Serial #: [id number] Treatment Protocol: 2.0 ATA with 90 minutes oxygen, with two 5 minute air breaks Treatment Details Compression Rate Down: 2.0 psi / minute De-Compression Rate Up: 1.5 psi / minute A breaks and ir Compress Tx Pressure breathing periods Decompress Decompress Begins Reached (leave unused spaces Begins Ends blank) Chamber Pressure (ATA 1 2 2 2 2 2  --2 1 ) Clock Time (24 hr) 7:57 8:08 8:38 8:44 9:14 9:19 - - 9:49 9:59 Treatment Length: 122 (minutes) Treatment Segments: 4 Vital Signs Capillary Blood Glucose Reference Range: 80 - 120 mg / dl HBO Diabetic Blood Glucose Intervention Range: <131 mg/dl or >244 mg/dl Time Vitals Blood Respiratory Capillary Blood Glucose Pulse Action Type: Pulse: Temperature: Taken: Pressure: Rate: Glucose (mg/dl): Meter #: Oximetry (%) Taken: Pre 07:30 130/80 106 16 97.8 331 1 99 non per protocol Post 09:59 120/80 98 16 98 276 1 98 non per protocol Treatment Response Treatment Toleration: Well Treatment Completion Status: Treatment Completed without Adverse Event Electronic Signature(s) Signed: 02/02/2023 10:37:51 AM By: Demetria Pore Signed: 02/04/2023 4:31:22 PM By: Allen Derry PA-C Entered By: Demetria Pore on 02/02/2023 07:36:31 [name] ([id number]) [id number] Page 2 of 2 -------------------------------------------------------------------------------- HBO Safety Checklist Details Patient Name: Date of Service: [name], [name] 02/02/2023 7:30 A M Medical Record Number: [id number] Patient Account Number: [id number] Date of Birth/Sex: Treating RN: [birth year] (42 y.o. M) Primary Care Bernardo Brayman: [name] Other Clinician: Referring Lazette Estala: Treating Hardie Veltre/Extender: Greta Doom in Treatment: 17 HBO Safety Checklist Items Safety Checklist Consent Form Signed Patient voided / foley secured and emptied When did you last eato 02/02/23 Last dose of injectable or oral agent 02/02/23 Ostomy pouch emptied and vented if applicable NA All implantable devices assessed, documented and approved NA Intravenous access site secured and place NA Valuables secured Linens and cotton and cotton/polyester blend (less than 51% polyester) Personal oil-based products / skin lotions / body lotions removed Wigs or hairpieces removed NA Smoking or tobacco materials removed NA Books / newspapers / magazines / loose paper removed Cologne, aftershave, perfume and deodorant removed Jewelry removed (may wrap wedding band) Make-up removed NA Hair care products removed Battery operated devices (external) removed NA Heating patches and chemical warmers removed NA Titanium eyewear removed NA Nail polish cured greater than 10 hours NA Casting material cured greater than 10 hours NA Hearing aids removed NA Loose dentures or partials removed NA Prosthetics have been removed NA Patient demonstrates correct use of air break device (if applicable) Patient concerns have been addressed Patient grounding bracelet on and cord attached to chamber Specifics for  Inpatients (complete in addition to above) Medication sheet sent with patient NA Intravenous medications needed or due during therapy sent with patient NA Drainage tubes (e.g. nasogastric tube or chest tube secured and vented) NA Endotracheal or Tracheotomy tube secured NA Cuff deflated of air and inflated with saline  NA Airway suctioned NA Electronic Signature(s) Signed: 02/02/2023 10:37:51 AM By: Demetria Pore Entered By: Demetria Pore on 02/02/2023 07:35:10

## 2023-02-04 NOTE — Progress Notes (Signed)
[name], [name] ([id number]) [id number] Page 1 of 2 Visit Report for 02/03/2023 HBO Details Patient Name: Date of Service: [name], [name] 02/03/2023 10:00 A M Medical Record Number: [id number] Patient Account Number: [id number] Date of Birth/Sex: Treating RN: 05-Oct-1980 (42 y.o. 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 HBO Treatment Details Treatment Number: 23 Patient Type: Outpatient Chamber Type: Monoplace Chamber Serial #: [id number] Treatment Protocol: 2.0 ATA with 90 minutes oxygen, with two 5 minute air breaks Treatment Details Compression Rate Down: 2.0 psi / minute De-Compression Rate Up: 1.5 psi / minute A breaks and breathing ir Compress Tx Pressure periods Decompress Decompress Begins Reached (leave unused spaces Begins Ends blank) Chamber Pressure (ATA 1 2 2 2 2 2  --2 1 ) Clock Time (24 hr) 10:04 10:14 10:44 10:50 11:20 11:25 - - 11:55 12:05 Treatment Length: 121 (minutes) Treatment Segments: 4 Vital Signs Capillary Blood Glucose Reference Range: 80 - 120 mg / dl HBO Diabetic Blood Glucose Intervention Range: <131 mg/dl or >160 mg/dl Time Vitals Blood Respiratory Capillary Blood Glucose Pulse Action Type: Pulse: Temperature: Taken: Pressure: Rate: Glucose (mg/dl): Meter #: Oximetry (%) Taken: Pre 10:00 120/80 106 16 98 305 1 99 non per protocol Post 12:05 138/80 99 16 97.8 245 1 99 non per protocol Treatment Response Treatment Toleration: Well Treatment Completion Status: Treatment Completed without Adverse Event HBO Attestation I certify that I supervised this HBO treatment in  accordance with Medicare guidelines. A trained emergency response team is readily available per Yes hospital policies and procedures. Continue HBOT as ordered. Yes Electronic Signature(s) Signed: 02/03/2023 4:47:58 PM By: Geralyn Corwin DO Signed: 02/04/2023 4:31:22 PM By: Allen Derry PA-C Previous Signature: 02/03/2023 1:12:04 PM Version By: Demetria Pore Entered By: Geralyn Corwin on 02/03/2023 13:37:35 [name] ([id number]) [id number] Page 2 of 2 -------------------------------------------------------------------------------- HBO Safety Checklist Details Patient Name: Date of Service: [name], [name] 02/03/2023 10:00 A M Medical Record Number: [id number] Patient Account Number: [id number] Date of Birth/Sex: Treating RN: December 21, 1980 (42 y.o. M) Primary Care Emberlin Verner: [name] Other Clinician: Referring Dagny Fiorentino: Treating Kareena Arrambide/Extender: Greta Doom in Treatment: 18 HBO Safety Checklist Items Safety Checklist Consent Form Signed Patient voided / foley secured and emptied When did you last eato 02/03/23 Last dose of injectable or oral agent 02/03/23 Ostomy pouch emptied and vented if applicable NA All implantable devices assessed, documented and approved NA Intravenous access site secured and place NA Valuables secured Linens and cotton and cotton/polyester blend (less than 51% polyester) Personal oil-based products / skin lotions / body lotions removed Wigs or hairpieces removed NA Smoking or tobacco materials removed NA Books / newspapers / magazines / loose paper removed Cologne, aftershave, perfume and deodorant removed Jewelry removed (may wrap wedding band) Make-up removed NA Hair care products removed Battery operated devices (external) removed NA Heating patches and chemical warmers removed NA Titanium eyewear removed NA Nail polish cured greater than 10 hours NA Casting material cured  greater than 10 hours NA Hearing aids removed NA Loose dentures or partials removed NA Prosthetics have been removed NA Patient demonstrates correct use of air break device (if applicable) Patient concerns have been addressed Patient grounding bracelet on and cord attached to chamber Specifics for Inpatients (complete in addition to  above) Medication sheet sent with patient NA Intravenous medications needed or due during therapy sent with patient NA Drainage tubes (e.g. nasogastric tube or chest tube secured and vented) NA Endotracheal or Tracheotomy tube secured NA Cuff deflated of air and inflated with saline NA Airway suctioned NA Electronic Signature(s) Signed: 02/03/2023 1:12:04 PM By: Demetria Pore Entered By: Demetria Pore on 02/03/2023 10:10:17

## 2023-02-04 NOTE — Progress Notes (Signed)
[name], [name] ([id number]) [id number] Page 1 of 2 Visit Report for 02/04/2023 HBO Details Patient Name: Date of Service: [name], [name] 02/04/2023 10:00 A M Medical Record Number: [id number] Patient Account Number: [id number] Date of Birth/Sex: Treating RN: 12-12-1980 (42 y.o. 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 HBO Treatment Details Treatment Number: 24 Patient Type: Outpatient Chamber Type: Monoplace Chamber Serial #: [id number] Treatment Protocol: 2.0 ATA with 90 minutes oxygen, with two 5 minute air breaks Treatment Details Compression Rate Down: 2.0 psi / minute De-Compression Rate Up: 1.5 psi / minute A breaks and breathing ir Compress Tx Pressure periods Decompress Decompress Begins Reached (leave unused spaces Begins Ends blank) Chamber Pressure (ATA 1 2 2 2 2 2  --2 1 ) Clock Time (24 hr) 10:06 10:16 10:47 10:52 11:22 11:27 - - 12:05 12:07 Treatment Length: 121 (minutes) Treatment Segments: 4 Vital Signs Capillary Blood Glucose Reference Range: 80 - 120 mg / dl HBO Diabetic Blood Glucose Intervention Range: <131 mg/dl or >253 mg/dl Time Vitals Blood Respiratory Capillary Blood Glucose Pulse Action Type: Pulse: Temperature: Taken: Pressure: Rate: Glucose (mg/dl): Meter #: Oximetry (%) Taken: Pre 10:00 138/80 94 16 98 295 1 99 non per protocol Post 12:07 126/80 96 16 97.8 237 1 99 non per protocol Treatment Response Treatment Toleration: Well Treatment Completion Status: Treatment Completed without Adverse Event Electronic Signature(s) Signed: 02/04/2023 1:16:29 PM By: Demetria Pore Signed: 02/04/2023 4:31:22 PM By: Allen Derry PA-C Entered By: Demetria Pore on 02/04/2023 10:15:41 [name] ([id number]) [id number] Page 2 of 2 -------------------------------------------------------------------------------- HBO Safety Checklist Details Patient Name: Date of Service: [name], [name] 02/04/2023 10:00 A M Medical Record Number: [id number] Patient Account Number: [id number] Date of Birth/Sex: Treating RN: 04-27-1981 (42 y.o. M) Primary Care Lynna Zamorano: [name] Other Clinician: Referring Thi Klich: Treating Liseth Wann/Extender: Greta Doom in Treatment: 18 HBO Safety Checklist Items Safety Checklist Consent Form Signed Patient voided / foley secured and emptied When did you last eato 02/04/23 Last dose of injectable or oral agent 02/04/23 Ostomy pouch emptied and vented if applicable NA All implantable devices assessed, documented and approved NA Intravenous access site secured and place NA Valuables secured Linens and cotton and cotton/polyester blend (less than 51% polyester) Personal oil-based products / skin lotions / body lotions removed Wigs or hairpieces removed NA Smoking or tobacco materials removed NA Books / newspapers / magazines / loose paper removed Cologne, aftershave, perfume and deodorant removed Jewelry removed (may wrap wedding band) Make-up removed NA Hair care products removed Battery operated devices (external) removed NA Heating patches and chemical warmers removed NA Titanium eyewear removed NA Nail polish cured greater than 10 hours NA Casting material cured greater than 10 hours NA Hearing aids removed NA Loose dentures or partials removed NA Prosthetics have been removed NA Patient demonstrates correct use of air break device (if applicable) Patient concerns have been addressed Patient grounding bracelet on and cord attached to chamber Specifics for  Inpatients (complete in addition to above) Medication sheet sent with patient NA Intravenous medications needed or due during therapy sent with patient NA Drainage tubes (e.g. nasogastric tube or chest tube secured and vented) NA Endotracheal or Tracheotomy tube secured NA Cuff deflated of air and inflated with saline  NA Airway suctioned NA Electronic Signature(s) Signed: 02/04/2023 1:16:29 PM By: Demetria Pore Entered By: Demetria Pore on 02/04/2023 10:15:04

## 2023-02-04 NOTE — Progress Notes (Signed)
DASHAN, KINLOCK ([id number]) [id number] Page 1 of 2 Visit Report for 02/01/2023 HBO Details Patient Name: Date of Service: [name], [name] 02/01/2023 10:00 A M Medical Record Number: [id number] Patient Account Number: [id number] Date of Birth/Sex: Treating RN: 06-09-1980 (42 y.o. 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 HBO Treatment Details Treatment Number: 21 Patient Type: Outpatient Chamber Type: Monoplace Chamber Serial #: [id number] Treatment Protocol: 2.0 ATA with 90 minutes oxygen, with two 5 minute air breaks Treatment Details Compression Rate Down: 2.0 psi / minute De-Compression Rate Up: 1.5 psi / minute A breaks and breathing ir Compress Tx Pressure periods Decompress Decompress Begins Reached (leave unused spaces Begins Ends blank) Chamber Pressure (ATA 1 2 2 2 2 2  --2 1 ) Clock Time (24 hr) 10:05 10:16 10:46 10:51 11:21 11:26 - - 11:06 12:06 Treatment Length: 121 (minutes) Treatment Segments: 4 Vital Signs Capillary Blood Glucose Reference Range: 80 - 120 mg / dl HBO Diabetic Blood Glucose Intervention Range: <131 mg/dl or >027 mg/dl Time Vitals Blood Respiratory Capillary Blood Glucose Pulse Action Type: Pulse: Temperature: Taken: Pressure: Rate: Glucose (mg/dl): Meter #: Oximetry (%) Taken: Pre 10:00 118/80 103 16 98 294 1 98 non per protocol Post 12:06 124/80 98 16 97.8 262 1 98 non per protocol Treatment Response Treatment Toleration: Well Treatment Completion Status: Treatment Completed without Adverse Event Electronic Signature(s) Signed: 02/02/2023 10:37:51 AM By: Demetria Pore Signed: 02/04/2023 4:31:22 PM By: Allen Derry PA-C Entered By: Demetria Pore on 02/01/2023 09:25:28 [name] ([id number]) [id number] Page 2 of 2 -------------------------------------------------------------------------------- HBO Safety Checklist Details Patient Name: Date of Service: [name], [name] 02/01/2023 10:00 A M Medical Record Number: [id number] Patient Account Number: [id number] Date of Birth/Sex: Treating RN: [birth year] (42 y.o. M) Primary Care Malani Lees: [name] Other Clinician: Referring Wilmina Maxham: Treating Yazhini Mcaulay/Extender: Greta Doom in Treatment: 17 HBO Safety Checklist Items Safety Checklist Consent Form Signed Patient voided / foley secured and emptied When did you last eato 02/01/23 Last dose of injectable or oral agent 02/01/23 Ostomy pouch emptied and vented if applicable All implantable devices assessed, documented and approved NA Intravenous access site secured and place NA Valuables secured Linens and cotton and cotton/polyester blend (less than 51% polyester) Personal oil-based products / skin lotions / body lotions removed Wigs or hairpieces removed NA Smoking or tobacco materials removed NA Books / newspapers / magazines / loose paper removed Cologne, aftershave, perfume and deodorant removed Jewelry removed (may wrap wedding band) Make-up removed NA Hair care products removed Battery operated devices (external) removed NA Heating patches and chemical warmers removed NA Titanium eyewear removed NA Nail polish cured greater than 10 hours NA Casting material cured greater than 10 hours NA Hearing aids removed NA Loose dentures or partials removed NA Prosthetics have been removed NA Patient demonstrates correct use of air break device (if applicable) Patient concerns have been addressed Patient grounding bracelet on and cord attached to chamber Specifics for Inpatients  (complete in addition to above) Medication sheet sent with patient NA Intravenous medications needed or due during therapy sent with patient NA Drainage tubes (e.g. nasogastric tube or chest tube secured and vented) NA Endotracheal or Tracheotomy tube secured NA Cuff deflated of air and inflated with saline NA  Airway suctioned NA Electronic Signature(s) Signed: 02/02/2023 10:37:51 AM By: Demetria Pore Entered By: Demetria Pore on 02/01/2023 09:18:38

## 2023-02-04 NOTE — Progress Notes (Signed)
CORYON, BRUEGGER ([id number]) [id number] Page 1 of 2 Visit Report for 02/04/2023 Arrival Information Details Patient Name: Date of Service: MAVRIC, SLAVEY 02/04/2023 10:00 A M Medical Record Number: [id number] Patient Account Number: [id number] Date of Birth/Sex: Treating RN: Mar 19, 1981 (42 y.o. M) Primary Care Taryll Reichenberger: [name] Other Clinician: Referring Yukari Flax: Treating Ica Daye/Extender: Greta Doom in Treatment: 18 Visit Information History Since Last Visit Added or deleted any medications: No Patient Arrived: Ambulatory Any new allergies or adverse reactions: No Arrival Time: 10:00 Had a fall or experienced change in No Accompanied By: self activities of daily living that may affect Transfer Assistance: None risk of falls: Patient Identification Verified: Yes Signs or symptoms of abuse/neglect since last visito No Secondary Verification Process Completed: Yes Hospitalized since last visit: No Patient Requires Transmission-Based Precautions: No Implantable device outside of the clinic excluding No Patient Has Alerts: Yes cellular tissue based products placed in the center Patient Alerts: Left ABI 1.03 05/13/22 since last visit: Right ABI .78 05/13/22 Pain Present Now: No Electronic Signature(s) Signed: 02/04/2023 1:16:29 PM By: Demetria Pore Entered By: Demetria Pore on 02/04/2023 13:14:56 -------------------------------------------------------------------------------- Encounter Discharge Information Details Patient Name: Date of Service: [name] 02/04/2023 10:00 A M Medical Record Number: [id number] Patient Account Number: [id number] Date of Birth/Sex: Treating RN: August 06, 1980 (42 y.o. M) Primary Care Borna Wessinger: [name] Other Clinician: Referring Vinette Crites: Treating Renso Swett/Extender: Greta Doom in Treatment: 18 Encounter Discharge Information Items Discharge  Condition: Stable Ambulatory Status: Ambulatory Discharge Destination: Home Transportation: Private Auto Accompanied By: self Schedule Follow-up Appointment: Yes Clinical Summary of Care: SEAGER, SANDBERG ([id number]) [id number] Page 2 of 2 Electronic Signature(s) Signed: 02/04/2023 1:16:29 PM By: Demetria Pore Entered By: Demetria Pore on 02/04/2023 13:16:04 -------------------------------------------------------------------------------- Vitals Details Patient Name: Date of Service: [name] 02/04/2023 10:00 A M Medical Record Number: [id number] Patient Account Number: [id number] Date of Birth/Sex: Treating RN: 1981/02/15 (42 y.o. M) Primary Care Azam Gervasi: [name] Other Clinician: Referring Dywane Peruski: Treating Payslee Bateson/Extender: Greta Doom in Treatment: 18 Vital Signs Time Taken: 10:00 Temperature (F): 98.0 Height (in): 73 Pulse (bpm): 94 Weight (lbs): 330 Respiratory Rate (breaths/min): 16 Body Mass Index (BMI): 43.5 Blood Pressure (mmHg): 138/80 Capillary Blood Glucose (mg/dl): 366 Reference Range: 80 - 120 mg / dl Airway Pulse Oximetry (%): 99 Electronic Signature(s) Signed: 02/04/2023 1:16:29 PM By: Demetria Pore Entered By: Demetria Pore on 02/04/2023 13:14:59

## 2023-02-05 ENCOUNTER — Encounter: Payer: 59 | Admitting: Physician Assistant

## 2023-02-05 DIAGNOSIS — L97522 Non-pressure chronic ulcer of other part of left foot with fat layer exposed: Secondary | ICD-10-CM | POA: Diagnosis not present

## 2023-02-05 DIAGNOSIS — E08621 Diabetes mellitus due to underlying condition with foot ulcer: Secondary | ICD-10-CM | POA: Diagnosis not present

## 2023-02-05 DIAGNOSIS — M86672 Other chronic osteomyelitis, left ankle and foot: Secondary | ICD-10-CM | POA: Diagnosis not present

## 2023-02-05 DIAGNOSIS — I10 Essential (primary) hypertension: Secondary | ICD-10-CM | POA: Diagnosis not present

## 2023-02-05 LAB — GLUCOSE, CAPILLARY
Glucose-Capillary: 269 mg/dL — ABNORMAL HIGH (ref 70–99)
Glucose-Capillary: 241 mg/dL — ABNORMAL HIGH (ref 70–99)

## 2023-02-05 NOTE — Progress Notes (Signed)
[name], [name] ([id number]) [id number] Page 1 of 2 Visit Report for 02/05/2023 HBO Details Patient Name: Date of Service: [name], [name] 02/05/2023 10:00 A M Medical Record Number: [id number] Patient Account Number: [id number] Date of Birth/Sex: Treating RN: 1980/12/07 (42 y.o. 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 HBO Treatment Details Treatment Number: 25 Patient Type: Outpatient Chamber Type: Monoplace Chamber Serial #: [id number] Treatment Protocol: 2.0 ATA with 90 minutes oxygen, with two 5 minute air breaks Treatment Details Compression Rate Down: 2.0 psi / minute De-Compression Rate Up: 1.5 psi / minute A breaks and breathing ir Compress Tx Pressure periods Decompress Decompress Begins Reached (leave unused spaces Begins Ends blank) Chamber Pressure (ATA 1 2 2 2 2 2  --2 1 ) Clock Time (24 hr) 10:04 10:14 10:45 10:50 11:20 11:25 - - 11:55 12:06 Treatment Length: 122 (minutes) Treatment Segments: 4 Vital Signs Capillary Blood Glucose Reference Range: 80 - 120 mg / dl HBO Diabetic Blood Glucose Intervention Range: <131 mg/dl or >413 mg/dl Time Vitals Blood Respiratory Capillary Blood Glucose Pulse Action Type: Pulse: Temperature: Taken: Pressure: Rate: Glucose (mg/dl): Meter #: Oximetry (%) Taken: Pre 10:00 138/80 90 16 98 269 1 99 non per protocol Post 12:06 134/80 98 16 98 241 1 99 non per protocol Treatment Response Treatment Toleration: Well Treatment Completion Status: Treatment Completed without Adverse Event Electronic Signature(s) Signed: 02/05/2023 12:17:37 PM By: Demetria Pore Signed: 02/05/2023 12:33:21 PM By: Allen Derry PA-C Entered By: Demetria Pore on 02/05/2023 12:16:35 [name] ([id number]) [id number] Page 2 of 2 -------------------------------------------------------------------------------- HBO Safety Checklist Details Patient Name: Date of Service: [name], [name] 02/05/2023 10:00 A M Medical Record Number: [id number] Patient Account Number: [id number] Date of Birth/Sex: Treating RN: [birth year] (42 y.o. M) Primary Care Ashya Nicolaisen: [name] Other Clinician: Referring Taffany Heiser: Treating Kishawn Pickar/Extender: Greta Doom in Treatment: 18 HBO Safety Checklist Items Safety Checklist Consent Form Signed Patient voided / foley secured and emptied When did you last eato 02/05/23 Last dose of injectable or oral agent 02/05/23 Ostomy pouch emptied and vented if applicable NA All implantable devices assessed, documented and approved NA Intravenous access site secured and place NA Valuables secured Linens and cotton and cotton/polyester blend (less than 51% polyester) Personal oil-based products / skin lotions / body lotions removed Wigs or hairpieces removed NA Smoking or tobacco materials removed NA Books / newspapers / magazines / loose paper removed Cologne, aftershave, perfume and deodorant removed Jewelry removed (may wrap wedding band) Make-up removed NA Hair care products removed Battery operated devices (external) removed NA Heating patches and chemical warmers removed NA Titanium eyewear removed NA Nail polish cured greater than 10 hours NA Casting material cured greater than 10 hours NA Hearing aids removed NA Loose dentures or partials removed NA Prosthetics have been removed NA Patient demonstrates correct use of air break device (if applicable) Patient concerns have been addressed Patient grounding bracelet on and cord attached to chamber Specifics for  Inpatients (complete in addition to above) Medication sheet sent with patient NA Intravenous medications needed or due during therapy sent with patient NA Drainage tubes (e.g. nasogastric tube or chest tube secured and vented) NA Endotracheal or Tracheotomy tube secured NA Cuff deflated of air and inflated with saline  NA Airway suctioned NA Electronic Signature(s) Signed: 02/05/2023 12:17:37 PM By: Demetria Pore Entered By: Demetria Pore on 02/05/2023 16:10:96

## 2023-02-05 NOTE — Progress Notes (Signed)
Assessment Details Patient Name: Date of Service: [name], [name] 02/03/2023 12:30 PM Medical Record Number: [id number] Patient Account Number: [id number] Date of Birth/Sex: Treating RN: 1981-02-28 (42 y.o. 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 Wound Status: Open Wounding Event: Pressure Injury Comorbid Hypertension, Peripheral Venous Disease, Type II Diabetes, Date Acquired: 12/27/2022 History: Osteomyelitis Weeks Of Treatment: 1 Clustered Wound: No Photos [name], [name] ([id number]) [id number].pdf Page 8 of 10 Wound Measurements Length: (cm) 1.7 Width: (cm) 0.2 Depth: (cm) 0.7 Area: (cm) 0.267 Volume: (cm) 0.187 % Reduction in Area: -238% % Reduction in Volume: -503.2% Epithelialization: None Wound Description Classification: Grade 2 Exudate Amount: Medium Exudate Type: Serosanguineous Exudate Color: red, brown Wound Bed Granulation Amount: None Present (0%) Exposed Structure Necrotic Amount: None Present (0%) Fascia Exposed: No Fat Layer (Subcutaneous Tissue) Exposed: No Tendon Exposed: No Muscle Exposed: No Joint Exposed: No Bone Exposed: No Treatment Notes Wound #11 (Amputation Site - Toe) Wound Laterality: Right Cleanser Soap and Water Discharge Instruction: Gently cleanse wound with antibacterial soap, rinse and pat dry prior to dressing wounds Peri-Wound Care Topical Primary Dressing Hydrofera [name] Ready Transfer Foam, 4x5 (in/in) Discharge Instruction: Apply Hydrofera [name] Ready to wound bed as directed Secondary Dressing Kerlix 4.5 x 4.1 (in/yd) Discharge Instruction: Apply Kerlix 4.5 x 4.1 (in/yd) as instructed Secured With Medipore T - 22M Medipore H Soft Cloth Surgical T ape ape, 2x2 (in/yd) Compression Wrap Compression Stockings Add-Ons Electronic Signature(s) Signed: 02/05/2023 12:41:22 PM By: [name] Aver MSN RN CNS WTA Entered By: [name] on 02/03/2023 12:47:22 [name] ([id number]) [id number] Page 9 of  10 -------------------------------------------------------------------------------- Wound Assessment Details Patient Name: Date of Service: [name], [name] 02/03/2023 12:30 PM Medical Record Number: [id number] Patient Account Number: [id number] Date of Birth/Sex: Treating RN: [birth year] (42 y.o. [name] Primary Care [name]: [name] Other Clinician: Referring [name]: Treating [name]/Extender: [name] in Treatment: 18 Wound Status Wound Number: 9 Primary Diabetic Wound/Ulcer of the Lower Extremity Etiology: Wound Location: Left, Lateral Metatarsal head fifth Wound Status: Open Wounding Event: Blister Comorbid Hypertension, Peripheral Venous Disease, Type II Diabetes, Date Acquired: 10/07/2022 History: Osteomyelitis Weeks Of Treatment: 15 Clustered Wound: No Photos Wound Measurements Length: (cm) 0.4 Width: (cm) 0.2 Depth: (cm) 0.5 Area: (cm) 0.063 Volume: (cm) 0.031 % Reduction in Area: 98.5% % Reduction in Volume: 96.4% Wound Description Classification: Grade 3 Exudate Amount: Medium Exudate Type: Serosanguineous Exudate Color: red, brown Treatment Notes Wound #9 (Metatarsal head fifth) Wound Laterality: Left, Lateral Cleanser Soap and Water Discharge Instruction: Gently cleanse wound with antibacterial soap, rinse and pat dry prior to dressing wounds Peri-Wound Care Topical vashe Discharge Instruction: damp to dry daily Primary Dressing Gauze Discharge Instruction: moisten with Vashe and apply to wound [name] ([id number]) [id number] Page 10 of 10 Secondary Dressing Kerlix 4.5 x 4.1 (in/yd) Discharge Instruction: Apply Kerlix 4.5 x 4.1 (in/yd) as instructed Secured With Medipore T - 22M Medipore H Soft Cloth Surgical T ape ape, 2x2 (in/yd) Compression Wrap Compression Stockings Add-Ons Electronic Signature(s) Signed: 02/05/2023 12:41:22 PM By: [name] Aver MSN RN  CNS WTA Entered By: [name] on 02/03/2023 12:48:14 -------------------------------------------------------------------------------- Vitals Details Patient Name: Date of Service: [name] 02/03/2023 12:30 PM Medical Record Number: [id number] Patient Account Number: [id number] Date of Birth/Sex: Treating RN:  [name], [name] ([id number]) [id number] Page 1 of 10 Visit Report for 02/03/2023 Arrival Information Details Patient Name: Date of Service: [name], [name] 02/03/2023 12:30 PM Medical Record Number: [id number] Patient Account Number: [id number] Date of Birth/Sex: Treating RN: 11/13/1980 (42 y.o. [name] Primary Care [name]: [name] Other Clinician: Referring Jeramy Dimmick: Treating Coda Mathey/Extender: [name] in Treatment: 18 Visit Information History Since Last Visit Added or deleted any medications: No Patient Arrived: Ambulatory Any new allergies or adverse reactions: No Arrival Time: 12:31 Has Dressing in Place as Prescribed: Yes Accompanied By: self Pain Present Now: No Transfer Assistance: None Patient Identification Verified: Yes Secondary Verification Process Completed: Yes Patient Requires Transmission-Based Precautions: No Patient Has Alerts: Yes Patient Alerts: Left ABI 1.03 05/13/22 Right ABI .78 05/13/22 Electronic Signature(s) Signed: 02/05/2023 12:41:22 PM By: [name] Aver MSN RN CNS WTA Entered By: [name] on 02/03/2023 12:31:54 -------------------------------------------------------------------------------- Clinic Level of Care Assessment Details Patient Name: Date of Service: [name], [name] 02/03/2023 12:30 PM Medical Record Number: [id number] Patient Account Number: [id number] Date of Birth/Sex: Treating RN: Oct 19, 1980 (42 y.o. [name] Primary Care Karlynn Furrow: [name] Other Clinician: Referring Nickey Canedo: Treating Issaiah Seabrooks/Extender: [name] in Treatment: 18 Clinic Level of Care Assessment Items TOOL 4 Quantity Score X- 1 0 Use when only an EandM is performed on FOLLOW-UP visit ASSESSMENTS - Nursing Assessment / Reassessment X- 1 10 Reassessment of Co-morbidities (includes updates in patient status) X- 1 5 Reassessment  of Adherence to Treatment Plan ASSESSMENTS - Wound and Skin A ssessment / Reassessment []  - 0 Simple Wound Assessment / Reassessment - one wound ELIOT, MALBURG C ([id number]) [id number] Page 2 of 10 X- 3 5 Complex Wound Assessment / Reassessment - multiple wounds []  - 0 Dermatologic / Skin Assessment (not related to wound area) ASSESSMENTS - Focused Assessment []  - 0 Circumferential Edema Measurements - multi extremities []  - 0 Nutritional Assessment / Counseling / Intervention []  - 0 Lower Extremity Assessment (monofilament, tuning fork, pulses) []  - 0 Peripheral Arterial Disease Assessment (using hand held doppler) ASSESSMENTS - Ostomy and/or Continence Assessment and Care []  - 0 Incontinence Assessment and Management []  - 0 Ostomy Care Assessment and Management (repouching, etc.) PROCESS - Coordination of Care []  - 0 Simple Patient / Family Education for ongoing care X- 1 20 Complex (extensive) Patient / Family Education for ongoing care X- 1 10 Staff obtains Chiropractor, Records, T Results / Process Orders est []  - 0 Staff telephones HHA, Nursing Homes / Clarify orders / etc []  - 0 Routine Transfer to another Facility (non-emergent condition) []  - 0 Routine Hospital Admission (non-emergent condition) []  - 0 New Admissions / Manufacturing engineer / Ordering NPWT Apligraf, etc. , []  - 0 Emergency Hospital Admission (emergent condition) []  - 0 Simple Discharge Coordination X- 1 15 Complex (extensive) Discharge Coordination PROCESS - Special Needs []  - 0 Pediatric / Minor Patient Management []  - 0 Isolation Patient Management []  - 0 Hearing / Language / Visual special needs []  - 0 Assessment of Community assistance (transportation, D/C planning, etc.) []  - 0 Additional assistance / Altered mentation []  - 0 Support Surface(s) Assessment (bed, cushion, seat, etc.) INTERVENTIONS - Wound Cleansing / Measurement []  - 0 Simple Wound  Cleansing - one wound X- 3 5 Complex Wound Cleansing - multiple wounds []  - 0 Wound Imaging (photographs - any number of wounds) []  - 0 Wound Tracing (instead of photographs) []  - 0 Simple Wound Measurement - one wound X- 3  [name], [name] ([id number]) [id number] Page 1 of 10 Visit Report for 02/03/2023 Arrival Information Details Patient Name: Date of Service: [name], [name] 02/03/2023 12:30 PM Medical Record Number: [id number] Patient Account Number: [id number] Date of Birth/Sex: Treating RN: 11/13/1980 (42 y.o. [name] Primary Care [name]: [name] Other Clinician: Referring Jeramy Dimmick: Treating Coda Mathey/Extender: [name] in Treatment: 18 Visit Information History Since Last Visit Added or deleted any medications: No Patient Arrived: Ambulatory Any new allergies or adverse reactions: No Arrival Time: 12:31 Has Dressing in Place as Prescribed: Yes Accompanied By: self Pain Present Now: No Transfer Assistance: None Patient Identification Verified: Yes Secondary Verification Process Completed: Yes Patient Requires Transmission-Based Precautions: No Patient Has Alerts: Yes Patient Alerts: Left ABI 1.03 05/13/22 Right ABI .78 05/13/22 Electronic Signature(s) Signed: 02/05/2023 12:41:22 PM By: [name] Aver MSN RN CNS WTA Entered By: [name] on 02/03/2023 12:31:54 -------------------------------------------------------------------------------- Clinic Level of Care Assessment Details Patient Name: Date of Service: [name], [name] 02/03/2023 12:30 PM Medical Record Number: [id number] Patient Account Number: [id number] Date of Birth/Sex: Treating RN: Oct 19, 1980 (42 y.o. [name] Primary Care Karlynn Furrow: [name] Other Clinician: Referring Nickey Canedo: Treating Issaiah Seabrooks/Extender: [name] in Treatment: 18 Clinic Level of Care Assessment Items TOOL 4 Quantity Score X- 1 0 Use when only an EandM is performed on FOLLOW-UP visit ASSESSMENTS - Nursing Assessment / Reassessment X- 1 10 Reassessment of Co-morbidities (includes updates in patient status) X- 1 5 Reassessment  of Adherence to Treatment Plan ASSESSMENTS - Wound and Skin A ssessment / Reassessment []  - 0 Simple Wound Assessment / Reassessment - one wound ELIOT, MALBURG C ([id number]) [id number] Page 2 of 10 X- 3 5 Complex Wound Assessment / Reassessment - multiple wounds []  - 0 Dermatologic / Skin Assessment (not related to wound area) ASSESSMENTS - Focused Assessment []  - 0 Circumferential Edema Measurements - multi extremities []  - 0 Nutritional Assessment / Counseling / Intervention []  - 0 Lower Extremity Assessment (monofilament, tuning fork, pulses) []  - 0 Peripheral Arterial Disease Assessment (using hand held doppler) ASSESSMENTS - Ostomy and/or Continence Assessment and Care []  - 0 Incontinence Assessment and Management []  - 0 Ostomy Care Assessment and Management (repouching, etc.) PROCESS - Coordination of Care []  - 0 Simple Patient / Family Education for ongoing care X- 1 20 Complex (extensive) Patient / Family Education for ongoing care X- 1 10 Staff obtains Chiropractor, Records, T Results / Process Orders est []  - 0 Staff telephones HHA, Nursing Homes / Clarify orders / etc []  - 0 Routine Transfer to another Facility (non-emergent condition) []  - 0 Routine Hospital Admission (non-emergent condition) []  - 0 New Admissions / Manufacturing engineer / Ordering NPWT Apligraf, etc. , []  - 0 Emergency Hospital Admission (emergent condition) []  - 0 Simple Discharge Coordination X- 1 15 Complex (extensive) Discharge Coordination PROCESS - Special Needs []  - 0 Pediatric / Minor Patient Management []  - 0 Isolation Patient Management []  - 0 Hearing / Language / Visual special needs []  - 0 Assessment of Community assistance (transportation, D/C planning, etc.) []  - 0 Additional assistance / Altered mentation []  - 0 Support Surface(s) Assessment (bed, cushion, seat, etc.) INTERVENTIONS - Wound Cleansing / Measurement []  - 0 Simple Wound  Cleansing - one wound X- 3 5 Complex Wound Cleansing - multiple wounds []  - 0 Wound Imaging (photographs - any number of wounds) []  - 0 Wound Tracing (instead of photographs) []  - 0 Simple Wound Measurement - one wound X- 3  Assessment Details Patient Name: Date of Service: [name], [name] 02/03/2023 12:30 PM Medical Record Number: [id number] Patient Account Number: [id number] Date of Birth/Sex: Treating RN: 1981-02-28 (42 y.o. 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 Wound Status: Open Wounding Event: Pressure Injury Comorbid Hypertension, Peripheral Venous Disease, Type II Diabetes, Date Acquired: 12/27/2022 History: Osteomyelitis Weeks Of Treatment: 1 Clustered Wound: No Photos [name], [name] ([id number]) [id number].pdf Page 8 of 10 Wound Measurements Length: (cm) 1.7 Width: (cm) 0.2 Depth: (cm) 0.7 Area: (cm) 0.267 Volume: (cm) 0.187 % Reduction in Area: -238% % Reduction in Volume: -503.2% Epithelialization: None Wound Description Classification: Grade 2 Exudate Amount: Medium Exudate Type: Serosanguineous Exudate Color: red, brown Wound Bed Granulation Amount: None Present (0%) Exposed Structure Necrotic Amount: None Present (0%) Fascia Exposed: No Fat Layer (Subcutaneous Tissue) Exposed: No Tendon Exposed: No Muscle Exposed: No Joint Exposed: No Bone Exposed: No Treatment Notes Wound #11 (Amputation Site - Toe) Wound Laterality: Right Cleanser Soap and Water Discharge Instruction: Gently cleanse wound with antibacterial soap, rinse and pat dry prior to dressing wounds Peri-Wound Care Topical Primary Dressing Hydrofera [name] Ready Transfer Foam, 4x5 (in/in) Discharge Instruction: Apply Hydrofera [name] Ready to wound bed as directed Secondary Dressing Kerlix 4.5 x 4.1 (in/yd) Discharge Instruction: Apply Kerlix 4.5 x 4.1 (in/yd) as instructed Secured With Medipore T - 22M Medipore H Soft Cloth Surgical T ape ape, 2x2 (in/yd) Compression Wrap Compression Stockings Add-Ons Electronic Signature(s) Signed: 02/05/2023 12:41:22 PM By: [name] Aver MSN RN CNS WTA Entered By: [name] on 02/03/2023 12:47:22 [name] ([id number]) [id number] Page 9 of  10 -------------------------------------------------------------------------------- Wound Assessment Details Patient Name: Date of Service: [name], [name] 02/03/2023 12:30 PM Medical Record Number: [id number] Patient Account Number: [id number] Date of Birth/Sex: Treating RN: [birth year] (42 y.o. [name] Primary Care [name]: [name] Other Clinician: Referring [name]: Treating [name]/Extender: [name] in Treatment: 18 Wound Status Wound Number: 9 Primary Diabetic Wound/Ulcer of the Lower Extremity Etiology: Wound Location: Left, Lateral Metatarsal head fifth Wound Status: Open Wounding Event: Blister Comorbid Hypertension, Peripheral Venous Disease, Type II Diabetes, Date Acquired: 10/07/2022 History: Osteomyelitis Weeks Of Treatment: 15 Clustered Wound: No Photos Wound Measurements Length: (cm) 0.4 Width: (cm) 0.2 Depth: (cm) 0.5 Area: (cm) 0.063 Volume: (cm) 0.031 % Reduction in Area: 98.5% % Reduction in Volume: 96.4% Wound Description Classification: Grade 3 Exudate Amount: Medium Exudate Type: Serosanguineous Exudate Color: red, brown Treatment Notes Wound #9 (Metatarsal head fifth) Wound Laterality: Left, Lateral Cleanser Soap and Water Discharge Instruction: Gently cleanse wound with antibacterial soap, rinse and pat dry prior to dressing wounds Peri-Wound Care Topical vashe Discharge Instruction: damp to dry daily Primary Dressing Gauze Discharge Instruction: moisten with Vashe and apply to wound [name] ([id number]) [id number] Page 10 of 10 Secondary Dressing Kerlix 4.5 x 4.1 (in/yd) Discharge Instruction: Apply Kerlix 4.5 x 4.1 (in/yd) as instructed Secured With Medipore T - 22M Medipore H Soft Cloth Surgical T ape ape, 2x2 (in/yd) Compression Wrap Compression Stockings Add-Ons Electronic Signature(s) Signed: 02/05/2023 12:41:22 PM By: [name] Aver MSN RN  CNS WTA Entered By: [name] on 02/03/2023 12:48:14 -------------------------------------------------------------------------------- Vitals Details Patient Name: Date of Service: [name] 02/03/2023 12:30 PM Medical Record Number: [id number] Patient Account Number: [id number] Date of Birth/Sex: Treating RN:  [birth year] (41 y.o. [name] Primary Care Lilley Hubble: [name] Other Clinician: Referring Jarryd Gratz: Treating Emerita Berkemeier/Extender: [name] in Treatment: 18 Vital Signs Time Taken: 12:32 Temperature (F): 98.1 Height (in): 73 Pulse (bpm): 99 Weight (lbs): 330 Respiratory Rate (breaths/min): 16 Body Mass Index (BMI): 43.5 Blood Pressure (mmHg): 138/80 Reference Range: 80 - 120 mg / dl Airway Pulse Oximetry (%): 99 Electronic Signature(s) Signed: 02/05/2023 12:41:22 PM By: [name] Aver MSN RN CNS WTA Entered By: [name] on 02/03/2023 12:34:47

## 2023-02-05 NOTE — Progress Notes (Signed)
TALTON, ROEDL ([id number]) [id number] Page 1 of 2 Visit Report for 02/05/2023 Arrival Information Details Patient Name: Date of Service: [name], [name] 02/05/2023 10:00 A M Medical Record Number: [id number] Patient Account Number: [id number] Date of Birth/Sex: Treating RN: 1980/10/21 (42 y.o. M) Primary Care Silver Achey: [name] Other Clinician: Referring Kambree Krauss: Treating Tinaya Ceballos/Extender: Greta Doom in Treatment: 18 Visit Information History Since Last Visit Added or deleted any medications: No Patient Arrived: Ambulatory Any new allergies or adverse reactions: No Arrival Time: 10:00 Had a fall or experienced change in No Accompanied By: self activities of daily living that may affect Transfer Assistance: None risk of falls: Patient Identification Verified: Yes Signs or symptoms of abuse/neglect since last visito No Secondary Verification Process Completed: Yes Hospitalized since last visit: No Patient Requires Transmission-Based Precautions: No Implantable device outside of the clinic excluding No Patient Has Alerts: Yes cellular tissue based products placed in the center Patient Alerts: Left ABI 1.03 05/13/22 since last visit: Right ABI .78 05/13/22 Pain Present Now: No Electronic Signature(s) Signed: 02/05/2023 12:17:37 PM By: Demetria Pore Entered By: Demetria Pore on 02/05/2023 12:05:56 -------------------------------------------------------------------------------- Encounter Discharge Information Details Patient Name: Date of Service: [name] 02/05/2023 10:00 A M Medical Record Number: [id number] Patient Account Number: [id number] Date of Birth/Sex: Treating RN: [birth year] (42 y.o. M) Primary Care Allie Ousley: [name] Other Clinician: Referring Nathanal Hermiz: Treating Myelle Poteat/Extender: Greta Doom in Treatment: 18 Encounter Discharge Information Items Discharge  Condition: Stable Ambulatory Status: Ambulatory Discharge Destination: Home Transportation: Private Auto Accompanied By: self Schedule Follow-up Appointment: Yes Clinical Summary of Care: DAMARRI, VANTUYL ([id number]) [id number] Page 2 of 2 Electronic Signature(s) Signed: 02/05/2023 12:17:37 PM By: Demetria Pore Entered By: Demetria Pore on 02/05/2023 12:17:21 -------------------------------------------------------------------------------- Vitals Details Patient Name: Date of Service: [name] 02/05/2023 10:00 A M Medical Record Number: [id number] Patient Account Number: [id number] Date of Birth/Sex: Treating RN: February 28, 1981 (42 y.o. M) Primary Care Tabithia Stroder: [name] Other Clinician: Referring Myrissa Chipley: Treating Freja Faro/Extender: Greta Doom in Treatment: 18 Vital Signs Time Taken: 10:00 Temperature (F): 98.0 Height (in): 73 Pulse (bpm): 90 Weight (lbs): 330 Respiratory Rate (breaths/min): 16 Body Mass Index (BMI): 43.5 Blood Pressure (mmHg): 138/80 Capillary Blood Glucose (mg/dl): 976 Reference Range: 80 - 120 mg / dl Airway Pulse Oximetry (%): 99 Electronic Signature(s) Signed: 02/05/2023 12:17:37 PM By: Demetria Pore Entered By: Demetria Pore on 02/05/2023 12:06:11

## 2023-02-08 ENCOUNTER — Encounter: Payer: 59 | Admitting: Physician Assistant

## 2023-02-08 DIAGNOSIS — L97522 Non-pressure chronic ulcer of other part of left foot with fat layer exposed: Secondary | ICD-10-CM | POA: Diagnosis not present

## 2023-02-08 DIAGNOSIS — I10 Essential (primary) hypertension: Secondary | ICD-10-CM | POA: Diagnosis not present

## 2023-02-08 DIAGNOSIS — M86672 Other chronic osteomyelitis, left ankle and foot: Secondary | ICD-10-CM | POA: Diagnosis not present

## 2023-02-08 DIAGNOSIS — E08621 Diabetes mellitus due to underlying condition with foot ulcer: Secondary | ICD-10-CM | POA: Diagnosis not present

## 2023-02-08 LAB — GLUCOSE, CAPILLARY
Glucose-Capillary: 206 mg/dL — ABNORMAL HIGH (ref 70–99)
Glucose-Capillary: 207 mg/dL — ABNORMAL HIGH (ref 70–99)

## 2023-02-08 NOTE — Progress Notes (Signed)
LIAHM, TREMONTI ([id number]) [id number] Page 1 of 2 Visit Report for 02/08/2023 Arrival Information Details Patient Name: Date of Service: [name], [name] 02/08/2023 10:00 A M Medical Record Number: [id number] Patient Account Number: [id number] Date of Birth/Sex: Treating RN: March 06, 1981 (42 y.o. M) Primary Care [facility]: [name] Other Clinician: Referring Lakiesha Ralphs: Treating Avi Kerschner/Extender: Greta Doom in Treatment: 18 Visit Information History Since Last Visit Added or deleted any medications: No Patient Arrived: Ambulatory Any new allergies or adverse reactions: No Arrival Time: 10:00 Had a fall or experienced change in No Accompanied By: self activities of daily living that may affect Transfer Assistance: None risk of falls: Patient Identification Verified: Yes Signs or symptoms of abuse/neglect since last visito No Secondary Verification Process Completed: Yes Hospitalized since last visit: No Patient Requires Transmission-Based Precautions: No Implantable device outside of the clinic excluding No Patient Has Alerts: Yes cellular tissue based products placed in the center Patient Alerts: Left ABI 1.03 05/13/22 since last visit: Right ABI .78 05/13/22 Pain Present Now: No Electronic Signature(s) Signed: 02/08/2023 12:15:47 PM By: Demetria Pore Entered By: Demetria Pore on 02/08/2023 08:48:22 -------------------------------------------------------------------------------- Encounter Discharge Information Details Patient Name: Date of Service: [name] 02/08/2023 10:00 A M Medical Record Number: [id number] Patient Account Number: [id number] Date of Birth/Sex: Treating RN: 20-Feb-1981 (42 y.o. M) Primary Care Lieutenant Abarca: [name] Other Clinician: Referring Filmore Molyneux: Treating Jamesen Stahnke/Extender: Greta Doom in Treatment: 18 Encounter Discharge Information Items Discharge  Condition: Stable Ambulatory Status: Ambulatory Discharge Destination: Home Transportation: Other Accompanied By: self Schedule Follow-up Appointment: Yes Clinical Summary of Care: PHILLIPPE, RAYMO ([id number]) [id number] Page 2 of 2 Electronic Signature(s) Signed: 02/08/2023 12:15:47 PM By: Demetria Pore Entered By: Demetria Pore on 02/08/2023 09:15:29 -------------------------------------------------------------------------------- Vitals Details Patient Name: Date of Service: [name] 02/08/2023 10:00 A M Medical Record Number: [id number] Patient Account Number: [id number] Date of Birth/Sex: Treating RN: 1980-07-29 (42 y.o. M) Primary Care Kammie Scioli: [name] Other Clinician: Referring Saumya Hukill: Treating Kinneth Fujiwara/Extender: Greta Doom in Treatment: 18 Vital Signs Time Taken: 10:00 Temperature (F): 97.8 Height (in): 73 Pulse (bpm): 111 Weight (lbs): 330 Respiratory Rate (breaths/min): 16 Body Mass Index (BMI): 43.5 Blood Pressure (mmHg): 130/80 Capillary Blood Glucose (mg/dl): 188 Reference Range: 80 - 120 mg / dl Airway Pulse Oximetry (%): 99 Electronic Signature(s) Signed: 02/08/2023 12:15:47 PM By: Demetria Pore Entered By: Demetria Pore on 02/08/2023 08:48:25

## 2023-02-08 NOTE — Progress Notes (Signed)
BEREN, LINNEMANN ([id number]) [id number] Page 1 of 2 Visit Report for 02/08/2023 HBO Details Patient Name: Date of Service: OSIE, ABONCE 02/08/2023 10:00 A M Medical Record Number: [id number] Patient Account Number: [id number] Date of Birth/Sex: Treating RN: Mar 12, 1981 (42 y.o. 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 HBO Treatment Details Treatment Number: 26 Patient Type: Outpatient Chamber Type: Monoplace Chamber Serial #: [id number] Treatment Protocol: 2.0 ATA with 90 minutes oxygen, with two 5 minute air breaks Treatment Details Compression Rate Down: 2.0 psi / minute De-Compression Rate Up: 1.5 psi / minute A breaks and breathing ir Compress Tx Pressure periods Decompress Decompress Begins Reached (leave unused spaces Begins Ends blank) Chamber Pressure (ATA 1 2 2 2 2 2  --2 1 ) Clock Time (24 hr) 10:07 10:18 10:48 10:54 11:24 11:29 - - 11:59 12:09 Treatment Length: 122 (minutes) Treatment Segments: 4 Vital Signs Capillary Blood Glucose Reference Range: 80 - 120 mg / dl HBO Diabetic Blood Glucose Intervention Range: <131 mg/dl or >782 mg/dl Time Vitals Blood Respiratory Capillary Blood Glucose Pulse Action Type: Pulse: Temperature: Taken: Pressure: Rate: Glucose (mg/dl): Meter #: Oximetry (%) Taken: Pre 10:00 130/80 111 16 97.8 206 1 99 non per protocol Post 12:09 134/80 100 16 98 161 1 99 non per protocol Treatment Response Treatment Toleration: Well Treatment Completion Status: Treatment Completed without Adverse Event Electronic Signature(s) Signed: 02/08/2023 12:15:47 PM By:  Demetria Pore Signed: 02/08/2023 12:32:57 PM By: Allen Derry PA-C Entered By: Demetria Pore on 02/08/2023 09:15:00 [name] ([id number]) [id number] Page 2 of 2 -------------------------------------------------------------------------------- HBO Safety Checklist Details Patient Name: Date of Service: DENARI, MANGLICMOT 02/08/2023 10:00 A M Medical Record Number: [id number] Patient Account Number: [id number] Date of Birth/Sex: Treating RN: [birth year] (42 y.o. M) Primary Care Kiron Osmun: [name] Other Clinician: Referring Myrl Bynum: Treating Rhetta Cleek/Extender: Greta Doom in Treatment: 18 HBO Safety Checklist Items Safety Checklist Consent Form Signed Patient voided / foley secured and emptied When did you last eato 02/08/23 Last dose of injectable or oral agent 02/08/23 Ostomy pouch emptied and vented if applicable NA All implantable devices assessed, documented and approved NA Intravenous access site secured and place NA Valuables secured Linens and cotton and cotton/polyester blend (less than 51% polyester) Personal oil-based products / skin lotions / body lotions removed Wigs or hairpieces removed NA Smoking or tobacco materials removed NA Books / newspapers / magazines / loose paper removed Cologne, aftershave, perfume and deodorant removed Jewelry removed (may wrap wedding band) Make-up removed NA Hair care products removed Battery operated devices (external) removed NA Heating patches and chemical warmers removed NA Titanium eyewear removed NA Nail polish cured greater than 10 hours NA Casting material cured greater than 10 hours NA Hearing aids removed NA Loose dentures or partials removed NA Prosthetics have been removed NA Patient demonstrates correct use of air break device (if applicable) Patient concerns have been addressed Patient grounding bracelet on and cord attached to chamber Specifics  for Inpatients (complete in addition to above) Medication sheet sent with patient NA Intravenous medications needed or due during therapy sent with patient NA Drainage tubes (e.g. nasogastric tube or chest tube secured and vented) NA Endotracheal or Tracheotomy tube secured NA Cuff deflated of air and inflated with saline  NA Airway suctioned NA Electronic Signature(s) Signed: 02/08/2023 12:15:47 PM By: Demetria Pore Entered By: Demetria Pore on 02/08/2023 82:95:62

## 2023-02-09 ENCOUNTER — Encounter: Payer: 59 | Admitting: Physician Assistant

## 2023-02-09 DIAGNOSIS — I10 Essential (primary) hypertension: Secondary | ICD-10-CM | POA: Diagnosis not present

## 2023-02-09 DIAGNOSIS — L97522 Non-pressure chronic ulcer of other part of left foot with fat layer exposed: Secondary | ICD-10-CM | POA: Diagnosis not present

## 2023-02-09 DIAGNOSIS — E08621 Diabetes mellitus due to underlying condition with foot ulcer: Secondary | ICD-10-CM | POA: Diagnosis not present

## 2023-02-09 DIAGNOSIS — M86672 Other chronic osteomyelitis, left ankle and foot: Secondary | ICD-10-CM | POA: Diagnosis not present

## 2023-02-09 LAB — GLUCOSE, CAPILLARY
Glucose-Capillary: 334 mg/dL — ABNORMAL HIGH (ref 70–99)
Glucose-Capillary: 247 mg/dL — ABNORMAL HIGH (ref 70–99)

## 2023-02-09 NOTE — Progress Notes (Signed)
KORD, DEMLOW ([id number]) [id number] Page 1 of 2 Visit Report for 02/09/2023 Arrival Information Details Patient Name: Date of Service: [name], [name] 02/09/2023 7:30 A M Medical Record Number: [id number] Patient Account Number: [id number] Date of Birth/Sex: Treating RN: March 26, 1981 (42 y.o. M) Primary Care Zakayla Martinec: [name] Other Clinician: Referring Camdyn Laden: Treating Nga Rabon/Extender: Greta Doom in Treatment: 18 Visit Information History Since Last Visit Added or deleted any medications: No Patient Arrived: Ambulatory Any new allergies or adverse reactions: No Arrival Time: 07:30 Had a fall or experienced change in No Accompanied By: self activities of daily living that may affect Transfer Assistance: None risk of falls: Patient Identification Verified: Yes Signs or symptoms of abuse/neglect since last visito No Secondary Verification Process Completed: Yes Hospitalized since last visit: No Patient Requires Transmission-Based Precautions: No Implantable device outside of the clinic excluding No Patient Has Alerts: Yes cellular tissue based products placed in the center Patient Alerts: Left ABI 1.03 05/13/22 since last visit: Right ABI .78 05/13/22 Pain Present Now: No Electronic Signature(s) Signed: 02/09/2023 2:30:04 PM By: Demetria Pore Entered By: Demetria Pore on 02/09/2023 11:00:52 -------------------------------------------------------------------------------- Encounter Discharge Information Details Patient Name: Date of Service: [name] 02/09/2023 7:30 A M Medical Record Number: [id number] Patient Account Number: [id number] Date of Birth/Sex: Treating RN: 30-Jul-1980 (42 y.o. M) Primary Care Gabriana Wilmott: [name] Other Clinician: Referring Towanda Hornstein: Treating Kaiya Boatman/Extender: Greta Doom in Treatment: 18 Encounter Discharge Information Items Discharge  Condition: Stable Ambulatory Status: Ambulatory Discharge Destination: Home Transportation: Private Auto Accompanied By: self Schedule Follow-up Appointment: Yes Clinical Summary of Care: [name], [name] ([id number]) [id number] Page 2 of 2 Electronic Signature(s) Signed: 02/09/2023 2:30:04 PM By: Demetria Pore Entered By: Demetria Pore on 02/09/2023 11:00:33 -------------------------------------------------------------------------------- Vitals Details Patient Name: Date of Service: [name] 02/09/2023 7:30 A M Medical Record Number: [id number] Patient Account Number: [id number] Date of Birth/Sex: Treating RN: 1980/09/06 (42 y.o. M) Primary Care Zaide Mcclenahan: [name] Other Clinician: Referring Natanel Snavely: Treating Daveion Robar/Extender: Greta Doom in Treatment: 18 Vital Signs Time Taken: 07:30 Temperature (F): 98.0 Height (in): 73 Pulse (bpm): 81 Weight (lbs): 330 Respiratory Rate (breaths/min): 16 Body Mass Index (BMI): 43.5 Blood Pressure (mmHg): 122/84 Capillary Blood Glucose (mg/dl): 643 Reference Range: 80 - 120 mg / dl Airway Pulse Oximetry (%): 99 Electronic Signature(s) Signed: 02/09/2023 2:30:04 PM By: Demetria Pore Entered By: Demetria Pore on 02/09/2023 11:00:56

## 2023-02-10 ENCOUNTER — Encounter: Payer: 59 | Admitting: Internal Medicine

## 2023-02-10 DIAGNOSIS — M86672 Other chronic osteomyelitis, left ankle and foot: Secondary | ICD-10-CM | POA: Diagnosis not present

## 2023-02-10 DIAGNOSIS — L97512 Non-pressure chronic ulcer of other part of right foot with fat layer exposed: Secondary | ICD-10-CM | POA: Diagnosis not present

## 2023-02-10 DIAGNOSIS — L97522 Non-pressure chronic ulcer of other part of left foot with fat layer exposed: Secondary | ICD-10-CM | POA: Diagnosis not present

## 2023-02-10 DIAGNOSIS — I10 Essential (primary) hypertension: Secondary | ICD-10-CM | POA: Diagnosis not present

## 2023-02-10 DIAGNOSIS — E11621 Type 2 diabetes mellitus with foot ulcer: Secondary | ICD-10-CM | POA: Diagnosis not present

## 2023-02-10 DIAGNOSIS — E08621 Diabetes mellitus due to underlying condition with foot ulcer: Secondary | ICD-10-CM | POA: Diagnosis not present

## 2023-02-10 LAB — GLUCOSE, CAPILLARY
Glucose-Capillary: 325 mg/dL — ABNORMAL HIGH (ref 70–99)
Glucose-Capillary: 324 mg/dL — ABNORMAL HIGH (ref 70–99)

## 2023-02-10 NOTE — Progress Notes (Signed)
[name], [name] ([id number]) [id number] Page 1 of 2 Visit Report for 02/10/2023 Arrival Information Details Patient Name: Date of Service: [name], [name] 02/10/2023 10:00 A M Medical Record Number: [id number] Patient Account Number: [id number] Date of Birth/Sex: Treating RN: Sep 29, 1980 (42 y.o. M) Primary Care Margherita Collyer: [name] Other Clinician: Referring Nellie Pester: Treating Marita Burnsed/Extender: [name], [name] in Treatment: 19 Visit Information History Since Last Visit Added or deleted any medications: No Patient Arrived: Ambulatory Any new allergies or adverse reactions: No Arrival Time: 10:00 Had a fall or experienced change in No Accompanied By: self activities of daily living that may affect Transfer Assistance: None risk of falls: Patient Identification Verified: Yes Signs or symptoms of abuse/neglect since last visito No Secondary Verification Process Completed: Yes Hospitalized since last visit: No Patient Requires Transmission-Based Precautions: No Implantable device outside of the clinic excluding No Patient Has Alerts: Yes cellular tissue based products placed in the center Patient Alerts: Left ABI 1.03 05/13/22 since last visit: Right ABI .78 05/13/22 Pain Present Now: No Electronic Signature(s) Signed: 02/10/2023 4:47:00 PM By: Demetria Pore Entered By: Demetria Pore on 02/10/2023 13:45:55 -------------------------------------------------------------------------------- Encounter Discharge Information Details Patient Name: Date of Service: [name] 02/10/2023 10:00 A M Medical Record Number: [id number] Patient Account Number: [id number] Date of Birth/Sex: Treating RN: 20-Jul-1980 (42 y.o. M) Primary Care Koah Chisenhall: [name] Other Clinician: Referring Leilan Bochenek: Treating Annica Marinello/Extender: [name], [name] in Treatment: 19 Encounter Discharge Information  Items Discharge Condition: Stable Ambulatory Status: Ambulatory Discharge Destination: Home Transportation: Private Auto Accompanied By: self Schedule Follow-up Appointment: Yes Clinical Summary of Care: ANDRA, MOTTON ([id number]) [id number] Page 2 of 2 Electronic Signature(s) Signed: 02/10/2023 4:47:00 PM By: Demetria Pore Entered By: Demetria Pore on 02/10/2023 13:46:41 -------------------------------------------------------------------------------- Vitals Details Patient Name: Date of Service: [name] 02/10/2023 10:00 A M Medical Record Number: [id number] Patient Account Number: [id number] Date of Birth/Sex: Treating RN: 1980-11-08 (42 y.o. M) Primary Care Tritia Endo: [name] Other Clinician: Referring Mame Twombly: Treating Lajoy Vanamburg/Extender: [name], [name] in Treatment: 19 Vital Signs Time Taken: 10:00 Temperature (F): 97.8 Height (in): 73 Pulse (bpm): 84 Weight (lbs): 330 Respiratory Rate (breaths/min): 16 Body Mass Index (BMI): 43.5 Blood Pressure (mmHg): 124/80 Capillary Blood Glucose (mg/dl): 440 Reference Range: 80 - 120 mg / dl Airway Pulse Oximetry (%): 99 Electronic Signature(s) Signed: 02/10/2023 4:47:00 PM By: Demetria Pore Entered By: Demetria Pore on 02/10/2023 13:45:57

## 2023-02-10 NOTE — Progress Notes (Signed)
[name] ([id number]) [id number] Page 1 of 2 Visit Report for 02/10/2023 HBO Details Patient Name: Date of Service: [name], [name] 02/10/2023 10:00 A M Medical Record Number: [id number] Patient Account Number: [id number] Date of Birth/Sex: Treating RN: 1981-03-25 (42 y.o. 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 HBO [name] Details [name] Number: [id number] Patient Type: Outpatient Chamber Type: Monoplace Chamber Serial #: [id number] [name] Protocol: 2.0 ATA with 90 minutes oxygen, with two 5 minute air breaks [name] Details Compression Rate Down: 2.0 psi / minute De-Compression Rate Up: 1.5 psi / minute A breaks and breathing ir Compress Tx Pressure periods Decompress Decompress Begins Reached (leave unused spaces Begins Ends blank) Chamber Pressure (ATA 1 2 2 2 2 2  --2 1 ) Clock Time (24 hr) 10:09 10:19 10:50 10:55 11:25 11:30 - - 12:00 12:10 [name] Length: 121 (minutes) [name] Segments: 4 Vital Signs Capillary Blood Glucose Reference Range: 80 - 120 mg / dl HBO Diabetic Blood Glucose Intervention Range: <131 mg/dl or >664 mg/dl Time Vitals Blood Respiratory Capillary Blood Glucose Pulse Action Type: Pulse: Temperature: Taken: Pressure: Rate: Glucose (mg/dl): Meter #: Oximetry (%) Taken: Pre 10:00 124/80 84 16 97.8 325 1 99 non per protocol Post 12:15 134/80 88 16 98.1 324 1 98 non per protocol [name] Response [name] Toleration: Well [name] Completion Status: [name] Completed without Adverse Event [name] Notes No concerns with hyperbaric [name].  The patient was also seen for wound care evaluation HBO Attestation I certify that I supervised this HBO [name] in accordance with Medicare guidelines. A trained emergency response team is readily available per Yes hospital policies and procedures. Continue HBOT as ordered. Yes Electronic Signature(s) Signed: 02/10/2023 5:02:29 PM By: [name] Najjar MD Previous Signature: 02/10/2023 4:47:00 PM Version By: Demetria Pore Entered By: [name] on 02/10/2023 14:01:51 [name] ([id number]) [id number] Page 2 of 2 -------------------------------------------------------------------------------- HBO Safety Checklist Details Patient Name: Date of Service: [name], [name] 02/10/2023 10:00 A M Medical Record Number: [id number] Patient Account Number: [id number] Date of Birth/Sex: Treating RN: 01-20-1981 (42 y.o. M) Primary Care Nini Cavan: [name] Other Clinician: Referring Stirling Orton: Treating Mashal Slavick/Extender: [name], [name] EL [name]: 19 HBO Safety Checklist Items Safety Checklist Consent Form Signed Patient voided / foley secured and emptied When did you last eato 02/10/23 Last dose of injectable or oral agent 02/10/23 Ostomy pouch emptied and vented if applicable NA All implantable devices assessed, documented and approved NA Intravenous access site secured and place NA Valuables secured Linens and cotton and cotton/polyester blend (less than 51% polyester) Personal oil-based products / skin lotions / body lotions removed Wigs or hairpieces removed NA Smoking or tobacco materials removed NA Books / newspapers / magazines / loose paper removed Cologne, aftershave, perfume and deodorant removed Jewelry removed (may wrap wedding band) Make-up removed NA Hair care products removed Battery operated devices (external) removed NA Heating patches and chemical warmers removed NA Titanium eyewear  removed NA Nail polish cured greater than 10 hours NA Casting material cured greater than 10 hours NA Hearing aids removed NA Loose dentures or partials removed NA Prosthetics have been removed NA Patient demonstrates correct use of air break device (if applicable) Patient concerns have been addressed  Patient grounding bracelet on and cord attached to chamber Specifics for Inpatients (complete in addition to above) Medication sheet sent with patient NA Intravenous medications needed or due during therapy sent with patient NA Drainage tubes (e.g. nasogastric tube or chest tube secured and vented) NA Endotracheal or Tracheotomy tube secured NA Cuff deflated of air and inflated with saline NA Airway suctioned NA Electronic Signature(s) Signed: 02/10/2023 4:47:00 PM By: Demetria Pore Entered By: Demetria Pore on 02/10/2023 13:46:01

## 2023-02-10 NOTE — Progress Notes (Signed)
ITSUO, PACIFICO ([id number]) [id number] Page 1 of 2 Visit Report for 02/09/2023 HBO Details Patient Name: Date of Service: [name], [name] 02/09/2023 7:30 A M Medical Record Number: [id number] Patient Account Number: [id number] Date of Birth/Sex: Treating RN: 1980/12/23 (42 y.o. 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 HBO Treatment Details Treatment Number: 27 Patient Type: Outpatient Chamber Type: Monoplace Chamber Serial #: [id number] Treatment Protocol: 2.0 ATA with 90 minutes oxygen, with two 5 minute air breaks Treatment Details Compression Rate Down: 2.5 psi / minute De-Compression Rate Up: 1.5 psi / minute A breaks and ir Compress Tx Pressure breathing periods Decompress Decompress Begins Reached (leave unused spaces Begins Ends blank) Chamber Pressure (ATA 1 2 2 2 2 2  --2 1 ) Clock Time (24 hr) 8:03 8:14 8:45 8:50 9:20 9:25 - - 9:54 10:04 Treatment Length: 121 (minutes) Treatment Segments: 4 Vital Signs Capillary Blood Glucose Reference Range: 80 - 120 mg / dl HBO Diabetic Blood Glucose Intervention Range: <131 mg/dl or >782 mg/dl Time Vitals Blood Respiratory Capillary Blood Glucose Pulse Action Type: Pulse: Temperature: Taken: Pressure: Rate: Glucose (mg/dl): Meter #: Oximetry (%) Taken: Pre 07:30 122/84 81 16 98 334 1 99 Post 10:04 124/80 80 16 98 247 1 98 Treatment Response Treatment Toleration: Well Treatment Completion Status: Treatment Completed without Adverse Event Electronic Signature(s) Signed: 02/09/2023 2:30:04 PM By: Demetria Pore Signed: 02/09/2023 5:07:51 PM By: Allen Derry PA-C Entered By: Demetria Pore on 02/09/2023 13:59:59 [name] ([id number]) [id number] Page 2 of 2 -------------------------------------------------------------------------------- HBO Safety Checklist Details Patient Name: Date of Service: [name], [name] 02/09/2023 7:30 A M Medical Record Number: [id number] Patient Account Number: [id number] Date of Birth/Sex: Treating RN: [birth year] (42 y.o. M) Primary Care Taimur Fier: [name] Other Clinician: Referring Nerine Pulse: Treating Bijou Easler/Extender: Greta Doom in Treatment: 18 HBO Safety Checklist Items Safety Checklist Consent Form Signed Patient voided / foley secured and emptied When did you last eato 02/09/23 Last dose of injectable or oral agent 02/09/23 Ostomy pouch emptied and vented if applicable NA All implantable devices assessed, documented and approved NA Intravenous access site secured and place NA Valuables secured Linens and cotton and cotton/polyester blend (less than 51% polyester) Personal oil-based products / skin lotions / body lotions removed Wigs or hairpieces removed NA Smoking or tobacco materials removed NA Books / newspapers / magazines / loose paper removed Cologne, aftershave, perfume and deodorant removed Jewelry removed (may wrap wedding band) Make-up removed NA Hair care products removed Battery operated devices (external) removed NA Heating patches and chemical warmers removed NA Titanium eyewear removed NA Nail polish cured greater than 10 hours NA Casting material cured greater than 10 hours Hearing aids removed NA Loose dentures or partials removed NA Prosthetics have been removed NA Patient demonstrates correct use of air break device (if applicable) Patient concerns have been addressed Patient grounding bracelet on and cord attached to chamber Specifics for Inpatients (complete in addition to above) Medication  sheet sent with patient NA Intravenous medications needed or due during therapy sent with patient NA Drainage tubes (e.g. nasogastric tube or chest tube secured and vented) NA Endotracheal or Tracheotomy tube secured NA Cuff deflated of air and inflated with saline NA Airway suctioned NA Electronic Signature(s) Signed:  02/09/2023 2:30:04 PM By: Demetria Pore Entered By: Demetria Pore on 02/09/2023 14:01:00

## 2023-02-11 ENCOUNTER — Encounter: Payer: 59 | Admitting: Physician Assistant

## 2023-02-11 DIAGNOSIS — M86672 Other chronic osteomyelitis, left ankle and foot: Secondary | ICD-10-CM | POA: Diagnosis not present

## 2023-02-11 DIAGNOSIS — E08621 Diabetes mellitus due to underlying condition with foot ulcer: Secondary | ICD-10-CM | POA: Diagnosis not present

## 2023-02-11 DIAGNOSIS — I10 Essential (primary) hypertension: Secondary | ICD-10-CM | POA: Diagnosis not present

## 2023-02-11 DIAGNOSIS — I872 Venous insufficiency (chronic) (peripheral): Secondary | ICD-10-CM | POA: Diagnosis not present

## 2023-02-11 DIAGNOSIS — Z89412 Acquired absence of left great toe: Secondary | ICD-10-CM | POA: Diagnosis not present

## 2023-02-11 DIAGNOSIS — E11621 Type 2 diabetes mellitus with foot ulcer: Secondary | ICD-10-CM | POA: Diagnosis not present

## 2023-02-11 DIAGNOSIS — L97512 Non-pressure chronic ulcer of other part of right foot with fat layer exposed: Secondary | ICD-10-CM | POA: Diagnosis not present

## 2023-02-11 DIAGNOSIS — L97522 Non-pressure chronic ulcer of other part of left foot with fat layer exposed: Secondary | ICD-10-CM | POA: Diagnosis not present

## 2023-02-11 LAB — GLUCOSE, CAPILLARY
Glucose-Capillary: 234 mg/dL — ABNORMAL HIGH (ref 70–99)
Glucose-Capillary: 244 mg/dL — ABNORMAL HIGH (ref 70–99)

## 2023-02-11 NOTE — Progress Notes (Signed)
DRACO, DONATE ([id number]) [id number] Page 1 of 2 Visit Report for 02/11/2023 Arrival Information Details Patient Name: Date of Service: [name], [name] 02/11/2023 10:00 A M Medical Record Number: [id number] Patient Account Number: [id number] Date of Birth/Sex: Treating RN: 21-Nov-1980 (42 y.o. M) Primary Care Ross Hefferan: [name] Other Clinician: Referring Daleyza Gadomski: Treating Jami Engelstad/Extender: Greta Doom in Treatment: 19 Visit Information History Since Last Visit Added or deleted any medications: No Patient Arrived: Ambulatory Any new allergies or adverse reactions: No Arrival Time: 10:25 Had a fall or experienced change in No Accompanied By: self activities of daily living that may affect Transfer Assistance: None risk of falls: Patient Identification Verified: Yes Signs or symptoms of abuse/neglect since last visito No Secondary Verification Process Completed: Yes Hospitalized since last visit: No Patient Requires Transmission-Based Precautions: No Pain Present Now: No Patient Has Alerts: Yes Patient Alerts: Left ABI 1.03 05/13/22 Right ABI .78 05/13/22 Electronic Signature(s) Signed: 02/11/2023 4:46:13 PM By: Demetria Pore Entered By: Demetria Pore on 02/11/2023 16:45:08 -------------------------------------------------------------------------------- Encounter Discharge Information Details Patient Name: Date of Service: [name] 02/11/2023 10:00 A M Medical Record Number: [id number] Patient Account Number: [id number] Date of Birth/Sex: Treating RN: 01/02/1981 (42 y.o. M) Primary Care Aaleigha Bozza: [name] Other Clinician: Referring Muntaha Vermette: Treating Waldon Sheerin/Extender: Greta Doom in Treatment: 19 Encounter Discharge Information Items Discharge Condition: Stable Ambulatory Status: Ambulatory Discharge Destination: Home Transportation: Private Auto Accompanied By:  self Schedule Follow-up Appointment: Yes Clinical Summary of Care: Electronic Signature(s) HARDIE, CLAMP ([id number]) [id number] Page 2 of 2 Signed: 02/11/2023 4:46:13 PM By: Demetria Pore Entered By: Demetria Pore on 02/11/2023 16:45:50 -------------------------------------------------------------------------------- Vitals Details Patient Name: Date of Service: [name] 02/11/2023 10:00 A M Medical Record Number: [id number] Patient Account Number: [id number] Date of Birth/Sex: Treating RN: 1980/12/09 (42 y.o. M) Primary Care Camaryn Lumbert: [name] Other Clinician: Referring Sahir Tolson: Treating Krystan Northrop/Extender: Greta Doom in Treatment: 19 Vital Signs Time Taken: 10:00 Temperature (F): 98.0 Height (in): 73 Pulse (bpm): 105 Weight (lbs): 330 Respiratory Rate (breaths/min): 16 Body Mass Index (BMI): 43.5 Blood Pressure (mmHg): 134/78 Capillary Blood Glucose (mg/dl): 202 Reference Range: 80 - 120 mg / dl Airway Pulse Oximetry (%): 99 Electronic Signature(s) Signed: 02/11/2023 4:46:13 PM By: Demetria Pore Entered By: Demetria Pore on 02/11/2023 16:45:10

## 2023-02-11 NOTE — Progress Notes (Signed)
ALIE, MORTON ([id number]) [id number] Page 1 of 9 Visit Report for 02/10/2023 Arrival Information Details Patient Name: Date of Service: MCNEAL, MILLET 02/10/2023 12:30 PM Medical Record Number: [id number] Patient Account Number: [id number] Date of Birth/Sex: Treating RN: September 03, 1980 (42 y.o. [name] Primary Care Weslynn Ke: [name] Other Clinician: Referring Narely Nobles: Treating Andreanna Mikolajczak/Extender: RO BSO [name], [name] in Treatment: 19 Visit Information History Since Last Visit Added or deleted any medications: No Patient Arrived: Ambulatory Any new allergies or adverse reactions: No Arrival Time: 12:32 Has Dressing in Place as Prescribed: Yes Accompanied By: self Pain Present Now: No Transfer Assistance: None Patient Requires Transmission-Based Precautions: No Patient Has Alerts: Yes Patient Alerts: Left ABI 1.03 05/13/22 Right ABI .78 05/13/22 Electronic Signature(s) Signed: 02/10/2023 5:31:20 PM By: [name] Entered By: Midge Aver on 02/10/2023 09:35:15 -------------------------------------------------------------------------------- Encounter Discharge Information Details Patient Name: Date of Service: [name] 02/10/2023 12:30 PM Medical Record Number: [id number] Patient Account Number: [id number] Date of Birth/Sex: Treating RN: 1980/07/09 (42 y.o. [name] Primary Care Jamare Vanatta: [name] Other Clinician: Referring Teruo Stilley: Treating Zafar Debrosse/Extender: [name], [name] in Treatment: 19 Encounter Discharge Information Items Post Procedure Vitals Discharge Condition: Stable Temperature (F): 98.1 Ambulatory Status: Ambulatory Pulse (bpm): 98 Discharge Destination: Home Respiratory Rate (breaths/min): 18 Transportation: Private Auto Blood Pressure (mmHg): 134/80 Accompanied By: self Schedule Follow-up Appointment:  Yes Clinical Summary of Care: Electronic Signature(s) Signed: 02/10/2023 5:31:20 PM By: [name] Entered By: Midge Aver on 02/10/2023 10:14:50 [name] ([id number]) [id number] Page 2 of 9 -------------------------------------------------------------------------------- Lower Extremity Assessment Details Patient Name: Date of Service: ALTAY, JACKETT 02/10/2023 12:30 PM Medical Record Number: [id number] Patient Account Number: [id number] Date of Birth/Sex: Treating RN: 11-15-1980 (42 y.o. [name] Primary Care Kassondra Geil: [name] Other Clinician: Referring Louden Houseworth: Treating Adilson Grafton/Extender: RO BSO [name], [name] in Treatment: 19 Electronic Signature(s) Signed: 02/10/2023 5:31:20 PM By: [name] Entered By: Midge Aver on 02/10/2023 09:49:32 -------------------------------------------------------------------------------- Multi Wound Chart Details Patient Name: Date of Service: [name] 02/10/2023 12:30 PM Medical Record Number: [id number] Patient Account Number: [id number] Date of Birth/Sex: Treating RN: 05-Jul-1980 (42 y.o. 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, Lateral Metatarsal head fifth Wound Location: Shear/Friction Pressure Injury Blister Wounding Event: Diabetic Wound/Ulcer of the Lower Diabetic Wound/Ulcer of the Lower Diabetic Wound/Ulcer of the Lower Primary Etiology: Extremity Extremity Extremity Hypertension, Peripheral Venous Hypertension, Peripheral  Venous Hypertension, Peripheral Venous Comorbid History: Disease, Type II Diabetes, Disease, Type II Diabetes, Disease, Type II Diabetes, Osteomyelitis Osteomyelitis Osteomyelitis KAIROS, BUNTING C ([id number]) [id number] Page 3 of 9 11/18/2022 12/27/2022 10/07/2022 Date Acquired: 12 2 16  Weeks of Treatment: Open Open Open Wound Status: No No No Wound Recurrence: 0.3x0.2x0.3 1x0.2x0.7 0x0.2x0.5 Measurements L x W x D (cm) 0.047 0.157 0.016 A (cm) : rea 0.014 0.11 0.008 Volume (cm) : 85.00% -98.70% 99.60% % Reduction in A rea: 85.10% -254.80% 99.10% % Reduction in Volume: Grade 1 Grade 2 Grade 3 Classification: Medium Medium Medium Exudate A mount: Serosanguineous Serosanguineous Serosanguineous Exudate Type: red, brown red, brown red,  Calla Kicks Weeks in Treatment: 19 Wound Status Wound Number: 9 Primary Diabetic Wound/Ulcer of the Lower Extremity Etiology: Wound Location: Left, Lateral Metatarsal head fifth Wound Status: Open Wounding Event: Blister Comorbid Hypertension, Peripheral Venous Disease, Type II Diabetes, Date Acquired: 10/07/2022 History: Osteomyelitis Weeks Of Treatment: 16 Clustered Wound: No Photos Wound Measurements Length: (cm) 0 Width: (cm) 0.2 Depth: (cm) 0.5 Area: (cm) 0.016 Volume: (cm) 0.008 % Reduction in Area: 99.6% % Reduction in Volume: 99.1% Wound Description Classification: Grade 3 Zarling, Jamile C ([id number]) Exudate Amount: Medium Exudate Type: Serosanguineous Exudate Color: red, brown [id number] Page 9 of 9 Treatment Notes Wound #9 (Metatarsal head fifth) Wound Laterality: Left, Lateral Cleanser Soap and Water Discharge Instruction: Gently cleanse wound with antibacterial soap, rinse and pat dry prior to dressing wounds Peri-Wound Care Topical Primary Dressing Endoform Natural, Non-fenestrated, 2x2 (in/in) Secondary Dressing Kerlix 4.5 x 4.1 (in/yd) Discharge Instruction: Apply Kerlix 4.5 x 4.1 (in/yd) as instructed Secured With Medipore T - 8M Medipore H Soft Cloth Surgical T ape ape, 2x2 (in/yd) Compression Wrap Compression Stockings Add-Ons Electronic Signature(s) Signed: 02/10/2023 5:31:20 PM By: [name] Entered By: Midge Aver on 02/10/2023  09:49:15 -------------------------------------------------------------------------------- Vitals Details Patient Name: Date of Service: [name] 02/10/2023 12:30 PM Medical Record Number: [id number] Patient Account Number: [id number] Date of Birth/Sex: Treating RN: 1980-12-13 (42 y.o. [name] Primary Care Chantele Corado: [name] Other Clinician: Referring Lynn Recendiz: Treating Hassan Blackshire/Extender: [name], [name] in Treatment: 19 Vital Signs Time Taken: 12:35 Temperature (F): 98.1 Height (in): 73 Pulse (bpm): 98 Weight (lbs): 330 Respiratory Rate (breaths/min): 18 Body Mass Index (BMI): 43.5 Blood Pressure (mmHg): 134/80 Reference Range: 80 - 120 mg / dl Electronic Signature(s) Signed: 02/10/2023 5:31:20 PM By: [name] Entered By: Midge Aver on 02/10/2023 09:36:02  brown Exudate Color: Large (67-100%) None Present (0%) N/A Granulation A mount: Red, Pink N/A N/A Granulation Quality: Small (1-33%) None Present (0%) N/A Necrotic A mount: Fat Layer (Subcutaneous Tissue): Yes Fascia: No N/A Exposed Structures: Fascia: No Fat Layer (Subcutaneous Tissue): No Tendon: No Tendon: No Muscle: No Muscle: No Joint: No Joint: No Bone: No Bone: No None None N/A Epithelialization: Treatment Notes Electronic Signature(s) Signed: 02/10/2023 5:31:20 PM By: [name] Entered By: Midge Aver on 02/10/2023 09:56:42 -------------------------------------------------------------------------------- Multi-Disciplinary Care Plan Details Patient Name: Date of Service: [name] 02/10/2023 12:30 PM Medical Record Number: [id number] Patient Account Number: [id number] Date of Birth/Sex: Treating RN: 12/23/1980 (42 y.o. [name] Primary Care Zakary Kimura: [name] Other Clinician: Referring Jaylan Duggar: Treating Ransome Helwig/Extender: RO BSO [name], [name] in Treatment: 19 Active Inactive Wound/Skin Impairment Nursing  Diagnoses: Knowledge deficit related to ulceration/compromised skin integrity Goals: Patient/caregiver will verbalize understanding of skin care regimen Date Initiated: 09/30/2022 Date Inactivated: 12/02/2022 Target Resolution Date: 10/31/2022 Goal Status: Met Ulcer/skin breakdown will have a volume reduction of 30% by week 4 Date Initiated: 09/30/2022 Date Inactivated: 12/02/2022 Target Resolution Date: 10/31/2022 Goal Status: Unmet Unmet Reason: continue care Ulcer/skin breakdown will have a volume reduction of 50% by week 8 Date Initiated: 09/30/2022 Date Inactivated: 12/09/2022 Target Resolution Date: 11/30/2022 Goal Status: Met Ulcer/skin breakdown will have a volume reduction of 80% by week 12 Date Initiated: 09/30/2022 Date Inactivated: 01/13/2023 Target Resolution Date: 12/31/2022 Goal Status: Unmet Unmet Reason: comorbidities Ulcer/skin breakdown will heal within 14 weeks Date Initiated: 09/30/2022 Target Resolution Date: 03/02/2023 VERDON, GRIMAUD ([id number]) [phone].pdf Page 4 of 9 Goal Status: Active Interventions: Assess patient/caregiver ability to obtain necessary supplies Assess patient/caregiver ability to perform ulcer/skin care regimen upon admission and as needed Assess ulceration(s) every visit Notes: Electronic Signature(s) Signed: 02/10/2023 5:31:20 PM By: [name] Entered By: Midge Aver on 02/10/2023 10:13:25 -------------------------------------------------------------------------------- Pain Assessment Details Patient Name: Date of Service: [name] 02/10/2023 12:30 PM Medical Record Number: [id number] Patient Account Number: [id number] Date of Birth/Sex: Treating RN: [birth year] (42 y.o. [name] Primary Care Kora Groom: [name] Other Clinician: Referring Lorelee Mclaurin: Treating Reeanna Acri/Extender: RO BSO [name], [name] in Treatment: 19 Active Problems Location of Pain  Severity and Description of Pain Patient Has Paino No Site Locations Pain Management and Medication Current Pain Management: Electronic Signature(s) Signed: 02/10/2023 5:31:20 PM By: [name] Entered By: Midge Aver on 02/10/2023 09:36:11 [name] ([id number]) [id number] Page 5 of 9 -------------------------------------------------------------------------------- Patient/Caregiver Education Details Patient Name: Date of Service: KEATS, CAMERINO 9/18/2024andnbsp12:30 PM Medical Record Number: [id number] Patient Account Number: [id number] Date of Birth/Gender: Treating RN: Nov 11, 1980 (42 y.o. [name] Primary Care Physician: [name] Other Clinician: Referring Physician: Treating Physician/Extender: RO BSO [name], [name] in Treatment: 19 Education Assessment Education Provided To: Patient Education Topics Provided Wound/Skin Impairment: Handouts: Caring for Your Ulcer Methods: Explain/Verbal Responses: State content correctly Electronic Signature(s) Signed: 02/10/2023 5:31:20 PM By: [name] Entered By: Midge Aver on 02/10/2023 10:13:39 -------------------------------------------------------------------------------- Wound Assessment Details Patient Name: Date of Service: [name] 02/10/2023 12:30 PM Medical Record Number: [id number] Patient Account Number: [id number] Date of Birth/Sex: Treating RN: Nov 09, 1980 (42 y.o. [name] Primary Care Joyce Heitman: [name] Other Clinician: Referring Jayne Peckenpaugh: Treating Jasminemarie Sherrard/Extender: [name], [name] [name], [name] in Treatment: 19 Wound  ALIE, MORTON ([id number]) [id number] Page 1 of 9 Visit Report for 02/10/2023 Arrival Information Details Patient Name: Date of Service: MCNEAL, MILLET 02/10/2023 12:30 PM Medical Record Number: [id number] Patient Account Number: [id number] Date of Birth/Sex: Treating RN: September 03, 1980 (42 y.o. [name] Primary Care Weslynn Ke: [name] Other Clinician: Referring Narely Nobles: Treating Andreanna Mikolajczak/Extender: RO BSO [name], [name] in Treatment: 19 Visit Information History Since Last Visit Added or deleted any medications: No Patient Arrived: Ambulatory Any new allergies or adverse reactions: No Arrival Time: 12:32 Has Dressing in Place as Prescribed: Yes Accompanied By: self Pain Present Now: No Transfer Assistance: None Patient Requires Transmission-Based Precautions: No Patient Has Alerts: Yes Patient Alerts: Left ABI 1.03 05/13/22 Right ABI .78 05/13/22 Electronic Signature(s) Signed: 02/10/2023 5:31:20 PM By: [name] Entered By: Midge Aver on 02/10/2023 09:35:15 -------------------------------------------------------------------------------- Encounter Discharge Information Details Patient Name: Date of Service: [name] 02/10/2023 12:30 PM Medical Record Number: [id number] Patient Account Number: [id number] Date of Birth/Sex: Treating RN: 1980/07/09 (42 y.o. [name] Primary Care Jamare Vanatta: [name] Other Clinician: Referring Teruo Stilley: Treating Zafar Debrosse/Extender: [name], [name] in Treatment: 19 Encounter Discharge Information Items Post Procedure Vitals Discharge Condition: Stable Temperature (F): 98.1 Ambulatory Status: Ambulatory Pulse (bpm): 98 Discharge Destination: Home Respiratory Rate (breaths/min): 18 Transportation: Private Auto Blood Pressure (mmHg): 134/80 Accompanied By: self Schedule Follow-up Appointment:  Yes Clinical Summary of Care: Electronic Signature(s) Signed: 02/10/2023 5:31:20 PM By: [name] Entered By: Midge Aver on 02/10/2023 10:14:50 [name] ([id number]) [id number] Page 2 of 9 -------------------------------------------------------------------------------- Lower Extremity Assessment Details Patient Name: Date of Service: ALTAY, JACKETT 02/10/2023 12:30 PM Medical Record Number: [id number] Patient Account Number: [id number] Date of Birth/Sex: Treating RN: 11-15-1980 (42 y.o. [name] Primary Care Kassondra Geil: [name] Other Clinician: Referring Louden Houseworth: Treating Adilson Grafton/Extender: RO BSO [name], [name] in Treatment: 19 Electronic Signature(s) Signed: 02/10/2023 5:31:20 PM By: [name] Entered By: Midge Aver on 02/10/2023 09:49:32 -------------------------------------------------------------------------------- Multi Wound Chart Details Patient Name: Date of Service: [name] 02/10/2023 12:30 PM Medical Record Number: [id number] Patient Account Number: [id number] Date of Birth/Sex: Treating RN: 05-Jul-1980 (42 y.o. 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, Lateral Metatarsal head fifth Wound Location: Shear/Friction Pressure Injury Blister Wounding Event: Diabetic Wound/Ulcer of the Lower Diabetic Wound/Ulcer of the Lower Diabetic Wound/Ulcer of the Lower Primary Etiology: Extremity Extremity Extremity Hypertension, Peripheral Venous Hypertension, Peripheral  Venous Hypertension, Peripheral Venous Comorbid History: Disease, Type II Diabetes, Disease, Type II Diabetes, Disease, Type II Diabetes, Osteomyelitis Osteomyelitis Osteomyelitis KAIROS, BUNTING C ([id number]) [id number] Page 3 of 9 11/18/2022 12/27/2022 10/07/2022 Date Acquired: 12 2 16  Weeks of Treatment: Open Open Open Wound Status: No No No Wound Recurrence: 0.3x0.2x0.3 1x0.2x0.7 0x0.2x0.5 Measurements L x W x D (cm) 0.047 0.157 0.016 A (cm) : rea 0.014 0.11 0.008 Volume (cm) : 85.00% -98.70% 99.60% % Reduction in A rea: 85.10% -254.80% 99.10% % Reduction in Volume: Grade 1 Grade 2 Grade 3 Classification: Medium Medium Medium Exudate A mount: Serosanguineous Serosanguineous Serosanguineous Exudate Type: red, brown red, brown red,

## 2023-02-12 ENCOUNTER — Encounter: Payer: 59 | Admitting: Physician Assistant

## 2023-02-12 DIAGNOSIS — M86672 Other chronic osteomyelitis, left ankle and foot: Secondary | ICD-10-CM | POA: Diagnosis not present

## 2023-02-12 DIAGNOSIS — E08621 Diabetes mellitus due to underlying condition with foot ulcer: Secondary | ICD-10-CM | POA: Diagnosis not present

## 2023-02-12 DIAGNOSIS — I10 Essential (primary) hypertension: Secondary | ICD-10-CM | POA: Diagnosis not present

## 2023-02-12 DIAGNOSIS — L97522 Non-pressure chronic ulcer of other part of left foot with fat layer exposed: Secondary | ICD-10-CM | POA: Diagnosis not present

## 2023-02-12 LAB — GLUCOSE, CAPILLARY
Glucose-Capillary: 284 mg/dL — ABNORMAL HIGH (ref 70–99)
Glucose-Capillary: 290 mg/dL — ABNORMAL HIGH (ref 70–99)

## 2023-02-12 NOTE — Progress Notes (Signed)
KIP, CALLWOOD ([id number]) [id number] Page 1 of 2 Visit Report for 02/11/2023 HBO Details Patient Name: Date of Service: [name], [name] 02/11/2023 10:00 A M Medical Record Number: [id number] Patient Account Number: [id number] Date of Birth/Sex: Treating RN: [birth year] (42 y.o. 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 HBO Treatment Details Treatment Number: 29 Patient Type: Outpatient Chamber Type: Monoplace Chamber Serial #: [id number] Treatment Protocol: 2.0 ATA with 90 minutes oxygen, with two 5 minute air breaks Treatment Details Compression Rate Down: 2.0 psi / minute De-Compression Rate Up: 1.5 psi / minute A breaks and breathing ir Compress Tx Pressure periods Decompress Decompress Begins Reached (leave unused spaces Begins Ends blank) Chamber Pressure (ATA 1 2 2 2 2 2  --2 1 ) Clock Time (24 hr) 10:03 10:14 10:44 10:49 11:20 11:25 - - 11:55 12:05 Treatment Length: 122 (minutes) Treatment Segments: 4 Vital Signs Capillary Blood Glucose Reference Range: 80 - 120 mg / dl HBO Diabetic Blood Glucose Intervention Range: <131 mg/dl or >253 mg/dl Time Vitals Blood Respiratory Capillary Blood Glucose Pulse Action Type: Pulse: Temperature: Taken: Pressure: Rate: Glucose (mg/dl): Meter #: Oximetry (%) Taken: Pre 10:00 134/78 105 16 98 234 1 99 Post 12:05 128/80 100 16 98 244 1 Treatment Response Treatment Toleration: Well Treatment Completion Status: Treatment Completed without Adverse Event Electronic Signature(s) Signed: 02/11/2023 4:46:13 PM By: Demetria Pore Signed: 02/12/2023 9:00:01 AM By:  Allen Derry PA-C Entered By: Demetria Pore on 02/11/2023 13:45:21 [name] ([id number]) [id number] Page 2 of 2 -------------------------------------------------------------------------------- HBO Safety Checklist Details Patient Name: Date of Service: [name], [name] 02/11/2023 10:00 A M Medical Record Number: [id number] Patient Account Number: [id number] Date of Birth/Sex: Treating RN: 01-31-1981 (42 y.o. M) Primary Care Roxi Hlavaty: [name] Other Clinician: Referring Danna Casella: Treating Kayren Holck/Extender: Greta Doom in Treatment: 19 HBO Safety Checklist Items Safety Checklist Consent Form Signed Patient voided / foley secured and emptied When did you last eato 02/11/23 Last dose of injectable or oral agent 02/11/23 Ostomy pouch emptied and vented if applicable NA All implantable devices assessed, documented and approved NA Intravenous access site secured and place NA Valuables secured Linens and cotton and cotton/polyester blend (less than 51% polyester) Personal oil-based products / skin lotions / body lotions removed Wigs or hairpieces removed NA Smoking or tobacco materials removed NA Books / newspapers / magazines / loose paper removed Cologne, aftershave, perfume and deodorant removed Jewelry removed (may wrap wedding band) Make-up removed NA Hair care products removed Battery operated devices (external) removed NA Heating patches and chemical warmers removed NA Titanium eyewear removed NA Nail polish cured greater than 10 hours NA Casting material cured greater than 10 hours NA Hearing aids removed NA Loose dentures or partials removed NA Prosthetics have been removed NA Patient demonstrates correct use of air break device (if applicable) Patient concerns have been addressed Patient grounding bracelet on and cord attached to chamber Specifics for Inpatients (complete in addition to  above) Medication sheet sent with patient NA Intravenous medications needed or due during therapy sent with patient NA Drainage tubes (e.g. nasogastric tube or chest tube secured and vented) NA Endotracheal or Tracheotomy tube secured NA Cuff deflated of air and inflated with saline NA Airway suctioned NA Electronic Signature(s) Signed:  02/11/2023 4:46:13 PM By: Demetria Pore Entered By: Demetria Pore on 02/11/2023 13:45:14

## 2023-02-12 NOTE — Progress Notes (Signed)
[name], [name] ([id number]) [id number] Page 1 of 2 Visit Report for 02/12/2023 HBO Details Patient Name: Date of Service: [name], [name] 02/12/2023 10:00 A M Medical Record Number: [id number] Patient Account Number: [id number] Date of Birth/Sex: Treating RN: 30-Jan-1981 (42 y.o. 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 HBO Treatment Details Treatment Number: 30 Patient Type: Outpatient Chamber Type: Monoplace Chamber Serial #: [id number] Treatment Protocol: 2.0 ATA with 90 minutes oxygen, with two 5 minute air breaks Treatment Details Compression Rate Down: 2.0 psi / minute De-Compression Rate Up: 1.5 psi / minute A breaks and breathing ir Compress Tx Pressure periods Decompress Decompress Begins Reached (leave unused spaces Begins Ends blank) Chamber Pressure (ATA 1 2 2 2 2 2  --2 1 ) Clock Time (24 hr) 9:59 10:09 10:40 10:45 11:16 11:21 - - 11:51 12:01 Treatment Length: 122 (minutes) Treatment Segments: 4 Vital Signs Capillary Blood Glucose Reference Range: 80 - 120 mg / dl HBO Diabetic Blood Glucose Intervention Range: <131 mg/dl or >132 mg/dl Time Vitals Blood Respiratory Capillary Blood Glucose Pulse Action Type: Pulse: Temperature: Taken: Pressure: Rate: Glucose (mg/dl): Meter #: Oximetry (%) Taken: Pre 10:00 134/80 87 16 98 284 99 Post 12:01 120/80 90 16 97.8 290 1 Treatment Response Treatment Toleration: Well Treatment Completion Status: Treatment Completed without Adverse Event Electronic Signature(s) Signed: 02/12/2023 12:08:44 PM By: Demetria Pore Signed: 02/12/2023 12:35:12 PM By:  Allen Derry PA-C Entered By: Demetria Pore on 02/12/2023 09:07:54 [name] ([id number]) [id number] Page 2 of 2 -------------------------------------------------------------------------------- HBO Safety Checklist Details Patient Name: Date of Service: [name], [name] 02/12/2023 10:00 A M Medical Record Number: [id number] Patient Account Number: [id number] Date of Birth/Sex: Treating RN: 1981/04/21 (42 y.o. M) Primary Care Nalini Alcaraz: [name] Other Clinician: Referring Dory Verdun: Treating Quinteria Chisum/Extender: Greta Doom in Treatment: 19 HBO Safety Checklist Items Safety Checklist Consent Form Signed Patient voided / foley secured and emptied When did you last eato 02/12/23 Last dose of injectable or oral agent 02/12/23 Ostomy pouch emptied and vented if applicable NA All implantable devices assessed, documented and approved NA Intravenous access site secured and place NA Valuables secured Linens and cotton and cotton/polyester blend (less than 51% polyester) Personal oil-based products / skin lotions / body lotions removed Wigs or hairpieces removed NA Smoking or tobacco materials removed NA Books / newspapers / magazines / loose paper removed Cologne, aftershave, perfume and deodorant removed Jewelry removed (may wrap wedding band) Make-up removed NA Hair care products removed Battery operated devices (external) removed NA Heating patches and chemical warmers removed NA Titanium eyewear removed NA Nail polish cured greater than 10 hours NA Casting material cured greater than 10 hours NA Hearing aids removed NA Loose dentures or partials removed NA Prosthetics have been removed NA Patient demonstrates correct use of air break device (if applicable) Patient concerns have been addressed Patient grounding bracelet on and cord attached to chamber Specifics for Inpatients (complete in addition to  above) Medication sheet sent with patient NA Intravenous medications needed or due during therapy sent with patient NA Drainage tubes (e.g. nasogastric tube or chest tube secured and vented) NA Endotracheal or Tracheotomy tube secured NA Cuff deflated of air and inflated with saline NA Airway suctioned NA Electronic Signature(s) Signed: 02/12/2023  12:08:44 PM By: Demetria Pore Entered By: Demetria Pore on 02/12/2023 08:52:21

## 2023-02-12 NOTE — Progress Notes (Signed)
CHRISTOFFER, NOVAKOVIC ([id number]) [id number] Page 1 of 2 Visit Report for 02/12/2023 Arrival Information Details Patient Name: Date of Service: [name], [name] 02/12/2023 10:00 A M Medical Record Number: [id number] Patient Account Number: [id number] Date of Birth/Sex: Treating RN: 1981/02/03 (42 y.o. M) Primary Care Stachia Slutsky: [name] Other Clinician: Referring Nolen Lindamood: Treating Disaya Walt/Extender: Greta Doom in Treatment: 19 Visit Information History Since Last Visit Added or deleted any medications: No Patient Arrived: Ambulatory Any new allergies or adverse reactions: No Arrival Time: 10:00 Had a fall or experienced change in No Accompanied By: self activities of daily living that may affect Transfer Assistance: None risk of falls: Patient Identification Verified: Yes Signs or symptoms of abuse/neglect since last visito No Secondary Verification Process Completed: Yes Hospitalized since last visit: No Patient Requires Transmission-Based Precautions: No Implantable device outside of the clinic excluding No Patient Has Alerts: Yes cellular tissue based products placed in the center Patient Alerts: Left ABI 1.03 05/13/22 since last visit: Right ABI .78 05/13/22 Pain Present Now: No Electronic Signature(s) Signed: 02/12/2023 12:08:44 PM By: Demetria Pore Entered By: Demetria Pore on 02/12/2023 08:52:16 -------------------------------------------------------------------------------- Encounter Discharge Information Details Patient Name: Date of Service: [name] 02/12/2023 10:00 A M Medical Record Number: [id number] Patient Account Number: [id number] Date of Birth/Sex: Treating RN: [birth year] (42 y.o. M) Primary Care Nakeisha Greenhouse: [name] Other Clinician: Referring Legion Discher: Treating Martrell Eguia/Extender: Greta Doom in Treatment: 19 Encounter Discharge Information Items Discharge  Condition: Stable Ambulatory Status: Ambulatory Discharge Destination: Home Transportation: Private Auto Accompanied By: self Schedule Follow-up Appointment: Yes Clinical Summary of Care: [name], [name] ([id number]) [id number] Page 2 of 2 Electronic Signature(s) Signed: 02/12/2023 12:08:44 PM By: Demetria Pore Entered By: Demetria Pore on 02/12/2023 09:08:23 -------------------------------------------------------------------------------- Vitals Details Patient Name: Date of Service: [name] 02/12/2023 10:00 A M Medical Record Number: [id number] Patient Account Number: [id number] Date of Birth/Sex: Treating RN: [birth year] (42 y.o. M) Primary Care Larz Mark: [name] Other Clinician: Referring Dashiell Franchino: Treating Arnulfo Batson/Extender: Greta Doom in Treatment: 19 Vital Signs Time Taken: 10:00 Temperature (F): 98.0 Height (in): 73 Pulse (bpm): 87 Weight (lbs): 330 Respiratory Rate (breaths/min): 16 Body Mass Index (BMI): 43.5 Blood Pressure (mmHg): 134/80 Capillary Blood Glucose (mg/dl): 557 Reference Range: 80 - 120 mg / dl Airway Pulse Oximetry (%): 99 Electronic Signature(s) Signed: 02/12/2023 12:08:44 PM By: Demetria Pore Entered By: Demetria Pore on 02/12/2023 08:52:18

## 2023-02-15 ENCOUNTER — Encounter: Payer: 59 | Admitting: Physician Assistant

## 2023-02-15 DIAGNOSIS — M86672 Other chronic osteomyelitis, left ankle and foot: Secondary | ICD-10-CM | POA: Diagnosis not present

## 2023-02-15 DIAGNOSIS — L97522 Non-pressure chronic ulcer of other part of left foot with fat layer exposed: Secondary | ICD-10-CM | POA: Diagnosis not present

## 2023-02-15 DIAGNOSIS — I10 Essential (primary) hypertension: Secondary | ICD-10-CM | POA: Diagnosis not present

## 2023-02-15 DIAGNOSIS — E08621 Diabetes mellitus due to underlying condition with foot ulcer: Secondary | ICD-10-CM | POA: Diagnosis not present

## 2023-02-15 LAB — GLUCOSE, CAPILLARY
Glucose-Capillary: 307 mg/dL — ABNORMAL HIGH (ref 70–99)
Glucose-Capillary: 324 mg/dL — ABNORMAL HIGH (ref 70–99)

## 2023-02-15 NOTE — Progress Notes (Signed)
[name], [name] ([id number]) [id number] Page 1 of 2 Visit Report for 02/15/2023 HBO Details Patient Name: Date of Service: [name], [name] 02/15/2023 10:00 A M Medical Record Number: [id number] Patient Account Number: [id number] Date of Birth/Sex: Treating RN: 10/08/1980 (42 y.o. 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 HBO Treatment Details Treatment Number: 31 Patient Type: Outpatient Chamber Type: Monoplace Chamber Serial #: [id number] Treatment Protocol: 2.0 ATA with 90 minutes oxygen, with two 5 minute air breaks Treatment Details Compression Rate Down: 2.0 psi / minute De-Compression Rate Up: 1.5 psi / minute A breaks and breathing ir Compress Tx Pressure periods Decompress Decompress Begins Reached (leave unused spaces Begins Ends blank) Chamber Pressure (ATA 1 2 2 2 2 2  --2 1 ) Clock Time (24 hr) 10:04 10:10 10:47 10:51 11:21 11:26 - - 11:57 12:06 Treatment Length: 122 (minutes) Treatment Segments: 4 Vital Signs Capillary Blood Glucose Reference Range: 80 - 120 mg / dl HBO Diabetic Blood Glucose Intervention Range: <131 mg/dl or >644 mg/dl Time Vitals Blood Respiratory Capillary Blood Glucose Pulse Action Type: Pulse: Temperature: Taken: Pressure: Rate: Glucose (mg/dl): Meter #: Oximetry (%) Taken: Pre 10:00 122/80 110 16 98 307 99 Post 12:06 120/80 98 16 97.8 324 1 99 Treatment Response Treatment Toleration: Well Treatment Completion Status: Treatment Completed without Adverse Event Electronic Signature(s) Signed: 02/15/2023 3:47:00 PM By: Demetria Pore Signed: 02/15/2023 4:57:39 PM  By: Allen Derry PA-C Entered By: Demetria Pore on 02/15/2023 12:45:47 [name] ([id number]) [id number] Page 2 of 2 -------------------------------------------------------------------------------- HBO Safety Checklist Details Patient Name: Date of Service: [name], [name] 02/15/2023 10:00 A M Medical Record Number: [id number] Patient Account Number: [id number] Date of Birth/Sex: Treating RN: [birth year] (42 y.o. M) Primary Care Tabrina Esty: [name] Other Clinician: Referring Lamiah Marmol: Treating Ismaeel Arvelo/Extender: Greta Doom in Treatment: 19 HBO Safety Checklist Items Safety Checklist Consent Form Signed Patient voided / foley secured and emptied When did you last eato 02/15/23 Last dose of injectable or oral agent 02/15/23 Ostomy pouch emptied and vented if applicable NA All implantable devices assessed, documented and approved NA Intravenous access site secured and place NA Valuables secured Linens and cotton and cotton/polyester blend (less than 51% polyester) Personal oil-based products / skin lotions / body lotions removed Wigs or hairpieces removed NA Smoking or tobacco materials removed NA Books / newspapers / magazines / loose paper removed Cologne, aftershave, perfume and deodorant removed Jewelry removed (may wrap wedding band) Make-up removed NA Hair care products removed Battery operated devices (external) removed NA Heating patches and chemical warmers removed NA Titanium eyewear removed NA Nail polish cured greater than 10 hours NA Casting material cured greater than 10 hours NA Hearing aids removed NA Loose dentures or partials removed NA Prosthetics have been removed NA Patient demonstrates correct use of air break device (if applicable) Patient concerns have been addressed Patient grounding bracelet on and cord attached to chamber Specifics for Inpatients (complete in addition to  above) Medication sheet sent with patient NA Intravenous medications needed or due during therapy sent with patient NA Drainage tubes (e.g. nasogastric tube or chest tube secured and vented) NA Endotracheal or Tracheotomy tube secured NA Cuff deflated of air and inflated with saline NA Airway suctioned NA Electronic Signature(s) Signed:  02/15/2023 3:47:00 PM By: Demetria Pore Entered By: Demetria Pore on 02/15/2023 12:44:49

## 2023-02-15 NOTE — Progress Notes (Signed)
MATTHIEU, JAGGERS ([id number]) [id number] Page 1 of 2 Visit Report for 02/15/2023 Arrival Information Details Patient Name: Date of Service: [name], [name] 02/15/2023 10:00 A M Medical Record Number: [id number] Patient Account Number: [id number] Date of Birth/Sex: Treating RN: 05-27-1980 (42 y.o. M) Primary Care Liliyana Thobe: [name] Other Clinician: Referring Talana Slatten: Treating Romond Pipkins/Extender: Greta Doom in Treatment: 19 Visit Information History Since Last Visit Added or deleted any medications: No Patient Arrived: Ambulatory Any new allergies or adverse reactions: No Arrival Time: 10:00 Had a fall or experienced change in No Accompanied By: self activities of daily living that may affect Transfer Assistance: None risk of falls: Patient Requires Transmission-Based Precautions: No Signs or symptoms of abuse/neglect since last visito No Patient Has Alerts: Yes Hospitalized since last visit: No Patient Alerts: Left ABI 1.03 05/13/22 Pain Present Now: No Right ABI .78 05/13/22 Electronic Signature(s) Signed: 02/15/2023 3:47:00 PM By: Demetria Pore Entered By: Demetria Pore on 02/15/2023 12:44:42 -------------------------------------------------------------------------------- Encounter Discharge Information Details Patient Name: Date of Service: [name] 02/15/2023 10:00 A M Medical Record Number: [id number] Patient Account Number: [id number] Date of Birth/Sex: Treating RN: [birth year] (42 y.o. M) Primary Care Syleena Mchan: [name] Other Clinician: Referring Johnney Scarlata: Treating Shalaya Swailes/Extender: Greta Doom in Treatment: 19 Encounter Discharge Information Items Discharge Condition: Stable Ambulatory Status: Ambulatory Discharge Destination: Home Transportation: Private Auto Accompanied By: self Schedule Follow-up Appointment: Yes Clinical Summary of Care: Electronic  Signature(s) Signed: 02/15/2023 3:47:00 PM By: Clint Bolder, Sherian Rein ([id number]) [id number] Page 2 of 2 Entered By: Demetria Pore on 02/15/2023 12:46:33 -------------------------------------------------------------------------------- Vitals Details Patient Name: Date of Service: [name], [name] 02/15/2023 10:00 A M Medical Record Number: [id number] Patient Account Number: [id number] Date of Birth/Sex: Treating RN: 1980/10/26 (42 y.o. M) Primary Care Raydin Bielinski: [name] Other Clinician: Referring Abraham Entwistle: Treating Addison Freimuth/Extender: Greta Doom in Treatment: 19 Vital Signs Time Taken: 10:00 Temperature (F): 98.0 Height (in): 73 Pulse (bpm): 110 Weight (lbs): 330 Respiratory Rate (breaths/min): 16 Body Mass Index (BMI): 43.5 Blood Pressure (mmHg): 122/80 Capillary Blood Glucose (mg/dl): 244 Reference Range: 80 - 120 mg / dl Airway Pulse Oximetry (%): 99 Electronic Signature(s) Signed: 02/15/2023 3:47:00 PM By: Demetria Pore Entered By: Demetria Pore on 02/15/2023 12:44:45

## 2023-02-16 ENCOUNTER — Encounter: Payer: 59 | Admitting: Physician Assistant

## 2023-02-16 DIAGNOSIS — M86672 Other chronic osteomyelitis, left ankle and foot: Secondary | ICD-10-CM | POA: Diagnosis not present

## 2023-02-16 DIAGNOSIS — E08621 Diabetes mellitus due to underlying condition with foot ulcer: Secondary | ICD-10-CM | POA: Diagnosis not present

## 2023-02-16 DIAGNOSIS — I10 Essential (primary) hypertension: Secondary | ICD-10-CM | POA: Diagnosis not present

## 2023-02-16 DIAGNOSIS — L97522 Non-pressure chronic ulcer of other part of left foot with fat layer exposed: Secondary | ICD-10-CM | POA: Diagnosis not present

## 2023-02-16 LAB — GLUCOSE, CAPILLARY
Glucose-Capillary: 286 mg/dL — ABNORMAL HIGH (ref 70–99)
Glucose-Capillary: 266 mg/dL — ABNORMAL HIGH (ref 70–99)

## 2023-02-16 NOTE — Progress Notes (Signed)
[name], [name] ([id number]) [id number] Page 1 of 2 Visit Report for 02/16/2023 Arrival Information Details Patient Name: Date of Service: [name], [name] 02/16/2023 10:00 A M Medical Record Number: [id number] Patient Account Number: [id number] Date of Birth/Sex: Treating RN: [birth year] (42 y.o. M) Primary Care [name]: [name] Other Clinician: Referring [name]: Treating [name]/Extender: [name] in Treatment: 19 Visit Information History Since Last Visit Added or deleted any medications: No Patient Arrived: Ambulatory Any new allergies or adverse reactions: No Arrival Time: 10:00 Had a fall or experienced change in No Accompanied By: self activities of daily living that may affect Transfer Assistance: None risk of falls: Patient Identification Verified: Yes Signs or symptoms of abuse/neglect since last visito No Secondary Verification Process Completed: Yes Hospitalized since last visit: No Patient Requires Transmission-Based Precautions: No Implantable device outside of the clinic excluding No Patient Has Alerts: Yes cellular tissue based products placed in the center Patient Alerts: Left ABI 1.03 05/13/22 since last visit: Right ABI .78 05/13/22 Pain Present Now: No Electronic Signature(s) Signed: 02/16/2023 2:36:05 PM By: [name] Entered By: [name] on 02/16/2023 14:31:23 -------------------------------------------------------------------------------- Encounter Discharge Information Details Patient Name: Date of Service: [name] 02/16/2023 10:00 A M Medical Record Number: [id number] Patient Account Number: [id number] Date of Birth/Sex: Treating RN: 06/05/1980 (42 y.o. M) Primary Care [name]: [name] Other Clinician: Referring [name]: Treating [name]/Extender: [name] in Treatment: 19 Encounter Discharge Information Items Discharge  Condition: Stable Ambulatory Status: Ambulatory Discharge Destination: Home Transportation: Private Auto Accompanied By: self Schedule Follow-up Appointment: Yes Clinical Summary of Care: [name], [name] ([id number]) [id number] Page 2 of 2 Electronic Signature(s) Signed: 02/16/2023 2:36:05 PM By: [name] Entered By: [name] on 02/16/2023 14:35:44 -------------------------------------------------------------------------------- Vitals Details Patient Name: Date of Service: [name] 02/16/2023 10:00 A M Medical Record Number: [id number] Patient Account Number: [id number] Date of Birth/Sex: Treating RN: 10/14/1980 (42 y.o. M) Primary Care [name]: [name] Other Clinician: Referring [name]: Treating [name]/Extender: [name] in Treatment: 19 Vital Signs Time Taken: 10:00 Temperature (F): 98.0 Height (in): 73 Pulse (bpm): 100 Weight (lbs): 330 Respiratory Rate (breaths/min): 16 Body Mass Index (BMI): 43.5 Blood Pressure (mmHg): 124/80 Capillary Blood Glucose (mg/dl): 253 Reference Range: 80 - 120 mg / dl Airway Pulse Oximetry (%): 99 Electronic Signature(s) Signed: 02/16/2023 2:36:05 PM By: [name] Entered By: [name] on 02/16/2023 14:31:25

## 2023-02-17 ENCOUNTER — Other Ambulatory Visit (HOSPITAL_COMMUNITY): Payer: Self-pay

## 2023-02-17 ENCOUNTER — Encounter: Payer: 59 | Admitting: Internal Medicine

## 2023-02-17 DIAGNOSIS — L97522 Non-pressure chronic ulcer of other part of left foot with fat layer exposed: Secondary | ICD-10-CM | POA: Diagnosis not present

## 2023-02-17 DIAGNOSIS — M86672 Other chronic osteomyelitis, left ankle and foot: Secondary | ICD-10-CM | POA: Diagnosis not present

## 2023-02-17 DIAGNOSIS — E08621 Diabetes mellitus due to underlying condition with foot ulcer: Secondary | ICD-10-CM | POA: Diagnosis not present

## 2023-02-17 DIAGNOSIS — I10 Essential (primary) hypertension: Secondary | ICD-10-CM | POA: Diagnosis not present

## 2023-02-17 DIAGNOSIS — E11621 Type 2 diabetes mellitus with foot ulcer: Secondary | ICD-10-CM | POA: Diagnosis not present

## 2023-02-17 DIAGNOSIS — L97512 Non-pressure chronic ulcer of other part of right foot with fat layer exposed: Secondary | ICD-10-CM | POA: Diagnosis not present

## 2023-02-17 LAB — GLUCOSE, CAPILLARY
Glucose-Capillary: 248 mg/dL — ABNORMAL HIGH (ref 70–99)
Glucose-Capillary: 217 mg/dL — ABNORMAL HIGH (ref 70–99)

## 2023-02-17 MED ORDER — DOXYCYCLINE MONOHYDRATE 100 MG PO CAPS
100.0000 mg | ORAL_CAPSULE | Freq: Two times a day (BID) | ORAL | 0 refills | Status: DC
  Filled 2023-02-17: qty 14, 7d supply, fill #0

## 2023-02-17 NOTE — Progress Notes (Signed)
DMOREA, SPARGER ([id number]) [id number] Page 1 of 2 Visit Report for 02/17/2023 Arrival Information Details Patient Name: Date of Service: [name], [name] 02/17/2023 10:00 A M Medical Record Number: [id number] Patient Account Number: [id number] Date of Birth/Sex: Treating RN: 12-31-1980 (42 y.o. M) Primary Care Zoriana Oats: [name] Other Clinician: Demetria Pore Referring Kamber Vignola: Treating Melissia Lahman/Extender: Chauncey Mann, [name] [name], Charlett Blake in Treatment: 20 Visit Information History Since Last Visit Added or deleted any medications: No Patient Arrived: Ambulatory Any new allergies or adverse reactions: No Arrival Time: 10:00 Had a fall or experienced change in No Accompanied By: self activities of daily living that may affect Transfer Assistance: None risk of falls: Patient Identification Verified: Yes Signs or symptoms of abuse/neglect since last visito No Secondary Verification Process Completed: Yes Hospitalized since last visit: No Patient Requires Transmission-Based Precautions: No Implantable device outside of the clinic excluding No Patient Has Alerts: Yes cellular tissue based products placed in the center Patient Alerts: Left ABI 1.03 05/13/22 since last visit: Right ABI .78 05/13/22 Pain Present Now: No Electronic Signature(s) Signed: 02/17/2023 2:49:52 PM By: Demetria Pore Entered By: Demetria Pore on 02/17/2023 14:43:33 -------------------------------------------------------------------------------- Encounter Discharge Information Details Patient Name: Date of Service: [name] 02/17/2023 10:00 A M Medical Record Number: [id number] Patient Account Number: [id number] Date of Birth/Sex: Treating RN: [birth year] (42 y.o. M) Primary Care Adin Lariccia: [name] Other Clinician: Demetria Pore Referring Danner Paulding: Treating Corene Resnick/Extender: RO BSO [name], [name] in Treatment:  20 Encounter Discharge Information Items Discharge Condition: Stable Ambulatory Status: Ambulatory Discharge Destination: Home Transportation: Private Auto Schedule Follow-up Appointment: Yes Clinical Summary of Care: Electronic Signature(s) [name], [name] [id number] Page 2 of 2 Signed: 02/17/2023 2:49:52 PM By: Demetria Pore Entered By: Demetria Pore on 02/17/2023 14:48:32 -------------------------------------------------------------------------------- Vitals Details Patient Name: Date of Service: [name] 02/17/2023 10:00 A M Medical Record Number: [id number] Patient Account Number: [id number] Date of Birth/Sex: Treating RN: [birth year] (42 y.o. M) Primary Care Delando Satter: [name] Other Clinician: Demetria Pore Referring Tashayla Therien: Treating Makhi Muzquiz/Extender: Chauncey Mann, [name] [name], [name] in Treatment: 20 Vital Signs Time Taken: 10:00 Temperature (F): 98.0 Height (in): 73 Pulse (bpm): 99 Weight (lbs): 330 Respiratory Rate (breaths/min): 16 Body Mass Index (BMI): 43.5 Blood Pressure (mmHg): 130/80 Capillary Blood Glucose (mg/dl): 244 Reference Range: 80 - 120 mg / dl Airway Pulse Oximetry (%): 99 Electronic Signature(s) Signed: 02/17/2023 2:49:52 PM By: Demetria Pore Entered By: Demetria Pore on 02/17/2023 14:44:08

## 2023-02-17 NOTE — Progress Notes (Signed)
[name], [name] ([id number]) [id number] Page 1 of 2 Visit Report for 02/17/2023 HBO Details Patient Name: Date of Service: [name], [name] 02/17/2023 10:00 A M Medical Record Number: [id number] Patient Account Number: [id number] Date of Birth/Sex: Treating RN: 18-Nov-1980 (42 y.o. 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 HBO Treatment Details Treatment Number: 33 Patient Type: Outpatient Chamber Type: Monoplace Chamber Serial #: [id number] Treatment Protocol: 2.0 ATA with 90 minutes oxygen, with two 5 minute air breaks Treatment Details Compression Rate Down: 2.0 psi / minute De-Compression Rate Up: 1.5 psi / minute A breaks and breathing ir Compress Tx Pressure periods Decompress Decompress Begins Reached (leave unused spaces Begins Ends blank) Chamber Pressure (ATA 1 2 2 2 2 2  --2 1 ) Clock Time (24 hr) 10:09 10:16 10:49 10:54 11:24 11:29 - - 11:59 12:10 Treatment Length: 121 (minutes) Treatment Segments: 4 Vital Signs Capillary Blood Glucose Reference Range: 80 - 120 mg / dl HBO Diabetic Blood Glucose Intervention Range: <131 mg/dl or >413 mg/dl Time Vitals Blood Respiratory Capillary Blood Glucose Pulse Action Type: Pulse: Temperature: Taken: Pressure: Rate: Glucose (mg/dl): Meter #: Oximetry (%) Taken: Pre 10:00 130/80 99 16 98 248 1 99 non per protocol Post 12:10 138/80 96 16 97.8 217 1 99 non per protocol Treatment Response Treatment Toleration: Well Treatment Completion Status: Treatment Completed without Adverse Event [name] Notes No concerns with treatment  given. Patient was also seen for wound care evaluation HBO Attestation I certify that I supervised this HBO treatment in accordance with Medicare guidelines. A trained emergency response team is readily available per Yes hospital policies and procedures. Continue HBOT as ordered. Yes Electronic Signature(s) Signed: 02/17/2023 4:28:36 PM By: [name] Najjar MD Previous Signature: 02/17/2023 2:49:52 PM Version By: [name] Entered By: [name] on 02/17/2023 16:27:34 [name] ([id number]) [id number] Page 2 of 2 -------------------------------------------------------------------------------- HBO Safety Checklist Details Patient Name: Date of Service: [name], [name] 02/17/2023 10:00 A M Medical Record Number: [id number] Patient Account Number: [id number] Date of Birth/Sex: Treating RN: [birth year] (42 y.o. M) Primary Care [name]: [name] Other Clinician: Demetria [name] Referring [name]: Treating [name]/Extender: [name], [name] in Treatment: 20 HBO Safety Checklist Items Safety Checklist Consent Form Signed Patient voided / foley secured and emptied When did you last eato 02/17/23 Last dose of injectable or oral agent 02/17/23 Ostomy pouch emptied and vented if applicable NA All implantable devices assessed, documented and approved NA Intravenous access site secured and place NA Valuables secured Linens and cotton and cotton/polyester blend (less than 51% polyester) Personal oil-based products / skin lotions / body lotions removed Wigs or hairpieces removed NA Smoking or tobacco materials removed NA Books / newspapers / magazines / loose paper removed Cologne, aftershave, perfume and deodorant removed Jewelry removed (may wrap wedding band) Make-up removed NA Hair care products removed Battery operated devices (external) removed NA Heating patches and chemical warmers removed NA Titanium  eyewear removed NA Nail polish cured greater than 10 hours NA Casting material cured greater than 10 hours NA Hearing aids removed NA Loose dentures or partials removed NA Prosthetics have been removed NA Patient demonstrates correct use of air break device (if applicable) Patient concerns  have been addressed Patient grounding bracelet on and cord attached to chamber Specifics for Inpatients (complete in addition to above) Medication sheet sent with patient NA Intravenous medications needed or due during therapy sent with patient NA Drainage tubes (e.g. nasogastric tube or chest tube secured and vented) NA Endotracheal or Tracheotomy tube secured NA Cuff deflated of air and inflated with saline NA Airway suctioned NA Electronic Signature(s) Signed: 02/17/2023 2:49:52 PM By: [name] Entered By: [name] on 02/17/2023 14:44:14

## 2023-02-17 NOTE — Progress Notes (Signed)
[name], [name] ([id number]) [id number] Page 1 of 2 Visit Report for 02/16/2023 HBO Details Patient Name: Date of Service: [name], [name] 02/16/2023 10:00 A M Medical Record Number: [id number] Patient Account Number: [id number] Date of Birth/Sex: Treating RN: 06/13/1980 (42 y.o. 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 HBO Treatment Details Treatment Number: 32 Patient Type: Outpatient Chamber Type: Monoplace Chamber Serial #: [id number] Treatment Protocol: 2.0 ATA with 90 minutes oxygen, with two 5 minute air breaks Treatment Details Compression Rate Down: 2.0 psi / minute De-Compression Rate Up: 1.5 psi / minute A breaks and breathing ir Compress Tx Pressure periods Decompress Decompress Begins Reached (leave unused spaces Begins Ends blank) Chamber Pressure (ATA 1 2 2 2 2 2  --2 1 ) Clock Time (24 hr) 10:20 10:27 10:57 11:01 11:31 11:35 - - 12:07 12:20 Treatment Length: 120 (minutes) Treatment Segments: 4 Vital Signs Capillary Blood Glucose Reference Range: 80 - 120 mg / dl HBO Diabetic Blood Glucose Intervention Range: <131 mg/dl or >413 mg/dl Time Vitals Blood Respiratory Capillary Blood Glucose Pulse Action Type: Pulse: Temperature: Taken: Pressure: Rate: Glucose (mg/dl): Meter #: Oximetry (%) Taken: Pre 10:00 124/80 100 16 98 286 99 non per protocol Post 12:20 122/80 99 16 97.8 266 1 99 non per protocol Treatment Response Treatment Toleration: Well Treatment Completion Status: Treatment Completed without Adverse Event Electronic Signature(s) Signed: 02/16/2023 2:36:05 PM By: [name] Signed: 02/16/2023 4:57:42 PM By: [name] Derry PA-C Entered By: [name] on 02/16/2023 14:35:17 [name] ([id number]) [id number] Page 2 of 2 -------------------------------------------------------------------------------- HBO Safety Checklist Details Patient Name: Date of Service: [name], [name] 02/16/2023 10:00 A M Medical Record Number: [id number] Patient Account Number: [id number] Date of Birth/Sex: Treating RN: 01-24-1981 (42 y.o. M) Primary Care [name]: [name] Other Clinician: Referring [name]: Treating [name]/Extender: [name] in Treatment: 19 HBO Safety Checklist Items Safety Checklist Consent Form Signed Patient voided / foley secured and emptied When did you last eato 02/16/23 Last dose of injectable or oral agent 02/16/23 Ostomy pouch emptied and vented if applicable NA All implantable devices assessed, documented and approved NA Intravenous access site secured and place NA Valuables secured Linens and cotton and cotton/polyester blend (less than 51% polyester) Personal oil-based products / skin lotions / body lotions removed Wigs or hairpieces removed NA Smoking or tobacco materials removed NA Books / newspapers / magazines / loose paper removed Cologne, aftershave, perfume and deodorant removed Jewelry removed (may wrap wedding band) Make-up removed Hair care products removed Battery operated devices (external) removed NA Heating patches and chemical warmers removed NA Titanium eyewear removed NA Nail polish cured greater than 10 hours NA Casting material cured greater than 10 hours NA Hearing aids removed NA Loose dentures or partials removed NA Prosthetics have been removed NA Patient demonstrates correct use of air break device (if applicable) Patient concerns have been addressed Patient grounding bracelet on and cord attached to chamber Specifics for  Inpatients (complete in addition to above) Medication sheet sent with patient NA Intravenous medications needed or due during therapy sent with patient NA Drainage tubes (e.g. nasogastric tube or chest tube secured and vented) NA Endotracheal or Tracheotomy tube secured NA Cuff deflated of air and inflated with saline NA Airway  suctioned NA Electronic Signature(s) Signed: 02/16/2023 2:36:05 PM By: [name] Entered By: [name] on 02/16/2023 14:31:28

## 2023-02-18 ENCOUNTER — Other Ambulatory Visit: Payer: Self-pay | Admitting: Internal Medicine

## 2023-02-18 ENCOUNTER — Encounter: Payer: 59 | Admitting: Physician Assistant

## 2023-02-18 ENCOUNTER — Ambulatory Visit
Admission: RE | Admit: 2023-02-18 | Discharge: 2023-02-18 | Disposition: A | Discharge status: Home / Self Care | Payer: 59 | Attending: Internal Medicine | Admitting: Internal Medicine

## 2023-02-18 ENCOUNTER — Ambulatory Visit
Admission: RE | Admit: 2023-02-18 | Discharge: 2023-02-18 | Disposition: A | Discharge status: Home / Self Care | Payer: 59 | Source: Ambulatory Visit | Attending: Internal Medicine | Admitting: Internal Medicine

## 2023-02-18 DIAGNOSIS — L97522 Non-pressure chronic ulcer of other part of left foot with fat layer exposed: Secondary | ICD-10-CM | POA: Diagnosis not present

## 2023-02-18 DIAGNOSIS — M19071 Primary osteoarthritis, right ankle and foot: Secondary | ICD-10-CM | POA: Diagnosis not present

## 2023-02-18 DIAGNOSIS — I10 Essential (primary) hypertension: Secondary | ICD-10-CM | POA: Diagnosis not present

## 2023-02-18 DIAGNOSIS — M19072 Primary osteoarthritis, left ankle and foot: Secondary | ICD-10-CM | POA: Diagnosis not present

## 2023-02-18 DIAGNOSIS — E08621 Diabetes mellitus due to underlying condition with foot ulcer: Secondary | ICD-10-CM | POA: Diagnosis not present

## 2023-02-18 DIAGNOSIS — L97529 Non-pressure chronic ulcer of other part of left foot with unspecified severity: Secondary | ICD-10-CM | POA: Diagnosis not present

## 2023-02-18 DIAGNOSIS — M86672 Other chronic osteomyelitis, left ankle and foot: Secondary | ICD-10-CM | POA: Diagnosis not present

## 2023-02-18 DIAGNOSIS — T8789 Other complications of amputation stump: Secondary | ICD-10-CM

## 2023-02-18 DIAGNOSIS — M7989 Other specified soft tissue disorders: Secondary | ICD-10-CM | POA: Diagnosis not present

## 2023-02-18 LAB — GLUCOSE, CAPILLARY
Glucose-Capillary: 445 mg/dL — ABNORMAL HIGH (ref 70–99)
Glucose-Capillary: 352 mg/dL — ABNORMAL HIGH (ref 70–99)

## 2023-02-19 ENCOUNTER — Encounter: Payer: 59 | Admitting: Physician Assistant

## 2023-02-19 NOTE — Progress Notes (Signed)
[name], [name] ([id number]) [id number] Page 1 of 9 Visit Report for 02/17/2023 Arrival Information Details Patient Name: Date of Service: [name], [name] 02/17/2023 12:30 PM Medical Record Number: [id number] Patient Account Number: [id number] Date of Birth/Sex: Treating RN: 1981-01-05 (42 y.o. [name] Primary Care Tykesha Konicki: [name] Other Clinician: Referring Love Milbourne: Treating Sorcha Rotunno/Extender: RO BSO [name], [name] [name], Charlett Blake in Treatment: 20 Visit Information History Since Last Visit Added or deleted any medications: No Patient Arrived: Ambulatory Any new allergies or adverse reactions: No Arrival Time: 12:35 Has Dressing in Place as Prescribed: Yes Accompanied By: self Pain Present Now: No Transfer Assistance: None Patient Identification Verified: Yes Secondary Verification Process Completed: Yes Patient Requires Transmission-Based Precautions: No Patient Has Alerts: Yes Patient Alerts: Left ABI 1.03 05/13/22 Right ABI .78 05/13/22 Electronic Signature(s) Signed: 02/19/2023 12:30:42 PM By: [name] Entered By: Midge Aver on 02/17/2023 09:38:16 -------------------------------------------------------------------------------- Encounter Discharge Information Details Patient Name: Date of Service: [name] 02/17/2023 12:30 PM Medical Record Number: [id number] Patient Account Number: [id number] Date of Birth/Sex: Treating RN: [birth year] (42 y.o. [name] Primary Care Grisell Bissette: [name] Other Clinician: Referring Karia Ehresman: Treating Leanna Hamid/Extender: [name], [name] in Treatment: 20 Encounter Discharge Information Items Post Procedure Vitals Discharge Condition: Stable Temperature (F): 98.1 Ambulatory Status: Ambulatory Pulse (bpm): 96 Discharge Destination: Home Respiratory Rate (breaths/min): 16 Transportation: Private Auto Blood  Pressure (mmHg): 138/80 Accompanied By: self Schedule Follow-up Appointment: Yes Clinical Summary of Care: Electronic Signature(sKEITON, COSMA ([id number]) [id number] Page 2 of 9 Signed: 02/23/2023 5:04:13 PM By: [name] Entered By: Midge Aver on 02/23/2023 14:04:13 -------------------------------------------------------------------------------- Lower Extremity Assessment Details Patient Name: Date of Service: [name] 02/17/2023 12:30 PM Medical Record Number: [id number] Patient Account Number: [id number] Date of Birth/Sex: Treating RN: Mar 03, 1981 (42 y.o. [name] Primary Care Beyonca Wisz: [name] Other Clinician: Referring Filemon Breton: Treating Mercia Dowe/Extender: RO BSO [name], [name] in Treatment: 20 Electronic Signature(s) Signed: 02/19/2023 12:30:42 PM By: [name] Entered By: Midge Aver on 02/17/2023 09:53:04 -------------------------------------------------------------------------------- Multi Wound Chart Details Patient Name: Date of Service: [name] 02/17/2023 12:30 PM Medical Record Number: [id number] Patient Account Number: [id number] Date of Birth/Sex: Treating RN: 1981-04-14 (42 y.o. [name] Primary Care Gypsy Kellogg: [name] Other Clinician: Referring Jayven Naill: Treating Vitaliy Eisenhour/Extender: [name], [name] [name], [name] in Treatment: 20 Vital Signs Height(in): 73 Capillary Blood Glucose(mg/dl): 474 Weight(lbs): 259 Pulse(bpm): 96 Body Mass Index(BMI): 43.5 Blood Pressure(mmHg): 138/80 Temperature(F): 98.1 Respiratory Rate(breaths/min): 16 [10:Photos:] Left Amputation Site - Toe Right Amputation Site - Toe Left, Lateral Metatarsal head fifth Wound Location: Shear/Friction Pressure Injury Blister Wounding Event: Diabetic Wound/Ulcer of the Lower Diabetic Wound/Ulcer of the Lower Diabetic Wound/Ulcer of  the Lower Primary Etiology: Extremity Extremity Extremity DAVIUS, GOUDEAU ([id number]) [phone].pdf Page 3 of 9 Hypertension, Peripheral Venous Hypertension, Peripheral Venous Hypertension, Peripheral Venous Comorbid History: Disease, Type II Diabetes, Disease, Type II Diabetes, Disease, Type II Diabetes, Osteomyelitis Osteomyelitis Osteomyelitis 11/18/2022 12/27/2022 10/07/2022 Date Acquired: 13 3 17  Weeks of Treatment: Open Open Open Wound Status: No No No Wound Recurrence: 0.4x0.2x0.5 1.3x0.2x1.3 0.4x0.2x0.7 Measurements L x W x D (cm) 0.063 0.204 0.063 A (cm) : rea 0.031 0.265 0.044 Volume (cm) : 79.90% -158.20% 98.50% % Reduction in A rea: 67.00% -754.80% 94.90% % Reduction in Volume: Grade 1 Grade 2 Grade 3 Classification: Medium Medium Medium  Exudate A mount: Serosanguineous Serosanguineous Serosanguineous Exudate Type: red, brown red, brown red, brown Exudate Color: Large (67-100%) None Present (0%) N/A Granulation A mount: Red, Pink N/A N/A Granulation Quality: Small (1-33%) None Present (0%) N/A Necrotic A mount: Fat Layer (Subcutaneous Tissue): Yes Fascia: No N/A Exposed Structures: Fascia: No Fat Layer (Subcutaneous Tissue): No Tendon: No Tendon: No Muscle: No Muscle: No Joint: No Joint: No Bone: No Bone: No None None N/A Epithelialization: Treatment Notes Electronic Signature(s) Signed: 02/19/2023 12:30:42 PM By: [name] Entered By: Midge Aver on 02/17/2023 09:54:45 -------------------------------------------------------------------------------- Pain Assessment Details Patient Name: Date of Service: [name] 02/17/2023 12:30 PM Medical Record Number: [id number] Patient Account Number: [id number] Date of Birth/Sex: Treating RN: 1980-12-28 (42 y.o. [name] Primary Care Marguerette Sheller: [name] Other Clinician: Referring Kashmir Lysaght: Treating Kimberly Nieland/Extender: RO BSO [name], MICHA  EL [name] in Treatment: 20 Active Problems Location of Pain Severity and Description of Pain Patient Has Paino No Site Locations [place], [place] C ([id number]) [id number] Page 4 of 9 Pain Management and Medication Current Pain Management: Electronic Signature(s) Signed: 02/19/2023 12:30:42 PM By: [name] Entered By: Midge Aver on 02/17/2023 09:40:37 -------------------------------------------------------------------------------- Patient/Caregiver Education Details Patient Name: Date of Service: [name] 9/25/2024andnbsp12:30 PM Medical Record Number: [id number] Patient Account Number: [id number] Date of Birth/Gender: Treating RN: 04/25/1981 (42 y.o. [name] Primary Care Physician: [name] Other Clinician: Referring Physician: Treating Physician/Extender: RO BSO [name], [name] in Treatment: 20 Education Assessment Education Provided To: Patient Education Topics Provided Wound/Skin Impairment: Handouts: Caring for Your Ulcer Methods: Explain/Verbal Responses: State content correctly Electronic Signature(s) Signed: 02/23/2023 5:15:10 PM By: [name] Entered By: Midge Aver on 02/23/2023 14:02:35 -------------------------------------------------------------------------------- Wound Assessment Details Patient Name: Date of Service: [name] 02/17/2023 12:30 PM Medical Record Number: [id number] Patient Account Number: [id number] Date of Birth/Sex: Treating RN: 13-May-1981 (42 y.o. [name] Primary Care Shivaan Tierno: [name] Other Clinician: Referring Rubert Frediani: Treating Jaevian Shean/Extender: Chauncey Mann, [name] in Treatment: [address] Wound Status VITALIY, EISENHOUR ([id number]) [id number] Page 5 of 9 Wound Number: 10 Primary Diabetic Wound/Ulcer of the Lower  Extremity Etiology: Wound Location: Left Amputation Site - Toe Wound Status: Open Wounding Event: Shear/Friction Comorbid Hypertension, Peripheral Venous Disease, Type II Diabetes, Date Acquired: 11/18/2022 History: Osteomyelitis Weeks Of Treatment: 13 Clustered Wound: No Photos Wound Measurements Length: (cm) 0.4 Width: (cm) 0.2 Depth: (cm) 0.5 Area: (cm) 0.063 Volume: (cm) 0.031 % Reduction in Area: 79.9% % Reduction in Volume: 67% Epithelialization: None Wound Description Classification: Grade 1 Exudate Amount: Medium Exudate Type: Serosanguineous Exudate Color: red, brown Foul Odor After Cleansing: No Slough/Fibrino Yes Wound Bed Granulation Amount: Large (67-100%) Exposed Structure Granulation Quality: Red, Pink Fascia Exposed: No Necrotic Amount: Small (1-33%) Fat Layer (Subcutaneous Tissue) Exposed: Yes Necrotic Quality: Adherent Slough Tendon Exposed: No Muscle Exposed: No Joint Exposed: No Bone Exposed: No Treatment Notes Wound #10 (Amputation Site - Toe) Wound Laterality: Left Cleanser Soap and Water Discharge Instruction: Gently cleanse wound with antibacterial soap, rinse and pat dry prior to dressing wounds Peri-Wound Care Topical Primary Dressing Silvercel Small 2x2 (in/in) Discharge Instruction: Apply Silvercel Small 2x2 (in/in) as instructed Secondary Dressing Gauze Discharge Instruction: As directed: dry, moistened with saline or moistened with Dakins Solution Kerlix 4.5 x 4.1 (in/yd) Discharge Instruction: Apply Kerlix 4.5 x 4.1 (in/yd) as instructed Secured With Medipore T - 50M Medipore  H Soft Cloth Surgical T ape ape, 2x2 (in/yd) Compression Wrap Compression Stockings Add-Ons Electronic Signature(s) JARYAN, CHICOINE ([id number]) [id number] Page 6 of 9 Signed: 02/19/2023 12:30:42 PM By: [name] Entered By: Midge Aver on 02/17/2023  09:51:43 -------------------------------------------------------------------------------- Wound Assessment Details Patient Name: Date of Service: TRAYLEN, ECKELS 02/17/2023 12:30 PM Medical Record Number: [id number] Patient Account Number: [id number] Date of Birth/Sex: Treating RN: 07-17-1980 (42 y.o. [name] Primary Care Jag Lenz: [name] Other Clinician: Referring Lakyla Biswas: Treating Lovette Merta/Extender: [name], [name] [name], [name] in Treatment: 20 Wound Status Wound Number: 11 Primary Diabetic Wound/Ulcer of the Lower Extremity Etiology: Wound Location: Right Amputation Site - Toe Wound Status: Open Wounding Event: Pressure Injury Comorbid Hypertension, Peripheral Venous Disease, Type II Diabetes, Date Acquired: 12/27/2022 History: Osteomyelitis Weeks Of Treatment: 3 Clustered Wound: No Photos Wound Measurements Length: (cm) 1.3 Width: (cm) 0.2 Depth: (cm) 1.3 Area: (cm) 0.204 Volume: (cm) 0.265 % Reduction in Area: -158.2% % Reduction in Volume: -754.8% Epithelialization: None Wound Description Classification: Grade 2 Exudate Amount: Medium Exudate Type: Serosanguineous Exudate Color: red, brown Wound Bed Granulation Amount: None Present (0%) Exposed Structure Necrotic Amount: None Present (0%) Fascia Exposed: No Fat Layer (Subcutaneous Tissue) Exposed: No Tendon Exposed: No Muscle Exposed: No Joint Exposed: No Bone Exposed: No Treatment Notes Wound #11 (Amputation Site - Toe) Wound Laterality: Right ZAIN, LANKFORD C ([id number]) [id number] Page 7 of 9 Cleanser Soap and Water Discharge Instruction: Gently cleanse wound with antibacterial soap, rinse and pat dry prior to dressing wounds Peri-Wound Care Topical Primary Dressing Silvercel Small 2x2 (in/in) Discharge Instruction: Apply Silvercel Small 2x2 (in/in) as instructed Secondary Dressing Gauze Discharge Instruction: As directed: dry,  moistened with saline or moistened with Dakins Solution Kerlix 4.5 x 4.1 (in/yd) Discharge Instruction: Apply Kerlix 4.5 x 4.1 (in/yd) as instructed Secured With Medipore T - 18M Medipore H Soft Cloth Surgical T ape ape, 2x2 (in/yd) Compression Wrap Compression Stockings Add-Ons Electronic Signature(s) Signed: 02/19/2023 12:30:42 PM By: [name] Entered By: Midge Aver on 02/17/2023 09:52:12 -------------------------------------------------------------------------------- Wound Assessment Details Patient Name: Date of Service: [name] 02/17/2023 12:30 PM Medical Record Number: [id number] Patient Account Number: [id number] Date of Birth/Sex: Treating RN: [birth year] (42 y.o. [name] Primary Care Ashla Murph: [name] Other Clinician: Referring Eastyn Skalla: Treating Milica Gully/Extender: [name], [name] [name], [name] in Treatment: 20 Wound Status Wound Number: 9 Primary Diabetic Wound/Ulcer of the Lower Extremity Etiology: Wound Location: Left, Lateral Metatarsal head fifth Wound Status: Open Wounding Event: Blister Comorbid Hypertension, Peripheral Venous Disease, Type II Diabetes, Date Acquired: 10/07/2022 History: Osteomyelitis Weeks Of Treatment: 17 Clustered Wound: No Photos SERIGNE, KUBICEK ([id number]) [id number] Page 8 of 9 Wound Measurements Length: (cm) 0.4 Width: (cm) 0.2 Depth: (cm) 0.7 Area: (cm) 0.063 Volume: (cm) 0.044 % Reduction in Area: 98.5% % Reduction in Volume: 94.9% Wound Description Classification: Exudate Amount: Exudate Type: Exudate Color: Grade 3 Medium Serosanguineous red, brown Treatment Notes Wound #9 (Metatarsal head fifth) Wound Laterality: Left, Lateral Cleanser Soap and Water Discharge Instruction: Gently cleanse wound with antibacterial soap, rinse and pat dry prior to dressing wounds Peri-Wound Care Topical Primary Dressing Silvercel Small 2x2  (in/in) Discharge Instruction: Apply Silvercel Small 2x2 (in/in) as instructed Secondary Dressing Gauze Discharge Instruction: As directed: dry, moistened with saline or moistened with Dakins Solution Kerlix 4.5 x 4.1 (in/yd) Discharge Instruction: Apply Kerlix 4.5 x 4.1 (in/yd) as instructed Secured With Medipore T -  63M Medipore H Soft Cloth Surgical T ape ape, 2x2 (in/yd) Compression Wrap Compression Stockings Add-Ons Electronic Signature(s) Signed: 02/19/2023 12:30:42 PM By: [name] Entered By: Midge Aver on 02/17/2023 09:52:32 -------------------------------------------------------------------------------- Vitals Details Patient Name: Date of Service: [name] 02/17/2023 12:30 PM Medical Record Number: [id number] Patient Account Number: [id number] Date of Birth/Sex: Treating RN: May 03, 1981 (42 y.o. [name] Primary Care Ebony Rickel: [name] Other Clinician: Referring Mirielle Byrum: Treating Pier Laux/Extender: [name], [name] [name], [name] in Treatment: 20 Vital Signs Time Taken: 12:39 Temperature (F): 98.1 Height (in): 73 Pulse (bpm): 96 Weight (lbs): 330 Respiratory Rate (breaths/min): 16 Barthold, Lola C ([id number]) [id number] Page 9 of 9 Body Mass Index (BMI): 43.5 Blood Pressure (mmHg): 138/80 Capillary Blood Glucose (mg/dl): 425 Reference Range: 80 - 120 mg / dl Airway Pulse Oximetry (%): 99 Electronic Signature(s) Signed: 02/19/2023 12:30:42 PM By: [name] Entered By: Midge Aver on 02/17/2023 09:40:28

## 2023-02-22 ENCOUNTER — Encounter: Payer: 59 | Admitting: Physician Assistant

## 2023-02-22 NOTE — Progress Notes (Signed)
[name] ([id number]) [id number] Page 1 of 2 Visit Report for 02/18/2023 Arrival Information Details Patient Name: Date of Service: [name], [name] 02/18/2023 10:00 A M Medical Record Number: [id number] Patient Account Number: [id number] Date of Birth/Sex: Treating RN: 04-Sep-1980 (42 y.o. M) Primary Care Tali Coster: [name] Other Clinician: Referring Mellanie Bejarano: Treating Jaiceon Collister/Extender: Greta Doom in Treatment: 20 Visit Information History Since Last Visit Added or deleted any medications: No Patient Arrived: Ambulatory Any new allergies or adverse reactions: No Arrival Time: 10:00 Had a fall or experienced change in No Accompanied By: self activities of daily living that may affect Transfer Assistance: None risk of falls: Patient Requires Transmission-Based Precautions: No Signs or symptoms of abuse/neglect since last visito No Patient Has Alerts: Yes Hospitalized since last visit: No Patient Alerts: Left ABI 1.03 05/13/22 Implantable device outside of the clinic excluding No Right ABI .78 05/13/22 cellular tissue based products placed in the center since last visit: Pain Present Now: No Electronic Signature(s) Signed: 02/22/2023 3:55:47 PM By: Demetria Pore Entered By: Demetria Pore on 02/18/2023 13:14:27 -------------------------------------------------------------------------------- Encounter Discharge Information Details Patient Name: Date of Service: [name] 02/18/2023 10:00 A M Medical Record Number: [id number] Patient Account Number: [id number] Date of Birth/Sex: Treating RN: Mar 20, 1981 (42 y.o. M) Primary Care Fadi Menter: [name] Other Clinician: Referring Avalon Coppinger: Treating Brenee Gajda/Extender: Greta Doom in Treatment: 20 Encounter Discharge Information Items Discharge Condition: Stable Ambulatory Status: Ambulatory Discharge Destination:  Home Transportation: Private Auto Accompanied By: self Schedule Follow-up Appointment: Yes Clinical Summary of Care: MATTHE, [name] ([id number]) [id number] Page 2 of 2 Electronic Signature(s) Signed: 02/22/2023 3:55:47 PM By: Demetria Pore Entered By: Demetria Pore on 02/18/2023 13:26:00 -------------------------------------------------------------------------------- Vitals Details Patient Name: Date of Service: [name] 02/18/2023 10:00 A M Medical Record Number: [id number] Patient Account Number: [id number] Date of Birth/Sex: Treating RN: [birth year] (42 y.o. M) Primary Care Wah Sabic: [name] Other Clinician: Referring Jamese Trauger: Treating Masyn Fullam/Extender: Greta Doom in Treatment: 20 Vital Signs Time Taken: 10:00 Temperature (F): 98.0 Height (in): 73 Pulse (bpm): 92 Weight (lbs): 330 Respiratory Rate (breaths/min): 16 Body Mass Index (BMI): 43.5 Blood Pressure (mmHg): 150/80 Capillary Blood Glucose (mg/dl): 244 Reference Range: 80 - 120 mg / dl Airway Pulse Oximetry (%): 99 Electronic Signature(s) Signed: 02/22/2023 3:55:47 PM By: Demetria Pore Entered By: Demetria Pore on 02/18/2023 13:14:29

## 2023-02-22 NOTE — Progress Notes (Signed)
[name], [name] ([id number]) [id number] Page 1 of 2 Visit Report for 02/18/2023 HBO Details Patient Name: Date of Service: [name], [name] 02/18/2023 10:00 A M Medical Record Number: [id number] Patient Account Number: [id number] Date of Birth/Sex: Treating RN: [birth year] (42 y.o. M) Primary Care Sila Sarsfield: [name] Other Clinician: Referring Hoyle Barkdull: Treating Rowen Hur/Extender: Greta Doom in Treatment: 20 HBO Treatment Course Details Treatment Course Number: 1 Ordering Clorissa Gruenberg: Geralyn Corwin T Treatments Ordered: otal 40 HBO Treatment Start Date: 12/31/2022 HBO Indication: Chronic Refractory Osteomyelitis to osteomyelitis left foot HBO Treatment Details Treatment Number: 34 Patient Type: Outpatient Chamber Type: Monoplace Chamber Serial #: [id number] Treatment Protocol: 2.0 ATA with 90 minutes oxygen, with two 5 minute air breaks Treatment Details Compression Rate Down: 2.0 psi / minute De-Compression Rate Up: 1.5 psi / minute A breaks and breathing ir Compress Tx Pressure periods Decompress Decompress Begins Reached (leave unused spaces Begins Ends blank) Chamber Pressure (ATA 1 2 2 2 2 2  --2 1 ) Clock Time (24 hr) 10:01 10:11 10:42 10:47 11:17 11:22 - - 11:54 12:02 Treatment Length: 121 (minutes) Treatment Segments: 4 Vital Signs Capillary Blood Glucose Reference Range: 80 - 120 mg / dl HBO Diabetic Blood Glucose Intervention Range: <131 mg/dl or >875 mg/dl Time Vitals Blood Respiratory Capillary Blood Glucose Pulse Action Type: Pulse: Temperature: Taken: Pressure: Rate: Glucose (mg/dl): Meter #: Oximetry (%) Taken: Pre 10:00 150/80 92 16 98 445 1 99 Post 12:02 140/80 90 16 97.8 352 1 Treatment Response Treatment Toleration: Well Treatment Completion Status: Treatment Completed without Adverse Event Electronic Signature(s) Signed: 02/19/2023 2:21:22 PM By: Allen Derry PA-C Signed: 02/22/2023 3:55:47 PM  By: Demetria Pore Entered By: Demetria Pore on 02/18/2023 13:15:08 [name] ([id number]) [id number] Page 2 of 2 -------------------------------------------------------------------------------- HBO Safety Checklist Details Patient Name: Date of Service: [name], [name] 02/18/2023 10:00 A M Medical Record Number: [id number] Patient Account Number: [id number] Date of Birth/Sex: Treating RN: [birth year] (42 y.o. M) Primary Care Diallo Ponder: [name] Other Clinician: Referring Evanne Matsunaga: Treating Kamen Hanken/Extender: Greta Doom in Treatment: 20 HBO Safety Checklist Items Safety Checklist Consent Form Signed Patient voided / foley secured and emptied When did you last eato 02/18/23 Last dose of injectable or oral agent 02/18/23 Ostomy pouch emptied and vented if applicable NA All implantable devices assessed, documented and approved NA Intravenous access site secured and place NA Valuables secured Linens and cotton and cotton/polyester blend (less than 51% polyester) Personal oil-based products / skin lotions / body lotions removed Wigs or hairpieces removed NA Smoking or tobacco materials removed NA Books / newspapers / magazines / loose paper removed Cologne, aftershave, perfume and deodorant removed Jewelry removed (may wrap wedding band) Make-up removed NA Hair care products removed Battery operated devices (external) removed NA Heating patches and chemical warmers removed NA Titanium eyewear removed NA Nail polish cured greater than 10 hours NA Casting material cured greater than 10 hours NA Hearing aids removed NA Loose dentures or partials removed NA Prosthetics have been removed NA Patient demonstrates correct use of air break device (if applicable) Patient concerns have been addressed Patient grounding bracelet on and cord attached to chamber Specifics for Inpatients (complete in addition to  above) Medication sheet sent with patient NA Intravenous medications needed or due during therapy sent with patient NA Drainage tubes (e.g. nasogastric tube or chest tube secured and vented) NA Endotracheal or Tracheotomy tube secured NA Cuff deflated of air and inflated with saline NA Airway suctioned NA Electronic Signature(s) Signed:  02/22/2023 3:55:47 PM By: Demetria Pore Entered By: Demetria Pore on 02/18/2023 13:14:32

## 2023-02-23 ENCOUNTER — Encounter: Payer: 59 | Attending: Physician Assistant | Admitting: Physician Assistant

## 2023-02-23 DIAGNOSIS — L97512 Non-pressure chronic ulcer of other part of right foot with fat layer exposed: Secondary | ICD-10-CM | POA: Insufficient documentation

## 2023-02-23 DIAGNOSIS — E08621 Diabetes mellitus due to underlying condition with foot ulcer: Secondary | ICD-10-CM | POA: Insufficient documentation

## 2023-02-23 DIAGNOSIS — L97522 Non-pressure chronic ulcer of other part of left foot with fat layer exposed: Secondary | ICD-10-CM | POA: Insufficient documentation

## 2023-02-23 DIAGNOSIS — M86672 Other chronic osteomyelitis, left ankle and foot: Secondary | ICD-10-CM | POA: Insufficient documentation

## 2023-02-23 DIAGNOSIS — Z794 Long term (current) use of insulin: Secondary | ICD-10-CM | POA: Insufficient documentation

## 2023-02-23 DIAGNOSIS — I10 Essential (primary) hypertension: Secondary | ICD-10-CM | POA: Insufficient documentation

## 2023-02-23 DIAGNOSIS — E1140 Type 2 diabetes mellitus with diabetic neuropathy, unspecified: Secondary | ICD-10-CM | POA: Insufficient documentation

## 2023-02-23 DIAGNOSIS — Z89412 Acquired absence of left great toe: Secondary | ICD-10-CM | POA: Insufficient documentation

## 2023-02-23 DIAGNOSIS — Z89411 Acquired absence of right great toe: Secondary | ICD-10-CM | POA: Insufficient documentation

## 2023-02-24 ENCOUNTER — Inpatient Hospital Stay: Payer: 59

## 2023-02-24 ENCOUNTER — Encounter: Payer: 59 | Admitting: Internal Medicine

## 2023-02-24 ENCOUNTER — Other Ambulatory Visit: Payer: Self-pay

## 2023-02-24 ENCOUNTER — Inpatient Hospital Stay
Admission: EM | Admit: 2023-02-24 | Discharge: 2023-02-28 | DRG: 617 | Disposition: A | Discharge status: Home / Self Care | Payer: 59 | Source: Ambulatory Visit | Attending: Osteopathic Medicine | Admitting: Osteopathic Medicine

## 2023-02-24 ENCOUNTER — Emergency Department: Payer: 59

## 2023-02-24 ENCOUNTER — Ambulatory Visit: Payer: 59 | Admitting: Physician Assistant

## 2023-02-24 DIAGNOSIS — M7731 Calcaneal spur, right foot: Secondary | ICD-10-CM | POA: Diagnosis not present

## 2023-02-24 DIAGNOSIS — T8743 Infection of amputation stump, right lower extremity: Secondary | ICD-10-CM | POA: Diagnosis not present

## 2023-02-24 DIAGNOSIS — M19071 Primary osteoarthritis, right ankle and foot: Secondary | ICD-10-CM | POA: Diagnosis not present

## 2023-02-24 DIAGNOSIS — Z794 Long term (current) use of insulin: Secondary | ICD-10-CM

## 2023-02-24 DIAGNOSIS — Z91011 Allergy to milk products: Secondary | ICD-10-CM

## 2023-02-24 DIAGNOSIS — L97512 Non-pressure chronic ulcer of other part of right foot with fat layer exposed: Secondary | ICD-10-CM | POA: Diagnosis not present

## 2023-02-24 DIAGNOSIS — F319 Bipolar disorder, unspecified: Secondary | ICD-10-CM | POA: Diagnosis present

## 2023-02-24 DIAGNOSIS — M86171 Other acute osteomyelitis, right ankle and foot: Secondary | ICD-10-CM | POA: Diagnosis not present

## 2023-02-24 DIAGNOSIS — E11621 Type 2 diabetes mellitus with foot ulcer: Secondary | ICD-10-CM | POA: Diagnosis not present

## 2023-02-24 DIAGNOSIS — Z7984 Long term (current) use of oral hypoglycemic drugs: Secondary | ICD-10-CM | POA: Diagnosis not present

## 2023-02-24 DIAGNOSIS — F419 Anxiety disorder, unspecified: Secondary | ICD-10-CM | POA: Diagnosis not present

## 2023-02-24 DIAGNOSIS — F909 Attention-deficit hyperactivity disorder, unspecified type: Secondary | ICD-10-CM | POA: Diagnosis not present

## 2023-02-24 DIAGNOSIS — E1142 Type 2 diabetes mellitus with diabetic polyneuropathy: Secondary | ICD-10-CM | POA: Diagnosis not present

## 2023-02-24 DIAGNOSIS — Z89411 Acquired absence of right great toe: Secondary | ICD-10-CM

## 2023-02-24 DIAGNOSIS — L03031 Cellulitis of right toe: Secondary | ICD-10-CM | POA: Diagnosis not present

## 2023-02-24 DIAGNOSIS — E11628 Type 2 diabetes mellitus with other skin complications: Principal | ICD-10-CM

## 2023-02-24 DIAGNOSIS — L97519 Non-pressure chronic ulcer of other part of right foot with unspecified severity: Secondary | ICD-10-CM | POA: Diagnosis not present

## 2023-02-24 DIAGNOSIS — Z89421 Acquired absence of other right toe(s): Secondary | ICD-10-CM | POA: Diagnosis not present

## 2023-02-24 DIAGNOSIS — Z79899 Other long term (current) drug therapy: Secondary | ICD-10-CM

## 2023-02-24 DIAGNOSIS — Z6841 Body Mass Index (BMI) 40.0 and over, adult: Secondary | ICD-10-CM | POA: Diagnosis not present

## 2023-02-24 DIAGNOSIS — F32A Depression, unspecified: Secondary | ICD-10-CM | POA: Diagnosis not present

## 2023-02-24 DIAGNOSIS — E1165 Type 2 diabetes mellitus with hyperglycemia: Secondary | ICD-10-CM | POA: Diagnosis not present

## 2023-02-24 DIAGNOSIS — Z560 Unemployment, unspecified: Secondary | ICD-10-CM

## 2023-02-24 DIAGNOSIS — I872 Venous insufficiency (chronic) (peripheral): Secondary | ICD-10-CM | POA: Diagnosis not present

## 2023-02-24 DIAGNOSIS — M869 Osteomyelitis, unspecified: Secondary | ICD-10-CM | POA: Diagnosis present

## 2023-02-24 DIAGNOSIS — R600 Localized edema: Secondary | ICD-10-CM

## 2023-02-24 DIAGNOSIS — I10 Essential (primary) hypertension: Secondary | ICD-10-CM | POA: Diagnosis not present

## 2023-02-24 DIAGNOSIS — M86271 Subacute osteomyelitis, right ankle and foot: Secondary | ICD-10-CM

## 2023-02-24 DIAGNOSIS — L089 Local infection of the skin and subcutaneous tissue, unspecified: Principal | ICD-10-CM | POA: Diagnosis present

## 2023-02-24 DIAGNOSIS — E1110 Type 2 diabetes mellitus with ketoacidosis without coma: Secondary | ICD-10-CM | POA: Diagnosis not present

## 2023-02-24 DIAGNOSIS — E119 Type 2 diabetes mellitus without complications: Secondary | ICD-10-CM | POA: Diagnosis not present

## 2023-02-24 DIAGNOSIS — L97521 Non-pressure chronic ulcer of other part of left foot limited to breakdown of skin: Secondary | ICD-10-CM | POA: Diagnosis not present

## 2023-02-24 DIAGNOSIS — Z8249 Family history of ischemic heart disease and other diseases of the circulatory system: Secondary | ICD-10-CM

## 2023-02-24 DIAGNOSIS — E1151 Type 2 diabetes mellitus with diabetic peripheral angiopathy without gangrene: Secondary | ICD-10-CM | POA: Diagnosis present

## 2023-02-24 DIAGNOSIS — Z83438 Family history of other disorder of lipoprotein metabolism and other lipidemia: Secondary | ICD-10-CM

## 2023-02-24 DIAGNOSIS — F3341 Major depressive disorder, recurrent, in partial remission: Secondary | ICD-10-CM | POA: Diagnosis present

## 2023-02-24 DIAGNOSIS — L02611 Cutaneous abscess of right foot: Secondary | ICD-10-CM | POA: Diagnosis not present

## 2023-02-24 DIAGNOSIS — M7989 Other specified soft tissue disorders: Secondary | ICD-10-CM | POA: Diagnosis not present

## 2023-02-24 DIAGNOSIS — E1169 Type 2 diabetes mellitus with other specified complication: Principal | ICD-10-CM | POA: Diagnosis present

## 2023-02-24 DIAGNOSIS — S91301A Unspecified open wound, right foot, initial encounter: Secondary | ICD-10-CM | POA: Diagnosis not present

## 2023-02-24 DIAGNOSIS — E785 Hyperlipidemia, unspecified: Secondary | ICD-10-CM | POA: Diagnosis not present

## 2023-02-24 DIAGNOSIS — M86672 Other chronic osteomyelitis, left ankle and foot: Secondary | ICD-10-CM | POA: Diagnosis present

## 2023-02-24 DIAGNOSIS — Z792 Long term (current) use of antibiotics: Secondary | ICD-10-CM

## 2023-02-24 DIAGNOSIS — Z833 Family history of diabetes mellitus: Secondary | ICD-10-CM

## 2023-02-24 DIAGNOSIS — Z89412 Acquired absence of left great toe: Secondary | ICD-10-CM | POA: Diagnosis not present

## 2023-02-24 DIAGNOSIS — M25475 Effusion, left foot: Secondary | ICD-10-CM | POA: Diagnosis not present

## 2023-02-24 LAB — COMPREHENSIVE METABOLIC PANEL
ALT: 15 U/L (ref 0–44)
AST: 13 U/L — ABNORMAL LOW (ref 15–41)
Albumin: 4.3 g/dL (ref 3.5–5.0)
Alkaline Phosphatase: 95 U/L (ref 38–126)
Anion gap: 12 (ref 5–15)
BUN: 14 mg/dL (ref 6–20)
CO2: 25 mmol/L (ref 22–32)
Calcium: 9.2 mg/dL (ref 8.9–10.3)
Chloride: 97 mmol/L — ABNORMAL LOW (ref 98–111)
Creatinine, Ser: 0.82 mg/dL (ref 0.61–1.24)
GFR, Estimated: >60 mL/min (ref >60)
Glucose, Bld: 345 mg/dL — ABNORMAL HIGH (ref 70–99)
Potassium: 4.1 mmol/L (ref 3.5–5.1)
Sodium: 134 mmol/L — ABNORMAL LOW (ref 135–145)
Total Bilirubin: 1.7 mg/dL — ABNORMAL HIGH (ref 0.3–1.2)
Total Protein: 9.2 g/dL — ABNORMAL HIGH (ref 6.5–8.1)

## 2023-02-24 LAB — GLUCOSE, CAPILLARY
Glucose-Capillary: 320 mg/dL — ABNORMAL HIGH (ref 70–99)
Glucose-Capillary: 311 mg/dL — ABNORMAL HIGH (ref 70–99)
Glucose-Capillary: 228 mg/dL — ABNORMAL HIGH (ref 70–99)
Glucose-Capillary: 257 mg/dL — ABNORMAL HIGH (ref 70–99)

## 2023-02-24 LAB — URINALYSIS, W/ REFLEX TO CULTURE (INFECTION SUSPECTED)
Bacteria, UA: NONE SEEN
Bilirubin Urine: NEGATIVE
Glucose, UA: >=500 mg/dL — AB
Hgb urine dipstick: NEGATIVE
Ketones, ur: 20 mg/dL — AB
Leukocytes,Ua: NEGATIVE
Nitrite: NEGATIVE
Protein, ur: NEGATIVE mg/dL
Specific Gravity, Urine: 1.033 — ABNORMAL HIGH (ref 1.005–1.030)
pH: 5 (ref 5.0–8.0)

## 2023-02-24 LAB — CBC WITH DIFFERENTIAL/PLATELET
Abs Immature Granulocytes: 0.06 10*3/uL (ref 0.00–0.07)
Basophils Absolute: 0.1 10*3/uL (ref 0.0–0.1)
Basophils Relative: 1 %
Eosinophils Absolute: 0.3 10*3/uL (ref 0.0–0.5)
Eosinophils Relative: 2 %
HCT: 45.9 % (ref 39.0–52.0)
Hemoglobin: 15.6 g/dL (ref 13.0–17.0)
Immature Granulocytes: 0 %
Lymphocytes Relative: 22 %
Lymphs Abs: 3 10*3/uL (ref 0.7–4.0)
MCH: 27.8 pg (ref 26.0–34.0)
MCHC: 34 g/dL (ref 30.0–36.0)
MCV: 81.7 fL (ref 80.0–100.0)
Monocytes Absolute: 1.1 10*3/uL — ABNORMAL HIGH (ref 0.1–1.0)
Monocytes Relative: 8 %
Neutro Abs: 9.2 10*3/uL — ABNORMAL HIGH (ref 1.7–7.7)
Neutrophils Relative %: 67 %
Platelets: 341 10*3/uL (ref 150–400)
RBC: 5.62 MIL/uL (ref 4.22–5.81)
RDW: 13.2 % (ref 11.5–15.5)
WBC: 13.7 10*3/uL — ABNORMAL HIGH (ref 4.0–10.5)
nRBC: 0 % (ref 0.0–0.2)

## 2023-02-24 LAB — LACTIC ACID, PLASMA
Lactic Acid, Venous: 2.4 mmol/L (ref 0.5–1.9)
Lactic Acid, Venous: 1.6 mmol/L (ref 0.5–1.9)

## 2023-02-24 LAB — HIV ANTIBODY (ROUTINE TESTING W REFLEX): HIV Screen 4th Generation wRfx: NONREACTIVE

## 2023-02-24 LAB — HEMOGLOBIN A1C
Hgb A1c MFr Bld: 9.6 % — ABNORMAL HIGH (ref 4.8–5.6)
Mean Plasma Glucose: 228.82 mg/dL

## 2023-02-24 MED ORDER — ATOMOXETINE HCL 40 MG PO CAPS
80.0000 mg | ORAL_CAPSULE | Freq: Every day | ORAL | Status: DC
  Administered 2023-02-26 – 2023-02-28 (×3): 80 mg via ORAL
  Filled 2023-02-24 (×4): qty 2

## 2023-02-24 MED ORDER — SENNOSIDES-DOCUSATE SODIUM 8.6-50 MG PO TABS: 1.0000 | ORAL_TABLET | Freq: Every evening | ORAL | Status: DC | PRN

## 2023-02-24 MED ORDER — ONDANSETRON HCL 4 MG/2ML IJ SOLN: 4.0000 mg | Freq: Four times a day (QID) | INTRAMUSCULAR | Status: DC | PRN

## 2023-02-24 MED ORDER — INSULIN ASPART 100 UNIT/ML IJ SOLN
0.0000 [IU] | Freq: Three times a day (TID) | INTRAMUSCULAR | Status: DC
  Administered 2023-02-24 – 2023-02-25 (×2): 7 [IU] via SUBCUTANEOUS
  Administered 2023-02-25: 6 [IU] via SUBCUTANEOUS
  Administered 2023-02-26 (×2): 7 [IU] via SUBCUTANEOUS
  Administered 2023-02-26 – 2023-02-27 (×2): 4 [IU] via SUBCUTANEOUS
  Administered 2023-02-27: 7 [IU] via SUBCUTANEOUS
  Administered 2023-02-27 – 2023-02-28 (×2): 4 [IU] via SUBCUTANEOUS
  Administered 2023-02-28: 7 [IU] via SUBCUTANEOUS
  Filled 2023-02-24 (×11): qty 1

## 2023-02-24 MED ORDER — VANCOMYCIN HCL 2000 MG/400ML IV SOLN
2000.0000 mg | Freq: Once | INTRAVENOUS | Status: AC
  Administered 2023-02-24: 2000 mg via INTRAVENOUS
  Filled 2023-02-24: qty 400

## 2023-02-24 MED ORDER — ONDANSETRON HCL 4 MG PO TABS: 4.0000 mg | ORAL_TABLET | Freq: Four times a day (QID) | ORAL | Status: DC | PRN

## 2023-02-24 MED ORDER — SODIUM CHLORIDE 0.9 % IV SOLN
2.0000 g | Freq: Three times a day (TID) | INTRAVENOUS | Status: DC
  Administered 2023-02-24 – 2023-02-28 (×11): 2 g via INTRAVENOUS
  Filled 2023-02-24 (×12): qty 12.5

## 2023-02-24 MED ORDER — SERTRALINE HCL 50 MG PO TABS
50.0000 mg | ORAL_TABLET | Freq: Every day | ORAL | Status: DC
  Administered 2023-02-24 – 2023-02-28 (×4): 50 mg via ORAL
  Filled 2023-02-24 (×4): qty 1

## 2023-02-24 MED ORDER — SODIUM CHLORIDE 0.9 % IV BOLUS (SEPSIS)
1000.0000 mL | Freq: Once | INTRAVENOUS | Status: AC
  Administered 2023-02-24: 1000 mL via INTRAVENOUS

## 2023-02-24 MED ORDER — INSULIN ASPART 100 UNIT/ML IJ SOLN
0.0000 [IU] | Freq: Every day | INTRAMUSCULAR | Status: DC
  Administered 2023-02-24: 3 [IU] via SUBCUTANEOUS
  Filled 2023-02-24: qty 1

## 2023-02-24 MED ORDER — ENOXAPARIN SODIUM 80 MG/0.8ML IJ SOSY
70.0000 mg | PREFILLED_SYRINGE | INTRAMUSCULAR | Status: DC
  Administered 2023-02-24 – 2023-02-27 (×4): 70 mg via SUBCUTANEOUS
  Filled 2023-02-24 (×5): qty 0.7

## 2023-02-24 MED ORDER — RISAQUAD PO CAPS
1.0000 | ORAL_CAPSULE | Freq: Three times a day (TID) | ORAL | Status: DC
  Administered 2023-02-24 – 2023-02-27 (×9): 1 via ORAL
  Filled 2023-02-24 (×10): qty 1

## 2023-02-24 MED ORDER — PRAVASTATIN SODIUM 20 MG PO TABS
40.0000 mg | ORAL_TABLET | Freq: Every day | ORAL | Status: DC
  Administered 2023-02-24 – 2023-02-27 (×4): 40 mg via ORAL
  Filled 2023-02-24 (×4): qty 2

## 2023-02-24 MED ORDER — ACETAMINOPHEN 650 MG RE SUPP: 650.0000 mg | Freq: Four times a day (QID) | RECTAL | Status: DC | PRN

## 2023-02-24 MED ORDER — VANCOMYCIN HCL IN DEXTROSE 1-5 GM/200ML-% IV SOLN: 1000.0000 mg | Freq: Once | INTRAVENOUS | Status: DC

## 2023-02-24 MED ORDER — INSULIN GLARGINE-YFGN 100 UNIT/ML ~~LOC~~ SOLN
60.0000 [IU] | Freq: Every day | SUBCUTANEOUS | Status: DC
  Administered 2023-02-24 – 2023-02-27 (×4): 60 [IU] via SUBCUTANEOUS
  Filled 2023-02-24 (×6): qty 0.6

## 2023-02-24 MED ORDER — OXYCODONE HCL 5 MG PO TABS: 5.0000 mg | ORAL_TABLET | ORAL | Status: DC | PRN

## 2023-02-24 MED ORDER — VANCOMYCIN HCL 2000 MG/400ML IV SOLN
2000.0000 mg | Freq: Two times a day (BID) | INTRAVENOUS | Status: DC
  Administered 2023-02-25 – 2023-02-28 (×7): 2000 mg via INTRAVENOUS
  Filled 2023-02-24 (×9): qty 400

## 2023-02-24 MED ORDER — ACETAMINOPHEN 325 MG PO TABS
650.0000 mg | ORAL_TABLET | Freq: Four times a day (QID) | ORAL | Status: DC | PRN
  Administered 2023-02-24 – 2023-02-25 (×2): 650 mg via ORAL
  Filled 2023-02-24 (×2): qty 2

## 2023-02-24 MED ORDER — POVIDONE-IODINE 10 % EX SWAB: 2.0000 | Freq: Once | CUTANEOUS | Status: DC

## 2023-02-24 MED ORDER — METRONIDAZOLE 500 MG/100ML IV SOLN
500.0000 mg | Freq: Two times a day (BID) | INTRAVENOUS | Status: DC
  Administered 2023-02-25 – 2023-02-28 (×7): 500 mg via INTRAVENOUS
  Filled 2023-02-24 (×8): qty 100

## 2023-02-24 MED ORDER — VANCOMYCIN HCL 500 MG/100ML IV SOLN
500.0000 mg | Freq: Once | INTRAVENOUS | Status: AC
  Administered 2023-02-24: 500 mg via INTRAVENOUS
  Filled 2023-02-24: qty 100

## 2023-02-24 MED ORDER — SODIUM CHLORIDE 0.9 % IV SOLN
2.0000 g | Freq: Once | INTRAVENOUS | Status: AC
  Administered 2023-02-24: 2 g via INTRAVENOUS
  Filled 2023-02-24: qty 12.5

## 2023-02-24 MED ORDER — METRONIDAZOLE 500 MG/100ML IV SOLN
500.0000 mg | Freq: Once | INTRAVENOUS | Status: AC
  Administered 2023-02-24: 500 mg via INTRAVENOUS
  Filled 2023-02-24: qty 100

## 2023-02-24 NOTE — ED Triage Notes (Signed)
Pt sts that he has a worsening wound on his right foot that has been going on for some time. Pt sts that the infection has gottten worse over the weekend and was seen at the wound center today and was told to come over to the ED for IV antibiotics and possible admission with a surgery consult.

## 2023-02-24 NOTE — ED Provider Notes (Signed)
[name] Provider Note    Event Date/Time   First MD Initiated Contact with Patient 02/24/23 1336     (approximate)   History   Foot Injury   HPI  [name] is a 42 year old male with history of T2DM, chronic osteomyelitis of the left foot presenting to the emergency department for evaluation of right foot wound.  In early September, patient began to develop a wound over his right leg.  He has been seen regularly by wound care.  Per notes from his wound care doctor, last week the area of his right metatarsal head had deteriorated so the callus was removed as well as necrotic subcutaneous tissue that did not probe to the bone.  His culture grew Staph aureus with sensitivities not yet available.  He was given empiric doxycycline.  Today, he presented back to the clinic with worsening erythema in the fore foot with purulent drainage along the first metatarsal head and spreading erythema to the dorsal foot.  There is concerns for diabetic foot infection so patient was sent to the ER for further evaluation.  Patient reports chills yesterday.  No nausea, vomiting, chest pain, shortness of breath.    Physical Exam   Triage Vital Signs: ED Triage Vitals [02/24/23 1240]  Encounter Vitals Group     BP (!) 111/90     Systolic BP Percentile      Diastolic BP Percentile      Pulse Rate (!) 106     Resp 17     Temp 97.7 F (36.5 C)     Temp Source Oral     SpO2 100 %     Weight (!) 320 lb (145.2 kg)     Height 6\' 1"  (1.854 m)     Head Circumference      Peak Flow      Pain Score 0     Pain Loc      Pain Education      Exclude from Growth Chart     Most recent vital signs: Vitals:   02/24/23 1240  BP: (!) 111/90  Pulse: (!) 106  Resp: 17  Temp: 97.7 F (36.5 C)  SpO2: 100%     General: Awake, interactive  CV:  Tachycardic with regular rhythm Resp:  Unlabored respirations Abd:  Soft, nondistended.  Neuro:  Symmetric facial movement,  fluid speech MSK:  Erythema of the right foot with purulent drainage from forefoot, s/p great toe amputation, erythema extends into the forefoot with some tenderness along the calf, though patient reports erythema in this area is improved.  See pictures below.        ED Results / Procedures / Treatments   Labs (all labs ordered are listed, but only abnormal results are displayed) Labs Reviewed  LACTIC ACID, PLASMA - Abnormal; Notable for the following components:      Result Value   Lactic Acid, Venous 2.4 (*)    All other components within normal limits  COMPREHENSIVE METABOLIC PANEL - Abnormal; Notable for the following components:   Sodium 134 (*)    Chloride 97 (*)    Glucose, Bld 345 (*)    Total Protein 9.2 (*)    AST 13 (*)    Total Bilirubin 1.7 (*)    All other components within normal limits  CBC WITH DIFFERENTIAL/PLATELET - Abnormal; Notable for the following components:   WBC 13.7 (*)    Neutro Abs 9.2 (*)    Monocytes Absolute 1.1 (*)  All other components within normal limits  CULTURE, BLOOD (SINGLE)  CULTURE, BLOOD (SINGLE)  LACTIC ACID, PLASMA  URINALYSIS, W/ REFLEX TO CULTURE (INFECTION SUSPECTED)     EKG EKG independently reviewed interpreted by myself (ER attending) demonstrates:    RADIOLOGY Imaging independently reviewed and interpreted by myself demonstrates:  X-Phoenyx [name] demonstrates prior great toe amputation, radiology does not note any findings suggestive of osteomyelitis  PROCEDURES:  Critical Care performed: Yes, see critical care procedure note(s)  CRITICAL CARE Performed by: [name]   Total critical care time: 31 minutes  Critical care time was exclusive of separately billable procedures and treating other patients.  Critical care was necessary to treat or prevent imminent or life-threatening deterioration.  Critical care was time spent personally by me on the following activities: development of treatment plan with patient and/or  surrogate as well as nursing, discussions with consultants, evaluation of patient's response to treatment, examination of patient, obtaining history from patient or surrogate, ordering and performing treatments and interventions, ordering and review of laboratory studies, ordering and review of radiographic studies, pulse oximetry and re-evaluation of patient's condition.   Procedures   MEDICATIONS ORDERED IN ED: Medications  sodium chloride 0.9 % bolus 1,000 mL (1,000 mLs Intravenous New Bag/Given 02/24/23 1459)  metroNIDAZOLE (FLAGYL) IVPB 500 mg (500 mg Intravenous New Bag/Given 02/24/23 1507)  vancomycin (VANCOREADY) IVPB 2000 mg/400 mL (has no administration in time range)  ceFEPIme (MAXIPIME) 2 g in sodium chloride 0.9 % 100 mL IVPB (2 g Intravenous New Bag/Given 02/24/23 1458)     IMPRESSION / MDM / ASSESSMENT AND PLAN / ED COURSE  I reviewed the triage vital signs and the nursing notes.  Differential diagnosis includes, but is not limited to, diabetic foot infection, osteomyelitis, abscess  Patient's presentation is most consistent with acute presentation with potential threat to life or bodily function.  42 year old male presenting to the emergency department for evaluation of right foot diabetic foot infection.  Tachycardic on presentation here.  Labs with leukocytosis.  Lactate elevated at 2.4.  Sepsis orders initiated with broad-spectrum antibiotics.  Ordered for a liter of IV fluids.  X-[name] here without obvious signs of osteomyelitis, but do think patient remains appropriate for admission given overall clinical presentation.  Case was reviewed with Dr. Excell [name] with podiatry (has previously seen Dr. Lajoyce [name] with Ortho at [facility]).  Was agreeable with plan for admission, inpatient MRI, and hospitalist admission.  He will come evaluate the patient.  Will reach to hospitalist team.  Case reviewed with Dr. Chipper [name].  He will evaluate the patient for anticipated admission.     FINAL  CLINICAL IMPRESSION(S) / ED DIAGNOSES   Final diagnoses:  Diabetic foot infection (HCC)     Rx / DC Orders   ED Discharge Orders     None        Note:  This document was prepared using Dragon voice recognition software and may include unintentional dictation errors.   [name] Post, MD 02/24/23 1537

## 2023-02-24 NOTE — Progress Notes (Signed)
NIKOLI, NASSER ([id number]) [id number] Page 1 of 2 Visit Report for 02/24/2023 Arrival Information Details Patient Name: Date of Service: MANDELL, PANGBORN 02/24/2023 8:30 A M Medical Record Number: [id number] Patient Account Number: [id number] Date of Birth/Sex: Treating RN: May 19, 1981 (42 y.o. M) Primary Care Nastassja Witkop: [name] Other Clinician: Referring Jeda Pardue: Treating Elexius Minar/Extender: [name], [name] in Treatment: 21 Visit Information History Since Last Visit Added or deleted any medications: No Patient Arrived: Ambulatory Any new allergies or adverse reactions: No Arrival Time: 08:39 Had a fall or experienced change in No Accompanied By: self activities of daily living that may affect Transfer Assistance: None risk of falls: Patient Identification Verified: Yes Signs or symptoms of abuse/neglect since last visito No Secondary Verification Process Completed: Yes Hospitalized since last visit: No Patient Requires Transmission-Based Precautions: No Implantable device outside of the clinic excluding No Patient Has Alerts: Yes cellular tissue based products placed in the center Patient Alerts: Left ABI 1.03 05/13/22 since last visit: Right ABI .78 05/13/22 Pain Present Now: No Electronic Signature(s) Signed: 02/24/2023 11:12:27 AM By: Demetria Pore Entered By: Demetria Pore on 02/24/2023 08:07:29 -------------------------------------------------------------------------------- Encounter Discharge Information Details Patient Name: Date of Service: [name] 02/24/2023 8:30 A M Medical Record Number: [id number] Patient Account Number: [id number] Date of Birth/Sex: Treating RN: January 07, 1981 (42 y.o. M) Primary Care Felder Lebeda: [name] Other Clinician: Referring Danika Kluender: Treating Osborn Pullin/Extender: [name], [name] in Treatment: 21 Encounter Discharge Information  Items Discharge Condition: Stable Ambulatory Status: Ambulatory Discharge Destination: Home Transportation: Private Auto Accompanied By: self Schedule Follow-up Appointment: Yes Clinical Summary of Care: ROMANI, WILBON ([id number]) [id number] Page 2 of 2 Electronic Signature(s) Signed: 02/24/2023 11:12:27 AM By: Demetria Pore Entered By: Demetria Pore on 02/24/2023 08:12:11 -------------------------------------------------------------------------------- Vitals Details Patient Name: Date of Service: [name] 02/24/2023 8:30 A M Medical Record Number: [id number] Patient Account Number: [id number] Date of Birth/Sex: Treating RN: 10-Apr-1981 (42 y.o. M) Primary Care Delray Reza: [name] Other Clinician: Referring Teghan Philbin: Treating Ander Wamser/Extender: [name], [name] in Treatment: 21 Vital Signs Time Taken: 08:20 Temperature (F): 98.0 Height (in): 73 Pulse (bpm): 98 Weight (lbs): 330 Respiratory Rate (breaths/min): 16 Body Mass Index (BMI): 43.5 Blood Pressure (mmHg): 118/80 Capillary Blood Glucose (mg/dl): 244 Reference Range: 80 - 120 mg / dl Airway Pulse Oximetry (%): 99 Electronic Signature(s) Signed: 02/24/2023 11:12:27 AM By: Demetria Pore Entered By: Demetria Pore on 02/24/2023 08:07:31

## 2023-02-24 NOTE — Consult Note (Signed)
CODE SEPSIS - PHARMACY COMMUNICATION  **Broad Spectrum Antibiotics should be administered within 1 hour of Sepsis diagnosis**  Time Code Sepsis Called/Page Received: 1437  Antibiotics Ordered: vancomycin, cefepime, and Flagyl  Time of 1st antibiotic administration: 1458  Additional action taken by pharmacy: N/A  Barrie Folk ,PharmD Clinical Pharmacist  02/24/2023  2:43 PM

## 2023-02-24 NOTE — Sepsis Progress Note (Signed)
eLink is following this Code Sepsis. °

## 2023-02-24 NOTE — H&P (Signed)
History and Physical    [name] XBJ:[id number] DOB: 03-06-1981 DOA: 02/24/2023  PCP: Marcine Matar, MD (Confirm with patient/family/NH records and if not entered, this has to be entered at [facility] point of entry) Patient coming from: Home  I have personally briefly reviewed patient's old medical records in [facility]  Chief Complaint: Right foot infection  HPI: [name] is a 42 y.o. male with medical history significant of IDDM with insulin resistance, HTN, HLD, PVD, anxiety/depression, presented with worsening of right foot infection.  Patient has a chronic left foot osteomyelitis for which he has been following with podiatry and wound care, treatment including wound care, chronic antibiotics and bariatric chamber since summer.  However about 7 days ago patient started to develop a new ulcer on the right foot plantar side of the forefoot, with rash, purulent discharge and swelling.  Patient was started on antibiotics doxycycline 7 days ago.  However over the weekend patient started to see more rash and pain associated with the right foot, extended to right calf area.  Yesterday patient started to have episode of chills but no fever.  Today, patient will see wound care physician who sent the patient to ED.  ED Course: Afebrile, borderline tachycardia borderline hypotensive.  X-ray of right foot showed cortical thickening of the second metatarsal shaft and widened second metatarsophalangeal joint space no significant signs for osteomyelitis.  Blood work showed WBC 13.7, creatinine 0.8, K4.1.  Patient was started on vancomycin cefepime and Flagyl.  Podiatry Dr. Lajoyce Corners was consulted.  Review of Systems: As per HPI otherwise 14 point review of systems negative.    Past Medical History:  Diagnosis Date   ADHD    Anxiety    Bipolar disorder (HCC)    Depression    Diabetes mellitus without complication (HCC)    type 2   Heart murmur    as a child   HLD  (hyperlipidemia)    Hypertension    no meds   Peripheral vascular disease (HCC)     Past Surgical History:  Procedure Laterality Date   AMPUTATION Left 03/06/2020   Procedure: LEFT GREAT TOE AMPUTATION;  Surgeon: [name], MD;  Location: [facility] OR;  Service: Orthopedics;  Laterality: Left;   AMPUTATION Right 10/15/2021   Procedure: RIGHT GREAT TOE AMPUTATION;  Surgeon: [name], MD;  Location: [facility] OR;  Service: Orthopedics;  Laterality: Right;   EYE SURGERY Bilateral    lasik   WISDOM TOOTH EXTRACTION       reports that he has never smoked. He has never used smokeless tobacco. He reports that he does not currently use alcohol. He reports that he does not use drugs.  Allergies  Allergen Reactions   Milk-Related Compounds Diarrhea    Family History  Problem Relation Age of Onset   Diabetes Mother    Hyperlipidemia Father    Hypertension Father      Prior to Admission medications   Medication Sig Start Date End Date Taking? Authorizing Provider  atomoxetine (STRATTERA) 80 MG capsule Take 1 capsule (80 mg total) by mouth daily. 12/31/22  Yes Marcine Matar, MD  doxycycline (MONODOX) 100 MG capsule Take 1 capsule (100 mg total) by mouth 2 (two) times daily for 7 days 02/17/23  Yes   Insulin Glargine (BASAGLAR KWIKPEN) 100 UNIT/ML Inject 60 Units into the skin at bedtime. 01/21/23  Yes Marcine Matar, MD  metFORMIN (GLUCOPHAGE-XR) 500 MG 24 hr tablet Take 4 tablets (2,000  mg total) by mouth daily with breakfast. 12/31/22  Yes Marcine Matar, MD  pravastatin (PRAVACHOL) 40 MG tablet Take 1 tablet (40 mg total) by mouth once daily. 12/31/22  Yes Marcine Matar, MD  sertraline (ZOLOFT) 50 MG tablet Take 1 tablet (50 mg total) by mouth daily. 12/31/22  Yes Marcine Matar, MD  blood glucose meter kit and supplies Dispense based on patient and insurance preference. Use up to four times daily as directed. (FOR ICD-10 E10.9, E11.9). 03/08/20   Bobbye Morton, MD   ciprofloxacin (CIPRO) 500 MG tablet Take 1 tablet (500 mg total) by mouth 2 (two) times daily for 10 days Patient not taking: Reported on 02/24/2023 12/28/22     doxycycline (VIBRA-TABS) 100 MG tablet Take 1 tablet (100 mg total) by mouth 2 (two) times daily for 14 days Patient not taking: Reported on 02/24/2023 12/02/22     Dulaglutide (TRULICITY) 0.75 MG/0.5ML SOPN Inject 0.75 mg into the skin once a week. Patient not taking: Reported on 02/24/2023 12/31/22   Marcine Matar, MD  Insulin Pen Needle (PEN NEEDLES) 31G X 8 MM MISC UAD 05/30/20   Marcine Matar, MD    Physical Exam: Vitals:   02/24/23 1240  BP: (!) 111/90  Pulse: (!) 106  Resp: 17  Temp: 97.7 F (36.5 C)  TempSrc: Oral  SpO2: 100%  Weight: (!) 145.2 kg  Height: 6\' 1"  (1.854 m)    Constitutional: NAD, calm, comfortable Vitals:   02/24/23 1240  BP: (!) 111/90  Pulse: (!) 106  Resp: 17  Temp: 97.7 F (36.5 C)  TempSrc: Oral  SpO2: 100%  Weight: (!) 145.2 kg  Height: 6\' 1"  (1.854 m)   Eyes: PERRL, lids and conjunctivae normal ENMT: Mucous membranes are moist. Posterior pharynx clear of any exudate or lesions.Normal dentition.  Neck: normal, supple, no masses, no thyromegaly Respiratory: clear to auscultation bilaterally, no wheezing, no crackles. Normal respiratory effort. No accessory muscle use.  Cardiovascular: Regular rate and rhythm, no murmurs / rubs / gallops. No extremity edema. 2+ pedal pulses. No carotid bruits.  Abdomen: no tenderness, no masses palpated. No hepatosplenomegaly. Bowel sounds positive.  Musculoskeletal: no clubbing / cyanosis. No joint deformity upper and lower extremities. Good ROM, no contractures. Normal muscle tone.  Skin: Large ulcer on the right forefoot with purulent discharge with scab and surrounding edema and purulent discharge Neurologic: CN 2-12 grossly intact. Sensation intact, DTR normal. Strength 5/5 in all 4.  Psychiatric: Normal judgment and insight. Alert and oriented  x 3. Normal mood.     Labs on Admission: I have personally reviewed following labs and imaging studies  CBC: Recent Labs  Lab 02/24/23 1244  WBC 13.7*  NEUTROABS 9.2*  HGB 15.6  HCT 45.9  MCV 81.7  PLT 341   Basic Metabolic Panel: Recent Labs  Lab 02/24/23 1244  NA 134*  K 4.1  CL 97*  CO2 25  GLUCOSE 345*  BUN 14  CREATININE 0.82  CALCIUM 9.2   GFR: Estimated Creatinine Clearance: 174.2 mL/min (by C-G formula based on SCr of 0.82 mg/dL). Liver Function Tests: Recent Labs  Lab 02/24/23 1244  AST 13*  ALT 15  ALKPHOS 95  BILITOT 1.7*  PROT 9.2*  ALBUMIN 4.3   No results for input(s): "LIPASE", "AMYLASE" in the last 168 hours. No results for input(s): "AMMONIA" in the last 168 hours. Coagulation Profile: No results for input(s): "INR", "PROTIME" in the last 168 hours. Cardiac Enzymes: No results for  input(s): "CKTOTAL", "CKMB", "CKMBINDEX", "TROPONINI" in the last 168 hours. BNP (last 3 results) No results for input(s): "PROBNP" in the last 8760 hours. HbA1C: No results for input(s): "HGBA1C" in the last 72 hours. CBG: Recent Labs  Lab 02/18/23 0957 02/18/23 1205 02/24/23 0822 02/24/23 1032  GLUCAP 445* 352* 320* 311*   Lipid Profile: No results for input(s): "CHOL", "HDL", "LDLCALC", "TRIG", "CHOLHDL", "LDLDIRECT" in the last 72 hours. Thyroid Function Tests: No results for input(s): "TSH", "T4TOTAL", "FREET4", "T3FREE", "THYROIDAB" in the last 72 hours. Anemia Panel: No results for input(s): "VITAMINB12", "FOLATE", "FERRITIN", "TIBC", "IRON", "RETICCTPCT" in the last 72 hours. Urine analysis:    Component Value Date/Time   COLORURINE YELLOW 03/02/2020 1530   APPEARANCEUR HAZY (A) 03/02/2020 1530   LABSPEC 1.025 03/02/2020 1530   PHURINE 5.0 03/02/2020 1530   GLUCOSEU >=500 (A) 03/02/2020 1530   HGBUR SMALL (A) 03/02/2020 1530   BILIRUBINUR NEGATIVE 03/02/2020 1530   BILIRUBINUR negative 08/10/2016 1116   KETONESUR 80 (A) 03/02/2020 1530    PROTEINUR 30 (A) 03/02/2020 1530   UROBILINOGEN 0.2 08/10/2016 1116   NITRITE NEGATIVE 03/02/2020 1530   LEUKOCYTESUR SMALL (A) 03/02/2020 1530    Radiological Exams on Admission: DG Foot Complete Right  Result Date: 02/24/2023 CLINICAL DATA:  Worsening right foot wound EXAM: RIGHT FOOT COMPLETE - 3 VIEW COMPARISON:  Right foot radiographs dated 02/18/2023 FINDINGS: Postsurgical changes from amputation of the great toe. Similar appearance of soft tissue wound at the amputation site. There is no evidence of fracture or dislocation. Similar cortical thickening of the second metatarsal shaft and widened second metatarsophalangeal joint space. No focal cortical lucency. Degenerative changes of the foot. Diffuse soft tissue edema of the forefoot. Soft tissues are unremarkable. IMPRESSION: 1. Postsurgical changes from amputation of the great toe. Similar appearance of soft tissue wound at the amputation site. 2. Similar cortical thickening of the second metatarsal shaft and widened second metatarsophalangeal joint space. No focal cortical lucency to suggest osteomyelitis. Electronically Signed   By: Agustin Cree M.D.   On: 02/24/2023 14:11    EKG: Independently reviewed.  Sinus, no acute ST changes.  Assessment/Plan Principal Problem:   Osteomyelitis (HCC)  (please populate well all problems here in Problem List. (For example, if patient is on BP meds at home and you resume or decide to hold them, it is a problem that needs to be her. Same for CAD, COPD, HLD and so on)  Right foot diabetic ulcer infection, outpatient management -Clinically suspect underlying osteomyelitis and septic arthritis and deep tissue infection -Podiatry Dr. Lajoyce Corners consulted and will see the patient tomorrow -MRI of right foot is ordered -Continue broad-spectrum antibiotics of vancomycin, cefepime and Flagyl -Given the extensiveness of infection, risk of losing right foot is high, explained to the patient. -DVT  study  Chronic left foot osteomyelitis -MRI pending -Antibiotics as above  History of PVD -ABI ordered  IDDM with hyperglycemia -Significant insulin resistance -Continue Lantus -Add SSI -Hold off metformin  DVT prophylaxis: Lovenox Code Status: Full code Family Communication: None at bedside Disposition Plan: Patient sick with significant right foot diabetic ulcer infection failed outpatient management, requiring IV antibiotics and inpatient podiatry consult, expect more than 2 midnight hospital stay Consults called: Podiatry Dr. Lajoyce Corners Admission status: MedSurg admission   Emeline General MD [facility] Pager [phone]  02/24/2023, 4:20 PM

## 2023-02-24 NOTE — Consult Note (Signed)
PHARMACY -  BRIEF ANTIBIOTIC NOTE   Pharmacy has received consult(s) for vancomycin and cefepime dosing from an ED provider.  The patient's profile has been reviewed for ht/wt/allergies/indication/available labs.    One time order(s) placed for cefepime 2 grams Iv x 1 and vancomycin 2000 mg IV x 1  Further antibiotics/pharmacy consults should be ordered by admitting physician if indicated.                       Thank you, Barrie Folk 02/24/2023  2:42 PM

## 2023-02-24 NOTE — Progress Notes (Addendum)
[name], [name] ([id number]) [id number] Page 1 of 2 Visit Report for 02/24/2023 HBO Details Patient Name: Date of Service: [name], [name] 02/24/2023 8:30 A M Medical Record Number: [id number] Patient Account Number: [id number] Date of Birth/Sex: Treating RN: [birth year] (42 y.o. M) Primary Care Rayley Gao: [name] Other Clinician: Referring Elanah Osmanovic: Treating Dakhari Zuver/Extender: [name], [name] in Treatment: 21 HBO Treatment Course Details Treatment Course Number: 1 Ordering Shadawn Hanaway: Geralyn Corwin T Treatments Ordered: otal 40 HBO Treatment Start 12/31/2022 Date: HBO Indication: Chronic Refractory Osteomyelitis to osteomyelitis left foot HBO Treatment End 02/24/2023 Date: HBO Discharge Treatment Series Incomplete; Medical Event- Outcome: Unrelated to HBO HBO Treatment Details Treatment Number: 35 Patient Type: Outpatient Chamber Type: Monoplace Chamber Serial #: [id number] Treatment Protocol: 2.0 ATA with 90 minutes oxygen, with two 5 minute air breaks Treatment Details Compression Rate Down: 2.0 psi / minute De-Compression Rate Up: 1.5 psi / minute A breaks and ir Compress Tx Pressure breathing periods Decompress Decompress Begins Reached (leave unused spaces Begins Ends blank) Chamber Pressure (ATA 1 2 2 2 2 2  --2 1 ) Clock Time (24 hr) 8:27 8:38 9:08 9:13 9:44 9:49 - - 10:20 10:28 Treatment Length: 121 (minutes) Treatment Segments: 4 Vital Signs Capillary Blood Glucose Reference Range: 80 - 120 mg / dl HBO Diabetic Blood Glucose Intervention Range: <131 mg/dl or >629 mg/dl Time Vitals Blood Respiratory Capillary Blood Glucose Pulse Action Type: Pulse: Temperature: Taken: Pressure: Rate: Glucose (mg/dl): Meter #: Oximetry (%) Taken: Pre 08:20 118/80 98 16 98 320 99 Post 10:28 124/78 92 16 97.6 311 1 98 Treatment Response Treatment Toleration: Well Treatment Completion Status: Treatment Completed  without Adverse Event Chelle Cayton Notes No concerns with treatment given. The patient was also seen for wound care evaluation see separate dictation. Wound worsened ray amputation completed. HBO Attestation I certify that I supervised this HBO treatment in accordance with Medicare guidelines. A trained emergency response team is readily available per Yes hospital policies and procedures. Continue HBOT as ordered. Yes Electronic Signature(s) Signed: 03/12/2023 10:17:54 AM By: Elliot Gurney, BSN, RN, CWS, Kim RN, BSN Signed: 03/21/2023 11:21:49 AM By: Baltazar Najjar MD [name], [name] ([id number]) By: Baltazar Najjar MD [phone].pdf Page 2 of 2 Signed: 03/21/2023 11:21:49 AM Previous Signature: 02/24/2023 3:47:50 PM Version By: Baltazar Najjar MD Previous Signature: 02/24/2023 11:12:27 AM Version By: Demetria Pore Entered By: Elliot Gurney BSN, RN, CWS, Kim on 03/12/2023 07:17:53 -------------------------------------------------------------------------------- HBO Safety Checklist Details Patient Name: Date of Service: [name] 02/24/2023 8:30 A M Medical Record Number: [id number] Patient Account Number: [id number] Date of Birth/Sex: Treating RN: 1980-12-16 (42 y.o. M) Primary Care Threasa Kinch: [name] Other Clinician: Referring Corion Sherrod: Treating Zachery Niswander/Extender: [name], [name] in Treatment: 21 HBO Safety Checklist Items Safety Checklist Consent Form Signed Patient voided / foley secured and emptied When did you last eato 02/24/2023 Last dose of injectable or oral agent 02/24/2023 Ostomy pouch emptied and vented if applicable NA All implantable devices assessed, documented and approved NA Intravenous access site secured and place NA Valuables secured Linens and cotton and cotton/polyester blend (less than 51% polyester) Personal oil-based products / skin lotions / body lotions removed Wigs or hairpieces removed Smoking or  tobacco materials removed NA Books / newspapers / magazines / loose paper removed Cologne, aftershave, perfume and deodorant removed Jewelry removed (may wrap wedding band) Make-up removed NA Hair care products removed Battery operated devices (external) removed NA Heating patches and chemical warmers removed NA Titanium  eyewear removed NA Nail polish cured greater than 10 hours NA Casting material cured greater than 10 hours NA Hearing aids removed NA Loose dentures or partials removed NA Prosthetics have been removed NA Patient demonstrates correct use of air break device (if applicable) Patient concerns have been addressed Patient grounding bracelet on and cord attached to chamber Specifics for Inpatients (complete in addition to above) Medication sheet sent with patient NA Intravenous medications needed or due during therapy sent with patient NA Drainage tubes (e.g. nasogastric tube or chest tube secured and vented) NA Endotracheal or Tracheotomy tube secured NA Cuff deflated of air and inflated with saline NA Airway suctioned NA Electronic Signature(s) Signed: 02/24/2023 11:12:27 AM By: Demetria Pore Entered By: Demetria Pore on 02/24/2023 08:07:34

## 2023-02-24 NOTE — Consult Note (Signed)
Pharmacy Antibiotic Note  [name] is a 42 y.o. male admitted on 02/24/2023 with wound infection with concern for osteomyelitis.  Pharmacy has been consulted for cefepime and vancomycin dosing.  Plan: Give vancomycin 2500 mg IV x 1, then start 2000 mg IV every 12 hours Estimated AUC 486, Cmin 11.9 IBW, Scr 0.82, Vd 0.5 (BMI 42) Start cefepime 2 grams IV every 8 hours Vancomycin levels at steady state or as clinically indicated Follow renal function and cultures for adjustments  Height: 6\' 1"  (185.4 cm) Weight: (!) 145.2 kg (320 lb) IBW/kg (Calculated) : 79.9  Temp (24hrs), Avg:97.7 F (36.5 C), Min:97.7 F (36.5 C), Max:97.7 F (36.5 C)  Recent Labs  Lab 02/24/23 1244 02/24/23 1458  WBC 13.7*  --   CREATININE 0.82  --   LATICACIDVEN 2.4* 1.6    Estimated Creatinine Clearance: 174.2 mL/min (by C-G formula based on SCr of 0.82 mg/dL).    Allergies  Allergen Reactions   Milk-Related Compounds Diarrhea    Antimicrobials this admission: vancomycin 10/2 >>  cefepime 10/2 >>  Flagyl 10/2 >>  Dose adjustments this admission: N/A  Microbiology results: 10/2 BCx: pending   Thank you for allowing pharmacy to be a part of this patient's care.  Barrie Folk, PharmD 02/24/2023 4:32 PM

## 2023-02-24 NOTE — Consult Note (Addendum)
PODIATRY / FOOT AND ANKLE SURGERY CONSULTATION NOTE  Requesting Physician: Dr. Chipper Herb  Reason for consult: Foot ulcers both feet, infection right foot   HPI: [name] is a 42 y.o. male who presents with ulcerations to both feet.  Patient has a history of diabetes with polyneuropathy.  He has had previous amputations performed by Dr. Lajoyce Corners in [place].  He does see the wound care center for chronic wounds present to both feet.  He has been also on chronic antibiotics and has been treated with local wound care as well as hyperbaric oxygen therapy.  He was seen today and noted to have worsening signs of infection so was sent to the emergency room.  He believes that the right foot is worse than the left.  Patient currently denies nausea, vomiting, fevers, chills.  PMHx:  Past Medical History:  Diagnosis Date   ADHD    Anxiety    Bipolar disorder (HCC)    Depression    Diabetes mellitus without complication (HCC)    type 2   Heart murmur    as a child   HLD (hyperlipidemia)    Hypertension    no meds   Peripheral vascular disease (HCC)     Surgical Hx:  Past Surgical History:  Procedure Laterality Date   AMPUTATION Left 03/06/2020   Procedure: LEFT GREAT TOE AMPUTATION;  Surgeon: [name], MD;  Location: [facility] OR;  Service: Orthopedics;  Laterality: Left;   AMPUTATION Right 10/15/2021   Procedure: RIGHT GREAT TOE AMPUTATION;  Surgeon: [name], MD;  Location: [facility] OR;  Service: Orthopedics;  Laterality: Right;   EYE SURGERY Bilateral    lasik   WISDOM TOOTH EXTRACTION      FHx:  Family History  Problem Relation Age of Onset   Diabetes Mother    Hyperlipidemia Father    Hypertension Father     Social History:  reports that he has never smoked. He has never used smokeless tobacco. He reports that he does not currently use alcohol. He reports that he does not use drugs.  Allergies:  Allergies  Allergen Reactions   Milk-Related Compounds Diarrhea   (Not in  a hospital admission)   Physical Exam: General: Alert and oriented.  No apparent distress.  Vascular: DP/PT pulses palpable, capillary fill time intact to digits bilaterally.  Hair growth present to bilateral lower extremities.  Neuro: Light touch sensation reduced to nearly absent to bilateral lower extremities.  Derm: Ulceration present to the plantar aspect of the first metatarsal head right foot, measures approximately 0.3 x 1 cm and probes deep to bone, slightly more dorsal than this there is a open ulceration as well that measures approximately 0.5 x 0.5 cm and probes deep to bone approximately 2 cm at the first metatarsal head.  Seropurulent discharge present to the areas with associated erythema and edema present at this spot extending to the midfoot.    Open ulcerations present also to the left foot including the plantar first metatarsal head which measures approximately 0.5 x 0.5 x 0.2 cm cm, fourth metatarsal phalange joint plantar which measures approximately 0.5 x 1 cm -dermal tissue, fifth metatarsal phalange joint lateral which measures 1 cm x 0.2 cm x 0.5 cm, no bone exposed to these areas and no corresponding erythema or edema, minimal serous drainage, no signs of infection to the left foot.  MSK: Hallux amputations bilaterally  Results for orders placed or performed during the hospital encounter of 02/24/23 (from the past  48 hour(s))  Lactic acid, plasma     Status: Abnormal   Collection Time: 02/24/23 12:44 PM  Result Value Ref Range   Lactic Acid, Venous 2.4 (HH) 0.5 - 1.9 mmol/L    Comment: CRITICAL RESULT CALLED TO, READ BACK BY AND VERIFIED WITH HEATHER FISHER 02/24/23 1355 MU Performed at [facility], [address] [place], [place] [address]   Comprehensive metabolic panel     Status: Abnormal   Collection Time: 02/24/23 12:44 PM  Result Value Ref Range   Sodium 134 (L) 135 - 145 mmol/L   Potassium 4.1 3.5 - 5.1 mmol/L   Chloride 97 (L) 98 - 111  mmol/L   CO2 25 22 - 32 mmol/L   Glucose, Bld 345 (H) 70 - 99 mg/dL    Comment: Glucose reference range applies only to samples taken after fasting for at least 8 hours.   BUN 14 6 - 20 mg/dL   Creatinine, Ser 6.04 0.61 - 1.24 mg/dL   Calcium 9.2 8.9 - 54.0 mg/dL   Total Protein 9.2 (H) 6.5 - 8.1 g/dL   Albumin 4.3 3.5 - 5.0 g/dL   AST 13 (L) 15 - 41 U/L   ALT 15 0 - 44 U/L   Alkaline Phosphatase 95 38 - 126 U/L   Total Bilirubin 1.7 (H) 0.3 - 1.2 mg/dL   GFR, Estimated >98 >11 mL/min    Comment: (NOTE) Calculated using the CKD-EPI Creatinine Equation (2021)    Anion gap 12 5 - 15    Comment: Performed at [facility], [address] [place], [place] [address]  CBC with Differential     Status: Abnormal   Collection Time: 02/24/23 12:44 PM  Result Value Ref Range   WBC 13.7 (H) 4.0 - 10.5 K/uL   RBC 5.62 4.22 - 5.81 MIL/uL   Hemoglobin 15.6 13.0 - 17.0 g/dL   HCT 29.5 62.1 - 30.8 %   MCV 81.7 80.0 - 100.0 fL   MCH 27.8 26.0 - 34.0 pg   MCHC 34.0 30.0 - 36.0 g/dL   RDW 65.7 84.6 - 96.2 %   Platelets 341 150 - 400 K/uL   nRBC 0.0 0.0 - 0.2 %   Neutrophils Relative % 67 %   Neutro Abs 9.2 (H) 1.7 - 7.7 K/uL   Lymphocytes Relative 22 %   Lymphs Abs 3.0 0.7 - 4.0 K/uL   Monocytes Relative 8 %   Monocytes Absolute 1.1 (H) 0.1 - 1.0 K/uL   Eosinophils Relative 2 %   Eosinophils Absolute 0.3 0.0 - 0.5 K/uL   Basophils Relative 1 %   Basophils Absolute 0.1 0.0 - 0.1 K/uL   Immature Granulocytes 0 %   Abs Immature Granulocytes 0.06 0.00 - 0.07 K/uL    Comment: Performed at [facility], [address] [place], [place] [address]  Lactic acid, plasma     Status: None   Collection Time: 02/24/23  2:58 PM  Result Value Ref Range   Lactic Acid, Venous 1.6 0.5 - 1.9 mmol/L    Comment: Performed at [facility], [address] [place], [place] [address]   DG Foot Complete Right  Result Date: 02/24/2023 CLINICAL DATA:  Worsening right foot wound  EXAM: RIGHT FOOT COMPLETE - 3 VIEW COMPARISON:  Right foot radiographs dated 02/18/2023 FINDINGS: Postsurgical changes from amputation of the great toe. Similar appearance of soft tissue wound at the amputation site. There is no evidence of fracture or dislocation. Similar cortical thickening  of the second metatarsal shaft and widened second metatarsophalangeal joint space. No focal cortical lucency. Degenerative changes of the foot. Diffuse soft tissue edema of the forefoot. Soft tissues are unremarkable. IMPRESSION: 1. Postsurgical changes from amputation of the great toe. Similar appearance of soft tissue wound at the amputation site. 2. Similar cortical thickening of the second metatarsal shaft and widened second metatarsophalangeal joint space. No focal cortical lucency to suggest osteomyelitis. Electronically Signed   By: Agustin Cree M.D.   On: 02/24/2023 14:11    Blood pressure (!) 111/90, pulse (!) 106, temperature 97.7 F (36.5 C), temperature source Oral, resp. rate 17, height 6\' 1"  (1.854 m), weight (!) 145.2 kg, SpO2 100%.   Assessment Osteomyelitis right first metatarsal with associated diabetic foot ulceration and cellulitis Stable osteomyelitis of the left 5th MTPJ Multiple diabetic foot ulcerations to the left foot including plantar first metatarsal, fourth metatarsal phalange joint, and lateral fifth metatarsal phalangeal joint Diabetes type 2 with polyneuropathy, uncontrolled  Plan -Patient seen and examined. -X-ray imaging reviewed. Concern for osteomyelitis of the left 5th MTPJ and 1st met head.  MRI is pending to both feet. -ABIs have been ordered.  Believe that patient circulation is adequate as I can feel his pulses today and he does have feet that are warm to touch.  Still would like to get these as a baseline. -Wound debridements performed as described below to all wounds, patient tolerated procedure well.  All were 100% excisional.  Appears to have bone exposed at the first  metatarsal head area to the right foot with abscess and cellulitis. -Discussed with patient he would likely need a partial first ray amputation of the right side with excision of wound and closure.  Could consider also placing antibiotic beads. -Patient will be placed n.p.o. at midnight for surgical intervention tomorrow.  Will plan on performing sometime around noon. All treatment options were discussed with the patient of both conservative and surgical attempts at correction including potential risks and complications.  Patient has elected for procedure consisting of right partial first ray amputation and partial left 5th ray amputation with application of antibiotic beads.  No guarantees given.  Consent obtained. -Patient is at high risk for limb loss due to multiple ulcerations, previous history of amputation, and current infection present.  Debridment of ulcer: Location: Right first metatarsal plantar Pre-debridement measurement: 0.3 x 1 cm x 1 cm: 0.5 x 0.5 x 2 cm Post-debridement measurement: Same Tissue removed:   Hyperkeratotic tissue, fibrous tissue Ulcer was debrided sharply with combination of tissue nippers and scalpel blade into the subcutaneous tissue  Debridment of ulcer: Location: Left plantar first metatarsal Pre-debridement measurement: 0.5 x 0.5 x 0.2 cm Post-debridement measurement: Same Tissue removed:   Hyperkeratotic tissue, biofilm Ulcer was debrided sharply with combination of tissue nippers and scalpel blade into the subcutaneous tissue  Debridment of ulcer: Location: Left plantar fourth metatarsal phalangeal joint Pre-debridement measurement: 0.5 x 0.1 cm Post-debridement measurement: Same Tissue removed:   Hyperkeratotic tissue, biofilm Ulcer was debrided sharply with combination of tissue nippers and scalpel blade into the dermis  Debridment of ulcer: Location: Left lateral fifth metatarsal phalange joint Pre-debridement measurement: 1 cm x 0.2 cm x 0.5  cm Post-debridement measurement: Same Tissue removed:   Hyperkeratotic tissue, biofilm Ulcer was debrided sharply with combination of tissue nippers and scalpel blade into the subcutaneous tissue  Rosetta Posner, DPM 02/24/2023, 4:36 PM

## 2023-02-25 ENCOUNTER — Encounter: Payer: 59 | Admitting: Physician Assistant

## 2023-02-25 ENCOUNTER — Inpatient Hospital Stay: Payer: 59 | Admitting: Certified Registered"

## 2023-02-25 ENCOUNTER — Other Ambulatory Visit: Payer: Self-pay

## 2023-02-25 ENCOUNTER — Inpatient Hospital Stay: Payer: 59

## 2023-02-25 ENCOUNTER — Encounter
Admission: EM | Disposition: A | Discharge status: Home / Self Care | Payer: Self-pay | Source: Ambulatory Visit | Attending: Osteopathic Medicine

## 2023-02-25 ENCOUNTER — Encounter: Payer: Self-pay | Admitting: Internal Medicine

## 2023-02-25 DIAGNOSIS — M86171 Other acute osteomyelitis, right ankle and foot: Secondary | ICD-10-CM

## 2023-02-25 DIAGNOSIS — E1151 Type 2 diabetes mellitus with diabetic peripheral angiopathy without gangrene: Secondary | ICD-10-CM

## 2023-02-25 DIAGNOSIS — T8743 Infection of amputation stump, right lower extremity: Secondary | ICD-10-CM

## 2023-02-25 DIAGNOSIS — E1142 Type 2 diabetes mellitus with diabetic polyneuropathy: Secondary | ICD-10-CM

## 2023-02-25 DIAGNOSIS — Z794 Long term (current) use of insulin: Secondary | ICD-10-CM

## 2023-02-25 DIAGNOSIS — M86271 Subacute osteomyelitis, right ankle and foot: Secondary | ICD-10-CM | POA: Diagnosis not present

## 2023-02-25 DIAGNOSIS — E1169 Type 2 diabetes mellitus with other specified complication: Principal | ICD-10-CM

## 2023-02-25 HISTORY — PX: BONE BIOPSY: SHX375

## 2023-02-25 HISTORY — PX: TRANSMETATARSAL AMPUTATION: SHX6197

## 2023-02-25 LAB — BASIC METABOLIC PANEL
Anion gap: 7 (ref 5–15)
BUN: 13 mg/dL (ref 6–20)
CO2: 25 mmol/L (ref 22–32)
Calcium: 8.5 mg/dL — ABNORMAL LOW (ref 8.9–10.3)
Chloride: 104 mmol/L (ref 98–111)
Creatinine, Ser: 0.68 mg/dL (ref 0.61–1.24)
GFR, Estimated: >60 mL/min (ref >60)
Glucose, Bld: 204 mg/dL — ABNORMAL HIGH (ref 70–99)
Potassium: 4.2 mmol/L (ref 3.5–5.1)
Sodium: 136 mmol/L (ref 135–145)

## 2023-02-25 LAB — CBC
HCT: 40.6 % (ref 39.0–52.0)
Hemoglobin: 13.6 g/dL (ref 13.0–17.0)
MCH: 27.3 pg (ref 26.0–34.0)
MCHC: 33.5 g/dL (ref 30.0–36.0)
MCV: 81.5 fL (ref 80.0–100.0)
Platelets: 290 10*3/uL (ref 150–400)
RBC: 4.98 MIL/uL (ref 4.22–5.81)
RDW: 13.2 % (ref 11.5–15.5)
WBC: 11.3 10*3/uL — ABNORMAL HIGH (ref 4.0–10.5)
nRBC: 0 % (ref 0.0–0.2)

## 2023-02-25 LAB — GLUCOSE, CAPILLARY
Glucose-Capillary: 189 mg/dL — ABNORMAL HIGH (ref 70–99)
Glucose-Capillary: 179 mg/dL — ABNORMAL HIGH (ref 70–99)
Glucose-Capillary: 185 mg/dL — ABNORMAL HIGH (ref 70–99)
Glucose-Capillary: 222 mg/dL — ABNORMAL HIGH (ref 70–99)

## 2023-02-25 LAB — SURGICAL PCR SCREEN
MRSA, PCR: NEGATIVE
Staphylococcus aureus: NEGATIVE

## 2023-02-25 SURGERY — AMPUTATION, FOOT, TRANSMETATARSAL
Anesthesia: General | Site: First Toe | Laterality: Right

## 2023-02-25 MED ORDER — FENTANYL CITRATE (PF) 100 MCG/2ML IJ SOLN
INTRAMUSCULAR | Status: DC | PRN
  Administered 2023-02-25 (×2): 25 ug via INTRAVENOUS
  Administered 2023-02-25: 50 ug via INTRAVENOUS

## 2023-02-25 MED ORDER — LACTATED RINGERS IV SOLN: INTRAVENOUS | Status: DC

## 2023-02-25 MED ORDER — SEVOFLURANE IN SOLN
RESPIRATORY_TRACT | Status: AC
  Filled 2023-02-25: qty 250

## 2023-02-25 MED ORDER — PHENYLEPHRINE 80 MCG/ML (10ML) SYRINGE FOR IV PUSH (FOR BLOOD PRESSURE SUPPORT)
PREFILLED_SYRINGE | INTRAVENOUS | Status: DC | PRN
  Administered 2023-02-25: 80 ug via INTRAVENOUS
  Administered 2023-02-25 (×2): 160 ug via INTRAVENOUS
  Administered 2023-02-25 (×2): 80 ug via INTRAVENOUS

## 2023-02-25 MED ORDER — FENTANYL CITRATE (PF) 100 MCG/2ML IJ SOLN
INTRAMUSCULAR | Status: AC
  Filled 2023-02-25: qty 2

## 2023-02-25 MED ORDER — ACETAMINOPHEN 10 MG/ML IV SOLN: 1000.0000 mg | Freq: Once | INTRAVENOUS | Status: DC | PRN

## 2023-02-25 MED ORDER — ONDANSETRON HCL 4 MG/2ML IJ SOLN
INTRAMUSCULAR | Status: AC
  Filled 2023-02-25: qty 4

## 2023-02-25 MED ORDER — SODIUM CHLORIDE 0.9 % IR SOLN
Status: DC | PRN
Start: 2023-02-25 — End: 2023-02-25
  Administered 2023-02-25: 3000 mL

## 2023-02-25 MED ORDER — ONDANSETRON HCL 4 MG/2ML IJ SOLN
INTRAMUSCULAR | Status: DC | PRN
  Administered 2023-02-25: 4 mg via INTRAVENOUS

## 2023-02-25 MED ORDER — MIDAZOLAM HCL 2 MG/2ML IJ SOLN
INTRAMUSCULAR | Status: DC | PRN
  Administered 2023-02-25: 2 mg via INTRAVENOUS

## 2023-02-25 MED ORDER — CHLORHEXIDINE GLUCONATE 0.12 % MT SOLN
15.0000 mL | Freq: Once | OROMUCOSAL | Status: AC
  Administered 2023-02-25: 15 mL via OROMUCOSAL

## 2023-02-25 MED ORDER — PROPOFOL 10 MG/ML IV BOLUS
INTRAVENOUS | Status: AC
  Filled 2023-02-25: qty 20

## 2023-02-25 MED ORDER — BUPIVACAINE HCL (PF) 0.5 % IJ SOLN
INTRAMUSCULAR | Status: DC | PRN
Start: 2023-02-25 — End: 2023-02-25
  Administered 2023-02-25: 20 mL

## 2023-02-25 MED ORDER — SODIUM CHLORIDE 0.9 % IV SOLN
INTRAVENOUS | Status: DC | PRN
Start: 2023-02-25 — End: 2023-02-25

## 2023-02-25 MED ORDER — DEXAMETHASONE SODIUM PHOSPHATE 10 MG/ML IJ SOLN
INTRAMUSCULAR | Status: AC
  Filled 2023-02-25: qty 2

## 2023-02-25 MED ORDER — ONDANSETRON HCL 4 MG/2ML IJ SOLN: 4.0000 mg | Freq: Once | INTRAMUSCULAR | Status: DC | PRN

## 2023-02-25 MED ORDER — OXYCODONE HCL 5 MG/5ML PO SOLN: 5.0000 mg | Freq: Once | ORAL | Status: DC | PRN

## 2023-02-25 MED ORDER — BUPIVACAINE HCL (PF) 0.5 % IJ SOLN
INTRAMUSCULAR | Status: AC
  Filled 2023-02-25: qty 30

## 2023-02-25 MED ORDER — LIDOCAINE HCL (PF) 1 % IJ SOLN
INTRAMUSCULAR | Status: AC
  Filled 2023-02-25: qty 30

## 2023-02-25 MED ORDER — OXYCODONE HCL 5 MG PO TABS: 5.0000 mg | ORAL_TABLET | Freq: Once | ORAL | Status: DC | PRN

## 2023-02-25 MED ORDER — PHENYLEPHRINE 80 MCG/ML (10ML) SYRINGE FOR IV PUSH (FOR BLOOD PRESSURE SUPPORT)
PREFILLED_SYRINGE | INTRAVENOUS | Status: AC
  Filled 2023-02-25: qty 10

## 2023-02-25 MED ORDER — EPHEDRINE SULFATE (PRESSORS) 50 MG/ML IJ SOLN
INTRAMUSCULAR | Status: DC | PRN
  Administered 2023-02-25 (×3): 5 mg via INTRAVENOUS

## 2023-02-25 MED ORDER — PROPOFOL 10 MG/ML IV BOLUS
INTRAVENOUS | Status: DC | PRN
  Administered 2023-02-25: 50 mg via INTRAVENOUS
  Administered 2023-02-25: 250 mg via INTRAVENOUS

## 2023-02-25 MED ORDER — DEXAMETHASONE SODIUM PHOSPHATE 10 MG/ML IJ SOLN
INTRAMUSCULAR | Status: DC | PRN
  Administered 2023-02-25: 5 mg via INTRAVENOUS

## 2023-02-25 MED ORDER — FENTANYL CITRATE (PF) 100 MCG/2ML IJ SOLN: 25.0000 ug | INTRAMUSCULAR | Status: DC | PRN

## 2023-02-25 MED ORDER — VANCOMYCIN HCL 1000 MG IV SOLR
INTRAVENOUS | Status: AC
  Filled 2023-02-25: qty 20

## 2023-02-25 MED ORDER — EPHEDRINE 5 MG/ML INJ
INTRAVENOUS | Status: AC
  Filled 2023-02-25: qty 5

## 2023-02-25 MED ORDER — VANCOMYCIN HCL 1000 MG IV SOLR
INTRAVENOUS | Status: DC | PRN
Start: 2023-02-25 — End: 2023-02-25
  Administered 2023-02-25: 1000 mg

## 2023-02-25 MED ORDER — CHLORHEXIDINE GLUCONATE 0.12 % MT SOLN
OROMUCOSAL | Status: AC
  Filled 2023-02-25: qty 15

## 2023-02-25 MED ORDER — MIDAZOLAM HCL 2 MG/2ML IJ SOLN
INTRAMUSCULAR | Status: AC
  Filled 2023-02-25: qty 2

## 2023-02-25 MED ORDER — LIDOCAINE HCL (CARDIAC) PF 100 MG/5ML IV SOSY
PREFILLED_SYRINGE | INTRAVENOUS | Status: DC | PRN
  Administered 2023-02-25: 100 mg via INTRAVENOUS

## 2023-02-25 MED ORDER — 0.9 % SODIUM CHLORIDE (POUR BTL) OPTIME
TOPICAL | Status: DC | PRN
  Administered 2023-02-25: 500 mL

## 2023-02-25 SURGICAL SUPPLY — 70 items
BAG COUNTER SPONGE SURGICOUNT (BAG) IMPLANT
BAG SPNG CNTER NS LX DISP (BAG)
BLADE OSC/SAGITTAL MD 9X18.5 (BLADE) ×2 IMPLANT
BLADE OSCILLATING/SAGITTAL (BLADE) ×2
BLADE SURG 15 STRL LF DISP TIS (BLADE) ×4 IMPLANT
BLADE SURG 15 STRL SS (BLADE) ×4
BLADE SW THK.38XMED LNG THN (BLADE) ×2 IMPLANT
BNDG CMPR 5X4 CHSV STRCH STRL (GAUZE/BANDAGES/DRESSINGS) ×2
BNDG CMPR 5X4 KNIT ELC UNQ LF (GAUZE/BANDAGES/DRESSINGS) ×2
BNDG CMPR 6 X 5 YARDS HK CLSR (GAUZE/BANDAGES/DRESSINGS) ×2
BNDG CMPR 75X41 PLY HI ABS (GAUZE/BANDAGES/DRESSINGS) ×2
BNDG COHESIVE 4X5 TAN STRL LF (GAUZE/BANDAGES/DRESSINGS) ×2 IMPLANT
BNDG ELASTIC 4INX 5YD STR LF (GAUZE/BANDAGES/DRESSINGS) ×2 IMPLANT
BNDG ELASTIC 6INX 5YD STR LF (GAUZE/BANDAGES/DRESSINGS) ×2 IMPLANT
BNDG ESMARCH 4 X 12 STRL LF (GAUZE/BANDAGES/DRESSINGS) ×2
BNDG ESMARCH 4X12 STRL LF (GAUZE/BANDAGES/DRESSINGS) ×2 IMPLANT
BNDG GAUZE DERMACEA FLUFF 4 (GAUZE/BANDAGES/DRESSINGS) ×4 IMPLANT
BNDG GZE DERMACEA 4 6PLY (GAUZE/BANDAGES/DRESSINGS) ×4
BNDG STRETCH 4X75 STRL LF (GAUZE/BANDAGES/DRESSINGS) ×2 IMPLANT
CNTNR URN SCR LID CUP LEK RST (MISCELLANEOUS) IMPLANT
CONT SPEC 4OZ STRL OR WHT (MISCELLANEOUS) ×6
CUFF TOURN SGL QUICK 12 (TOURNIQUET CUFF) ×2 IMPLANT
CUFF TOURN SGL QUICK 18X4 (TOURNIQUET CUFF) ×2 IMPLANT
DRAIN PENROSE 12X.25 LTX STRL (MISCELLANEOUS) IMPLANT
DRAPE FLUOR MINI C-ARM 54X84 (DRAPES) ×2 IMPLANT
DRSG MEPILEX FLEX 3X3 (GAUZE/BANDAGES/DRESSINGS) IMPLANT
DURAPREP 26ML APPLICATOR (WOUND CARE) ×2 IMPLANT
ELECT REM PT RETURN 9FT ADLT (ELECTROSURGICAL) ×2
ELECTRODE REM PT RTRN 9FT ADLT (ELECTROSURGICAL) ×2 IMPLANT
GAUZE SPONGE 4X4 12PLY STRL (GAUZE/BANDAGES/DRESSINGS) ×2 IMPLANT
GAUZE XEROFORM 1X8 LF (GAUZE/BANDAGES/DRESSINGS) ×4 IMPLANT
GLOVE BIO SURGEON STRL SZ7 (GLOVE) ×2 IMPLANT
GLOVE INDICATOR 7.0 STRL GRN (GLOVE) ×2 IMPLANT
GLOVE INDICATOR 7.5 STRL GRN (GLOVE) ×2 IMPLANT
GOWN STRL REUS W/ TWL LRG LVL3 (GOWN DISPOSABLE) ×4 IMPLANT
GOWN STRL REUS W/TWL LRG LVL3 (GOWN DISPOSABLE) ×4
HANDLE YANKAUER SUCT BULB TIP (MISCELLANEOUS) IMPLANT
HANDPIECE VERSAJET DEBRIDEMENT (MISCELLANEOUS) IMPLANT
KIT STIMULAN RAPID CURE 5CC (Orthopedic Implant) IMPLANT
KIT TURNOVER KIT A (KITS) ×2 IMPLANT
MANIFOLD NEPTUNE II (INSTRUMENTS) ×2 IMPLANT
NDL BIOPSY JAMSHIDI 11X6 (NEEDLE) IMPLANT
NDL HYPO 25X1 1.5 SAFETY (NEEDLE) ×4 IMPLANT
NDL SAFETY ECLIP 18X1.5 (MISCELLANEOUS) ×2 IMPLANT
NEEDLE BIOPSY JAMSHIDI 11X6 (NEEDLE) ×2 IMPLANT
NEEDLE HYPO 25X1 1.5 SAFETY (NEEDLE) ×4 IMPLANT
NS IRRIG 500ML POUR BTL (IV SOLUTION) ×2 IMPLANT
PACK EXTREMITY ARMC (MISCELLANEOUS) ×2 IMPLANT
PAD ABD DERMACEA PRESS 5X9 (GAUZE/BANDAGES/DRESSINGS) IMPLANT
PULSAVAC PLUS IRRIG FAN TIP (DISPOSABLE) ×2
SOL PREP PVP 2OZ (MISCELLANEOUS) ×2
SOLUTION PREP PVP 2OZ (MISCELLANEOUS) ×2 IMPLANT
SPONGE T-LAP 18X18 ~~LOC~~+RFID (SPONGE) ×2 IMPLANT
STAPLER SKIN PROX 35W (STAPLE) ×2 IMPLANT
STOCKINETTE M/LG 89821 (MISCELLANEOUS) ×2 IMPLANT
STRIP CLOSURE SKIN 1/4X4 (GAUZE/BANDAGES/DRESSINGS) ×2 IMPLANT
SUT ETHILON 2 0 FS 18 (SUTURE) ×4 IMPLANT
SUT ETHILON 2 0 FSLX (SUTURE) ×2 IMPLANT
SUT ETHILON 3-0 FS-10 30 BLK (SUTURE) ×4
SUT VIC AB 2-0 CT1 27 (SUTURE)
SUT VIC AB 2-0 CT1 TAPERPNT 27 (SUTURE) ×2 IMPLANT
SUT VIC AB 2-0 SH 27 (SUTURE)
SUT VIC AB 2-0 SH 27XBRD (SUTURE) ×2 IMPLANT
SUT VIC AB 3-0 SH 27 (SUTURE) ×2
SUT VIC AB 3-0 SH 27X BRD (SUTURE) ×4 IMPLANT
SUTURE EHLN 3-0 FS-10 30 BLK (SUTURE) ×4 IMPLANT
SYR 10ML LL (SYRINGE) ×4 IMPLANT
TIP FAN IRRIG PULSAVAC PLUS (DISPOSABLE) IMPLANT
TRAP FLUID SMOKE EVACUATOR (MISCELLANEOUS) ×2 IMPLANT
WATER STERILE IRR 500ML POUR (IV SOLUTION) ×2 IMPLANT

## 2023-02-25 NOTE — Anesthesia Postprocedure Evaluation (Signed)
Anesthesia Post Note  Patient: [name]  Procedure(s) Performed: RIGHT PARTIAL FIRST RAY AMPUTATION WITH ANTIBIOTIC BEAD APPLICATION (Right: First Toe) LEFT FIFTH METATARSAL BONE BIOPSY (Left: Fifth Toe)  Patient location during evaluation: PACU Anesthesia Type: General Level of consciousness: awake and alert, oriented and patient cooperative Pain management: pain level controlled Vital Signs Assessment: post-procedure vital signs reviewed and stable Respiratory status: spontaneous breathing, nonlabored ventilation and respiratory function stable Cardiovascular status: blood pressure returned to baseline and stable Postop Assessment: adequate PO intake Anesthetic complications: no   No notable events documented.   Last Vitals:  Vitals:   02/25/23 1400 02/25/23 1415  BP: 97/68 114/70  Pulse: 86 90  Resp: 19 16  Temp:  (!) 36.3 C  SpO2: 97% 99%    Last Pain:  Vitals:   02/25/23 1415  TempSrc:   PainSc: 0-No pain                 Reed Breech

## 2023-02-25 NOTE — Op Note (Signed)
PODIATRY / FOOT AND ANKLE SURGERY OPERATIVE REPORT    SURGEON: Rosetta Posner, DPM  PRE-OPERATIVE DIAGNOSIS:  1.  Acute osteomyelitis right first ray with abscess and associated diabetic foot ulceration with cellulitis 2.  Left fifth metatarsal phalange joint chronic osteomyelitis, stable 3.  Diabetes type 2 polyneuropathy 4.  Multiple diabetic foot ulcerations both feet  POST-OPERATIVE DIAGNOSIS: Same  PROCEDURE(S): Right partial first ray amputation with antibiotic bead application Left fifth metatarsal phalangeal joint bone biopsy  HEMOSTASIS: Right ankle tourniquet  ANESTHESIA: general  ESTIMATED BLOOD LOSS: 10 cc  FINDING(S): 1.  Abscess present right distal first metatarsal with osteomyelitis first metatarsal head 2.  Soft bone present at the left fifth metatarsal phalange joint consistent with likely osteomyelitis  PATHOLOGY/SPECIMEN(S):  1.  Right foot wound culture 2.  Right foot first metatarsal bone pathology specimen with proximal margin purple ink 3.  Right foot first metatarsal close and culture 4.  Left foot fifth metatarsal phalangeal joint bone culture 5.  Left foot fifth metatarsal phalange joint bone path specimen  INDICATIONS:   [name] is a 42 y.o. male who presents with nonhealing ulcerations to both feet.  Patient has previously had amputations of both big toes in the past several years ago.  He has ultimately had more diabetic foot ulceration since that time and has been going to the wound care center for treatment involving local wound care with HBO therapy and antibiotics.  He was noticed to have worsening infection to the right foot so he was sent to the hospital for admission.  Patient had MRIs performed both feet as well as x-ray imaging which revealed osteomyelitis to the first metatarsal right foot with abscess likely present and chronic osteomyelitis present to the fifth ray of the left foot.  All treatment options were discussed with the  patient both conservative and surgical attempts at correction including potential risks and complications and at this time patient is elected for surgical intervention described above.  No guarantees given.  Patient remains at high risk for limb loss.  Consent obtained prior to procedure..  DESCRIPTION: After obtaining full informed written consent, the patient was brought back to the operating room and placed supine upon the operating table.  The patient received IV antibiotics prior to induction.  After obtaining adequate anesthesia, the patient was prepped and draped in the standard fashion.  15 cc of half percent Marcaine plain was injected about the right first ray in a Mayo type block and 5 cc of half percent Marcaine plain was injected about the left fifth ray in the area of the biopsy site near the fifth metatarsal phalange joint laterally near the wound.  Attention was directed to the left lateral foot at the fifth metatarsal phalange joint level where the area of the wound was.  At this time a Jamshidi needle was introduced through the wound and onto the bone.  This was then driven into the bone.  The Jamshidi needle was removed and bone specimens were obtained.  The bone specimen was divided in half and 1 specimen was sent off for culture left fifth metatarsal and the other was sent for path specimen left fifth metatarsal.  The bone appeared to be fairly soft at this level.  Attention was then directed to the right foot.  An Esmarch bandage was used to exsanguinate the right lower extremity and the pneumatic ankle tourniquet was inflated.  A 3-1 type of elliptical incision was made ellipsing the wounds both dorsally and  plantarly at the first metatarsal head area.  The incision was extended proximally and plantarly for exposure purposes.  As soon as the incision was made a large amount of purulence was able to be expressed from the area.  A wound culture was taken and sent off.  The skin was  ellipsed and passed off in the operative site.  The incision was made such that was made straight into the bone at the first metatarsal.  The periosteum and capsular tissue was reflected medially and laterally by exposing the first metatarsal head at the operative site.  A sagittal bone saw was then used to resect the first metatarsal head at the level of the neck and distal shaft with the appropriate beveling.  The distal aspect of the first metatarsal head appeared to be soft and appeared to have very minimal cartilage intact consistent with likely osteomyelitis.  This bone piece was then colored with purple ink in the proximal margin and passed off the operative site as first metatarsal path specimen.  The sesamoids were also resected and sent off the same specimen.  There did not appear to be any further purulent drainage present to the area and the tissues appeared to be fairly healthy overall.  The surgical site was flushed with copious amounts normal sterile saline with pulse lavage 3 L.  A closing bone culture was then taken from the distal aspect the first metatarsal that remained and sent off.  The medullary canal appeared to be fairly soft within the first metatarsal itself but the cortex appeared to be very hard.  Antibiotic beads mixed with vancomycin were then packed into the medullary canal and throughout the soft tissues at the amputation site.  The deep structures and subcutaneous tissue was reapproximated well coapted with 3-0 Vicryl.  The skin was then reapproximated well coapted with 3-0 nylon in a combination of simple and horizontal mattress type stitching.  The pneumatic ankle tourniquet was deflated and a prompt hyperemic response noted to all digits of the right foot.  Postoperative dressings were then applied to both feet consisting of Xeroform to the wounds and to the incisions followed by 4 x 4 gauze, ABD, Kerlix, Ace wrap.  The patient tolerated the procedure and anesthesia well and  was transferred to recovery in vital signs stable vascular status intact to all toes of both feet.  Infectious disease has been consulted for likely need for IV antibiotics prolonged period patient may resume HBO therapy and local wound care with wound care center upon discharge.  Will see patient again for tomorrow for dressing change and follow-up on culture results.  Patient should remain nonweightbearing at all times the right lower extremity except for heel transfers.  PT has been ordered.  Patient remains at high risk for limb loss.  COMPLICATIONS: None  CONDITION: Good, stable  Rosetta Posner, DPM

## 2023-02-25 NOTE — Anesthesia Procedure Notes (Signed)
Procedure Name: LMA Insertion Date/Time: 02/25/2023 12:33 PM  Performed by: Lanell Matar, CRNAPre-anesthesia Checklist: Patient identified, Emergency Drugs available, Suction available and Patient being monitored Patient Re-evaluated:Patient Re-evaluated prior to induction Oxygen Delivery Method: Circle System Utilized Preoxygenation: Pre-oxygenation with 100% oxygen Induction Type: IV induction Ventilation: Mask ventilation without difficulty LMA: LMA inserted LMA Size: 5.0 Number of attempts: 1 Airway Equipment and Method: Bite block Placement Confirmation: positive ETCO2 Tube secured with: Tape Dental Injury: Teeth and Oropharynx as per pre-operative assessment  Comments: Pt with very poor dentition, teeth are black and broken in many places. Full discussion was had with pt regarding dental risk during anesthesia. Pt wishes to proceed. LMA placed without difficulty. No dental damage occurred during induction//intubation.

## 2023-02-25 NOTE — Transfer of Care (Signed)
Immediate Anesthesia Transfer of Care Note  Patient: [name]  Procedure(s) Performed: RIGHT PARTIAL RAY AMPUTATION (Right: Toe)  Patient Location: PACU  Anesthesia Type:General  Level of Consciousness: awake, drowsy, and patient cooperative  Airway & Oxygen Therapy: Patient Spontanous Breathing and Patient connected to face mask oxygen  Post-op Assessment: Report given to RN, Post -op Vital signs reviewed and stable, and Patient moving all extremities X 4  Post vital signs: Reviewed and stable  Last Vitals:  Vitals Value Taken Time  BP 106/64 02/25/23 1349  Temp    Pulse 91 02/25/23 1353  Resp 17 02/25/23 1353  SpO2 99 % 02/25/23 1353  Vitals shown include unfiled device data.  Last Pain:  Vitals:   02/25/23 1208  TempSrc: Oral  PainSc: 2       Patients Stated Pain Goal: 0 (02/24/23 2237)  Complications: No notable events documented.

## 2023-02-25 NOTE — Progress Notes (Signed)
PODIATRY / FOOT AND ANKLE SURGERY PROGRESS NOTE  Requesting Physician: Dr. Chipper Herb  Reason for consult: Foot ulcers both feet, infection right foot   HPI: [name] is a 42 y.o. male who presents resting in bed fairly comfortably overall.  Patient is awaiting his MRI results and planning for surgery today still.  PMHx:  Past Medical History:  Diagnosis Date   ADHD    Anxiety    Bipolar disorder (HCC)    Depression    Diabetes mellitus without complication (HCC)    type 2   Heart murmur    as a child   HLD (hyperlipidemia)    Hypertension    no meds   Peripheral vascular disease (HCC)     Surgical Hx:  Past Surgical History:  Procedure Laterality Date   AMPUTATION Left 03/06/2020   Procedure: LEFT GREAT TOE AMPUTATION;  Surgeon: [name], MD;  Location: [facility] OR;  Service: Orthopedics;  Laterality: Left;   AMPUTATION Right 10/15/2021   Procedure: RIGHT GREAT TOE AMPUTATION;  Surgeon: [name], MD;  Location: [facility] OR;  Service: Orthopedics;  Laterality: Right;   EYE SURGERY Bilateral    lasik   WISDOM TOOTH EXTRACTION      FHx:  Family History  Problem Relation Age of Onset   Diabetes Mother    Hyperlipidemia Father    Hypertension Father     Social History:  reports that he has never smoked. He has never used smokeless tobacco. He reports that he does not currently use alcohol. He reports that he does not use drugs.  Allergies:  Allergies  Allergen Reactions   Milk-Related Compounds Diarrhea   Medications Prior to Admission  Medication Sig Dispense Refill   atomoxetine (STRATTERA) 80 MG capsule Take 1 capsule (80 mg total) by mouth daily. 30 capsule 0   doxycycline (MONODOX) 100 MG capsule Take 1 capsule (100 mg total) by mouth 2 (two) times daily for 7 days 14 capsule 0   Insulin Glargine (BASAGLAR KWIKPEN) 100 UNIT/ML Inject 60 Units into the skin at bedtime. 15 mL 6   metFORMIN (GLUCOPHAGE-XR) 500 MG 24 hr tablet Take 4 tablets (2,000 mg total)  by mouth daily with breakfast. 360 tablet 1   pravastatin (PRAVACHOL) 40 MG tablet Take 1 tablet (40 mg total) by mouth once daily. 90 tablet 1   sertraline (ZOLOFT) 50 MG tablet Take 1 tablet (50 mg total) by mouth daily. 30 tablet 3   blood glucose meter kit and supplies Dispense based on patient and insurance preference. Use up to four times daily as directed. (FOR ICD-10 E10.9, E11.9). 1 each 0   ciprofloxacin (CIPRO) 500 MG tablet Take 1 tablet (500 mg total) by mouth 2 (two) times daily for 10 days (Patient not taking: Reported on 02/24/2023) 20 tablet 0   doxycycline (VIBRA-TABS) 100 MG tablet Take 1 tablet (100 mg total) by mouth 2 (two) times daily for 14 days (Patient not taking: Reported on 02/24/2023) 28 tablet 0   Dulaglutide (TRULICITY) 0.75 MG/0.5ML SOPN Inject 0.75 mg into the skin once a week. (Patient not taking: Reported on 02/24/2023) 2 mL 6   Insulin Pen Needle (PEN NEEDLES) 31G X 8 MM MISC UAD 100 each 6    Physical Exam: General: Alert and oriented.  No apparent distress.  Vascular: DP/PT pulses palpable, capillary fill time intact to digits bilaterally.  Hair growth present to bilateral lower extremities.  Neuro: Light touch sensation reduced to nearly absent to bilateral lower  extremities.  Derm: Ulceration present to the plantar aspect of the first metatarsal head right foot, measures approximately 0.3 x 1 cm and probes deep to bone, slightly more dorsal than this there is a open ulceration as well that measures approximately 0.5 x 0.5 cm and probes deep to bone approximately 2 cm at the first metatarsal head.  Seropurulent discharge present to the areas with associated erythema and edema present at this spot extending to the midfoot.    Open ulcerations present also to the left foot including the plantar first metatarsal head which measures approximately 0.5 x 0.5 x 0.2 cm cm, fourth metatarsal phalange joint plantar which measures approximately 0.5 x 1 cm -dermal  tissue, fifth metatarsal phalange joint lateral which measures 1 cm x 0.2 cm x 0.5 cm, no bone exposed to these areas and no corresponding erythema or edema, minimal serous drainage, no signs of infection to the left foot.  MSK: Hallux amputations bilaterally  Results for orders placed or performed during the hospital encounter of 02/24/23 (from the past 48 hour(s))  Lactic acid, plasma     Status: Abnormal   Collection Time: 02/24/23 12:44 PM  Result Value Ref Range   Lactic Acid, Venous 2.4 (HH) 0.5 - 1.9 mmol/L    Comment: CRITICAL RESULT CALLED TO, READ BACK BY AND VERIFIED WITH HEATHER FISHER 02/24/23 1355 MU Performed at [facility], [address] [place], [place] [address]   Comprehensive metabolic panel     Status: Abnormal   Collection Time: 02/24/23 12:44 PM  Result Value Ref Range   Sodium 134 (L) 135 - 145 mmol/L   Potassium 4.1 3.5 - 5.1 mmol/L   Chloride 97 (L) 98 - 111 mmol/L   CO2 25 22 - 32 mmol/L   Glucose, Bld 345 (H) 70 - 99 mg/dL    Comment: Glucose reference range applies only to samples taken after fasting for at least 8 hours.   BUN 14 6 - 20 mg/dL   Creatinine, Ser 6.04 0.61 - 1.24 mg/dL   Calcium 9.2 8.9 - 54.0 mg/dL   Total Protein 9.2 (H) 6.5 - 8.1 g/dL   Albumin 4.3 3.5 - 5.0 g/dL   AST 13 (L) 15 - 41 U/L   ALT 15 0 - 44 U/L   Alkaline Phosphatase 95 38 - 126 U/L   Total Bilirubin 1.7 (H) 0.3 - 1.2 mg/dL   GFR, Estimated >98 >11 mL/min    Comment: (NOTE) Calculated using the CKD-EPI Creatinine Equation (2021)    Anion gap 12 5 - 15    Comment: Performed at [facility], [address] [place], [place] [address]  CBC with Differential     Status: Abnormal   Collection Time: 02/24/23 12:44 PM  Result Value Ref Range   WBC 13.7 (H) 4.0 - 10.5 K/uL   RBC 5.62 4.22 - 5.81 MIL/uL   Hemoglobin 15.6 13.0 - 17.0 g/dL   HCT 29.5 62.1 - 30.8 %   MCV 81.7 80.0 - 100.0 fL   MCH 27.8 26.0 - 34.0 pg   MCHC 34.0 30.0 - 36.0 g/dL   RDW  65.7 84.6 - 96.2 %   Platelets 341 150 - 400 K/uL   nRBC 0.0 0.0 - 0.2 %   Neutrophils Relative % 67 %   Neutro Abs 9.2 (H) 1.7 - 7.7 K/uL   Lymphocytes Relative 22 %   Lymphs Abs 3.0 0.7 - 4.0 K/uL   Monocytes Relative 8 %   Monocytes  Absolute 1.1 (H) 0.1 - 1.0 K/uL   Eosinophils Relative 2 %   Eosinophils Absolute 0.3 0.0 - 0.5 K/uL   Basophils Relative 1 %   Basophils Absolute 0.1 0.0 - 0.1 K/uL   Immature Granulocytes 0 %   Abs Immature Granulocytes 0.06 0.00 - 0.07 K/uL    Comment: Performed at [facility], [address] [place], [place] [address]  Lactic acid, plasma     Status: None   Collection Time: 02/24/23  2:58 PM  Result Value Ref Range   Lactic Acid, Venous 1.6 0.5 - 1.9 mmol/L    Comment: Performed at [facility], [address] [place], [place] [address]  Urinalysis, w/ Reflex to Culture (Infection Suspected) -Urine, Clean Catch     Status: Abnormal   Collection Time: 02/24/23  4:11 PM  Result Value Ref Range   Specimen Source URINE, CLEAN CATCH    Color, Urine YELLOW (A) YELLOW   APPearance HAZY (A) CLEAR   Specific Gravity, Urine 1.033 (H) 1.005 - 1.030   pH 5.0 5.0 - 8.0   Glucose, UA >=500 (A) NEGATIVE mg/dL   Hgb urine dipstick NEGATIVE NEGATIVE   Bilirubin Urine NEGATIVE NEGATIVE   Ketones, ur 20 (A) NEGATIVE mg/dL   Protein, ur NEGATIVE NEGATIVE mg/dL   Nitrite NEGATIVE NEGATIVE   Leukocytes,Ua NEGATIVE NEGATIVE   RBC / HPF 0-5 0 - 5 RBC/hpf   WBC, UA 0-5 0 - 5 WBC/hpf    Comment:        Reflex urine culture not performed if WBC <=10, OR if Squamous epithelial cells >5. If Squamous epithelial cells >5 suggest recollection.    Bacteria, UA NONE SEEN NONE SEEN   Squamous Epithelial / HPF 0-5 0 - 5 /HPF   Mucus PRESENT    Hyaline Casts, UA PRESENT     Comment: Performed at [facility], [address] [place], [place] [address]  HIV Antibody (routine testing w rflx)     Status: None   Collection Time: 02/24/23   4:11 PM  Result Value Ref Range   HIV Screen 4th Generation wRfx Non Reactive Non Reactive    Comment: Performed at [facility], [address], [place], [place] [address]  Hemoglobin A1c     Status: Abnormal   Collection Time: 02/24/23  4:11 PM  Result Value Ref Range   Hgb A1c MFr Bld 9.6 (H) 4.8 - 5.6 %    Comment: (NOTE) Pre diabetes:          5.7%-6.4%  Diabetes:              >6.4%  Glycemic control for   <7.0% adults with diabetes    Mean Plasma Glucose 228.82 mg/dL    Comment: Performed at [facility], [address], [place], [place] [address]  Glucose, capillary     Status: Abnormal   Collection Time: 02/24/23  5:31 PM  Result Value Ref Range   Glucose-Capillary 228 (H) 70 - 99 mg/dL    Comment: Glucose reference range applies only to samples taken after fasting for at least 8 hours.  Glucose, capillary     Status: Abnormal   Collection Time: 02/24/23  9:33 PM  Result Value Ref Range   Glucose-Capillary 257 (H) 70 - 99 mg/dL    Comment: Glucose reference range applies only to samples taken after fasting for at least 8 hours.  Surgical pcr screen     Status: None  Collection Time: 02/24/23 10:29 PM   Specimen: Nasal Mucosa; Nasal Swab  Result Value Ref Range   MRSA, PCR NEGATIVE NEGATIVE   Staphylococcus aureus NEGATIVE NEGATIVE    Comment: (NOTE) The Xpert SA Assay (FDA approved for NASAL specimens in patients 64 years of age and older), is one component of a comprehensive surveillance program. It is not intended to diagnose infection nor to guide or monitor treatment. Performed at [facility], [address] [place], [place] [address]   CBC     Status: Abnormal   Collection Time: 02/25/23  3:25 AM  Result Value Ref Range   WBC 11.3 (H) 4.0 - 10.5 K/uL   RBC 4.98 4.22 - 5.81 MIL/uL   Hemoglobin 13.6 13.0 - 17.0 g/dL   HCT 60.4 54.0 - 98.1 %   MCV 81.5 80.0 - 100.0 fL   MCH 27.3 26.0 - 34.0 pg   MCHC 33.5 30.0 - 36.0 g/dL   RDW  19.1 47.8 - 29.5 %   Platelets 290 150 - 400 K/uL   nRBC 0.0 0.0 - 0.2 %    Comment: Performed at [facility], [address] [place], [place] [address]  Basic metabolic panel     Status: Abnormal   Collection Time: 02/25/23  3:25 AM  Result Value Ref Range   Sodium 136 135 - 145 mmol/L   Potassium 4.2 3.5 - 5.1 mmol/L   Chloride 104 98 - 111 mmol/L   CO2 25 22 - 32 mmol/L   Glucose, Bld 204 (H) 70 - 99 mg/dL    Comment: Glucose reference range applies only to samples taken after fasting for at least 8 hours.   BUN 13 6 - 20 mg/dL   Creatinine, Ser 8.65 0.61 - 1.24 mg/dL   Calcium 8.5 (L) 8.9 - 10.3 mg/dL   GFR, Estimated >78 >46 mL/min    Comment: (NOTE) Calculated using the CKD-EPI Creatinine Equation (2021)    Anion gap 7 5 - 15    Comment: Performed at [facility], [address] [place], [place] [address]  Glucose, capillary     Status: Abnormal   Collection Time: 02/25/23  7:41 AM  Result Value Ref Range   Glucose-Capillary 189 (H) 70 - 99 mg/dL    Comment: Glucose reference range applies only to samples taken after fasting for at least 8 hours.   US Venous Img Lower Bilateral (DVT)  Result Date: 02/25/2023 CLINICAL DATA:  Bilateral lower extremity edema EXAM: BILATERAL LOWER EXTREMITY VENOUS DOPPLER ULTRASOUND TECHNIQUE: Gray-scale sonography with graded compression, as well as color Doppler and duplex ultrasound were performed to evaluate the lower extremity deep venous systems from the level of the common femoral vein and including the common femoral, femoral, profunda femoral, popliteal and calf veins including the posterior tibial, peroneal and gastrocnemius veins when visible. The superficial great saphenous vein was also interrogated. Spectral Doppler was utilized to evaluate flow at rest and with distal augmentation maneuvers in the common femoral, femoral and popliteal veins. COMPARISON:  None Available. FINDINGS: RIGHT LOWER EXTREMITY Common  Femoral Vein: No evidence of thrombus. Normal compressibility, respiratory phasicity and response to augmentation. Saphenofemoral Junction: No evidence of thrombus. Normal compressibility and flow on color Doppler imaging. Profunda Femoral Vein: No evidence of thrombus. Normal compressibility and flow on color Doppler imaging. Femoral Vein: No evidence of thrombus. Normal compressibility, respiratory phasicity and response to augmentation. Popliteal Vein: No evidence of thrombus. Normal compressibility, respiratory phasicity and response to augmentation. Calf  Veins: No evidence of thrombus. Normal compressibility and flow on color Doppler imaging. Superficial Great Saphenous Vein: No evidence of thrombus. Normal compressibility. Venous Reflux:  None. Other Findings:  None. LEFT LOWER EXTREMITY Common Femoral Vein: No evidence of thrombus. Normal compressibility, respiratory phasicity and response to augmentation. Saphenofemoral Junction: No evidence of thrombus. Normal compressibility and flow on color Doppler imaging. Profunda Femoral Vein: No evidence of thrombus. Normal compressibility and flow on color Doppler imaging. Femoral Vein: No evidence of thrombus. Normal compressibility, respiratory phasicity and response to augmentation. Popliteal Vein: No evidence of thrombus. Normal compressibility, respiratory phasicity and response to augmentation. Calf Veins: No evidence of thrombus. Normal compressibility and flow on color Doppler imaging. Superficial Great Saphenous Vein: No evidence of thrombus. Normal compressibility. Venous Reflux:  None. Other Findings:  None. IMPRESSION: No evidence of deep venous thrombosis in either lower extremity. Electronically Signed   By: Malachy Moan M.D.   On: 02/25/2023 10:04   DG Foot Complete Right  Result Date: 02/24/2023 CLINICAL DATA:  Worsening right foot wound EXAM: RIGHT FOOT COMPLETE - 3 VIEW COMPARISON:  Right foot radiographs dated 02/18/2023 FINDINGS:  Postsurgical changes from amputation of the great toe. Similar appearance of soft tissue wound at the amputation site. There is no evidence of fracture or dislocation. Similar cortical thickening of the second metatarsal shaft and widened second metatarsophalangeal joint space. No focal cortical lucency. Degenerative changes of the foot. Diffuse soft tissue edema of the forefoot. Soft tissues are unremarkable. IMPRESSION: 1. Postsurgical changes from amputation of the great toe. Similar appearance of soft tissue wound at the amputation site. 2. Similar cortical thickening of the second metatarsal shaft and widened second metatarsophalangeal joint space. No focal cortical lucency to suggest osteomyelitis. Electronically Signed   By: Agustin Cree M.D.   On: 02/24/2023 14:11    Blood pressure 129/80, pulse 76, temperature 97.6 F (36.4 C), temperature source Oral, resp. rate 18, height 6\' 1"  (1.854 m), weight (!) 145.2 kg, SpO2 99%.   Assessment Osteomyelitis right first metatarsal with associated diabetic foot ulceration and cellulitis Stable osteomyelitis of the left 5th MTPJ Multiple diabetic foot ulcerations to the left foot including plantar first metatarsal, fourth metatarsal phalange joint, and lateral fifth metatarsal phalangeal joint Diabetes type 2 with polyneuropathy, uncontrolled  Plan -Patient seen and examined. -X-ray imaging reviewed.  MRIs reviewed with patient and both feet showing osteomyelitis present to the fifth metatarsal phalangeal joint extending to the midshaft and base area of the fifth metatarsal left foot and acute osteomyelitis to the distal aspect of the first metatarsal head right foot. -ABIs have been ordered.  Believe that patient circulation is adequate as I can feel his pulses today and he does have feet that are warm to touch.  Still would like to get these as a baseline. -Discussed that left foot appears to be stable at this time clinically but does have chronic  osteomyelitis present to the area.  Discussed amputation versus continued conservative care with antibiotic therapy and HBO and local wound care.  Believe that infection could worsen over time resulting in further loss of bone if needing amputation in future.  Discussed partial fifth ray amputation would be likely the ideal choice.  Patient refuses this and elects to undergo conservative care but would like a biopsy performed to the area of bone. -All treatment options were discussed with the patient of both conservative and surgical attempts at correction including potential risks and complications.  Patient has elected for procedure consisting  of right partial first ray amputation with excision of ulceration and closure with possible antibiotic bead application and left fifth metatarsal bone biopsy.  No guarantees given.  Consent obtained. -Patient will likely require IV antibiotics.  Will consider infectious disease consultation after surgery today.  Planning for surgery today around noon -Patient is at high risk for limb loss due to multiple ulcerations, previous history of amputation, and current infection present.  Rosetta Posner, DPM 02/25/2023, 10:20 AM

## 2023-02-25 NOTE — Anesthesia Preprocedure Evaluation (Addendum)
Anesthesia Evaluation  Patient identified by MRN, date of birth, ID band Patient awake    Reviewed: Allergy & Precautions, NPO status , Patient's Chart, lab work & pertinent test results  History of Anesthesia Complications Negative for: history of anesthetic complications  Airway Mallampati: I   Neck ROM: Full    Dental  (+) Poor Dentition, Missing, Chipped   Pulmonary neg pulmonary ROS   Pulmonary exam normal breath sounds clear to auscultation       Cardiovascular hypertension, + Peripheral Vascular Disease  Normal cardiovascular exam Rhythm:Regular Rate:Normal  ECG 12/10/22: normal   Neuro/Psych  PSYCHIATRIC DISORDERS (ADHD) Anxiety Depression Bipolar Disorder   negative neurological ROS     GI/Hepatic negative GI ROS,,,  Endo/Other  diabetes, Poorly Controlled, Type 2, Insulin Dependent  Class 3 obesity  Renal/GU      Musculoskeletal   Abdominal   Peds  Hematology negative hematology ROS (+)   Anesthesia Other Findings   Reproductive/Obstetrics                             Anesthesia Physical Anesthesia Plan  ASA: 3  Anesthesia Plan: General   Post-op Pain Management:    Induction: Intravenous  PONV Risk Score and Plan: 2 and Ondansetron, Dexamethasone and Treatment may vary due to age or medical condition  Airway Management Planned: LMA  Additional Equipment:   Intra-op Plan:   Post-operative Plan: Extubation in OR  Informed Consent: I have reviewed the patients History and Physical, chart, labs and discussed the procedure including the risks, benefits and alternatives for the proposed anesthesia with the patient or authorized representative who has indicated his/her understanding and acceptance.     Dental advisory given  Plan Discussed with: CRNA  Anesthesia Plan Comments: (Patient consented for risks of anesthesia including but not limited to:  - adverse  reactions to medications - damage to eyes, teeth, lips or other oral mucosa - nerve damage due to positioning  - sore throat or hoarseness - damage to heart, brain, nerves, lungs, other parts of body or loss of life  Informed patient about role of CRNA in peri- and intra-operative care.  Patient voiced understanding.)        Anesthesia Quick Evaluation

## 2023-02-25 NOTE — Consult Note (Signed)
NAME: [name]  DOB: [birth year]  MRN: [id number]  Date/Time: 02/25/2023 2:07 PM  REQUESTING PROVIDER: Dr. Excell Seltzer Subjective:  REASON FOR CONSULT: rt foot infection ? [name] is a 42 y.o. 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 followed at wound clinic presents with 2 day h/o swelling, worsening discharge and redness rt foot Says he went to wound clinic last Wednesday and had debridement of callus and also received a prescription for Doxy for the rt great toe amputation sitewent to wound clinic on Tuesday and they asked him to go to the ED for IV antibiotics  Vitals in the ED  02/24/23  BP 130/81  Temp 98.2 F (36.8 C)  Pulse Rate 84  Resp 20  SpO2 99 %  Weight 320 lb (H)     Latest Reference Range & Units 02/24/23  WBC 4.0 - 10.5 K/uL 13.7 (H)  Hemoglobin 13.0 - 17.0 g/dL 54.6  HCT 27.0 - 35.0 % 45.9  Platelets 150 - 400 K/uL 341  Creatinine 0.61 - 1.24 mg/dL 0.93   MRI of the foot showed rt amputated great toe with underlying phlegmon an dpossible OM On the left foot the 5th MTP showed chronic OM Blood culture sent Started on triple antibiotic vanco/cefepime and flagyl Was seen by podiatrist and underwent surgery today Had partial ray amputation of the rt toe And left 5th MTP joint bone biopsy I am asked to see her for antibiotic management HE is a non smoker, no alcohol or illict drug use Lives with his parents 2 dogs    Past Medical History:  Diagnosis Date   ADHD    Anxiety    Bipolar disorder (HCC)    Depression    Diabetes mellitus without complication (HCC)    type 2   Heart murmur    as a child   HLD (hyperlipidemia)    Hypertension    no meds   Peripheral vascular disease (HCC)     Past Surgical History:  Procedure Laterality Date   AMPUTATION Left 03/06/2020   Procedure: LEFT GREAT TOE AMPUTATION;  Surgeon: [name],  MD;  Location: [facility] OR;  Service: Orthopedics;  Laterality: Left;   AMPUTATION Right 10/15/2021   Procedure: RIGHT GREAT TOE AMPUTATION;  Surgeon: [name], MD;  Location: [facility] [place] OR;  Service: Orthopedics;  Laterality: Right;   EYE SURGERY Bilateral    lasik   WISDOM TOOTH EXTRACTION      Social History   Socioeconomic History   Marital status: Single    Spouse name: Not on file   Number of children: Not on file   Years of education: Not on file   Highest education level: Not on file  Occupational History   Occupation: Unemployed  Tobacco Use   Smoking status: Never   Smokeless tobacco: Never  Vaping Use   Vaping status: Never Used  Substance and Sexual Activity   Alcohol use: Not Currently    Comment: last drink 2018   Drug use: Never   Sexual activity: Not on file  Other Topics Concern   Not on file  Social History Narrative   Live with parents in [place].    Social Determinants of Health   Financial Resource Strain: Not on file  Food Insecurity: No Food Insecurity (02/25/2023)   Hunger Vital Sign    Worried About Running Out of Food in the  Last Year: Never true    Ran Out of Food in the Last Year: Never true  Transportation Needs: No Transportation Needs (02/25/2023)   PRAPARE - Administrator, Civil Service (Medical): No    Lack of Transportation (Non-Medical): No  Physical Activity: Not on file  Stress: Not on file  Social Connections: Not on file  Intimate Partner Violence: Not At Risk (02/25/2023)   Humiliation, Afraid, Rape, and Kick questionnaire    Fear of Current or Ex-Partner: No    Emotionally Abused: No    Physically Abused: No    Sexually Abused: No    Family History  Problem Relation Age of Onset   Diabetes Mother    Hyperlipidemia Father    Hypertension Father    Allergies  Allergen Reactions   Milk-Related Compounds Diarrhea   I? Current Facility-Administered Medications  Medication Dose Route Frequency Provider Last Rate  Last Admin   acetaminophen (OFIRMEV) IV 1,000 mg  1,000 mg Intravenous Once PRN Reed Breech, MD       [MAR Hold] acetaminophen (TYLENOL) tablet 650 mg  650 mg Oral Q6H PRN Mikey College T, MD   650 mg at 02/25/23 0816   Or   [MAR Hold] acetaminophen (TYLENOL) suppository 650 mg  650 mg Rectal Q6H PRN Emeline General, MD       [facility] Hold] acidophilus (RISAQUAD) capsule 1 capsule  1 capsule Oral TID Mikey College T, MD   1 capsule at 02/24/23 2218   [MAR Hold] atomoxetine (STRATTERA) capsule 80 mg  80 mg Oral Daily Emeline General, MD       [facility] Hold] ceFEPIme (MAXIPIME) 2 g in sodium chloride 0.9 % 100 mL IVPB  2 g Intravenous Q8H Barrie Folk, RPH 200 mL/hr at 02/25/23 0824 2 g at 02/25/23 0824   [MAR Hold] enoxaparin (LOVENOX) injection 70 mg  70 mg Subcutaneous Q24H Mikey College T, MD   70 mg at 02/24/23 2238   fentaNYL (SUBLIMAZE) injection 25-50 mcg  25-50 mcg Intravenous Q5 min PRN Reed Breech, MD       [facility] Hold] insulin aspart (novoLOG) injection 0-20 Units  0-20 Units Subcutaneous TID WC Mikey College T, MD   6 Units at 02/25/23 0817   [MAR Hold] insulin aspart (novoLOG) injection 0-5 Units  0-5 Units Subcutaneous QHS Mikey College T, MD   3 Units at 02/24/23 2219   [facility] Hold] insulin glargine-yfgn (SEMGLEE) injection 60 Units  60 Units Subcutaneous QHS Emeline General, MD   60 Units at 02/24/23 2220   lactated ringers infusion   Intravenous Continuous Reed Breech, MD       [MAR Hold] metroNIDAZOLE (FLAGYL) IVPB 500 mg  500 mg Intravenous Q12H Emeline General, MD   Stopped at 02/25/23 0432   [MAR Hold] ondansetron (ZOFRAN) tablet 4 mg  4 mg Oral Q6H PRN Emeline General, MD       Or   [facility] Hold] ondansetron [facility]) injection 4 mg  4 mg Intravenous Q6H PRN Mikey College T, MD       ondansetron [facility]) injection 4 mg  4 mg Intravenous Once PRN Reed Breech, MD       [facility] Hold] oxyCODONE (Oxy IR/ROXICODONE) immediate release tablet 5 mg  5 mg Oral Q4H PRN Mikey College T, MD       oxyCODONE  (Oxy IR/ROXICODONE) immediate release tablet 5 mg  5 mg Oral Once PRN Reed Breech, MD       Or  oxyCODONE (ROXICODONE) 5 MG/5ML solution 5 mg  5 mg Oral Once PRN Reed Breech, MD       povidone-iodine 10 % swab 2 Application  2 Application Topical Once Rosetta Posner, DPM       [facility] Hold] pravastatin (PRAVACHOL) tablet 40 mg  40 mg Oral QHS Mikey College T, MD   40 mg at 02/24/23 2218   [facility] Hold] senna-docusate (Senokot-S) tablet 1 tablet  1 tablet Oral QHS PRN Emeline General, MD       [facility] Hold] sertraline (ZOLOFT) tablet 50 mg  50 mg Oral Daily Mikey College T, MD   50 mg at 02/24/23 1749   [MAR Hold] vancomycin (VANCOREADY) IVPB 2000 mg/400 mL  2,000 mg Intravenous Q12H Barrie Folk, RPH 200 mL/hr at 02/25/23 1610 Infusion Verify at 02/25/23 9604     Abtx:  Anti-infectives (From admission, onward)    Start     Dose/Rate Route Frequency Ordered Stop   02/25/23 1308  vancomycin (VANCOCIN) powder  Status:  Discontinued          As needed 02/25/23 1308 02/25/23 1348   02/25/23 0500  [MAR Hold]  vancomycin (VANCOREADY) IVPB 2000 mg/400 mL        (MAR Hold since Thu 02/25/2023 at 1145.Hold Reason: Transfer to a Procedural area)   2,000 mg 200 mL/hr over 120 Minutes Intravenous Every 12 hours 02/24/23 1639     02/25/23 0300  [MAR Hold]  metroNIDAZOLE (FLAGYL) IVPB 500 mg        (MAR Hold since Thu 02/25/2023 at 1145.Hold Reason: Transfer to a Procedural area)   500 mg 100 mL/hr over 60 Minutes Intravenous Every 12 hours 02/24/23 1600     02/24/23 2300  [MAR Hold]  ceFEPIme (MAXIPIME) 2 g in sodium chloride 0.9 % 100 mL IVPB        (MAR Hold since Thu 02/25/2023 at 1145.Hold Reason: Transfer to a Procedural area)   2 g 200 mL/hr over 30 Minutes Intravenous Every 8 hours 02/24/23 1631     02/24/23 1700  vancomycin (VANCOREADY) IVPB 500 mg/100 mL        500 mg 100 mL/hr over 60 Minutes Intravenous  Once 02/24/23 1631 02/24/23 2135   02/24/23 1445  ceFEPIme (MAXIPIME) 2 g in sodium  chloride 0.9 % 100 mL IVPB        2 g 200 mL/hr over 30 Minutes Intravenous  Once 02/24/23 1436 02/24/23 1607   02/24/23 1445  metroNIDAZOLE (FLAGYL) IVPB 500 mg        500 mg 100 mL/hr over 60 Minutes Intravenous  Once 02/24/23 1436 02/24/23 1609   02/24/23 1445  vancomycin (VANCOCIN) IVPB 1000 mg/200 mL premix  Status:  Discontinued        1,000 mg 200 mL/hr over 60 Minutes Intravenous  Once 02/24/23 1436 02/24/23 1441   02/24/23 1445  vancomycin (VANCOREADY) IVPB 2000 mg/400 mL        2,000 mg 200 mL/hr over 120 Minutes Intravenous  Once 02/24/23 1442 02/24/23 1829       REVIEW OF SYSTEMS:  Const: negative fever, negative chills, negative weight loss Eyes: negative diplopia or visual changes, negative eye pain ENT: negative coryza, negative sore throat Resp: negative cough, hemoptysis, dyspnea Cards: negative for chest pain, palpitations, lower extremity edema GU: negative for frequency, dysuria and hematuria GI: Negative for abdominal pain, diarrhea, bleeding, constipation Skin: negative for rash and pruritus Heme: negative for easy bruising and gum/nose bleeding MS: as above  Neurolo:negative for headaches, dizziness, vertigo, memory problems  Psych: negative for feelings of anxiety, depression  Endocrine: negative for thyroid, diabetes Allergy/Immunology- negative for any medication  allergies ? Objective:  VITALS:  BP 97/68   Pulse 86   Temp (!) 97.2 F (36.2 C)   Resp 19   Ht 6\' 1"  (1.854 m)   Wt (!) 145.2 kg   SpO2 97%   BMI 42.23 kg/m   PHYSICAL EXAM:  General: Alert, cooperative, no distress, appears stated age.  Head: Normocephalic, without obvious abnormality, atraumatic. Eyes: Conjunctivae clear, anicteric sclerae. Pupils are equal ENT Nares normal. No drainage or sinus tenderness. Lips, mucosa, and tongue normal. No Thrush Neck: Supple, symmetrical, no adenopathy, thyroid: non tender no carotid bruit and no JVD. Back: No CVA tenderness. Lungs: Clear  to auscultation bilaterally. No Wheezing or Rhonchi. No rales. Heart: Regular rate and rhythm, no murmur, rub or gallop. Abdomen: Soft, non-tender,not distended. Bowel sounds normal. No masses Extremities: surgical dressing both feet not removed Pictures reviewed     Skin: No rashes or lesions. Or bruising Lymph: Cervical, supraclavicular normal. Neurologic: Grossly non-focal Pertinent Labs Lab Results CBC    Component Value Date/Time   WBC 11.3 (H) 02/25/2023 0325   RBC 4.98 02/25/2023 0325   HGB 13.6 02/25/2023 0325   HGB 15.5 12/31/2022 1519   HCT 40.6 02/25/2023 0325   HCT 48.2 12/31/2022 1519   PLT 290 02/25/2023 0325   PLT 329 12/31/2022 1519   MCV 81.5 02/25/2023 0325   MCV 85 12/31/2022 1519   MCH 27.3 02/25/2023 0325   MCHC 33.5 02/25/2023 0325   RDW 13.2 02/25/2023 0325   RDW 13.4 12/31/2022 1519   LYMPHSABS 3.0 02/24/2023 1244   LYMPHSABS 3.3 (H) 06/04/2016 1030   MONOABS 1.1 (H) 02/24/2023 1244   EOSABS 0.3 02/24/2023 1244   EOSABS 0.1 06/04/2016 1030   BASOSABS 0.1 02/24/2023 1244   BASOSABS 0.0 06/04/2016 1030       Latest Ref Rng & Units 02/25/2023    3:25 AM 02/24/2023   12:44 PM 12/31/2022    3:19 PM  CMP  Glucose 70 - 99 mg/dL 664  403  474   BUN 6 - 20 mg/dL 13  14  10    Creatinine 0.61 - 1.24 mg/dL 2.59  5.63  8.75   Sodium 135 - 145 mmol/L 136  134  137   Potassium 3.5 - 5.1 mmol/L 4.2  4.1  4.2   Chloride 98 - 111 mmol/L 104  97  98   CO2 22 - 32 mmol/L 25  25  24    Calcium 8.9 - 10.3 mg/dL 8.5  9.2  9.5   Total Protein 6.5 - 8.1 g/dL  9.2  7.8   Total Bilirubin 0.3 - 1.2 mg/dL  1.7  0.5   Alkaline Phos 38 - 126 U/L  95  120   AST 15 - 41 U/L  13  22   ALT 0 - 44 U/L  15  20       Microbiology: Recent Results (from the past 240 hour(s))  Blood culture (single)     Status: None (Preliminary result)   Collection Time: 02/24/23 12:44 PM   Specimen: BLOOD  Result Value Ref Range Status   Specimen Description BLOOD RIGHT ANTECUBITAL   Final   Special Requests   Final    BOTTLES DRAWN AEROBIC AND ANAEROBIC Blood Culture adequate volume   Culture   Final    NO GROWTH < 24  HOURS Performed at [facility], [address] [place], [place] [address]    Report Status PENDING  Incomplete  Blood culture (single)     Status: None (Preliminary result)   Collection Time: 02/24/23  2:56 PM   Specimen: BLOOD  Result Value Ref Range Status   Specimen Description BLOOD BLOOD LEFT FOREARM  Final   Special Requests   Final    BOTTLES DRAWN AEROBIC AND ANAEROBIC Blood Culture adequate volume   Culture   Final    NO GROWTH < 24 HOURS Performed at [facility], [address] [place], [place] [address]    Report Status PENDING  Incomplete  Surgical pcr screen     Status: None   Collection Time: 02/24/23 10:29 PM   Specimen: Nasal Mucosa; Nasal Swab  Result Value Ref Range Status   MRSA, PCR NEGATIVE NEGATIVE Final   Staphylococcus aureus NEGATIVE NEGATIVE Final    Comment: (NOTE) The Xpert SA Assay (FDA approved for NASAL specimens in patients 54 years of age and older), is one component of a comprehensive surveillance program. It is not intended to diagnose infection nor to guide or monitor treatment. Performed at [facility], [address] [place], [place] [address]     IMAGING RESULTS: MRI reviewed Phlegmon 1st toe area Osteomyelitis I have personally reviewed the films ? Impression/Recommendation Diabetic foot infection complicated by peripheral neuropathy and PVD Rt great toe amputation site ws infected and underlying bone osteomyelitis s/p surgery partial ray excision- sent for pathology and culture On vanco, cefepime and flagyl Await culture to deescalate  Multiple ulcers both feet  DM- not well controlled  PVD  Bipolar disorder ADHD ? Discussed the management with the patient in detail ? ___I have personally spent  -60--minutes involved in face-to-face and  non-face-to-face activities for this patient on the day of the visit. Professional time spent includes the following activities: Preparing to see the patient (review of tests), Obtaining and/or reviewing separately obtained history (admission/discharge record), Performing a medically appropriate examination and/or evaluation , Ordering medications/tests/procedures, referring and communicating with other health care professionals, Documenting clinical information in the EMR, Independently interpreting results (not separately reported), Communicating results to the patient/family/caregiver, Counseling and educating the patient/family/caregiver and Care coordination (not separately reported).    ________________________________________________

## 2023-02-25 NOTE — Plan of Care (Signed)
  Problem: Education: Goal: Ability to describe self-care measures that may prevent or decrease complications (Diabetes Survival Skills Education) will improve Outcome: Progressing Goal: Individualized Educational Video(s) Outcome: Progressing   Problem: Coping: Goal: Ability to adjust to condition or change in health will improve Outcome: Progressing   Problem: Fluid Volume: Goal: Ability to maintain a balanced intake and output will improve Outcome: Progressing   Problem: Health Behavior/Discharge Planning: Goal: Ability to identify and utilize available resources and services will improve Outcome: Progressing Goal: Ability to manage health-related needs will improve Outcome: Progressing   Problem: Skin Integrity: Goal: Risk for impaired skin integrity will decrease Outcome: Progressing   Problem: Tissue Perfusion: Goal: Adequacy of tissue perfusion will improve Outcome: Progressing   Problem: Education: Goal: Knowledge of General Education information will improve Description: Including pain rating scale, medication(s)/side effects and non-pharmacologic comfort measures Outcome: Progressing

## 2023-02-25 NOTE — Progress Notes (Signed)
PROGRESS NOTE    [name]   ZOX:[id number] DOB: [birth year]  DOA: 02/24/2023 Date of Service: 02/25/23 which is hospital day 1  PCP: Marcine Matar, MD    HPI: [name] is a 42 y.o. male with medical history significant of IDDM with insulin resistance, HTN, HLD, PVD, anxiety/depression, presented with worsening of right foot infection. Chronic L foot osteomyelitis, following with podiatry and wound care, chronic abx and barometric chamber. 7 days ago (+)new ulcer on the R foot plantar side of the forefoot, with rash, purulent discharge and swelling - started on antibiotics doxycycline 7 days ago but worsening, wound care sent him to ED   Hospital course / significant events:  10/02:  admitted to hospitalist service w/ podiatry consult. Started vanc, Cefepime, Flagyl. Podiatry planning for amputation tomorrow, pending MRI and ABI, pending DVT US  10/03: surgery today for amputation R 1st toe and bx L 5th toe. ID consult   Consultants:  Podiatry   Procedures/Surgeries: 02/24/23: debridement of ulcers Right first metatarsal plantar, Left plantar first metatarsal, Left plantar fourth metatarsal phalangeal joint, Left lateral fifth metatarsal phalange joint - Dr Excell Seltzer, podiatry  02/25/23: Right partial first ray amputation with antibiotic bead application, and Left fifth metatarsal phalangeal joint bone biopsy      ASSESSMENT & PLAN:   Osteomyelitis - chronic L foot, potential new R foot - POA Right foot diabetic ulcer infection, failed outpatient management - POA High risk for limb loss due to multiple ulcerations, previous history of amputation, and current infection present.  S/p wound debridements at bedside by podiatry 02/24/23 S/p Right partial first ray amputation with antibiotic bead application, and Left fifth metatarsal phalangeal joint bone biopsy 02/25/23  MRI: pending DVT US: neg for DVT BL LE ABI: pending Podiatry following broad-spectrum  antibiotics of vancomycin, cefepime and Flagyl pending ID consult    Peripheral vascular disease ABI pending   IDDM with hyperglycemia Significant insulin resistance Continue Lantus Add SSI Hold off metformin    Morbid obesity based on BMI and comorbid illnesses: Body mass index is 42.22 kg/m.   DVT prophylaxis: lovenox  IV fluids: no continuous IV fluids  Nutrition: carb modified diet  Central lines / invasive devices: none  Code Status: FULL CODE ACP documentation reviewed: 02/25/23 and none on file in [facility]  TOC needs: TBD may need HH/DME Barriers to dispo / significant pending items: ID consult, PT/OT eval, MRI and ABI results, podiatry clearance - possible d/c tomorrow pending abx recs               Subjective / Brief ROS:  Patient reports doing well following surgery  Denies CP/SOB.  Pain controlled.  Denies new weakness.  Tolerating diet.  Reports no concerns w/ urination/defecation.   Family Communication: famikly at bedside on rounds     Objective Findings:  Vitals:   02/25/23 1400 02/25/23 1415 02/25/23 1430 02/25/23 1447  BP: 97/68 114/70 116/76 114/80  Pulse: 86 90 81 82  Resp: 19 16 16 17   Temp:  (!) 97.4 F (36.3 C)  97.7 F (36.5 C)  TempSrc:      SpO2: 97% 99% 99% 99%  Weight:      Height:        Intake/Output Summary (Last 24 hours) at 02/25/2023 1453 Last data filed at 02/25/2023 1353 Gross per 24 hour  Intake 2305.99 ml  Output 20 ml  Net 2285.99 ml   Filed Weights   02/24/23 1240 02/25/23 1208  Weight: Marland Kitchen)  145.2 kg (!) 145.2 kg    Examination:  Physical Exam Constitutional:      General: He is not in acute distress.    Appearance: He is obese.  Cardiovascular:     Rate and Rhythm: Normal rate and regular rhythm.  Pulmonary:     Effort: Pulmonary effort is normal.     Breath sounds: Normal breath sounds.  Abdominal:     Palpations: Abdomen is soft.  Neurological:     Mental Status: He is alert.  Psychiatric:         Mood and Affect: Mood normal.        Behavior: Behavior normal.          Scheduled Medications:   acidophilus  1 capsule Oral TID   atomoxetine  80 mg Oral Daily   enoxaparin (LOVENOX) injection  70 mg Subcutaneous Q24H   insulin aspart  0-20 Units Subcutaneous TID WC   insulin aspart  0-5 Units Subcutaneous QHS   insulin glargine-yfgn  60 Units Subcutaneous QHS   pravastatin  40 mg Oral QHS   sertraline  50 mg Oral Daily    Continuous Infusions:  ceFEPime (MAXIPIME) IV 2 g (02/25/23 0824)   metronidazole Stopped (02/25/23 0432)   vancomycin 200 mL/hr at 02/25/23 0629    PRN Medications:  acetaminophen **OR** acetaminophen, ondansetron **OR** ondansetron (ZOFRAN) IV, oxyCODONE, senna-docusate  Antimicrobials from admission:  Anti-infectives (From admission, onward)    Start     Dose/Rate Route Frequency Ordered Stop   02/25/23 1308  vancomycin (VANCOCIN) powder  Status:  Discontinued          As needed 02/25/23 1308 02/25/23 1348   02/25/23 0500  vancomycin (VANCOREADY) IVPB 2000 mg/400 mL        2,000 mg 200 mL/hr over 120 Minutes Intravenous Every 12 hours 02/24/23 1639     02/25/23 0300  metroNIDAZOLE (FLAGYL) IVPB 500 mg        500 mg 100 mL/hr over 60 Minutes Intravenous Every 12 hours 02/24/23 1600     02/24/23 2300  ceFEPIme (MAXIPIME) 2 g in sodium chloride 0.9 % 100 mL IVPB        2 g 200 mL/hr over 30 Minutes Intravenous Every 8 hours 02/24/23 1631     02/24/23 1700  vancomycin (VANCOREADY) IVPB 500 mg/100 mL        500 mg 100 mL/hr over 60 Minutes Intravenous  Once 02/24/23 1631 02/24/23 2135   02/24/23 1445  ceFEPIme (MAXIPIME) 2 g in sodium chloride 0.9 % 100 mL IVPB        2 g 200 mL/hr over 30 Minutes Intravenous  Once 02/24/23 1436 02/24/23 1607   02/24/23 1445  metroNIDAZOLE (FLAGYL) IVPB 500 mg        500 mg 100 mL/hr over 60 Minutes Intravenous  Once 02/24/23 1436 02/24/23 1609   02/24/23 1445  vancomycin (VANCOCIN) IVPB 1000 mg/200 mL  premix  Status:  Discontinued        1,000 mg 200 mL/hr over 60 Minutes Intravenous  Once 02/24/23 1436 02/24/23 1441   02/24/23 1445  vancomycin (VANCOREADY) IVPB 2000 mg/400 mL        2,000 mg 200 mL/hr over 120 Minutes Intravenous  Once 02/24/23 1442 02/24/23 1829           Data Reviewed:  I have personally reviewed the following...  CBC: Recent Labs  Lab 02/24/23 1244 02/25/23 0325  WBC 13.7* 11.3*  NEUTROABS 9.2*  --  HGB 15.6 13.6  HCT 45.9 40.6  MCV 81.7 81.5  PLT 341 290   Basic Metabolic Panel: Recent Labs  Lab 02/24/23 1244 02/25/23 0325  NA 134* 136  K 4.1 4.2  CL 97* 104  CO2 25 25  GLUCOSE 345* 204*  BUN 14 13  CREATININE 0.82 0.68  CALCIUM 9.2 8.5*   GFR: Estimated Creatinine Clearance: 178.5 mL/min (by C-G formula based on SCr of 0.68 mg/dL). Liver Function Tests: Recent Labs  Lab 02/24/23 1244  AST 13*  ALT 15  ALKPHOS 95  BILITOT 1.7*  PROT 9.2*  ALBUMIN 4.3   No results for input(s): "LIPASE", "AMYLASE" in the last 168 hours. No results for input(s): "AMMONIA" in the last 168 hours. Coagulation Profile: No results for input(s): "INR", "PROTIME" in the last 168 hours. Cardiac Enzymes: No results for input(s): "CKTOTAL", "CKMB", "CKMBINDEX", "TROPONINI" in the last 168 hours. BNP (last 3 results) No results for input(s): "PROBNP" in the last 8760 hours. HbA1C: Recent Labs    02/24/23 1611  HGBA1C 9.6*   CBG: Recent Labs  Lab 02/24/23 1731 02/24/23 2133 02/25/23 0741 02/25/23 1127 02/25/23 1351  GLUCAP 228* 257* 189* 179* 185*   Lipid Profile: No results for input(s): "CHOL", "HDL", "LDLCALC", "TRIG", "CHOLHDL", "LDLDIRECT" in the last 72 hours. Thyroid Function Tests: No results for input(s): "TSH", "T4TOTAL", "FREET4", "T3FREE", "THYROIDAB" in the last 72 hours. Anemia Panel: No results for input(s): "VITAMINB12", "FOLATE", "FERRITIN", "TIBC", "IRON", "RETICCTPCT" in the last 72 hours. Most Recent Urinalysis On  File:     Component Value Date/Time   COLORURINE YELLOW (A) 02/24/2023 1611   APPEARANCEUR HAZY (A) 02/24/2023 1611   LABSPEC 1.033 (H) 02/24/2023 1611   PHURINE 5.0 02/24/2023 1611   GLUCOSEU >=500 (A) 02/24/2023 1611   HGBUR NEGATIVE 02/24/2023 1611   BILIRUBINUR NEGATIVE 02/24/2023 1611   BILIRUBINUR negative 08/10/2016 1116   KETONESUR 20 (A) 02/24/2023 1611   PROTEINUR NEGATIVE 02/24/2023 1611   UROBILINOGEN 0.2 08/10/2016 1116   NITRITE NEGATIVE 02/24/2023 1611   LEUKOCYTESUR NEGATIVE 02/24/2023 1611   Sepsis Labs: @LABRCNTIP (procalcitonin:4,lacticidven:4) Microbiology: Recent Results (from the past 240 hour(s))  Blood culture (single)     Status: None (Preliminary result)   Collection Time: 02/24/23 12:44 PM   Specimen: BLOOD  Result Value Ref Range Status   Specimen Description BLOOD RIGHT ANTECUBITAL  Final   Special Requests   Final    BOTTLES DRAWN AEROBIC AND ANAEROBIC Blood Culture adequate volume   Culture   Final    NO GROWTH < 24 HOURS Performed at [facility], [address] [place], [place] [address]    Report Status PENDING  Incomplete  Blood culture (single)     Status: None (Preliminary result)   Collection Time: 02/24/23  2:56 PM   Specimen: BLOOD  Result Value Ref Range Status   Specimen Description BLOOD BLOOD LEFT FOREARM  Final   Special Requests   Final    BOTTLES DRAWN AEROBIC AND ANAEROBIC Blood Culture adequate volume   Culture   Final    NO GROWTH < 24 HOURS Performed at [facility], [address], [place], [place] [address]    Report Status PENDING  Incomplete  Surgical pcr screen     Status: None   Collection Time: 02/24/23 10:29 PM   Specimen: Nasal Mucosa; Nasal Swab  Result Value Ref Range Status   MRSA, PCR NEGATIVE NEGATIVE Final   Staphylococcus aureus NEGATIVE NEGATIVE Final    Comment: (NOTE) The  Xpert SA Assay (FDA approved for NASAL specimens in patients 34 years of age and older), is one component  of a comprehensive surveillance program. It is not intended to diagnose infection nor to guide or monitor treatment. Performed at [facility], [address] [place], [place] [address]       Radiology Studies last 3 days: MR FOOT RIGHT WO CONTRAST  Result Date: 02/25/2023 CLINICAL DATA:  Osteonecrosis suspected, foot, xray done History of diabetes with polyneuropathy and previous toe amputations. Bilateral foot ulcerations. EXAM: MRI OF THE RIGHT FOREFOOT WITHOUT CONTRAST TECHNIQUE: Multiplanar, multisequence MR imaging of the right forefoot was performed. No intravenous contrast was administered. COMPARISON:  Radiographs 02/24/2023 and 02/18/2023.  MRI 07/09/2021. FINDINGS: Technical note: Coronal (long axis axial) T1 weighted images were mistakenly not performed. The short axis axial T1 weighted images were duplicated. Despite efforts by the technologist and patient, mild motion artifact is present on today's exam and could not be eliminated. This reduces exam sensitivity and specificity. Bones/Joint/Cartilage Since the previous MRI, the great toe has been amputated at the metatarsophalangeal joint. New nonspecific low-level T2 hyperintensity within the 1st metatarsal head without corresponding T1 marrow signal abnormality or cortical destruction. No other suspicious marrow edema. As demonstrated on recent radiographs, there is flattening of the 2nd, 3rd and 4th metatarsal heads which is new from the previous MRI and stable from recent radiographs. Associated cortical thickening of the 2nd metatarsal neck, widening of the 2nd metatarsophalangeal joint and probable 6 mm bony fragment within the joint (image 17/7). Mild midfoot degenerative changes. No significant joint effusions. Ligaments The Lisfranc ligament is intact. The collateral ligaments of the remaining metatarsophalangeal joints appear intact. Muscles and Tendons Generalized forefoot muscular atrophy. No focal intramuscular fluid  collection. No significant tenosynovitis. Soft tissues Irregular soft tissue ulcer extending distally from the amputated great toe with underlying ill-defined T2 hyperintensity within the soft tissues consistent with a phlegmon. No well-defined/drainable abscess. Generalized subcutaneous edema throughout the dorsal aspect of the forefoot. IMPRESSION: 1. Irregular soft tissue ulcer extending distally from the amputated great toe with underlying soft tissue phlegmon. No well-defined/drainable abscess. 2. New nonspecific low-level T2 hyperintensity within the 1st metatarsal head without corresponding T1 marrow signal abnormality or cortical destruction. This may be reactive, although given proximity to the overlying soft tissue findings is concerning for early osteomyelitis. 3. Flattening of the 2nd, 3rd and 4th metatarsal heads with widening of the 2nd metatarsophalangeal joint and probable 6 mm bony fragment within the joint. These findings are new from previous MRI and could be secondary to neuropathic arthropathy. No evidence of acute fracture. 4. Generalized subcutaneous edema throughout the dorsal aspect of the forefoot. Electronically Signed   By: Carey Bullocks M.D.   On: 02/25/2023 11:02   MR FOOT LEFT WO CONTRAST  Result Date: 02/25/2023 CLINICAL DATA:  Osteonecrosis suspected, foot, xray done History of diabetes with polyneuropathy and previous toe amputations. Bilateral foot ulcerations. EXAM: MRI OF THE LEFT FOOT WITHOUT CONTRAST TECHNIQUE: Multiplanar, multisequence MR imaging of the left forefoot was performed. No intravenous contrast was administered. COMPARISON:  Radiographs 02/18/2023 and 12/02/2022.  MRI 03/02/2020. FINDINGS: Bones/Joint/Cartilage Previous amputation of the great toe at the metatarsophalangeal joint. As seen on recent radiographs, there is new irregular joint space widening at the 5th metatarsophalangeal joint associated with underlying erosive changes and marrow edema within  the 5th metatarsal head and base of the 5th proximal phalanx. These findings are consistent with underlying osteomyelitis. T2 hyperintensity within the 5th metatarsal extends  to the proximal metadiaphysis, although the T1 marrow signal abnormality is isolated to the distal portion of the metatarsal. There is underlying chronic fracture through the 5th metatarsal neck. No other evidence of osteomyelitis, acute fracture or dislocation. Advanced arthropathy at the 2nd metatarsophalangeal joint with chronic flattening of the 2nd metatarsal head, stable from recent radiographs, but new from remote MRI. Mild midfoot degenerative changes without subluxation. No significant joint effusions. Ligaments Intact Lisfranc ligament. The collateral ligaments of the remaining metatarsophalangeal joints appear intact. Muscles and Tendons Generalized forefoot muscular atrophy without focal fluid collection. No significant tenosynovitis. Soft tissues Suspected soft tissue ulceration lateral to the 5th metatarsophalangeal joint with associated ill-defined fluid and diffuse soft tissue swelling. No focal drainable fluid collection identified. IMPRESSION: 1. Suspected soft tissue ulceration lateral to the 5th metatarsophalangeal joint with underlying osteomyelitis of the 5th metatarsal head and base of the 5th proximal phalanx, as described. 2. No focal drainable fluid collection identified. 3. Advanced arthropathy at the 2nd metatarsophalangeal joint with chronic flattening of the 2nd metatarsal head. 4. Previous amputation of the great toe at the metatarsophalangeal joint. Electronically Signed   By: Carey Bullocks M.D.   On: 02/25/2023 10:23   US Venous Img Lower Bilateral (DVT)  Result Date: 02/25/2023 CLINICAL DATA:  Bilateral lower extremity edema EXAM: BILATERAL LOWER EXTREMITY VENOUS DOPPLER ULTRASOUND TECHNIQUE: Gray-scale sonography with graded compression, as well as color Doppler and duplex ultrasound were performed to  evaluate the lower extremity deep venous systems from the level of the common femoral vein and including the common femoral, femoral, profunda femoral, popliteal and calf veins including the posterior tibial, peroneal and gastrocnemius veins when visible. The superficial great saphenous vein was also interrogated. Spectral Doppler was utilized to evaluate flow at rest and with distal augmentation maneuvers in the common femoral, femoral and popliteal veins. COMPARISON:  None Available. FINDINGS: RIGHT LOWER EXTREMITY Common Femoral Vein: No evidence of thrombus. Normal compressibility, respiratory phasicity and response to augmentation. Saphenofemoral Junction: No evidence of thrombus. Normal compressibility and flow on color Doppler imaging. Profunda Femoral Vein: No evidence of thrombus. Normal compressibility and flow on color Doppler imaging. Femoral Vein: No evidence of thrombus. Normal compressibility, respiratory phasicity and response to augmentation. Popliteal Vein: No evidence of thrombus. Normal compressibility, respiratory phasicity and response to augmentation. Calf Veins: No evidence of thrombus. Normal compressibility and flow on color Doppler imaging. Superficial Great Saphenous Vein: No evidence of thrombus. Normal compressibility. Venous Reflux:  None. Other Findings:  None. LEFT LOWER EXTREMITY Common Femoral Vein: No evidence of thrombus. Normal compressibility, respiratory phasicity and response to augmentation. Saphenofemoral Junction: No evidence of thrombus. Normal compressibility and flow on color Doppler imaging. Profunda Femoral Vein: No evidence of thrombus. Normal compressibility and flow on color Doppler imaging. Femoral Vein: No evidence of thrombus. Normal compressibility, respiratory phasicity and response to augmentation. Popliteal Vein: No evidence of thrombus. Normal compressibility, respiratory phasicity and response to augmentation. Calf Veins: No evidence of thrombus. Normal  compressibility and flow on color Doppler imaging. Superficial Great Saphenous Vein: No evidence of thrombus. Normal compressibility. Venous Reflux:  None. Other Findings:  None. IMPRESSION: No evidence of deep venous thrombosis in either lower extremity. Electronically Signed   By: Malachy Moan M.D.   On: 02/25/2023 10:04   DG Foot Complete Right  Result Date: 02/24/2023 CLINICAL DATA:  Worsening right foot wound EXAM: RIGHT FOOT COMPLETE - 3 VIEW COMPARISON:  Right foot radiographs dated 02/18/2023 FINDINGS: Postsurgical changes from amputation of the great toe.  Similar appearance of soft tissue wound at the amputation site. There is no evidence of fracture or dislocation. Similar cortical thickening of the second metatarsal shaft and widened second metatarsophalangeal joint space. No focal cortical lucency. Degenerative changes of the foot. Diffuse soft tissue edema of the forefoot. Soft tissues are unremarkable. IMPRESSION: 1. Postsurgical changes from amputation of the great toe. Similar appearance of soft tissue wound at the amputation site. 2. Similar cortical thickening of the second metatarsal shaft and widened second metatarsophalangeal joint space. No focal cortical lucency to suggest osteomyelitis. Electronically Signed   By: Agustin Cree M.D.   On: 02/24/2023 14:11         Sunnie Nielsen, DO [facility] 02/25/2023, 2:53 PM    Dictation software may have been used to generate the above note. Typos may occur and escape review in typed/dictated notes. Please contact Dr Lyn Hollingshead directly for clarity if needed.  Staff may message me via secure chat in Epic  but this may not receive an immediate response,  please page me for urgent matters!  If 7PM-7AM, please contact night coverage [email or web address]

## 2023-02-25 NOTE — H&P (Signed)
HISTORY AND PHYSICAL INTERVAL NOTE:  02/25/2023  11:56 AM  [name]  has presented today for surgery, with the diagnosis of osteomyelitis both feet, DFUs, uncontrolled DM2 with polyneuropathy.  The various methods of treatment have been discussed with the patient.  No guarantees were given.  After consideration of risks, benefits and other options for treatment, the patient has consented to surgery.  I have reviewed the patients' chart and labs.    A history and physical examination was performed in my office.  The patient was reexamined.  There have been no changes to this history and physical examination.  Rosetta Posner, DPM

## 2023-02-25 NOTE — Hospital Course (Addendum)
HPI: [name] is a 42 y.o. male with medical history significant of IDDM with insulin resistance, HTN, HLD, PVD, anxiety/depression, presented with worsening of right foot infection. Chronic L foot osteomyelitis, following with podiatry and wound care, chronic abx and barometric chamber. 7 days ago (+)new ulcer on the R foot plantar side of the forefoot, with rash, purulent discharge and swelling - started on antibiotics doxycycline 7 days ago but worsening, wound care sent him to ED   Hospital course / significant events:  10/02:  admitted to hospitalist service w/ podiatry consult. Started vanc, Cefepime, Flagyl. Podiatry did some debridement at bedside, planning for amputation tomorrow, pending MRI and ABI, pending DVT US  10/03: amputation R 1st toe and bx L 5th toe. ID consult - await cultures to guide abx deescalation 10/04: path and cultures still pending, likely will need IV abx but await results to determine   Consultants:  Podiatry  Infectious Disease   Procedures/Surgeries: 02/24/23: debridement of ulcers Right first metatarsal plantar, Left plantar first metatarsal, Left plantar fourth metatarsal phalangeal joint, Left lateral fifth metatarsal phalange joint - Dr Excell Seltzer, podiatry  02/25/23: Right partial first ray amputation with antibiotic bead application, and Left fifth metatarsal phalangeal joint bone biopsy      ASSESSMENT & PLAN:   Osteomyelitis - chronic L foot, potential new R foot - POA Right foot diabetic ulcer infection, failed outpatient management - POA High risk for limb loss due to multiple ulcerations, previous history of amputation, and current infection present.  S/p wound debridements at bedside by podiatry 02/24/23 S/p Right partial first ray amputation with antibiotic bead application, and Left fifth metatarsal phalangeal joint bone biopsy 02/25/23  MRI: pending DVT US: neg for DVT BL LE ABI: pending Podiatry following broad-spectrum  antibiotics of vancomycin, cefepime and Flagyl pending ID recs which are pending path/cx     Peripheral vascular disease ABI pending   IDDM with hyperglycemia Significant insulin resistance Continue Lantus Add SSI Hold off metformin Pt unable to afford some meds, will likely need to d/c on higher insulin regimen     Morbid obesity based on BMI and comorbid illnesses: Body mass index is 42.22 kg/m.   DVT prophylaxis: lovenox  IV fluids: no continuous IV fluids  Nutrition: carb modified diet  Central lines / invasive devices: none  Code Status: FULL CODE ACP documentation reviewed: 02/25/23 and none on file in [facility]  TOC needs: TBD may need HH/DME Barriers to dispo / significant pending items: ID consult, PT/OT eval, MRI and ABI results, podiatry clearance - possible d/c tomorrow pending abx recs

## 2023-02-25 NOTE — Inpatient Diabetes Management (Addendum)
Inpatient Diabetes Program Recommendations  AACE/ADA: New Consensus Statement on Inpatient Glycemic Control (2015)  Target Ranges:  Prepandial:   less than 140 mg/dL      Peak postprandial:   less than 180 mg/dL (1-2 hours)      Critically ill patients:  140 - 180 mg/dL    Latest Reference Range & Units 12/31/22 14:22  HbA1c, POC (controlled diabetic range) 0.0 - 7.0 % 11.9 !  !: Data is abnormal  Latest Reference Range & Units 02/24/23 16:11  Hemoglobin A1C 4.8 - 5.6 % 9.6 (H)  228 mg/dl  (H): Data is abnormally high  Latest Reference Range & Units 02/24/23 08:22 02/24/23 10:32 02/24/23 17:31 02/24/23 21:33  Glucose-Capillary 70 - 99 mg/dL 161 (H) 096 (H) 045 (H)  7 units Novolog  257 (H)  3 units Novolog  60 units Semglee  (H): Data is abnormally high  Latest Reference Range & Units 02/25/23 07:41  Glucose-Capillary 70 - 99 mg/dL 409 (H)  6 units Novolog   (H): Data is abnormally high   Admit with: Right foot infection/ Osteomyelitis   History: DM  Home DM Meds: Basaglar 60 units at HS       Metformin 2000 mg daily       Trulicity 0.75 mg Qweek (NOT taking)  Current Orders: Semglee 60 units at bedtime     Novolog Resistant Correction Scale/ SSI (0-20 units) TID AC + HS     NPO for I&D today  Started Novolog and Semglee insulins last PM  A1c has improved slightly from August  Will follow   Addendum 11:20am--Met w/ pt at bedside this AM prior to Surgery.  Pt told me he has CBG meter at home.  Checks 1-2 times per day but thinks his PCP would like for him to check more consistently at T[facility]  Has been taking the Basaglar and the Metformin but told me the cost of the Trulicity went up and he hasn't been taking that.  I asked pt to please contact PCP office to see if they want him on an alternative once weekly GLP.    Spoke with patient about his current A1c of 9.6% and how this is down from 11.9% in August.  Explained what an A1c is and what it measures.   Reminded patient that his goal A1c is 7% or less per ADA standards to prevent both acute and long-term complications.  Explained to patient the extreme importance of good glucose control at home.  Encouraged patient to check his CBGs TID AC and to record all CBGs in a logbook for his PCP to review.  Reviewed goal CBGs for home.     --Will follow patient during hospitalization--  Ambrose Finland RN, MSN, CDCES Diabetes Coordinator Inpatient Glycemic Control Team Team Pager: [phone] ([id number])

## 2023-02-26 ENCOUNTER — Encounter: Payer: Self-pay | Admitting: Podiatry

## 2023-02-26 ENCOUNTER — Encounter: Payer: 59 | Admitting: Physician Assistant

## 2023-02-26 ENCOUNTER — Other Ambulatory Visit (HOSPITAL_COMMUNITY): Payer: Self-pay

## 2023-02-26 ENCOUNTER — Inpatient Hospital Stay: Payer: 59

## 2023-02-26 DIAGNOSIS — M86271 Subacute osteomyelitis, right ankle and foot: Secondary | ICD-10-CM | POA: Diagnosis not present

## 2023-02-26 DIAGNOSIS — L089 Local infection of the skin and subcutaneous tissue, unspecified: Principal | ICD-10-CM

## 2023-02-26 LAB — GLUCOSE, CAPILLARY
Glucose-Capillary: 246 mg/dL — ABNORMAL HIGH (ref 70–99)
Glucose-Capillary: 213 mg/dL — ABNORMAL HIGH (ref 70–99)
Glucose-Capillary: 238 mg/dL — ABNORMAL HIGH (ref 70–99)
Glucose-Capillary: 197 mg/dL — ABNORMAL HIGH (ref 70–99)
Glucose-Capillary: 200 mg/dL — ABNORMAL HIGH (ref 70–99)

## 2023-02-26 MED ORDER — SODIUM CHLORIDE 0.9 % IV SOLN: INTRAVENOUS | Status: DC | PRN

## 2023-02-26 NOTE — Progress Notes (Signed)
PODIATRY / FOOT AND ANKLE SURGERY PROGRESS NOTE  Requesting Physician: Dr. Chipper Herb  Reason for consult: Foot ulcers both feet, infection right foot   HPI: [name] is a 42 y.o. male who presents resting in bed comfortably status post 1 day right partial first ray amputation with antibiotic bead application and a left fifth metatarsal bone biopsy.  Patient has tried to do his best to keep weight off the right foot and has been walking for very short distances with heel contact only with use of walker and postop shoe.  Physical therapy order has been placed but patient has been yet to be seen.  Patient currently denies nausea, vomiting, fevers, chills.  PMHx:  Past Medical History:  Diagnosis Date   ADHD    Anxiety    Bipolar disorder (HCC)    Depression    Diabetes mellitus without complication (HCC)    type 2   Heart murmur    as a child   HLD (hyperlipidemia)    Hypertension    no meds   Peripheral vascular disease (HCC)     Surgical Hx:  Past Surgical History:  Procedure Laterality Date   AMPUTATION Left 03/06/2020   Procedure: LEFT GREAT TOE AMPUTATION;  Surgeon: [name], MD;  Location: [facility] OR;  Service: Orthopedics;  Laterality: Left;   AMPUTATION Right 10/15/2021   Procedure: RIGHT GREAT TOE AMPUTATION;  Surgeon: [name], MD;  Location: [facility] OR;  Service: Orthopedics;  Laterality: Right;   BONE BIOPSY Left 02/25/2023   Procedure: LEFT FIFTH METATARSAL BONE BIOPSY;  Surgeon: Rosetta Posner, DPM;  Location: [facility] ORS;  Service: Podiatry;  Laterality: Left;   EYE SURGERY Bilateral    lasik   TRANSMETATARSAL AMPUTATION Right 02/25/2023   Procedure: RIGHT PARTIAL FIRST RAY AMPUTATION WITH ANTIBIOTIC BEAD APPLICATION;  Surgeon: Rosetta Posner, DPM;  Location: [facility] ORS;  Service: Podiatry;  Laterality: Right;   WISDOM TOOTH EXTRACTION      FHx:  Family History  Problem Relation Age of Onset   Diabetes Mother    Hyperlipidemia Father    Hypertension Father      Social History:  reports that he has never smoked. He has never used smokeless tobacco. He reports that he does not currently use alcohol. He reports that he does not use drugs.  Allergies:  Allergies  Allergen Reactions   Milk-Related Compounds Diarrhea   Medications Prior to Admission  Medication Sig Dispense Refill   atomoxetine (STRATTERA) 80 MG capsule Take 1 capsule (80 mg total) by mouth daily. 30 capsule 0   doxycycline (MONODOX) 100 MG capsule Take 1 capsule (100 mg total) by mouth 2 (two) times daily for 7 days 14 capsule 0   Insulin Glargine (BASAGLAR KWIKPEN) 100 UNIT/ML Inject 60 Units into the skin at bedtime. 15 mL 6   metFORMIN (GLUCOPHAGE-XR) 500 MG 24 hr tablet Take 4 tablets (2,000 mg total) by mouth daily with breakfast. 360 tablet 1   pravastatin (PRAVACHOL) 40 MG tablet Take 1 tablet (40 mg total) by mouth once daily. 90 tablet 1   sertraline (ZOLOFT) 50 MG tablet Take 1 tablet (50 mg total) by mouth daily. 30 tablet 3   blood glucose meter kit and supplies Dispense based on patient and insurance preference. Use up to four times daily as directed. (FOR ICD-10 E10.9, E11.9). 1 each 0   ciprofloxacin (CIPRO) 500 MG tablet Take 1 tablet (500 mg total) by mouth 2 (two) times daily for 10 days (Patient not  taking: Reported on 02/24/2023) 20 tablet 0   doxycycline (VIBRA-TABS) 100 MG tablet Take 1 tablet (100 mg total) by mouth 2 (two) times daily for 14 days (Patient not taking: Reported on 02/24/2023) 28 tablet 0   Dulaglutide (TRULICITY) 0.75 MG/0.5ML SOPN Inject 0.75 mg into the skin once a week. (Patient not taking: Reported on 02/24/2023) 2 mL 6   Insulin Pen Needle (PEN NEEDLES) 31G X 8 MM MISC UAD 100 each 6    Physical Exam: General: Alert and oriented.  No apparent distress.  Vascular: DP/PT pulses palpable, capillary fill time intact to digits bilaterally.  Hair growth present to bilateral lower extremities.  Neuro: Light touch sensation reduced to nearly  absent to bilateral lower extremities.  Derm: Right partial first ray amputation site appears to be well coapted with sutures intact, mild edema, reduced erythema present overall, no active drainage, appears to be improving.   Open ulcerations present also to the left foot including the plantar first metatarsal head which measures approximately 0.5 x 0.5 x 0.2 cm cm, fourth metatarsal phalange joint plantar which measures approximately 0.5 x 1 cm -dermal tissue, fifth metatarsal phalange joint lateral which measures 1 cm x 0.2 cm x 0.5 cm, no bone exposed to these areas and no corresponding erythema or edema, minimal serous drainage, no signs of infection to the left foot.  MSK: Hallux amputations bilaterally  Results for orders placed or performed during the hospital encounter of 02/24/23 (from the past 48 hour(s))  Blood culture (single)     Status: None (Preliminary result)   Collection Time: 02/24/23  2:56 PM   Specimen: BLOOD  Result Value Ref Range   Specimen Description BLOOD BLOOD LEFT FOREARM    Special Requests      BOTTLES DRAWN AEROBIC AND ANAEROBIC Blood Culture adequate volume   Culture      NO GROWTH 2 DAYS Performed at [facility], [address], [place], [place] [address]    Report Status PENDING   Lactic acid, plasma     Status: None   Collection Time: 02/24/23  2:58 PM  Result Value Ref Range   Lactic Acid, Venous 1.6 0.5 - 1.9 mmol/L    Comment: Performed at [facility], [address] [place], [place] [address]  Urinalysis, w/ Reflex to Culture (Infection Suspected) -Urine, Clean Catch     Status: Abnormal   Collection Time: 02/24/23  4:11 PM  Result Value Ref Range   Specimen Source URINE, CLEAN CATCH    Color, Urine YELLOW (A) YELLOW   APPearance HAZY (A) CLEAR   Specific Gravity, Urine 1.033 (H) 1.005 - 1.030   pH 5.0 5.0 - 8.0   Glucose, UA >=500 (A) NEGATIVE mg/dL   Hgb urine dipstick NEGATIVE NEGATIVE   Bilirubin Urine NEGATIVE  NEGATIVE   Ketones, ur 20 (A) NEGATIVE mg/dL   Protein, ur NEGATIVE NEGATIVE mg/dL   Nitrite NEGATIVE NEGATIVE   Leukocytes,Ua NEGATIVE NEGATIVE   RBC / HPF 0-5 0 - 5 RBC/hpf   WBC, UA 0-5 0 - 5 WBC/hpf    Comment:        Reflex urine culture not performed if WBC <=10, OR if Squamous epithelial cells >5. If Squamous epithelial cells >5 suggest recollection.    Bacteria, UA NONE SEEN NONE SEEN   Squamous Epithelial / HPF 0-5 0 - 5 /HPF   Mucus PRESENT    Hyaline Casts, UA PRESENT     Comment: Performed at [facility], [address] [place], [place] [address]  HIV Antibody (routine testing w rflx)     Status: None   Collection Time: 02/24/23  4:11 PM  Result Value Ref Range   HIV Screen 4th Generation wRfx Non Reactive Non Reactive    Comment: Performed at [facility], [address], [place], [place] [address]  Hemoglobin A1c     Status: Abnormal   Collection Time: 02/24/23  4:11 PM  Result Value Ref Range   Hgb A1c MFr Bld 9.6 (H) 4.8 - 5.6 %    Comment: (NOTE) Pre diabetes:          5.7%-6.4%  Diabetes:              >6.4%  Glycemic control for   <7.0% adults with diabetes    Mean Plasma Glucose 228.82 mg/dL    Comment: Performed at [facility], [address] [place], [place] [address]  Glucose, capillary     Status: Abnormal   Collection Time: 02/24/23  5:31 PM  Result Value Ref Range   Glucose-Capillary 228 (H) 70 - 99 mg/dL    Comment: Glucose reference range applies only to samples taken after fasting for at least 8 hours.  Glucose, capillary     Status: Abnormal   Collection Time: 02/24/23  9:33 PM  Result Value Ref Range   Glucose-Capillary 257 (H) 70 - 99 mg/dL    Comment: Glucose reference range applies only to samples taken after fasting for at least 8 hours.  Surgical pcr screen     Status: None   Collection Time: 02/24/23 10:29 PM   Specimen: Nasal Mucosa; Nasal Swab  Result Value Ref Range   MRSA, PCR NEGATIVE NEGATIVE    Staphylococcus aureus NEGATIVE NEGATIVE    Comment: (NOTE) The Xpert SA Assay (FDA approved for NASAL specimens in patients 15 years of age and older), is one component of a comprehensive surveillance program. It is not intended to diagnose infection nor to guide or monitor treatment. Performed at [facility], [address] [place], [place] [address]   CBC     Status: Abnormal   Collection Time: 02/25/23  3:25 AM  Result Value Ref Range   WBC 11.3 (H) 4.0 - 10.5 K/uL   RBC 4.98 4.22 - 5.81 MIL/uL   Hemoglobin 13.6 13.0 - 17.0 g/dL   HCT 02.7 25.3 - 66.4 %   MCV 81.5 80.0 - 100.0 fL   MCH 27.3 26.0 - 34.0 pg   MCHC 33.5 30.0 - 36.0 g/dL   RDW 40.3 47.4 - 25.9 %   Platelets 290 150 - 400 K/uL   nRBC 0.0 0.0 - 0.2 %    Comment: Performed at [facility], [address], [place], [place] [address]  Basic metabolic panel     Status: Abnormal   Collection Time: 02/25/23  3:25 AM  Result Value Ref Range   Sodium 136 135 - 145 mmol/L   Potassium 4.2 3.5 - 5.1 mmol/L   Chloride 104 98 - 111 mmol/L   CO2 25 22 - 32 mmol/L   Glucose, Bld 204 (H) 70 - 99 mg/dL    Comment: Glucose reference range applies only to samples taken after fasting for at least 8 hours.   BUN 13 6 - 20 mg/dL   Creatinine, Ser 5.64 0.61 - 1.24 mg/dL   Calcium 8.5 (L) 8.9 - 10.3 mg/dL   GFR, Estimated >33 >29 mL/min    Comment: (NOTE)  Calculated using the CKD-EPI Creatinine Equation (2021)    Anion gap 7 5 - 15    Comment: Performed at [facility], [address] [place], [place] [address]  Glucose, capillary     Status: Abnormal   Collection Time: 02/25/23  7:41 AM  Result Value Ref Range   Glucose-Capillary 189 (H) 70 - 99 mg/dL    Comment: Glucose reference range applies only to samples taken after fasting for at least 8 hours.  Glucose, capillary     Status: Abnormal   Collection Time: 02/25/23 11:27 AM  Result Value Ref Range   Glucose-Capillary 179 (H) 70 - 99 mg/dL     Comment: Glucose reference range applies only to samples taken after fasting for at least 8 hours.  Aerobic/Anaerobic Culture w Gram Stain (surgical/deep wound)     Status: None (Preliminary result)   Collection Time: 02/25/23  1:11 PM   Specimen: Path fluid; Body Fluid  Result Value Ref Range   Specimen Description      WOUND Performed at [facility], [address], [place], [place] [address]    Special Requests      FLDP Performed at [facility], [address] [place], [place] [address]    Gram Stain      RARE WBC PRESENT, PREDOMINANTLY PMN FEW GRAM POSITIVE COCCI    Culture      TOO YOUNG TO READ Performed at [facility], [address], [place], [place] [address]    Report Status PENDING   Aerobic/Anaerobic Culture w Gram Stain (surgical/deep wound)     Status: None (Preliminary result)   Collection Time: 02/25/23  1:18 PM   Specimen: Path Tissue  Result Value Ref Range   Specimen Description      TISSUE Performed at [facility], [address], [place], [place] [address]    Special Requests      NONE Performed at [facility], [address] [place], [place] [address]    Gram Stain NO WBC SEEN NO ORGANISMS SEEN     Culture      NO GROWTH < 12 HOURS Performed at [facility], [address], [place], [place] [address]    Report Status PENDING   Aerobic/Anaerobic Culture w Gram Stain (surgical/deep wound)     Status: None (Preliminary result)   Collection Time: 02/25/23  1:26 PM   Specimen: Path Tissue  Result Value Ref Range   Specimen Description      WOUND Performed at [name], [address], [place], [place] [address]    Special Requests      TISP Performed at [facility], [address] [place], [place] [address]    Gram Stain NO WBC SEEN NO ORGANISMS SEEN     Culture      NO GROWTH < 12 HOURS Performed at [facility], [address] [place],  [place] [address]    Report Status PENDING   Glucose, capillary     Status: Abnormal   Collection Time: 02/25/23  1:51 PM  Result Value Ref Range   Glucose-Capillary 185 (H) 70 - 99 mg/dL    Comment: Glucose reference range applies only to samples taken after fasting for at least 8 hours.  Glucose, capillary     Status: Abnormal   Collection Time: 02/25/23  4:28 PM  Result Value Ref Range   Glucose-Capillary 222 (H) 70 - 99 mg/dL  Comment: Glucose reference range applies only to samples taken after fasting for at least 8 hours.  Glucose, capillary     Status: Abnormal   Collection Time: 02/25/23  8:40 PM  Result Value Ref Range   Glucose-Capillary 246 (H) 70 - 99 mg/dL    Comment: Glucose reference range applies only to samples taken after fasting for at least 8 hours.   Comment 1 Notify RN   Glucose, capillary     Status: Abnormal   Collection Time: 02/26/23  8:03 AM  Result Value Ref Range   Glucose-Capillary 213 (H) 70 - 99 mg/dL    Comment: Glucose reference range applies only to samples taken after fasting for at least 8 hours.  Glucose, capillary     Status: Abnormal   Collection Time: 02/26/23 12:30 PM  Result Value Ref Range   Glucose-Capillary 238 (H) 70 - 99 mg/dL    Comment: Glucose reference range applies only to samples taken after fasting for at least 8 hours.   US ARTERIAL ABI (SCREENING LOWER EXTREMITY)  Result Date: 02/26/2023 CLINICAL DATA:  Diabetes with left fifth toe osteomyelitis and status post right first toe amputation for infection. EXAM: NONINVASIVE PHYSIOLOGIC VASCULAR STUDY OF BILATERAL LOWER EXTREMITIES TECHNIQUE: Evaluation of both lower extremities were performed at rest, including calculation of ankle-brachial indices with single level Doppler, pressure and pulse volume recording. COMPARISON:  None Available. FINDINGS: Right ABI:  1.25 Left ABI:  1.22 Right Lower Extremity: Normal triphasic posterior tibial and dorsalis pedis arterial waveforms at the ankle.  Left Lower Extremity: Normal posterior tibial and dorsalis pedis triphasic arterial waveforms at the ankle. 1.0-1.4 Normal IMPRESSION: Normal resting ankle-brachial indices bilaterally and normal triphasic distal waveforms at the level of the posterior tibial and dorsalis pedis arteries. Electronically Signed   By: Irish Lack M.D.   On: 02/26/2023 11:05   DG Foot Complete Right  Result Date: 02/25/2023 CLINICAL DATA:  Postoperative check. Status post partial first ray amputation. EXAM: RIGHT FOOT COMPLETE - 3+ VIEW COMPARISON:  Right foot radiographs 02/24/2023 and MRI right forefoot 02/24/2023 FINDINGS: Interval progressive amputation of the first ray, now to the distal shaft of the first metatarsal. There are numerous antibiotic beads overlying the distal aspect the remaining first metatarsal on the distal soft tissues. No significant change in flattening of the second metatarsal head and widening of the second metatarsophalangeal joint space. Mild third metatarsophalangeal joint space narrowing and lateral spurring. Moderate to severe plantar and moderate posterior calcaneal heel spurs. Mild-to-moderate dorsal midfoot degenerative osteophytes. IMPRESSION: Interval progressive amputation of the first ray, now to the distal shaft of the first metatarsal. No unexpected findings. Electronically Signed   By: Neita Garnet M.D.   On: 02/25/2023 15:07   MR FOOT RIGHT WO CONTRAST  Result Date: 02/25/2023 CLINICAL DATA:  Osteonecrosis suspected, foot, xray done History of diabetes with polyneuropathy and previous toe amputations. Bilateral foot ulcerations. EXAM: MRI OF THE RIGHT FOREFOOT WITHOUT CONTRAST TECHNIQUE: Multiplanar, multisequence MR imaging of the right forefoot was performed. No intravenous contrast was administered. COMPARISON:  Radiographs 02/24/2023 and 02/18/2023.  MRI 07/09/2021. FINDINGS: Technical note: Coronal (long axis axial) T1 weighted images were mistakenly not performed. The short  axis axial T1 weighted images were duplicated. Despite efforts by the technologist and patient, mild motion artifact is present on today's exam and could not be eliminated. This reduces exam sensitivity and specificity. Bones/Joint/Cartilage Since the previous MRI, the great toe has been amputated at the metatarsophalangeal joint. New nonspecific low-level T2  hyperintensity within the 1st metatarsal head without corresponding T1 marrow signal abnormality or cortical destruction. No other suspicious marrow edema. As demonstrated on recent radiographs, there is flattening of the 2nd, 3rd and 4th metatarsal heads which is new from the previous MRI and stable from recent radiographs. Associated cortical thickening of the 2nd metatarsal neck, widening of the 2nd metatarsophalangeal joint and probable 6 mm bony fragment within the joint (image 17/7). Mild midfoot degenerative changes. No significant joint effusions. Ligaments The Lisfranc ligament is intact. The collateral ligaments of the remaining metatarsophalangeal joints appear intact. Muscles and Tendons Generalized forefoot muscular atrophy. No focal intramuscular fluid collection. No significant tenosynovitis. Soft tissues Irregular soft tissue ulcer extending distally from the amputated great toe with underlying ill-defined T2 hyperintensity within the soft tissues consistent with a phlegmon. No well-defined/drainable abscess. Generalized subcutaneous edema throughout the dorsal aspect of the forefoot. IMPRESSION: 1. Irregular soft tissue ulcer extending distally from the amputated great toe with underlying soft tissue phlegmon. No well-defined/drainable abscess. 2. New nonspecific low-level T2 hyperintensity within the 1st metatarsal head without corresponding T1 marrow signal abnormality or cortical destruction. This may be reactive, although given proximity to the overlying soft tissue findings is concerning for early osteomyelitis. 3. Flattening of the 2nd,  3rd and 4th metatarsal heads with widening of the 2nd metatarsophalangeal joint and probable 6 mm bony fragment within the joint. These findings are new from previous MRI and could be secondary to neuropathic arthropathy. No evidence of acute fracture. 4. Generalized subcutaneous edema throughout the dorsal aspect of the forefoot. Electronically Signed   By: Carey Bullocks M.D.   On: 02/25/2023 11:02   MR FOOT LEFT WO CONTRAST  Result Date: 02/25/2023 CLINICAL DATA:  Osteonecrosis suspected, foot, xray done History of diabetes with polyneuropathy and previous toe amputations. Bilateral foot ulcerations. EXAM: MRI OF THE LEFT FOOT WITHOUT CONTRAST TECHNIQUE: Multiplanar, multisequence MR imaging of the left forefoot was performed. No intravenous contrast was administered. COMPARISON:  Radiographs 02/18/2023 and 12/02/2022.  MRI 03/02/2020. FINDINGS: Bones/Joint/Cartilage Previous amputation of the great toe at the metatarsophalangeal joint. As seen on recent radiographs, there is new irregular joint space widening at the 5th metatarsophalangeal joint associated with underlying erosive changes and marrow edema within the 5th metatarsal head and base of the 5th proximal phalanx. These findings are consistent with underlying osteomyelitis. T2 hyperintensity within the 5th metatarsal extends to the proximal metadiaphysis, although the T1 marrow signal abnormality is isolated to the distal portion of the metatarsal. There is underlying chronic fracture through the 5th metatarsal neck. No other evidence of osteomyelitis, acute fracture or dislocation. Advanced arthropathy at the 2nd metatarsophalangeal joint with chronic flattening of the 2nd metatarsal head, stable from recent radiographs, but new from remote MRI. Mild midfoot degenerative changes without subluxation. No significant joint effusions. Ligaments Intact Lisfranc ligament. The collateral ligaments of the remaining metatarsophalangeal joints appear intact.  Muscles and Tendons Generalized forefoot muscular atrophy without focal fluid collection. No significant tenosynovitis. Soft tissues Suspected soft tissue ulceration lateral to the 5th metatarsophalangeal joint with associated ill-defined fluid and diffuse soft tissue swelling. No focal drainable fluid collection identified. IMPRESSION: 1. Suspected soft tissue ulceration lateral to the 5th metatarsophalangeal joint with underlying osteomyelitis of the 5th metatarsal head and base of the 5th proximal phalanx, as described. 2. No focal drainable fluid collection identified. 3. Advanced arthropathy at the 2nd metatarsophalangeal joint with chronic flattening of the 2nd metatarsal head. 4. Previous amputation of the great toe at the metatarsophalangeal joint. Electronically  Signed   By: Carey Bullocks M.D.   On: 02/25/2023 10:23   US Venous Img Lower Bilateral (DVT)  Result Date: 02/25/2023 CLINICAL DATA:  Bilateral lower extremity edema EXAM: BILATERAL LOWER EXTREMITY VENOUS DOPPLER ULTRASOUND TECHNIQUE: Gray-scale sonography with graded compression, as well as color Doppler and duplex ultrasound were performed to evaluate the lower extremity deep venous systems from the level of the common femoral vein and including the common femoral, femoral, profunda femoral, popliteal and calf veins including the posterior tibial, peroneal and gastrocnemius veins when visible. The superficial great saphenous vein was also interrogated. Spectral Doppler was utilized to evaluate flow at rest and with distal augmentation maneuvers in the common femoral, femoral and popliteal veins. COMPARISON:  None Available. FINDINGS: RIGHT LOWER EXTREMITY Common Femoral Vein: No evidence of thrombus. Normal compressibility, respiratory phasicity and response to augmentation. Saphenofemoral Junction: No evidence of thrombus. Normal compressibility and flow on color Doppler imaging. Profunda Femoral Vein: No evidence of thrombus. Normal  compressibility and flow on color Doppler imaging. Femoral Vein: No evidence of thrombus. Normal compressibility, respiratory phasicity and response to augmentation. Popliteal Vein: No evidence of thrombus. Normal compressibility, respiratory phasicity and response to augmentation. Calf Veins: No evidence of thrombus. Normal compressibility and flow on color Doppler imaging. Superficial Great Saphenous Vein: No evidence of thrombus. Normal compressibility. Venous Reflux:  None. Other Findings:  None. LEFT LOWER EXTREMITY Common Femoral Vein: No evidence of thrombus. Normal compressibility, respiratory phasicity and response to augmentation. Saphenofemoral Junction: No evidence of thrombus. Normal compressibility and flow on color Doppler imaging. Profunda Femoral Vein: No evidence of thrombus. Normal compressibility and flow on color Doppler imaging. Femoral Vein: No evidence of thrombus. Normal compressibility, respiratory phasicity and response to augmentation. Popliteal Vein: No evidence of thrombus. Normal compressibility, respiratory phasicity and response to augmentation. Calf Veins: No evidence of thrombus. Normal compressibility and flow on color Doppler imaging. Superficial Great Saphenous Vein: No evidence of thrombus. Normal compressibility. Venous Reflux:  None. Other Findings:  None. IMPRESSION: No evidence of deep venous thrombosis in either lower extremity. Electronically Signed   By: Malachy Moan M.D.   On: 02/25/2023 10:04    Blood pressure 112/69, pulse 72, temperature 98.6 F (37 C), resp. rate 16, height 6\' 1"  (1.854 m), weight (!) 145.2 kg, SpO2 99%.   Assessment Osteomyelitis right first metatarsal with associated diabetic foot ulceration and cellulitis with as post partial first ray amputation with antibiotic bead application Stable osteomyelitis of the left 5th MTPJ status post bone biopsy Multiple diabetic foot ulcerations to the left foot including plantar first metatarsal,  fourth metatarsal phalange joint, and lateral fifth metatarsal phalangeal joint Diabetes type 2 with polyneuropathy, uncontrolled  Plan -Patient seen and examined. -X-ray imaging reviewed.   -ABIs within normal limits. -Incision to the right foot appears to be well coapted with sutures intact, no dehiscence, reduced erythema and edema present overall, appears to be improving. -Left foot wounds appear to be stable at this time.  No signs of infection present clinically. -Redressed right foot with Xeroform to the incisional area followed by 4 x 4 gauze, ABD pad, Kerlix, Ace wrap.  Patient to keep this clean, dry, and intact for the next week until outpatient visit. -Patient to have dressing changes performed every other day at the left foot wounds consisting of bacitracin to all wounds followed by 4 x 4 gauze, gauze roll, Ace wrap.  May be weightbearing as tolerated the left foot.  To the right foot should be partial  weightbearing with heel contact for short distances. -Discussed bone culture results so far not growing any bacteria at the closing the wound culture for the right first metatarsal and the bone culture that was taken to the left fifth metatarsal.  Pathology still pending.  Wound culture growing gram-positive cocci.  Appreciate infectious disease recommendations for antibiotics.  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 -Patient is at high risk for limb loss due to multiple ulcerations, previous history of amputation, and current infection present.  Podiatry team to follow-up more peripherally at this time.  Believe patient is likely stable for discharge after antibiotic recommendations.  Patient may follow-up in outpatient clinic 1 week after discharge.  Rosetta Posner, DPM 02/26/2023, 1:30 PM

## 2023-02-26 NOTE — Plan of Care (Signed)
  Problem: Activity: Goal: Risk for activity intolerance will decrease Outcome: Progressing   

## 2023-02-26 NOTE — Consult Note (Signed)
Pharmacy Antibiotic Note  [name] is a 42 y.o. male admitted on 02/24/2023 with wound infection. 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 Patient has chronic osteomyelitis of the left foot and now there is concern for osteomyelitis in the right foot. Pharmacy has been consulted for cefepime and vancomycin dosing. Right partial first ray amputation with antibiotic bead application and left fifth metatarsal phalangeal joint bone biopsy performed 02/25/23.  Today, 02/26/2023 Day 3 of antibiotics  WBC trending down Afebrile  SCr stable  Plan: Continue vancomycin 2000 mg IV Q12H Goal AUC 400-550  Estimated AUC 474.6 Estimated Cmin 11.5 SCr 0.8 (actual 0.68), IBW, Vd 0.5 (BMI 42) Consider obtaining vancomycin level tomorrow if patient is to remain on vancomycin  Continue cefepime 2 grams IV Q8H Patient also on metronidazole 500 mg IV Q12H Pharmacy will continue to monitor and dose adjust appropriately   Height: 6\' 1"  (185.4 cm) Weight: (!) 145.2 kg (320 lb 1.7 oz) IBW/kg (Calculated) : 79.9  Temp (24hrs), Avg:97.8 F (36.6 C), Min:97.2 F (36.2 C), Max:98.6 F (37 C)  Recent Labs  Lab 02/24/23 1244 02/24/23 1458 02/25/23 0325  WBC 13.7*  --  11.3*  CREATININE 0.82  --  0.68  LATICACIDVEN 2.4* 1.6  --     Estimated Creatinine Clearance: 178.5 mL/min (by C-G formula based on SCr of 0.68 mg/dL).    Allergies  Allergen Reactions   Milk-Related Compounds Diarrhea   Antimicrobials this admission: Vancomycin 10/2 >>  Cefepime 10/2 >>  Flagyl 10/2 >>  Dose adjustments this admission: N/A  Microbiology results: 10/2 BCx: NG x 2d 10/2 MRSA nares: negative 10/3 Wcx: GPCs on gram stain  10/3 Tissue cx: NG  Thank you for allowing pharmacy to be a part of this patient's care.  Littie Deeds, PharmD PGY1 Pharmacy Resident 02/26/2023 10:34 AM

## 2023-02-26 NOTE — Inpatient Diabetes Management (Signed)
Inpatient Diabetes Program Recommendations  AACE/ADA: New Consensus Statement on Inpatient Glycemic Control (2015)  Target Ranges:  Prepandial:   less than 140 mg/dL      Peak postprandial:   less than 180 mg/dL (1-2 hours)      Critically ill patients:  140 - 180 mg/dL    Latest Reference Range & Units 02/25/23 07:41 02/25/23 11:27 02/25/23 13:51 02/25/23 16:28 02/25/23 20:40  Glucose-Capillary 70 - 99 mg/dL 914 (H) 782 (H) 956 (H) 222 (H) 246 (H)  (H): Data is abnormally high  Latest Reference Range & Units 02/26/23 08:03 02/26/23 12:30  Glucose-Capillary 70 - 99 mg/dL 213 (H) 086 (H)  (H): Data is abnormally high   Admit with: Right foot infection/ Osteomyelitis    History: DM   Home DM Meds: Basaglar 60 units at HS                             Metformin 2000 mg daily                             Trulicity 0.75 mg Qweek (NOT taking)   Current Orders: Semglee 60 units at bedtime                           Novolog Resistant Correction Scale/ SSI (0-20 units) TID AC + HS    MD- Note pt got Decadron 5 mg X 1 dose yest at 12:40pm  CBGs >200 today  May consider adding Novolog Meal Coverage: Novolog 4 units TID with meals HOLD if pt NPO HOLD if pt eats <50% meals    --Will follow patient during hospitalization--  Ambrose Finland RN, MSN, CDCES Diabetes Coordinator Inpatient Glycemic Control Team Team Pager: [phone] (8a-5p)

## 2023-02-26 NOTE — Progress Notes (Signed)
ID Pt doing better No specific complaint O/e Awake and alert No distress BP 124/73 (BP Location: Right Arm)   Pulse 75   Temp (!) 97 F (36.1 C)   Resp 16   Ht 6\' 1"  (1.854 m)   Wt (!) 145.2 kg   SpO2 100%   BMI 42.23 kg/m    Hss1s2 Chest b/l air entry  Rt foot picture reviewed   Pre surgery     Labs    Latest Ref Rng & Units 02/25/2023    3:25 AM 02/24/2023   12:44 PM 12/31/2022    3:19 PM  CBC  WBC 4.0 - 10.5 K/uL 11.3  13.7  9.1   Hemoglobin 13.0 - 17.0 g/dL 09.8  11.9  14.7   Hematocrit 39.0 - 52.0 % 40.6  45.9  48.2   Platelets 150 - 400 K/uL 290  341  329        Latest Ref Rng & Units 02/25/2023    3:25 AM 02/24/2023   12:44 PM 12/31/2022    3:19 PM  CMP  Glucose 70 - 99 mg/dL 829  562  130   BUN 6 - 20 mg/dL 13  14  10    Creatinine 0.61 - 1.24 mg/dL 8.65  7.84  6.96   Sodium 135 - 145 mmol/L 136  134  137   Potassium 3.5 - 5.1 mmol/L 4.2  4.1  4.2   Chloride 98 - 111 mmol/L 104  97  98   CO2 22 - 32 mmol/L 25  25  24    Calcium 8.9 - 10.3 mg/dL 8.5  9.2  9.5   Total Protein 6.5 - 8.1 g/dL  9.2  7.8   Total Bilirubin 0.3 - 1.2 mg/dL  1.7  0.5   Alkaline Phos 38 - 126 U/L  95  120   AST 15 - 41 U/L  13  22   ALT 0 - 44 U/L  15  20      Impression/recommendation Diabetic foot infection complicated by peripheral neuropathy and PVD Rt great toe amputation site ws infected and underlying bone osteomyelitis s/p surgery partial ray excision- sent for pathology and culture On vanco, cefepime and flagyl Await culture to deescalate and decide on duration and route   Multiple ulcers both feet   DM- not well controlled   PVD   Bipolar disorder ADHD ? Discussed the management with the patient and hospitalist in detail ON call ID available this weekend by phone for urgent issues

## 2023-02-26 NOTE — TOC Benefit Eligibility Note (Signed)
Patient Product/process development scientist completed.    The patient is insured through [employer]. Patient has [employer], may use a copay card, and/or apply for patient assistance if available.    Ran test claim for Jardiance 10 mg and the current 30 day co-pay is $0.00.  Ran test claim for Farxiga 5 mg and Non Formulary   This test claim was processed through [employer]- copay amounts may vary at other pharmacies due to [employer], or as the patient moves through the different stages of their insurance plan.     Roland Earl, CPHT Pharmacy Technician III Certified Patient Advocate [facility] Patient Advocate Team Direct Number: [phone]  Fax: [phone]

## 2023-02-26 NOTE — Progress Notes (Signed)
PROGRESS NOTE    [name]   NFA:[id number] DOB: 1980/09/15  DOA: 02/24/2023 Date of Service: 02/26/23 which is hospital day 2  PCP: Marcine Matar, MD    HPI: [name] is a 42 y.o. male with medical history significant of IDDM with insulin resistance, HTN, HLD, PVD, anxiety/depression, presented with worsening of right foot infection. Chronic L foot osteomyelitis, following with podiatry and wound care, chronic abx and barometric chamber. 7 days ago (+)new ulcer on the R foot plantar side of the forefoot, with rash, purulent discharge and swelling - started on antibiotics doxycycline 7 days ago but worsening, wound care sent him to ED   Hospital course / significant events:  10/02:  admitted to hospitalist service w/ podiatry consult. Started vanc, Cefepime, Flagyl. Podiatry did some debridement at bedside, planning for amputation tomorrow, pending MRI and ABI, pending DVT US  10/03: amputation R 1st toe and bx L 5th toe. ID consult - await cultures to guide abx deescalation 10/04: path and cultures still pending, likely will need IV abx but await results to determine   Consultants:  Podiatry  Infectious Disease   Procedures/Surgeries: 02/24/23: debridement of ulcers Right first metatarsal plantar, Left plantar first metatarsal, Left plantar fourth metatarsal phalangeal joint, Left lateral fifth metatarsal phalange joint - Dr Excell Seltzer, podiatry  02/25/23: Right partial first ray amputation with antibiotic bead application, and Left fifth metatarsal phalangeal joint bone biopsy      ASSESSMENT & PLAN:   Osteomyelitis - chronic L foot, potential new R foot - POA Right foot diabetic ulcer infection, failed outpatient management - POA High risk for limb loss due to multiple ulcerations, previous history of amputation, and current infection present.  S/p wound debridements at bedside by podiatry 02/24/23 S/p Right partial first ray amputation with antibiotic bead  application, and Left fifth metatarsal phalangeal joint bone biopsy 02/25/23  MRI: pending DVT US: neg for DVT BL LE ABI: pending Podiatry following broad-spectrum antibiotics of vancomycin, cefepime and Flagyl pending ID recs which are pending path/cx     Peripheral vascular disease ABI pending   IDDM with hyperglycemia Significant insulin resistance Continue Lantus Add SSI Hold off metformin Pt unable to afford some meds, will likely need to d/c on higher insulin regimen     Morbid obesity based on BMI and comorbid illnesses: Body mass index is 42.22 kg/m.   DVT prophylaxis: lovenox  IV fluids: no continuous IV fluids  Nutrition: carb modified diet  Central lines / invasive devices: none  Code Status: FULL CODE ACP documentation reviewed: 02/25/23 and none on file in [facility]  TOC needs: TBD may need HH/DME Barriers to dispo / significant pending items: ID consult, PT/OT eval, MRI and ABI results, podiatry clearance - possible d/c tomorrow pending abx recs               Subjective / Brief ROS:  Patient reports doing well today Frustrated about insurance not covering all his meds (GLP1) Denies CP/SOB.  Pain controlled.  Denies new weakness.  Tolerating diet.  Reports no concerns w/ urination/defecation.   Family Communication: family at bedside on rounds     Objective Findings:  Vitals:   02/26/23 0057 02/26/23 0359 02/26/23 0753 02/26/23 1619  BP: 128/70 120/71 112/69 124/73  Pulse: 75 74 72 75  Resp: 18 16 16 16   Temp: 98.2 F (36.8 C) 97.6 F (36.4 C) 98.6 F (37 C) (!) 97 F (36.1 C)  TempSrc:  SpO2: 98% 100% 99% 100%  Weight:      Height:        Intake/Output Summary (Last 24 hours) at 02/26/2023 1635 Last data filed at 02/26/2023 1627 Gross per 24 hour  Intake 2754.3 ml  Output 1600 ml  Net 1154.3 ml   Filed Weights   02/24/23 1240 02/25/23 1208  Weight: (!) 145.2 kg (!) 145.2 kg    Examination:  Physical  Exam Constitutional:      General: He is not in acute distress.    Appearance: He is obese.  Cardiovascular:     Rate and Rhythm: Normal rate and regular rhythm.  Pulmonary:     Effort: Pulmonary effort is normal.     Breath sounds: Normal breath sounds.  Abdominal:     Palpations: Abdomen is soft.  Neurological:     Mental Status: He is alert.  Psychiatric:        Mood and Affect: Mood normal.        Behavior: Behavior normal.          Scheduled Medications:   acidophilus  1 capsule Oral TID   atomoxetine  80 mg Oral Daily   enoxaparin (LOVENOX) injection  70 mg Subcutaneous Q24H   insulin aspart  0-20 Units Subcutaneous TID WC   insulin aspart  0-5 Units Subcutaneous QHS   insulin glargine-yfgn  60 Units Subcutaneous QHS   pravastatin  40 mg Oral QHS   sertraline  50 mg Oral Daily    Continuous Infusions:  sodium chloride 5 mL/hr at 02/26/23 0329   ceFEPime (MAXIPIME) IV 2 g (02/26/23 1555)   metronidazole 500 mg (02/26/23 1557)   vancomycin Stopped (02/26/23 0646)    PRN Medications:  sodium chloride, acetaminophen **OR** acetaminophen, ondansetron **OR** ondansetron (ZOFRAN) IV, oxyCODONE, senna-docusate  Antimicrobials from admission:  Anti-infectives (From admission, onward)    Start     Dose/Rate Route Frequency Ordered Stop   02/25/23 1308  vancomycin (VANCOCIN) powder  Status:  Discontinued          As needed 02/25/23 1308 02/25/23 1348   02/25/23 0500  vancomycin (VANCOREADY) IVPB 2000 mg/400 mL        2,000 mg 200 mL/hr over 120 Minutes Intravenous Every 12 hours 02/24/23 1639     02/25/23 0300  metroNIDAZOLE (FLAGYL) IVPB 500 mg        500 mg 100 mL/hr over 60 Minutes Intravenous Every 12 hours 02/24/23 1600     02/24/23 2300  ceFEPIme (MAXIPIME) 2 g in sodium chloride 0.9 % 100 mL IVPB        2 g 200 mL/hr over 30 Minutes Intravenous Every 8 hours 02/24/23 1631     02/24/23 1700  vancomycin (VANCOREADY) IVPB 500 mg/100 mL        500 mg 100  mL/hr over 60 Minutes Intravenous  Once 02/24/23 1631 02/24/23 2135   02/24/23 1445  ceFEPIme (MAXIPIME) 2 g in sodium chloride 0.9 % 100 mL IVPB        2 g 200 mL/hr over 30 Minutes Intravenous  Once 02/24/23 1436 02/24/23 1607   02/24/23 1445  metroNIDAZOLE (FLAGYL) IVPB 500 mg        500 mg 100 mL/hr over 60 Minutes Intravenous  Once 02/24/23 1436 02/24/23 1609   02/24/23 1445  vancomycin (VANCOCIN) IVPB 1000 mg/200 mL premix  Status:  Discontinued        1,000 mg 200 mL/hr over 60 Minutes Intravenous  Once 02/24/23 1436 02/24/23  1441   02/24/23 1445  vancomycin (VANCOREADY) IVPB 2000 mg/400 mL        2,000 mg 200 mL/hr over 120 Minutes Intravenous  Once 02/24/23 1442 02/24/23 1829           Data Reviewed:  I have personally reviewed the following...  CBC: Recent Labs  Lab 02/24/23 1244 02/25/23 0325  WBC 13.7* 11.3*  NEUTROABS 9.2*  --   HGB 15.6 13.6  HCT 45.9 40.6  MCV 81.7 81.5  PLT 341 290   Basic Metabolic Panel: Recent Labs  Lab 02/24/23 1244 02/25/23 0325  NA 134* 136  K 4.1 4.2  CL 97* 104  CO2 25 25  GLUCOSE 345* 204*  BUN 14 13  CREATININE 0.82 0.68  CALCIUM 9.2 8.5*   GFR: Estimated Creatinine Clearance: 178.5 mL/min (by C-G formula based on SCr of 0.68 mg/dL). Liver Function Tests: Recent Labs  Lab 02/24/23 1244  AST 13*  ALT 15  ALKPHOS 95  BILITOT 1.7*  PROT 9.2*  ALBUMIN 4.3   No results for input(s): "LIPASE", "AMYLASE" in the last 168 hours. No results for input(s): "AMMONIA" in the last 168 hours. Coagulation Profile: No results for input(s): "INR", "PROTIME" in the last 168 hours. Cardiac Enzymes: No results for input(s): "CKTOTAL", "CKMB", "CKMBINDEX", "TROPONINI" in the last 168 hours. BNP (last 3 results) No results for input(s): "PROBNP" in the last 8760 hours. HbA1C: Recent Labs    02/24/23 1611  HGBA1C 9.6*   CBG: Recent Labs  Lab 02/25/23 1351 02/25/23 1628 02/25/23 2040 02/26/23 0803 02/26/23 1230   GLUCAP 185* 222* 246* 213* 238*   Lipid Profile: No results for input(s): "CHOL", "HDL", "LDLCALC", "TRIG", "CHOLHDL", "LDLDIRECT" in the last 72 hours. Thyroid Function Tests: No results for input(s): "TSH", "T4TOTAL", "FREET4", "T3FREE", "THYROIDAB" in the last 72 hours. Anemia Panel: No results for input(s): "VITAMINB12", "FOLATE", "FERRITIN", "TIBC", "IRON", "RETICCTPCT" in the last 72 hours. Most Recent Urinalysis On File:     Component Value Date/Time   COLORURINE YELLOW (A) 02/24/2023 1611   APPEARANCEUR HAZY (A) 02/24/2023 1611   LABSPEC 1.033 (H) 02/24/2023 1611   PHURINE 5.0 02/24/2023 1611   GLUCOSEU >=500 (A) 02/24/2023 1611   HGBUR NEGATIVE 02/24/2023 1611   BILIRUBINUR NEGATIVE 02/24/2023 1611   BILIRUBINUR negative 08/10/2016 1116   KETONESUR 20 (A) 02/24/2023 1611   PROTEINUR NEGATIVE 02/24/2023 1611   UROBILINOGEN 0.2 08/10/2016 1116   NITRITE NEGATIVE 02/24/2023 1611   LEUKOCYTESUR NEGATIVE 02/24/2023 1611   Sepsis Labs: @LABRCNTIP (procalcitonin:4,lacticidven:4) Microbiology: Recent Results (from the past 240 hour(s))  Blood culture (single)     Status: None (Preliminary result)   Collection Time: 02/24/23 12:44 PM   Specimen: BLOOD  Result Value Ref Range Status   Specimen Description BLOOD RIGHT ANTECUBITAL  Final   Special Requests   Final    BOTTLES DRAWN AEROBIC AND ANAEROBIC Blood Culture adequate volume   Culture   Final    NO GROWTH 2 DAYS Performed at [name], [address] [place], [place] [address]    Report Status PENDING  Incomplete  Blood culture (single)     Status: None (Preliminary result)   Collection Time: 02/24/23  2:56 PM   Specimen: BLOOD  Result Value Ref Range Status   Specimen Description BLOOD BLOOD LEFT FOREARM  Final   Special Requests   Final    BOTTLES DRAWN AEROBIC AND ANAEROBIC Blood Culture adequate volume   Culture   Final    NO GROWTH 2  DAYS Performed at [facility], [address], [place], [place] [address]    Report Status PENDING  Incomplete  Surgical pcr screen     Status: None   Collection Time: 02/24/23 10:29 PM   Specimen: Nasal Mucosa; Nasal Swab  Result Value Ref Range Status   MRSA, PCR NEGATIVE NEGATIVE Final   Staphylococcus aureus NEGATIVE NEGATIVE Final    Comment: (NOTE) The Xpert SA Assay (FDA approved for NASAL specimens in patients 69 years of age and older), is one component of a comprehensive surveillance program. It is not intended to diagnose infection nor to guide or monitor treatment. Performed at [facility], [address] [place], [place] [address]   Aerobic/Anaerobic Culture w Gram Stain (surgical/deep wound)     Status: None (Preliminary result)   Collection Time: 02/25/23  1:11 PM   Specimen: Path fluid; Body Fluid  Result Value Ref Range Status   Specimen Description   Final    WOUND Performed at [facility], [address], [place], [place] [address]    Special Requests   Final    FLDP Performed at [facility], [address] [place], [place] [address]    Gram Stain   Final    RARE WBC PRESENT, PREDOMINANTLY PMN FEW GRAM POSITIVE COCCI    Culture   Final    TOO YOUNG TO READ Performed at [facility], [address] [place], [place] [address]    Report Status PENDING  Incomplete  Aerobic/Anaerobic Culture w Gram Stain (surgical/deep wound)     Status: None (Preliminary result)   Collection Time: 02/25/23  1:18 PM   Specimen: Path Tissue  Result Value Ref Range Status   Specimen Description   Final    TISSUE Performed at [facility], [name], [place], [place] [address]    Special Requests   Final    NONE Performed at [facility], [address] [place], [place] [address]    Gram Stain NO WBC SEEN NO ORGANISMS SEEN   Final   Culture   Final    NO GROWTH < 12 HOURS Performed at [name], [address], [place], [place] [address]     Report Status PENDING  Incomplete  Aerobic/Anaerobic Culture w Gram Stain (surgical/deep wound)     Status: None (Preliminary result)   Collection Time: 02/25/23  1:26 PM   Specimen: Path Tissue  Result Value Ref Range Status   Specimen Description   Final    WOUND Performed at [facility], [address], [place], [place] [address]    Special Requests   Final    TISP Performed at [facility], [address] [place], [place] [address]    Gram Stain NO WBC SEEN NO ORGANISMS SEEN   Final   Culture   Final    NO GROWTH < 12 HOURS Performed at [facility], [address], [place], [place] [address]    Report Status PENDING  Incomplete      Radiology Studies last 3 days: US ARTERIAL ABI (SCREENING LOWER EXTREMITY)  Result Date: 02/26/2023 CLINICAL DATA:  Diabetes with left fifth toe osteomyelitis and status post right first toe amputation for infection. EXAM: NONINVASIVE PHYSIOLOGIC VASCULAR STUDY OF BILATERAL LOWER EXTREMITIES TECHNIQUE: Evaluation of both lower extremities were performed at rest, including calculation of ankle-brachial indices with single level Doppler, pressure and pulse volume recording. COMPARISON:  None Available. FINDINGS:  Right ABI:  1.25 Left ABI:  1.22 Right Lower Extremity: Normal triphasic posterior tibial and dorsalis pedis arterial waveforms at the ankle. Left Lower Extremity: Normal posterior tibial and dorsalis pedis triphasic arterial waveforms at the ankle. 1.0-1.4 Normal IMPRESSION: Normal resting ankle-brachial indices bilaterally and normal triphasic distal waveforms at the level of the posterior tibial and dorsalis pedis arteries. Electronically Signed   By: Irish Lack M.D.   On: 02/26/2023 11:05   DG Foot Complete Right  Result Date: 02/25/2023 CLINICAL DATA:  Postoperative check. Status post partial first ray amputation. EXAM: RIGHT FOOT COMPLETE - 3+ VIEW COMPARISON:  Right foot radiographs 02/24/2023 and MRI  right forefoot 02/24/2023 FINDINGS: Interval progressive amputation of the first ray, now to the distal shaft of the first metatarsal. There are numerous antibiotic beads overlying the distal aspect the remaining first metatarsal on the distal soft tissues. No significant change in flattening of the second metatarsal head and widening of the second metatarsophalangeal joint space. Mild third metatarsophalangeal joint space narrowing and lateral spurring. Moderate to severe plantar and moderate posterior calcaneal heel spurs. Mild-to-moderate dorsal midfoot degenerative osteophytes. IMPRESSION: Interval progressive amputation of the first ray, now to the distal shaft of the first metatarsal. No unexpected findings. Electronically Signed   By: Neita Garnet M.D.   On: 02/25/2023 15:07   MR FOOT RIGHT WO CONTRAST  Result Date: 02/25/2023 CLINICAL DATA:  Osteonecrosis suspected, foot, xray done History of diabetes with polyneuropathy and previous toe amputations. Bilateral foot ulcerations. EXAM: MRI OF THE RIGHT FOREFOOT WITHOUT CONTRAST TECHNIQUE: Multiplanar, multisequence MR imaging of the right forefoot was performed. No intravenous contrast was administered. COMPARISON:  Radiographs 02/24/2023 and 02/18/2023.  MRI 07/09/2021. FINDINGS: Technical note: Coronal (long axis axial) T1 weighted images were mistakenly not performed. The short axis axial T1 weighted images were duplicated. Despite efforts by the technologist and patient, mild motion artifact is present on today's exam and could not be eliminated. This reduces exam sensitivity and specificity. Bones/Joint/Cartilage Since the previous MRI, the great toe has been amputated at the metatarsophalangeal joint. New nonspecific low-level T2 hyperintensity within the 1st metatarsal head without corresponding T1 marrow signal abnormality or cortical destruction. No other suspicious marrow edema. As demonstrated on recent radiographs, there is flattening of the  2nd, 3rd and 4th metatarsal heads which is new from the previous MRI and stable from recent radiographs. Associated cortical thickening of the 2nd metatarsal neck, widening of the 2nd metatarsophalangeal joint and probable 6 mm bony fragment within the joint (image 17/7). Mild midfoot degenerative changes. No significant joint effusions. Ligaments The Lisfranc ligament is intact. The collateral ligaments of the remaining metatarsophalangeal joints appear intact. Muscles and Tendons Generalized forefoot muscular atrophy. No focal intramuscular fluid collection. No significant tenosynovitis. Soft tissues Irregular soft tissue ulcer extending distally from the amputated great toe with underlying ill-defined T2 hyperintensity within the soft tissues consistent with a phlegmon. No well-defined/drainable abscess. Generalized subcutaneous edema throughout the dorsal aspect of the forefoot. IMPRESSION: 1. Irregular soft tissue ulcer extending distally from the amputated great toe with underlying soft tissue phlegmon. No well-defined/drainable abscess. 2. New nonspecific low-level T2 hyperintensity within the 1st metatarsal head without corresponding T1 marrow signal abnormality or cortical destruction. This may be reactive, although given proximity to the overlying soft tissue findings is concerning for early osteomyelitis. 3. Flattening of the 2nd, 3rd and 4th metatarsal heads with widening of the 2nd metatarsophalangeal joint and probable 6 mm bony fragment within the joint. These findings are new  from previous MRI and could be secondary to neuropathic arthropathy. No evidence of acute fracture. 4. Generalized subcutaneous edema throughout the dorsal aspect of the forefoot. Electronically Signed   By: Carey Bullocks M.D.   On: 02/25/2023 11:02   MR FOOT LEFT WO CONTRAST  Result Date: 02/25/2023 CLINICAL DATA:  Osteonecrosis suspected, foot, xray done History of diabetes with polyneuropathy and previous toe  amputations. Bilateral foot ulcerations. EXAM: MRI OF THE LEFT FOOT WITHOUT CONTRAST TECHNIQUE: Multiplanar, multisequence MR imaging of the left forefoot was performed. No intravenous contrast was administered. COMPARISON:  Radiographs 02/18/2023 and 12/02/2022.  MRI 03/02/2020. FINDINGS: Bones/Joint/Cartilage Previous amputation of the great toe at the metatarsophalangeal joint. As seen on recent radiographs, there is new irregular joint space widening at the 5th metatarsophalangeal joint associated with underlying erosive changes and marrow edema within the 5th metatarsal head and base of the 5th proximal phalanx. These findings are consistent with underlying osteomyelitis. T2 hyperintensity within the 5th metatarsal extends to the proximal metadiaphysis, although the T1 marrow signal abnormality is isolated to the distal portion of the metatarsal. There is underlying chronic fracture through the 5th metatarsal neck. No other evidence of osteomyelitis, acute fracture or dislocation. Advanced arthropathy at the 2nd metatarsophalangeal joint with chronic flattening of the 2nd metatarsal head, stable from recent radiographs, but new from remote MRI. Mild midfoot degenerative changes without subluxation. No significant joint effusions. Ligaments Intact Lisfranc ligament. The collateral ligaments of the remaining metatarsophalangeal joints appear intact. Muscles and Tendons Generalized forefoot muscular atrophy without focal fluid collection. No significant tenosynovitis. Soft tissues Suspected soft tissue ulceration lateral to the 5th metatarsophalangeal joint with associated ill-defined fluid and diffuse soft tissue swelling. No focal drainable fluid collection identified. IMPRESSION: 1. Suspected soft tissue ulceration lateral to the 5th metatarsophalangeal joint with underlying osteomyelitis of the 5th metatarsal head and base of the 5th proximal phalanx, as described. 2. No focal drainable fluid collection  identified. 3. Advanced arthropathy at the 2nd metatarsophalangeal joint with chronic flattening of the 2nd metatarsal head. 4. Previous amputation of the great toe at the metatarsophalangeal joint. Electronically Signed   By: Carey Bullocks M.D.   On: 02/25/2023 10:23   US Venous Img Lower Bilateral (DVT)  Result Date: 02/25/2023 CLINICAL DATA:  Bilateral lower extremity edema EXAM: BILATERAL LOWER EXTREMITY VENOUS DOPPLER ULTRASOUND TECHNIQUE: Gray-scale sonography with graded compression, as well as color Doppler and duplex ultrasound were performed to evaluate the lower extremity deep venous systems from the level of the common femoral vein and including the common femoral, femoral, profunda femoral, popliteal and calf veins including the posterior tibial, peroneal and gastrocnemius veins when visible. The superficial great saphenous vein was also interrogated. Spectral Doppler was utilized to evaluate flow at rest and with distal augmentation maneuvers in the common femoral, femoral and popliteal veins. COMPARISON:  None Available. FINDINGS: RIGHT LOWER EXTREMITY Common Femoral Vein: No evidence of thrombus. Normal compressibility, respiratory phasicity and response to augmentation. Saphenofemoral Junction: No evidence of thrombus. Normal compressibility and flow on color Doppler imaging. Profunda Femoral Vein: No evidence of thrombus. Normal compressibility and flow on color Doppler imaging. Femoral Vein: No evidence of thrombus. Normal compressibility, respiratory phasicity and response to augmentation. Popliteal Vein: No evidence of thrombus. Normal compressibility, respiratory phasicity and response to augmentation. Calf Veins: No evidence of thrombus. Normal compressibility and flow on color Doppler imaging. Superficial Great Saphenous Vein: No evidence of thrombus. Normal compressibility. Venous Reflux:  None. Other Findings:  None. LEFT LOWER EXTREMITY Common Femoral  Vein: No evidence of thrombus.  Normal compressibility, respiratory phasicity and response to augmentation. Saphenofemoral Junction: No evidence of thrombus. Normal compressibility and flow on color Doppler imaging. Profunda Femoral Vein: No evidence of thrombus. Normal compressibility and flow on color Doppler imaging. Femoral Vein: No evidence of thrombus. Normal compressibility, respiratory phasicity and response to augmentation. Popliteal Vein: No evidence of thrombus. Normal compressibility, respiratory phasicity and response to augmentation. Calf Veins: No evidence of thrombus. Normal compressibility and flow on color Doppler imaging. Superficial Great Saphenous Vein: No evidence of thrombus. Normal compressibility. Venous Reflux:  None. Other Findings:  None. IMPRESSION: No evidence of deep venous thrombosis in either lower extremity. Electronically Signed   By: Malachy Moan M.D.   On: 02/25/2023 10:04   DG Foot Complete Right  Result Date: 02/24/2023 CLINICAL DATA:  Worsening right foot wound EXAM: RIGHT FOOT COMPLETE - 3 VIEW COMPARISON:  Right foot radiographs dated 02/18/2023 FINDINGS: Postsurgical changes from amputation of the great toe. Similar appearance of soft tissue wound at the amputation site. There is no evidence of fracture or dislocation. Similar cortical thickening of the second metatarsal shaft and widened second metatarsophalangeal joint space. No focal cortical lucency. Degenerative changes of the foot. Diffuse soft tissue edema of the forefoot. Soft tissues are unremarkable. IMPRESSION: 1. Postsurgical changes from amputation of the great toe. Similar appearance of soft tissue wound at the amputation site. 2. Similar cortical thickening of the second metatarsal shaft and widened second metatarsophalangeal joint space. No focal cortical lucency to suggest osteomyelitis. Electronically Signed   By: Agustin Cree M.D.   On: 02/24/2023 14:11         Sunnie Nielsen, DO [facility] 02/26/2023, 4:35 PM     Dictation software may have been used to generate the above note. Typos may occur and escape review in typed/dictated notes. Please contact Dr Lyn Hollingshead directly for clarity if needed.  Staff may message me via secure chat in Epic  but this may not receive an immediate response,  please page me for urgent matters!  If 7PM-7AM, please contact night coverage [email or web address]

## 2023-02-26 NOTE — Plan of Care (Signed)
  Problem: Skin Integrity: Goal: Risk for impaired skin integrity will decrease Outcome: Progressing   Problem: Education: Goal: Knowledge of General Education information will improve Description: Including pain rating scale, medication(s)/side effects and non-pharmacologic comfort measures Outcome: Progressing   Problem: Health Behavior/Discharge Planning: Goal: Ability to manage health-related needs will improve Outcome: Progressing

## 2023-02-27 DIAGNOSIS — M86271 Subacute osteomyelitis, right ankle and foot: Secondary | ICD-10-CM | POA: Diagnosis not present

## 2023-02-27 LAB — GLUCOSE, CAPILLARY
Glucose-Capillary: 208 mg/dL — ABNORMAL HIGH (ref 70–99)
Glucose-Capillary: 172 mg/dL — ABNORMAL HIGH (ref 70–99)
Glucose-Capillary: 195 mg/dL — ABNORMAL HIGH (ref 70–99)
Glucose-Capillary: 171 mg/dL — ABNORMAL HIGH (ref 70–99)

## 2023-02-27 NOTE — Plan of Care (Signed)
  Problem: Education: Goal: Ability to describe self-care measures that may prevent or decrease complications (Diabetes Survival Skills Education) will improve Outcome: Progressing   Problem: Coping: Goal: Ability to adjust to condition or change in health will improve Outcome: Progressing   Problem: Fluid Volume: Goal: Ability to maintain a balanced intake and output will improve Outcome: Progressing   Problem: Health Behavior/Discharge Planning: Goal: Ability to identify and utilize available resources and services will improve Outcome: Progressing Goal: Ability to manage health-related needs will improve Outcome: Progressing   Problem: Metabolic: Goal: Ability to maintain appropriate glucose levels will improve Outcome: Progressing   Problem: Nutritional: Goal: Maintenance of adequate nutrition will improve Outcome: Progressing Goal: Progress toward achieving an optimal weight will improve Outcome: Progressing   Problem: Skin Integrity: Goal: Risk for impaired skin integrity will decrease Outcome: Progressing   Problem: Tissue Perfusion: Goal: Adequacy of tissue perfusion will improve Outcome: Progressing   Problem: Education: Goal: Knowledge of General Education information will improve Description: Including pain rating scale, medication(s)/side effects and non-pharmacologic comfort measures Outcome: Progressing   Problem: Clinical Measurements: Goal: Will remain free from infection Outcome: Progressing Goal: Respiratory complications will improve Outcome: Progressing Goal: Cardiovascular complication will be avoided Outcome: Progressing   Problem: Activity: Goal: Risk for activity intolerance will decrease Outcome: Progressing   Problem: Coping: Goal: Level of anxiety will decrease Outcome: Progressing   Problem: Elimination: Goal: Will not experience complications related to bowel motility Outcome: Progressing Goal: Will not experience complications  related to urinary retention Outcome: Progressing   Problem: Safety: Goal: Ability to remain free from injury will improve Outcome: Progressing

## 2023-02-27 NOTE — Progress Notes (Signed)
PROGRESS NOTE    [name]   YQM:[id number] DOB: [birth year]  DOA: 02/24/2023 Date of Service: 02/27/23 which is hospital day 3  PCP: Marcine Matar, MD    HPI: [name] is a 42 y.o. male with medical history significant of IDDM with insulin resistance, HTN, HLD, PVD, anxiety/depression, presented with worsening of right foot infection. Chronic L foot osteomyelitis, following with podiatry and wound care, chronic abx and barometric chamber. 7 days ago (+)new ulcer on the R foot plantar side of the forefoot, with rash, purulent discharge and swelling - started on antibiotics doxycycline 7 days ago but worsening, wound care sent him to ED   Hospital course / significant events:  10/02:  admitted to hospitalist service w/ podiatry consult. Started vanc, Cefepime, Flagyl. Podiatry did some debridement at bedside, planning for amputation tomorrow, pending MRI and ABI, pending DVT US  10/03: amputation R 1st toe and bx L 5th toe. ID consult - await cultures to guide abx  10/04-10/05: awaiting cultures and surgical pathology to guide abx treatment which may involve long term IV abx    Consultants:  Podiatry  Infectious Disease   Procedures/Surgeries: 10/02: debridement of ulcers Right first metatarsal plantar, Left plantar first metatarsal, Left plantar fourth metatarsal phalangeal joint, Left lateral fifth metatarsal phalange joint - Dr Excell Seltzer, podiatry  10/03: Right partial first ray amputation with antibiotic bead application, and Left fifth metatarsal phalangeal joint bone biopsy       ASSESSMENT & PLAN:   Osteomyelitis - chronic L foot, potential new R foot - POA Right foot diabetic ulcer infection, failed outpatient management - POA S/p Right partial first ray amputation with antibiotic bead application, and Left fifth metatarsal phalangeal joint bone biopsy 02/25/23  DVT US: neg for DVT BL LE ABI: no concerns  Podiatry following broad-spectrum  antibiotics of vancomycin, cefepime and Flagyl pending ID recs which are pending path/cx     Peripheral vascular disease ABI no concerns    IDDM with hyperglycemia Significant insulin resistance Continue Lantus Add SSI Hold off metformin Pt unable to afford GLP1 meds, will likely need to d/c on higher insulin regimen     Morbid obesity based on BMI and comorbid illnesses: Body mass index is 42.22 kg/m.   DVT prophylaxis: lovenox  IV fluids: no continuous IV fluids  Nutrition: carb modified diet  Central lines / invasive devices: none  Code Status: FULL CODE ACP documentation reviewed: 02/25/23 and none on file in [facility]  TOC needs: TBD may need HH/DME Barriers to dispo / significant pending items:  d/c pending abx recs - awaiting cultures              Subjective / Brief ROS:  Patient reports doing well today, no concerns  Pain controlled.  Tolerating diet.    Family Communication: family at bedside on rounds     Objective Findings:  Vitals:   02/26/23 2002 02/27/23 0008 02/27/23 0449 02/27/23 0756  BP: 133/84 134/87 (!) 129/90 120/79  Pulse: 88 79 75 80  Resp: 20 20 18 18   Temp: 97.8 F (36.6 C) 98.3 F (36.8 C) 97.6 F (36.4 C) 97.7 F (36.5 C)  TempSrc:      SpO2: 100% 100% 100% 100%  Weight:      Height:        Intake/Output Summary (Last 24 hours) at 02/27/2023 1257 Last data filed at 02/27/2023 1200 Gross per 24 hour  Intake 640 ml  Output 1401 ml  Net -761  ml   Filed Weights   02/24/23 1240 02/25/23 1208  Weight: (!) 145.2 kg (!) 145.2 kg    Examination:  Physical Exam Constitutional:      General: He is not in acute distress.    Appearance: He is obese.  Pulmonary:     Effort: Pulmonary effort is normal.     Breath sounds: Normal breath sounds.  Neurological:     Mental Status: He is alert.  Psychiatric:        Mood and Affect: Mood normal.        Behavior: Behavior normal.          Scheduled Medications:    acidophilus  1 capsule Oral TID   atomoxetine  80 mg Oral Daily   enoxaparin (LOVENOX) injection  70 mg Subcutaneous Q24H   insulin aspart  0-20 Units Subcutaneous TID WC   insulin aspart  0-5 Units Subcutaneous QHS   insulin glargine-yfgn  60 Units Subcutaneous QHS   pravastatin  40 mg Oral QHS   sertraline  50 mg Oral Daily    Continuous Infusions:  sodium chloride 5 mL/hr at 02/26/23 0329   ceFEPime (MAXIPIME) IV 2 g (02/27/23 0610)   metronidazole 500 mg (02/27/23 0359)   vancomycin 2,000 mg (02/27/23 0403)    PRN Medications:  sodium chloride, acetaminophen **OR** acetaminophen, ondansetron **OR** ondansetron (ZOFRAN) IV, oxyCODONE, senna-docusate  Antimicrobials from admission:  Anti-infectives (From admission, onward)    Start     Dose/Rate Route Frequency Ordered Stop   02/25/23 1308  vancomycin (VANCOCIN) powder  Status:  Discontinued          As needed 02/25/23 1308 02/25/23 1348   02/25/23 0500  vancomycin (VANCOREADY) IVPB 2000 mg/400 mL        2,000 mg 200 mL/hr over 120 Minutes Intravenous Every 12 hours 02/24/23 1639     02/25/23 0300  metroNIDAZOLE (FLAGYL) IVPB 500 mg        500 mg 100 mL/hr over 60 Minutes Intravenous Every 12 hours 02/24/23 1600     02/24/23 2300  ceFEPIme (MAXIPIME) 2 g in sodium chloride 0.9 % 100 mL IVPB        2 g 200 mL/hr over 30 Minutes Intravenous Every 8 hours 02/24/23 1631     02/24/23 1700  vancomycin (VANCOREADY) IVPB 500 mg/100 mL        500 mg 100 mL/hr over 60 Minutes Intravenous  Once 02/24/23 1631 02/24/23 2135   02/24/23 1445  ceFEPIme (MAXIPIME) 2 g in sodium chloride 0.9 % 100 mL IVPB        2 g 200 mL/hr over 30 Minutes Intravenous  Once 02/24/23 1436 02/24/23 1607   02/24/23 1445  metroNIDAZOLE (FLAGYL) IVPB 500 mg        500 mg 100 mL/hr over 60 Minutes Intravenous  Once 02/24/23 1436 02/24/23 1609   02/24/23 1445  vancomycin (VANCOCIN) IVPB 1000 mg/200 mL premix  Status:  Discontinued        1,000 mg 200 mL/hr  over 60 Minutes Intravenous  Once 02/24/23 1436 02/24/23 1441   02/24/23 1445  vancomycin (VANCOREADY) IVPB 2000 mg/400 mL        2,000 mg 200 mL/hr over 120 Minutes Intravenous  Once 02/24/23 1442 02/24/23 1829           Data Reviewed:  I have personally reviewed the following...  CBC: Recent Labs  Lab 02/24/23 1244 02/25/23 0325  WBC 13.7* 11.3*  NEUTROABS 9.2*  --  HGB 15.6 13.6  HCT 45.9 40.6  MCV 81.7 81.5  PLT 341 290   Basic Metabolic Panel: Recent Labs  Lab 02/24/23 1244 02/25/23 0325  NA 134* 136  K 4.1 4.2  CL 97* 104  CO2 25 25  GLUCOSE 345* 204*  BUN 14 13  CREATININE 0.82 0.68  CALCIUM 9.2 8.5*   GFR: Estimated Creatinine Clearance: 178.5 mL/min (by C-G formula based on SCr of 0.68 mg/dL). Liver Function Tests: Recent Labs  Lab 02/24/23 1244  AST 13*  ALT 15  ALKPHOS 95  BILITOT 1.7*  PROT 9.2*  ALBUMIN 4.3   No results for input(s): "LIPASE", "AMYLASE" in the last 168 hours. No results for input(s): "AMMONIA" in the last 168 hours. Coagulation Profile: No results for input(s): "INR", "PROTIME" in the last 168 hours. Cardiac Enzymes: No results for input(s): "CKTOTAL", "CKMB", "CKMBINDEX", "TROPONINI" in the last 168 hours. BNP (last 3 results) No results for input(s): "PROBNP" in the last 8760 hours. HbA1C: Recent Labs    02/24/23 1611  HGBA1C 9.6*   CBG: Recent Labs  Lab 02/26/23 1230 02/26/23 1635 02/26/23 2152 02/27/23 0935 02/27/23 1151  GLUCAP 238* 197* 200* 208* 172*   Lipid Profile: No results for input(s): "CHOL", "HDL", "LDLCALC", "TRIG", "CHOLHDL", "LDLDIRECT" in the last 72 hours. Thyroid Function Tests: No results for input(s): "TSH", "T4TOTAL", "FREET4", "T3FREE", "THYROIDAB" in the last 72 hours. Anemia Panel: No results for input(s): "VITAMINB12", "FOLATE", "FERRITIN", "TIBC", "IRON", "RETICCTPCT" in the last 72 hours. Most Recent Urinalysis On File:     Component Value Date/Time   COLORURINE YELLOW  (A) 02/24/2023 1611   APPEARANCEUR HAZY (A) 02/24/2023 1611   LABSPEC 1.033 (H) 02/24/2023 1611   PHURINE 5.0 02/24/2023 1611   GLUCOSEU >=500 (A) 02/24/2023 1611   HGBUR NEGATIVE 02/24/2023 1611   BILIRUBINUR NEGATIVE 02/24/2023 1611   BILIRUBINUR negative 08/10/2016 1116   KETONESUR 20 (A) 02/24/2023 1611   PROTEINUR NEGATIVE 02/24/2023 1611   UROBILINOGEN 0.2 08/10/2016 1116   NITRITE NEGATIVE 02/24/2023 1611   LEUKOCYTESUR NEGATIVE 02/24/2023 1611   Sepsis Labs: @LABRCNTIP (procalcitonin:4,lacticidven:4) Microbiology: Recent Results (from the past 240 hour(s))  Blood culture (single)     Status: None (Preliminary result)   Collection Time: 02/24/23 12:44 PM   Specimen: BLOOD  Result Value Ref Range Status   Specimen Description BLOOD RIGHT ANTECUBITAL  Final   Special Requests   Final    BOTTLES DRAWN AEROBIC AND ANAEROBIC Blood Culture adequate volume   Culture   Final    NO GROWTH 3 DAYS Performed at [facility], [address] [place], [place] [address]    Report Status PENDING  Incomplete  Blood culture (single)     Status: None (Preliminary result)   Collection Time: 02/24/23  2:56 PM   Specimen: BLOOD  Result Value Ref Range Status   Specimen Description BLOOD BLOOD LEFT FOREARM  Final   Special Requests   Final    BOTTLES DRAWN AEROBIC AND ANAEROBIC Blood Culture adequate volume   Culture   Final    NO GROWTH 3 DAYS Performed at [facility], [address], [place], [place] [address]    Report Status PENDING  Incomplete  Surgical pcr screen     Status: None   Collection Time: 02/24/23 10:29 PM   Specimen: Nasal Mucosa; Nasal Swab  Result Value Ref Range Status   MRSA, PCR NEGATIVE NEGATIVE Final   Staphylococcus aureus NEGATIVE NEGATIVE Final    Comment: (NOTE) The Xpert SA  Assay (FDA approved for NASAL specimens in patients 79 years of age and older), is one component of a comprehensive surveillance program. It is not intended to  diagnose infection nor to guide or monitor treatment. Performed at [facility], [address] [place], [place] [address]   Aerobic/Anaerobic Culture w Gram Stain (surgical/deep wound)     Status: None (Preliminary result)   Collection Time: 02/25/23  1:11 PM   Specimen: Path fluid; Body Fluid  Result Value Ref Range Status   Specimen Description   Final    WOUND Performed at [facility], [address], [place], [place] [address]    Special Requests   Final    FLDP Performed at [facility], [address] [place], [place] [address]    Gram Stain   Final    RARE WBC PRESENT, PREDOMINANTLY PMN FEW GRAM POSITIVE COCCI Performed at [facility], [address], [place], [place] [address]    Culture   Final    MODERATE GROUP B STREP(S.AGALACTIAE)ISOLATED TESTING AGAINST S. AGALACTIAE NOT ROUTINELY PERFORMED DUE TO PREDICTABILITY OF AMP/PEN/VAN SUSCEPTIBILITY. NO ANAEROBES ISOLATED; CULTURE IN PROGRESS FOR 5 DAYS    Report Status PENDING  Incomplete  Aerobic/Anaerobic Culture w Gram Stain (surgical/deep wound)     Status: None (Preliminary result)   Collection Time: 02/25/23  1:18 PM   Specimen: Path Tissue  Result Value Ref Range Status   Specimen Description   Final    TISSUE Performed at [facility], [address] [place], [place] [address]    Special Requests   Final    NONE Performed at [facility], [address] [place], [place] [address]    Gram Stain   Final    NO WBC SEEN NO ORGANISMS SEEN Performed at [facility], [address], [place], [place] [address]    Culture   Final    RARE GROUP B STREP(S.AGALACTIAE)ISOLATED TESTING AGAINST S. AGALACTIAE NOT ROUTINELY PERFORMED DUE TO PREDICTABILITY OF AMP/PEN/VAN SUSCEPTIBILITY. NO ANAEROBES ISOLATED; CULTURE IN PROGRESS FOR 5 DAYS    Report Status PENDING  Incomplete  Aerobic/Anaerobic Culture w Gram Stain (surgical/deep wound)     Status:  None (Preliminary result)   Collection Time: 02/25/23  1:26 PM   Specimen: Path Tissue  Result Value Ref Range Status   Specimen Description   Final    WOUND Performed at [facility], [address] [place], [place] [address]    Special Requests   Final    TISP Performed at [facility], [address] [place], [place] [address]    Gram Stain   Final    NO WBC SEEN NO ORGANISMS SEEN Performed at [facility], [address], [place], [place] [address]    Culture   Final    RARE GROUP B STREP(S.AGALACTIAE)ISOLATED TESTING AGAINST S. AGALACTIAE NOT ROUTINELY PERFORMED DUE TO PREDICTABILITY OF AMP/PEN/VAN SUSCEPTIBILITY. NO ANAEROBES ISOLATED; CULTURE IN PROGRESS FOR 5 DAYS    Report Status PENDING  Incomplete      Radiology Studies last 3 days: US ARTERIAL ABI (SCREENING LOWER EXTREMITY)  Result Date: 02/26/2023 CLINICAL DATA:  Diabetes with left fifth toe osteomyelitis and status post right first toe amputation for infection. EXAM: NONINVASIVE PHYSIOLOGIC VASCULAR STUDY OF BILATERAL LOWER EXTREMITIES TECHNIQUE: Evaluation of both lower extremities were performed at rest, including calculation of ankle-brachial indices with single level Doppler, pressure and pulse volume recording. COMPARISON:  None Available. FINDINGS: Right  ABI:  1.25 Left ABI:  1.22 Right Lower Extremity: Normal triphasic posterior tibial and dorsalis pedis arterial waveforms at the ankle. Left Lower Extremity: Normal posterior tibial and dorsalis pedis triphasic arterial waveforms at the ankle. 1.0-1.4 Normal IMPRESSION: Normal resting ankle-brachial indices bilaterally and normal triphasic distal waveforms at the level of the posterior tibial and dorsalis pedis arteries. Electronically Signed   By: Irish Lack M.D.   On: 02/26/2023 11:05   DG Foot Complete Right  Result Date: 02/25/2023 CLINICAL DATA:  Postoperative check. Status post partial first ray amputation. EXAM: RIGHT FOOT  COMPLETE - 3+ VIEW COMPARISON:  Right foot radiographs 02/24/2023 and MRI right forefoot 02/24/2023 FINDINGS: Interval progressive amputation of the first ray, now to the distal shaft of the first metatarsal. There are numerous antibiotic beads overlying the distal aspect the remaining first metatarsal on the distal soft tissues. No significant change in flattening of the second metatarsal head and widening of the second metatarsophalangeal joint space. Mild third metatarsophalangeal joint space narrowing and lateral spurring. Moderate to severe plantar and moderate posterior calcaneal heel spurs. Mild-to-moderate dorsal midfoot degenerative osteophytes. IMPRESSION: Interval progressive amputation of the first ray, now to the distal shaft of the first metatarsal. No unexpected findings. Electronically Signed   By: Neita Garnet M.D.   On: 02/25/2023 15:07   MR FOOT RIGHT WO CONTRAST  Result Date: 02/25/2023 CLINICAL DATA:  Osteonecrosis suspected, foot, xray done History of diabetes with polyneuropathy and previous toe amputations. Bilateral foot ulcerations. EXAM: MRI OF THE RIGHT FOREFOOT WITHOUT CONTRAST TECHNIQUE: Multiplanar, multisequence MR imaging of the right forefoot was performed. No intravenous contrast was administered. COMPARISON:  Radiographs 02/24/2023 and 02/18/2023.  MRI 07/09/2021. FINDINGS: Technical note: Coronal (long axis axial) T1 weighted images were mistakenly not performed. The short axis axial T1 weighted images were duplicated. Despite efforts by the technologist and patient, mild motion artifact is present on today's exam and could not be eliminated. This reduces exam sensitivity and specificity. Bones/Joint/Cartilage Since the previous MRI, the great toe has been amputated at the metatarsophalangeal joint. New nonspecific low-level T2 hyperintensity within the 1st metatarsal head without corresponding T1 marrow signal abnormality or cortical destruction. No other suspicious marrow  edema. As demonstrated on recent radiographs, there is flattening of the 2nd, 3rd and 4th metatarsal heads which is new from the previous MRI and stable from recent radiographs. Associated cortical thickening of the 2nd metatarsal neck, widening of the 2nd metatarsophalangeal joint and probable 6 mm bony fragment within the joint (image 17/7). Mild midfoot degenerative changes. No significant joint effusions. Ligaments The Lisfranc ligament is intact. The collateral ligaments of the remaining metatarsophalangeal joints appear intact. Muscles and Tendons Generalized forefoot muscular atrophy. No focal intramuscular fluid collection. No significant tenosynovitis. Soft tissues Irregular soft tissue ulcer extending distally from the amputated great toe with underlying ill-defined T2 hyperintensity within the soft tissues consistent with a phlegmon. No well-defined/drainable abscess. Generalized subcutaneous edema throughout the dorsal aspect of the forefoot. IMPRESSION: 1. Irregular soft tissue ulcer extending distally from the amputated great toe with underlying soft tissue phlegmon. No well-defined/drainable abscess. 2. New nonspecific low-level T2 hyperintensity within the 1st metatarsal head without corresponding T1 marrow signal abnormality or cortical destruction. This may be reactive, although given proximity to the overlying soft tissue findings is concerning for early osteomyelitis. 3. Flattening of the 2nd, 3rd and 4th metatarsal heads with widening of the 2nd metatarsophalangeal joint and probable 6 mm bony fragment within the joint. These findings are new from  previous MRI and could be secondary to neuropathic arthropathy. No evidence of acute fracture. 4. Generalized subcutaneous edema throughout the dorsal aspect of the forefoot. Electronically Signed   By: Carey Bullocks M.D.   On: 02/25/2023 11:02   MR FOOT LEFT WO CONTRAST  Result Date: 02/25/2023 CLINICAL DATA:  Osteonecrosis suspected, foot, xray  done History of diabetes with polyneuropathy and previous toe amputations. Bilateral foot ulcerations. EXAM: MRI OF THE LEFT FOOT WITHOUT CONTRAST TECHNIQUE: Multiplanar, multisequence MR imaging of the left forefoot was performed. No intravenous contrast was administered. COMPARISON:  Radiographs 02/18/2023 and 12/02/2022.  MRI 03/02/2020. FINDINGS: Bones/Joint/Cartilage Previous amputation of the great toe at the metatarsophalangeal joint. As seen on recent radiographs, there is new irregular joint space widening at the 5th metatarsophalangeal joint associated with underlying erosive changes and marrow edema within the 5th metatarsal head and base of the 5th proximal phalanx. These findings are consistent with underlying osteomyelitis. T2 hyperintensity within the 5th metatarsal extends to the proximal metadiaphysis, although the T1 marrow signal abnormality is isolated to the distal portion of the metatarsal. There is underlying chronic fracture through the 5th metatarsal neck. No other evidence of osteomyelitis, acute fracture or dislocation. Advanced arthropathy at the 2nd metatarsophalangeal joint with chronic flattening of the 2nd metatarsal head, stable from recent radiographs, but new from remote MRI. Mild midfoot degenerative changes without subluxation. No significant joint effusions. Ligaments Intact Lisfranc ligament. The collateral ligaments of the remaining metatarsophalangeal joints appear intact. Muscles and Tendons Generalized forefoot muscular atrophy without focal fluid collection. No significant tenosynovitis. Soft tissues Suspected soft tissue ulceration lateral to the 5th metatarsophalangeal joint with associated ill-defined fluid and diffuse soft tissue swelling. No focal drainable fluid collection identified. IMPRESSION: 1. Suspected soft tissue ulceration lateral to the 5th metatarsophalangeal joint with underlying osteomyelitis of the 5th metatarsal head and base of the 5th proximal  phalanx, as described. 2. No focal drainable fluid collection identified. 3. Advanced arthropathy at the 2nd metatarsophalangeal joint with chronic flattening of the 2nd metatarsal head. 4. Previous amputation of the great toe at the metatarsophalangeal joint. Electronically Signed   By: Carey Bullocks M.D.   On: 02/25/2023 10:23   US Venous Img Lower Bilateral (DVT)  Result Date: 02/25/2023 CLINICAL DATA:  Bilateral lower extremity edema EXAM: BILATERAL LOWER EXTREMITY VENOUS DOPPLER ULTRASOUND TECHNIQUE: Gray-scale sonography with graded compression, as well as color Doppler and duplex ultrasound were performed to evaluate the lower extremity deep venous systems from the level of the common femoral vein and including the common femoral, femoral, profunda femoral, popliteal and calf veins including the posterior tibial, peroneal and gastrocnemius veins when visible. The superficial great saphenous vein was also interrogated. Spectral Doppler was utilized to evaluate flow at rest and with distal augmentation maneuvers in the common femoral, femoral and popliteal veins. COMPARISON:  None Available. FINDINGS: RIGHT LOWER EXTREMITY Common Femoral Vein: No evidence of thrombus. Normal compressibility, respiratory phasicity and response to augmentation. Saphenofemoral Junction: No evidence of thrombus. Normal compressibility and flow on color Doppler imaging. Profunda Femoral Vein: No evidence of thrombus. Normal compressibility and flow on color Doppler imaging. Femoral Vein: No evidence of thrombus. Normal compressibility, respiratory phasicity and response to augmentation. Popliteal Vein: No evidence of thrombus. Normal compressibility, respiratory phasicity and response to augmentation. Calf Veins: No evidence of thrombus. Normal compressibility and flow on color Doppler imaging. Superficial Great Saphenous Vein: No evidence of thrombus. Normal compressibility. Venous Reflux:  None. Other Findings:  None. LEFT  LOWER EXTREMITY Common Femoral  Vein: No evidence of thrombus. Normal compressibility, respiratory phasicity and response to augmentation. Saphenofemoral Junction: No evidence of thrombus. Normal compressibility and flow on color Doppler imaging. Profunda Femoral Vein: No evidence of thrombus. Normal compressibility and flow on color Doppler imaging. Femoral Vein: No evidence of thrombus. Normal compressibility, respiratory phasicity and response to augmentation. Popliteal Vein: No evidence of thrombus. Normal compressibility, respiratory phasicity and response to augmentation. Calf Veins: No evidence of thrombus. Normal compressibility and flow on color Doppler imaging. Superficial Great Saphenous Vein: No evidence of thrombus. Normal compressibility. Venous Reflux:  None. Other Findings:  None. IMPRESSION: No evidence of deep venous thrombosis in either lower extremity. Electronically Signed   By: Malachy Moan M.D.   On: 02/25/2023 10:04   DG Foot Complete Right  Result Date: 02/24/2023 CLINICAL DATA:  Worsening right foot wound EXAM: RIGHT FOOT COMPLETE - 3 VIEW COMPARISON:  Right foot radiographs dated 02/18/2023 FINDINGS: Postsurgical changes from amputation of the great toe. Similar appearance of soft tissue wound at the amputation site. There is no evidence of fracture or dislocation. Similar cortical thickening of the second metatarsal shaft and widened second metatarsophalangeal joint space. No focal cortical lucency. Degenerative changes of the foot. Diffuse soft tissue edema of the forefoot. Soft tissues are unremarkable. IMPRESSION: 1. Postsurgical changes from amputation of the great toe. Similar appearance of soft tissue wound at the amputation site. 2. Similar cortical thickening of the second metatarsal shaft and widened second metatarsophalangeal joint space. No focal cortical lucency to suggest osteomyelitis. Electronically Signed   By: Agustin Cree M.D.   On: 02/24/2023 14:11          Sunnie Nielsen, DO [facility] 02/27/2023, 12:57 PM    Dictation software may have been used to generate the above note. Typos may occur and escape review in typed/dictated notes. Please contact Dr Lyn Hollingshead directly for clarity if needed.  Staff may message me via secure chat in Epic  but this may not receive an immediate response,  please page me for urgent matters!  If 7PM-7AM, please contact night coverage [email or web address]

## 2023-02-27 NOTE — Plan of Care (Signed)
  Problem: Metabolic: Goal: Ability to maintain appropriate glucose levels will improve Outcome: Progressing   Problem: Nutritional: Goal: Maintenance of adequate nutrition will improve Outcome: Progressing   Problem: Skin Integrity: Goal: Risk for impaired skin integrity will decrease Outcome: Progressing   Problem: Tissue Perfusion: Goal: Adequacy of tissue perfusion will improve Outcome: Progressing   Problem: Education: Goal: Knowledge of General Education information will improve Description: Including pain rating scale, medication(s)/side effects and non-pharmacologic comfort measures Outcome: Progressing   Problem: Health Behavior/Discharge Planning: Goal: Ability to manage health-related needs will improve Outcome: Progressing   Problem: Clinical Measurements: Goal: Diagnostic test results will improve Outcome: Progressing Goal: Respiratory complications will improve Outcome: Progressing Goal: Cardiovascular complication will be avoided Outcome: Progressing   Problem: Activity: Goal: Risk for activity intolerance will decrease Outcome: Progressing   Problem: Nutrition: Goal: Adequate nutrition will be maintained Outcome: Progressing   Problem: Coping: Goal: Level of anxiety will decrease Outcome: Progressing   Problem: Elimination: Goal: Will not experience complications related to bowel motility Outcome: Progressing Goal: Will not experience complications related to urinary retention Outcome: Progressing   Problem: Pain Managment: Goal: General experience of comfort will improve Outcome: Progressing   Problem: Safety: Goal: Ability to remain free from injury will improve Outcome: Progressing   Problem: Skin Integrity: Goal: Risk for impaired skin integrity will decrease Outcome: Progressing

## 2023-02-27 NOTE — Progress Notes (Signed)
Physical Therapy Evaluation Patient Details Name: [name] MRN: [id number] DOB: Feb 16, 1981 Today's Date: 02/27/2023  History of Present Illness  [name] is a 42 y.o. male with medical history significant of IDDM with insulin resistance, HTN, HLD, PVD, anxiety/depression, presented with worsening of right foot infection. Chronic L foot osteomyelitis, following with podiatry and wound care, chronic abx and barometric chamber. 7 days ago (+)new ulcer on the R foot plantar side of the forefoot, with rash, purulent discharge and swelling - started on antibiotics doxycycline 7 days ago but worsening, wound care sent him to ED. 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 PT order states NWB R LE,  heel transfers only in in post op shoe (clarified through instant message on 02/27/2023 by Dr. Excell Seltzer that he can weight bear through heel to walk but should not be on it long periods).   Clinical Impression  Patient received reclining in bed with mother Olegario Messier at bedside. Patient appears alert and oriented and able to provide detailed history. Prior to hospitalization patient was I with all aspects of mobility and care. He was uitlizing a post op shoe on the left foot after past surgery there and difficulty with wound healing. Upon PT evaluation, he was mod I for bed mobility, transfers, ambulation, and stairs utilizing R heel only weight bearing in post op shoe. He appeared to understand his precautions and was functionally independent. He does not appear to require further PT and is now discharged from PT with recommendations for frequent independent mobility, but not prolonged weight bearing on the right LE as recommended by Dr. Excell Seltzer.         If plan is discharge home, recommend the following: Assistance with cooking/housework   Can travel by private vehicle    yes    Equipment Recommendations Other (comment) (post op boot for right foot (he is currently using the  one he had for left foot but would benefit from continuing use on left foot).)  Recommendations for Other Services       Functional Status Assessment Patient has had a recent decline in their functional status and demonstrates the ability to make significant improvements in function in a reasonable and predictable amount of time.     Precautions / Restrictions Precautions Precautions: Fall Restrictions Weight Bearing Restrictions: Yes RLE Weight Bearing: Non weight bearing Other Position/Activity Restrictions: PT order states "NWB R LE, heel transfers only in in post op shoe" (clarified through instant message on 02/27/2023 by Dr. Excell Seltzer that he can weight bear through heel to walk but should not be on it long periods).      Mobility  Bed Mobility Overal bed mobility: Modified Independent                  Transfers Overall transfer level: Modified independent Equipment used: Rolling walker (2 wheels)               General transfer comment: sit <> stand to RW with R post op shoe, weight bearing on heel    Ambulation/Gait Ambulation/Gait assistance: Modified independent (Device/Increase time) Gait Distance (Feet): 230 Feet Assistive device: Rolling walker (2 wheels) Gait Pattern/deviations: Step-to pattern Gait velocity: slow     General Gait Details: Patient ambulated mod I with RW and R post op shoe with heel weight bearing using step to gait pattern  Stairs Stairs: Yes Stairs assistance: Modified independent (Device/Increase time) Stair Management: Two rails, Step to pattern, Sideways Number  of Stairs: 6 General stair comments: Patient able to maintain weight bearing precautions and balance without cuing  Wheelchair Mobility     Tilt Bed    Modified Rankin (Stroke Patients Only)       Balance Overall balance assessment: Independent, Modified Independent                                           Pertinent Vitals/Pain Pain  Assessment Pain Assessment: No/denies pain    Home Living Family/patient expects to be discharged to:: Private residence Living Arrangements: Other relatives (parents and dogs) Available Help at Discharge: Family;Available PRN/intermittently Type of Home: House Home Access: Stairs to enter Entrance Stairs-Rails: Can reach both;Right;Left Entrance Stairs-Number of Steps: 2 steps   Home Layout: One level Home Equipment: Agricultural consultant (2 wheels);Crutches Additional Comments: knee scooter,    Prior Function Prior Level of Function : Independent/Modified Independent             Mobility Comments: Patient reports he ambulated with no AD prior to hospitalization. He was wearing a post op shoe on the left LE since he had an amputation on that side. ADLs Comments: I with ALDs/IADLs, does not drive, denies falls     Extremity/Trunk Assessment   Upper Extremity Assessment Upper Extremity Assessment: Overall WFL for tasks assessed    Lower Extremity Assessment Lower Extremity Assessment: Overall WFL for tasks assessed    Cervical / Trunk Assessment Cervical / Trunk Assessment: Normal  Communication   Communication Communication: No apparent difficulties Cueing Techniques: Verbal cues;Visual cues;Gestural cues  Cognition Arousal: Alert Behavior During Therapy: WFL for tasks assessed/performed, Flat affect Overall Cognitive Status: Within Functional Limits for tasks assessed                                          General Comments General comments (skin integrity, edema, etc.): Patient donned/doffed R post op shoe and mobilized with RW without LOB    Exercises Other Exercises Other Exercises: Patient and mother Olegario Messier educated on role of PT in acute care setting, weight bearing restrictions, precautions, and discharge reccomendations.   Assessment/Plan    PT Assessment Patient does not need any further PT services  PT Problem List         PT  Treatment Interventions      PT Goals (Current goals can be found in the Care Plan section)  Acute Rehab PT Goals Patient Stated Goal: go home PT Goal Formulation: With patient Time For Goal Achievement: 03/13/23 Potential to Achieve Goals: Good    Frequency       Co-evaluation               AM-PAC PT "6 Clicks" Mobility  Outcome Measure Help needed turning from your back to your side while in a flat bed without using bedrails?: None Help needed moving from lying on your back to sitting on the side of a flat bed without using bedrails?: None Help needed moving to and from a bed to a chair (including a wheelchair)?: None Help needed standing up from a chair using your arms (e.g., wheelchair or bedside chair)?: None Help needed to walk in hospital room?: None Help needed climbing 3-5 steps with a railing? : None 6 Click Score: 24    End of  Session   Activity Tolerance: Patient tolerated treatment well Patient left: in bed;with family/visitor present Nurse Communication: Mobility status PT Visit Diagnosis: Difficulty in walking, not elsewhere classified (R26.2)    Time: 4166-0630 PT Time Calculation (min) (ACUTE ONLY): 17 min   Charges:   PT Evaluation $PT Eval Low Complexity: 1 Low   PT General Charges $$ ACUTE PT VISIT: 1 Visit         Huntley Dec R. Ilsa Iha, PT, DPT 02/27/23, 2:22 PM

## 2023-02-28 DIAGNOSIS — L089 Local infection of the skin and subcutaneous tissue, unspecified: Secondary | ICD-10-CM | POA: Diagnosis not present

## 2023-02-28 DIAGNOSIS — E11628 Type 2 diabetes mellitus with other skin complications: Secondary | ICD-10-CM

## 2023-02-28 LAB — GLUCOSE, CAPILLARY
Glucose-Capillary: 149 mg/dL — ABNORMAL HIGH (ref 70–99)
Glucose-Capillary: 221 mg/dL — ABNORMAL HIGH (ref 70–99)

## 2023-02-28 LAB — CREATININE, SERUM
Creatinine, Ser: 0.76 mg/dL (ref 0.61–1.24)
GFR, Estimated: >60 mL/min (ref >60)

## 2023-02-28 MED ORDER — RISAQUAD PO CAPS
1.0000 | ORAL_CAPSULE | Freq: Three times a day (TID) | ORAL | 0 refills | Status: DC
  Filled 2023-02-28 – 2023-07-21 (×3): qty 90, 30d supply, fill #0

## 2023-02-28 MED ORDER — AMOXICILLIN-POT CLAVULANATE 875-125 MG PO TABS
1.0000 | ORAL_TABLET | Freq: Once | ORAL | Status: AC
  Administered 2023-02-28: 1 via ORAL
  Filled 2023-02-28: qty 1

## 2023-02-28 MED ORDER — ACETAMINOPHEN 325 MG PO TABS: 650.0000 mg | ORAL_TABLET | Freq: Four times a day (QID) | ORAL | Status: DC | PRN

## 2023-02-28 MED ORDER — INSULIN ASPART 100 UNIT/ML FLEXPEN
5.0000 [IU] | PEN_INJECTOR | Freq: Three times a day (TID) | SUBCUTANEOUS | 0 refills | Status: AC
Start: 2023-02-28 — End: ?
  Filled 2023-02-28: qty 15, fill #0
  Filled 2023-03-01: qty 3, 20d supply, fill #0
  Filled 2023-07-21: qty 3, 20d supply, fill #1

## 2023-02-28 MED ORDER — SENNOSIDES-DOCUSATE SODIUM 8.6-50 MG PO TABS: 1.0000 | ORAL_TABLET | Freq: Every evening | ORAL | Status: DC | PRN

## 2023-02-28 MED ORDER — AMOXICILLIN-POT CLAVULANATE 875-125 MG PO TABS
1.0000 | ORAL_TABLET | Freq: Two times a day (BID) | ORAL | 0 refills | Status: AC
Start: 2023-02-28 — End: 2023-03-15
  Filled 2023-02-28 – 2023-03-01 (×2): qty 28, 14d supply, fill #0

## 2023-02-28 MED ORDER — OXYCODONE HCL 5 MG PO TABS
5.0000 mg | ORAL_TABLET | ORAL | 0 refills | Status: DC | PRN
  Filled 2023-02-28 – 2023-03-01 (×2): qty 30, 5d supply, fill #0

## 2023-02-28 NOTE — Discharge Summary (Signed)
Physician Discharge Summary   Patient: [name] MRN: [id number]  DOB: 1980-09-09   Admit:     Date of Admission: 02/24/2023 Admitted from: home   Discharge: Date of discharge: 02/28/23 Disposition: Home Condition at discharge: good  CODE STATUS: FULL CODE     Discharge Physician: Sunnie Nielsen, DO [facility]     PCP: Marcine Matar, MD  Recommendations for Outpatient Follow-up:  Follow up with PCP Marcine Matar, MD in 1-2 weeks Please follow up on the following pending results: final culture results and surgical pathology PCP AND OTHER OUTPATIENT PROVIDERS: SEE BELOW FOR SPECIFIC DISCHARGE INSTRUCTIONS PRINTED FOR PATIENT IN ADDITION TO GENERIC AVS PATIENT INFO     Discharge Instructions     Call MD for:  redness, tenderness, or signs of infection (pain, swelling, redness, odor or green/yellow discharge around incision site)   Complete by: As directed    Call MD for:  severe uncontrolled pain   Complete by: As directed    Call MD for:  temperature >100.4   Complete by: As directed    Diet - low sodium heart healthy   Complete by: As directed    Diet Carb Modified   Complete by: As directed    Increase activity slowly   Complete by: As directed          Discharge Diagnoses: Principal Problem:   Osteomyelitis (HCC) Active Problems:   Essential hypertension   Venous insufficiency of both lower extremities   Uncontrolled type 2 diabetes mellitus with hyperglycemia (HCC)   Ulcer of right foot with fat layer exposed (HCC)   Non-pressure chronic ulcer of other part of left foot limited to breakdown of skin (HCC)   Status post amputation of left great toe (HCC)   MDD (major depressive disorder), recurrent, in partial remission (HCC)   Diabetic foot infection [facility])       Hospital Course:  HPI: [name] is a 42 y.o. male with medical history significant of IDDM with insulin resistance, HTN, HLD, PVD,  anxiety/depression, presented with worsening of right foot infection. Chronic L foot osteomyelitis, following with podiatry and wound care, chronic abx and barometric chamber. 7 days ago (+)new ulcer on the R foot plantar side of the forefoot, with rash, purulent discharge and swelling - started on antibiotics doxycycline 7 days ago but worsening, wound care sent him to ED   Hospital course / significant events:  10/02:  admitted to hospitalist service w/ podiatry consult. Started vanc, Cefepime, Flagyl. Podiatry did some debridement at bedside, planning for amputation tomorrow, pending MRI and ABI, pending DVT US  10/03: amputation R 1st toe and bx L 5th toe. ID consult - await cultures to guide abx  10/04-10/05: awaiting cultures and surgical pathology to guide abx treatment which may involve long term IV abx 10/06: confirm w/ ID and podiatry for po augmentin and ok for d/c to follow up w/ podiatry outpatient for final cx/path results     Consultants:  Podiatry  Infectious Disease   Procedures/Surgeries: 10/02: debridement of ulcers Right first metatarsal plantar, Left plantar first metatarsal, Left plantar fourth metatarsal phalangeal joint, Left lateral fifth metatarsal phalange joint - Dr Excell Seltzer, podiatry  10/03: Right partial first ray amputation with antibiotic bead application, and Left fifth metatarsal phalangeal joint bone biopsy       ASSESSMENT & PLAN:   Osteomyelitis - chronic L foot, potential new R foot - POA Right foot diabetic ulcer infection, failed outpatient  management - POA S/p Right partial first ray amputation with antibiotic bead application, and Left fifth metatarsal phalangeal joint bone biopsy 02/25/23  DVT US: neg for DVT BL LE ABI: no concerns  Augmentin 875 po bid x14d R on dc Follow w/ podiatry as directed    Peripheral vascular disease ABI no concerns    IDDM with hyperglycemia Significant insulin resistance Continue basaglar and novolog at home see  med rec Restart home metformin Pt unable to afford GLP1 meds, to address w/ PCP          Discharge Instructions  Allergies as of 02/28/2023       Reactions   Milk-related Compounds Diarrhea        Medication List     STOP taking these medications    ciprofloxacin 500 MG tablet Commonly known as: CIPRO   doxycycline 100 MG capsule Commonly known as: MONODOX   doxycycline 100 MG tablet Commonly known as: VIBRA-TABS   Trulicity 0.75 MG/0.5ML Sopn Generic drug: Dulaglutide       TAKE these medications    acetaminophen 325 MG tablet Commonly known as: TYLENOL Take 2 tablets (650 mg total) by mouth every 6 (six) hours as needed for mild pain or moderate pain (or Fever >/= 101).   acidophilus Caps capsule Take 1 capsule by mouth 3 (three) times daily.   amoxicillin-clavulanate 875-125 MG tablet Commonly known as: AUGMENTIN Take 1 tablet by mouth 2 (two) times daily for 14 days.   atomoxetine 80 MG capsule Commonly known as: Strattera Take 1 capsule (80 mg total) by mouth daily.   Basaglar KwikPen 100 UNIT/ML Inject 60 Units into the skin at bedtime.   blood glucose meter kit and supplies Dispense based on patient and insurance preference. Use up to four times daily as directed. (FOR ICD-10 E10.9, E11.9).   insulin aspart 100 UNIT/ML FlexPen Commonly known as: NOVOLOG Inject 5 Units into the skin 3 (three) times daily with meals.   metFORMIN 500 MG 24 hr tablet Commonly known as: GLUCOPHAGE-XR Take 4 tablets (2,000 mg total) by mouth daily with breakfast.   oxyCODONE 5 MG immediate release tablet Commonly known as: Oxy IR/ROXICODONE Take 1 tablet (5 mg total) by mouth every 4 (four) hours as needed for moderate pain or severe pain.   Pen Needles 31G X 8 MM Misc UAD   pravastatin 40 MG tablet Commonly known as: PRAVACHOL Take 1 tablet (40 mg total) by mouth once daily.   senna-docusate 8.6-50 MG tablet Commonly known as: Senokot-S Take 1  tablet by mouth at bedtime as needed for mild constipation.   sertraline 50 MG tablet Commonly known as: ZOLOFT Take 1 tablet (50 mg total) by mouth daily.         Follow-up Information     Rosetta Posner, DPM. Schedule an appointment as soon as possible for a visit in 1 week(s).   Specialty: Podiatry Why: For wound re-check Contact information: [address] [place] [address] [phone]                 Allergies  Allergen Reactions   Milk-Related Compounds Diarrhea     Subjective: pt reports pain is controlled, eager to go home, no CP/SOB, ambulating ok   Discharge Exam: BP 131/76 (BP Location: Right Arm)   Pulse 85   Temp 97.8 F (36.6 C) (Oral)   Resp 15   Ht 6\' 1"  (1.854 m)   Wt (!) 145.2 kg   SpO2 100%  BMI 42.23 kg/m  General: Pt is alert, awake, not in acute distress Cardiovascular: RRR, S1/S2 +, no rubs, no gallops Respiratory: CTA bilaterally, no wheezing, no rhonchi Abdominal: Soft, NT, ND, bowel sounds + Extremities: no edema, no cyanosis     The results of significant diagnostics from this hospitalization (including imaging, microbiology, ancillary and laboratory) are listed below for reference.     Microbiology: Recent Results (from the past 240 hour(s))  Blood culture (single)     Status: None (Preliminary result)   Collection Time: 02/24/23 12:44 PM   Specimen: BLOOD  Result Value Ref Range Status   Specimen Description BLOOD RIGHT ANTECUBITAL  Final   Special Requests   Final    BOTTLES DRAWN AEROBIC AND ANAEROBIC Blood Culture adequate volume   Culture   Final    NO GROWTH 4 DAYS Performed at [facility], [address] [name], [place] [address]    Report Status PENDING  Incomplete  Blood culture (single)     Status: None (Preliminary result)   Collection Time: 02/24/23  2:56 PM   Specimen: BLOOD  Result Value Ref Range Status   Specimen Description BLOOD BLOOD LEFT FOREARM  Final   Special  Requests   Final    BOTTLES DRAWN AEROBIC AND ANAEROBIC Blood Culture adequate volume   Culture   Final    NO GROWTH 4 DAYS Performed at [facility], [address] [place], [place] [address]    Report Status PENDING  Incomplete  Surgical pcr screen     Status: None   Collection Time: 02/24/23 10:29 PM   Specimen: Nasal Mucosa; Nasal Swab  Result Value Ref Range Status   MRSA, PCR NEGATIVE NEGATIVE Final   Staphylococcus aureus NEGATIVE NEGATIVE Final    Comment: (NOTE) The Xpert SA Assay (FDA approved for NASAL specimens in patients 77 years of age and older), is one component of a comprehensive surveillance program. It is not intended to diagnose infection nor to guide or monitor treatment. Performed at [facility], [address] [place], [place] [address]   Aerobic/Anaerobic Culture w Gram Stain (surgical/deep wound)     Status: None (Preliminary result)   Collection Time: 02/25/23  1:11 PM   Specimen: Path fluid; Body Fluid  Result Value Ref Range Status   Specimen Description   Final    WOUND Performed at [facility], [address], [place], [place] [address]    Special Requests   Final    FLDP Performed at [facility], [address] [place], [place] [address]    Gram Stain   Final    RARE WBC PRESENT, PREDOMINANTLY PMN FEW GRAM POSITIVE COCCI Performed at [name], [address], [place], [place] [address]    Culture   Final    MODERATE GROUP B STREP(S.AGALACTIAE)ISOLATED TESTING AGAINST S. AGALACTIAE NOT ROUTINELY PERFORMED DUE TO PREDICTABILITY OF AMP/PEN/VAN SUSCEPTIBILITY. NO ANAEROBES ISOLATED; CULTURE IN PROGRESS FOR 5 DAYS    Report Status PENDING  Incomplete  Aerobic/Anaerobic Culture w Gram Stain (surgical/deep wound)     Status: None (Preliminary result)   Collection Time: 02/25/23  1:18 PM   Specimen: Path Tissue  Result Value Ref Range Status   Specimen Description   Final     TISSUE Performed at [facility], [address], [place], [place] [address]    Special Requests   Final    NONE Performed at [facility], [address] [place],  [place] [address]    Gram Stain   Final    NO WBC SEEN NO ORGANISMS SEEN Performed at [facility], [address] [place], [place] [address]    Culture   Final    RARE GROUP B STREP(S.AGALACTIAE)ISOLATED TESTING AGAINST S. AGALACTIAE NOT ROUTINELY PERFORMED DUE TO PREDICTABILITY OF AMP/PEN/VAN SUSCEPTIBILITY. NO ANAEROBES ISOLATED; CULTURE IN PROGRESS FOR 5 DAYS    Report Status PENDING  Incomplete  Aerobic/Anaerobic Culture w Gram Stain (surgical/deep wound)     Status: None (Preliminary result)   Collection Time: 02/25/23  1:26 PM   Specimen: Path Tissue  Result Value Ref Range Status   Specimen Description   Final    WOUND Performed at [facility], [address] [place], [place] [address]    Special Requests   Final    TISP Performed at [facility], [address] [place], [place] [address]    Gram Stain   Final    NO WBC SEEN NO ORGANISMS SEEN Performed at [facility], [address] [place], [place] [address]    Culture   Final    RARE GROUP B STREP(S.AGALACTIAE)ISOLATED TESTING AGAINST S. AGALACTIAE NOT ROUTINELY PERFORMED DUE TO PREDICTABILITY OF AMP/PEN/VAN SUSCEPTIBILITY. NO ANAEROBES ISOLATED; CULTURE IN PROGRESS FOR 5 DAYS    Report Status PENDING  Incomplete     Labs: BNP (last 3 results) No results for input(s): "BNP" in the last 8760 hours. Basic Metabolic Panel: Recent Labs  Lab 02/24/23 1244 02/25/23 0325 02/28/23 0440  NA 134* 136  --   K 4.1 4.2  --   CL 97* 104  --   CO2 25 25  --   GLUCOSE 345* 204*  --   BUN 14 13  --   CREATININE 0.82 0.68 0.76  CALCIUM 9.2 8.5*  --    Liver Function Tests: Recent Labs  Lab 02/24/23 1244  AST 13*  ALT 15  ALKPHOS 95  BILITOT 1.7*  PROT 9.2*  ALBUMIN 4.3   No results  for input(s): "LIPASE", "AMYLASE" in the last 168 hours. No results for input(s): "AMMONIA" in the last 168 hours. CBC: Recent Labs  Lab 02/24/23 1244 02/25/23 0325  WBC 13.7* 11.3*  NEUTROABS 9.2*  --   HGB 15.6 13.6  HCT 45.9 40.6  MCV 81.7 81.5  PLT 341 290   Cardiac Enzymes: No results for input(s): "CKTOTAL", "CKMB", "CKMBINDEX", "TROPONINI" in the last 168 hours. BNP: Invalid input(s): "POCBNP" CBG: Recent Labs  Lab 02/27/23 1151 02/27/23 1636 02/27/23 2211 02/28/23 0750 02/28/23 1200  GLUCAP 172* 195* 171* 149* 221*   D-Dimer No results for input(s): "DDIMER" in the last 72 hours. Hgb A1c No results for input(s): "HGBA1C" in the last 72 hours. Lipid Profile No results for input(s): "CHOL", "HDL", "LDLCALC", "TRIG", "CHOLHDL", "LDLDIRECT" in the last 72 hours. Thyroid function studies No results for input(s): "TSH", "T4TOTAL", "T3FREE", "THYROIDAB" in the last 72 hours.  Invalid input(s): "FREET3" Anemia work up No results for input(s): "VITAMINB12", "FOLATE", "FERRITIN", "TIBC", "IRON", "RETICCTPCT" in the last 72 hours. Urinalysis    Component Value Date/Time   COLORURINE YELLOW (A) 02/24/2023 1611   APPEARANCEUR HAZY (A) 02/24/2023 1611   LABSPEC 1.033 (H) 02/24/2023 1611   PHURINE 5.0 02/24/2023 1611   GLUCOSEU >=500 (A) 02/24/2023 1611   HGBUR NEGATIVE 02/24/2023 1611   BILIRUBINUR NEGATIVE 02/24/2023 1611   BILIRUBINUR negative 08/10/2016 1116   KETONESUR 20 (A) 02/24/2023 1611   PROTEINUR NEGATIVE 02/24/2023  1611   UROBILINOGEN 0.2 08/10/2016 1116   NITRITE NEGATIVE 02/24/2023 1611   LEUKOCYTESUR NEGATIVE 02/24/2023 1611   Sepsis Labs Recent Labs  Lab 02/24/23 1244 02/25/23 0325  WBC 13.7* 11.3*   Microbiology Recent Results (from the past 240 hour(s))  Blood culture (single)     Status: None (Preliminary result)   Collection Time: 02/24/23 12:44 PM   Specimen: BLOOD  Result Value Ref Range Status   Specimen Description BLOOD RIGHT  ANTECUBITAL  Final   Special Requests   Final    BOTTLES DRAWN AEROBIC AND ANAEROBIC Blood Culture adequate volume   Culture   Final    NO GROWTH 4 DAYS Performed at [facility], [address] [place], [place] [address]    Report Status PENDING  Incomplete  Blood culture (single)     Status: None (Preliminary result)   Collection Time: 02/24/23  2:56 PM   Specimen: BLOOD  Result Value Ref Range Status   Specimen Description BLOOD BLOOD LEFT FOREARM  Final   Special Requests   Final    BOTTLES DRAWN AEROBIC AND ANAEROBIC Blood Culture adequate volume   Culture   Final    NO GROWTH 4 DAYS Performed at [facility], [address] [place], [place] [address]    Report Status PENDING  Incomplete  Surgical pcr screen     Status: None   Collection Time: 02/24/23 10:29 PM   Specimen: Nasal Mucosa; Nasal Swab  Result Value Ref Range Status   MRSA, PCR NEGATIVE NEGATIVE Final   Staphylococcus aureus NEGATIVE NEGATIVE Final    Comment: (NOTE) The Xpert SA Assay (FDA approved for NASAL specimens in patients 19 years of age and older), is one component of a comprehensive surveillance program. It is not intended to diagnose infection nor to guide or monitor treatment. Performed at [facility], [address] [place], [place] [address]   Aerobic/Anaerobic Culture w Gram Stain (surgical/deep wound)     Status: None (Preliminary result)   Collection Time: 02/25/23  1:11 PM   Specimen: Path fluid; Body Fluid  Result Value Ref Range Status   Specimen Description   Final    WOUND Performed at [facility], [address], [place], [place] [address]    Special Requests   Final    FLDP Performed at [facility], [address] [place], [place] [address]    Gram Stain   Final    RARE WBC PRESENT, PREDOMINANTLY PMN FEW GRAM POSITIVE COCCI Performed at [facility], [address], [place], [place] [address]    Culture   Final     MODERATE GROUP B STREP(S.AGALACTIAE)ISOLATED TESTING AGAINST S. AGALACTIAE NOT ROUTINELY PERFORMED DUE TO PREDICTABILITY OF AMP/PEN/VAN SUSCEPTIBILITY. NO ANAEROBES ISOLATED; CULTURE IN PROGRESS FOR 5 DAYS    Report Status PENDING  Incomplete  Aerobic/Anaerobic Culture w Gram Stain (surgical/deep wound)     Status: None (Preliminary result)   Collection Time: 02/25/23  1:18 PM   Specimen: Path Tissue  Result Value Ref Range Status   Specimen Description   Final    TISSUE Performed at [facility], [address] [place], [place] [address]    Special Requests   Final    NONE Performed at [facility], [address] [place], [place] [address]    Gram Stain   Final    NO WBC SEEN NO ORGANISMS SEEN Performed at [facility], [address], [place],  [place] [address]    Culture   Final    RARE GROUP B STREP(S.AGALACTIAE)ISOLATED TESTING AGAINST S. AGALACTIAE NOT ROUTINELY PERFORMED DUE TO PREDICTABILITY OF AMP/PEN/VAN SUSCEPTIBILITY. NO ANAEROBES ISOLATED; CULTURE IN PROGRESS FOR 5 DAYS    Report Status PENDING  Incomplete  Aerobic/Anaerobic Culture w Gram Stain (surgical/deep wound)     Status: None (Preliminary result)   Collection Time: 02/25/23  1:26 PM   Specimen: Path Tissue  Result Value Ref Range Status   Specimen Description   Final    WOUND Performed at [name], [address], [place], [place] [address]    Special Requests   Final    TISP Performed at [facility], [address] [place], [place] [address]    Gram Stain   Final    NO WBC SEEN NO ORGANISMS SEEN Performed at [facility], [address] [name], [place] [address]    Culture   Final    RARE GROUP B STREP(S.AGALACTIAE)ISOLATED TESTING AGAINST S. AGALACTIAE NOT ROUTINELY PERFORMED DUE TO PREDICTABILITY OF AMP/PEN/VAN SUSCEPTIBILITY. NO ANAEROBES ISOLATED; CULTURE IN PROGRESS FOR 5 DAYS    Report Status PENDING  Incomplete    Imaging DG Foot Complete Right  Result Date: 02/25/2023 CLINICAL DATA:  Postoperative check. Status post partial first ray amputation. EXAM: RIGHT FOOT COMPLETE - 3+ VIEW COMPARISON:  Right foot radiographs 02/24/2023 and MRI right forefoot 02/24/2023 FINDINGS: Interval progressive amputation of the first ray, now to the distal shaft of the first metatarsal. There are numerous antibiotic beads overlying the distal aspect the remaining first metatarsal on the distal soft tissues. No significant change in flattening of the second metatarsal head and widening of the second metatarsophalangeal joint space. Mild third metatarsophalangeal joint space narrowing and lateral spurring. Moderate to severe plantar and moderate posterior calcaneal heel spurs. Mild-to-moderate dorsal midfoot degenerative osteophytes. IMPRESSION: Interval progressive amputation of the first ray, now to the distal shaft of the first metatarsal. No unexpected findings. Electronically Signed   By: Neita Garnet M.D.   On: 02/25/2023 15:07   MR FOOT RIGHT WO CONTRAST  Result Date: 02/25/2023 CLINICAL DATA:  Osteonecrosis suspected, foot, xray done History of diabetes with polyneuropathy and previous toe amputations. Bilateral foot ulcerations. EXAM: MRI OF THE RIGHT FOREFOOT WITHOUT CONTRAST TECHNIQUE: Multiplanar, multisequence MR imaging of the right forefoot was performed. No intravenous contrast was administered. COMPARISON:  Radiographs 02/24/2023 and 02/18/2023.  MRI 07/09/2021. FINDINGS: Technical note: Coronal (long axis axial) T1 weighted images were mistakenly not performed. The short axis axial T1 weighted images were duplicated. Despite efforts by the technologist and patient, mild motion artifact is present on today's exam and could not be eliminated. This reduces exam sensitivity and specificity. Bones/Joint/Cartilage Since the previous MRI, the great toe has been amputated at the metatarsophalangeal joint. New nonspecific  low-level T2 hyperintensity within the 1st metatarsal head without corresponding T1 marrow signal abnormality or cortical destruction. No other suspicious marrow edema. As demonstrated on recent radiographs, there is flattening of the 2nd, 3rd and 4th metatarsal heads which is new from the previous MRI and stable from recent radiographs. Associated cortical thickening of the 2nd metatarsal neck, widening of the 2nd metatarsophalangeal joint and probable 6 mm bony fragment within the joint (image 17/7). Mild midfoot degenerative changes. No significant joint effusions. Ligaments The Lisfranc ligament is intact. The collateral ligaments of the remaining metatarsophalangeal joints appear intact. Muscles and Tendons Generalized forefoot muscular atrophy. No focal intramuscular fluid collection. No significant tenosynovitis. Soft  tissues Irregular soft tissue ulcer extending distally from the amputated great toe with underlying ill-defined T2 hyperintensity within the soft tissues consistent with a phlegmon. No well-defined/drainable abscess. Generalized subcutaneous edema throughout the dorsal aspect of the forefoot. IMPRESSION: 1. Irregular soft tissue ulcer extending distally from the amputated great toe with underlying soft tissue phlegmon. No well-defined/drainable abscess. 2. New nonspecific low-level T2 hyperintensity within the 1st metatarsal head without corresponding T1 marrow signal abnormality or cortical destruction. This may be reactive, although given proximity to the overlying soft tissue findings is concerning for early osteomyelitis. 3. Flattening of the 2nd, 3rd and 4th metatarsal heads with widening of the 2nd metatarsophalangeal joint and probable 6 mm bony fragment within the joint. These findings are new from previous MRI and could be secondary to neuropathic arthropathy. No evidence of acute fracture. 4. Generalized subcutaneous edema throughout the dorsal aspect of the forefoot. Electronically  Signed   By: Carey Bullocks M.D.   On: 02/25/2023 11:02   MR FOOT LEFT WO CONTRAST  Result Date: 02/25/2023 CLINICAL DATA:  Osteonecrosis suspected, foot, xray done History of diabetes with polyneuropathy and previous toe amputations. Bilateral foot ulcerations. EXAM: MRI OF THE LEFT FOOT WITHOUT CONTRAST TECHNIQUE: Multiplanar, multisequence MR imaging of the left forefoot was performed. No intravenous contrast was administered. COMPARISON:  Radiographs 02/18/2023 and 12/02/2022.  MRI 03/02/2020. FINDINGS: Bones/Joint/Cartilage Previous amputation of the great toe at the metatarsophalangeal joint. As seen on recent radiographs, there is new irregular joint space widening at the 5th metatarsophalangeal joint associated with underlying erosive changes and marrow edema within the 5th metatarsal head and base of the 5th proximal phalanx. These findings are consistent with underlying osteomyelitis. T2 hyperintensity within the 5th metatarsal extends to the proximal metadiaphysis, although the T1 marrow signal abnormality is isolated to the distal portion of the metatarsal. There is underlying chronic fracture through the 5th metatarsal neck. No other evidence of osteomyelitis, acute fracture or dislocation. Advanced arthropathy at the 2nd metatarsophalangeal joint with chronic flattening of the 2nd metatarsal head, stable from recent radiographs, but new from remote MRI. Mild midfoot degenerative changes without subluxation. No significant joint effusions. Ligaments Intact Lisfranc ligament. The collateral ligaments of the remaining metatarsophalangeal joints appear intact. Muscles and Tendons Generalized forefoot muscular atrophy without focal fluid collection. No significant tenosynovitis. Soft tissues Suspected soft tissue ulceration lateral to the 5th metatarsophalangeal joint with associated ill-defined fluid and diffuse soft tissue swelling. No focal drainable fluid collection identified. IMPRESSION: 1.  Suspected soft tissue ulceration lateral to the 5th metatarsophalangeal joint with underlying osteomyelitis of the 5th metatarsal head and base of the 5th proximal phalanx, as described. 2. No focal drainable fluid collection identified. 3. Advanced arthropathy at the 2nd metatarsophalangeal joint with chronic flattening of the 2nd metatarsal head. 4. Previous amputation of the great toe at the metatarsophalangeal joint. Electronically Signed   By: Carey Bullocks M.D.   On: 02/25/2023 10:23   US Venous Img Lower Bilateral (DVT)  Result Date: 02/25/2023 CLINICAL DATA:  Bilateral lower extremity edema EXAM: BILATERAL LOWER EXTREMITY VENOUS DOPPLER ULTRASOUND TECHNIQUE: Gray-scale sonography with graded compression, as well as color Doppler and duplex ultrasound were performed to evaluate the lower extremity deep venous systems from the level of the common femoral vein and including the common femoral, femoral, profunda femoral, popliteal and calf veins including the posterior tibial, peroneal and gastrocnemius veins when visible. The superficial great saphenous vein was also interrogated. Spectral Doppler was utilized to evaluate flow at rest and with distal  augmentation maneuvers in the common femoral, femoral and popliteal veins. COMPARISON:  None Available. FINDINGS: RIGHT LOWER EXTREMITY Common Femoral Vein: No evidence of thrombus. Normal compressibility, respiratory phasicity and response to augmentation. Saphenofemoral Junction: No evidence of thrombus. Normal compressibility and flow on color Doppler imaging. Profunda Femoral Vein: No evidence of thrombus. Normal compressibility and flow on color Doppler imaging. Femoral Vein: No evidence of thrombus. Normal compressibility, respiratory phasicity and response to augmentation. Popliteal Vein: No evidence of thrombus. Normal compressibility, respiratory phasicity and response to augmentation. Calf Veins: No evidence of thrombus. Normal compressibility and  flow on color Doppler imaging. Superficial Great Saphenous Vein: No evidence of thrombus. Normal compressibility. Venous Reflux:  None. Other Findings:  None. LEFT LOWER EXTREMITY Common Femoral Vein: No evidence of thrombus. Normal compressibility, respiratory phasicity and response to augmentation. Saphenofemoral Junction: No evidence of thrombus. Normal compressibility and flow on color Doppler imaging. Profunda Femoral Vein: No evidence of thrombus. Normal compressibility and flow on color Doppler imaging. Femoral Vein: No evidence of thrombus. Normal compressibility, respiratory phasicity and response to augmentation. Popliteal Vein: No evidence of thrombus. Normal compressibility, respiratory phasicity and response to augmentation. Calf Veins: No evidence of thrombus. Normal compressibility and flow on color Doppler imaging. Superficial Great Saphenous Vein: No evidence of thrombus. Normal compressibility. Venous Reflux:  None. Other Findings:  None. IMPRESSION: No evidence of deep venous thrombosis in either lower extremity. Electronically Signed   By: Malachy Moan M.D.   On: 02/25/2023 10:04   DG Foot Complete Right  Result Date: 02/24/2023 CLINICAL DATA:  Worsening right foot wound EXAM: RIGHT FOOT COMPLETE - 3 VIEW COMPARISON:  Right foot radiographs dated 02/18/2023 FINDINGS: Postsurgical changes from amputation of the great toe. Similar appearance of soft tissue wound at the amputation site. There is no evidence of fracture or dislocation. Similar cortical thickening of the second metatarsal shaft and widened second metatarsophalangeal joint space. No focal cortical lucency. Degenerative changes of the foot. Diffuse soft tissue edema of the forefoot. Soft tissues are unremarkable. IMPRESSION: 1. Postsurgical changes from amputation of the great toe. Similar appearance of soft tissue wound at the amputation site. 2. Similar cortical thickening of the second metatarsal shaft and widened second  metatarsophalangeal joint space. No focal cortical lucency to suggest osteomyelitis. Electronically Signed   By: Agustin Cree M.D.   On: 02/24/2023 14:11      Time coordinating discharge: over 30 minutes  SIGNED:  Sunnie Nielsen DO [facility]

## 2023-02-28 NOTE — Plan of Care (Signed)

## 2023-02-28 NOTE — Progress Notes (Signed)
Reviewed discharge instructions with pt. Pt verbalized understanding. Administered first dose oral antibiotic, Augmentin, at 13:24 PM. Pt discharged with OrthoWedge shoe and all personal belongings. Staff wheeled pt out. Pt transported to home via family car.

## 2023-02-28 NOTE — Plan of Care (Signed)
  Problem: Fluid Volume: Goal: Ability to maintain a balanced intake and output will improve Outcome: Progressing   Problem: Nutritional: Goal: Maintenance of adequate nutrition will improve Outcome: Progressing   Problem: Skin Integrity: Goal: Risk for impaired skin integrity will decrease Outcome: Progressing   Problem: Tissue Perfusion: Goal: Adequacy of tissue perfusion will improve Outcome: Progressing   Problem: Education: Goal: Knowledge of General Education information will improve Description: Including pain rating scale, medication(s)/side effects and non-pharmacologic comfort measures Outcome: Progressing   Problem: Clinical Measurements: Goal: Diagnostic test results will improve Outcome: Progressing Goal: Respiratory complications will improve Outcome: Progressing Goal: Cardiovascular complication will be avoided Outcome: Progressing   Problem: Activity: Goal: Risk for activity intolerance will decrease Outcome: Progressing   Problem: Nutrition: Goal: Adequate nutrition will be maintained Outcome: Progressing   Problem: Coping: Goal: Level of anxiety will decrease Outcome: Progressing   Problem: Elimination: Goal: Will not experience complications related to bowel motility Outcome: Progressing Goal: Will not experience complications related to urinary retention Outcome: Progressing   Problem: Pain Managment: Goal: General experience of comfort will improve Outcome: Progressing   Problem: Safety: Goal: Ability to remain free from injury will improve Outcome: Progressing   Problem: Skin Integrity: Goal: Risk for impaired skin integrity will decrease Outcome: Progressing

## 2023-03-01 ENCOUNTER — Ambulatory Visit: Payer: 59 | Admitting: Pharmacist

## 2023-03-01 ENCOUNTER — Other Ambulatory Visit: Payer: Self-pay

## 2023-03-01 ENCOUNTER — Telehealth: Payer: Self-pay

## 2023-03-01 LAB — CULTURE, BLOOD (SINGLE)
Culture: NO GROWTH
Culture: NO GROWTH
Special Requests: ADEQUATE
Special Requests: ADEQUATE

## 2023-03-01 NOTE — Progress Notes (Deleted)
S:     No chief complaint on file.  42 y.o. male who presents for diabetes evaluation, education, and management.   Patient was referred and last seen by Primary Care Provider, Dr. Laural Benes, on 12/31/22. 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   At last visit w/ Dr. Laural Benes on 12/31/22, A1c was 11.9%. Patient reported being off his DM meds for several months d/t cost & mental health. Reported not checking BG at home, continued dietary indiscretion. Marcelline Deist was d/c'd (wasn't taking), lantus was restarted at 30 units daily (48 units previously), and Trulicity was restarted at 0.75 mg weekly (fill records indicate never picked up).  Last pharmacist visit on 8/29. Patient reported adherence to basaglar 50 units daily in the evening and metformin XR 500 mg- 4 tablets in the evening. Unable to afford Trulicity due to $950 co-pay. Patient reported random and 2 hr PP BG 150 to low 200s. Reported checking BG at hyperbaric treatment center because BG must be >132 to be authorized for this treatment. Noted that hyperbaric treatment drops BG by 40-50 points. This was also a barrier to CGM because he cannot wear a CGM during hyperbaric treatment. Instructed patient to increase Basaglar to 60 units daily and continue metformin with plan to contact pharmacy to determine reason for high cost of Trulicity.   Since that visit, patient has followed with wound care for chronic osteomyelitis. On 10/2, patient reported a new ulcer on the right foot with associated swelling, pain, purulent discharge, and chills. Symptoms did not improve with doxycycline and he was sent to the ED. He underwent debridement of ulcers and right partial first ray amputation on 10/3. Discharged on metformin, Basaglar 60 units daily, and Novolog 5 units TID with meals. Of note, fill hx shows metformin  and Basaglar are overdue to be filled and Novolog has not been filled.  Today ***  A1C =9.6 on 10/2 (down from 11.9) Review medications and adherence (timing of meds, etc.)  Side effects with meds? At home BGs?  Highs Lows  Hyperglycemia sx (nocturia, neuropathy, visual changes, foot exams) Hypoglycemia symptoms (dizziness, shaky, sweating, hungry, confusion) Diet Exercise   Family/Social History:  Fhx: DM, HLD, HTN  Tobacco: never Alcohol: no current use  Current diabetes medications include: metformin XR 500 mg (takes 4 tablets--2000 mg once daily), basaglar 60 units daily in the evening, Novolog 5 units TID AC Current hypertension medications include: none Current hyperlipidemia medications include: pravastatin 40 mg daily  Have you been experiencing any side effects to the medications prescribed? no Do you have any problems obtaining medications due to transportation or finances? YES--Trulicity had a $950 copay Insurance coverage: [employer]  Med Adherence: reports 2-3 missed doses of Basaglar & metformin since 12/31/22.   Patient denies hypoglycemic events.  Reported 2 hour post-meal/random blood sugars: 150-low 200s. Checks at hyperbaric treatment center BID. Mentioned BG must be >132 to be authorized for treatment b/c hyperbaric treatment drops BG by 40-50 points.   Patient reports improvement in hyperglycemia symptoms since beginning insulin & metformin on 12/31/22 Patient denies nocturia (nighttime urination).  Patient denies neuropathy (nerve pain). Patient reports visual changes. Improved since starting meds Patient reports polydipsia  Patient reported dietary habits: Eats 2-3 meals/day. Wants to lose 75-100 pounds.  Breakfast: breakfast sandwich Lunch: typically light Dinner: biggest meal of day.  Whatever family cooks. Mentioned foods like stewed beef, spaghetti, brown rice Snacks: sugar-free jello, freezer pops Drinks: regular pepsi, dr. Lora Havens. Cutting back on sugary  drinks  Patient-reported exercise habits: not discussed this visit  O:   ROS  Physical Exam  Lab Results  Component Value Date   HGBA1C 9.6 (H) 02/24/2023   There were no vitals filed for this visit.  Lipid Panel     Component Value Date/Time   CHOL 212 (H) 12/31/2022 1519   TRIG 216 (H) 12/31/2022 1519   HDL 35 (L) 12/31/2022 1519   CHOLHDL 6.1 (H) 12/31/2022 1519   LDLCALC 138 (H) 12/31/2022 1519    Clinical Atherosclerotic Cardiovascular Disease (ASCVD): No  The 10-year ASCVD risk score (Arnett DK, et al., 2019) is: 5.7%   Values used to calculate the score:     Age: 49 years     Sex: Male     Is Non-Hispanic African American: No     Diabetic: Yes     Tobacco smoker: No     Systolic Blood Pressure: 131 mmHg     Is BP treated: No     HDL Cholesterol: 35 mg/dL     Total Cholesterol: 212 mg/dL   Patient is participating in a Managed Medicaid Plan:  No   A/P: Diabetes longstanding currently uncontrolled. Patient is able to verbalize appropriate hypoglycemia management plan. Medication adherence appears improved. Control is suboptimal due to previous non-adherence, suboptimal med regimen, and reported dietary indiscretion. Hyperglycemia symptoms have improved since restarting insulin & metformin on 12/31/22. Patient restarted basaglar at 50 units daily. Patient interested in CGM, but unable to use during hyperbaric treatment. Patient ending hyperbaric treatment in 1-2 weeks--readdress CGM use at next visit. Finally, patient wanting to start Trulicity again, but insurance/cost has prevented patient from picking up. Will contact [facility] to assist in med access.  -Increased dose of basal insulin Basaglar(insulin glargine) from 50 to 60 units daily. Counseled to begin administering in the morning instead of at dinner to ensure coverage at dinner time. Counseled to decrease to 55 untis if BG <132 before hyperbaric treatment.   -Continued metformin XR 500 mg tablet, 4  tablets w/ dinner (gets n/v when takes OES, does not eat breakfast often).  -Patient educated on purpose, proper use, and potential adverse effects of insulin.  -Extensively discussed pathophysiology of diabetes, recommended lifestyle interventions, dietary effects on blood sugar control.  -Counseled on s/sx of and management of hypoglycemia.  -Next A1c anticipated 04/02/23.   Written patient instructions provided. Patient verbalized understanding of treatment plan.  Total time in face to face counseling 30 minutes.    Follow-up:  Pharmacist in 1 month  Jarrett Ables, PharmD PGY-1 Pharmacy Resident

## 2023-03-01 NOTE — Transitions of Care (Post Inpatient/ED Visit) (Signed)
   03/01/2023  Name: [name] MRN: [id number] DOB: 09-19-1980  Today's TOC FU Call Status: Today's TOC FU Call Status:: Successful TOC FU Call Completed TOC FU Call Complete Date: 03/01/23 Patient's Name and Date of Birth confirmed.  Transition Care Management Follow-up Telephone Call Date of Discharge: 02/28/23 Discharge Facility: [facility]) Type of Discharge: Inpatient Admission Primary Inpatient Discharge Diagnosis:: osteomyelitis How have you been since you were released from the hospital?: Better Any questions or concerns?: No  Items Reviewed: Did you receive and understand the discharge instructions provided?: Yes Medications obtained,verified, and reconciled?: Partial Review Completed Reason for Partial Mediation Review: He said he has all medications as well as a working glucometer and he did not have any questions about the med regime and did not need to review the list  He had an appt today with our clinical pharmacist but he said he forgot. I offered to re-schedule that appointment but he said he would call later to schedule Any new allergies since your discharge?: No Dietary orders reviewed?: No Do you have support at home?: Yes People in Home: parent(s) Name of Support/Comfort Primary Source: He lives with his parents  Medications Reviewed Today: Medications Reviewed Today   Medications were not reviewed in this encounter     Home Care and Equipment/Supplies: Were Home Health Services Ordered?: No Any new equipment or medical supplies ordered?: No  Functional Questionnaire: Do you need assistance with bathing/showering or dressing?: No Do you need assistance with meal preparation?: No Do you need assistance with eating?: No Do you have difficulty maintaining continence: No Do you need assistance with getting out of bed/getting out of a chair/moving?: Yes (He has a walker and knee scooter to assist with ambulation.  he said  he has dressings that are intact on both feet and they are not to be removed until he sees podiatry) Do you have difficulty managing or taking your medications?: No  Follow up appointments reviewed: PCP Follow-up appointment confirmed?: Yes Date of PCP follow-up appointment?: 04/06/23 Follow-up Provider: [facility] Follow-up appointment confirmed?: Yes Date of Specialist follow-up appointment?: 03/03/23 Follow-Up Specialty Provider:: wound care.  He stated he needs to call to schedule the follow up with podiatry.  He said he has their phone number. Do you need transportation to your follow-up appointment?: No Do you understand care options if your condition(s) worsen?: Yes-patient verbalized understanding    SIGNATURE Robyne Peers, RN

## 2023-03-02 ENCOUNTER — Telehealth: Payer: Self-pay

## 2023-03-02 DIAGNOSIS — L97522 Non-pressure chronic ulcer of other part of left foot with fat layer exposed: Secondary | ICD-10-CM | POA: Diagnosis not present

## 2023-03-02 DIAGNOSIS — Z89412 Acquired absence of left great toe: Secondary | ICD-10-CM | POA: Diagnosis not present

## 2023-03-02 DIAGNOSIS — E11621 Type 2 diabetes mellitus with foot ulcer: Secondary | ICD-10-CM | POA: Diagnosis not present

## 2023-03-02 DIAGNOSIS — I872 Venous insufficiency (chronic) (peripheral): Secondary | ICD-10-CM | POA: Diagnosis not present

## 2023-03-02 DIAGNOSIS — L97512 Non-pressure chronic ulcer of other part of right foot with fat layer exposed: Secondary | ICD-10-CM | POA: Diagnosis not present

## 2023-03-02 LAB — AEROBIC/ANAEROBIC CULTURE W GRAM STAIN (SURGICAL/DEEP WOUND)
Gram Stain: NONE SEEN
Gram Stain: NONE SEEN

## 2023-03-02 LAB — SURGICAL PATHOLOGY

## 2023-03-02 NOTE — Transitions of Care (Post Inpatient/ED Visit) (Signed)
03/02/2023  Name: [name] MRN: [id number] DOB: [birth year]  Today's TOC FU Call Status: Today's TOC FU Call Status:: Successful TOC FU Call Completed TOC FU Call Complete Date: 03/02/23 Patient's Name and Date of Birth confirmed.  Transition Care Management Follow-up Telephone Call Date of Discharge: 02/28/23 Discharge Facility: [facility]) Type of Discharge: Inpatient Admission Primary Inpatient Discharge Diagnosis:: Osteomyelitis ( Left 5th toe Right 1st toe-amputation) How have you been since you were released from the hospital?: Better (Getting around [facility] wears walking shoe and heel elevator) Any questions or concerns?: No  Items Reviewed: Did you receive and understand the discharge instructions provided?: Yes (States he has had this type of procedure before and is familiar with follow-up) Medications obtained,verified, and reconciled?: Yes (Medications Reviewed) Any new allergies since your discharge?: No Dietary orders reviewed?: Yes Type of Diet Ordered:: Reg Texture, NAS CCHO Do you have support at home?: Yes People in Home: parent(s) Name of Support/Comfort Primary Source: Mother resides in home  Medications Reviewed Today: Medications Reviewed Today     Reviewed by Johnnette Barrios, RN (Registered Nurse) on 03/02/23 at 1310  Med List Status: <None>   Medication Order Taking? Sig Documenting Provider Last Dose Status Informant  acetaminophen (TYLENOL) 325 MG tablet [id number] No Take 2 tablets (650 mg total) by mouth every 6 (six) hours as needed for mild pain or moderate pain (or Fever >/= 101).  Patient not taking: Reported on 03/02/2023   Sunnie Nielsen, DO Not Taking Active   acidophilus (RISAQUAD) CAPS capsule [id number] Yes Take 1 capsule by mouth 3 (three) times daily. Sunnie Nielsen, DO Taking Active   amoxicillin-clavulanate (AUGMENTIN) 875-125 MG tablet [id number] Yes Take 1 tablet by mouth 2 (two) times daily for 14  days. Sunnie Nielsen, DO Taking Active   atomoxetine (STRATTERA) 80 MG capsule [id number] Yes Take 1 capsule (80 mg total) by mouth daily. Marcine Matar, MD Taking Active Self  blood glucose meter kit and supplies [id number] Yes Dispense based on patient and insurance preference. Use up to four times daily as directed. (FOR ICD-10 E10.9, E11.9). Bobbye Morton, MD Taking Active Self  insulin aspart (NOVOLOG) 100 UNIT/ML FlexPen [id number] Yes Inject 5 Units into the skin 3 (three) times daily with meals. Sunnie Nielsen, DO Taking Active   Insulin Glargine [facility]) 100 UNIT/ML [id number] Yes Inject 60 Units into the skin at bedtime. Marcine Matar, MD Taking Active Self  Insulin Pen Needle (PEN NEEDLES) 31G X 8 MM MISC [id number] Yes UAD Marcine Matar, MD Taking Active Self  metFORMIN (GLUCOPHAGE-XR) 500 MG 24 hr tablet [id number] Yes Take 4 tablets (2,000 mg total) by mouth daily with breakfast. Marcine Matar, MD Taking Active Self  oxyCODONE (OXY IR/ROXICODONE) 5 MG immediate release tablet [id number] Yes Take 1 tablet (5 mg total) by mouth every 4 (four) hours as needed for moderate pain or severe pain. Sunnie Nielsen, DO Taking Active            Med Note Sharon Seller, Natausha Jungwirth L   Tue Mar 02, 2023  1:09 PM) Has not needed   pravastatin (PRAVACHOL) 40 MG tablet [id number] Yes Take 1 tablet (40 mg total) by mouth once daily. Marcine Matar, MD Taking Active Self  senna-docusate (SENOKOT-S) 8.6-50 MG tablet [id number] No Take 1 tablet by mouth at bedtime as needed for mild constipation.  Patient not taking: Reported on 03/02/2023   Sunnie Nielsen, DO Not Taking Active  Med Note (Artemisa Sladek L   Tue Mar 02, 2023  1:09 PM) Has not neded  sertraline (ZOLOFT) 50 MG tablet [id number] Yes Take 1 tablet (50 mg total) by mouth daily. Marcine Matar, MD Taking Active Self            Home Care and Equipment/Supplies: Were Home Health Services Ordered?:  No Any new equipment or medical supplies ordered?: No  Functional Questionnaire: Do you need assistance with bathing/showering or dressing?: No Do you need assistance with meal preparation?: No Do you need assistance with eating?: No Do you have difficulty maintaining continence: No Do you need assistance with getting out of bed/getting out of a chair/moving?: No Do you have difficulty managing or taking your medications?: No (Keeps medications in original containers)  Follow up appointments reviewed: PCP Follow-up appointment confirmed?: No MD Provider Line Number:[phone] Given: No (He has not schedud a post hospital appt, offered and he is able to schedule and will call. Previously scheduled appt is 11/12) Date of Specialist follow-up appointment?: 03/08/23 Follow-Up Specialty Provider:: Post op surgical follow-up scheduled for 03/08/23 at 3PM He has a previously ongoing  scheduled appt with Wound Center for 03/03/23 which he is rescheduling due torecent  surgery Do you need transportation to your follow-up appointment?: No Do you understand care options if your condition(s) worsen?: Yes-patient verbalized understanding  SDOH Interventions Today    Flowsheet Row Most Recent Value  SDOH Interventions   Physical Activity Interventions Patient Declined  [decreased activity due ro wounds Bilateral feet and recent toe amputations ( Left and Right foot)]  Health Literacy Interventions Intervention Not Indicated       Goals Addressed             This Visit's Progress    TOC Care Plan       Current Barriers:  Knowledge Deficits related to plan of care for management of recent amputation of 2 toes, wound care and preventative Diabetic foot care   Chronic Disease Management support and education needs related to DM   RNCM Clinical Goal(s):  Patient will work with the Care Management team over the next 30 days to address Transition of Care Barriers: Medication Management Provider  appointments take all medications exactly as prescribed and will call provider for medication related questions as evidenced by no missed medications , medications are  refilled timely and he has no adverse side effects  attend all scheduled medical appointments: with PCP, Surgeon/ Podiatry and Wound Care Center  as evidenced by no missed appointments and follow-up appt recommendations ae scheduled   through collaboration with RN Care manager, provider, and care team.   Interventions: Evaluation of current treatment plan related to  self management and patient's adherence to plan as established by provider  Transitions of Care:  New goal. Doctor Visits  - discussed the importance of doctor visits Communication with CM Case Management and Wound Care Center  re: on-going wound care 03/09/23 @ 1:00pm s/s infections and preventative Diabetic foot care   Patient Goals/Self-Care Activities: Participate in Transition of Care Program/Attend TOC scheduled calls Take all medications as prescribed Attend all scheduled provider appointments Call pharmacy for medication refills 3-7 days in advance of running out of medications Perform all self care activities independently  Perform IADL's (shopping, preparing meals, housekeeping, managing finances) independently Call provider office for new concerns or questions   Follow Up Plan:  Telephone follow up appointment with care management team member scheduled for:  03/09/23 1:00pm  The  patient has been provided with contact information for the care management team and has been advised to call with any health related questions or concerns.  Next PCP appointment scheduled for:        Susa Loffler , BSN, RN Care Management Coordinator [facility]   [facility] [email or web address] Direct Dial: [phone]

## 2023-03-03 ENCOUNTER — Ambulatory Visit: Payer: 59 | Admitting: Internal Medicine

## 2023-03-08 ENCOUNTER — Other Ambulatory Visit: Payer: Self-pay

## 2023-03-08 DIAGNOSIS — M86672 Other chronic osteomyelitis, left ankle and foot: Secondary | ICD-10-CM | POA: Diagnosis not present

## 2023-03-08 DIAGNOSIS — E1142 Type 2 diabetes mellitus with diabetic polyneuropathy: Secondary | ICD-10-CM | POA: Diagnosis not present

## 2023-03-08 DIAGNOSIS — L97522 Non-pressure chronic ulcer of other part of left foot with fat layer exposed: Secondary | ICD-10-CM | POA: Diagnosis not present

## 2023-03-08 MED ORDER — MUPIROCIN 2 % EX OINT
1.0000 | TOPICAL_OINTMENT | Freq: Every day | CUTANEOUS | 1 refills | Status: DC
  Filled 2023-03-08: qty 22, 7d supply, fill #0
  Filled 2023-03-08 – 2023-03-29 (×2): qty 22, 22d supply, fill #0
  Filled 2023-03-29: qty 22, 22d supply, fill #1

## 2023-03-09 ENCOUNTER — Other Ambulatory Visit: Payer: 59

## 2023-03-09 ENCOUNTER — Telehealth: Payer: Self-pay

## 2023-03-09 ENCOUNTER — Other Ambulatory Visit: Payer: Self-pay

## 2023-03-09 ENCOUNTER — Telehealth: Payer: Self-pay | Admitting: *Deleted

## 2023-03-09 NOTE — Patient Outreach (Signed)
Care Management  Transitions of Care Program Transitions of Care Post-discharge week 2   03/09/2023 Name: [name] MRN: [id number] DOB: [birth year]  Subjective: [name] is a 42 y.o. year old male who is a primary care patient of Marcine Matar, MD. The Care Management team Engaged with patient Engaged with patient by telephone to assess and address transitions of care needs.   Consent to Services:  Patient was given information about care management services, agreed to services, and gave verbal consent to participate.   Assessment:   Patient voices no new complaints Patient has not developed/ reported any new Medical issues / Dx or acute changes.- since last follow-up call . He was seen by Surgeon 10/14 Added Bactroban to amputation site every day x 7 days as preventative measure  Hospital stay    10/2-10/6 / 2024  Patient educated on red flags s/s to watch for and was encouraged to report any of these identified , any new symptoms , changes in baseline or  medication regimen,  change in health status  /  well-being, or safety concerns to PCP and / or the  [facility] Case Management team . Reviewed goals for care Patient provided with Contact information and verbalized understanding with current POC.        SDOH Interventions    Flowsheet Row Telephone from 03/09/2023 in [place] POPULATION HEALTH DEPARTMENT Telephone from 03/02/2023 in [facility] HEALTH POPULATION HEALTH DEPARTMENT Office Visit from 12/31/2022 in [facility] Office Visit from 01/30/2021 in [facility]  SDOH Interventions      Food Insecurity Interventions Other (Comment)  [Referral to SW, Food Pantries] -- -- --  Depression Interventions/Treatment  -- -- Medication, Referral to Psychiatry Currently on Treatment  Physical Activity Interventions -- Patient Declined  [decreased activity due ro wounds Bilateral feet and recent toe amputations (  Left and Right foot)] -- --  Health Literacy Interventions -- Intervention Not Indicated -- --        Goals Addressed             This Visit's Progress    TOC Care Plan       Current Barriers:  Knowledge Deficits related to plan of care for management of recent amputation of 2 toes, wound care and preventative Diabetic foot care   Chronic Disease Management support and education needs related to DM   RNCM Clinical Goal(s):  Patient will work with the Care Management team over the next 30 days to address Transition of Care Barriers: Medication Management Provider appointments take all medications exactly as prescribed and will call provider for medication related questions as evidenced by no missed medications , medications are  refilled timely and he has no adverse side effects  attend all scheduled medical appointments: with PCP, Surgeon/ Podiatry and Wound Care Center  as evidenced by no missed appointments and follow-up appt recommendations are scheduled   through collaboration with RN Care manager, provider, and care team.   Interventions: Evaluation of current treatment plan related to  self management and patient's adherence to plan as established by provider  Transitions of Care:  Goal on track:  Yes. Doctor Visits  - discussed the importance of doctor visits Referral to SW for assistance securing food resources   Patient Goals/Self-Care Activities: Participate in Transition of Care Program/Attend [place] scheduled calls Take all medications as prescribed Attend all scheduled provider appointments Call pharmacy for medication refills 3-7 days  in advance of running out of medications Perform all self care activities independently  Perform IADL's (shopping, preparing meals, housekeeping, managing finances) independently Call provider office for new concerns or questions   Follow Up Plan:  Telephone follow up appointment with care management team member scheduled for:   03/17/23 10:00 am  The patient has been provided with contact information for the care management team and has been advised to call with any health related questions or concerns.  Surgical follow-up 03/15/23- routine follow-up wound center is on hold until released from surgeon           Plan: The patient has been provided with contact information for the care management team and has been advised to call with any health related questions or concerns.   Susa Loffler , BSN, RN Care Management Coordinator [place]   [facility] [email or web address] Direct Dial: [phone]

## 2023-03-09 NOTE — Progress Notes (Signed)
  Care Coordination   Note   03/09/2023 Name: ARLON BLEIER MRN: [id number] DOB: 1980/06/08  [name] is a 42 y.o. year old male who sees Marcine Matar, MD for primary care. I reached out to [name] by phone today to offer care coordination services.  Mr. Moring was given information about Care Coordination services today including:   The Care Coordination services include support from the care team which includes your Nurse Coordinator, Clinical Social Worker, or Pharmacist.  The Care Coordination team is here to help remove barriers to the health concerns and goals most important to you. Care Coordination services are voluntary, and the patient may decline or stop services at any time by request to their care team member.   Care Coordination Consent Status: Patient agreed to services and verbal consent obtained.   Follow up plan:  Telephone appointment with care coordination team member scheduled for:  10/18  Encounter Outcome:  Patient Scheduled  [facility] Coordination Care Guide  Direct Dial: [phone]

## 2023-03-10 ENCOUNTER — Other Ambulatory Visit: Payer: Self-pay

## 2023-03-12 ENCOUNTER — Ambulatory Visit: Payer: Self-pay

## 2023-03-12 NOTE — Patient Instructions (Signed)
Visit Information  Thank you for taking time to visit with me today. Please don't hesitate to contact me if I can be of assistance to you.   Following are the goals we discussed today:   Goals Addressed             This Visit's Progress    COMPLETED: Care Coordination Activities       Care Coordination Interventions: The patient lives with his parents and would like to become more independent with food options and preparing healthier meal options The patient would like to apply for food stamps and has chosen to apply online versus in person SW provided the patient with the link to [place] ePass. The patient will access and complete an application SW provided the patient with education on [employer] app as well as a list of food pantry options Discussed the patient will access provided resources. The patient will contact SW as needed         If you are experiencing a Mental Health or Behavioral Health Crisis or need someone to talk to, please call 1-800-273-TALK (toll free, 24 hour hotline) go to [facility] [address], [place] [phone]) call 911  Patient verbalizes understanding of instructions and care plan provided today and agrees to view in MyChart. Active MyChart status and patient understanding of how to access instructions and care plan via MyChart confirmed with patient.     No further follow up required: Please contact your primary care provider as needed.  Bevelyn Ngo, BSW, CDP [facility]  [facility], [facility] Social Worker Direct Dial: [phone]  Fax: [phone]

## 2023-03-12 NOTE — Patient Outreach (Signed)
  Care Coordination   Initial Visit Note   03/12/2023 Name: [name] MRN: [id number] DOB: 1981/04/21  [name] is a 42 y.o. year old male who sees Marcine Matar, MD for primary care. I spoke with  [name] by phone today.  What matters to the patients health and wellness today?  Identify resources to assist with healthy meal options.    Goals Addressed             This Visit's Progress    COMPLETED: Care Coordination Activities       Care Coordination Interventions: The patient lives with his parents and would like to become more independent with food options and preparing healthier meal options The patient would like to apply for food stamps and has chosen to apply online versus in person SW provided the patient with the link to [place] ePass. The patient will access and complete an application SW provided the patient with education on [employer] app as well as a list of food pantry options Discussed the patient will access provided resources. The patient will contact SW as needed         SDOH assessments and interventions completed:  Yes  SDOH Interventions Today    Flowsheet Row Most Recent Value  SDOH Interventions   Food Insecurity Interventions Other (Comment)  [Provided a list of food pantry location, [employer], [employer] application]  Housing Interventions Intervention Not Indicated  Transportation Interventions Intervention Not Indicated        Care Coordination Interventions:  Yes, provided   Interventions Today    Flowsheet Row Most Recent Value  Chronic Disease   Chronic disease during today's visit Diabetes  General Interventions   General Interventions Discussed/Reviewed General Interventions Discussed, [employer]  [provided the patient with information on food resources]  Nutrition Interventions   Nutrition Discussed/Reviewed Nutrition Discussed  [Patient would like to eat  better and needs assistance with resources to obtain healthier food options]        Follow up plan: No further intervention required.   Encounter Outcome:  Patient Visit Completed   Bevelyn Ngo, BSW, CDP [facility]  [facility], [facility] Social Worker Direct Dial: [phone]  Fax: [phone]

## 2023-03-17 ENCOUNTER — Other Ambulatory Visit: Payer: Self-pay

## 2023-03-17 ENCOUNTER — Ambulatory Visit: Payer: 59 | Admitting: Internal Medicine

## 2023-03-17 ENCOUNTER — Telehealth: Payer: Self-pay

## 2023-03-17 NOTE — Patient Outreach (Signed)
Care Management  Transitions of Care Program Transitions of Care Post-discharge week 2   03/17/2023 Name: BURLEIGH PLASS MRN: [id number] DOB: [birth year]  Subjective: [name] is a 42 y.o. year old male who is a primary care patient of Marcine Matar, MD. The Care Management team Engaged with patient Engaged with patient by telephone to assess and address transitions of care needs.   Consent to Services:  Patient was given information about care management services, agreed to services, and gave verbal consent to participate.   Assessment:   Patient voices no new complaints Patient has not developed/ reported any new Medical issues / Dx or acute changes.- since last follow-up call for most recent  Hospital stay  10/2-10/6/ 2024  He is doing well Seen by Podiatry/ surgical follow-up. He has NNO, his surgical site is healing well. He continues w/ bactroban and DD His wound shows no s/s of infection. He was provided name of Food pantries local to him and has SW referral for SDOH needs. He stated he is feeling better , getting back into usual routine, had no questions or concerns at this time  Patient educated on red flags s/s to watch for and was encouraged to report any of these identified , any new symptoms , changes in baseline or  medication regimen,  change in health status  /  well-being, or safety concerns to PCP and / or the  [facility] Case Management team .          SDOH Interventions    Flowsheet Row Care Coordination from 03/12/2023 in [facility] Care Coordination Telephone from 03/09/2023 in [place] POPULATION HEALTH DEPARTMENT Telephone from 03/02/2023 in [place] [facility]ALTH POPULATION HEALTH DEPARTMENT Office Visit from 12/31/2022 in [facility] Office Visit from 01/30/2021 in [facility]  SDOH Interventions       Food Insecurity Interventions Other (Comment)  [Provided a list of  food pantry location, [employer], [employer]NS application] Other (Comment)  [Referral to SW, Food Pantries] -- -- --  Housing Interventions Intervention Not Indicated -- -- -- --  Transportation Interventions Intervention Not Indicated -- -- -- --  Depression Interventions/Treatment  -- -- -- Medication, Referral to Psychiatry Currently on Treatment  Physical Activity Interventions -- -- Patient Declined  [decreased activity due ro wounds Bilateral feet and recent toe amputations ( Left and Right foot)] -- --  Health Literacy Interventions -- -- Intervention Not Indicated -- --        Goals Addressed             This Visit's Progress    TOC Care Plan       Current Barriers:  Knowledge Deficits related to plan of care for management of recent amputation of 2 toes, wound care and preventative Diabetic foot care   Chronic Disease Management support and education needs related to DM   RNCM Clinical Goal(s):  Patient will work with the Care Management team over the next 30 days to address Transition of Care Barriers: Medication Management Provider appointments take all medications exactly as prescribed and will call provider for medication related questions as evidenced by no missed medications , medications are  refilled timely and he has no adverse side effects  attend all scheduled medical appointments: with PCP, Surgeon/ Podiatry and Wound Care Center  as evidenced by no missed appointments and follow-up appt recommendations are scheduled   through collaboration with RN  Care manager, provider, and care team.   Interventions: Evaluation of current treatment plan related to  self management and patient's adherence to plan as established by provider  Transitions of Care:  Goal on track:  Yes. Doctor Visits  - discussed the importance of doctor visits Referral to SW for assistance securing food resources[facility]    Patient Goals/Self-Care  Activities: Participate in Transition of Care Program/Attend [facility] scheduled calls Take all medications as prescribed Attend all scheduled provider appointments Call pharmacy for medication refills 3-7 days in advance of running out of medications Perform all self care activities independently  Perform IADL's (shopping, preparing meals, housekeeping, managing finances) independently Call provider office for new concerns or questions   Follow Up Plan:  Telephone follow up appointment with care management team member scheduled for:  03/25/23 10:00 am  The patient has been provided with contact information for the care management team and has been advised to call with any health related questions or concerns.  Surgical follow-up 03/15/23 completed routine follow-up wound center visits resuming ,still following up with  surgeon         Routine follow-up and on-going assessment evaluation and education of disease processes, recommended interventions for both chronic and acute medical conditions , will occur during each  visit along with ongoing  management of symptoms ,medication reviews and reconciliation. Will make changes and updates episodically to Care Plan and initiate visits / follow-up as indicated .   Patient provided with Contact information for [facility] CM   Reviewed goals for care , patient   verbalized understanding and agreement with current POC.   Plan: Telephone follow up appointment with care management team member scheduled for: The patient has been provided with contact information for the care management team and has been advised to call with any health related questions or concerns.   Susa Loffler , BSN, RN Care Management Coordinator [place]   [name] [email or web address] Direct Dial: [phone]

## 2023-03-22 NOTE — Progress Notes (Signed)
[name], [name] ([id number]) [id number] Page 1 of 13 Visit Report for 02/24/2023 Arrival Information Details Patient Name: Date of Service: [name], [name] 02/24/2023 11:15 A M Medical Record Number: [id number] Patient Account Number: [id number] Date of Birth/Sex: Treating RN: [birth year] (42 y.o. [name]) [name] Primary Care Claribel Sachs: [name] Other Clinician: Referring Dariella Gillihan: Treating Chrystine Frogge/Extender: Greta Doom in Treatment: 21 Visit Information History Since Last Visit Added or deleted any medications: No Patient Arrived: Ambulatory Any new allergies or adverse reactions: No Arrival Time: 10:53 Had a fall or experienced change in No Accompanied By: self activities of daily living that may affect Transfer Assistance: None risk of falls: Patient Identification Verified: Yes Signs or symptoms of abuse/neglect since last visito No Secondary Verification Process Completed: Yes Hospitalized since last visit: No Patient Requires Transmission-Based Precautions: No Implantable device outside of the clinic excluding No Patient Has Alerts: Yes cellular tissue based products placed in the center Patient Alerts: Left ABI 1.03 05/13/22 since last visit: Right ABI .78 05/13/22 Has Dressing in Place as Prescribed: Yes Pain Present Now: Yes Electronic Signature(s) Signed: 03/22/2023 8:01:26 AM By: [name] RN Entered By: [name] on 02/24/2023 07:54:24 -------------------------------------------------------------------------------- Clinic Level of Care Assessment Details Patient Name: Date of Service: BENNET, SPECTOR 02/24/2023 11:15 A M Medical Record Number: [id number] Patient Account Number: [id number] Date of Birth/Sex: Treating RN: Jun 26, 1980 (42 y.o. [name] Primary Care Karolyne Timmons: [name] Other Clinician: Referring Floye Fesler: Treating Narada Uzzle/Extender: Greta Doom in Treatment: 21 Clinic Level of Care Assessment Items TOOL 4 Quantity Score X- 1 0 Use when only an EandM is performed on FOLLOW-UP visit ASSESSMENTS - Nursing Assessment / Reassessment X- 1 10 Reassessment of Co-morbidities (includes updates in patient status) X- 1 5 Reassessment of Adherence to Treatment Plan GIONNI, VILLAR ([id number]) [phone].pdf Page 2 of 13 ASSESSMENTS - Wound and Skin A ssessment / Reassessment []  - Simple Wound Assessment / Reassessment - one wound 0 X- 4 5 Complex Wound Assessment / Reassessment - multiple wounds []  - 0 Dermatologic / Skin Assessment (not related to wound area) ASSESSMENTS - Focused Assessment []  - 0 Circumferential Edema Measurements - multi extremities []  - 0 Nutritional Assessment / Counseling / Intervention []  - 0 Lower Extremity Assessment (monofilament, tuning fork, pulses) []  - 0 Peripheral Arterial Disease Assessment (using hand held doppler) ASSESSMENTS - Ostomy and/or Continence Assessment and Care []  - 0 Incontinence Assessment and Management []  - 0 Ostomy Care Assessment and Management (repouching, etc.) PROCESS - Coordination of Care X - Simple Patient / Family Education for ongoing care 1 15 []  - 0 Complex (extensive) Patient / Family Education for ongoing care []  - 0 Staff obtains Chiropractor, Records, T Results / Process Orders est []  - 0 Staff telephones HHA, Nursing Homes / Clarify orders / etc []  - 0 Routine Transfer to another Facility (non-emergent condition) []  - 0 Routine Hospital Admission (non-emergent condition) []  - 0 New Admissions / Manufacturing engineer / Ordering NPWT Apligraf, etc. , []  - 0 Emergency Hospital Admission (emergent condition) X- 1 10 Simple Discharge Coordination []  - 0 Complex (extensive) Discharge Coordination PROCESS - Special Needs []  - 0 Pediatric / Minor Patient Management []  - 0 Isolation Patient Management []  -  0 Hearing / Language / Visual special needs []  - 0 Assessment of Community assistance (transportation, D/C planning, etc.) []  - 0 Additional assistance / Altered mentation []  - 0 Support Surface(s) Assessment (bed, cushion, seat, etc.) INTERVENTIONS - Wound  Cleansing / Measurement []  - 0 Simple Wound Cleansing - one wound X- 4 5 Complex Wound Cleansing - multiple wounds X- 1 5 Wound Imaging (photographs - any number of wounds) []  - 0 Wound Tracing (instead of photographs) []  - 0 Simple Wound Measurement - one wound X- 4 5 Complex Wound Measurement - multiple wounds INTERVENTIONS - Wound Dressings X - Small Wound Dressing one or multiple wounds 4 10 []  - 0 Medium Wound Dressing one or multiple wounds []  - 0 Large Wound Dressing one or multiple wounds []  - 0 Application of Medications - topical []  - 0 Application of Medications - injection INTERVENTIONS - Miscellaneous []  - 0 External ear exam ANICETO, DENNING ([id number]) [phone].pdf Page 3 of 13 []  - 0 Specimen Collection (cultures, biopsies, blood, body fluids, etc.) []  - 0 Specimen(s) / Culture(s) sent or taken to Lab for analysis []  - 0 Patient Transfer (multiple staff / Michiel Sites Lift / Similar devices) []  - 0 Simple Staple / Suture removal (25 or less) []  - 0 Complex Staple / Suture removal (26 or more) []  - 0 Hypo / Hyperglycemic Management (close monitor of Blood Glucose) []  - 0 Ankle / Brachial Index (ABI) - do not check if billed separately X- 1 5 Vital Signs Has the patient been seen at the hospital within the last three years: Yes Total Score: 150 Level Of Care: New/Established - Level 4 Electronic Signature(s) Signed: 03/22/2023 8:01:26 AM By: [name] RN Entered By: [name] on 02/24/2023 08:50:43 -------------------------------------------------------------------------------- Encounter Discharge Information Details Patient Name: Date of Service: [name] 02/24/2023 11:15 A M Medical Record Number: [id number] Patient Account Number: [id number] Date of Birth/Sex: Treating RN: March 09, 1981 (42 y.o. [name] Primary Care Laurelyn Terrero: [name] Other Clinician: Referring Lastacia Solum: Treating Savaughn Karwowski/Extender: Greta Doom in Treatment: 21 Encounter Discharge Information Items Discharge Condition: Stable Ambulatory Status: Ambulatory Discharge Destination: Other (Note Required) Transportation: Private Auto Accompanied By: self Schedule Follow-up Appointment: Yes Clinical Summary of Care: Notes PATIENT SENT TO EMERGENCY FOR EVALUATION OF INFECTION IN RIGHT FOOT , POSSBILE NEEED FOR IV ANTIBIOTICS AND MRI Electronic Signature(s) Signed: 02/24/2023 11:52:20 AM By: [name] RN Entered By: [name] on 02/24/2023 08:52:20 [name] ([id number]) [id number] Page 4 of 13 -------------------------------------------------------------------------------- Lower Extremity Assessment Details Patient Name: Date of Service: ZAYDYN, HARLIN 02/24/2023 11:15 A M Medical Record Number: [id number] Patient Account Number: [id number] Date of Birth/Sex: Treating RN: Sep 28, 1980 (41 y.o. [name]) [name] Primary Care Wilson Dusenbery: [name] Other Clinician: Referring Kasheem Toner: Treating Whitney Bingaman/Extender: Greta Doom in Treatment: 21 Electronic Signature(s) Signed: 03/22/2023 8:01:26 AM By: [name] RN Entered By: [name] on 02/24/2023 08:12:06 -------------------------------------------------------------------------------- Multi Wound Chart Details Patient Name: Date of Service: [name] 02/24/2023 11:15 A M Medical Record Number: [id number] Patient Account Number: [id number] Date of Birth/Sex: Treating RN: 08-23-1980 (41 y.o. 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 Left Metatarsal head fourth Wound Location: Shear/Friction Pressure Injury Gradually Appeared Wounding Event: Diabetic Wound/Ulcer of the Lower Diabetic Wound/Ulcer of the Lower Diabetic Wound/Ulcer of the Lower Primary Etiology: Extremity Extremity Extremity Hypertension, Peripheral Venous Hypertension, Peripheral Venous Hypertension, Peripheral Venous Comorbid History: Disease, Type II Diabetes, Disease, Type II  Diabetes, Disease, Type II Diabetes, Osteomyelitis Osteomyelitis Osteomyelitis 11/18/2022 12/27/2022 02/21/2023 Date Acquired: 14 4 0 Weeks of Treatment: Open Open Open Wound Status: No No No Wound Recurrence: 2x1x0.5 5x3x2.5 1x1x0.1 Measurements L x W x D (cm) 1.571 11.781 0.785 A (cm) : rea 0.785 29.452 0.079 Volume (cm) : -400.30% -14812.70% N/A % Reduction in A rea: -735.10% -94906.50% N/A % Reduction in Volume: Grade 1 Grade 2 Grade 1 Classification: Medium Medium Medium Exudate A mount: Serosanguineous Purulent Serosanguineous Exudate Type: red, brown yellow, brown, green red, brown Exudate Color: Large (67-100%) Small (1-33%) Medium (34-66%) Granulation A mount: Red, Pink Pink Pink Granulation QualitySALAHUDDIN, SALDUTTI ([id number]) [id number].pdf Page 5 of 13 Small (1-33%) Large (67-100%) Medium (34-66%) Necrotic Amount: Fat Layer (Subcutaneous Tissue): Yes Fat Layer (Subcutaneous Tissue): Yes Fat Layer (Subcutaneous Tissue): Yes Exposed Structures: Fascia: No Fascia: No Fascia: No Tendon: No Tendon: No Tendon: No Muscle: No Muscle: No Muscle: No Joint: No Joint: No Joint: No Bone:  No Bone: No Bone: No None None None Epithelialization: Wound Number: 9 N/A N/A Photos: N/A N/A Left, Lateral Metatarsal head fifth N/A N/A Wound Location: Blister N/A N/A Wounding Event: Diabetic Wound/Ulcer of the Lower N/A N/A Primary Etiology: Extremity Hypertension, Peripheral Venous N/A N/A Comorbid History: Disease, Type II Diabetes, Osteomyelitis 10/07/2022 N/A N/A Date Acquired: 53 N/A N/A Weeks of Treatment: Open N/A N/A Wound Status: No N/A N/A Wound Recurrence: 0.6x0.2x0.8 N/A N/A Measurements L x W x D (cm) 0.094 N/A N/A A (cm) : rea 0.075 N/A N/A Volume (cm) : 97.80% N/A N/A % Reduction in A rea: 91.30% N/A N/A % Reduction in Volume: Grade 3 N/A N/A Classification: Medium N/A N/A Exudate A mount: Serosanguineous N/A N/A Exudate Type: red, brown N/A N/A Exudate Color: Small (1-33%) N/A N/A Granulation A mount: Pink N/A N/A Granulation Quality: Large (67-100%) N/A N/A Necrotic A mount: Fat Layer (Subcutaneous Tissue): Yes N/A N/A Exposed Structures: Fascia: No Tendon: No Muscle: No Joint: No Bone: No None N/A N/A Epithelialization: Treatment Notes Electronic Signature(s) Signed: 03/22/2023 8:01:26 AM By: [name] RN Entered By: [name] on 02/24/2023 08:12:12 -------------------------------------------------------------------------------- Multi-Disciplinary Care Plan Details Patient Name: Date of Service: [name] 02/24/2023 11:15 A M Medical Record Number: [id number] Patient Account Number: [id number] Date of Birth/Sex: Treating RN: 21-Apr-1981 (41 y.o. [name] Primary Care Isaiah Cianci: [name] Other Clinician: Referring Yafet Cline: Treating Yonah Tangeman/Extender: Greta Doom in Treatment: [address] [place] ([id number]) [id number] Page 6 of 13 Active Inactive Wound/Skin Impairment Nursing Diagnoses: Knowledge deficit related to  ulceration/compromised skin integrity Goals: Patient/caregiver will verbalize understanding of skin care regimen Date Initiated: 09/30/2022 Date Inactivated: 12/02/2022 Target Resolution Date: 10/31/2022 Goal Status: Met Ulcer/skin breakdown will have a volume reduction of 30% by week 4 Date Initiated: 09/30/2022 Date Inactivated: 12/02/2022 Target Resolution Date: 10/31/2022 Goal Status: Unmet Unmet Reason: continue care Ulcer/skin breakdown will have a volume reduction of 50% by week 8 Date Initiated: 09/30/2022 Date Inactivated: 12/09/2022 Target Resolution Date: 11/30/2022 Goal Status: Met Ulcer/skin breakdown will have a volume reduction of 80% by week 12 Date Initiated: 09/30/2022 Date Inactivated: 01/13/2023 Target Resolution Date: 12/31/2022 Goal Status: Unmet Unmet Reason: comorbidities Ulcer/skin breakdown will heal within 14 weeks Date Initiated: 09/30/2022 Target Resolution Date: 03/02/2023 Goal Status: Active Interventions: Assess patient/caregiver ability to obtain necessary supplies Assess patient/caregiver ability to perform ulcer/skin care regimen upon admission and as needed Assess ulceration(s) every visit Notes: Electronic Signature(s) Signed: 03/22/2023 8:01:26 AM By: [name] RN Entered  ByYevonne Pax on 02/24/2023 08:12:20 -------------------------------------------------------------------------------- Pain Assessment Details Patient Name: Date of Service: SHUAIB, ROSAUER 02/24/2023 11:15 A M Medical Record Number: [id number] Patient Account Number: [id number] Date of Birth/Sex: Treating RN: 1980/10/18 (41 y.o. [name] Primary Care Tailyn Hantz: [name] Other Clinician: Referring Paylin Hailu: Treating Khaliel Morey/Extender: Greta Doom in Treatment: 21 Active Problems Location of Pain Severity and Description of Pain Patient Has Paino Yes Site Locations Duration of the Pain. SANTI, SUMINSKI ([id number])  [id number] Page 7 of 13 Duration of the Pain. Constant / Intermittento Intermittent How Long Does it Lasto Hours: 1 Minutes: Rate the pain. Current Pain Level: 0 Worst Pain Level: 6 Least Pain Level: 0 Tolerable Pain Level: 5 Character of Pain Describe the Pain: Burning Pain Management and Medication Current Pain Management: Medication: Yes Cold Application: No Rest: Yes Massage: No Activity: No T.E.N.S.: No Heat Application: No Leg drop or elevation: No Is the Current Pain Management Adequate: Inadequate How does your wound impact your activities of daily livingo Sleep: Yes Bathing: No Appetite: No Relationship With Others: No Bladder Continence: No Emotions: No Bowel Continence: No Work: No Toileting: No Drive: No Dressing: No Hobbies: No Electronic Signature(s) Signed: 03/22/2023 8:01:26 AM By: [name] RN Entered By: [name] on 02/24/2023 08:06:46 -------------------------------------------------------------------------------- Patient/Caregiver Education Details Patient Name: Date of Service: [name] 10/2/2024andnbsp11:15 A M Medical Record Number: [id number] Patient Account Number: [id number] Date of Birth/Gender: Treating RN: May 08, 1981 (41 y.o. [name] Primary Care Physician: [name] Other Clinician: Referring Physician: Treating Physician/Extender: Greta Doom in Treatment: 21 Education Assessment Education Provided To: Patient Education Topics Provided Infection: Handouts: Other: s/s Methods: Explain/Verbal ZO, DINOVO ([id number]) [id number] Page 8 of 13 Responses: State content correctly Electronic Signature(s) Signed: 03/22/2023 8:01:26 AM By: [name] RN Entered By: [name] on 02/24/2023 08:12:45 -------------------------------------------------------------------------------- Wound Assessment Details Patient  Name: Date of Service: DONAT, GAMLIN 02/24/2023 11:15 A M Medical Record Number: [id number] Patient Account Number: [id number] Date of Birth/Sex: Treating RN: 06-Aug-1980 (41 y.o. [name]) [name] Primary Care Cindel Daugherty: [name] Other Clinician: Referring Valynn Schamberger: Treating Fairy Ashlock/Extender: Greta Doom in Treatment: 21 Wound Status Wound Number: 10 Primary Diabetic Wound/Ulcer of the Lower Extremity Etiology: Wound Location: Left Amputation Site - Toe Wound Status: Open Wounding Event: Shear/Friction Comorbid Hypertension, Peripheral Venous Disease, Type II Diabetes, Date Acquired: 11/18/2022 History: Osteomyelitis Weeks Of Treatment: 14 Clustered Wound: No Photos Wound Measurements Length: (cm) 2 Width: (cm) 1 Depth: (cm) 0.5 Area: (cm) 1.571 Volume: (cm) 0.785 % Reduction in Area: -400.3% % Reduction in Volume: -735.1% Epithelialization: None Tunneling: No Undermining: No Wound Description Classification: Grade 1 Exudate Amount: Medium Exudate Type: Serosanguineous Exudate Color: red, brown Foul Odor After Cleansing: No Slough/Fibrino Yes Wound Bed Granulation Amount: Large (67-100%) Exposed Structure Granulation Quality: Red, Pink Fascia Exposed: No Necrotic Amount: Small (1-33%) Fat Layer (Subcutaneous Tissue) Exposed: Yes Necrotic Quality: Adherent Slough Tendon Exposed: No Muscle Exposed: No Joint Exposed: No Bone Exposed: No BRET, KLOCKO C ([id number]) [phone].pdf Page 9 of 13 Treatment Notes Wound #10 (Amputation Site - Toe) Wound Laterality: Left Cleanser Soap and Water Discharge Instruction: Gently cleanse wound with antibacterial soap, rinse and pat dry prior to dressing wounds Peri-Wound Care Topical Primary Dressing Silvercel Small 2x2 (in/in) Discharge Instruction: Apply Silvercel Small 2x2 (in/in) as instructed Secondary Dressing Gauze Discharge Instruction: As directed:  dry, moistened with saline or moistened with Dakins Solution Kerlix 4.5 x 4.1 (in/yd) Discharge Instruction:  Apply Kerlix 4.5 x 4.1 (in/yd) as instructed Secured With Medipore T - 35M Medipore H Soft Cloth Surgical T ape ape, 2x2 (in/yd) Compression Wrap Compression Stockings Add-Ons Electronic Signature(s) Signed: 03/22/2023 8:01:26 AM By: [name] RN Entered By: [name] on 02/24/2023 08:08:47 -------------------------------------------------------------------------------- Wound Assessment Details Patient Name: Date of Service: [name] 02/24/2023 11:15 A M Medical Record Number: [id number] Patient Account Number: [id number] Date of Birth/Sex: Treating RN: 02/19/1981 (41 y.o. [name] Primary Care Samyuktha Brau: [name] Other Clinician: Referring Faatima Tench: Treating Deretha Ertle/Extender: Greta Doom in Treatment: 21 Wound Status Wound Number: 11 Primary Diabetic Wound/Ulcer of the Lower Extremity Etiology: Wound Location: Right Amputation Site - Toe Wound Status: Open Wounding Event: Pressure Injury Comorbid Hypertension, Peripheral Venous Disease, Type II Diabetes, Date Acquired: 12/27/2022 History: Osteomyelitis Weeks Of Treatment: 4 Clustered Wound: No Photos KAYLYN, TAFUR ([id number]) [phone].pdf Page 10 of 13 Wound Measurements Length: (cm) 5 Width: (cm) 3 Depth: (cm) 2.5 Area: (cm) 11.781 Volume: (cm) 29.452 % Reduction in Area: -14812.7% % Reduction in Volume: -94906.5% Epithelialization: None Tunneling: No Undermining: No Wound Description Classification: Grade 2 Exudate Amount: Medium Exudate Type: Purulent Exudate Color: yellow, brown, green Foul Odor After Cleansing: No Slough/Fibrino Yes Wound Bed Granulation Amount: Small (1-33%) Exposed Structure Granulation Quality: Pink Fascia Exposed: No Necrotic Amount: Large (67-100%) Fat Layer (Subcutaneous Tissue) Exposed:  Yes Tendon Exposed: No Muscle Exposed: No Joint Exposed: No Bone Exposed: No Electronic Signature(s) Signed: 03/22/2023 8:01:26 AM By: [name] RN Entered By: [name] on 02/24/2023 08:09:29 -------------------------------------------------------------------------------- Wound Assessment Details Patient Name: Date of Service: [name] 02/24/2023 11:15 A M Medical Record Number: [id number] Patient Account Number: [id number] Date of Birth/Sex: Treating RN: Jul 27, 1980 (41 y.o. [name] Primary Care Raiden Haydu: [name] Other Clinician: Referring Jawanda Passey: Treating Lilyanne Mcquown/Extender: Greta Doom in Treatment: 21 Wound Status Wound Number: 12 Primary Diabetic Wound/Ulcer of the Lower Extremity Etiology: Wound Location: Left Metatarsal head fourth Wound Status: Open Wounding Event: Gradually Appeared Comorbid Hypertension, Peripheral Venous Disease, Type II Diabetes, Date Acquired: 02/21/2023 History: Osteomyelitis Weeks Of Treatment: 0 Clustered Wound: No Photos RANSFORD, VIELMAS ([id number]) [phone] Page 11 of 13 Wound Measurements Length: (cm) 1 Width: (cm) 1 Depth: (cm) 0.1 Area: (cm) 0.785 Volume: (cm) 0.079 % Reduction in Area: % Reduction in Volume: Epithelialization: None Tunneling: No Undermining: No Wound Description Classification: Grade 1 Exudate Amount: Medium Exudate Type: Serosanguineous Exudate Color: red, brown Foul Odor After Cleansing: No Slough/Fibrino Yes Wound Bed Granulation Amount: Medium (34-66%) Exposed Structure Granulation Quality: Pink Fascia Exposed: No Necrotic Amount: Medium (34-66%) Fat Layer (Subcutaneous Tissue) Exposed: Yes Necrotic Quality: Adherent Slough Tendon Exposed: No Muscle Exposed: No Joint Exposed: No Bone Exposed: No Treatment Notes Wound #12 (Metatarsal head fourth) Wound Laterality: Left Cleanser Soap and Water Discharge  Instruction: Gently cleanse wound with antibacterial soap, rinse and pat dry prior to dressing wounds Peri-Wound Care Topical Primary Dressing Silvercel Small 2x2 (in/in) Discharge Instruction: Apply Silvercel Small 2x2 (in/in) as instructed Secondary Dressing Gauze Discharge Instruction: As directed: dry, moistened with saline or moistened with Dakins Solution Kerlix 4.5 x 4.1 (in/yd) Discharge Instruction: Apply Kerlix 4.5 x 4.1 (in/yd) as instructed Secured With Medipore T - 35M Medipore H Soft Cloth Surgical T ape ape, 2x2 (in/yd) Compression Wrap Compression Stockings Add-Ons Electronic Signature(s) Signed: 03/22/2023 8:01:26 AM By: [name] RN Entered By: [name] on 02/24/2023 08:11:52 [name] ([id number]) [id number] Page 12 of 13 -------------------------------------------------------------------------------- Wound Assessment  Details Patient Name: Date of Service: ANDREAZ, KALMBACH 02/24/2023 11:15 A M Medical Record Number: [id number] Patient Account Number: [id number] Date of Birth/Sex: Treating RN: May 20, 1981 (41 y.o. [name]) [name] Primary Care Armend Hochstatter: [name] Other Clinician: Referring Dhruva Orndoff: Treating Chavez Rosol/Extender: Greta Doom in Treatment: 21 Wound Status Wound Number: 9 Primary Diabetic Wound/Ulcer of the Lower Extremity Etiology: Wound Location: Left, Lateral Metatarsal head fifth Wound Status: Open Wounding Event: Blister Comorbid Hypertension, Peripheral Venous Disease, Type II Diabetes, Date Acquired: 10/07/2022 History: Osteomyelitis Weeks Of Treatment: 18 Clustered Wound: No Photos Wound Measurements Length: (cm) 0.6 Width: (cm) 0.2 Depth: (cm) 0.8 Area: (cm) 0.094 Volume: (cm) 0.075 % Reduction in Area: 97.8% % Reduction in Volume: 91.3% Epithelialization: None Tunneling: No Undermining: No Wound Description Classification: Grade 3 Exudate Amount:  Medium Exudate Type: Serosanguineous Exudate Color: red, brown Foul Odor After Cleansing: No Slough/Fibrino Yes Wound Bed Granulation Amount: Small (1-33%) Exposed Structure Granulation Quality: Pink Fascia Exposed: No Necrotic Amount: Large (67-100%) Fat Layer (Subcutaneous Tissue) Exposed: Yes Necrotic Quality: Adherent Slough Tendon Exposed: No Muscle Exposed: No Joint Exposed: No Bone Exposed: No Treatment Notes Wound #9 (Metatarsal head fifth) Wound Laterality: Left, Lateral Cleanser Soap and Water Discharge Instruction: Gently cleanse wound with antibacterial soap, rinse and pat dry prior to dressing wounds Peri-Wound Care GOTTI, KIRSCH ([id number]) [phone].pdf Page 13 of 13 Topical Primary Dressing Silvercel Small 2x2 (in/in) Discharge Instruction: Apply Silvercel Small 2x2 (in/in) as instructed Secondary Dressing Gauze Discharge Instruction: As directed: dry, moistened with saline or moistened with Dakins Solution Kerlix 4.5 x 4.1 (in/yd) Discharge Instruction: Apply Kerlix 4.5 x 4.1 (in/yd) as instructed Secured With Medipore T - 27M Medipore H Soft Cloth Surgical T ape ape, 2x2 (in/yd) Compression Wrap Compression Stockings Add-Ons Electronic Signature(s) Signed: 03/22/2023 8:01:26 AM By: [name] RN Entered By: [name] on 02/24/2023 08:10:21 -------------------------------------------------------------------------------- Vitals Details Patient Name: Date of Service: [name] 02/24/2023 11:15 A M Medical Record Number: [id number] Patient Account Number: [id number] Date of Birth/Sex: Treating RN: January 14, 1981 (41 y.o. [name]) [name] Primary Care Chaim Gatley: [name] Other Clinician: Referring Shandria Clinch: Treating Jermari Tamargo/Extender: Greta Doom in Treatment: 21 Vital Signs Time Taken: 11:05 Temperature (F): 97.6 Height (in): 73 Pulse (bpm): 90 Weight (lbs): 330 Respiratory  Rate (breaths/min): 18 Body Mass Index (BMI): 43.5 Blood Pressure (mmHg): 124/78 Reference Range: 80 - 120 mg / dl Electronic Signature(s) Signed: 03/22/2023 8:01:26 AM By: [name] RN Entered By: [name] on 02/24/2023 08:06:00

## 2023-03-24 ENCOUNTER — Encounter: Payer: 59 | Admitting: Internal Medicine

## 2023-03-24 DIAGNOSIS — Z89412 Acquired absence of left great toe: Secondary | ICD-10-CM | POA: Diagnosis not present

## 2023-03-24 DIAGNOSIS — L97512 Non-pressure chronic ulcer of other part of right foot with fat layer exposed: Secondary | ICD-10-CM | POA: Diagnosis not present

## 2023-03-24 DIAGNOSIS — E08621 Diabetes mellitus due to underlying condition with foot ulcer: Secondary | ICD-10-CM | POA: Diagnosis not present

## 2023-03-24 DIAGNOSIS — Z89411 Acquired absence of right great toe: Secondary | ICD-10-CM | POA: Diagnosis not present

## 2023-03-24 DIAGNOSIS — M86672 Other chronic osteomyelitis, left ankle and foot: Secondary | ICD-10-CM | POA: Diagnosis not present

## 2023-03-24 DIAGNOSIS — E1140 Type 2 diabetes mellitus with diabetic neuropathy, unspecified: Secondary | ICD-10-CM | POA: Diagnosis not present

## 2023-03-24 DIAGNOSIS — I10 Essential (primary) hypertension: Secondary | ICD-10-CM | POA: Diagnosis not present

## 2023-03-24 DIAGNOSIS — Z794 Long term (current) use of insulin: Secondary | ICD-10-CM | POA: Diagnosis not present

## 2023-03-24 DIAGNOSIS — E119 Type 2 diabetes mellitus without complications: Secondary | ICD-10-CM | POA: Diagnosis not present

## 2023-03-24 DIAGNOSIS — L97522 Non-pressure chronic ulcer of other part of left foot with fat layer exposed: Secondary | ICD-10-CM | POA: Diagnosis not present

## 2023-03-25 ENCOUNTER — Telehealth: Payer: Self-pay

## 2023-03-25 ENCOUNTER — Other Ambulatory Visit: Payer: Self-pay

## 2023-03-25 NOTE — Progress Notes (Signed)
[name], [name] ([id number]) [id number] Page 1 of 11 Visit Report for 03/24/2023 Arrival Information Details Patient Name: Date of Service: [name], [name] 03/24/2023 10:00 A M Medical Record Number: [id number] Patient Account Number: [id number] Date of Birth/Sex: Treating RN: [birth year] (42 y.o. [name]) [name] Primary Care Oiva Dibari: [name] Other Clinician: Betha Loa Referring Equilla Que: Treating Mavin Dyke/Extender: Chauncey Mann, [name] in Treatment: 25 Visit Information History Since Last Visit All ordered tests and consults were completed: No Patient Arrived: Ambulatory Added or deleted any medications: No Arrival Time: 10:03 Any new allergies or adverse reactions: No Transfer Assistance: None Had a fall or experienced change in No Patient Identification Verified: Yes activities of daily living that may affect Secondary Verification Process Completed: Yes risk of falls: Patient Requires Transmission-Based Precautions: No Signs or symptoms of abuse/neglect since last visito No Patient Has Alerts: Yes Hospitalized since last visit: No Patient Alerts: Left ABI 1.03 05/13/22 Implantable device outside of the clinic excluding No Right ABI .78 05/13/22 cellular tissue based products placed in the center since last visit: Has Dressing in Place as Prescribed: Yes Pain Present Now: Yes Electronic Signature(s) Signed: 03/24/2023 5:07:07 PM By: Betha Loa Entered By: Betha Loa on 03/24/2023 07:06:32 -------------------------------------------------------------------------------- Clinic Level of Care Assessment Details Patient Name: Date of Service: [name], [name] 03/24/2023 10:00 A M Medical Record Number: [id number] Patient Account Number: [id number] Date of Birth/Sex: Treating RN: 05/11/1981 (42 y.o. [name] Primary Care Goldia Ligman: [name] Other Clinician: Betha Loa Referring Arlow Spiers: Treating Alvar Malinoski/Extender: [name], [name] in Treatment: 25 Clinic Level of Care Assessment Items TOOL 4 Quantity Score []  - 0 Use when only an EandM is performed on FOLLOW-UP visit ASSESSMENTS - Nursing Assessment / Reassessment X- 1 10 Reassessment of Co-morbidities (includes updates in patient status) X- 1 5 Reassessment of Adherence to Treatment Plan HOBBS, PAROLA ([id number]) [phone].pdf Page 2 of 11 ASSESSMENTS - Wound and Skin A ssessment / Reassessment []  - 0 Simple Wound Assessment / Reassessment - one wound X- 3 5 Complex Wound Assessment / Reassessment - multiple wounds []  - 0 Dermatologic / Skin Assessment (not related to wound area) ASSESSMENTS - Focused Assessment []  - 0 Circumferential Edema Measurements - multi extremities []  - 0 Nutritional Assessment / Counseling / Intervention []  - 0 Lower Extremity Assessment (monofilament, tuning fork, pulses) []  - 0 Peripheral Arterial Disease Assessment (using hand held doppler) ASSESSMENTS - Ostomy and/or Continence Assessment and Care []  - 0 Incontinence Assessment and Management []  - 0 Ostomy Care Assessment and Management (repouching, etc.) PROCESS - Coordination of Care X - Simple Patient / Family Education for ongoing care 1 15 []  - 0 Complex (extensive) Patient / Family Education for ongoing care []  - 0 Staff obtains Chiropractor, Records, T Results / Process Orders est []  - 0 Staff telephones HHA, Nursing Homes / Clarify orders / etc []  - 0 Routine Transfer to another Facility (non-emergent condition) []  - 0 Routine Hospital Admission (non-emergent condition) []  - 0 New Admissions / Manufacturing engineer / Ordering NPWT Apligraf, etc. , []  - 0 Emergency Hospital Admission (emergent condition) X- 1 10 Simple Discharge Coordination []  - 0 Complex (extensive) Discharge Coordination PROCESS - Special Needs []  -  0 Pediatric / Minor Patient Management []  - 0 Isolation Patient Management []  - 0 Hearing / Language / Visual special needs []  - 0 Assessment of Community assistance (transportation, D/C planning, etc.) []  - 0 Additional assistance /  Altered mentation []  - 0 Support Surface(s) Assessment (bed, cushion, seat, etc.) INTERVENTIONS - Wound Cleansing / Measurement []  - 0 Simple Wound Cleansing - one wound X- 3 5 Complex Wound Cleansing - multiple wounds X- 1 5 Wound Imaging (photographs - any number of wounds) []  - 0 Wound Tracing (instead of photographs) []  - 0 Simple Wound Measurement - one wound []  - 0 Complex Wound Measurement - multiple wounds INTERVENTIONS - Wound Dressings X - Small Wound Dressing one or multiple wounds 3 10 []  - 0 Medium Wound Dressing one or multiple wounds []  - 0 Large Wound Dressing one or multiple wounds X- 1 5 Application of Medications - topical []  - 0 Application of Medications - injection INTERVENTIONS - Miscellaneous []  - 0 External ear exam CLEMMON, BROOKE C ([id number]) [id number] Page 3 of 11 []  - 0 Specimen Collection (cultures, biopsies, blood, body fluids, etc.) []  - 0 Specimen(s) / Culture(s) sent or taken to Lab for analysis []  - 0 Patient Transfer (multiple staff / Michiel Sites Lift / Similar devices) []  - 0 Simple Staple / Suture removal (25 or less) []  - 0 Complex Staple / Suture removal (26 or more) []  - 0 Hypo / Hyperglycemic Management (close monitor of Blood Glucose) []  - 0 Ankle / Brachial Index (ABI) - do not check if billed separately X- 1 5 Vital Signs Has the patient been seen at the hospital within the last three years: Yes Total Score: 115 Level Of Care: New/Established - Level 3 Electronic Signature(s) Signed: 03/24/2023 5:07:07 PM By: Betha Loa Entered By: Betha Loa on 03/24/2023  07:45:03 -------------------------------------------------------------------------------- Encounter Discharge Information Details Patient Name: Date of Service: [name] 03/24/2023 10:00 A M Medical Record Number: [id number] Patient Account Number: [id number] Date of Birth/Sex: Treating RN: 1981/01/11 (42 y.o. [name] Primary Care Sophia Sperry: [name] Other Clinician: Betha Loa Referring Zyia Kaneko: Treating Ingra Rother/Extender: [name], [name] in Treatment: 25 Encounter Discharge Information Items Discharge Condition: Stable Ambulatory Status: Ambulatory Discharge Destination: Home Transportation: Private Auto Accompanied By: self Schedule Follow-up Appointment: No Clinical Summary of Care: Electronic Signature(s) Signed: 03/24/2023 5:07:07 PM By: Betha Loa Entered By: Betha Loa on 03/24/2023 07:58:35 Lower Extremity Assessment Details -------------------------------------------------------------------------------- [name] ([id number]) [id number] Page 4 of 11 Patient Name: Date of Service: [name], [name] 03/24/2023 10:00 A M Medical Record Number: [id number] Patient Account Number: [id number] Date of Birth/Sex: Treating RN: 1981-05-12 (41 y.o. [name]) [name] Primary Care Marya Lowden: [name] Other Clinician: Betha Loa Referring Ed Rayson: Treating Kamyla Olejnik/Extender: RO BSO [name], [name] in Treatment: 25 Electronic Signature(s) Signed: 03/24/2023 5:07:07 PM By: Betha Loa Signed: 03/25/2023 3:34:18 PM By: [name] RN Entered By: Betha Loa on 03/24/2023 07:23:23 -------------------------------------------------------------------------------- Multi Wound Chart Details Patient Name: Date of Service: [name] 03/24/2023 10:00 A M Medical Record Number: [id number] Patient Account Number: [id number] Date of  Birth/Sex: Treating RN: 08/18/1980 (41 y.o. [name] Primary Care Sibley Rolison: [name] Other Clinician: Betha Loa Referring Joellyn Grandt: Treating Radonna Bracher/Extender: Chauncey Mann, [name] [name], [name] in Treatment: 25 Vital Signs Height(in): 73 Pulse(bpm): 87 Weight(lbs): 330 Blood Pressure(mmHg): 133/88 Body Mass Index(BMI): 43.5 Temperature(F): 98.5 Respiratory Rate(breaths/min): 18 [10:Photos:] Left Amputation Site - Toe Left Metatarsal head fourth Left, Lateral Metatarsal head fifth Wound Location: Shear/Friction Gradually Appeared Blister Wounding Event: Diabetic Wound/Ulcer of the Lower Diabetic Wound/Ulcer of the Lower Diabetic Wound/Ulcer of the Lower Primary Etiology: Extremity Extremity Extremity Hypertension, Peripheral Venous  Hypertension, Peripheral Venous Hypertension, Peripheral Venous Comorbid History: Disease, Type II Diabetes, Disease, Type II Diabetes, Disease, Type II Diabetes, Osteomyelitis Osteomyelitis Osteomyelitis 11/18/2022 02/21/2023 10/07/2022 Date Acquired: 18 4 22  Weeks of Treatment: Healed - Epithelialized Healed - Epithelialized Healed - Epithelialized Wound Status: No No No Wound Recurrence: 0x0x0 0x0x0 0x0x0 Measurements L x W x D (cm) 0 0 0 A (cm) : rea 0 0 0 Volume (cm) : 100.00% 100.00% 100.00% % Reduction in A rea: 100.00% 100.00% 100.00% % Reduction in Volume: Grade 1 Grade 1 Grade 3 Classification: None Present None Present None Present Exudate A mount: None Present (0%) None Present (0%) None Present (0%) Granulation A mount: None Present (0%) None Present (0%) None Present (0%) Necrotic A mount: Fat Layer (Subcutaneous Tissue): Yes Fat Layer (Subcutaneous Tissue): Yes Fat Layer (Subcutaneous Tissue): Yes Exposed Structures: Fascia: No Fascia: No Fascia: No Tendon: No Tendon: No Tendon: No Muscle: No Muscle: No Muscle: No HAMDAN, BANTER C ([id number]) [id number]  Page 5 of 11 Joint: No Joint: No Joint: No Bone: No Bone: No Bone: No Large (67-100%) Large (67-100%) Large (67-100%) Epithelialization: Treatment Notes Electronic Signature(s) Signed: 03/24/2023 5:07:07 PM By: Betha Loa Entered By: Betha Loa on 03/24/2023 07:42:22 -------------------------------------------------------------------------------- Multi-Disciplinary Care Plan Details Patient Name: Date of Service: [name] 03/24/2023 10:00 A M Medical Record Number: [id number] Patient Account Number: [id number] Date of Birth/Sex: Treating RN: 12-16-1980 (41 y.o. [name] Primary Care Marwan Lipe: [name] Other Clinician: Betha Loa Referring Damarys Speir: Treating Olukemi Panchal/Extender: [name], [name] [name], [name] in Treatment: 25 Active Inactive Electronic Signature(s) Signed: 03/24/2023 5:07:07 PM By: Betha Loa Signed: 03/25/2023 3:34:18 PM By: [name] RN Entered By: Betha Loa on 03/24/2023 07:55:17 -------------------------------------------------------------------------------- Pain Assessment Details Patient Name: Date of Service: [name] 03/24/2023 10:00 A M Medical Record Number: [id number] Patient Account Number: [id number] Date of Birth/Sex: Treating RN: [birth year] (41 y.o. [name] Primary Care Lolita Faulds: [name] Other Clinician: Betha Loa Referring [place]: Treating Marty Sadlowski/Extender: RO BSO [name], [name] in Treatment: 25 Active Problems Location of Pain Severity and Description of Pain Patient Has Paino Yes Site Locations Pain LocationCAELEN, [name] ([id number]) [id number] Page 6 of 11 Pain Location: Generalized Pain, Pain in Ulcers Duration of the Pain. Constant / Intermittento Intermittent Rate the pain. Current Pain Level: 1 Character of Pain Describe the Pain: Aching Pain Management and  Medication Current Pain Management: Medication: Yes Cold Application: No Rest: No Massage: No Activity: No T.E.N.S.: No Heat Application: No Leg drop or elevation: No Is the Current Pain Management Adequate: Inadequate How does your wound impact your activities of daily livingo Sleep: No Bathing: No Appetite: No Relationship With Others: No Bladder Continence: No Emotions: No Bowel Continence: No Work: No Toileting: No Drive: No Dressing: No Hobbies: No Electronic Signature(s) Signed: 03/24/2023 5:07:07 PM By: Betha Loa Signed: 03/25/2023 3:34:18 PM By: [name] RN Entered By: Betha Loa on 03/24/2023 07:14:04 -------------------------------------------------------------------------------- Patient/Caregiver Education Details Patient Name: Date of Service: [name] 10/30/2024andnbsp10:00 A M Medical Record Number: [id number] Patient Account Number: [id number] Date of Birth/Gender: Treating RN: 01-30-1981 (41 y.o. [name] Primary Care Physician: [name] Other Clinician: Betha Loa Referring Physician: Treating Physician/Extender: [name], [name] in Treatment: 25 Education Assessment Education Provided To: Patient Education Topics Provided Wound/Skin Impairment: Handouts: Other: Congratulations! Your healed. continue to follow up with surgeon, call if any other issues arise Mefferd, Quinten  C ([id number]) [id number] Page 7 of 11 Methods: Explain/Verbal Responses: State content correctly Electronic Signature(s) Signed: 03/24/2023 5:07:07 PM By: Betha Loa Entered By: Betha Loa on 03/24/2023 07:56:42 -------------------------------------------------------------------------------- Wound Assessment Details Patient Name: Date of Service: [name] 03/24/2023 10:00 A M Medical Record Number: [id number] Patient Account Number: [id number] Date of  Birth/Sex: Treating RN: 18-Dec-1980 (41 y.o. 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 Location: Left Amputation Site - Toe Wound Status: Healed - Epithelialized Wounding Event: Shear/Friction Comorbid Hypertension, Peripheral Venous Disease, Type II Diabetes, Date Acquired: 11/18/2022 History: Osteomyelitis Weeks Of Treatment: 18 Clustered Wound: No Photos Wound Measurements Length: (cm) Width: (cm) Depth: (cm) Area: (cm) Volume: (cm) 0 % Reduction in Area: 100% 0 % Reduction in Volume: 100% 0 Epithelialization: Large (67-100%) 0 0 Wound Description Classification: Grade 1 Exudate Amount: None Present Foul Odor After Cleansing: No Slough/Fibrino No Wound Bed Granulation Amount: None Present (0%) Exposed Structure Necrotic Amount: None Present (0%) Fascia Exposed: No Fat Layer (Subcutaneous Tissue) Exposed: Yes Tendon Exposed: No Muscle Exposed: No Joint Exposed: No Bone Exposed: No FROYLAN, [name] C ([id number]) [id number] Page 8 of 11 Treatment Notes Wound #10 (Amputation Site - Toe) Wound Laterality: Left Cleanser Peri-Wound Care Topical Primary Dressing Secondary Dressing Secured With Compression Wrap Compression Stockings Add-Ons Electronic Signature(s) Signed: 03/24/2023 5:07:07 PM By: Betha Loa Signed: 03/25/2023 3:34:18 PM By: [name] RN Entered By: Betha Loa on 03/24/2023 07:41:11 -------------------------------------------------------------------------------- Wound Assessment Details Patient Name: Date of Service: [name] 03/24/2023 10:00 A M Medical Record Number: [id number] Patient Account Number: [id number] Date of Birth/Sex: Treating  RN: Oct 25, 1980 (41 y.o. [name]) [name] Primary Care Ariv Penrod: [name] Other Clinician: Betha Loa Referring Revan Gendron: Treating Ranesha Val/Extender: [name], [name] [name], [name] in Treatment: 25 Wound Status Wound Number: 12 Primary Diabetic Wound/Ulcer of the Lower Extremity Etiology: Wound Location: Left Metatarsal head fourth Wound Status: Healed - Epithelialized Wounding Event: Gradually Appeared Comorbid Hypertension, Peripheral Venous Disease, Type II Diabetes, Date Acquired: 02/21/2023 History: Osteomyelitis Weeks Of Treatment: 4 Clustered Wound: No Photos Wound Measurements Length: (cm) 0 Width: (cm) 0 Depth: (cm) 0 Area: (cm) 0 Lui, Tevis C ([id number]) Volume: (cm) 0 % Reduction in Area: 100% % Reduction in Volume: 100% Epithelialization: Large (67-100%) [id number] Page 9 of 11 Wound Description Classification: Grade 1 Exudate Amount: None Present Foul Odor After Cleansing: No Slough/Fibrino No Wound Bed Granulation Amount: None Present (0%) Exposed Structure Necrotic Amount: None Present (0%) Fascia Exposed: No Fat Layer (Subcutaneous Tissue) Exposed: Yes Tendon Exposed: No Muscle Exposed: No Joint Exposed: No Bone Exposed: No Treatment Notes Wound #12 (Metatarsal head fourth) Wound Laterality: Left Cleanser Peri-Wound Care Topical Primary Dressing Secondary Dressing Secured With Compression Wrap Compression Stockings Add-Ons Electronic Signature(s) Signed: 03/24/2023 5:07:07 PM By: Betha Loa Signed: 03/25/2023 3:34:18 PM By: [name] RN Entered By: Betha Loa on 03/24/2023 07:41:40 -------------------------------------------------------------------------------- Wound Assessment Details Patient Name: Date of Service: [name] 03/24/2023 10:00 A M Medical Record Number: [id number] Patient Account Number: [id number] Date of Birth/Sex: Treating RN: 07-06-1980 (41  y.o. [name] Primary Care Senica Crall: [name] Other Clinician: Betha Loa Referring Jakyle Petrucelli: Treating Owin Vignola/Extender: [name], [name] in Treatment: 25 Wound Status Wound Number: 9 Primary Diabetic Wound/Ulcer of the Lower  Extremity Etiology: Wound Location: Left, Lateral Metatarsal head fifth Wound Status: Healed - Epithelialized Wounding Event: Blister Comorbid Hypertension, Peripheral Venous Disease, Type II Diabetes, Date Acquired: 10/07/2022 History: Osteomyelitis Weeks Of Treatment: 22 Clustered Wound: No Photos [name], [name] ([id number]) [id number] Page 10 of 11 Wound Measurements Length: (cm) Width: (cm) Depth: (cm) Area: (cm) Volume: (cm) 0 % Reduction in Area: 100% 0 % Reduction in Volume: 100% 0 Epithelialization: Large (67-100%) 0 0 Wound Description Classification: Grade 3 Exudate Amount: None Present Foul Odor After Cleansing: No Slough/Fibrino No Wound Bed Granulation Amount: None Present (0%) Exposed Structure Necrotic Amount: None Present (0%) Fascia Exposed: No Fat Layer (Subcutaneous Tissue) Exposed: Yes Tendon Exposed: No Muscle Exposed: No Joint Exposed: No Bone Exposed: No Treatment Notes Wound #9 (Metatarsal head fifth) Wound Laterality: Left, Lateral Cleanser Peri-Wound Care Topical Primary Dressing Secondary Dressing Secured With Compression Wrap Compression Stockings Add-Ons Electronic Signature(s) Signed: 03/24/2023 5:07:07 PM By: Betha Loa Signed: 03/25/2023 3:34:18 PM By: [name] RN Entered By: Betha Loa on 03/24/2023 07:42:06 -------------------------------------------------------------------------------- Vitals Details Patient Name: Date of Service: [name] 03/24/2023 10:00 A [name] [name], Sherian Rein ([id number]) [id number] Page 11 of 11 Medical Record Number: [id number] Patient Account  Number: [id number] Date of Birth/Sex: Treating RN: July 10, 1980 (41 y.o. [name]) [name] Primary Care Antoin Dargis: [name] Other Clinician: Betha Loa Referring [place] Hafen: Treating Lennox Dolberry/Extender: RO BSO [name], [name] [name], [name] in Treatment: 25 Vital Signs Time Taken: 10:11 Temperature (F): 98.5 Height (in): 73 Pulse (bpm): 87 Weight (lbs): 330 Respiratory Rate (breaths/min): 18 Body Mass Index (BMI): 43.5 Blood Pressure (mmHg): 133/88 Reference Range: 80 - 120 mg / dl Electronic Signature(s) Signed: 03/24/2023 5:07:07 PM By: Betha Loa Entered By: Betha Loa on 03/24/2023 07:13:59

## 2023-03-25 NOTE — Progress Notes (Signed)
[name], [name] ([id number]) [id number] Page 1 of 8 Visit Report for 03/24/2023 HPI Details Patient Name: Date of Service: [name], [name] 03/24/2023 10:00 A M Medical Record Number: [id number] Patient Account Number: [id number] Date of Birth/Sex: Treating RN: 1980/08/01 (41 y.o. [name]) [name] Primary Care Provider: [name] Other Clinician: Betha Loa Referring Provider: Treating Provider/Extender: Chauncey Mann, [name] in Treatment: 25 History of Present Illness HPI Description: 05/13/2022 Mr. [name] is a 42 year old male with uncontrolled insulin-dependent type 2 diabetes with last hemoglobin A1c of 11.1, previous feet wounds that led to bilateral great toe amputations, ADHD and bipolar depression that presents the clinic for a 1 month history of non healing ulcers to the Feet bilaterally. His previous bilateral great toe amputation sites have healed. He has been using an antibacterial spray to keep the areas clean. He has not been dressing the wounds. He states that over the past couple days he has had increased redness and warmth to the right foot. He has regular tennis shoes. He does not use any offloading device. He states he walks around in socks and does not go barefoot. 05/20/2022. This is a 42 year old man with type 2 diabetes and neuropathy. He has wounds on his right first metatarsal head and the base of his right fourth and fifth toes. He has been using Medihoney. Apparently there was a plan to have him casted today. He has had previously amputated first toes bilaterally a year to a year and a half ago by Dr. Lajoyce Corners for underlying osteomyelitis according to the patient. The patient has not had any foot wear. Because his forefoot is so wide he simply ordered his shoes online. Last week because of redness and warmth on the left foot he was put on Augmentin and  doxycycline READMISSION 09/30/2022 This is a 42 year old man who is a type II diabetic with diabetic peripheral neuropathy. He was here for the clinic in the clinic in late September 2023 for 2 visits only and on the left foot had very similar wounds to currently. He tells [name] he left the clinic related to a problem with his underlying bipolar disorder. In any case he still has an area on the left first metatarsal head and at the base of the fourth and fifth toes. This is very similar by my memory to what was present in December 2023. He tells [name] that last week or 2 this became somewhat red and warm. He used doxycycline and amoxicillin for 5 days that he had leftover from a previous visit and that helped. At this point I am not sure about his diabetes control. He had peg assist shoes last time he was here he has not been wearing these Past medical history includes type 2 diabetes with peripheral neuropathy, and bilateral great toe amputations, ADHD, bipolar disorder, ABIs in our clinic were 0.78 on the right he does not have wounds here and 1.03 on the left which is his wounded side 5/15; patient presents for follow-up. He has been using Hydrofera Blue to the wound beds. The lateral left foot wound has healed. He has 2 remaining wounds. He has no issues or complaints today. 5/29; patient presents for follow-up. He has been using Hydrofera Blue to the wound beds. Unfortunately the lateral left foot wound has reopened. He denies signs of infection. 6/5 the patient had wounds on his first and fourth metatarsal heads which have healed. Unfortunately he comes in with erythema around the area on  the left fifth metatarsal head. 6/12; patient presents for follow-up. He was prescribed doxycycline at last clinic visit due to increased erythema to the left fifth met head. According to the patient he did not pick this up. A culture was also done that grew Streptococcus sensitive to amoxicillin. This was sent in  however the patient did not pick this up either. Wound has declined in appearance and there is increased erythema to the periwound however this is not streaking or progressing compared to last clinic pictures. He knows to go to the ED if he were to have worsening symptoms including increased pain, erythema, warmth or purulent drainage 6/19; patient presents for follow-up. He has been taking Augmentin and doxycycline. His symptoms have improved with decreased erythema and swelling to the left foot/periwound. He has been using Hydrofera Blue. He denies signs of infection. 6/26; patient presents for follow-up. He has finished Augmentin and doxycycline. He has been using Hydrofera Blue to the wound bed. He has no issues or complaints. He does have excessive callus to the first met head with now a crack and open wound. 7/3; patient presents for follow-up. He continues to take Augmentin and doxycycline. He has been using Hydrofera Blue to the first met head amputation site wound and Vashe wet-to-dry dressings to the fifth met head wound. 7/10; patient presents for follow-up. He is taking Augmentin and doxycycline. He has been using Hydrofera Blue to the first met head and Vashe wet-to-dry dressings to the fifth met head. Minimal improvement. 7/17; patient presents for follow-up. He continues to take Augmentin and doxycycline without issues. He had an x-ray done at last clinic visit that shows osteomyelitis to the fifth met head. Hyperbaric oxygen therapy was discussed and patient would like to proceed with this. He would like to avoid amputation at BREWSTER, KUZARA ([id number]) [id number] Page 2 of 8 this time. He currently denies systemic signs of infection. 7/24; patient presents for follow-up. He has completed 6 weeks of Augmentin and doxycycline. We have run insurance verification for hyperbaric oxygen therapy and waiting for approval. He has been using Vashe wet-to-dry  dressings to the lateral left foot wound and Hydrofera Blue to the plantar aspect of the first met head amputation site. He currently denies systemic signs of infection. He is using an offloading shoe. 7/31; patient presents for follow-up. He has been approved for HBO But due to out-of-pocket cost patient will let us know if he we will proceed with this. He has been using Vashe wet-to-dry dressings to the lateral foot wound and Hydrofera Blue to the plantar aspect of the first met head amputation site. He denies systemic signs of infection. He has not been using his offloading shoe. 8/7; patient presents for follow-up. He had a deep tissue culture done at last clinic visit that grew providencia rettgeri and group B strep. He has already had 4 weeks of doxycycline and Augmentin. The providencia rettgeri Is sensitive to ciprofloxacin and this was called into the pharmacy. He has not started this yet. He has been using Vashe wet-to-dry dressings to the left lateral wound and Hydrofera Blue to the plantar wound. 8/14; patient was seen today in conjunction with HBO which she is started for underlying osteomyelitis. The osteomyelitis is in the left fifth met head. He is tolerating hyperbarics well. The patient has 2 wounds on the left foot the problematic wound on the lateral part of the fifth metatarsal head and a plantar wound on the first metatarsal head also on the  left. We are using wet-to-dry Vashe to both wound areas they are measuring smaller. He had finished antibiotics on 7/3 however he was put on Cipro last week. He is tolerating this well. 8/21; patient presents for follow-up. 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 He is using Vashe wet-to-dry dressings to the left fifth met head and Hydrofera Blue to the plantar aspect of the left foot. He has completed ciprofloxacin without issues and has not had any oral antibiotics over  the past 5 days and has done well. He currently denies signs of infection. 8/28; patient presents for follow-up. He continues to have HBO therapy without issues. He has been using Vashe wet-to-dry dressings to the left fifth met head and Hydrofera Blue to the plantar aspect of the left foot. Both wounds appear well-healing. He currently denies signs of infection. 9/4; patient presents for follow-up. He has been using Hydrofera Blue to the plantar left foot wound and Vashe wet-to-dry dressings to the left fifth met head. He mentions today that he has a wound to the right plantar foot. This is the first time he has mentioned it. He states it has been there for several weeks and has gotten better. He has been using Hydrofera Blue to this area. 9/11; patient presents for follow-up. He has 3 wounds 2 on the left foot and 1 on the right foot. These are diabetic foot ulcers. He is doing hyperbaric oxygen therapy for the left lateral foot wound with chronic osteomyelitis. All wounds appear well-healing. He has no issues or complaints. 9/18; patient was seen today in conjunction with hyperbaric oxygen that he is tolerating well. He is left with 3 small wounds each of which is in roughly the same condition. He has an area over the plantar right first metatarsal head, the plantar left first metatarsal head and just to the lateral of the left fifth metatarsal head. Each of these has thick callus and skin around the edges which really obscures the totality of the wound. We have been using Hydrofera Blue gauze and tape. He has offloading shoes 9/25; patient I saw for the first time last week. He has wounds on his right first left first and left fifth mid metatarsal heads. Previously diagnosed as having chronic osteomyelitis in the left fifth met head for this reason he is receiving HBO and for that he is tolerating it well. He arrives in today with a statement about fluid leaking out of the wound puffiness around  the margins of the callus which surround his small wound on the right first met head. We switched him to endoform last week to try and get some granulation. Very clearly all 3 of these wounds have some depth which is probably not healthy granulation 10/2; patient was seen after HBO today. He had a new probing area above the wound on the right first metatarsal head that was draining purulent drainage. Also very concerning spreading erythema up his right foot bruising on the right medial lower leg that was tender. The area on the left first and left fifth metatarsal head looked unchanged but he had what looked to be a new area towards the base of the fourth fifth toe interface. His vital signs were stable prior to HBO however his blood sugar was over 300. He complained of pain in the leg but denied trauma. Culture I did last week's and [facility] showing Staph aureus but does not identify sensitivities. X-rays  were done but there is no report. I gave him doxycycline starting a week ago today but it looks as though he did not get this right away however he is taking it now 10/30; after the patient was here last time we sent him to the hospital. He was admitted from 02/24/2023 through 02/28/2023. He was felt to have osteomyelitis and cellulitis. On 10/3 he had a partial right first ray amputation with application of antibiotic beads and a bone biopsy of the left foot bone with debridement of wounds on the left foot. He has been using mupirocin with gauze. He had a weightbearing heel contact shoe but he switched into his regular shoes diabetic shoes have been ordered.His bone biopsy showed chronic osteomyelitis. He is completed 2 weeks worth of Augmentin Everything is considerably better. He has a healed area on the right first met head remanent with eschar but no open area. No open area on the left fifth toe although there is some bluish discoloration but no palpable tenderness or warmth. Scattered eschars but  nothing looks serious here. Electronic Signature(s) Signed: 03/24/2023 4:47:46 PM By: Baltazar Najjar MD Entered By: Baltazar Najjar on 03/24/2023 10:53:22 -------------------------------------------------------------------------------- Physical Exam Details Patient Name: Date of Service: [name] 03/24/2023 10:00 A M Medical Record Number: [id number] Patient Account Number: [id number] Date of Birth/Sex: Treating RN: 03/14/1981 ([address] y.o. [name]) Dez, Stachurski, Manor C ([id number]) [id number] Page 3 of 8 Primary Care Provider: [name] Other Clinician: Betha Loa Referring Provider: Treating Provider/Extender: [name], [name] [name], [name] in Treatment: 25 Constitutional Sitting or standing Blood Pressure is within target range for patient.. Pulse regular and within target range for patient.Marland Kitchen Respirations regular, non-labored and within target range.. Temperature is normal and within the target range for the patient.Marland Kitchen appears in no distress. Notes Wound exam; right foot everything is closed on the right here. Eschared area over the first metatarsal head but I see no opening or threatening opening. On the left foot the first and fifth metatarsal heads are closed. I see no open wounds Electronic Signature(s) Signed: 03/24/2023 4:47:46 PM By: Baltazar Najjar MD Entered By: Baltazar Najjar on 03/24/2023 10:54:14 -------------------------------------------------------------------------------- Physician Orders Details Patient Name: Date of Service: [name] 03/24/2023 10:00 A M Medical Record Number: [id number] Patient Account Number: [id number] Date of Birth/Sex: Treating RN: [birth year] (41 y.o. [name] Primary Care Provider: [name] Other Clinician: Betha Loa Referring Provider: Treating Provider/Extender: Chauncey Mann, [name] in Treatment: 25 The following  information was scribed by: Betha Loa The information was scribed for: Maxwell Caul Verbal / Phone Orders: No Diagnosis Coding Discharge From [facility] from [facility] Treatment Complete Additional Orders / Instructions Vitamin A Vitamin C, Zinc ; Other: - apply urea cream 40% or any lotion to feet/legs daily Hyperbaric Oxygen Therapy Discontinue HBO Therapy Medications-Please add to medication list. ntibiotic - continue applying Mupirocin to areas per Dr. Excell Seltzer Topical A Electronic Signature(s) Signed: 03/24/2023 4:47:46 PM By: Baltazar Najjar MD Signed: 03/24/2023 5:07:07 PM By: Betha Loa Entered By: Betha Loa on 03/24/2023 10:44:14 [name] ([id number]) [id number] Page 4 of 8 -------------------------------------------------------------------------------- Problem List Details Patient Name: Date of Service: ZEKE, PIRILLO 03/24/2023 10:00 A M Medical Record Number: [id number] Patient Account Number: [id number] Date of Birth/Sex: Treating RN: 08-22-1980 (41 y.o. [name]) [name] Primary Care Provider: [name] Other Clinician: Betha Loa Referring Provider: Treating Provider/Extender: [name], [name] G  [name] Weeks in Treatment: 25 Active Problems ICD-10 Encounter Code Description Active Date MDM Diagnosis [id number] Other chronic osteomyelitis, left ankle and foot 12/16/2022 No Yes E11.621 Type 2 diabetes mellitus with foot ulcer 09/30/2022 No Yes L97.528 Non-pressure chronic ulcer of other part of left foot with other specified 09/30/2022 No Yes severity L97.522 Non-pressure chronic ulcer of other part of left foot with fat layer exposed 12/16/2022 No Yes L97.512 Non-pressure chronic ulcer of other part of right foot with fat layer exposed 01/27/2023 No Yes E11.40 Type 2 diabetes mellitus with diabetic neuropathy, unspecified 09/30/2022 No Yes Inactive Problems Resolved  Problems Electronic Signature(s) Signed: 03/24/2023 4:47:46 PM By: Baltazar Najjar MD Entered By: Baltazar Najjar on 03/24/2023 10:46:06 [name] ([id number]) [id number] Page 5 of 8 -------------------------------------------------------------------------------- Progress Note Details Patient Name: Date of Service: [name], [name] 03/24/2023 10:00 A M Medical Record Number: [id number] Patient Account Number: [id number] Date of Birth/Sex: Treating RN: 09-08-1980 (41 y.o. [name]) [name] Primary Care Provider: [name] Other Clinician: Betha Loa Referring Provider: Treating Provider/Extender: Chauncey Mann, [name] in Treatment: 25 Subjective History of Present Illness (HPI) 05/13/2022 Mr. Dayln Yust is a 42 year old male with uncontrolled insulin-dependent type 2 diabetes with last hemoglobin A1c of 11.1, previous feet wounds that led to bilateral great toe amputations, ADHD and bipolar depression that presents the clinic for a 1 month history of non healing ulcers to the Feet bilaterally. His previous bilateral great toe amputation sites have healed. He has been using an antibacterial spray to keep the areas clean. He has not been dressing the wounds. He states that over the past couple days he has had increased redness and warmth to the right foot. He has regular tennis shoes. He does not use any offloading device. He states he walks around in socks and does not go barefoot. 05/20/2022. This is a 42 year old man with type 2 diabetes and neuropathy. He has wounds on his right first metatarsal head and the base of his right fourth and fifth toes. He has been using Medihoney. Apparently there was a plan to have him casted today. He has had previously amputated first toes bilaterally a year to a year and a half ago by Dr. Lajoyce Corners for underlying osteomyelitis according to the patient. The patient has not had any  foot wear. Because his forefoot is so wide he simply ordered his shoes online. Last week because of redness and warmth on the left foot he was put on Augmentin and doxycycline READMISSION 09/30/2022 This is a 42 year old man who is a type II diabetic with diabetic peripheral neuropathy. He was here for the clinic in the clinic in late September 2023 for 2 visits only and on the left foot had very similar wounds to currently. He tells [name] he left the clinic related to a problem with his underlying bipolar disorder. In any case he still has an area on the left first metatarsal head and at the base of the fourth and fifth toes. This is very similar by my memory to what was present in December 2023. He tells [name] that last week or 2 this became somewhat red and warm. He used doxycycline and amoxicillin for 5 days that he had leftover from a previous visit and that helped. At this point I am not sure about his diabetes control. He had peg assist shoes last time he was here he has not been wearing these Past medical history includes type 2 diabetes with peripheral  neuropathy, and bilateral great toe amputations, ADHD, bipolar disorder, ABIs in our clinic were 0.78 on the right he does not have wounds here and 1.03 on the left which is his wounded side 5/15; patient presents for follow-up. He has been using Hydrofera Blue to the wound beds. The lateral left foot wound has healed. He has 2 remaining wounds. He has no issues or complaints today. 5/29; patient presents for follow-up. He has been using Hydrofera Blue to the wound beds. Unfortunately the lateral left foot wound has reopened. He denies signs of infection. 6/5 the patient had wounds on his first and fourth metatarsal heads which have healed. Unfortunately he comes in with erythema around the area on the left fifth metatarsal head. 6/12; patient presents for follow-up. He was prescribed doxycycline at last clinic visit due to increased erythema to  the left fifth met head. According to the patient he did not pick this up. A culture was also done that grew Streptococcus sensitive to amoxicillin. This was sent in however the patient did not pick this up either. Wound has declined in appearance and there is increased erythema to the periwound however this is not streaking or progressing compared to last clinic pictures. He knows to go to the ED if he were to have worsening symptoms including increased pain, erythema, warmth or purulent drainage 6/19; patient presents for follow-up. He has been taking Augmentin and doxycycline. His symptoms have improved with decreased erythema and swelling to the left foot/periwound. He has been using Hydrofera Blue. He denies signs of infection. 6/26; patient presents for follow-up. He has finished Augmentin and doxycycline. He has been using Hydrofera Blue to the wound bed. He has no issues or complaints. He does have excessive callus to the first met head with now a crack and open wound. 7/3; patient presents for follow-up. He continues to take Augmentin and doxycycline. He has been using Hydrofera Blue to the first met head amputation site wound and Vashe wet-to-dry dressings to the fifth met head wound. 7/10; patient presents for follow-up. He is taking Augmentin and doxycycline. He has been using Hydrofera Blue to the first met head and Vashe wet-to-dry dressings to the fifth met head. Minimal improvement. 7/17; patient presents for follow-up. He continues to take Augmentin and doxycycline without issues. He had an x-ray done at last clinic visit that shows osteomyelitis to the fifth met head. Hyperbaric oxygen therapy was discussed and patient would like to proceed with this. He would like to avoid amputation at this time. He currently denies systemic signs of infection. 7/24; patient presents for follow-up. He has completed 6 weeks of Augmentin and doxycycline. We have run insurance verification for  hyperbaric oxygen therapy and waiting for approval. He has been using Vashe wet-to-dry dressings to the lateral left foot wound and Hydrofera Blue to the plantar aspect of the first met head amputation site. He currently denies systemic signs of infection. He is using an offloading shoe. 7/31; patient presents for follow-up. He has been approved for HBO But due to out-of-pocket cost patient will let us know if he we will proceed with this. He has been using Vashe wet-to-dry dressings to the lateral foot wound and Hydrofera Blue to the plantar aspect of the first met head amputation site. He denies systemic signs of infection. He has not been using his offloading shoe. 8/7; patient presents for follow-up. He had a deep tissue culture done at last clinic visit that grew providencia rettgeri and group B strep.  He has already had 4 weeks of doxycycline and Augmentin. The providencia rettgeri Is sensitive to ciprofloxacin and this was called into the pharmacy. He has not started this yet. He has been using Vashe wet-to-dry dressings to the left lateral wound and Hydrofera Blue to the plantar wound. 8/14; patient was seen today in conjunction with HBO which she is started for underlying osteomyelitis. The osteomyelitis is in the left fifth met head. He is tolerating hyperbarics well. The patient has 2 wounds on the left foot the problematic wound on the lateral part of the fifth metatarsal head and a plantar wound on the first metatarsal head also on the left. We are using wet-to-dry Vashe to both wound areas they are measuring smaller. DEMARQUIS, BRAGANZA ([id number]) [id number] Page 6 of 8 He had finished antibiotics on 7/3 however he was put on Cipro last week. He is tolerating this well. 8/21; patient presents for follow-up. 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 He is using Vashe wet-to-dry dressings to  the left fifth met head and Hydrofera Blue to the plantar aspect of the left foot. He has completed ciprofloxacin without issues and has not had any oral antibiotics over the past 5 days and has done well. He currently denies signs of infection. 8/28; patient presents for follow-up. He continues to have HBO therapy without issues. He has been using Vashe wet-to-dry dressings to the left fifth met head and Hydrofera Blue to the plantar aspect of the left foot. Both wounds appear well-healing. He currently denies signs of infection. 9/4; patient presents for follow-up. He has been using Hydrofera Blue to the plantar left foot wound and Vashe wet-to-dry dressings to the left fifth met head. He mentions today that he has a wound to the right plantar foot. This is the first time he has mentioned it. He states it has been there for several weeks and has gotten better. He has been using Hydrofera Blue to this area. 9/11; patient presents for follow-up. He has 3 wounds 2 on the left foot and 1 on the right foot. These are diabetic foot ulcers. He is doing hyperbaric oxygen therapy for the left lateral foot wound with chronic osteomyelitis. All wounds appear well-healing. He has no issues or complaints. 9/18; patient was seen today in conjunction with hyperbaric oxygen that he is tolerating well. He is left with 3 small wounds each of which is in roughly the same condition. He has an area over the plantar right first metatarsal head, the plantar left first metatarsal head and just to the lateral of the left fifth metatarsal head. Each of these has thick callus and skin around the edges which really obscures the totality of the wound. We have been using Hydrofera Blue gauze and tape. He has offloading shoes 9/25; patient I saw for the first time last week. He has wounds on his right first left first and left fifth mid metatarsal heads. Previously diagnosed as having chronic osteomyelitis in the left fifth met  head for this reason he is receiving HBO and for that he is tolerating it well. He arrives in today with a statement about fluid leaking out of the wound puffiness around the margins of the callus which surround his small wound on the right first met head. We switched him to endoform last week to try and get some granulation. Very clearly all 3 of these wounds have some depth which is probably not  healthy granulation 10/2; patient was seen after HBO today. He had a new probing area above the wound on the right first metatarsal head that was draining purulent drainage. Also very concerning spreading erythema up his right foot bruising on the right medial lower leg that was tender. The area on the left first and left fifth metatarsal head looked unchanged but he had what looked to be a new area towards the base of the fourth fifth toe interface. His vital signs were stable prior to HBO however his blood sugar was over 300. He complained of pain in the leg but denied trauma. Culture I did last week's and [facility] showing Staph aureus but does not identify sensitivities. X-rays were done but there is no report. I gave him doxycycline starting a week ago today but it looks as though he did not get this right away however he is taking it now 10/30; after the patient was here last time we sent him to the hospital. He was admitted from 02/24/2023 through 02/28/2023. He was felt to have osteomyelitis and cellulitis. On 10/3 he had a partial right first ray amputation with application of antibiotic beads and a bone biopsy of the left foot bone with debridement of wounds on the left foot. He has been using mupirocin with gauze. He had a weightbearing heel contact shoe but he switched into his regular shoes diabetic shoes have been ordered.His bone biopsy showed chronic osteomyelitis. He is completed 2 weeks worth of Augmentin Everything is considerably better. He has a healed area on the right first met head remanent  with eschar but no open area. No open area on the left fifth toe although there is some bluish discoloration but no palpable tenderness or warmth. Scattered eschars but nothing looks serious here. Objective Constitutional Sitting or standing Blood Pressure is within target range for patient.. Pulse regular and within target range for patient.Marland Kitchen Respirations regular, non-labored and within target range.. Temperature is normal and within the target range for the patient.Marland Kitchen appears in no distress. Vitals Time Taken: 10:11 AM, Height: 73 in, Weight: 330 lbs, BMI: 43.5, Temperature: 98.5 F, Pulse: 87 bpm, Respiratory Rate: 18 breaths/min, Blood Pressure: 133/88 mmHg. General Notes: Wound exam; right foot everything is closed on the right here. Eschared area over the first metatarsal head but I see no opening or threatening opening. On the left foot the first and fifth metatarsal heads are closed. I see no open wounds Integumentary (Hair, Skin) Wound #10 status is Healed - Epithelialized. Original cause of wound was Shear/Friction. The date acquired was: 11/18/2022. The wound has been in treatment 18 weeks. The wound is located on the Left Amputation Site - T The wound measures 0cm length x 0cm width x 0cm depth; 0cm^2 area and 0cm^3 volume. oe. There is Fat Layer (Subcutaneous Tissue) exposed. There is a none present amount of drainage noted. There is no granulation within the wound bed. There is no necrotic tissue within the wound bed. Wound #12 status is Healed - Epithelialized. Original cause of wound was Gradually Appeared. The date acquired was: 02/21/2023. The wound has been in treatment 4 weeks. The wound is located on the Left Metatarsal head fourth. The wound measures 0cm length x 0cm width x 0cm depth; 0cm^2 area and 0cm^3 volume. There is Fat Layer (Subcutaneous Tissue) exposed. There is a none present amount of drainage noted. There is no granulation within the wound bed. There is no necrotic  tissue within the wound bed. Wound #9 status  is Healed - Epithelialized. Original cause of wound was Blister. The date acquired was: 10/07/2022. The wound has been in treatment 22 weeks. The wound is located on the Left,Lateral Metatarsal head fifth. The wound measures 0cm length x 0cm width x 0cm depth; 0cm^2 area and 0cm^3 volume. There is Fat Layer (Subcutaneous Tissue) exposed. There is a none present amount of drainage noted. There is no granulation within the wound bed. There is no necrotic tissue within the wound bed. Assessment Active Problems ICD-10 DONAVON, GERHOLD ([id number]) [phone].pdf Page 7 of 8 Other chronic osteomyelitis, left ankle and foot Type 2 diabetes mellitus with foot ulcer Non-pressure chronic ulcer of other part of left foot with other specified severity Non-pressure chronic ulcer of other part of left foot with fat layer exposed Non-pressure chronic ulcer of other part of right foot with fat layer exposed Type 2 diabetes mellitus with diabetic neuropathy, unspecified Plan Discharge From [facility] Services: Discharge from Wound Care Center Treatment Complete Additional Orders / Instructions: Vitamin A; Vitamin C, Zinc Other: - apply urea cream 40% or any lotion to feet/legs daily Hyperbaric Oxygen Therapy: Discontinue HBO Therapy Medications-Please add to medication list.: Topical Antibiotic - continue applying Mupirocin to areas per Dr. Excell Seltzer 1. I think the patient can be discharged from the clinic 2. He is at high risk but he has podiatry follow-up. The only area that concern me today was the left fifth metatarsal head but he sees Dr. Excell Seltzer next week to look at this area. Nothing appeared to be open the previous infected areas of all all been surgically removed or of closed. 3. I talked a bit about prevention with him including diabetic shoes, moisturizing his feet, wearing diabetic socks or protective socks. 4. I did not think he  needed to follow here if he is being followed by podiatry. He can call us as needed if he needs to see [name] Electronic Signature(s) Signed: 03/24/2023 4:47:46 PM By: Baltazar Najjar MD Entered By: Baltazar Najjar on 03/24/2023 10:55:38 -------------------------------------------------------------------------------- SuperBill Details Patient Name: Date of Service: [name] 03/24/2023 Medical Record Number: [id number] Patient Account Number: [id number] Date of Birth/Sex: Treating RN: Feb 15, 1981 (41 y.o. [name]) Jettie Pagan, [place] Primary Care Provider: [name] Other Clinician: Betha Loa Referring Provider: Treating Provider/Extender: Chauncey Mann, [name] in Treatment: 25 Diagnosis Coding ICD-10 Codes Code Description [phone] Other chronic osteomyelitis, left ankle and foot E11.621 Type 2 diabetes mellitus with foot ulcer 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rity L97.522 Non-pressure chronic ulcer of other part of left foot with fat layer exposed L97.512 Non-pressure chronic ulcer of other part of right foot with fat layer exposed E11.40 Type 2 diabetes mellitus with diabetic neuropathy, unspecified Facility Procedures Electronic Signature(s) Signed: 03/24/2023 4:47:46 PM By: Baltazar Najjar MD Signed: 03/24/2023 5:07:07 PM By: Betha Loa Entered By: Betha Loa on 03/24/2023 10:45:12

## 2023-03-25 NOTE — Patient Outreach (Signed)
Care Management  Transitions of Care Program Transitions of Care Post-discharge week 3   03/25/2023 Name: [name] MRN: [id number] DOB: 1980/11/20  Subjective: [name] is a 42 y.o. year old male who is a primary care patient of Marcine Matar, MD. The Care Management team Engaged with patient Engaged with patient by telephone to assess and address transitions of care needs.   Consent to Services:  Patient was given information about care management services, agreed to services, and gave verbal consent to participate.   Assessment:     Patient voices no new complaints Patient has not developed/ reported any new Medical issues / Dx or acute changes.- since last follow-up call for most recent  Hospital stay   10/2-10/6 / 2024  He was seen by wound center 10/30 NNO, area is healing well No s/s infection . Area is healed and OTA. He did not receive his Diabetic shoes. A new prescription needed to be called to the DME company, once approved b Insurance he will be able to go pick out a shoe.  He is looking forward to a Behavioral Health appt 11/4. Thus will be a new Provider for him. He sees Podiatrist next week and has a PCP appt 11/12  Patient educated on red flags s/s to watch for and was encouraged to report any of these identified , any new symptoms , changes in baseline or  medication regimen,  change in health status  /  well-being, or safety concerns to PCP and / or the  [facility] Case Management team .        SDOH Interventions    Flowsheet Row Telephone from 03/25/2023 in [place] [facility]PARTMENT Care Coordination from 03/12/2023 in [facility] Coordination Telephone from 03/09/2023 in [place] POPULATION HEALTH DEPARTMENT Telephone from 03/02/2023 in [place] [facility] Office Visit from 12/31/2022 in [facility] Office Visit from 01/30/2021 in [facility]  SDOH Interventions        Food Insecurity Interventions AMB Referral, Other (Comment)  [Has contact info for [employer] resources, Advertising copywriter and website to apply for fois stamps] Other (Comment)  [Provided a list of food pantry location, [employer] Dietitian, FNS application] Other (Comment)  [Referral to SW, Food Pantries] -- -- --  Housing Interventions -- Intervention Not Indicated -- -- -- --  Transportation Interventions -- Intervention Not Indicated -- -- -- --  Depression Interventions/Treatment  -- -- -- -- Medication, Referral to Psychiatry Currently on Treatment  Physical Activity Interventions -- -- -- Patient Declined  [decreased activity due ro wounds Bilateral feet and recent toe amputations ( Left and Right foot)] -- --  Health Literacy Interventions -- -- -- Intervention Not Indicated -- --        Goals Addressed             This Visit's Progress    TOC Care Plan       Current Barriers:  Knowledge Deficits related to plan of care for management of recent amputation of 2 toes, wound care and preventative Diabetic foot care   Chronic Disease Management support and education needs related to DM   RNCM Clinical Goal(s):  Patient will work with the Care Management team over the next 30 days to address Transition of Care Barriers: Medication Management Provider appointments take all medications exactly as prescribed and will call provider for medication  related questions as evidenced by no missed medications , medications are  refilled timely and he has no adverse side effects  attend all scheduled medical appointments: with PCP, Surgeon/ Podiatry and Wound Care Center  as evidenced by no missed appointments and follow-up appt recommendations are scheduled   through collaboration with RN Care manager, provider, and care team.   Interventions: Evaluation of current treatment plan related to  self management and patient's adherence  to plan as established by provider  Transitions of Care:  Goal on track:  Yes. Doctor Visits  - discussed the importance of doctor visits Referral to SW for assistance securing food resources- [facility] He has contact info for [employer] app, Website to apply for food stamps, and local food pantry     Patient Goals/Self-Care Activities: Participate in Transition of Care Program/Attend TOC scheduled calls Take all medications as prescribed Attend all scheduled provider appointments Call pharmacy for medication refills 3-7 days in advance of running out of medications Perform all self care activities independently  Perform IADL's (shopping, preparing meals, housekeeping, managing finances) independently Call provider office for new concerns or questions   Follow Up Plan:  Telephone follow up appointment with care management team member scheduled for:  04/01/23 10:00 am  The patient has been provided with contact information for the care management team and has been advised to call with any health related questions or concerns.  Surgical follow-up  completed routine follow-up wound center visits resuming( 10/30) ,still following up with  surgeon next visit next week PCP 11/12. Behavioral Health 11/4            Plan: The patient has been provided with contact information for the care management team and has been advised to call with any health related questions or concerns.  Routine follow-up and on-going assessment evaluation and education of disease processes, recommended interventions for both chronic and acute medical conditions , will occur during each weekly visit along with ongoing review of symptoms ,medication reviews and reconciliation. Any updates , inconsistencies, discrepancies or acute care concerns will be addressed and routed to the correct Practitioner if indicated   Please refer to Care Plan for goals and interventions -Effectiveness of  interventions, symptom management and outcomes will be evaluated  weekly during [facility] 30-day Program Outreach calls  . Any necessary  changes and updates to Care Plan will be completed episodically    Reviewed goals for care  Patient provided with Contact information and verbalized understanding with current POC.   Susa Loffler , BSN, RN Care Management Coordinator [place]   [facility] [email or web address] Direct Dial: [phone]

## 2023-03-29 ENCOUNTER — Ambulatory Visit (HOSPITAL_BASED_OUTPATIENT_CLINIC_OR_DEPARTMENT_OTHER): Payer: 59 | Admitting: Family

## 2023-03-29 ENCOUNTER — Other Ambulatory Visit: Payer: Self-pay

## 2023-03-29 ENCOUNTER — Other Ambulatory Visit (HOSPITAL_COMMUNITY): Payer: Self-pay

## 2023-03-29 ENCOUNTER — Encounter (HOSPITAL_COMMUNITY): Payer: Self-pay | Admitting: Family

## 2023-03-29 VITALS — BP 144/97 | HR 98 | Ht 73.0 in | Wt 317.0 lb

## 2023-03-29 DIAGNOSIS — F4323 Adjustment disorder with mixed anxiety and depressed mood: Secondary | ICD-10-CM | POA: Diagnosis not present

## 2023-03-29 DIAGNOSIS — F411 Generalized anxiety disorder: Secondary | ICD-10-CM | POA: Diagnosis not present

## 2023-03-29 DIAGNOSIS — F331 Major depressive disorder, recurrent, moderate: Secondary | ICD-10-CM

## 2023-03-29 DIAGNOSIS — F900 Attention-deficit hyperactivity disorder, predominantly inattentive type: Secondary | ICD-10-CM | POA: Diagnosis not present

## 2023-03-29 MED ORDER — SERTRALINE HCL 50 MG PO TABS
50.0000 mg | ORAL_TABLET | Freq: Every day | ORAL | 3 refills | Status: DC
  Filled 2023-03-29 (×2): qty 30, 30d supply, fill #0

## 2023-03-29 MED ORDER — ATOMOXETINE HCL 40 MG PO CAPS
40.0000 mg | ORAL_CAPSULE | Freq: Every day | ORAL | 0 refills | Status: AC
Start: 2023-03-29 — End: ?
  Filled 2023-03-29 (×2): qty 30, 30d supply, fill #0

## 2023-03-29 NOTE — Progress Notes (Signed)
Psychiatric Initial Adult Assessment   Patient Identification: [name] MRN:  [id number] Date of Evaluation:  03/29/2023 Referral Source: [facility] Chief Complaint: [name] reported he has been off medication for the past 6 months to a year  Visit Diagnosis:    ICD-10-CM   1. Adjustment disorder with mixed anxiety and depressed mood  F43.23     2. GAD (generalized anxiety disorder)  F41.1     3. Attention deficit hyperactivity disorder (ADHD), predominantly inattentive type  F90.0     4. Major depressive disorder, recurrent episode, moderate (HCC)  F33.1 sertraline (ZOLOFT) 50 MG tablet      History of Present Illness:  [name] is a 62 Caucasian male presents to reestablish care.  He reports he carries a diagnosis with major depression disorder, generalized anxiety disorder, bipolar 1 disorder and attention deficit disorder.  Reports he was prescribed Zoloft 50 mg and Strattera  80 mg for mood stabilization however has not had his medication in quite some time.  He reports he was followed by multiple providers at [facility] urgent care facility however, has not been able to reestablish care since he has not followed up with his last appointments.  He reports struggling with mood instability, increased depression, poor concentration, irritability and mood swings.  States he is doing fairly well with medications.   He denied history related to alcohol or illicit substance use.  Denied previous inpatient admissions.  Denied alcohol abuse/misuse.  GAD-7 14 PHQ-9 10.  Denied thoughts of suicidal or homicidal ideations. [name] reported family history related to mental illness: undiagnosed.  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 He reported yelling at the owners son and  was fired due to his explosive temper.  Major depressive disorder: Generalized anxiety disorder: Attention deficit disorder:  Will restart  Zoloft 50 mg daily Restart Strattera 40 mg daily, education was provided with titrating medication from 20 to 40 mg however he declined adamant about restarting 80 mg about once.   During evaluation [name] is sitting; he is alert/oriented x 4; calm/cooperative; and mood congruent with affect.  Patient is speaking in a clear tone at moderate volume, and normal pace; with good eye contact. His  thought process is coherent and relevant; There is no indication that he is currently responding to internal/external stimuli or experiencing delusional thought content.  Patient denies suicidal/self-harm/homicidal ideation, psychosis, and paranoia.  Patient has remained calm throughout assessment and has answered questions appropriately.   Associated Signs/Symptoms: Depression Symptoms:  depressed mood, feelings of worthlessness/guilt, difficulty concentrating, anxiety, (Hypo) Manic Symptoms:  Distractibility, Labiality of Mood, Anxiety Symptoms:  Excessive Worry, Psychotic Symptoms:  Hallucinations: None PTSD Symptoms: NA  Past Psychiatric History:   Previous Psychotropic Medications: Yes   Substance Abuse History in the last 12 months:  No.  Consequences of Substance Abuse: NA  Past Medical History:  Past Medical History:  Diagnosis Date   ADHD    Anxiety    Bipolar disorder (HCC)    Depression    Diabetes mellitus without complication (HCC)    type 2   Heart murmur    as a child   HLD (hyperlipidemia)    Hypertension    no meds   Peripheral vascular disease (HCC)     Past Surgical History:  Procedure Laterality Date   AMPUTATION Left 03/06/2020   Procedure: LEFT GREAT TOE AMPUTATION;  Surgeon: [name], MD;  Location: [facility] OR;  Service: Orthopedics;  Laterality: Left;   AMPUTATION Right 10/15/2021   Procedure: RIGHT GREAT TOE AMPUTATION;  Surgeon: [name], MD;  Location: [facility] OR;  Service: Orthopedics;  Laterality: Right;   BONE BIOPSY Left 02/25/2023    Procedure: LEFT FIFTH METATARSAL BONE BIOPSY;  Surgeon: Rosetta Posner, DPM;  Location: [facility] ORS;  Service: Podiatry;  Laterality: Left;   EYE SURGERY Bilateral    lasik   TRANSMETATARSAL AMPUTATION Right 02/25/2023   Procedure: RIGHT PARTIAL FIRST RAY AMPUTATION WITH ANTIBIOTIC BEAD APPLICATION;  Surgeon: Rosetta Posner, DPM;  Location: [facility] ORS;  Service: Podiatry;  Laterality: Right;   WISDOM TOOTH EXTRACTION      Family Psychiatric History:  as documented 6/23 "Mother depression, father depression, brother depression, maternal uncles alcohol dependence."  Family History:  Family History  Problem Relation Age of Onset   Diabetes Mother    Hyperlipidemia Father    Hypertension Father     Social History:   Social History   Socioeconomic History   Marital status: Single    Spouse name: Not on file   Number of children: Not on file   Years of education: Not on file   Highest education level: Not on file  Occupational History   Occupation: Unemployed  Tobacco Use   Smoking status: Never   Smokeless tobacco: Never  Vaping Use   Vaping status: Never Used  Substance and Sexual Activity   Alcohol use: Not Currently    Comment: last drink 2018   Drug use: Never   Sexual activity: Not Currently  Other Topics Concern   Not on file  Social History Narrative   Live with parents in [place].    Social Determinants of Health   Financial Resource Strain: Not on file  Food Insecurity: Food Insecurity Present (03/25/2023)   Hunger Vital Sign    Worried About Running Out of Food in the Last Year: Sometimes true    Ran Out of Food in the Last Year: Sometimes true  Transportation Needs: No Transportation Needs (03/12/2023)   PRAPARE - Administrator, Civil Service (Medical): No    Lack of Transportation (Non-Medical): No  Physical Activity: Inactive (03/02/2023)   Exercise Vital Sign    Days of Exercise per Week: 0 days    Minutes of Exercise per Session: 0 min   Stress: Not on file  Social Connections: Not on file    Additional Social History:   Allergies:   Allergies  Allergen Reactions   Milk-Related Compounds Diarrhea    Metabolic Disorder Labs: Lab Results  Component Value Date   HGBA1C 9.6 (H) 02/24/2023   MPG 228.82 02/24/2023   MPG 291.96 03/03/2020   No results found for: "PROLACTIN" Lab Results  Component Value Date   CHOL 212 (H) 12/31/2022   TRIG 216 (H) 12/31/2022   HDL 35 (L) 12/31/2022   CHOLHDL 6.1 (H) 12/31/2022   LDLCALC 138 (H) 12/31/2022   LDLCALC 107 (H) 06/05/2021   No results found for: "TSH"  Therapeutic Level Labs: No results found for: "LITHIUM" No results found for: "CBMZ" No results found for: "VALPROATE"  Current Medications: Current Outpatient Medications  Medication Sig Dispense Refill   atomoxetine (STRATTERA) 40 MG capsule Take 1 capsule (40 mg total) by mouth daily. 30 capsule 0   atomoxetine (STRATTERA) 80 MG capsule Take 1 capsule (80 mg total) by mouth daily. 30 capsule 0   blood glucose meter kit and supplies Dispense based on patient and insurance preference. Use  up to four times daily as directed. (FOR ICD-10 E10.9, E11.9). 1 each 0   insulin aspart (NOVOLOG) 100 UNIT/ML FlexPen Inject 5 Units into the skin 3 (three) times daily with meals. 15 mL 0   Insulin Glargine (BASAGLAR KWIKPEN) 100 UNIT/ML Inject 60 Units into the skin at bedtime. 15 mL 6   Insulin Pen Needle (PEN NEEDLES) 31G X 8 MM MISC UAD 100 each 6   metFORMIN (GLUCOPHAGE-XR) 500 MG 24 hr tablet Take 4 tablets (2,000 mg total) by mouth daily with breakfast. 360 tablet 1   mupirocin ointment (BACTROBAN) 2 % Apply 1 Application topically daily for 7 days. 22 g 1   pravastatin (PRAVACHOL) 40 MG tablet Take 1 tablet (40 mg total) by mouth once daily. 90 tablet 1   acetaminophen (TYLENOL) 325 MG tablet Take 2 tablets (650 mg total) by mouth every 6 (six) hours as needed for mild pain or moderate pain (or Fever >/= 101). (Patient  not taking: Reported on 03/02/2023)     acidophilus (RISAQUAD) CAPS capsule Take 1 capsule by mouth 3 (three) times daily. (Patient not taking: Reported on 03/29/2023) 90 capsule 0   oxyCODONE (OXY IR/ROXICODONE) 5 MG immediate release tablet Take 1 tablet (5 mg total) by mouth every 4 (four) hours as needed for moderate pain or severe pain. (Patient not taking: Reported on 03/29/2023) 30 tablet 0   senna-docusate (SENOKOT-S) 8.6-50 MG tablet Take 1 tablet by mouth at bedtime as needed for mild constipation. (Patient not taking: Reported on 03/02/2023)     sertraline (ZOLOFT) 50 MG tablet Take 1 tablet (50 mg total) by mouth daily. 30 tablet 3   No current facility-administered medications for this visit.    Musculoskeletal: Strength & Muscle Tone: within normal limits Gait & Station: normal Patient leans: N/A  Psychiatric Specialty Exam: Review of Systems  Psychiatric/Behavioral:  Positive for decreased concentration and sleep disturbance. The patient is nervous/anxious.   All other systems reviewed and are negative.   Blood pressure (!) 144/97, pulse 98, height 6\' 1"  (1.854 m), weight (!) 317 lb (143.8 kg).Body mass index is 41.82 kg/m.  General Appearance: Casual  Eye Contact:  Good  Speech:  Clear and Coherent  Volume:  Normal  Mood:  Anxious and Depressed  Affect:  Congruent  Thought Process:  Coherent  Orientation:  Full (Time, Place, and Person)  Thought Content:  Logical  Suicidal Thoughts:  No  Homicidal Thoughts:  No  Memory:  Immediate;   Fair Recent;   Fair  Judgement:  Good  Insight:  Good  Psychomotor Activity:  Normal  Concentration:  Concentration: Good  Recall:  Good  Fund of Knowledge:Good  Language: Good  Akathisia:  No  Handed:  Right  AIMS (if indicated):  not done  Assets:  Communication Skills Desire for Improvement Resilience Social Support  ADL's:  Intact  Cognition: WNL  Sleep:  Fair   Screenings: GAD-7    Garment/textile technologist Visit from  12/31/2022 in [facility] [place] - [facility] Dept Of [place]. [facility] Office Visit from 04/28/2022 in [facility] [place] - A Dept Of [place]a Bridegroom. [facility] Video Visit from 01/23/2022 in [facility] Office Visit from 12/23/2021 in [facility] [place] - A Dept Of [place]. [facility] Video Visit from 10/28/2021 in [facility]  Total GAD-7 Score 7 7 4 7 2       [phone]  Flowsheet Row Office Visit from 12/31/2022 in [facility] [place] - A Dept Of [place]. [facility] Office Visit from 04/28/2022 in [facility] [place] - A Dept Of Eligha Bridegroom. [facility] Video Visit from 01/23/2022 in [facility] Office Visit from 12/23/2021 in [facility] [place] - A Dept Of [place]. [facility] Video Visit from 10/28/2021 in [name]  PHQ-2 Total Score 3 3 2 2 1   PHQ-9 Total Score 12 10 8 8 7       Flowsheet Row ED to Hosp-Admission (Discharged) from 02/24/2023 in [facility]A) Video Visit from 10/28/2021 in [facility] Admission (Discharged) from 10/15/2021 in [place] PERIOPERATIVE AREA  C-SSRS RISK CATEGORY No Risk No Risk No Risk       Assessment and Plan: Delon Revelo is a 25 Caucasian male that presents to reestablish care.  She reports a history of bipolar disorder, major depressive disorder, generalized anxiety disorder.  States he has been off his medications for roughly 6 months to a year.  States he was prescribed Zoloft and Strattera which he felt his mood was stabilized to the best when taking medications as indicated.  Denied previous inpatient admissions.  Denied suicidal or homicidal ideations.  Denied auditory or visual hallucinations.  Denied any hypo or hyper mania symptoms.  Restarted Zoloft 50 mg  daily Discussed titration with Strattera 20 mg  to 40mg  to 80 mg -Follow-up 6 weeks for medication adherence/tolerability  Collaboration of Care: Medication Management AEB amantadine and sertraline  Patient/Guardian was advised Release of Information must be obtained prior to any record release in order to collaborate their care with an outside provider. Patient/Guardian was advised if they have not already done so to contact the registration department to sign all necessary forms in order for [name] to release information regarding their care.   Consent: Patient/Guardian gives verbal consent for treatment and assignment of benefits for services provided during this visit. Patient/Guardian expressed understanding and agreed to proceed.   Oneta Rack, NP 11/4/20242:53 PM

## 2023-03-30 ENCOUNTER — Other Ambulatory Visit (HOSPITAL_COMMUNITY): Payer: Self-pay

## 2023-04-01 ENCOUNTER — Other Ambulatory Visit: Payer: Self-pay

## 2023-04-01 NOTE — Patient Outreach (Signed)
Care Management  Transitions of Care Program Transitions of Care Post-discharge week 5 Program complete   04/01/2023 Name: [name] MRN: [id number] DOB: 08-18-1980  Subjective: [name] is a 42 y.o. year old male who is a primary care patient of Marcine Matar, MD. The Care Management team Engaged with patient Engaged with patient by telephone to assess and address transitions of care needs.   Consent to Services:  Patient was given information about care management services, agreed to services, and gave verbal consent to participate.   Assessment:   Patient voices no new complaints Patient has not developed/ reported any new Medical issues / Dx or acute changes.- since last follow-up call for most recent  Hospital stay   10/2-10/6 / 2024  His surgical wounds are healed He has routine follow-up w/ Dr Baker(Podiatry).He is planning on picking up Diabetic shoes within next 2 weeks  He no longer needs to be followed by Wound Center. He was [facility] by [facility] 11/4. He is resuming anti-depressants. He stated he feels great. He had no concerns or questions He did not  look into Food Pantry but stated he had adequate food sources at this time.  Patient educated on red flags s/s to watch for and was encouraged to report any of these identified , any new symptoms , changes in baseline or  medication regimen,  change in health status  /  well-being, or safety concerns to PCP and / or the  [facility] Case Management team .          SDOH Interventions    Flowsheet Row Patient Outreach from 04/01/2023 in [place] POPULATION HEALTH DEPARTMENT Office Visit from [facility]/2023 in [facility] Telephone from 03/25/2023 in [name] POPULATION HEALTH DEPARTMENT Care Coordination from 03/12/2023 in [facility] Community Care Coordination Telephone from 03/09/2023 in [place] POPULATION HEALTH DEPARTMENT Telephone from 03/02/2023 in  [place] POPULATION HEALTH DEPARTMENT  SDOH Interventions        Food Insecurity Interventions -- -- AMB Referral, Other (Comment)  [Has contact info for [employer] resources, Advertising copywriter and website to apply for fois stamps] Other (Comment)  [Provided a Horticulturist, commercial of food pantry location, Greater Dietitian, FNS application] Other (Comment)  [Referral to SW, Food Pantries] --  Housing Interventions Intervention Not Indicated -- -- Intervention Not Indicated -- --  Transportation Interventions Intervention Not Indicated, Patient Resources (Friends/Family), AMB Referral  [A referal to SW previously submitted] -- -- Intervention Not Indicated -- --  Utilities Interventions Intervention Not Indicated -- -- -- -- --  Depression Interventions/Treatment  -- Medication -- -- -- --  Physical Activity Interventions -- -- -- -- -- Patient Declined  [decreased activity due ro wounds Bilateral feet and recent toe amputations ( Left and Right foot)]  Health Literacy Interventions -- -- -- -- -- Intervention Not Indicated        Goals Addressed             This Visit's Progress    COMPLETED: TOC Care Plan       Current Barriers:  Knowledge Deficits related to plan of care for management of recent amputation of 2 toes, wound care and preventative Diabetic foot care   Chronic Disease Management support and education needs related to DM   RNCM Clinical Goal(s):  Patient will work with the Care Management team over the next 30 days to address Transition of Care Barriers: Medication Management Provider appointments  take all medications exactly as prescribed and will call provider for medication related questions as evidenced by no missed medications , medications are  refilled timely and he has no adverse side effects  attend all scheduled medical appointments: with PCP, Surgeon/ Podiatry and Wound Care Center  as evidenced by no missed appointments and follow-up appt recommendations are  scheduled   through collaboration with RN Care manager, provider, and care team.   Interventions: Evaluation of current treatment plan related to  self management and patient's adherence to plan as established by provider  Transitions of Care:  Goal Met. Doctor Visits  - discussed the importance of doctor visits Referral to SW for assistance securing food resources- [facility] He has contact info for [employer] app, Website to apply for food stamps, and local food pantry     Patient Goals/Self-Care Activities: Participate in Transition of Care Program/Attend TOC scheduled calls Take all medications as prescribed Attend all scheduled provider appointments Call pharmacy for medication refills 3-7 days in advance of running out of medications Perform all self care activities independently  Perform IADL's (shopping, preparing meals, housekeeping, managing finances) independently Call provider office for new concerns or questions   Follow Up Plan:  Telephone follow up appointment with care management team member scheduled for:  04/01/23 10:00 am -completed The patient has been provided with contact information for the care management team and has been advised to call with any health related questions or concerns.  Surgical follow-up  completed routine follow-up wound center visits resuming( 10/30) ,still following up with  surgeon next visit next week PCP 11/12. Behavioral Health 11/4- all completed             Plan:   The patient has successfully completed the 30 day TOC Program. His conditions is stable  No further acute needs identified at this time. Chronic conditions are managed thru  PCP,  additional Healthcare Providers and Specialists. SDOH needs have been screened and interventions provided if indicated. Reviewed current home medications -- provided education as needed.  Discussed rationale of use , how/when to take, Patient  is aware of potential side  effects, and was encouraged to notify PCP for any changes in condition, or signs / symptoms not relieved  with interventions.  Patient verbalized understanding of ongoing  plan of care. All resident needs met at this time. The patient has been provided with contact information for the care management team and has been advised to call with any health related questions or concerns.   Susa Loffler , BSN, RN Care Management Coordinator [place]   [facility] [email or web address] Direct Dial: [phone]

## 2023-04-06 ENCOUNTER — Other Ambulatory Visit: Payer: Self-pay

## 2023-04-06 ENCOUNTER — Encounter: Payer: Self-pay | Admitting: Internal Medicine

## 2023-04-06 ENCOUNTER — Ambulatory Visit: Payer: 59 | Attending: Internal Medicine | Admitting: Internal Medicine

## 2023-04-06 DIAGNOSIS — E1169 Type 2 diabetes mellitus with other specified complication: Secondary | ICD-10-CM

## 2023-04-06 DIAGNOSIS — E785 Hyperlipidemia, unspecified: Secondary | ICD-10-CM

## 2023-04-06 DIAGNOSIS — Z7984 Long term (current) use of oral hypoglycemic drugs: Secondary | ICD-10-CM

## 2023-04-06 DIAGNOSIS — F331 Major depressive disorder, recurrent, moderate: Secondary | ICD-10-CM

## 2023-04-06 DIAGNOSIS — Z794 Long term (current) use of insulin: Secondary | ICD-10-CM

## 2023-04-06 MED ORDER — DEXCOM G7 RECEIVER DEVI
0 refills | Status: DC
  Filled 2023-04-06: qty 1, fill #0
  Filled 2023-05-12 (×2): qty 1, 1d supply, fill #0

## 2023-04-06 MED ORDER — DEXCOM G7 SENSOR MISC
6 refills | Status: DC
  Filled 2023-04-06: qty 3, fill #0
  Filled 2023-05-12 (×2): qty 3, 30d supply, fill #0
  Filled 2023-07-21: qty 3, 30d supply, fill #1

## 2023-04-06 NOTE — Patient Instructions (Signed)
Continue taking metformin, glargine insulin and NovoLog as prescribed.  I have sent a prescription to your pharmacy for the continuous glucose monitor.  Once you get it filled, asked the pharmacist to show you how to set it up.

## 2023-04-06 NOTE — Progress Notes (Signed)
Patient ID: [name], male    DOB: [birth year]  MRN: [id number]  CC: Hypertension (HTN f/u. /No questions / concerns/No to flu vax.)   Subjective: [name] is a 42 y.o. male who presents for chronic ds management. His concerns today include:  Pt with hx of DM type 2, HL, HTN (off meds x 15 yrs) obesity, DM foot ulcers, amputation LT big toe (02/2020), MDD, obesity, RT big toe amputation 2023   Since last visit he was hosp with osteomyolitis LT foot.  Amputation of part of the right first toe and biopsy of the left fifth toe.  Placed on antibiotics which she has completed.  Lesions on both feet healed  DM: Lab Results  Component Value Date   HGBA1C 9.6 (H) 02/24/2023  A1C last mth had improved from 11.9 from last visit in August Should be on Trulicity 0.75 mg, Glargine 30 units daily and Metformin XR 500mg  4 tabs daily.  Unable to afford the Trulicity; copay $900.  Novlog pen added at hosp dischg.   -Was not taking his medications consistently due to  depression/mental health deterioration.  Restarted his psychiatric medicines 1 week ago.  He subsequently restarted his medications.  Taking glargine 40 units daily, NovoLog 5 units with meals and metformin   -checks BS but not consistently -would like rxn for CGM now that he completed barometric chamber treatments for his foot Admits that his eating habits have not been the best.  Him is not having Trulicity to decrease appetite and cravings.  Lives with his father who does the shopping.  States that his father purchases a lot of junk foods/snacks. -Just restarted Pravachol Referred for eye exam.  No call  MDD: Saw his behavioral health provider on the fourth of this month.  Restarted on medications.  He is on Strattera, Zoloft  Patient Active Problem List   Diagnosis Date Noted   Diabetic foot infection (HCC) 02/26/2023   MDD (major depressive disorder), recurrent, in partial remission (HCC) 11/19/2021    Osteomyelitis (HCC)    Bipolar depression (HCC) 09/10/2021   Adjustment disorder with mixed anxiety and depressed mood 09/10/2021   Attention deficit hyperactivity disorder (ADHD), predominantly inattentive type 07/30/2020   Anxiety with depression 06/07/2020   Hyperlipidemia due to type 2 diabetes mellitus (HCC) 03/27/2020   Status post amputation of left great toe (HCC) 03/26/2020   Influenza vaccination declined 03/26/2020   23-polyvalent pneumococcal polysaccharide vaccine declined 03/26/2020   COVID-19 vaccine series completed 03/26/2020   Moderate major depression, single episode (HCC) 03/26/2020   Anaerobic bacteremia 03/07/2020   Major depressive disorder, recurrent episode, moderate (HCC) 03/04/2020   Abscess of great toe, left    Sepsis due to group B Streptococcus with acute renal failure (HCC)    Cellulitis of left toe 03/03/2020   Diabetic ketoacidosis (HCC) 03/03/2020   DKA (diabetic ketoacidosis) (HCC) 03/03/2020   Ulcer of right foot with fat layer exposed (HCC) 06/06/2019   Non-pressure chronic ulcer of other part of left foot limited to breakdown of skin (HCC) 06/06/2019   Uncontrolled type 2 diabetes mellitus with hyperglycemia (HCC) 05/02/2018   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) 07/30/2017   Venous insufficiency of both lower extremities 07/30/2017   Obesity, Class III, BMI 40-49.9 (morbid obesity) (HCC) 07/30/2017   Essential hypertension 09/03/2016     Current Outpatient Medications on File Prior to Visit  Medication Sig Dispense Refill   atomoxetine (STRATTERA) 40 MG capsule Take 1 capsule (40 mg  total) by mouth daily. 30 capsule 0   atomoxetine (STRATTERA) 80 MG capsule Take 1 capsule (80 mg total) by mouth daily. 30 capsule 0   blood glucose meter kit and supplies Dispense based on patient and insurance preference. Use up to four times daily as directed. (FOR ICD-10 E10.9, E11.9). 1 each 0   insulin aspart (NOVOLOG) 100 UNIT/ML FlexPen Inject 5 Units into  the skin 3 (three) times daily with meals. 15 mL 0   Insulin Glargine (BASAGLAR KWIKPEN) 100 UNIT/ML Inject 60 Units into the skin at bedtime. 15 mL 6   Insulin Pen Needle (PEN NEEDLES) 31G X 8 MM MISC UAD 100 each 6   metFORMIN (GLUCOPHAGE-XR) 500 MG 24 hr tablet Take 4 tablets (2,000 mg total) by mouth daily with breakfast. 360 tablet 1   mupirocin ointment (BACTROBAN) 2 % Apply 1 Application topically daily for 7 days. 22 g 1   pravastatin (PRAVACHOL) 40 MG tablet Take 1 tablet (40 mg total) by mouth once daily. 90 tablet 1   sertraline (ZOLOFT) 50 MG tablet Take 1 tablet (50 mg total) by mouth daily. 30 tablet 3   acetaminophen (TYLENOL) 325 MG tablet Take 2 tablets (650 mg total) by mouth every 6 (six) hours as needed for mild pain or moderate pain (or Fever >/= 101). (Patient not taking: Reported on 04/06/2023)     acidophilus (RISAQUAD) CAPS capsule Take 1 capsule by mouth 3 (three) times daily. (Patient not taking: Reported on 04/06/2023) 90 capsule 0   oxyCODONE (OXY IR/ROXICODONE) 5 MG immediate release tablet Take 1 tablet (5 mg total) by mouth every 4 (four) hours as needed for moderate pain or severe pain. (Patient not taking: Reported on 04/06/2023) 30 tablet 0   senna-docusate (SENOKOT-S) 8.6-50 MG tablet Take 1 tablet by mouth at bedtime as needed for mild constipation. (Patient not taking: Reported on 04/06/2023)     No current facility-administered medications on file prior to visit.    Allergies  Allergen Reactions   Milk-Related Compounds Diarrhea    Social History   Socioeconomic History   Marital status: Single    Spouse name: Not on file   Number of children: Not on file   Years of education: Not on file   Highest education level: Not on file  Occupational History   Occupation: Unemployed  Tobacco Use   Smoking status: Never   Smokeless tobacco: Never  Vaping Use   Vaping status: Never Used  Substance and Sexual Activity   Alcohol use: Not Currently     Comment: last drink 2018   Drug use: Never   Sexual activity: Not Currently  Other Topics Concern   Not on file  Social History Narrative   Live with parents in [place].    Social Determinants of Health   Financial Resource Strain: High Risk (04/06/2023)   Overall Financial Resource Strain (CARDIA)    Difficulty of Paying Living Expenses: Very hard  Food Insecurity: Food Insecurity Present (04/06/2023)   Hunger Vital Sign    Worried About Running Out of Food in the Last Year: Sometimes true    Ran Out of Food in the Last Year: Never true  Transportation Needs: Unmet Transportation Needs (04/06/2023)   PRAPARE - Administrator, Civil Service (Medical): Yes    Lack of Transportation (Non-Medical): No  Physical Activity: Inactive (04/06/2023)   Exercise Vital Sign    Days of Exercise per Week: 0 days    Minutes of Exercise per Session:  0 min  Stress: Stress Concern Present (04/06/2023)   [employer] of Occupational Health - Occupational Stress Questionnaire    Feeling of Stress : Very much  Social Connections: Socially Isolated (04/06/2023)   Social Connection and Isolation Panel [NHANES]    Frequency of Communication with Friends and Family: More than three times a week    Frequency of Social Gatherings with Friends and Family: More than three times a week    Attends Religious Services: Never    Database administrator or Organizations: No    Attends Banker Meetings: Never    Marital Status: Never married  Intimate Partner Violence: Not At Risk (04/06/2023)   Humiliation, Afraid, Rape, and Kick questionnaire    Fear of Current or Ex-Partner: No    Emotionally Abused: No    Physically Abused: No    Sexually Abused: No    Family History  Problem Relation Age of Onset   Diabetes Mother    Hyperlipidemia Father    Hypertension Father     Past Surgical History:  Procedure Laterality Date   AMPUTATION Left 03/06/2020   Procedure: LEFT  GREAT TOE AMPUTATION;  Surgeon: [name], MD;  Location: [facility] OR;  Service: Orthopedics;  Laterality: Left;   AMPUTATION Right 10/15/2021   Procedure: RIGHT GREAT TOE AMPUTATION;  Surgeon: [name], MD;  Location: [facility] OR;  Service: Orthopedics;  Laterality: Right;   BONE BIOPSY Left 02/25/2023   Procedure: LEFT FIFTH METATARSAL BONE BIOPSY;  Surgeon: Rosetta Posner, DPM;  Location: [facility] ORS;  Service: Podiatry;  Laterality: Left;   EYE SURGERY Bilateral    lasik   TRANSMETATARSAL AMPUTATION Right 02/25/2023   Procedure: RIGHT PARTIAL FIRST RAY AMPUTATION WITH ANTIBIOTIC BEAD APPLICATION;  Surgeon: Rosetta Posner, DPM;  Location: [facility] ORS;  Service: Podiatry;  Laterality: Right;   WISDOM TOOTH EXTRACTION      ROS: Review of Systems Negative except as stated above  PHYSICAL EXAM: BP 122/82 (BP Location: Left Arm, Patient Position: Sitting, Cuff Size: Large)   Pulse 95   Temp 98.1 F (36.7 C) (Oral)   Ht 6\' 1"  (1.854 m)   Wt (!) 316 lb (143.3 kg)   SpO2 99%   BMI 41.69 kg/m   Physical Exam   General appearance - alert, well appearing, Morbidly obese middle-age Caucasian male and in no distress Mental status - normal mood, behavior, speech, dress, motor activity, and thought processes Chest - clear to auscultation, no wheezes, rales or rhonchi, symmetric air entry Heart - normal rate, regular rhythm, normal S1, S2, no murmurs, rubs, clicks or gallops Extremities - peripheral pulses normal, no pedal edema, no clubbing or cyanosis Inspection of both feet shows that he currently does not have any ulcers.    04/06/2023    1:43 PM 03/29/2023    2:55 PM 12/31/2022    2:20 PM  Depression screen PHQ 2/9  Decreased Interest 1 2 2   Down, Depressed, Hopeless 1 2 1   PHQ - 2 Score 2 4 3   Altered sleeping 2 3 3   Tired, decreased energy 2 3 2   Change in appetite 2 2 2   Feeling bad or failure about yourself  0 1 1  Trouble concentrating 0 1 1  Moving slowly or fidgety/restless 0 0 0   Suicidal thoughts 0 0 0  PHQ-9 Score 8 14 12   Difficult doing work/chores Somewhat difficult Very difficult       04/06/2023    1:43 PM 12/31/2022  2:20 PM 04/28/2022   11:22 AM 01/23/2022   10:01 AM  GAD 7 : Generalized Anxiety Score  Nervous, Anxious, on Edge 1 1 2 1   Control/stop worrying 0 1 1 1   Worry too much - different things 0 0 1 0  Trouble relaxing 1 1 1  0  Restless 0 1 0 0  Easily annoyed or irritable 2 3 2 2   Afraid - awful might happen 0 0 0 0  Total GAD 7 Score 4 7 7 4   Anxiety Difficulty Somewhat difficult   Not difficult at all        Latest Ref Rng & Units 02/28/2023    4:40 AM 02/25/2023    3:25 AM 02/24/2023   12:44 PM  CMP  Glucose 70 - 99 mg/dL  350  093   BUN 6 - 20 mg/dL  13  14   Creatinine 8.18 - 1.24 mg/dL 2.99  3.71  6.96   Sodium 135 - 145 mmol/L  136  134   Potassium 3.5 - 5.1 mmol/L  4.2  4.1   Chloride 98 - 111 mmol/L  104  97   CO2 22 - 32 mmol/L  25  25   Calcium 8.9 - 10.3 mg/dL  8.5  9.2   Total Protein 6.5 - 8.1 g/dL   9.2   Total Bilirubin 0.3 - 1.2 mg/dL   1.7   Alkaline Phos 38 - 126 U/L   95   AST 15 - 41 U/L   13   ALT 0 - 44 U/L   15    Lipid Panel     Component Value Date/Time   CHOL 212 (H) 12/31/2022 1519   TRIG 216 (H) 12/31/2022 1519   HDL 35 (L) 12/31/2022 1519   CHOLHDL 6.1 (H) 12/31/2022 1519   LDLCALC 138 (H) 12/31/2022 1519    CBC    Component Value Date/Time   WBC 11.3 (H) 02/25/2023 0325   RBC 4.98 02/25/2023 0325   HGB 13.6 02/25/2023 0325   HGB 15.5 12/31/2022 1519   HCT 40.6 02/25/2023 0325   HCT 48.2 12/31/2022 1519   PLT 290 02/25/2023 0325   PLT 329 12/31/2022 1519   MCV 81.5 02/25/2023 0325   MCV 85 12/31/2022 1519   MCH 27.3 02/25/2023 0325   MCHC 33.5 02/25/2023 0325   RDW 13.2 02/25/2023 0325   RDW 13.4 12/31/2022 1519   LYMPHSABS 3.0 02/24/2023 1244   LYMPHSABS 3.3 (H) 06/04/2016 1030   MONOABS 1.1 (H) 02/24/2023 1244   EOSABS 0.3 02/24/2023 1244   EOSABS 0.1 06/04/2016 1030    BASOSABS 0.1 02/24/2023 1244   BASOSABS 0.0 06/04/2016 1030    ASSESSMENT AND PLAN: 1. Type 2 diabetes mellitus with morbid obesity (HCC) A1c improved but not at goal.  Continue glargine insulin 40 units daily.  Continue NovoLog 5 units with meals and metformin XR 2 g daily. Encouraged him to choose healthier food options. - Continuous Glucose Receiver (DEXCOM G7 RECEIVER) DEVI; Use as directed to check blood sugar.  Dispense: 1 each; Refill: 0 - Continuous Glucose Sensor (DEXCOM G7 SENSOR) MISC; Change sensor every 10 days  Dispense: 3 each; Refill: 6 - Ambulatory referral to Ophthalmology  2. Long term (current) use of insulin (HCC)   3. Long term (current) use of oral hypoglycemic drugs   4. Hyperlipidemia due to type 2 diabetes mellitus (HCC) Continue pravastatin.  5. Major depressive disorder, recurrent episode, moderate (HCC) Plugged in with behavioral health.  Patient was given the opportunity to ask questions.  Patient verbalized understanding of the plan and was able to repeat key elements of the plan.   This documentation was completed using Paediatric nurse.  Any transcriptional errors are unintentional.  No orders of the defined types were placed in this encounter.    Requested Prescriptions    No prescriptions requested or ordered in this encounter    No follow-ups on file.  [name], MD, FACP

## 2023-04-08 ENCOUNTER — Telehealth: Payer: Self-pay

## 2023-04-08 NOTE — Telephone Encounter (Signed)
Copied from CRM [id number]. Topic: General - Inquiry >> Apr 08, 2023 11:00 AM De Blanch wrote: Reason for CRM: [facility] stated will be sending documents for pt to get diabetic shoes. Ordered by podiatrist but needs MD signature.  Please advise.

## 2023-04-09 NOTE — Telephone Encounter (Signed)
Form received and placed in provider box. Awaiting signature.

## 2023-04-27 DIAGNOSIS — E119 Type 2 diabetes mellitus without complications: Secondary | ICD-10-CM | POA: Diagnosis not present

## 2023-04-27 LAB — HM DIABETES EYE EXAM

## 2023-04-27 NOTE — Telephone Encounter (Signed)
AttnJohna Roles, CMA   Its been over two weeks and we have not received the document.  Can you please call and tell me what is the status of this document.   Patient is here now.

## 2023-05-10 ENCOUNTER — Encounter (HOSPITAL_COMMUNITY): Payer: Self-pay | Admitting: Family

## 2023-05-10 ENCOUNTER — Telehealth (HOSPITAL_BASED_OUTPATIENT_CLINIC_OR_DEPARTMENT_OTHER): Payer: 59 | Admitting: Family

## 2023-05-10 DIAGNOSIS — F331 Major depressive disorder, recurrent, moderate: Secondary | ICD-10-CM | POA: Diagnosis not present

## 2023-05-10 DIAGNOSIS — F900 Attention-deficit hyperactivity disorder, predominantly inattentive type: Secondary | ICD-10-CM

## 2023-05-10 MED ORDER — ATOMOXETINE HCL 80 MG PO CAPS: 80.0000 mg | ORAL_CAPSULE | Freq: Every day | ORAL | 0 refills | Status: DC

## 2023-05-10 MED ORDER — SERTRALINE HCL 50 MG PO TABS: 50.0000 mg | ORAL_TABLET | Freq: Every day | ORAL | 0 refills | Status: DC

## 2023-05-10 NOTE — Progress Notes (Signed)
Virtual Visit via Video Note  I connected with [name] on 05/10/23 at 10:30 AM EST by a video enabled telemedicine application and verified that I am speaking with the correct person using two identifiers.  Location: Patient: Home Provider: Office   I discussed the limitations of evaluation and management by telemedicine and the availability of in person appointments. The patient expressed understanding and agreed to proceed.    I discussed the assessment and treatment plan with the patient. The patient was provided an opportunity to ask questions and all were answered. The patient agreed with the plan and demonstrated an understanding of the instructions.   The patient was advised to call back or seek an in-person evaluation if the symptoms worsen or if the condition fails to improve as anticipated.  I provided 00 minutes of non-face-to-face time during this encounter.   Oneta Rack, NP   [facility] MD/PA/NP OP Progress Note  05/10/2023 10:55 AM [name]  MRN:  [id number]  Chief Complaint: " I am doing okay."  [name] 42 year old male presents for medication management follow-up appointment.  Carries a diagnosis of major depressive disorder, generalized anxiety disorder adjustment disorder and attention deficit disorder.    [name] is currently he is prescribed Zoloft 50 mg and Strattera 40 mg daily which he reports he has been taking and tolerating well.  Requesting an increase to his Strattera 40 mg as he continues to endorse decreased concentration and states he has been prescribed Strattera 80 mg in the past which appears to help with his reported symptoms. Stated that he hasn't taking looking for work due to decrease motivation, however was "thinking" about applying for a delivery services app.   He is rating his depression and anxiety 3 or 4 out of 10 rating scale with 10 being the worst.  No concerns related to suicidal or homicidal ideations.  He  reports a good appetite.  States he is resting well throughout the night.  Discussed following up 3 months for medication management appointment.  Patient was receptive to plan.  Support, encouragement  and reassurance was provided.  [name] is resting in bed, patient was seen and evaluated through caregility; he is alert/oriented x 3; calm/cooperative; and mood congruent with affect. Presented with flat and guarded affect, but is pleasant. Patient is speaking in a clear tone at moderate volume, and normal pace; with good eye contact. His thought process is coherent and relevant; There is no indication that he is currently responding to internal/external stimuli or experiencing delusional thought content.  Patient denies suicidal/self-harm/homicidal ideation, psychosis, and paranoia.  Patient has remained calm throughout assessment and has answered questions appropriately.     Visit Diagnosis:    ICD-10-CM   1. Major depressive disorder, recurrent episode, moderate (HCC)  F33.1     2. Attention deficit hyperactivity disorder (ADHD), predominantly inattentive type  F90.0       Past Psychiatric History:   Past Medical History:  Past Medical History:  Diagnosis Date   ADHD    Anxiety    Bipolar disorder (HCC)    Depression    Diabetes mellitus without complication (HCC)    type 2   Heart murmur    as a child   HLD (hyperlipidemia)    Hypertension    no meds   Peripheral vascular disease (HCC)     Past Surgical History:  Procedure Laterality Date   AMPUTATION Left 03/06/2020   Procedure: LEFT GREAT TOE AMPUTATION;  Surgeon: [name], MD;  Location: [facility] OR;  Service: Orthopedics;  Laterality: Left;   AMPUTATION Right 10/15/2021   Procedure: RIGHT GREAT TOE AMPUTATION;  Surgeon: [name], MD;  Location: [facility] OR;  Service: Orthopedics;  Laterality: Right;   BONE BIOPSY Left 02/25/2023   Procedure: LEFT FIFTH METATARSAL BONE BIOPSY;  Surgeon: Rosetta Posner, DPM;   Location: [facility] ORS;  Service: Podiatry;  Laterality: Left;   EYE SURGERY Bilateral    lasik   TRANSMETATARSAL AMPUTATION Right 02/25/2023   Procedure: RIGHT PARTIAL FIRST RAY AMPUTATION WITH ANTIBIOTIC BEAD APPLICATION;  Surgeon: Rosetta Posner, DPM;  Location: [facility] ORS;  Service: Podiatry;  Laterality: Right;   WISDOM TOOTH EXTRACTION      Family Psychiatric History:   Family History:  Family History  Problem Relation Age of Onset   Diabetes Mother    Hyperlipidemia Father    Hypertension Father     Social History:  Social History   Socioeconomic History   Marital status: Single    Spouse name: Not on file   Number of children: Not on file   Years of education: Not on file   Highest education level: Not on file  Occupational History   Occupation: Unemployed  Tobacco Use   Smoking status: Never   Smokeless tobacco: Never  Vaping Use   Vaping status: Never Used  Substance and Sexual Activity   Alcohol use: Not Currently    Comment: last drink 2018   Drug use: Never   Sexual activity: Not Currently  Other Topics Concern   Not on file  Social History Narrative   Live with parents in [place].    Social Drivers of Health   Financial Resource Strain: High Risk (04/06/2023)   Overall Financial Resource Strain (CARDIA)    Difficulty of Paying Living Expenses: Very hard  Food Insecurity: Food Insecurity Present (04/06/2023)   Hunger Vital Sign    Worried About Running Out of Food in the Last Year: Sometimes true    Ran Out of Food in the Last Year: Never true  Transportation Needs: Unmet Transportation Needs (04/06/2023)   PRAPARE - Administrator, Civil Service (Medical): Yes    Lack of Transportation (Non-Medical): No  Physical Activity: Inactive (04/06/2023)   Exercise Vital Sign    Days of Exercise per Week: 0 days    Minutes of Exercise per Session: 0 min  Stress: Stress Concern Present (04/06/2023)   [employer] of Occupational Health -  Occupational Stress Questionnaire    Feeling of Stress : Very much  Social Connections: Socially Isolated (04/06/2023)   Social Connection and Isolation Panel [NHANES]    Frequency of Communication with Friends and Family: More than three times a week    Frequency of Social Gatherings with Friends and Family: More than three times a week    Attends Religious Services: Never    Database administrator or Organizations: No    Attends Banker Meetings: Never    Marital Status: Never married    Allergies:  Allergies  Allergen Reactions   Milk-Related Compounds Diarrhea    Metabolic Disorder Labs: Lab Results  Component Value Date   HGBA1C 9.6 (H) 02/24/2023   MPG 228.82 02/24/2023   MPG 291.96 03/03/2020   No results found for: "PROLACTIN" Lab Results  Component Value Date   CHOL 212 (H) 12/31/2022   TRIG 216 (H) 12/31/2022   HDL 35 (L) 12/31/2022   CHOLHDL 6.1 (H) 12/31/2022  LDLCALC 138 (H) 12/31/2022   LDLCALC 107 (H) 06/05/2021   No results found for: "TSH"  Therapeutic Level Labs: No results found for: "LITHIUM" No results found for: "VALPROATE" No results found for: "CBMZ"  Current Medications: Current Outpatient Medications  Medication Sig Dispense Refill   acetaminophen (TYLENOL) 325 MG tablet Take 2 tablets (650 mg total) by mouth every 6 (six) hours as needed for mild pain or moderate pain (or Fever >/= 101). (Patient not taking: Reported on 04/06/2023)     acidophilus (RISAQUAD) CAPS capsule Take 1 capsule by mouth 3 (three) times daily. (Patient not taking: Reported on 04/06/2023) 90 capsule 0   atomoxetine (STRATTERA) 40 MG capsule Take 1 capsule (40 mg total) by mouth daily. 30 capsule 0   atomoxetine (STRATTERA) 80 MG capsule Take 1 capsule (80 mg total) by mouth daily. 30 capsule 0   blood glucose meter kit and supplies Dispense based on patient and insurance preference. Use up to four times daily as directed. (FOR ICD-10 E10.9, E11.9). 1 each  0   Continuous Glucose Receiver (DEXCOM G7 RECEIVER) DEVI Use as directed to check blood sugar. 1 each 0   Continuous Glucose Sensor (DEXCOM G7 SENSOR) MISC Change sensor every 10 days 3 each 6   insulin aspart (NOVOLOG) 100 UNIT/ML FlexPen Inject 5 Units into the skin 3 (three) times daily with meals. 15 mL 0   Insulin Glargine (BASAGLAR KWIKPEN) 100 UNIT/ML Inject 60 Units into the skin at bedtime. 15 mL 6   Insulin Pen Needle (PEN NEEDLES) 31G X 8 MM MISC UAD 100 each 6   metFORMIN (GLUCOPHAGE-XR) 500 MG 24 hr tablet Take 4 tablets (2,000 mg total) by mouth daily with breakfast. 360 tablet 1   mupirocin ointment (BACTROBAN) 2 % Apply 1 Application topically daily for 7 days. 22 g 1   pravastatin (PRAVACHOL) 40 MG tablet Take 1 tablet (40 mg total) by mouth once daily. 90 tablet 1   senna-docusate (SENOKOT-S) 8.6-50 MG tablet Take 1 tablet by mouth at bedtime as needed for mild constipation. (Patient not taking: Reported on 04/06/2023)     sertraline (ZOLOFT) 50 MG tablet Take 1 tablet (50 mg total) by mouth daily. 30 tablet 3   No current facility-administered medications for this visit.     Musculoskeletal:   Psychiatric Specialty Exam: Review of Systems  Psychiatric/Behavioral:  Positive for decreased concentration. The patient is nervous/anxious.   All other systems reviewed and are negative.   There were no vitals taken for this visit.There is no height or weight on file to calculate BMI.  General Appearance: Casual  Eye Contact:  Good  Speech:  Clear and Coherent  Volume:  Normal  Mood:  Anxious and Depressed  Affect:  Flat  Thought Process:  Coherent  Orientation:  Full (Time, Place, and Person)  Thought Content: Logical   Suicidal Thoughts:  No  Homicidal Thoughts:  No  Memory:  Immediate;   Good Recent;   Good  Judgement:  Good  Insight:  Good  Psychomotor Activity:  Normal  Concentration:  Concentration: Good  Recall:  Good  Fund of Knowledge: Good  Language:  Good  Akathisia:  No  Handed:  Right  AIMS (if indicated): not done  Assets:  Communication Skills Desire for Improvement Resilience Social Support  ADL's:  Intact  Cognition: WNL  Sleep:  Good   Screenings: GAD-7    Flowsheet Row Office Visit from 04/06/2023 in [facility] - A Dept Of  [place]. [facility] Office Visit from 12/31/2022 in [name] - A Dept Of Eligha Bridegroom. [facility] Office Visit from 04/28/2022 in [facility] [place] [facility] - A Dept Of Eligha Bridegroom. [facility] Video Visit from 01/23/2022 in [facility] Office Visit from 12/23/2021 in [facility] [place] - A Dept Of [place]. [facility]  Total GAD-7 Score 4 7 7 4 7       PHQ2-9    Flowsheet Row Office Visit from 04/06/2023 in [facility] [place] - A Dept Of [place] Office Visit from 03/29/2023 in [facility] Office Visit from 12/31/2022 in [facility] - A Dept Of Branch. [facility] Office Visit from 04/28/2022 in [facility] [place] - A Dept Of Eligha Bridegroom. [facility] Video Visit from 01/23/2022 in [facility]  PHQ-2 Total Score 2 4 3 3 2   PHQ-9 Total Score 8 14 12 10 8       Flowsheet Row ED to Hosp-Admission (Discharged) from 02/24/2023 in [facility] Video Visit from 10/28/2021 in [facility] Admission (Discharged) from 10/15/2021 in [place] PERIOPERATIVE AREA  C-SSRS RISK CATEGORY No Risk No Risk No Risk        Assessment and Plan: [name] 43 year old Caucasian male presents for medication management appointment.  Carries a diagnosis related to major depressive disorder generalized anxiety disorder and attention deficit disorder.  Previously prescribed Zoloft and Strattera  which he reports he has been taking and tolerating well.  Discussed titrating this Strattera 40 mg to 80 mg daily.  Will continue Zoloft 50 mg as he reports no documented side effects states he feels that his mood has stabilized. Ongoing concerns related to decreased motivation and poor concentration.  States he has not started seeking employment at this time however had thoughts to apply for delivery company.  No concerns related to safety illicit drug use or substance abuse.  Patient to follow-up 3 months for medication management he was receptive to plan.  Adjustment disorder mixed depression and anxiety: Continue Zoloft 50 mg daily Increase Strattera 40 mg to 80 mg daily Follow-up 3 months for medication management appointment   Collaboration of Care: Collaboration of Care: Other increase Strattera  Patient/Guardian was advised Release of Information must be obtained prior to any record release in order to collaborate their care with an outside provider. Patient/Guardian was advised if they have not already done so to contact the registration department to sign all necessary forms in order for [name] to release information regarding their care.   Consent: Patient/Guardian gives verbal consent for treatment and assignment of benefits for services provided during this visit. Patient/Guardian expressed understanding and agreed to proceed.    Oneta Rack, NP 05/10/2023, 10:55 AM

## 2023-05-12 ENCOUNTER — Other Ambulatory Visit: Payer: Self-pay

## 2023-05-12 DIAGNOSIS — S98132A Complete traumatic amputation of one left lesser toe, initial encounter: Secondary | ICD-10-CM | POA: Diagnosis not present

## 2023-05-12 DIAGNOSIS — E1142 Type 2 diabetes mellitus with diabetic polyneuropathy: Secondary | ICD-10-CM | POA: Diagnosis not present

## 2023-05-12 DIAGNOSIS — S98131A Complete traumatic amputation of one right lesser toe, initial encounter: Secondary | ICD-10-CM | POA: Diagnosis not present

## 2023-05-13 ENCOUNTER — Other Ambulatory Visit: Payer: Self-pay

## 2023-05-14 ENCOUNTER — Other Ambulatory Visit: Payer: Self-pay

## 2023-05-20 NOTE — Progress Notes (Signed)
 Established Patient Visit:  Date of Service: 05/24/2023  CC:  Chief Complaint  Patient presents with  . Office Visit    Post Operative Visit Last saw Dr. Rudolpho 04/06/2023    [name] is a 42 y.o. who presents for f/u left 4th MTPJ ulceration.  Patient's last A1c was 9.6%.  PMHx: No past medical history on file.  Medication: Current Outpatient Medications on File Prior to Visit  Medication Sig Dispense Refill  . atomoxetine  (STRATTERA ) 80 MG capsule Take 80 mg by mouth once daily    . BASAGLAR  KWIKPEN U-100 INSULIN  pen injector (concentration 100 units/mL) Inject subcutaneously    . L. acidophilus/Bifid. animalis 32 billion cell Cap Take 1 capsule by mouth 3 (three) times daily    . metFORMIN  (GLUCOPHAGE -XR) 500 MG XR tablet Take by mouth    . mupirocin  (BACTROBAN ) 2 % ointment Apply 1 Application topically    . NOVOLOG  FLEXPEN U-100 INSULIN  pen injector (concentration 100 units/mL) Inject subcutaneously    . pravastatin  (PRAVACHOL ) 40 MG tablet Take 40 mg by mouth once daily    . sertraline  (ZOLOFT ) 50 MG tablet Take 50 mg by mouth once daily     No current facility-administered medications on file prior to visit.    Allergies: Allergies as of 05/24/2023 - Reviewed 05/24/2023  Allergen Reaction Noted  . Milk containing products (dairy) Rash 07/30/2017    Social History: Social History   Socioeconomic History  . Marital status: Single  Tobacco Use  . Smoking status: Never  . Smokeless tobacco: Never   Social Drivers of Corporate investment banker Strain: Patient Declined (05/24/2023)   Overall Financial Resource Strain (CARDIA)   . Difficulty of Paying Living Expenses: Patient declined  Recent Concern: Financial Resource Strain - High Risk (04/06/2023)   Received from [facility]   Overall Financial Resource Strain (CARDIA)   . Difficulty of Paying Living Expenses: Very hard  Food Insecurity: Patient Declined (05/24/2023)   Hunger Vital Sign   .  Worried About Programme researcher, broadcasting/film/video in the Last Year: Patient declined   . Ran Out of Food in the Last Year: Patient declined  Recent Concern: Food Insecurity - Food Insecurity Present (04/06/2023)   [facility]   Hunger Vital Sign   . Worried About Programme researcher, broadcasting/film/video in the Last Year: Sometimes true   . Ran Out of Food in the Last Year: Never true  Transportation Needs: Patient Declined (05/24/2023)   PRAPARE - Transportation   . Lack of Transportation (Medical): Patient declined   . Lack of Transportation (Non-Medical): Patient declined  Recent Concern: Transportation Needs - Unmet Transportation Needs (04/06/2023)   Received from [facility] - Transportation   . Lack of Transportation (Medical): Yes   . Lack of Transportation (Non-Medical): No  Physical Activity: Inactive (04/06/2023)   Received from [facility]   Exercise Vital Sign   . Days of Exercise per Week: 0 days   . Minutes of Exercise per Session: 0 min  Stress: Stress Concern Present (04/06/2023)   Received from [facility] of Occupational Health - Occupational Stress Questionnaire   . Feeling of Stress : Very much  Social Connections: Socially Isolated (04/06/2023)   Received from [facility]   Social Connection and Isolation Panel [NHANES]   . Frequency of Communication with Friends and Family: More than three times a week   . Frequency of Social Gatherings with Friends and Family:  More than three times a week   . Attends Religious Services: Never   . Active Member of Clubs or Organizations: No   . Attends Banker Meetings: Never   . Marital Status: Never married  Housing Stability: Patient Declined (05/24/2023)   Housing Stability Vital Sign   . Unable to Pay for Housing in the Last Year: Patient declined   . Homeless in the Last Year: Patient declined   Social History   Tobacco Use  Smoking Status Never  Smokeless Tobacco Never    Family  History: No family history on file.   Surgical History: No past surgical history on file.   Review of Systems:  A comprehensive 14 point ROS was performed, reviewed, and the pertinent orthopaedic findings are documented in the HPI.  Objective: General/Constitutional: No apparent distress: well-nourished and well developed.  Psych: Normal mood and affect, oriented to person, place and time (AOx3)  Vascular: DP/PT pulses intact bil, CFT<3s to digits bil, hair growth to digits bil.  Neuro: Light touch sensation reduced to digits bil  Derm: Nails x 8 elongated, thickened, discolored, well trimmed.  Skin thin and dry.  Left fourth metatarsal phalangeal joint plantar with wound present that measures approximately 1 cm x 0.6 cm x dermis, appears to be fairly superficial at this time and starting to epithelialize, minimal callus buildup around the periphery.  No erythema or edema present, minimal serous drainage.  MSK: 5/5 MSK strength to BLE MSK groups.  Hallux amputation left, partial first ray potation right.  Xrays: none taken   Assessment: Encounter Diagnoses  Name Primary?  . 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 Type 2 diabetes mellitus with polyneuropathy (CMS/HHS-HCC)   . Amputated toe of right foot (CMS-HCC)   . Amputated toe of left foot (CMS-HCC)       Plan: -Pt was seen and examined. -Previous culture report showing group B strep agalactiae growing in bone/wound to both feet and chronic osteomyelitis seen on pathology report in both the first metatarsal right foot, left foot fifth metatarsal.  Patient has completed course of Augmentin .  Wounds are healed from previous issues but patient has ulcer today sub 4th MTPJ likely due to increased plantar pressure to the area. -Wound debridement performed to the left foot.   -Continue mupirocin  cream to the wound with offloading dressing changes every few days.  Given additional  offloading padding today. -Modified patients shoe to take pressure off of the 4th MTPJ with felt.  Discussed if unimproved would recommend going into a postoperative shoe try and walk with heel contact only and add additional padding to his dressing including offloading felt around the wound, 4 x 4 gauze, ABD pad, Kerlix, Ace wrap or Coban.  Discussed with patient to try to stay at the left foot is much as possible and if he does put weight he should be putting it only on his heel. -Continue to rec obtaining DM shoes with inserts -Recommend daily foot inspections, proper glycemic control, use of lotions to feet daily, no barefoot walking, and use of supportive shoes at all times.  Debridment of ulcer: Location: Left 4th MTPJ plantar Pre-debridement measurement: 1 cm x 0.6 cm x  dermis Post-debridement measurement: Same Tissue removed:   Hyperkeratotic tissue, biofilm, fibrous tissue Ulcer was debrided sharply with combination of tissue nippers and scalpel blade into the dermis   Return in about 2 weeks (around 06/07/2023) for L 4th MTPJ ulcer recheck.  No orders of the defined types were placed in this encounter.   Prentice Ozell Lee, DPM

## 2023-06-30 ENCOUNTER — Encounter: Payer: Self-pay | Admitting: Internal Medicine

## 2023-07-08 ENCOUNTER — Ambulatory Visit: Payer: 59 | Admitting: Internal Medicine

## 2023-07-21 ENCOUNTER — Other Ambulatory Visit (HOSPITAL_COMMUNITY): Payer: Self-pay | Admitting: Family

## 2023-07-22 ENCOUNTER — Other Ambulatory Visit (HOSPITAL_COMMUNITY): Payer: Self-pay

## 2023-07-22 ENCOUNTER — Other Ambulatory Visit: Payer: Self-pay

## 2023-08-06 ENCOUNTER — Other Ambulatory Visit (HOSPITAL_COMMUNITY): Payer: Self-pay

## 2023-08-12 ENCOUNTER — Other Ambulatory Visit: Payer: Self-pay

## 2023-08-12 ENCOUNTER — Other Ambulatory Visit (HOSPITAL_COMMUNITY): Payer: Self-pay

## 2023-08-12 MED ORDER — AMOXICILLIN 875 MG PO TABS
875.0000 mg | ORAL_TABLET | Freq: Two times a day (BID) | ORAL | 0 refills | Status: DC
  Filled 2023-08-12: qty 14, 7d supply, fill #0

## 2024-03-09 ENCOUNTER — Inpatient Hospital Stay (HOSPITAL_COMMUNITY): Payer: MEDICAID

## 2024-03-09 ENCOUNTER — Other Ambulatory Visit: Payer: Self-pay

## 2024-03-09 ENCOUNTER — Inpatient Hospital Stay (HOSPITAL_COMMUNITY)
Admission: EM | Admit: 2024-03-09 | Discharge: 2024-03-16 | DRG: 872 | Disposition: A | Discharge status: Home / Self Care | Payer: Self-pay | Attending: Internal Medicine | Admitting: Internal Medicine

## 2024-03-09 ENCOUNTER — Inpatient Hospital Stay (HOSPITAL_COMMUNITY): Payer: Self-pay

## 2024-03-09 ENCOUNTER — Encounter (HOSPITAL_COMMUNITY): Payer: Self-pay

## 2024-03-09 ENCOUNTER — Emergency Department (HOSPITAL_COMMUNITY): Payer: Self-pay

## 2024-03-09 DIAGNOSIS — E1169 Type 2 diabetes mellitus with other specified complication: Secondary | ICD-10-CM | POA: Diagnosis present

## 2024-03-09 DIAGNOSIS — E11621 Type 2 diabetes mellitus with foot ulcer: Secondary | ICD-10-CM | POA: Diagnosis present

## 2024-03-09 DIAGNOSIS — E785 Hyperlipidemia, unspecified: Secondary | ICD-10-CM | POA: Diagnosis present

## 2024-03-09 DIAGNOSIS — E8721 Acute metabolic acidosis: Secondary | ICD-10-CM | POA: Diagnosis present

## 2024-03-09 DIAGNOSIS — E1165 Type 2 diabetes mellitus with hyperglycemia: Secondary | ICD-10-CM | POA: Diagnosis present

## 2024-03-09 DIAGNOSIS — N179 Acute kidney failure, unspecified: Secondary | ICD-10-CM | POA: Diagnosis present

## 2024-03-09 DIAGNOSIS — Z7984 Long term (current) use of oral hypoglycemic drugs: Secondary | ICD-10-CM

## 2024-03-09 DIAGNOSIS — Z8249 Family history of ischemic heart disease and other diseases of the circulatory system: Secondary | ICD-10-CM | POA: Diagnosis not present

## 2024-03-09 DIAGNOSIS — F909 Attention-deficit hyperactivity disorder, unspecified type: Secondary | ICD-10-CM | POA: Diagnosis present

## 2024-03-09 DIAGNOSIS — I10 Essential (primary) hypertension: Secondary | ICD-10-CM | POA: Diagnosis present

## 2024-03-09 DIAGNOSIS — Z91011 Allergy to milk products, unspecified: Secondary | ICD-10-CM

## 2024-03-09 DIAGNOSIS — Z560 Unemployment, unspecified: Secondary | ICD-10-CM | POA: Diagnosis not present

## 2024-03-09 DIAGNOSIS — Z6841 Body Mass Index (BMI) 40.0 and over, adult: Secondary | ICD-10-CM

## 2024-03-09 DIAGNOSIS — E119 Type 2 diabetes mellitus without complications: Secondary | ICD-10-CM | POA: Diagnosis not present

## 2024-03-09 DIAGNOSIS — Z1152 Encounter for screening for COVID-19: Secondary | ICD-10-CM | POA: Diagnosis not present

## 2024-03-09 DIAGNOSIS — L97529 Non-pressure chronic ulcer of other part of left foot with unspecified severity: Secondary | ICD-10-CM | POA: Diagnosis present

## 2024-03-09 DIAGNOSIS — E860 Dehydration: Secondary | ICD-10-CM | POA: Diagnosis present

## 2024-03-09 DIAGNOSIS — M86172 Other acute osteomyelitis, left ankle and foot: Secondary | ICD-10-CM | POA: Diagnosis present

## 2024-03-09 DIAGNOSIS — R6520 Severe sepsis without septic shock: Secondary | ICD-10-CM | POA: Diagnosis present

## 2024-03-09 DIAGNOSIS — R739 Hyperglycemia, unspecified: Secondary | ICD-10-CM | POA: Diagnosis present

## 2024-03-09 DIAGNOSIS — Z89411 Acquired absence of right great toe: Secondary | ICD-10-CM

## 2024-03-09 DIAGNOSIS — Z833 Family history of diabetes mellitus: Secondary | ICD-10-CM | POA: Diagnosis not present

## 2024-03-09 DIAGNOSIS — K8020 Calculus of gallbladder without cholecystitis without obstruction: Secondary | ICD-10-CM | POA: Diagnosis present

## 2024-03-09 DIAGNOSIS — A419 Sepsis, unspecified organism: Principal | ICD-10-CM | POA: Diagnosis present

## 2024-03-09 DIAGNOSIS — L03032 Cellulitis of left toe: Secondary | ICD-10-CM | POA: Diagnosis present

## 2024-03-09 DIAGNOSIS — B9561 Methicillin susceptible Staphylococcus aureus infection as the cause of diseases classified elsewhere: Secondary | ICD-10-CM | POA: Diagnosis present

## 2024-03-09 DIAGNOSIS — Z83438 Family history of other disorder of lipoprotein metabolism and other lipidemia: Secondary | ICD-10-CM

## 2024-03-09 DIAGNOSIS — E1151 Type 2 diabetes mellitus with diabetic peripheral angiopathy without gangrene: Secondary | ICD-10-CM | POA: Diagnosis present

## 2024-03-09 DIAGNOSIS — T383X6A Underdosing of insulin and oral hypoglycemic [antidiabetic] drugs, initial encounter: Secondary | ICD-10-CM | POA: Diagnosis present

## 2024-03-09 DIAGNOSIS — L089 Local infection of the skin and subcutaneous tissue, unspecified: Secondary | ICD-10-CM | POA: Diagnosis present

## 2024-03-09 DIAGNOSIS — R7881 Bacteremia: Secondary | ICD-10-CM | POA: Diagnosis present

## 2024-03-09 DIAGNOSIS — A4101 Sepsis due to Methicillin susceptible Staphylococcus aureus: Secondary | ICD-10-CM | POA: Diagnosis present

## 2024-03-09 DIAGNOSIS — M869 Osteomyelitis, unspecified: Secondary | ICD-10-CM | POA: Diagnosis present

## 2024-03-09 DIAGNOSIS — F418 Other specified anxiety disorders: Secondary | ICD-10-CM | POA: Diagnosis not present

## 2024-03-09 DIAGNOSIS — F331 Major depressive disorder, recurrent, moderate: Secondary | ICD-10-CM | POA: Diagnosis present

## 2024-03-09 DIAGNOSIS — Z79899 Other long term (current) drug therapy: Secondary | ICD-10-CM

## 2024-03-09 DIAGNOSIS — Z59868 Other specified financial insecurity: Secondary | ICD-10-CM

## 2024-03-09 DIAGNOSIS — Z794 Long term (current) use of insulin: Secondary | ICD-10-CM

## 2024-03-09 DIAGNOSIS — Z91141 Patient's other noncompliance with medication regimen due to financial hardship: Secondary | ICD-10-CM

## 2024-03-09 DIAGNOSIS — Z89412 Acquired absence of left great toe: Secondary | ICD-10-CM

## 2024-03-09 DIAGNOSIS — L8990 Pressure ulcer of unspecified site, unspecified stage: Secondary | ICD-10-CM | POA: Insufficient documentation

## 2024-03-09 LAB — URINALYSIS, W/ REFLEX TO CULTURE (INFECTION SUSPECTED)
Bacteria, UA: NONE SEEN
Bilirubin Urine: NEGATIVE
Glucose, UA: >=500 mg/dL — AB
Hgb urine dipstick: NEGATIVE
Ketones, ur: 20 mg/dL — AB
Leukocytes,Ua: NEGATIVE
Nitrite: NEGATIVE
Protein, ur: NEGATIVE mg/dL
Specific Gravity, Urine: 1.021 (ref 1.005–1.030)
pH: 6 (ref 5.0–8.0)

## 2024-03-09 LAB — I-STAT VENOUS BLOOD GAS, ED
Acid-base deficit: 2 mmol/L (ref 0.0–2.0)
Bicarbonate: 22.7 mmol/L (ref 20.0–28.0)
Calcium, Ion: 1.16 mmol/L (ref 1.15–1.40)
HCT: 42 % (ref 39.0–52.0)
Hemoglobin: 14.3 g/dL (ref 13.0–17.0)
O2 Saturation: 92 %
Potassium: 3.8 mmol/L (ref 3.5–5.1)
Sodium: 138 mmol/L (ref 135–145)
TCO2: 24 mmol/L (ref 22–32)
pCO2, Ven: 38.1 mmHg — ABNORMAL LOW (ref 44–60)
pH, Ven: 7.382 (ref 7.25–7.43)
pO2, Ven: 65 mmHg — ABNORMAL HIGH (ref 32–45)

## 2024-03-09 LAB — TROPONIN I (HIGH SENSITIVITY)
Troponin I (High Sensitivity): 16 ng/L (ref <18)
Troponin I (High Sensitivity): 14 ng/L (ref <18)

## 2024-03-09 LAB — RESP PANEL BY RT-PCR (RSV, FLU A&B, COVID)  RVPGX2
Influenza A by PCR: NEGATIVE
Influenza B by PCR: NEGATIVE
Resp Syncytial Virus by PCR: NEGATIVE
SARS Coronavirus 2 by RT PCR: NEGATIVE

## 2024-03-09 LAB — I-STAT CG4 LACTIC ACID, ED: Lactic Acid, Venous: 2.6 mmol/L (ref 0.5–1.9)

## 2024-03-09 LAB — COMPREHENSIVE METABOLIC PANEL WITH GFR
ALT: 22 U/L (ref 0–44)
AST: 29 U/L (ref 15–41)
Albumin: 3.7 g/dL (ref 3.5–5.0)
Alkaline Phosphatase: 106 U/L (ref 38–126)
Anion gap: 16 — ABNORMAL HIGH (ref 5–15)
BUN: 13 mg/dL (ref 6–20)
CO2: 20 mmol/L — ABNORMAL LOW (ref 22–32)
Calcium: 8.7 mg/dL — ABNORMAL LOW (ref 8.9–10.3)
Chloride: 98 mmol/L (ref 98–111)
Creatinine, Ser: 1.25 mg/dL — ABNORMAL HIGH (ref 0.61–1.24)
GFR, Estimated: >60 mL/min (ref >60)
Glucose, Bld: 446 mg/dL — ABNORMAL HIGH (ref 70–99)
Potassium: 3.9 mmol/L (ref 3.5–5.1)
Sodium: 134 mmol/L — ABNORMAL LOW (ref 135–145)
Total Bilirubin: 0.8 mg/dL (ref 0.0–1.2)
Total Protein: 7.1 g/dL (ref 6.5–8.1)

## 2024-03-09 LAB — CBC WITH DIFFERENTIAL/PLATELET
Abs Immature Granulocytes: 0.06 K/uL (ref 0.00–0.07)
Basophils Absolute: 0 K/uL (ref 0.0–0.1)
Basophils Relative: 0 %
Eosinophils Absolute: 0 K/uL (ref 0.0–0.5)
Eosinophils Relative: 0 %
HCT: 47.5 % (ref 39.0–52.0)
Hemoglobin: 16.1 g/dL (ref 13.0–17.0)
Immature Granulocytes: 1 %
Lymphocytes Relative: 5 %
Lymphs Abs: 0.6 K/uL — ABNORMAL LOW (ref 0.7–4.0)
MCH: 28.4 pg (ref 26.0–34.0)
MCHC: 33.9 g/dL (ref 30.0–36.0)
MCV: 83.9 fL (ref 80.0–100.0)
Monocytes Absolute: 0.9 K/uL (ref 0.1–1.0)
Monocytes Relative: 7 %
Neutro Abs: 10.5 K/uL — ABNORMAL HIGH (ref 1.7–7.7)
Neutrophils Relative %: 87 %
Platelets: 188 K/uL (ref 150–400)
RBC: 5.66 MIL/uL (ref 4.22–5.81)
RDW: 12.9 % (ref 11.5–15.5)
WBC: 12.1 K/uL — ABNORMAL HIGH (ref 4.0–10.5)
nRBC: 0 % (ref 0.0–0.2)

## 2024-03-09 LAB — PROTIME-INR
INR: 1.1 (ref 0.8–1.2)
Prothrombin Time: 14.7 s (ref 11.4–15.2)

## 2024-03-09 LAB — I-STAT CHEM 8, ED
BUN: 14 mg/dL (ref 6–20)
Calcium, Ion: 1.14 mmol/L — ABNORMAL LOW (ref 1.15–1.40)
Chloride: 102 mmol/L (ref 98–111)
Creatinine, Ser: 1 mg/dL (ref 0.61–1.24)
Glucose, Bld: 427 mg/dL — ABNORMAL HIGH (ref 70–99)
HCT: 44 % (ref 39.0–52.0)
Hemoglobin: 15 g/dL (ref 13.0–17.0)
Potassium: 3.8 mmol/L (ref 3.5–5.1)
Sodium: 139 mmol/L (ref 135–145)
TCO2: 21 mmol/L — ABNORMAL LOW (ref 22–32)

## 2024-03-09 LAB — LACTIC ACID, PLASMA: Lactic Acid, Venous: 1.6 mmol/L (ref 0.5–1.9)

## 2024-03-09 LAB — LIPASE, BLOOD: Lipase: 20 U/L (ref 11–51)

## 2024-03-09 LAB — CBG MONITORING, ED: Glucose-Capillary: 349 mg/dL — ABNORMAL HIGH (ref 70–99)

## 2024-03-09 MED ORDER — SODIUM CHLORIDE 0.9 % IV SOLN
2.0000 g | Freq: Three times a day (TID) | INTRAVENOUS | Status: DC
  Administered 2024-03-09 – 2024-03-11 (×4): 2 g via INTRAVENOUS
  Filled 2024-03-09 (×4): qty 12.5

## 2024-03-09 MED ORDER — INSULIN ASPART 100 UNIT/ML IJ SOLN
4.0000 [IU] | Freq: Three times a day (TID) | INTRAMUSCULAR | Status: DC
Start: 2024-03-10 — End: 2024-03-09

## 2024-03-09 MED ORDER — INSULIN ASPART 100 UNIT/ML IJ SOLN
4.0000 [IU] | Freq: Three times a day (TID) | INTRAMUSCULAR | Status: DC
  Administered 2024-03-10 – 2024-03-16 (×17): 4 [IU] via SUBCUTANEOUS

## 2024-03-09 MED ORDER — VANCOMYCIN HCL 10 G IV SOLR
2500.0000 mg | Freq: Once | INTRAVENOUS | Status: AC
  Administered 2024-03-09: 2500 mg via INTRAVENOUS
  Filled 2024-03-09: qty 2500

## 2024-03-09 MED ORDER — LACTATED RINGERS IV SOLN: 150.0000 mL/h | INTRAVENOUS | Status: DC

## 2024-03-09 MED ORDER — INSULIN GLARGINE-YFGN 100 UNIT/ML ~~LOC~~ SOLN
20.0000 [IU] | Freq: Every day | SUBCUTANEOUS | Status: DC
  Administered 2024-03-10 – 2024-03-11 (×3): 20 [IU] via SUBCUTANEOUS
  Filled 2024-03-09 (×5): qty 0.2

## 2024-03-09 MED ORDER — LACTATED RINGERS IV BOLUS (SEPSIS)
1000.0000 mL | Freq: Once | INTRAVENOUS | Status: AC
  Administered 2024-03-09: 1000 mL via INTRAVENOUS

## 2024-03-09 MED ORDER — HEPARIN SODIUM (PORCINE) 5000 UNIT/ML IJ SOLN
5000.0000 [IU] | Freq: Three times a day (TID) | INTRAMUSCULAR | Status: DC
  Administered 2024-03-09 – 2024-03-16 (×20): 5000 [IU] via SUBCUTANEOUS
  Filled 2024-03-09 (×21): qty 1

## 2024-03-09 MED ORDER — IOHEXOL 350 MG/ML SOLN
100.0000 mL | Freq: Once | INTRAVENOUS | Status: AC | PRN
  Administered 2024-03-09: 100 mL via INTRAVENOUS

## 2024-03-09 MED ORDER — LACTATED RINGERS IV SOLN: INTRAVENOUS | Status: AC

## 2024-03-09 MED ORDER — LACTATED RINGERS IV BOLUS
1000.0000 mL | Freq: Once | INTRAVENOUS | Status: AC
  Administered 2024-03-09: 1000 mL via INTRAVENOUS

## 2024-03-09 MED ORDER — SODIUM CHLORIDE 0.9 % IV SOLN: 2.0000 g | Freq: Once | INTRAVENOUS | Status: DC

## 2024-03-09 MED ORDER — VANCOMYCIN HCL IN DEXTROSE 1-5 GM/200ML-% IV SOLN: 1000.0000 mg | Freq: Once | INTRAVENOUS | Status: DC

## 2024-03-09 MED ORDER — ACETAMINOPHEN 325 MG PO TABS
650.0000 mg | ORAL_TABLET | Freq: Once | ORAL | Status: AC
  Administered 2024-03-09: 650 mg via ORAL
  Filled 2024-03-09: qty 2

## 2024-03-09 MED ORDER — METRONIDAZOLE 500 MG/100ML IV SOLN
500.0000 mg | Freq: Once | INTRAVENOUS | Status: AC
  Administered 2024-03-09: 500 mg via INTRAVENOUS
  Filled 2024-03-09: qty 100

## 2024-03-09 MED ORDER — VANCOMYCIN HCL 1750 MG/350ML IV SOLN
1750.0000 mg | Freq: Two times a day (BID) | INTRAVENOUS | Status: DC
  Administered 2024-03-10 (×2): 1750 mg via INTRAVENOUS
  Filled 2024-03-09 (×3): qty 350

## 2024-03-09 MED ORDER — LACTATED RINGERS IV BOLUS (SEPSIS)
600.0000 mL | Freq: Once | INTRAVENOUS | Status: AC
  Administered 2024-03-09: 600 mL via INTRAVENOUS

## 2024-03-09 MED ORDER — METRONIDAZOLE 500 MG/100ML IV SOLN
500.0000 mg | Freq: Two times a day (BID) | INTRAVENOUS | Status: DC
  Administered 2024-03-10 (×2): 500 mg via INTRAVENOUS
  Filled 2024-03-09 (×2): qty 100

## 2024-03-09 MED ORDER — INSULIN ASPART 100 UNIT/ML IJ SOLN
0.0000 [IU] | Freq: Three times a day (TID) | INTRAMUSCULAR | Status: DC
  Administered 2024-03-10: 11 [IU] via SUBCUTANEOUS
  Administered 2024-03-10 (×2): 8 [IU] via SUBCUTANEOUS
  Administered 2024-03-11: 5 [IU] via SUBCUTANEOUS
  Administered 2024-03-11: 8 [IU] via SUBCUTANEOUS
  Administered 2024-03-11 – 2024-03-12 (×2): 5 [IU] via SUBCUTANEOUS
  Administered 2024-03-12: 3 [IU] via SUBCUTANEOUS
  Administered 2024-03-12: 5 [IU] via SUBCUTANEOUS
  Administered 2024-03-13: 3 [IU] via SUBCUTANEOUS
  Administered 2024-03-13 (×2): 5 [IU] via SUBCUTANEOUS
  Administered 2024-03-14: 8 [IU] via SUBCUTANEOUS
  Administered 2024-03-14 – 2024-03-15 (×4): 5 [IU] via SUBCUTANEOUS
  Administered 2024-03-15: 8 [IU] via SUBCUTANEOUS
  Administered 2024-03-16 (×2): 5 [IU] via SUBCUTANEOUS

## 2024-03-09 MED ORDER — SODIUM CHLORIDE 0.9 % IV SOLN
2.0000 g | Freq: Once | INTRAVENOUS | Status: AC
  Administered 2024-03-09: 2 g via INTRAVENOUS
  Filled 2024-03-09: qty 12.5

## 2024-03-09 NOTE — ED Notes (Signed)
 Attempt to obtain lactic and second set of cultures unsuccessful by Clinical research associate and 2 phlebotomy employee. Provider made aware.

## 2024-03-09 NOTE — ED Provider Notes (Signed)
 [facility] Provider Note   CSN: [id number] Arrival date & time: 03/09/24  1441     History Chief Complaint  Patient presents with   Hyperglycemia    HPI: TAEVEON KEESLING is a 43 y.o. male with history pertinent for HTN, T2DM, prior DKA, elevated BMI,, prior sepsis, HLD, ADHD, bipolar disorder, prior osteomyelitis who presents complaining of syncope. Patient arrived via EMS from home.  History provided by patient and EMS.  No interpreter required during this encounter.  Patient reports that he was in his normal state of health last night.  Reports that this morning he got home and was overall feeling poorly, therefore did not take his morning insulin , ate some spaghetti for breakfast.  Reports that thereafter he got up and ambulated to the restroom.  After urinating he walked halfway back down the hall when he became short of breath, lightheaded, feverish, and had syncope and fall..  Notably did not have any chest pain.  EMS reports that the patient was initially tachycardic to the 150s and route with systolic in the 80s, however after fluids en route improved to the systolic 140s and tachycardia to the low 100s.  Patient reportedly had blood sugar of 400s en route, typically has blood sugars in the 200s to 300s.  Patient denies any localizing symptoms, states that he feels improved after fluids  Patient's recorded medical, surgical, social, medication list and allergies were reviewed in the Snapshot window as part of the initial history.   Prior to Admission medications   Medication Sig Start Date End Date Taking? Authorizing Provider  acetaminophen  (TYLENOL ) 325 MG tablet Take 2 tablets (650 mg total) by mouth every 6 (six) hours as needed for mild pain or moderate pain (or Fever >/= 101). Patient not taking: Reported on 04/06/2023 02/28/23   Alexander, Natalie, DO  acidophilus (RISAQUAD) CAPS capsule Take 1 capsule by mouth 3 (three) times  daily. Patient not taking: Reported on 04/06/2023 02/28/23   Alexander, Natalie, DO  amoxicillin  (AMOXIL ) 875 MG tablet Take 1 tablet (875 mg total) by mouth 2 (two) times daily. 08/12/23     atomoxetine  (STRATTERA ) 40 MG capsule Take 1 capsule (40 mg total) by mouth daily. 03/29/23   Ezzard Staci SAILOR, NP  atomoxetine  (STRATTERA ) 80 MG capsule Take 1 capsule (80 mg total) by mouth daily. 05/10/23   Ezzard Staci SAILOR, NP  blood glucose meter kit and supplies Dispense based on patient and insurance preference. Use up to four times daily as directed. (FOR ICD-10 E10.9, E11.9). 03/08/20   McQuilla, Jai B, MD  Continuous Glucose Receiver (DEXCOM G7 RECEIVER) DEVI Use as directed to check blood sugar. 04/06/23   Vicci Barnie NOVAK, MD  Continuous Glucose Sensor (DEXCOM G7 SENSOR) MISC Change sensor every 10 days 04/06/23   Vicci Barnie NOVAK, MD  insulin  aspart (NOVOLOG ) 100 UNIT/ML FlexPen Inject 5 Units into the skin 3 (three) times daily with meals. 02/28/23   Alexander, Natalie, DO  Insulin  Glargine (BASAGLAR  KWIKPEN) 100 UNIT/ML Inject 60 Units into the skin at bedtime. 01/21/23   Vicci Barnie NOVAK, MD  Insulin  Pen Needle (PEN NEEDLES) 31G X 8 MM MISC UAD 05/30/20   Vicci Barnie NOVAK, MD  metFORMIN  (GLUCOPHAGE -XR) 500 MG 24 hr tablet Take 4 tablets (2,000 mg total) by mouth daily with breakfast. 12/31/22   Vicci Barnie NOVAK, MD  mupirocin  ointment (BACTROBAN ) 2 % Apply 1 Application topically daily for 7 days. 03/08/23     pravastatin  (  PRAVACHOL ) 40 MG tablet Take 1 tablet (40 mg total) by mouth once daily. 12/31/22   Vicci Barnie NOVAK, MD  senna-docusate (SENOKOT-S) 8.6-50 MG tablet Take 1 tablet by mouth at bedtime as needed for mild constipation. Patient not taking: Reported on 04/06/2023 02/28/23   Alexander, Natalie, DO  sertraline  (ZOLOFT ) 50 MG tablet Take 1 tablet (50 mg total) by mouth daily. 05/10/23   Ezzard Staci SAILOR, NP     Allergies: Milk-related compounds   Review of Systems   ROS as per  HPI  Physical Exam Updated Vital Signs BP 127/67 (BP Location: Right Arm) Comment: Simultaneous filing. User may not have seen previous data.  Pulse (!) 133 Comment: Simultaneous filing. User may not have seen previous data.  Temp (!) 101.1 F (38.4 C) (Oral)   Resp (!) 29 Comment: Simultaneous filing. User may not have seen previous data.  Ht 6' 3 (1.905 m)   Wt (!) 149.7 kg   SpO2 99% Comment: Simultaneous filing. User may not have seen previous data.  BMI 41.25 kg/m  Physical Exam Vitals and nursing note reviewed.  Constitutional:      General: He is not in acute distress.    Appearance: He is well-developed. He is diaphoretic (Mildly).  HENT:     Head: Normocephalic and atraumatic.  Eyes:     Conjunctiva/sclera: Conjunctivae normal.  Cardiovascular:     Rate and Rhythm: Regular rhythm. Tachycardia present.     Heart sounds: No murmur heard. Pulmonary:     Effort: Pulmonary effort is normal. No respiratory distress.     Breath sounds: Normal breath sounds.  Abdominal:     Palpations: Abdomen is soft.     Tenderness: There is no abdominal tenderness.  Musculoskeletal:        General: No swelling.     Cervical back: Neck supple.  Skin:    General: Skin is warm.     Capillary Refill: Capillary refill takes less than 2 seconds.  Neurological:     Mental Status: He is alert.  Psychiatric:        Mood and Affect: Mood normal.     ED Course/ Medical Decision Making/ A&P  Procedures Procedures   Medications Ordered in ED Medications  lactated ringers  infusion ( Intravenous New Bag/Given 03/09/24 1556)  lactated ringers  bolus 1,000 mL (0 mLs Intravenous Stopped 03/09/24 1637)    And  lactated ringers  bolus 1,000 mL (0 mLs Intravenous Stopped 03/09/24 1637)    And  lactated ringers  bolus 600 mL (has no administration in time range)  vancomycin  (VANCOCIN ) 2,500 mg in sodium chloride  0.9 % 500 mL IVPB (has no administration in time range)  acetaminophen  (TYLENOL )  tablet 650 mg (has no administration in time range)  ceFEPIme  (MAXIPIME ) 2 g in sodium chloride  0.9 % 100 mL IVPB (0 g Intravenous Stopped 03/09/24 1637)  metroNIDAZOLE  (FLAGYL ) IVPB 500 mg (0 mg Intravenous Stopped 03/09/24 1800)    Medical Decision Making:   TORYN MCCLINTON is a 42 y.o. male who presents for syncope as per above.  Physical exam is pertinent for tachycardia, mild diaphoresis.   The differential includes but is not limited to hyperglycemia, DKA, HHS, sepsis, cardiogenic syncope.  Independent historian: EMS  External data reviewed: Labs: Reviewed prior labs for baseline  Labs: Ordered, Independent interpretation, and Details: CBC with slight leukocytosis to 12.1 with absolute neutrophilia.  No anemia or thrombocytopenia.  CMP with elevation of creatinine to 1.25, most recent prior a year ago WNL at  approximately baseline of 0.8, however patient does not have BUN elevation, given time gap between previous and current, suspect that patient may have developed CKD in the interim rather than this value representing AKI.  Lipase WNL. Actiq  acid elevated at 2.6.  Coags WNL  Radiology: Ordered, Independent interpretation, Details: Chest x-ray with increased opacification in the right perihilar area concerning for developing pneumonia.  No cardiomediastinal silhouette derangement, pneumothorax, pleural effusion, bony derangement, and All images reviewed independently.  Agree with radiology report at this time.    DG Chest Port 1 View Result Date: 03/09/2024 CLINICAL DATA:  Hyperglycemia. EXAM: PORTABLE CHEST 1 VIEW COMPARISON:  December 10, 2022 FINDINGS: The heart size and mediastinal contours are within normal limits. Low lung volumes are noted with mild areas of atelectasis and/or infiltrate seen within the bilateral lung bases. No pleural effusion or pneumothorax is identified. The visualized skeletal structures are unremarkable. IMPRESSION: Low lung volumes with mild bibasilar  atelectasis and/or infiltrate. Electronically Signed   By: Suzen Dials M.D.   On: 03/09/2024 15:52    EKG/Medicine tests: Ordered EKG Interpretation:    Interventions: 30 cc/kg LR bolus, vancomycin , cefepime , Flagyl   See the EMR for full details regarding lab and imaging results.  Patient presents for episode of exertional syncope with prodromal symptoms of lightheadedness and shortness of breath, patient did not have any chest pain, however do feel that troponin for further restratification is reasonable.  Patient without focal abnormalities on exam outside of tachycardia and mild diaphoresis, however on exam patient has grossly abnormal vitals consistent with sepsis with fever, low normotension, tachycardia, tachypnea.  Patient activated as a code sepsis with 30 cc/kg bolus as well as broad-spectrum antibiotics.  Patient does not have particular localizing symptoms on exam, however does have possible developing pneumonia on chest x-ray.  Plan at the time of signout, follow-up remaining labs including urine, COVID/flu, troponin, anticipate admission for sepsis.  Presentation is most consistent with acute complicated illness and Current presentation is complicated by underlying chronic conditions  Discussion of management or test interpretations with external provider(s): None by the time of handoff  Risk Drugs:Prescription drug management Treatment: Pending at the time of handoff, anticipate admission  Disposition: HANDOFF: At the time of signout, the patients labs had not yet been completed. I transferred care of the patient at the time of signout to Dr. Neysa. I informed the incoming care provider of the patient's history, status, and management plan. I addressed all of their concerns and/or questions to the best of my ability. Please refer to the incoming care provider's note for details regarding the remainder of the patient's ED course and disposition.  MDM generated using voice  dictation software and may contain dictation errors.  Please contact me for any clarification or with any questions.  Clinical Impression:  1. Sepsis, due to unspecified organism, unspecified whether acute organ dysfunction present (HCC)   2. Hyperglycemia      Data Unavailable   Final Clinical Impression(s) / ED Diagnoses Final diagnoses:  Sepsis, due to unspecified organism, unspecified whether acute organ dysfunction present [facility])  Hyperglycemia    Rx / DC Orders ED Discharge Orders     None        Rogelia Jerilynn RAMAN, MD 03/09/24 1805

## 2024-03-09 NOTE — ED Provider Notes (Signed)
 Received signout with pending labs, but will need admission for sepsis.  Does have a leukocytosis elevated lactate.  He has been started on empiric antibiotics.  Elevated glucose mildly low bicarbonate, however pH normal.  I added on x-ray of left foot as his left third toe is erythematous and he does have a ulcer on the bottom of his left foot as well.  I suspect this is a source of his infection.  Although chest x-ray could have right lower lobe pneumonia; but not having cough shortness of breath or dyspnea.  Will put patient up for admission at this time for sepsis.   Neysa Caron PARAS, DO 03/09/24 1840

## 2024-03-09 NOTE — ED Notes (Signed)
 Attempted to get EKG multiple times however 3 of the leads do not show up. The leads are connected, the stickers were exchanged, the wires. MD at bedside and aware of difficulty obtaining. Will get portable EKG machine.

## 2024-03-09 NOTE — Progress Notes (Signed)
 Pharmacy Antibiotic Note  [name] is a 43 y.o. male for which pharmacy has been consulted for cefepime  and vancomycin  dosing for sepsis of unknown source. Patient with a history of HTN, T2DM, DKA, HLD, prior osteomyelitis.   SCr 1 Wt 149.7 kg WBC 12.1; LA 1.6; T 102.9>99.3; HR 136>100; RR 23>13 COVID neg / flu neh  Plan: Metronidazole  per MD Cefepime  2g q8hr  Vancomycin  2500 mg once then 1750 mg q12hr (eAUC 477.6) unless change in renal function Monitor WBC, fever, renal function, cultures De-escalate when able Levels at steady state  Height: 6' 3 (190.5 cm) Weight: (!) 149.7 kg (330 lb) IBW/kg (Calculated) : 84.5  Temp (24hrs), Avg:101 F (38.3 C), Min:99.3 F (37.4 C), Max:102.9 F (39.4 C)  Recent Labs  Lab 03/09/24 1505 03/09/24 1758 03/09/24 1759 03/09/24 2143  WBC 12.1*  --   --   --   CREATININE 1.25*  --  1.00  --   LATICACIDVEN  --  2.6*  --  1.6    Estimated Creatinine Clearance: 147.5 mL/min (by C-G formula based on SCr of 1 mg/dL).    Allergies  Allergen Reactions   Milk-Related Compounds Diarrhea   Microbiology results: Pending  Thank you for allowing pharmacy to be a part of this patient's care.  Dorn Buttner, PharmD, BCPS 03/09/2024 10:49 PM ED Clinical Pharmacist -  [phone]

## 2024-03-09 NOTE — ED Notes (Addendum)
 Pt BP dropped to 92/53, MD made aware. Pt denies any dizziness. Says he feels fine. Bolus and additional labs drawn and started.

## 2024-03-09 NOTE — ED Notes (Signed)
 MD at bedside, reports wanting maintenance LR running at 200/hr after bolus is finished.check CBG and do not move pt to floor until BP is stabilized.

## 2024-03-09 NOTE — ED Notes (Signed)
 CCMD called to place pt on tele

## 2024-03-09 NOTE — ED Notes (Signed)
 Vancomycin  started late due to being unable to locate medication from main pharmacy. MD was at bedside when med was started

## 2024-03-09 NOTE — Sepsis Progress Note (Signed)
 Elink monitoring for the code sepsis protocol.

## 2024-03-09 NOTE — ED Triage Notes (Signed)
 Pt coming from home with bg of 446. Pt non-compliant with his insulin . Pt had a syncopal episode at home witnessed by mother.

## 2024-03-09 NOTE — H&P (Addendum)
 History and Physical    [name] FMW:[id number] DOB: [birth year] DOA: 03/09/2024  Patient coming from: Home.  Chief Complaint: Fever chills.  HPI: [name] is a 43 y.o. male with history of diabetes mellitus type 2 and depression presently not taking medications for last few months as patient states he is depressed and also was not able to afford some of them presents to the ER after patient was having fever and chills since morning.  Patient states he woke up this morning with fever and chills with no associated productive cough abdominal pain chest pain or diarrhea.  Following the episode he went for a nap when he woke up he was still feeling feverish with rigors and he went to the bathroom to urinate when he briefly lost consciousness.  He states that he felt very weak and probably lost consciousness due to that.  Patient also had 2-3 episodes of vomiting.  Due to persistent fever he called EMS and was brought to the ER.  ED Course: In the ER patient blood pressure was in the low normal was tachycardic in the 130s with temperature of 102.9 F presentation concerning for sepsis.  CT chest abdomen pelvis shows gallbladder stones but otherwise nothing acute.  CT head was unremarkable.  After the fall patient had right knee pain but x-rays did not show anything acute.  UA is negative for nitrates and leukocyte esterase with mild ketones.  Patient does have an ulcer on the plantar aspect of the left foot with some erythema on the lateral toes.  Source likely could be the foot.  Started on empiric antibiotics admitted for further workup.  Blood sugars were uncontrolled with blood sugar of 446 and anion gap of 16.  Venous blood gas showed pH of 7.38.  Patient was placed on Lantus  insulin  with sliding scale coverage.  Review of Systems: As per HPI, rest all negative.   Past Medical History:  Diagnosis Date   ADHD    Anxiety    Bipolar disorder (HCC)    Depression    Diabetes  mellitus without complication (HCC)    type 2   Heart murmur    as a child   HLD (hyperlipidemia)    Hypertension    no meds   Peripheral vascular disease     Past Surgical History:  Procedure Laterality Date   AMPUTATION Left 03/06/2020   Procedure: LEFT GREAT TOE AMPUTATION;  Surgeon: Harden Jerona GAILS, MD;  Location: [facility] OR;  Service: Orthopedics;  Laterality: Left;   AMPUTATION Right 10/15/2021   Procedure: RIGHT GREAT TOE AMPUTATION;  Surgeon: Harden Jerona GAILS, MD;  Location: [facility] OR;  Service: Orthopedics;  Laterality: Right;   BONE BIOPSY Left 02/25/2023   Procedure: LEFT FIFTH METATARSAL BONE BIOPSY;  Surgeon: Lennie Barter, DPM;  Location: [facility] ORS;  Service: Podiatry;  Laterality: Left;   EYE SURGERY Bilateral    lasik   TRANSMETATARSAL AMPUTATION Right 02/25/2023   Procedure: RIGHT PARTIAL FIRST RAY AMPUTATION WITH ANTIBIOTIC BEAD APPLICATION;  Surgeon: Lennie Barter, DPM;  Location: [facility] ORS;  Service: Podiatry;  Laterality: Right;   WISDOM TOOTH EXTRACTION       reports that he has never smoked. He has never used smokeless tobacco. He reports that he does not currently use alcohol. He reports that he does not use drugs.  Allergies  Allergen Reactions   Milk-Related Compounds Diarrhea    Family History  Problem Relation Age of Onset   Diabetes Mother  Hyperlipidemia Father    Hypertension Father     Prior to Admission medications   Medication Sig Start Date End Date Taking? Authorizing Provider  acetaminophen  (TYLENOL ) 325 MG tablet Take 2 tablets (650 mg total) by mouth every 6 (six) hours as needed for mild pain or moderate pain (or Fever >/= 101). Patient not taking: Reported on 04/06/2023 02/28/23   Alexander, Natalie, DO  acidophilus (RISAQUAD) CAPS capsule Take 1 capsule by mouth 3 (three) times daily. Patient not taking: Reported on 04/06/2023 02/28/23   Alexander, Natalie, DO  amoxicillin  (AMOXIL ) 875 MG tablet Take 1 tablet (875 mg total) by mouth 2 (two) times  daily. 08/12/23     atomoxetine  (STRATTERA ) 40 MG capsule Take 1 capsule (40 mg total) by mouth daily. 03/29/23   Ezzard Staci SAILOR, NP  atomoxetine  (STRATTERA ) 80 MG capsule Take 1 capsule (80 mg total) by mouth daily. 05/10/23   Ezzard Staci SAILOR, NP  blood glucose meter kit and supplies Dispense based on patient and insurance preference. Use up to four times daily as directed. (FOR ICD-10 E10.9, E11.9). 03/08/20   McQuilla, Jai B, MD  Continuous Glucose Receiver (DEXCOM G7 RECEIVER) DEVI Use as directed to check blood sugar. 04/06/23   Vicci Barnie NOVAK, MD  Continuous Glucose Sensor (DEXCOM G7 SENSOR) MISC Change sensor every 10 days 04/06/23   Vicci Barnie NOVAK, MD  insulin  aspart (NOVOLOG ) 100 UNIT/ML FlexPen Inject 5 Units into the skin 3 (three) times daily with meals. 02/28/23   Alexander, Natalie, DO  Insulin  Glargine (BASAGLAR  KWIKPEN) 100 UNIT/ML Inject 60 Units into the skin at bedtime. 01/21/23   Vicci Barnie NOVAK, MD  Insulin  Pen Needle (PEN NEEDLES) 31G X 8 MM MISC UAD 05/30/20   Vicci Barnie NOVAK, MD  metFORMIN  (GLUCOPHAGE -XR) 500 MG 24 hr tablet Take 4 tablets (2,000 mg total) by mouth daily with breakfast. 12/31/22   Vicci Barnie NOVAK, MD  mupirocin  ointment (BACTROBAN ) 2 % Apply 1 Application topically daily for 7 days. 03/08/23     pravastatin  (PRAVACHOL ) 40 MG tablet Take 1 tablet (40 mg total) by mouth once daily. 12/31/22   Vicci Barnie NOVAK, MD  senna-docusate (SENOKOT-S) 8.6-50 MG tablet Take 1 tablet by mouth at bedtime as needed for mild constipation. Patient not taking: Reported on 04/06/2023 02/28/23   Alexander, Natalie, DO  sertraline  (ZOLOFT ) 50 MG tablet Take 1 tablet (50 mg total) by mouth daily. 05/10/23   Ezzard Staci SAILOR, NP    Physical Exam: Constitutional: Moderately built and nourished. Vitals:   03/09/24 2200 03/09/24 2215 03/09/24 2220 03/09/24 2230  BP: (!) 91/51 (!) 99/56 107/66 101/66  Pulse: (!) 105 (!) 103 (!) 103 100  Resp:   13   Temp:      TempSrc:       SpO2: 99% 92% 98% 95%  Weight:      Height:       Eyes: Anicteric no pallor. ENMT: No discharge from the ears eyes nose or mouth. Neck: No mass felt.  No neck rigidity. Respiratory: No rhonchi or crepitations. Cardiovascular: S1-S2 heard. Abdomen: Soft nontender bowel sound present. Musculoskeletal: Left foot has mild erythema and an ulcer.  Has good pulses. Skin: Ulcer on the plantar aspect of the left foot with mild erythema. Neurologic: Alert awake oriented to time place and person.  Moves all extremities. Psychiatric: Appears normal.  Normal affect.   Labs on Admission: I have personally reviewed following labs and imaging studies  CBC: Recent Labs  Lab 03/09/24  1505 03/09/24 1758 03/09/24 1759  WBC 12.1*  --   --   NEUTROABS 10.5*  --   --   HGB 16.1 14.3 15.0  HCT 47.5 42.0 44.0  MCV 83.9  --   --   PLT 188  --   --    Basic Metabolic Panel: Recent Labs  Lab 03/09/24 1505 03/09/24 1758 03/09/24 1759  NA 134* 138 139  K 3.9 3.8 3.8  CL 98  --  102  CO2 20*  --   --   GLUCOSE 446*  --  427*  BUN 13  --  14  CREATININE 1.25*  --  1.00  CALCIUM  8.7*  --   --    GFR: Estimated Creatinine Clearance: 147.5 mL/min (by C-G formula based on SCr of 1 mg/dL). Liver Function Tests: Recent Labs  Lab 03/09/24 1505  AST 29  ALT 22  ALKPHOS 106  BILITOT 0.8  PROT 7.1  ALBUMIN 3.7   Recent Labs  Lab 03/09/24 1505  LIPASE 20   No results for input(s): AMMONIA in the last 168 hours. Coagulation Profile: Recent Labs  Lab 03/09/24 1505  INR 1.1   Cardiac Enzymes: No results for input(s): CKTOTAL, CKMB, CKMBINDEX, TROPONINI in the last 168 hours. BNP (last 3 results) No results for input(s): PROBNP in the last 8760 hours. HbA1C: No results for input(s): HGBA1C in the last 72 hours. CBG: Recent Labs  Lab 03/09/24 2220  GLUCAP 349*   Lipid Profile: No results for input(s): CHOL, HDL, LDLCALC, TRIG, CHOLHDL, LDLDIRECT in the  last 72 hours. Thyroid Function Tests: No results for input(s): TSH, T4TOTAL, FREET4, T3FREE, THYROIDAB in the last 72 hours. Anemia Panel: No results for input(s): VITAMINB12, FOLATE, FERRITIN, TIBC, IRON, RETICCTPCT in the last 72 hours. Urine analysis:    Component Value Date/Time   COLORURINE STRAW (A) 03/09/2024 1808   APPEARANCEUR CLEAR 03/09/2024 1808   LABSPEC 1.021 03/09/2024 1808   PHURINE 6.0 03/09/2024 1808   GLUCOSEU >=500 (A) 03/09/2024 1808   HGBUR NEGATIVE 03/09/2024 1808   BILIRUBINUR NEGATIVE 03/09/2024 1808   BILIRUBINUR negative 08/10/2016 1116   KETONESUR 20 (A) 03/09/2024 1808   PROTEINUR NEGATIVE 03/09/2024 1808   UROBILINOGEN 0.2 08/10/2016 1116   NITRITE NEGATIVE 03/09/2024 1808   LEUKOCYTESUR NEGATIVE 03/09/2024 1808   Sepsis Labs: @LABRCNTIP (procalcitonin:4,lacticidven:4) ) Recent Results (from the past 240 hours)  Resp panel by RT-PCR (RSV, Flu A&B, Covid) Anterior Nasal Swab     Status: None   Collection Time: 03/09/24  3:06 PM   Specimen: Anterior Nasal Swab  Result Value Ref Range Status   SARS Coronavirus 2 by RT PCR NEGATIVE NEGATIVE Final   Influenza A by PCR NEGATIVE NEGATIVE Final   Influenza B by PCR NEGATIVE NEGATIVE Final    Comment: (NOTE) The Xpert Xpress SARS-CoV-2/FLU/RSV plus assay is intended as an aid in the diagnosis of influenza from Nasopharyngeal swab specimens and should not be used as a sole basis for treatment. Nasal washings and aspirates are unacceptable for Xpert Xpress SARS-CoV-2/FLU/RSV testing.  Fact Sheet for Patients: BloggerCourse.com  Fact Sheet for Healthcare Providers: SeriousBroker.it  This test is not yet approved or cleared by the [place]  FDA and has been authorized for detection and/or diagnosis of SARS-CoV-2 by FDA under an Emergency Use Authorization (EUA). This EUA will remain in effect (meaning this test can be used) for  the duration of the COVID-19 declaration under Section 564(b)(1) of the Act, 21 U.S.C. section 360bbb-3(b)(1), unless the authorization  is terminated or revoked.     Resp Syncytial Virus by PCR NEGATIVE NEGATIVE Final    Comment: (NOTE) Fact Sheet for Patients: BloggerCourse.com  Fact Sheet for Healthcare Providers: SeriousBroker.it  This test is not yet approved or cleared by the [place]  FDA and has been authorized for detection and/or diagnosis of SARS-CoV-2 by FDA under an Emergency Use Authorization (EUA). This EUA will remain in effect (meaning this test can be used) for the duration of the COVID-19 declaration under Section 564(b)(1) of the Act, 21 U.S.C. section 360bbb-3(b)(1), unless the authorization is terminated or revoked.  Performed at [facility], [address] [place], [place] [address]      Radiological Exams on Admission: DG Knee 1-2 Views Right Result Date: 03/09/2024 EXAM: 2 VIEW(S) XRAY OF THE KNEE 03/09/2024 08:49:37 PM COMPARISON: None available. CLINICAL HISTORY: Knee pain FINDINGS: BONES AND JOINTS: No acute fracture. No focal osseous lesion. No joint dislocation. No significant joint effusion. Small patellar spurs at the quadriceps and patellar tendon insertions. Mild degenerative changes in the patellofemoral compartment. SOFT TISSUES: The soft tissues are unremarkable. IMPRESSION: 1. No acute findings. Electronically signed by: Pinkie Pebbles MD 03/09/2024 08:55 PM EDT RP Workstation: [id number]   DG Foot Complete Left Result Date: 03/09/2024 CLINICAL DATA:  sepsis EXAM: LEFT FOOT - COMPLETE 3+ VIEW COMPARISON:  02/18/2023 FINDINGS: Osteopenia.No acute fracture or dislocation. Prior amputation of the great toe. Similar cortical thickening of the second and third metatarsal shaft. Chronic changes of the fifth metatarsal head with osteophyte formation at the PIP joint. Moderate degenerative  changes of the second PIP joint. No radiopaque foreign body. No subcutaneous gas. IMPRESSION: Diffuse osteopenia. No subcutaneous gas or radiographic changes of osteomyelitis, at this time. Electronically Signed   By: Rogelia Myers M.D.   On: 03/09/2024 17:31   DG Chest Port 1 View Result Date: 03/09/2024 CLINICAL DATA:  Hyperglycemia. EXAM: PORTABLE CHEST 1 VIEW COMPARISON:  December 10, 2022 FINDINGS: The heart size and mediastinal contours are within normal limits. Low lung volumes are noted with mild areas of atelectasis and/or infiltrate seen within the bilateral lung bases. No pleural effusion or pneumothorax is identified. The visualized skeletal structures are unremarkable. IMPRESSION: Low lung volumes with mild bibasilar atelectasis and/or infiltrate. Electronically Signed   By: Suzen Dials M.D.   On: 03/09/2024 15:52     Assessment/Plan Principal Problem:   Sepsis (HCC) Active Problems:   Major depressive disorder, recurrent episode, moderate (HCC)   Uncontrolled type 2 diabetes mellitus with hyperglycemia (HCC)    Sepsis source not clear suspect likely could be the foot.  I did order MRI of the left foot.  Follow cultures.  At presentation patient was febrile tachycardic hypotensive concerning for sepsis.  Continue empiric antibiotics. Uncontrolled diabetes mellitus type 2  -   patient used to be on Lantus  and has not taken it for many months now due to financial issues and also depression.  I have placed patient on Lantus  20 units with premeals insulin  and sliding scale coverage.  Based on his CBGs may have go up on the insulin  dose.  Check hemoglobin A1c.  Recheck metabolic panel to make sure patient did not go into DKA. Acute renal failure likely from dehydration for follow-up metabolic panel after hydration. Syncope likely from dehydration.  Regain consciousness within a few seconds and felt weak while the episode happened in a standing position.  Check 2D echo and  EKG. Depression has not taken his medicine for many  months.  Will consult psychiatry.  Denies any suicidal thoughts. Cholelithiasis seen on CAT scan abdomen appears benign on exam.  Since patient has sepsis will need close monitoring further workup and more than 2 midnight stay.   DVT prophylaxis: Lovenox . Code Status: Full code. Family Communication: Discussed with patient. Disposition Plan: Progressive care. Consults called: Psychiatry. Admission status: Inpatient.

## 2024-03-09 NOTE — ED Notes (Signed)
 Pt taken for CT, BP has been stable for multiple readings.

## 2024-03-09 NOTE — ED Notes (Signed)
CBG - 349 

## 2024-03-09 NOTE — ED Notes (Addendum)
 Pt was supposed to be taken for CT, upon check of BP prior to leaving for scans. BP 74/54. Pt continues to deny any symptoms. BP cuff repositioned and retaken, BP 91/51. Pt to receive bolus and BP stabilize prior to scan. MD aware

## 2024-03-10 ENCOUNTER — Inpatient Hospital Stay (HOSPITAL_COMMUNITY): Payer: Self-pay

## 2024-03-10 DIAGNOSIS — R55 Syncope and collapse: Secondary | ICD-10-CM

## 2024-03-10 DIAGNOSIS — R6521 Severe sepsis with septic shock: Secondary | ICD-10-CM

## 2024-03-10 LAB — CBC
HCT: 43.1 % (ref 39.0–52.0)
Hemoglobin: 14.1 g/dL (ref 13.0–17.0)
MCH: 28.3 pg (ref 26.0–34.0)
MCHC: 32.7 g/dL (ref 30.0–36.0)
MCV: 86.4 fL (ref 80.0–100.0)
Platelets: 156 K/uL (ref 150–400)
RBC: 4.99 MIL/uL (ref 4.22–5.81)
RDW: 13.2 % (ref 11.5–15.5)
WBC: 10 K/uL (ref 4.0–10.5)
nRBC: 0 % (ref 0.0–0.2)

## 2024-03-10 LAB — COMPREHENSIVE METABOLIC PANEL WITH GFR
ALT: 19 U/L (ref 0–44)
AST: 20 U/L (ref 15–41)
Albumin: 3 g/dL — ABNORMAL LOW (ref 3.5–5.0)
Alkaline Phosphatase: 81 U/L (ref 38–126)
Anion gap: 11 (ref 5–15)
BUN: 12 mg/dL (ref 6–20)
CO2: 20 mmol/L — ABNORMAL LOW (ref 22–32)
Calcium: 8.3 mg/dL — ABNORMAL LOW (ref 8.9–10.3)
Chloride: 105 mmol/L (ref 98–111)
Creatinine, Ser: 0.94 mg/dL (ref 0.61–1.24)
GFR, Estimated: >60 mL/min (ref >60)
Glucose, Bld: 340 mg/dL — ABNORMAL HIGH (ref 70–99)
Potassium: 3.7 mmol/L (ref 3.5–5.1)
Sodium: 136 mmol/L (ref 135–145)
Total Bilirubin: 1.1 mg/dL (ref 0.0–1.2)
Total Protein: 6.1 g/dL — ABNORMAL LOW (ref 6.5–8.1)

## 2024-03-10 LAB — HEMOGLOBIN A1C
Hgb A1c MFr Bld: 12.9 % — ABNORMAL HIGH (ref 4.8–5.6)
Mean Plasma Glucose: 323.53 mg/dL

## 2024-03-10 LAB — TSH: TSH: 0.956 u[IU]/mL (ref 0.350–4.500)

## 2024-03-10 LAB — LACTIC ACID, PLASMA: Lactic Acid, Venous: 1.9 mmol/L (ref 0.5–1.9)

## 2024-03-10 LAB — ECHOCARDIOGRAM COMPLETE
Area-P 1/2: 3.83 cm2
Height: 75 in
S' Lateral: 3 cm
Weight: 5280 [oz_av]

## 2024-03-10 LAB — CBG MONITORING, ED
Glucose-Capillary: 322 mg/dL — ABNORMAL HIGH (ref 70–99)
Glucose-Capillary: 260 mg/dL — ABNORMAL HIGH (ref 70–99)

## 2024-03-10 LAB — GLUCOSE, CAPILLARY
Glucose-Capillary: 256 mg/dL — ABNORMAL HIGH (ref 70–99)
Glucose-Capillary: 268 mg/dL — ABNORMAL HIGH (ref 70–99)

## 2024-03-10 LAB — HIV ANTIBODY (ROUTINE TESTING W REFLEX): HIV Screen 4th Generation wRfx: NONREACTIVE

## 2024-03-10 MED ORDER — SERTRALINE HCL 50 MG PO TABS
25.0000 mg | ORAL_TABLET | Freq: Every day | ORAL | Status: DC
  Administered 2024-03-10 – 2024-03-16 (×7): 25 mg via ORAL
  Filled 2024-03-10 (×7): qty 1

## 2024-03-10 MED ORDER — PERFLUTREN LIPID MICROSPHERE
1.0000 mL | INTRAVENOUS | Status: AC | PRN
  Administered 2024-03-10: 4 mL via INTRAVENOUS

## 2024-03-10 NOTE — Inpatient Diabetes Management (Addendum)
 Inpatient Diabetes Program Recommendations  AACE/ADA: New Consensus Statement on Inpatient Glycemic Control (2015)  Target Ranges:  Prepandial:   less than 140 mg/dL      Peak postprandial:   less than 180 mg/dL (1-2 hours)      Critically ill patients:  140 - 180 mg/dL   Lab Results  Component Value Date   GLUCAP 322 (H) 03/10/2024   HGBA1C 12.9 (H) 03/10/2024    Review of Glycemic Control  Latest Reference Range & Units 03/09/24 22:20 03/10/24 07:50  Glucose-Capillary 70 - 99 mg/dL 650 (H) 677 (H)   Diabetes history: DM 2 Outpatient Diabetes medications: Basaglar  60 units qhs, Novolog  5 units tid, Metformin  2000 mg Daily Current orders for Inpatient glycemic control:  Semglee  20 units qhs Novolog  0-15 units tid  Novolog  4 units tid meal coverage  Inpatient Diabetes Program Recommendations:    -   Increase Semglee  to 30 units (1/2 home dose)  No insurance Depressed not taking meds Financial concerns Depression will need to be addressed to optimize glucose control outpatient  Will see and follow up with pt today Will need CM/SW consult for medication assistance Follow up with PCP  Addendum 9:50 am: Spoke with pt at bedside. Pt has been without medication for approx 1 year due to confusion with insurance and coverage. Pt never followed up with insurance coverage. Pt reports going to the clinic to a PCP on the third floor of the [facility]. Pt also reports being on some program to get his medications for $4 thinking it was called an orange card. Pt was on Trulicity  with wt loss success but unable to stay on it due to cost. Discussed the need for his medications and informed him about the OTC insulin  at [facility] and for him to speak with his PCP regarding a back up plan in case this were to happen again. Pt also reports being on a CGM in the past, however, the device only stayed on for 3 days. Pt reporting sweating a lot. Pt will need all needed supplies at  time of discharge. May need match and then clinic follow up for assistance in filling out documentation for medication assistance program. During this time pt will also need to inquire about his insurance status.  Thanks,  Clotilda Bull RN, MSN, BC-ADM Inpatient Diabetes Coordinator Team Pager [phone] (8a-5p)

## 2024-03-10 NOTE — Progress Notes (Signed)
 PROGRESS NOTE    [name]  FMW:[id number] DOB: 15-Jun-1980 DOA: 03/09/2024 PCP: Vicci Barnie NOVAK, MD  Outpatient Specialists:     Brief Narrative:  As per H&P done on admission: [name] is a 43 y.o. male with history of diabetes mellitus type 2 and depression presently not taking medications for last few months as patient states he is depressed and also was not able to afford some of them presents to the ER after patient was having fever and chills since morning.  Patient states he woke up this morning with fever and chills with no associated productive cough abdominal pain chest pain or diarrhea.  Following the episode he went for a nap when he woke up he was still feeling feverish with rigors and he went to the bathroom to urinate when he briefly lost consciousness.  He states that he felt very weak and probably lost consciousness due to that.  Patient also had 2-3 episodes of vomiting.  Due to persistent fever he called EMS and was brought to the ER.   ED Course: In the ER patient blood pressure was in the low normal was tachycardic in the 130s with temperature of 102.9 F presentation concerning for sepsis.  CT chest abdomen pelvis shows gallbladder stones but otherwise nothing acute.  CT head was unremarkable.  After the fall patient had right knee pain but x-rays did not show anything acute.  UA is negative for nitrates and leukocyte esterase with mild ketones.  Patient does have an ulcer on the plantar aspect of the left foot with some erythema on the lateral toes.  Source likely could be the foot.  Started on empiric antibiotics admitted for further workup.  Blood sugars were uncontrolled with blood sugar of 446 and anion gap of 16.  Venous blood gas showed pH of 7.38.  Patient was placed on Lantus  insulin  with sliding scale coverage.  03/10/2024: Patient seen alongside patient's mother.  Patient reports that he has had the left foot ulcer for over 1 year.  Psychiatry  input is appreciated.  Continue antibiotics for now.  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   Assessment & Plan:   Principal Problem:   Sepsis (HCC) Active Problems:   Uncontrolled type 2 diabetes mellitus with hyperglycemia (HCC)   Major depressive disorder, recurrent episode, moderate (HCC)   Sepsis:  -Likely secondary to infected left foot ulcer. - Patient has had the ulcer for greater than 1 year. 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 - Follow cultures. - Patient is currently on IV vancomycin , cefepime  and Flagyl   Uncontrolled diabetes mellitus type 2: -A1c of 12.9%. - Patient has been noncompliant. - Consult diabetic educator.    -Patient used to be on Lantus  20 units and has not taken it for many months now due to financial issues and also depression.  - Continue subcutaneous Semglee  20 units every night, subcutaneous NovoLog  4 units with meals 3 times daily and sliding scale insulin  coverage.   Acute renal failure: - Likely prerenal. - Resolved with hydration.  Syncope: - Likely from dehydration.  Regain consciousness within a few seconds and felt weak while the episode happened in a standing position.   -Echo is not revealing.    Depression: - Psychiatric input is appreciated.   -- Resume sertraline .  Cholelithiasis:  DVT prophylaxis: Lovenox  Code Status: Full code Family Communication: Mother Disposition Plan: Patient remains inpatient for now.   Consultants:  Psychiatry Have low threshold to consult  infectious disease and orthopedic surgery.  Procedures:  None.  Antimicrobials:  IV vancomycin . IV cefepime .   Subjective: No new complaints  Objective: Vitals:   03/10/24 1515 03/10/24 1530 03/10/24 1545 03/10/24 1635  BP: 109/67 116/75 121/73 (!) 141/89  Pulse: 90 89 89 89  Resp:    18  Temp:    98.6 F (37 C)  TempSrc:    Oral  SpO2: 98% 98% 98% 99%  Weight:      Height:        Intake/Output Summary (Last 24 hours)  at 03/10/2024 1841 Last data filed at 03/10/2024 0403 Gross per 24 hour  Intake 100 ml  Output 900 ml  Net -800 ml   Filed Weights   03/09/24 1501  Weight: (!) 149.7 kg    Examination:  General exam: Appears calm and comfortable.  Patient is morbidly obese. Respiratory system: Clear to auscultation. Respiratory effort normal. Cardiovascular system: S1 & S2 heard Gastrointestinal system: Abdomen is obese, soft and nontender.   Central nervous system: Awake and alert.   Extremities: Left foot ulcer.  Data Reviewed: I have personally reviewed following labs and imaging studies  CBC: Recent Labs  Lab 03/09/24 1505 03/09/24 1758 03/09/24 1759 03/10/24 0110  WBC 12.1*  --   --  10.0  NEUTROABS 10.5*  --   --   --   HGB 16.1 14.3 15.0 14.1  HCT 47.5 42.0 44.0 43.1  MCV 83.9  --   --  86.4  PLT 188  --   --  156   Basic Metabolic Panel: Recent Labs  Lab 03/09/24 1505 03/09/24 1758 03/09/24 1759 03/10/24 0110  NA 134* 138 139 136  K 3.9 3.8 3.8 3.7  CL 98  --  102 105  CO2 20*  --   --  20*  GLUCOSE 446*  --  427* 340*  BUN 13  --  14 12  CREATININE 1.25*  --  1.00 0.94  CALCIUM  8.7*  --   --  8.3*   GFR: Estimated Creatinine Clearance: 156.9 mL/min (by C-G formula based on SCr of 0.94 mg/dL). Liver Function Tests: Recent Labs  Lab 03/09/24 1505 03/10/24 0110  AST 29 20  ALT 22 19  ALKPHOS 106 81  BILITOT 0.8 1.1  PROT 7.1 6.1*  ALBUMIN 3.7 3.0*   Recent Labs  Lab 03/09/24 1505  LIPASE 20   No results for input(s): AMMONIA in the last 168 hours. Coagulation Profile: Recent Labs  Lab 03/09/24 1505  INR 1.1   Cardiac Enzymes: No results for input(s): CKTOTAL, CKMB, CKMBINDEX, TROPONINI in the last 168 hours. BNP (last 3 results) No results for input(s): PROBNP in the last 8760 hours. HbA1C: Recent Labs    03/10/24 0110  HGBA1C 12.9*   CBG: Recent Labs  Lab 03/09/24 2220 03/10/24 0750 03/10/24 1151 03/10/24 1628  GLUCAP  349* 322* 260* 256*   Lipid Profile: No results for input(s): CHOL, HDL, LDLCALC, TRIG, CHOLHDL, LDLDIRECT in the last 72 hours. Thyroid Function Tests: Recent Labs    03/10/24 0507  TSH 0.956   Anemia Panel: No results for input(s): VITAMINB12, FOLATE, FERRITIN, TIBC, IRON, RETICCTPCT in the last 72 hours. Urine analysis:    Component Value Date/Time   COLORURINE STRAW (A) 03/09/2024 1808   APPEARANCEUR CLEAR 03/09/2024 1808   LABSPEC 1.021 03/09/2024 1808   PHURINE 6.0 03/09/2024 1808   GLUCOSEU >=500 (A) 03/09/2024 1808   HGBUR NEGATIVE 03/09/2024 1808   BILIRUBINUR NEGATIVE 03/09/2024 1808  BILIRUBINUR negative 08/10/2016 1116   KETONESUR 20 (A) 03/09/2024 1808   PROTEINUR NEGATIVE 03/09/2024 1808   UROBILINOGEN 0.2 08/10/2016 1116   NITRITE NEGATIVE 03/09/2024 1808   LEUKOCYTESUR NEGATIVE 03/09/2024 1808   Sepsis Labs: @LABRCNTIP (procalcitonin:4,lacticidven:4)  ) Recent Results (from the past 240 hours)  Resp panel by RT-PCR (RSV, Flu A&B, Covid) Anterior Nasal Swab     Status: None   Collection Time: 03/09/24  3:06 PM   Specimen: Anterior Nasal Swab  Result Value Ref Range Status   SARS Coronavirus 2 by RT PCR NEGATIVE NEGATIVE Final   Influenza A by PCR NEGATIVE NEGATIVE Final   Influenza B by PCR NEGATIVE NEGATIVE Final    Comment: (NOTE) The Xpert Xpress SARS-CoV-2/FLU/RSV plus assay is intended as an aid in the diagnosis of influenza from Nasopharyngeal swab specimens and should not be used as a sole basis for treatment. Nasal washings and aspirates are unacceptable for Xpert Xpress SARS-CoV-2/FLU/RSV testing.  Fact Sheet for Patients: BloggerCourse.com  Fact Sheet for Healthcare Providers: SeriousBroker.it  This test is not yet approved or cleared by the [place]  FDA and has been authorized for detection and/or diagnosis of SARS-CoV-2 by FDA under an Emergency Use  Authorization (EUA). This EUA will remain in effect (meaning this test can be used) for the duration of the COVID-19 declaration under Section 564(b)(1) of the Act, 21 U.S.C. section 360bbb-3(b)(1), unless the authorization is terminated or revoked.     Resp Syncytial Virus by PCR NEGATIVE NEGATIVE Final    Comment: (NOTE) Fact Sheet for Patients: BloggerCourse.com  Fact Sheet for Healthcare Providers: SeriousBroker.it  This test is not yet approved or cleared by the [place]  FDA and has been authorized for detection and/or diagnosis of SARS-CoV-2 by FDA under an Emergency Use Authorization (EUA). This EUA will remain in effect (meaning this test can be used) for the duration of the COVID-19 declaration under Section 564(b)(1) of the Act, 21 U.S.C. section 360bbb-3(b)(1), unless the authorization is terminated or revoked.  Performed at [name], [address], [place], [place] [address]   Culture, blood (Routine x 2)     Status: None (Preliminary result)   Collection Time: 03/09/24  3:39 PM   Specimen: BLOOD RIGHT HAND  Result Value Ref Range Status   Specimen Description BLOOD RIGHT HAND  Final   Special Requests   Final    BOTTLES DRAWN AEROBIC AND ANAEROBIC Blood Culture results may not be optimal due to an inadequate volume of blood received in culture bottles   Culture   Final    NO GROWTH < 24 HOURS Performed at [facility], [address], [place], [place] [address]    Report Status PENDING  Incomplete  Culture, blood (Routine x 2)     Status: None (Preliminary result)   Collection Time: 03/09/24  7:27 PM   Specimen: BLOOD LEFT HAND  Result Value Ref Range Status   Specimen Description BLOOD LEFT HAND  Final   Special Requests   Final    BOTTLES DRAWN AEROBIC ONLY Blood Culture results may not be optimal due to an inadequate volume of blood received in culture bottles   Culture   Final    NO  GROWTH < 12 HOURS Performed at [facility], [address] [place], [place] [address]    Report Status PENDING  Incomplete         Radiology Studies: ECHOCARDIOGRAM COMPLETE Result Date: 03/10/2024    ECHOCARDIOGRAM REPORT  Patient Name:   [name] Date of Exam: 03/10/2024 Medical Rec #:  [id number]           Height:       75.0 in Accession #:    [id number]          Weight:       330.0 lb Date of Birth:  November 14, 1980           BSA:          2.715 m Patient Age:    42 years            BP:           93/56 mmHg Patient Gender: M                   HR:           93 bpm. Exam Location:  Inpatient Procedure: 2D Echo, Cardiac Doppler, Color Doppler and Intracardiac            Opacification Agent (Both Spectral and Color Flow Doppler were            utilized during procedure). Indications:    Syncope R55  History:        Patient has no prior history of Echocardiogram examinations.                 Signs/Symptoms:Murmur; Risk Factors:Diabetes, Hypertension,                 Dyslipidemia and Sleep Apnea.  Sonographer:    Koleen Popper RDCS Referring Phys: [id number] REDIA SAILOR [facility]  Sonographer Comments: Patient is obese. Image acquisition challenging due to patient body habitus. IMPRESSIONS  1. Left ventricular ejection fraction, by estimation, is 70 to 75%. The left ventricle has hyperdynamic function. The left ventricle has no regional wall motion abnormalities. There is mild concentric left ventricular hypertrophy. Left ventricular diastolic parameters were normal.  2. Right ventricular systolic function is normal. The right ventricular size is normal. Tricuspid regurgitation signal is inadequate for assessing PA pressure.  3. The mitral valve is normal in structure. No evidence of mitral valve regurgitation. No evidence of mitral stenosis.  4. Possible bicuspid aortic valve. The aortic valve was not well visualized. Unable to determine aortic valve morphology due to image quality. Aortic valve  regurgitation is not visualized. Aortic valve sclerosis is present, with no evidence of aortic valve stenosis.  5. The inferior vena cava is dilated in size with >50% respiratory variability, suggesting right atrial pressure of 8 mmHg. Comparison(s): No prior Echocardiogram. FINDINGS  Left Ventricle: Left ventricular ejection fraction, by estimation, is 70 to 75%. The left ventricle has hyperdynamic function. The left ventricle has no regional wall motion abnormalities. Definity contrast agent was given IV to delineate the left ventricular endocardial borders. The left ventricular internal cavity size was normal in size. There is mild concentric left ventricular hypertrophy. Left ventricular diastolic parameters were normal. Right Ventricle: The right ventricular size is normal. No increase in right ventricular wall thickness. Right ventricular systolic function is normal. Tricuspid regurgitation signal is inadequate for assessing PA pressure. Left Atrium: Left atrial size was normal in size. Right Atrium: Grossly normal in size. Right atrial size was not assessed. Pericardium: There is no evidence of pericardial effusion. Mitral Valve: The mitral valve is normal in structure. No evidence of mitral valve regurgitation. No evidence of mitral valve stenosis. Tricuspid Valve: The tricuspid valve is normal in structure. Tricuspid valve regurgitation is not demonstrated.  No evidence of tricuspid stenosis. Aortic Valve: Possible bicuspid aortic valve. The aortic valve was not well visualized. Aortic valve regurgitation is not visualized. Aortic valve sclerosis is present, with no evidence of aortic valve stenosis. Pulmonic Valve: The pulmonic valve was normal in structure. Pulmonic valve regurgitation is not visualized. No evidence of pulmonic stenosis. Aorta: The aortic root and ascending aorta are structurally normal, with no evidence of dilitation. Venous: The inferior vena cava is dilated in size with greater than 50%  respiratory variability, suggesting right atrial pressure of 8 mmHg. IAS/Shunts: No atrial level shunt detected by color flow Doppler.  LEFT VENTRICLE PLAX 2D LVIDd:         5.00 cm   Diastology LVIDs:         3.00 cm   LV e' medial:    10.90 cm/s LV PW:         1.10 cm   LV E/e' medial:  8.8 LV IVS:        1.10 cm   LV e' lateral:   10.70 cm/s LVOT diam:     2.25 cm   LV E/e' lateral: 8.9 LV SV:         70 LV SV Index:   26 LVOT Area:     3.98 cm  RIGHT VENTRICLE             IVC RV S prime:     11.60 cm/s  IVC diam: 2.60 cm TAPSE (M-mode): 2.5 cm LEFT ATRIUM             Index LA diam:        4.60 cm 1.69 cm/m LA Vol (A2C):   47.3 ml 17.42 ml/m LA Vol (A4C):   52.1 ml 19.19 ml/m LA Biplane Vol: 55.1 ml 20.30 ml/m  AORTIC VALVE LVOT Vmax:   98.00 cm/s LVOT Vmean:  64.500 cm/s LVOT VTI:    0.175 m  AORTA Ao Root diam: 3.00 cm Ao Asc diam:  3.40 cm MITRAL VALVE MV Area (PHT): 3.83 cm    SHUNTS MV Decel Time: 198 msec    Systemic VTI:  0.18 m MV E velocity: 95.40 cm/s  Systemic Diam: 2.25 cm MV A velocity: 61.80 cm/s MV E/A ratio:  1.54 Emeline Calender Electronically signed by Emeline Calender Signature Date/Time: 03/10/2024/6:25:07 PM    Final    MR FOOT LEFT WO CONTRAST Result Date: 03/10/2024 CLINICAL DATA:  Soft tissue infection of the left foot EXAM: MRI OF THE LEFT FOOT WITHOUT CONTRAST TECHNIQUE: Multiplanar, multisequence MR imaging of the left foot from the Lisfranc joint through the toes was performed. No intravenous contrast was administered. COMPARISON:  Radiographs 03/09/2024 FINDINGS: Despite efforts by the technologist and patient, motion artifact is present on today's exam and could not be eliminated. This reduces exam sensitivity and specificity. 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 Abnormal flattening of the second metatarsal head potentially from Freiberg's disease or  erosive arthropathy, with similar flattening and irregularity along the base of the proximal phalanx second toe. Erosive arthropathy in the head of the fifth metatarsal and in the proximal phalangeal base of the fifth toe in a pattern which can be seen in the setting of psoriatic arthritis, systemic sclerosis, rheumatoid arthritis, or reactive arthritis. Truncated tuft of the distal phalanx second toe with low T1 signal in the distal phalanx, chronic osteomyelitis not excluded. The only  unusual feature with regard osteomyelitis is the lack of obvious STIR signal. Truncated soft tissues along the distal-medial tip of the second toe overlying the distal phalanx. Accentuated T2 signal in the distal phalanx third toe for example on image 18 series 7. Although the region is partially obscured by motion artifact, osteomyelitis of the distal phalanx third toe is not excluded. Ligaments Lisfranc ligament intact. Muscles and Tendons Regional muscular atrophy. Soft tissues Plantar ulceration below the fourth MTP joint with cutaneous thickening and irregularity and underlying subcutaneous edema. Possible cutaneous wound with subcutaneous edema lateral to the fifth MTP joint. IMPRESSION: 1. Plantar ulceration below the fourth MTP joint with cutaneous thickening and irregularity and underlying subcutaneous edema. Possible cutaneous wound with subcutaneous edema lateral to the fifth MTP joint. 2. Truncated tuft of the distal phalanx second toe with low T1 signal in the distal phalanx, chronic osteomyelitis not excluded. The only unusual feature with regard to osteomyelitis is the lack of obvious STIR signal. 3. Accentuated T2 signal in the distal phalanx third toe. Although the region is partially obscured by motion artifact, osteomyelitis of the distal phalanx third toe is not excluded. 4. Erosive arthropathy in the head of the fifth metatarsal and in the proximal phalangeal base of the fifth toe in a pattern which can be seen  in the setting of psoriatic arthritis, systemic sclerosis, rheumatoid arthritis, or reactive arthritis. 5. 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 6. Abnormal flattening of the second metatarsal head potentially from Freiberg's disease or erosive arthropathy, with similar flattening and irregularity along the base of the proximal phalanx second toe. Electronically Signed   By: Ryan Salvage M.D.   On: 03/10/2024 09:00   CT HEAD WO CONTRAST ( ) Result Date: 03/10/2024 EXAM: CT HEAD WITHOUT CONTRAST 03/10/2024 12:07:14 AM TECHNIQUE: CT of the head was performed without the administration of intravenous contrast. Automated exposure control, iterative reconstruction, and/or weight based adjustment of the mA/kV was utilized to reduce the radiation dose to as low as reasonably achievable. COMPARISON: None available. CLINICAL HISTORY: fall. Pt coming from home with bg of 446. Pt non-compliant with his insulin . Pt had a syncopal episode at home witnessed by mother. FINDINGS: BRAIN AND VENTRICLES: No acute hemorrhage. No evidence of acute infarct. No hydrocephalus. No extra-axial collection. No mass effect or midline shift. ORBITS: No acute abnormality. SINUSES: No acute abnormality. SOFT TISSUES AND SKULL: No acute soft tissue abnormality. No skull fracture. IMPRESSION: 1. No acute intracranial abnormality. Electronically signed by: Pinkie Pebbles MD 03/10/2024 12:12 AM EDT RP Workstation: [id number]   CT CHEST ABDOMEN PELVIS W CONTRAST Result Date: 03/10/2024 EXAM: CT CHEST, ABDOMEN AND PELVIS WITH CONTRAST 03/10/2024 12:07:14 AM TECHNIQUE: CT of the chest, abdomen and pelvis was performed with the administration of 100 mL of iohexol (OMNIPAQUE) 350 MG/ML injection. Multiplanar reformatted images are provided for review. Automated exposure control, iterative reconstruction, and/or weight based adjustment of the mA/kV was utilized to reduce the radiation dose to as low as reasonably achievable.  COMPARISON: None available. CLINICAL HISTORY: Abdominal pain, acute, nonlocalized. Pt coming from home with bg of 446. Pt non-compliant with his insulin . Pt had a syncopal episode at home witnessed by mother. FINDINGS: CHEST: MEDIASTINUM AND LYMPH NODES: Heart and pericardium are unremarkable. The central airways are clear. No mediastinal, hilar or axillary lymphadenopathy. LUNGS AND PLEURA: Mild bilateral lower lobe atelectasis. No focal consolidation or pulmonary edema. No pleural effusion or pneumothorax. ABDOMEN AND PELVIS: LIVER: The liver is unremarkable. GALLBLADDER AND BILE  DUCTS: Layering gallstones (image 69), without associated inflammatory changes. No biliary ductal dilatation. SPLEEN: No acute abnormality. PANCREAS: No acute abnormality. ADRENAL GLANDS: No acute abnormality. KIDNEYS, URETERS AND BLADDER: No stones in the kidneys or ureters. No hydronephrosis. No perinephric or periureteral stranding. Urinary bladder is unremarkable. GI AND BOWEL: Stomach demonstrates no acute abnormality. Mild to moderate colonic stool burden. Normal appendix (image 109). There is no bowel obstruction. REPRODUCTIVE ORGANS: The prostate is unremarkable. PERITONEUM AND RETROPERITONEUM: No ascites. No free air. VASCULATURE: Aorta is normal in caliber. ABDOMINAL AND PELVIS LYMPH NODES: No lymphadenopathy. BONES AND SOFT TISSUES: Mild degenerative changes of the thoracolumbar spine. No focal soft tissue abnormality. IMPRESSION: 1. No acute findings. 2. Cholelithiasis, without inflammatory changes. Electronically signed by: Pinkie Pebbles MD 03/10/2024 12:11 AM EDT RP Workstation: [id number]   DG Knee 1-2 Views Right Result Date: 03/09/2024 EXAM: 2 VIEW(S) XRAY OF THE KNEE 03/09/2024 08:49:37 PM COMPARISON: None available. CLINICAL HISTORY: Knee pain FINDINGS: BONES AND JOINTS: No acute fracture. No focal osseous lesion. No joint dislocation. No significant joint effusion. Small patellar spurs at the quadriceps and  patellar tendon insertions. Mild degenerative changes in the patellofemoral compartment. SOFT TISSUES: The soft tissues are unremarkable. IMPRESSION: 1. No acute findings. Electronically signed by: Pinkie Pebbles MD 03/09/2024 08:55 PM EDT RP Workstation: [id number]   DG Foot Complete Left Result Date: 03/09/2024 CLINICAL DATA:  sepsis EXAM: LEFT FOOT - COMPLETE 3+ VIEW COMPARISON:  02/18/2023 FINDINGS: Osteopenia.No acute fracture or dislocation. Prior amputation of the great toe. Similar cortical thickening of the second and third metatarsal shaft. Chronic changes of the fifth metatarsal head with osteophyte formation at the PIP joint. Moderate degenerative changes of the second PIP joint. No radiopaque foreign body. No subcutaneous gas. IMPRESSION: Diffuse osteopenia. No subcutaneous gas or radiographic changes of osteomyelitis, at this time. Electronically Signed   By: Rogelia Myers M.D.   On: 03/09/2024 17:31   DG Chest Port 1 View Result Date: 03/09/2024 CLINICAL DATA:  Hyperglycemia. EXAM: PORTABLE CHEST 1 VIEW COMPARISON:  December 10, 2022 FINDINGS: The heart size and mediastinal contours are within normal limits. Low lung volumes are noted with mild areas of atelectasis and/or infiltrate seen within the bilateral lung bases. No pleural effusion or pneumothorax is identified. The visualized skeletal structures are unremarkable. IMPRESSION: Low lung volumes with mild bibasilar atelectasis and/or infiltrate. Electronically Signed   By: Suzen Dials M.D.   On: 03/09/2024 15:52        Scheduled Meds:  heparin   5,000 Units Subcutaneous Q8H   insulin  aspart  0-15 Units Subcutaneous TID WC   insulin  aspart  4 Units Subcutaneous TID WC   insulin  glargine-yfgn  20 Units Subcutaneous QHS   sertraline   25 mg Oral Daily   Continuous Infusions:  ceFEPime  (MAXIPIME ) IV 2 g (03/10/24 1659)   metronidazole  500 mg (03/10/24 1837)   vancomycin  Stopped (03/10/24 1216)     LOS: 1 day    Time  spent: 55 minutes    Leatrice Chapel, MD  [facility] Pager #: [phone] 7PM-7AM contact night coverage as above

## 2024-03-10 NOTE — Plan of Care (Signed)

## 2024-03-10 NOTE — Progress Notes (Signed)
  Echocardiogram 2D Echocardiogram has been performed.  Koleen KANDICE Popper, RDCS 03/10/2024, 11:43 AM

## 2024-03-10 NOTE — ED Notes (Signed)
 Pt back from CT and hooked back up to the monitor and fluids.

## 2024-03-10 NOTE — ED Notes (Signed)
 Lab called, lav and lt green tubes that were sent down clotted. Phlebotomy messaged about getting labs as pt was difficult stick.

## 2024-03-10 NOTE — ED Notes (Signed)
 Pt back from CT and hooked back up to fluids and the monitor

## 2024-03-10 NOTE — Consult Note (Signed)
 [facility] Psychiatric Consult Initial  Patient Name: .[name]  MRN: [id number]  DOB: 1981/03/22  Consult Order details:  Orders (From admission, onward)     Start     Ordered   03/10/24 0349  IP CONSULT TO PSYCHIATRY       Ordering Provider: Franky Redia SAILOR, MD  Provider:  (Not yet assigned)  Question Answer Comment  Location [facility]   Reason for Consult? Please consult for depression.  Denies suicidal thoughts but has not been taking his medications due to depression.      03/10/24 0348             Mode of Visit: In person    Psychiatry Consult Evaluation  Service Date: March 10, 2024 LOS:  LOS: 1 day  Chief Complaint depressed mood  Primary Psychiatric Diagnoses  Major depressive disorder, moderate, recurrent, in current episode 2.  Suspicion for autism spectrum disorder versus MNCD, follow-up in outpatient setting  Assessment  [name] is a 43 y.o. male admitted: Medicallyfor 03/09/2024  2:41 PM for shortness of breath found to have elevated blood pressure admitted for sepsis with elevated leukocytes and lactate, started on empiric antibiotics found to have ulcerated left foot, possible infectious source. He carries the psychiatric diagnoses of ADHD, historical diagnosis of bipolar disorder and has a past medical history of hypertension, type 2 diabetes uncontrolled, prior DKA, prior admission for uncontrolled diabetes/sepsis, hyperlipidemia, prior osteomyelitis.   His current presentation of depressed mood, anhedonia, difficulty concentrating, low energy, feelings of guilt/worthlessness is most consistent with major depressive disorder, moderate, recurrent, in current episode. Current outpatient psychotropic medications include none at this time.  He was not compliant with medications prior to admission as evidenced by patient report. On initial examination, patient says that he has been having difficulty maintaining his  medication and did not follow-up with outpatient therapy/psychiatry.  Believe that his depression, which is longstanding, probably played a role in his lack of motivation to maintain his medications as well as follow-up with outpatient psychiatry/therapy.  Was interested in restarting low-dose Zoloft  in the inpatient setting.  Discussed thoroughly need for follow-up med management and therapy.  Patient endorses restricted interests and difficulty making small changes to his life, which he says is overwhelming.  Suspect patient would benefit from being set up with disability, lives with [name] who likely helps with IADLs.  That said, patient denies suicidal ideation both passive and active for the last 7 to 8 years.  [name] agrees that patient has not voiced any suicidal thinking.  Given this, he does not currently meet IVC criteria at this time.  We will start low-dose Zoloft  for depressed mood and follow social work consult for outpatient follow-up.  Please see plan below for detailed recommendations.   Diagnoses:  Active Hospital problems: Principal Problem:   Sepsis (HCC) Active Problems:   Uncontrolled type 2 diabetes mellitus with hyperglycemia (HCC)   Major depressive disorder, recurrent episode, moderate (HCC)    Plan   ## Psychiatric Medication Recommendations:  - Start Zoloft  25 mg daily for depressed mood. -Ordered vitamin D/B12 labs, to be collected tomorrow.  Replete as necessary. - Social work consult placed for follow-up therapy and med management.  Previously seen at [address]  ## Medical Decision Making Capacity: Not specifically addressed in this encounter  ## Further Work-up:  TSH, B12, folate or While pt on Qtc prolonging medications, please monitor & replete K+ to 4 and Mg2+ to 2 --  most recent EKG on 10/17 had QtC of 471 -- Pertinent labwork reviewed earlier this admission includes: A1c 12.9   ## Disposition:-- There are no psychiatric contraindications to discharge  at this time  ## Behavioral / Environmental: - No specific recommendations at this time.     ## Safety and Observation Level:  - Based on my clinical evaluation, I estimate the patient to be at low risk of self harm in the current setting. - At this time, we recommend  routine. This decision is based on my review of the chart including patient's history and current presentation, interview of the patient, mental status examination, and consideration of suicide risk including evaluating suicidal ideation, plan, intent, suicidal or self-harm behaviors, risk factors, and protective factors. This judgment is based on our ability to directly address suicide risk, implement suicide prevention strategies, and develop a safety plan while the patient is in the clinical setting. Please contact our team if there is a concern that risk level has changed.  CSSR Risk Category:C-SSRS RISK CATEGORY: Low Risk  Suicide Risk Assessment: Patient has following modifiable risk factors for suicide: untreated depression, medication noncompliance, lack of access to outpatient mental health resources, current symptoms: anxiety/panic, insomnia, impulsivity, anhedonia, hopelessness, and pain, medical illness (ie new dx of cancer), which we are addressing by starting medication management, organizing outpatient follow-up, routine observation, medical treatment. Patient has following non-modifiable or demographic risk factors for suicide: male gender and family h/o suicide Patient has the following protective factors against suicide: Supportive family  Thank you for this consult request. Recommendations have been communicated to the primary team.  We will sign off at this time.   Gurtha Picker, MD       History of Present Illness  Relevant Aspects of Hospital Course:  Admitted on 03/09/2024 for fevers and chills, syncope secondary to complications of uncontrolled diabetes, admitted for sepsis protocol, started on IV  antibiotics. They consulted psychiatry with concern for depression as a factor for not taking his medication Patient Report:   On interview with patient and [name] in emergency room, patient says that he has not taken any of his medications in over a year.  Patient appears not to have thought thoroughly about the consequences of not taking his medications.  Denies passive suicidal ideation as well as active suicidal ideation.  [name], in room, agrees that patient has not voiced suicidal thinking and is not suicidal at present.  Last suicidal ideation was 7 to 8 years ago.  Patient says that his daily routine is very strict, and that making small changes in his life disrupts this routine significantly.  Patient reports frustration with being unable to follow-up with outpatient psychiatry/med management.  Patient believes he has diagnoses of bipolar disorder, ADHD with autistic traits.  Reports low mood, anhedonia, feelings of guilt/worthlessness, increased appetite and difficulty concentrating.  Patient says that on his previous medication of Zoloft  and atomoxetine , he was a completely different person.  Amenable to restarting Zoloft  in the inpatient setting with outpatient follow-up.  Counseled extensively that this patient with uncontrolled diabetes should not be drinking full sugar Pepsi any longer.  Appears patient's baseline level of functioning leaves something to be desired -- suspect depression may be contributing to feelings of being defeated.  Psych ROS:  Depression: Endorses Anxiety: A little bit Mania (lifetime and current): Patient endorses symptomatology of a manic episode, however, his description skews more closely to euthymic mood over the course of the week.  Can explore further in  outpatient setting.  However, appeared to tolerate low-dose SSRIs well without slipping into mania in the recent past. Psychosis: (lifetime and current): Denied  Collateral information:  [name] in  room  ROS   Psychiatric and Social History  Psychiatric History:  Information collected from [name]  Prev Dx/Sx: Depressed mood, bipolar disorder, ADHD, patient suspects autistic traits Current Psych Provider: Denies Home Meds (current): Denies Previous Med Trials: Zoloft , atomoxetine  Therapy: Previously, at [facility], not in some time  Prior Psych Hospitalization: Denies Prior Self Harm: Denies Prior Violence: Denies  Family Psych History: Cousin death by completed suicide  Social History:  Living situation: With [name] Access to weapons/lethal means: Denies   Substance History  Denies  Exam Findings  Physical Exam:  Vital Signs:  Temp:  [97.8 F (36.6 C)-101.1 F (38.4 C)] 97.8 F (36.6 C) (10/17 1224) Pulse Rate:  [92-137] 93 (10/17 1224) Resp:  [12-29] 18 (10/17 1224) BP: (74-145)/(51-98) 134/84 (10/17 1224) SpO2:  [92 %-100 %] 100 % (10/17 1224) Blood pressure 134/84, pulse 93, temperature 97.8 F (36.6 C), temperature source Oral, resp. rate 18, height 6' 3 (1.905 m), weight (!) 149.7 kg, SpO2 100%. Body mass index is 41.25 kg/m.  Physical Exam  Mental Status Exam: General Appearance: Laying in ED bed  Orientation:  Grossly oriented  Memory:  Intact  Concentration:  NL  Recall:  GOod  Attention  Good  Eye Contact:  Fair  Speech:  WNL  Language:  WNL  Volume:  Normal  Mood: Down  Affect:  Euthymic  Thought Process:  Coherent and Goal Directed  Thought Content:  Logical  Suicidal Thoughts:  No  Homicidal Thoughts:  No  Judgement:  Poor  Insight:  Shallow  Psychomotor Activity:  Negative  Akathisia:  No  Fund of Knowledge:  Fair      Assets:  Manufacturing Systems Engineer Social Support  Cognition:  WNL  ADL's:  Intact  AIMS (if indicated):        Other History   These have been pulled in through the EMR, reviewed, and updated if appropriate.  Family History:  The patient's family history includes Diabetes in his [name]; Hyperlipidemia in his  father; Hypertension in his father.  Medical History: Past Medical History:  Diagnosis Date   ADHD    Anxiety    Bipolar disorder (HCC)    Depression    Diabetes mellitus without complication (HCC)    type 2   Heart murmur    as a child   HLD (hyperlipidemia)    Hypertension    no meds   Peripheral vascular disease     Surgical History: Past Surgical History:  Procedure Laterality Date   AMPUTATION Left 03/06/2020   Procedure: LEFT GREAT TOE AMPUTATION;  Surgeon: Harden Jerona GAILS, MD;  Location: [facility] OR;  Service: Orthopedics;  Laterality: Left;   AMPUTATION Right 10/15/2021   Procedure: RIGHT GREAT TOE AMPUTATION;  Surgeon: Harden Jerona GAILS, MD;  Location: [facility] OR;  Service: Orthopedics;  Laterality: Right;   BONE BIOPSY Left 02/25/2023   Procedure: LEFT FIFTH METATARSAL BONE BIOPSY;  Surgeon: Lennie Barter, DPM;  Location: [facility] ORS;  Service: Podiatry;  Laterality: Left;   EYE SURGERY Bilateral    lasik   TRANSMETATARSAL AMPUTATION Right 02/25/2023   Procedure: RIGHT PARTIAL FIRST RAY AMPUTATION WITH ANTIBIOTIC BEAD APPLICATION;  Surgeon: Lennie Barter, DPM;  Location: [facility] ORS;  Service: Podiatry;  Laterality: Right;   WISDOM TOOTH EXTRACTION       Medications:   Current  Facility-Administered Medications:    ceFEPIme  (MAXIPIME ) 2 g in sodium chloride  0.9 % 100 mL IVPB, 2 g, Intravenous, Q8H, Franky Redia SAILOR, MD, Stopped at 03/10/24 [phone]   heparin  injection 5,000 Units, 5,000 Units, Subcutaneous, Q8H, Kakrakandy, Arshad N, MD, 5,000 Units at 03/10/24 0606   insulin  aspart (novoLOG ) injection 0-15 Units, 0-15 Units, Subcutaneous, TID WC, Kakrakandy, Arshad N, MD, 8 Units at 03/10/24 1200   insulin  aspart (novoLOG ) injection 4 Units, 4 Units, Subcutaneous, TID WC, Chen, Lydia D, RPH, 4 Units at 03/10/24 1201   insulin  glargine-yfgn (SEMGLEE ) injection 20 Units, 20 Units, Subcutaneous, QHS, Kakrakandy, Arshad N, MD, 20 Units at 03/10/24 0007   metroNIDAZOLE  (FLAGYL ) IVPB 500 mg,  500 mg, Intravenous, Q12H, Franky Redia SAILOR, MD, Stopped at 03/10/24 9372   sertraline  (ZOLOFT ) tablet 25 mg, 25 mg, Oral, Daily, Rollene Katz, MD, 25 mg at 03/10/24 1220   vancomycin  (VANCOREADY) IVPB 1750 mg/350 mL, 1,750 mg, Intravenous, Q12H, Franky Redia SAILOR, MD, Stopped at 03/10/24 1216  Current Outpatient Medications:    hydroxypropyl methylcellulose / hypromellose (ISOPTO TEARS / GONIOVISC) 2.5 % ophthalmic solution, Place 2 drops into both eyes daily as needed for dry eyes., Disp: , Rfl:    ibuprofen (ADVIL) 200 MG tablet, Take 400 mg by mouth every 6 (six) hours as needed for headache or mild pain (pain score 1-3)., Disp: , Rfl:   Allergies: Allergies  Allergen Reactions   Milk-Related Compounds Diarrhea    Sayward Horvath, MD

## 2024-03-10 NOTE — ED Notes (Signed)
 Pt back from MRI

## 2024-03-10 NOTE — ED Notes (Signed)
 Pt taken for Mri

## 2024-03-10 NOTE — ED Notes (Signed)
 EKG completed and transmitted onto chart. Provider notified

## 2024-03-11 LAB — CBC WITH DIFFERENTIAL/PLATELET
Abs Immature Granulocytes: 0.03 K/uL (ref 0.00–0.07)
Basophils Absolute: 0 K/uL (ref 0.0–0.1)
Basophils Relative: 0 %
Eosinophils Absolute: 0.1 K/uL (ref 0.0–0.5)
Eosinophils Relative: 1 %
HCT: 39.3 % (ref 39.0–52.0)
Hemoglobin: 13.2 g/dL (ref 13.0–17.0)
Immature Granulocytes: 0 %
Lymphocytes Relative: 22 %
Lymphs Abs: 1.7 K/uL (ref 0.7–4.0)
MCH: 28.3 pg (ref 26.0–34.0)
MCHC: 33.6 g/dL (ref 30.0–36.0)
MCV: 84.2 fL (ref 80.0–100.0)
Monocytes Absolute: 0.8 K/uL (ref 0.1–1.0)
Monocytes Relative: 11 %
Neutro Abs: 4.9 K/uL (ref 1.7–7.7)
Neutrophils Relative %: 66 %
Platelets: 146 K/uL — ABNORMAL LOW (ref 150–400)
RBC: 4.67 MIL/uL (ref 4.22–5.81)
RDW: 13.2 % (ref 11.5–15.5)
WBC: 7.6 K/uL (ref 4.0–10.5)
nRBC: 0 % (ref 0.0–0.2)

## 2024-03-11 LAB — RENAL FUNCTION PANEL
Albumin: 2.9 g/dL — ABNORMAL LOW (ref 3.5–5.0)
Anion gap: 13 (ref 5–15)
BUN: 8 mg/dL (ref 6–20)
CO2: 20 mmol/L — ABNORMAL LOW (ref 22–32)
Calcium: 8.2 mg/dL — ABNORMAL LOW (ref 8.9–10.3)
Chloride: 103 mmol/L (ref 98–111)
Creatinine, Ser: 0.76 mg/dL (ref 0.61–1.24)
GFR, Estimated: >60 mL/min (ref >60)
Glucose, Bld: 231 mg/dL — ABNORMAL HIGH (ref 70–99)
Phosphorus: 2.2 mg/dL — ABNORMAL LOW (ref 2.5–4.6)
Potassium: 3.6 mmol/L (ref 3.5–5.1)
Sodium: 136 mmol/L (ref 135–145)

## 2024-03-11 LAB — BLOOD CULTURE ID PANEL (REFLEXED) - BCID2

## 2024-03-11 LAB — VITAMIN D 25 HYDROXY (VIT D DEFICIENCY, FRACTURES): Vit D, 25-Hydroxy: 18.8 ng/mL — ABNORMAL LOW (ref 30–100)

## 2024-03-11 LAB — GLUCOSE, CAPILLARY
Glucose-Capillary: 240 mg/dL — ABNORMAL HIGH (ref 70–99)
Glucose-Capillary: 208 mg/dL — ABNORMAL HIGH (ref 70–99)
Glucose-Capillary: 282 mg/dL — ABNORMAL HIGH (ref 70–99)
Glucose-Capillary: 266 mg/dL — ABNORMAL HIGH (ref 70–99)

## 2024-03-11 LAB — VITAMIN B12: Vitamin B-12: 366 pg/mL (ref 180–914)

## 2024-03-11 LAB — MAGNESIUM: Magnesium: 1.9 mg/dL (ref 1.7–2.4)

## 2024-03-11 MED ORDER — CEFAZOLIN SODIUM-DEXTROSE 2-4 GM/100ML-% IV SOLN
2.0000 g | Freq: Three times a day (TID) | INTRAVENOUS | Status: AC
  Administered 2024-03-11 – 2024-03-15 (×15): 2 g via INTRAVENOUS
  Filled 2024-03-11 (×15): qty 100

## 2024-03-11 NOTE — Progress Notes (Signed)
 PHARMACY - PHYSICIAN COMMUNICATION CRITICAL VALUE ALERT - BLOOD CULTURE IDENTIFICATION (BCID)  [name] is an 43 y.o. male who presented to [facility] on 03/09/2024 with a chief complaint of fevers/chills/foot ulcer  Assessment:  Blood culture growing MSSA   Name of physician (or Provider) Contacted:  Dr. Shona  Current antibiotics:  Vancomycin  and Cefepime  and Flagyl   Changes to prescribed antibiotics recommended:  Change to Ancef  2 g IV q8h  Results for orders placed or performed during the hospital encounter of 03/09/24  Blood Culture ID Panel (Reflexed) (Collected: 03/09/2024  3:39 PM)  Result Value Ref Range   Enterococcus faecalis NOT DETECTED NOT DETECTED   Enterococcus Faecium NOT DETECTED NOT DETECTED   Listeria monocytogenes NOT DETECTED NOT DETECTED   Staphylococcus species DETECTED (A) NOT DETECTED   Staphylococcus aureus (BCID) DETECTED (A) NOT DETECTED   Staphylococcus epidermidis NOT DETECTED NOT DETECTED   Staphylococcus lugdunensis NOT DETECTED NOT DETECTED   Streptococcus species NOT DETECTED NOT DETECTED   Streptococcus agalactiae NOT DETECTED NOT DETECTED   Streptococcus pneumoniae NOT DETECTED NOT DETECTED   Streptococcus pyogenes NOT DETECTED NOT DETECTED   A.calcoaceticus-baumannii NOT DETECTED NOT DETECTED   Bacteroides fragilis NOT DETECTED NOT DETECTED   Enterobacterales NOT DETECTED NOT DETECTED   Enterobacter cloacae complex NOT DETECTED NOT DETECTED   Escherichia coli NOT DETECTED NOT DETECTED   Klebsiella aerogenes NOT DETECTED NOT DETECTED   Klebsiella oxytoca NOT DETECTED NOT DETECTED   Klebsiella pneumoniae NOT DETECTED NOT DETECTED   Proteus species NOT DETECTED NOT DETECTED   Salmonella species NOT DETECTED NOT DETECTED   Serratia marcescens NOT DETECTED NOT DETECTED   Haemophilus influenzae NOT DETECTED NOT DETECTED   Neisseria meningitidis NOT DETECTED NOT DETECTED   Pseudomonas aeruginosa NOT DETECTED NOT DETECTED    Stenotrophomonas maltophilia NOT DETECTED NOT DETECTED   Candida albicans NOT DETECTED NOT DETECTED   Candida auris NOT DETECTED NOT DETECTED   Candida glabrata NOT DETECTED NOT DETECTED   Candida krusei NOT DETECTED NOT DETECTED   Candida parapsilosis NOT DETECTED NOT DETECTED   Candida tropicalis NOT DETECTED NOT DETECTED   Cryptococcus neoformans/gattii NOT DETECTED NOT DETECTED   Meth resistant mecA/C and MREJ NOT DETECTED NOT DETECTED    Dail Cordella Misty 03/11/2024  2:29 AM

## 2024-03-11 NOTE — Progress Notes (Signed)
 PROGRESS NOTE    [name]  FMW:[id number] DOB: 11/01/1980 DOA: 03/09/2024 PCP: Vicci Barnie NOVAK, MD  Outpatient Specialists:     Brief Narrative:  As per H&P done on admission: [name] is a 43 y.o. male with history of diabetes mellitus type 2 and depression presently not taking medications for last few months as patient states he is depressed and also was not able to afford some of them presents to the ER after patient was having fever and chills since morning.  Patient states he woke up this morning with fever and chills with no associated productive cough abdominal pain chest pain or diarrhea.  Following the episode he went for a nap when he woke up he was still feeling feverish with rigors and he went to the bathroom to urinate when he briefly lost consciousness.  He states that he felt very weak and probably lost consciousness due to that.  Patient also had 2-3 episodes of vomiting.  Due to persistent fever he called EMS and was brought to the ER.   ED Course: In the ER patient blood pressure was in the low normal was tachycardic in the 130s with temperature of 102.9 F presentation concerning for sepsis.  CT chest abdomen pelvis shows gallbladder stones but otherwise nothing acute.  CT head was unremarkable.  After the fall patient had right knee pain but x-rays did not show anything acute.  UA is negative for nitrates and leukocyte esterase with mild ketones.  Patient does have an ulcer on the plantar aspect of the left foot with some erythema on the lateral toes.  Source likely could be the foot.  Started on empiric antibiotics admitted for further workup.  Blood sugars were uncontrolled with blood sugar of 446 and anion gap of 16.  Venous blood gas showed pH of 7.38.  Patient was placed on Lantus  insulin  with sliding scale coverage.  03/10/2024: Patient seen alongside patient's mother.  Patient reports that he has had the left foot ulcer for over 1 year.  Psychiatry  input is appreciated.  Continue antibiotics for now.  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  03/11/2024: Patient seen alongside patient's aunt.  Patient continues to improve.  No new complaints today.  Will consult wound care team.  Will have a low threshold to consult orthopedic team.  Follow wound cultures.  Blood cultures growing Staphylococcus pursue.   Assessment & Plan:   Principal Problem:   Sepsis (HCC) Active Problems:   Uncontrolled type 2 diabetes mellitus with hyperglycemia (HCC)   Major depressive disorder, recurrent episode, moderate (HCC)   Sepsis:  -Likely secondary to infected left foot ulcer. - Patient has had the ulcer for greater than 1 year. 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 -Blood cultures growing Staphylococcus pursue. - Follow wound culture. - Patient is currently on IV vancomycin , cefepime  and Flagyl   Uncontrolled diabetes mellitus type 2: -A1c of 12.9%. - Patient has been noncompliant. - Input from diabetic educator is highly appreciated.   - Continue to monitor blood sugar and adjust insulin  accordingly.   -Patient used to be on Lantus  20 units and has not taken it for many months now due to financial issues and also depression.  - Continue subcutaneous Semglee  20 units every night, subcutaneous NovoLog  4 units with meals 3 times daily and sliding scale insulin  coverage.   Acute renal failure: - Likely prerenal. - Resolved with hydration.  Syncope: - Likely from dehydration.  Regain consciousness within  a few seconds and felt weak while the episode happened in a standing position.   -Echo is not revealing.    Depression: - Psychiatric input is appreciated.   -- Resume sertraline .  Cholelithiasis:  DVT prophylaxis: Lovenox  Code Status: Full code Family Communication: Mother Disposition Plan: Patient remains inpatient for now.   Consultants:  Psychiatry Have low threshold to consult infectious disease and orthopedic  surgery.  Procedures:  None.  Antimicrobials:  IV vancomycin . IV cefepime .   Subjective: No new complaints  Objective: Vitals:   03/10/24 2334 03/11/24 0342 03/11/24 0822 03/11/24 1542  BP: (!) 149/99 (!) 144/96 (!) 147/95 (!) 140/88  Pulse: 97 96 95 89  Resp: 18 18 20 16   Temp: 98.2 F (36.8 C) 98.7 F (37.1 C) 98.2 F (36.8 C) 98.8 F (37.1 C)  TempSrc: Oral Oral Oral Oral  SpO2: 97% 97% 100% 99%  Weight:      Height:        Intake/Output Summary (Last 24 hours) at 03/11/2024 1942 Last data filed at 03/11/2024 1649 Gross per 24 hour  Intake 1196.66 ml  Output 1900 ml  Net -703.34 ml   Filed Weights   03/09/24 1501  Weight: (!) 149.7 kg    Examination:  General exam: Appears calm and comfortable.  Patient is morbidly obese. Respiratory system: Clear to auscultation. Respiratory effort normal. Cardiovascular system: S1 & S2 heard Gastrointestinal system: Abdomen is obese, soft and nontender.   Central nervous system: Awake and alert.   Extremities: Left foot ulcer.  Cannot rule out diabetic/vascular ulcers, infected          Data Reviewed: I have personally reviewed following labs and imaging studies  CBC: Recent Labs  Lab 03/09/24 1505 03/09/24 1758 03/09/24 1759 03/10/24 0110 03/11/24 0608  WBC 12.1*  --   --  10.0 7.6  NEUTROABS 10.5*  --   --   --  4.9  HGB 16.1 14.3 15.0 14.1 13.2  HCT 47.5 42.0 44.0 43.1 39.3  MCV 83.9  --   --  86.4 84.2  PLT 188  --   --  156 146*   Basic Metabolic Panel: Recent Labs  Lab 03/09/24 1505 03/09/24 1758 03/09/24 1759 03/10/24 0110 03/11/24 0608  NA 134* 138 139 136 136  K 3.9 3.8 3.8 3.7 3.6  CL 98  --  102 105 103  CO2 20*  --   --  20* 20*  GLUCOSE 446*  --  427* 340* 231*  BUN 13  --  14 12 8   CREATININE 1.25*  --  1.00 0.94 0.76  CALCIUM  8.7*  --   --  8.3* 8.2*  MG  --   --   --   --  1.9  PHOS  --   --   --   --  2.2*   GFR: Estimated Creatinine Clearance: 184.3 mL/min (by C-G  formula based on SCr of 0.76 mg/dL). Liver Function Tests: Recent Labs  Lab 03/09/24 1505 03/10/24 0110 03/11/24 0608  AST 29 20  --   ALT 22 19  --   ALKPHOS 106 81  --   BILITOT 0.8 1.1  --   PROT 7.1 6.1*  --   ALBUMIN 3.7 3.0* 2.9*   Recent Labs  Lab 03/09/24 1505  LIPASE 20   No results for input(s): AMMONIA in the last 168 hours. Coagulation Profile: Recent Labs  Lab 03/09/24 1505  INR 1.1   Cardiac Enzymes: No results for input(s): CKTOTAL,  CKMB, CKMBINDEX, TROPONINI in the last 168 hours. BNP (last 3 results) No results for input(s): PROBNP in the last 8760 hours. HbA1C: Recent Labs    03/10/24 0110  HGBA1C 12.9*   CBG: Recent Labs  Lab 03/10/24 1628 03/10/24 2108 03/11/24 0612 03/11/24 1131 03/11/24 1644  GLUCAP 256* 268* 240* 208* 282*   Lipid Profile: No results for input(s): CHOL, HDL, LDLCALC, TRIG, CHOLHDL, LDLDIRECT in the last 72 hours. Thyroid Function Tests: Recent Labs    03/10/24 0507  TSH 0.956   Anemia Panel: Recent Labs    03/11/24 0608  VITAMINB12 366   Urine analysis:    Component Value Date/Time   COLORURINE STRAW (A) 03/09/2024 1808   APPEARANCEUR CLEAR 03/09/2024 1808   LABSPEC 1.021 03/09/2024 1808   PHURINE 6.0 03/09/2024 1808   GLUCOSEU >=500 (A) 03/09/2024 1808   HGBUR NEGATIVE 03/09/2024 1808   BILIRUBINUR NEGATIVE 03/09/2024 1808   BILIRUBINUR negative 08/10/2016 1116   KETONESUR 20 (A) 03/09/2024 1808   PROTEINUR NEGATIVE 03/09/2024 1808   UROBILINOGEN 0.2 08/10/2016 1116   NITRITE NEGATIVE 03/09/2024 1808   LEUKOCYTESUR NEGATIVE 03/09/2024 1808   Sepsis Labs: @LABRCNTIP (procalcitonin:4,lacticidven:4)  ) Recent Results (from the past 240 hours)  Resp panel by RT-PCR (RSV, Flu A&B, Covid) Anterior Nasal Swab     Status: None   Collection Time: 03/09/24  3:06 PM   Specimen: Anterior Nasal Swab  Result Value Ref Range Status   SARS Coronavirus 2 by RT PCR NEGATIVE NEGATIVE  Final   Influenza A by PCR NEGATIVE NEGATIVE Final   Influenza B by PCR NEGATIVE NEGATIVE Final    Comment: (NOTE) The Xpert Xpress SARS-CoV-2/FLU/RSV plus assay is intended as an aid in the diagnosis of influenza from Nasopharyngeal swab specimens and should not be used as a sole basis for treatment. Nasal washings and aspirates are unacceptable for Xpert Xpress SARS-CoV-2/FLU/RSV testing.  Fact Sheet for Patients: BloggerCourse.com  Fact Sheet for Healthcare Providers: SeriousBroker.it  This test is not yet approved or cleared by the [place]  FDA and has been authorized for detection and/or diagnosis of SARS-CoV-2 by FDA under an Emergency Use Authorization (EUA). This EUA will remain in effect (meaning this test can be used) for the duration of the COVID-19 declaration under Section 564(b)(1) of the Act, 21 U.S.C. section 360bbb-3(b)(1), unless the authorization is terminated or revoked.     Resp Syncytial Virus by PCR NEGATIVE NEGATIVE Final    Comment: (NOTE) Fact Sheet for Patients: BloggerCourse.com  Fact Sheet for Healthcare Providers: SeriousBroker.it  This test is not yet approved or cleared by the [place]  FDA and has been authorized for detection and/or diagnosis of SARS-CoV-2 by FDA under an Emergency Use Authorization (EUA). This EUA will remain in effect (meaning this test can be used) for the duration of the COVID-19 declaration under Section 564(b)(1) of the Act, 21 U.S.C. section 360bbb-3(b)(1), unless the authorization is terminated or revoked.  Performed at [facility], [address] [place], [place] [address]   Culture, blood (Routine x 2)     Status: None (Preliminary result)   Collection Time: 03/09/24  3:39 PM   Specimen: BLOOD RIGHT HAND  Result Value Ref Range Status   Specimen Description BLOOD RIGHT HAND  Final   Special  Requests   Final    BOTTLES DRAWN AEROBIC AND ANAEROBIC Blood Culture results may not be optimal due to an inadequate volume of blood received in culture bottles   Culture  Setup Time  Final    GRAM POSITIVE COCCI AEROBIC BOTTLE ONLY GREG ABBOTT PHARM D 03/11/2024 @ 0219 BY DD    Culture   Final    GRAM POSITIVE COCCI TOO YOUNG TO READ Performed at [facility], [address] [place], [place] [address]    Report Status PENDING  Incomplete  Blood Culture ID Panel (Reflexed)     Status: Abnormal   Collection Time: 03/09/24  3:39 PM  Result Value Ref Range Status   Enterococcus faecalis NOT DETECTED NOT DETECTED Final   Enterococcus Faecium NOT DETECTED NOT DETECTED Final   Listeria monocytogenes NOT DETECTED NOT DETECTED Final   Staphylococcus species DETECTED (A) NOT DETECTED Final    Comment: CRITICAL RESULT CALLED TO, READ BACK BY AND VERIFIED WITH: GREG ABBOTT PHARM D 03/11/2024 @ 0219 BY DD    Staphylococcus aureus (BCID) DETECTED (A) NOT DETECTED Final    Comment: CRITICAL RESULT CALLED TO, READ BACK BY AND VERIFIED WITH: GREG ABBOTT PHARM D 03/11/2024 @ 0219 BY DD    Staphylococcus epidermidis NOT DETECTED NOT DETECTED Final   Staphylococcus lugdunensis NOT DETECTED NOT DETECTED Final   Streptococcus species NOT DETECTED NOT DETECTED Final   Streptococcus agalactiae NOT DETECTED NOT DETECTED Final   Streptococcus pneumoniae NOT DETECTED NOT DETECTED Final   Streptococcus pyogenes NOT DETECTED NOT DETECTED Final   A.calcoaceticus-baumannii NOT DETECTED NOT DETECTED Final   Bacteroides fragilis NOT DETECTED NOT DETECTED Final   Enterobacterales NOT DETECTED NOT DETECTED Final   Enterobacter cloacae complex NOT DETECTED NOT DETECTED Final   Escherichia coli NOT DETECTED NOT DETECTED Final   Klebsiella aerogenes NOT DETECTED NOT DETECTED Final   Klebsiella oxytoca NOT DETECTED NOT DETECTED Final   Klebsiella pneumoniae NOT DETECTED NOT DETECTED Final   Proteus  species NOT DETECTED NOT DETECTED Final   Salmonella species NOT DETECTED NOT DETECTED Final   Serratia marcescens NOT DETECTED NOT DETECTED Final   Haemophilus influenzae NOT DETECTED NOT DETECTED Final   Neisseria meningitidis NOT DETECTED NOT DETECTED Final   Pseudomonas aeruginosa NOT DETECTED NOT DETECTED Final   Stenotrophomonas maltophilia NOT DETECTED NOT DETECTED Final   Candida albicans NOT DETECTED NOT DETECTED Final   Candida auris NOT DETECTED NOT DETECTED Final   Candida glabrata NOT DETECTED NOT DETECTED Final   Candida krusei NOT DETECTED NOT DETECTED Final   Candida parapsilosis NOT DETECTED NOT DETECTED Final   Candida tropicalis NOT DETECTED NOT DETECTED Final   Cryptococcus neoformans/gattii NOT DETECTED NOT DETECTED Final   Meth resistant mecA/C and MREJ NOT DETECTED NOT DETECTED Final    Comment: Performed at [facility], [address] [place], [place] [address]  Culture, blood (Routine x 2)     Status: None (Preliminary result)   Collection Time: 03/09/24  7:27 PM   Specimen: BLOOD LEFT HAND  Result Value Ref Range Status   Specimen Description BLOOD LEFT HAND  Final   Special Requests   Final    BOTTLES DRAWN AEROBIC ONLY Blood Culture results may not be optimal due to an inadequate volume of blood received in culture bottles   Culture   Final    NO GROWTH 2 DAYS Performed at [facility], [address] [place], [place] [address]    Report Status PENDING  Incomplete         Radiology Studies: ECHOCARDIOGRAM COMPLETE Result Date: 03/10/2024    ECHOCARDIOGRAM REPORT   Patient Name:   KONGMENG SANTORO Date of Exam: 03/10/2024  Medical Rec #:  [id number]           Height:       75.0 in Accession #:    [id number]          Weight:       330.0 lb Date of Birth:  11/01/1980           BSA:          2.715 m Patient Age:    42 years            BP:           93/56 mmHg Patient Gender: M                   HR:           93 bpm. Exam Location:   Inpatient Procedure: 2D Echo, Cardiac Doppler, Color Doppler and Intracardiac            Opacification Agent (Both Spectral and Color Flow Doppler were            utilized during procedure). Indications:    Syncope R55  History:        Patient has no prior history of Echocardiogram examinations.                 Signs/Symptoms:Murmur; Risk Factors:Diabetes, Hypertension,                 Dyslipidemia and Sleep Apnea.  Sonographer:    Koleen Popper RDCS Referring Phys: [id number] SAILOR [facility]  Sonographer Comments: Patient is obese. Image acquisition challenging due to patient body habitus. IMPRESSIONS  1. Left ventricular ejection fraction, by estimation, is 70 to 75%. The left ventricle has hyperdynamic function. The left ventricle has no regional wall motion abnormalities. There is mild concentric left ventricular hypertrophy. Left ventricular diastolic parameters were normal.  2. Right ventricular systolic function is normal. The right ventricular size is normal. Tricuspid regurgitation signal is inadequate for assessing PA pressure.  3. The mitral valve is normal in structure. No evidence of mitral valve regurgitation. No evidence of mitral stenosis.  4. Possible bicuspid aortic valve. The aortic valve was not well visualized. Unable to determine aortic valve morphology due to image quality. Aortic valve regurgitation is not visualized. Aortic valve sclerosis is present, with no evidence of aortic valve stenosis.  5. The inferior vena cava is dilated in size with >50% respiratory variability, suggesting right atrial pressure of 8 mmHg. Comparison(s): No prior Echocardiogram. FINDINGS  Left Ventricle: Left ventricular ejection fraction, by estimation, is 70 to 75%. The left ventricle has hyperdynamic function. The left ventricle has no regional wall motion abnormalities. Definity contrast agent was given IV to delineate the left ventricular endocardial borders. The left ventricular internal cavity size was normal  in size. There is mild concentric left ventricular hypertrophy. Left ventricular diastolic parameters were normal. Right Ventricle: The right ventricular size is normal. No increase in right ventricular wall thickness. Right ventricular systolic function is normal. Tricuspid regurgitation signal is inadequate for assessing PA pressure. Left Atrium: Left atrial size was normal in size. Right Atrium: Grossly normal in size. Right atrial size was not assessed. Pericardium: There is no evidence of pericardial effusion. Mitral Valve: The mitral valve is normal in structure. No evidence of mitral valve regurgitation. No evidence of mitral valve stenosis. Tricuspid Valve: The tricuspid valve is normal in structure. Tricuspid valve regurgitation is not demonstrated. No evidence of tricuspid stenosis. Aortic Valve: Possible bicuspid aortic  valve. The aortic valve was not well visualized. Aortic valve regurgitation is not visualized. Aortic valve sclerosis is present, with no evidence of aortic valve stenosis. Pulmonic Valve: The pulmonic valve was normal in structure. Pulmonic valve regurgitation is not visualized. No evidence of pulmonic stenosis. Aorta: The aortic root and ascending aorta are structurally normal, with no evidence of dilitation. Venous: The inferior vena cava is dilated in size with greater than 50% respiratory variability, suggesting right atrial pressure of 8 mmHg. IAS/Shunts: No atrial level shunt detected by color flow Doppler.  LEFT VENTRICLE PLAX 2D LVIDd:         5.00 cm   Diastology LVIDs:         3.00 cm   LV e' medial:    10.90 cm/s LV PW:         1.10 cm   LV E/e' medial:  8.8 LV IVS:        1.10 cm   LV e' lateral:   10.70 cm/s LVOT diam:     2.25 cm   LV E/e' lateral: 8.9 LV SV:         70 LV SV Index:   26 LVOT Area:     3.98 cm  RIGHT VENTRICLE             IVC RV S prime:     11.60 cm/s  IVC diam: 2.60 cm TAPSE (M-mode): 2.5 cm LEFT ATRIUM             Index LA diam:        4.60 cm 1.69 cm/m  LA Vol (A2C):   47.3 ml 17.42 ml/m LA Vol (A4C):   52.1 ml 19.19 ml/m LA Biplane Vol: 55.1 ml 20.30 ml/m  AORTIC VALVE LVOT Vmax:   98.00 cm/s LVOT Vmean:  64.500 cm/s LVOT VTI:    0.175 m  AORTA Ao Root diam: 3.00 cm Ao Asc diam:  3.40 cm MITRAL VALVE MV Area (PHT): 3.83 cm    SHUNTS MV Decel Time: 198 msec    Systemic VTI:  0.18 m MV E velocity: 95.40 cm/s  Systemic Diam: 2.25 cm MV A velocity: 61.80 cm/s MV E/A ratio:  1.54 Emeline Calender Electronically signed by Emeline Calender Signature Date/Time: 03/10/2024/6:25:07 PM    Final    MR FOOT LEFT WO CONTRAST Result Date: 03/10/2024 CLINICAL DATA:  Soft tissue infection of the left foot EXAM: MRI OF THE LEFT FOOT WITHOUT CONTRAST TECHNIQUE: Multiplanar, multisequence MR imaging of the left foot from the Lisfranc joint through the toes was performed. No intravenous contrast was administered. COMPARISON:  Radiographs 03/09/2024 FINDINGS: Despite efforts by the technologist and patient, motion artifact is present on today's exam and could not be eliminated. This reduces exam sensitivity and specificity. 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 Abnormal flattening of the second metatarsal head potentially from Freiberg's disease or erosive arthropathy, with similar flattening and irregularity along the base of the proximal phalanx second toe. Erosive arthropathy in the head of the fifth metatarsal and in the proximal phalangeal base of the fifth toe in a pattern which can be seen in the setting of psoriatic arthritis, systemic sclerosis, rheumatoid arthritis, or reactive arthritis. Truncated tuft of the distal phalanx second toe with low T1 signal in the distal phalanx, chronic osteomyelitis not excluded. The only unusual feature with regard osteomyelitis is the lack of obvious STIR  signal. Truncated soft tissues along the distal-medial tip of  the second toe overlying the distal phalanx. Accentuated T2 signal in the distal phalanx third toe for example on image 18 series 7. Although the region is partially obscured by motion artifact, osteomyelitis of the distal phalanx third toe is not excluded. Ligaments Lisfranc ligament intact. Muscles and Tendons Regional muscular atrophy. Soft tissues Plantar ulceration below the fourth MTP joint with cutaneous thickening and irregularity and underlying subcutaneous edema. Possible cutaneous wound with subcutaneous edema lateral to the fifth MTP joint. IMPRESSION: 1. Plantar ulceration below the fourth MTP joint with cutaneous thickening and irregularity and underlying subcutaneous edema. Possible cutaneous wound with subcutaneous edema lateral to the fifth MTP joint. 2. Truncated tuft of the distal phalanx second toe with low T1 signal in the distal phalanx, chronic osteomyelitis not excluded. The only unusual feature with regard to osteomyelitis is the lack of obvious STIR signal. 3. Accentuated T2 signal in the distal phalanx third toe. Although the region is partially obscured by motion artifact, osteomyelitis of the distal phalanx third toe is not excluded. 4. Erosive arthropathy in the head of the fifth metatarsal and in the proximal phalangeal base of the fifth toe in a pattern which can be seen in the setting of psoriatic arthritis, systemic sclerosis, rheumatoid arthritis, or reactive arthritis. 5. 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 6. Abnormal flattening of the second metatarsal head potentially from Freiberg's disease or erosive arthropathy, with similar flattening and irregularity along the base of the proximal phalanx second toe. Electronically Signed   By: Ryan Salvage M.D.   On: 03/10/2024 09:00   CT HEAD WO CONTRAST ( ) Result Date: 03/10/2024 EXAM: CT HEAD WITHOUT CONTRAST 03/10/2024 12:07:14 AM TECHNIQUE: CT of the head was performed without the administration of  intravenous contrast. Automated exposure control, iterative reconstruction, and/or weight based adjustment of the mA/kV was utilized to reduce the radiation dose to as low as reasonably achievable. COMPARISON: None available. CLINICAL HISTORY: fall. Pt coming from home with bg of 446. Pt non-compliant with his insulin . Pt had a syncopal episode at home witnessed by mother. FINDINGS: BRAIN AND VENTRICLES: No acute hemorrhage. No evidence of acute infarct. No hydrocephalus. No extra-axial collection. No mass effect or midline shift. ORBITS: No acute abnormality. SINUSES: No acute abnormality. SOFT TISSUES AND SKULL: No acute soft tissue abnormality. No skull fracture. IMPRESSION: 1. No acute intracranial abnormality. Electronically signed by: Pinkie Pebbles MD 03/10/2024 12:12 AM EDT RP Workstation: [id number]   CT CHEST ABDOMEN PELVIS W CONTRAST Result Date: 03/10/2024 EXAM: CT CHEST, ABDOMEN AND PELVIS WITH CONTRAST 03/10/2024 12:07:14 AM TECHNIQUE: CT of the chest, abdomen and pelvis was performed with the administration of 100 mL of iohexol (OMNIPAQUE) 350 MG/ML injection. Multiplanar reformatted images are provided for review. Automated exposure control, iterative reconstruction, and/or weight based adjustment of the mA/kV was utilized to reduce the radiation dose to as low as reasonably achievable. COMPARISON: None available. CLINICAL HISTORY: Abdominal pain, acute, nonlocalized. Pt coming from home with bg of 446. Pt non-compliant with his insulin . Pt had a syncopal episode at home witnessed by mother. FINDINGS: CHEST: MEDIASTINUM AND LYMPH NODES: Heart and pericardium are unremarkable. The central airways are clear. No mediastinal, hilar or axillary lymphadenopathy. LUNGS AND PLEURA: Mild bilateral lower lobe atelectasis. No focal consolidation or pulmonary edema. No pleural effusion or pneumothorax. ABDOMEN AND PELVIS: LIVER: The liver is unremarkable. GALLBLADDER AND BILE DUCTS: Layering gallstones  (image 69), without associated inflammatory changes. No  biliary ductal dilatation. SPLEEN: No acute abnormality. PANCREAS: No acute abnormality. ADRENAL GLANDS: No acute abnormality. KIDNEYS, URETERS AND BLADDER: No stones in the kidneys or ureters. No hydronephrosis. No perinephric or periureteral stranding. Urinary bladder is unremarkable. GI AND BOWEL: Stomach demonstrates no acute abnormality. Mild to moderate colonic stool burden. Normal appendix (image 109). There is no bowel obstruction. REPRODUCTIVE ORGANS: The prostate is unremarkable. PERITONEUM AND RETROPERITONEUM: No ascites. No free air. VASCULATURE: Aorta is normal in caliber. ABDOMINAL AND PELVIS LYMPH NODES: No lymphadenopathy. BONES AND SOFT TISSUES: Mild degenerative changes of the thoracolumbar spine. No focal soft tissue abnormality. IMPRESSION: 1. No acute findings. 2. Cholelithiasis, without inflammatory changes. Electronically signed by: Pinkie Pebbles MD 03/10/2024 12:11 AM EDT RP Workstation: [id number]   DG Knee 1-2 Views Right Result Date: 03/09/2024 EXAM: 2 VIEW(S) XRAY OF THE KNEE 03/09/2024 08:49:37 PM COMPARISON: None available. CLINICAL HISTORY: Knee pain FINDINGS: BONES AND JOINTS: No acute fracture. No focal osseous lesion. No joint dislocation. No significant joint effusion. Small patellar spurs at the quadriceps and patellar tendon insertions. Mild degenerative changes in the patellofemoral compartment. SOFT TISSUES: The soft tissues are unremarkable. IMPRESSION: 1. No acute findings. Electronically signed by: Pinkie Pebbles MD 03/09/2024 08:55 PM EDT RP Workstation: [id number]        Scheduled Meds:  heparin   5,000 Units Subcutaneous Q8H   insulin  aspart  0-15 Units Subcutaneous TID WC   insulin  aspart  4 Units Subcutaneous TID WC   insulin  glargine-yfgn  20 Units Subcutaneous QHS   sertraline   25 mg Oral Daily   Continuous Infusions:   ceFAZolin  (ANCEF ) IV Stopped (03/11/24 1455)     LOS: 2 days     Time spent: 35 minutes    Leatrice Chapel, MD  [facility] Pager #: [phone] 7PM-7AM contact night coverage as above

## 2024-03-11 NOTE — Consult Note (Signed)
 [facility]                                                                                        Patient Identification: Patient Name: [name] MRN: [id number] Admit Date: 03/09/2024  2:41 PM Today's Date: 03/11/2024 Reason for consult: Staph bacteremia Requesting provider: Sharyon auto consult  Principal Problem:   Sepsis [facility]) Active Problems:   Uncontrolled type 2 diabetes mellitus with hyperglycemia (HCC)   Major depressive disorder, recurrent episode, moderate (HCC)   Antibiotics:  Vancomycin  10/16-10/17 Cefepime  10/16-10/17 Metronidazole  10/16-10/17 Cefazolin  10/17  Lines/Hardware:  Assessment # MSSA bacteremia  Recommendations      Rest of the management as per the primary team. Please call with questions or concerns.  Thank you for the consult  __________________________________________________________________________________________________________ HPI and Hospital Course: 43 year old male with prior history of HTN, type IIDM, HLD, ADHD/anxiety/depression who presented to the ED on 10/16 with fevers, chills and vomiting for 1 day resulting on syncopal episode.  Found to be tachycardic and route with EMS with systolic BP in 80s which improved with IVF as well as blood glucose elevated to 400s.  Denies any prodromal chest pain, shortness of breath or cough.  Denied nausea, vomiting or abdominal pain or diarrhea He had right knee pain due to fall  At ED code sepsis activated Febrile Labs remarkable for BG 446, creatinine elevated to 1.25, initial lactic acid 1.9, WBC 12.1 Influenza A/influenza B/RSV/SARS-CoV-2 negative HIV negative 8 unremarkable for UTI, blood glucose greater than 500, ketones 20 was given IVF and broad-spectrum antibiotics  Mri Left foot IMPRESSION: 1. Plantar ulceration below the fourth MTP joint with cutaneous thickening and  irregularity and underlying subcutaneous edema. Possible cutaneous wound with subcutaneous edema lateral to the fifth MTP joint. 2. Truncated tuft of the distal phalanx second toe with low T1 signal in the distal phalanx, chronic osteomyelitis not excluded. The only unusual feature with regard to osteomyelitis is the lack of obvious STIR signal. 3. Accentuated T2 signal in the distal phalanx third toe. Although the region is partially obscured by motion artifact, osteomyelitis of the distal phalanx third toe is not excluded. 4. Erosive arthropathy in the head of the fifth metatarsal and in the proximal phalangeal base of the fifth toe in a pattern which can be seen in the setting of psoriatic arthritis, systemic sclerosis, rheumatoid arthritis, or reactive arthritis. 5. 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 6. Abnormal flattening of the second metatarsal head potentially from Freiberg's disease or erosive arthropathy, with similar flattening and irregularity along the base of the proximal phalanx second toe.  General- Denies fever, chills, loss of appetite and loss of weight HEENT - Denies headache, blurry vision, neck pain, sinus pain Chest - Denies any chest pain, SOB or cough CVS- Denies any dizziness/lightheadedness, syncopal attacks, palpitations Abdomen- Denies any nausea, vomiting, abdominal pain, hematochezia and diarrhea Neuro - Denies any weakness, numbness, tingling sensation Psych - Denies any changes in mood irritability or depressive symptoms GU- Denies any burning, dysuria, hematuria or increased frequency of urination Skin - denies any rashes/lesions MSK - denies any joint  pain/swelling or restricted ROM   Past Medical History:  Diagnosis Date   ADHD    Anxiety    Bipolar disorder (HCC)    Depression    Diabetes mellitus without complication (HCC)    type 2   Heart murmur    as a child   HLD (hyperlipidemia)    Hypertension    no meds    Peripheral vascular disease    Past Surgical History:  Procedure Laterality Date   AMPUTATION Left 03/06/2020   Procedure: LEFT GREAT TOE AMPUTATION;  Surgeon: Harden Jerona GAILS, MD;  Location: [facility] OR;  Service: Orthopedics;  Laterality: Left;   AMPUTATION Right 10/15/2021   Procedure: RIGHT GREAT TOE AMPUTATION;  Surgeon: Harden Jerona GAILS, MD;  Location: [facility] OR;  Service: Orthopedics;  Laterality: Right;   BONE BIOPSY Left 02/25/2023   Procedure: LEFT FIFTH METATARSAL BONE BIOPSY;  Surgeon: Lennie Barter, DPM;  Location: [facility] ORS;  Service: Podiatry;  Laterality: Left;   EYE SURGERY Bilateral    lasik   TRANSMETATARSAL AMPUTATION Right 02/25/2023   Procedure: RIGHT PARTIAL FIRST RAY AMPUTATION WITH ANTIBIOTIC BEAD APPLICATION;  Surgeon: Lennie Barter, DPM;  Location: [facility] ORS;  Service: Podiatry;  Laterality: Right;   WISDOM TOOTH EXTRACTION     Scheduled Meds:  heparin   5,000 Units Subcutaneous Q8H   insulin  aspart  0-15 Units Subcutaneous TID WC   insulin  aspart  4 Units Subcutaneous TID WC   insulin  glargine-yfgn  20 Units Subcutaneous QHS   sertraline   25 mg Oral Daily   Continuous Infusions:   ceFAZolin  (ANCEF ) IV 2 g (03/11/24 0622)   PRN Meds:.  Allergies  Allergen Reactions   Milk-Related Compounds Diarrhea    Social History   Socioeconomic History   Marital status: Single    Spouse name: Not on file   Number of children: Not on file   Years of education: Not on file   Highest education level: Not on file  Occupational History   Occupation: Unemployed  Tobacco Use   Smoking status: Never   Smokeless tobacco: Never  Vaping Use   Vaping status: Never Used  Substance and Sexual Activity   Alcohol use: Not Currently    Comment: last drink 2018   Drug use: Never   Sexual activity: Not Currently  Other Topics Concern   Not on file  Social History Narrative   Live with parents in [place].    Social Drivers of Health   Financial Resource Strain: Patient Declined  (05/24/2023)   Received from [facility]   Overall Financial Resource Strain (CARDIA)    Difficulty of Paying Living Expenses: Patient declined  Recent Concern: Financial Resource Strain - High Risk (04/06/2023)   Overall Financial Resource Strain (CARDIA)    Difficulty of Paying Living Expenses: Very hard  Food Insecurity: No Food Insecurity (03/10/2024)   Hunger Vital Sign    Worried About Running Out of Food in the Last Year: Never true    Ran Out of Food in the Last Year: Never true  Transportation Needs: No Transportation Needs (03/10/2024)   PRAPARE - Administrator, Civil Service (Medical): No    Lack of Transportation (Non-Medical): No  Physical Activity: Inactive (04/06/2023)   Exercise Vital Sign    Days of Exercise per Week: 0 days    Minutes of Exercise per Session: 0 min  Stress: Stress Concern Present (04/06/2023)   [employer] of Occupational Health - Occupational Stress Questionnaire  Feeling of Stress : Very much  Social Connections: Socially Isolated (04/06/2023)   Social Connection and Isolation Panel    Frequency of Communication with Friends and Family: More than three times a week    Frequency of Social Gatherings with Friends and Family: More than three times a week    Attends Religious Services: Never    Database administrator or Organizations: No    Attends Banker Meetings: Never    Marital Status: Never married  Intimate Partner Violence: Not At Risk (03/10/2024)   Humiliation, Afraid, Rape, and Kick questionnaire    Fear of Current or Ex-Partner: No    Emotionally Abused: No    Physically Abused: No    Sexually Abused: No   Family History  Problem Relation Age of Onset   Diabetes Mother    Hyperlipidemia Father    Hypertension Father    Vitals BP (!) 144/96 (BP Location: Left Arm)   Pulse 96   Temp 98.7 F (37.1 C) (Oral)   Resp 18   Ht 6' 3 (1.905 m)   Wt (!) 149.7 kg   SpO2 97%   BMI  41.25 kg/m    Physical Exam Constitutional:      Comments:   Cardiovascular:     Rate and Rhythm: Normal rate and regular rhythm.     Heart sounds: No murmur heard.   Pulmonary:     Effort: Pulmonary effort is normal.     Comments:   Abdominal:     Palpations: Abdomen is soft.     Tenderness:   Musculoskeletal:        General: No swelling or tenderness.   Skin:    Comments:   Neurological:     General: No focal deficit present.   Psychiatric:        Mood and Affect: Mood normal.    Pertinent Microbiology Results for orders placed or performed during the hospital encounter of 03/09/24  Resp panel by RT-PCR (RSV, Flu A&B, Covid) Anterior Nasal Swab     Status: None   Collection Time: 03/09/24  3:06 PM   Specimen: Anterior Nasal Swab  Result Value Ref Range Status   SARS Coronavirus 2 by RT PCR NEGATIVE NEGATIVE Final   Influenza A by PCR NEGATIVE NEGATIVE Final   Influenza B by PCR NEGATIVE NEGATIVE Final    Comment: (NOTE) The Xpert Xpress SARS-CoV-2/FLU/RSV plus assay is intended as an aid in the diagnosis of influenza from Nasopharyngeal swab specimens and should not be used as a sole basis for treatment. Nasal washings and aspirates are unacceptable for Xpert Xpress SARS-CoV-2/FLU/RSV testing.  Fact Sheet for Patients: BloggerCourse.com  Fact Sheet for Healthcare Providers: SeriousBroker.it  This test is not yet approved or cleared by the [place]  FDA and has been authorized for detection and/or diagnosis of SARS-CoV-2 by FDA under an Emergency Use Authorization (EUA). This EUA will remain in effect (meaning this test can be used) for the duration of the COVID-19 declaration under Section 564(b)(1) of the Act, 21 U.S.C. section 360bbb-3(b)(1), unless the authorization is terminated or revoked.     Resp Syncytial Virus by PCR NEGATIVE NEGATIVE Final    Comment: (NOTE) Fact Sheet for  Patients: BloggerCourse.com  Fact Sheet for Healthcare Providers: SeriousBroker.it  This test is not yet approved or cleared by the [place]  FDA and has been authorized for detection and/or diagnosis of SARS-CoV-2 by FDA under an Emergency Use Authorization (EUA). This EUA will remain in effect (  meaning this test can be used) for the duration of the COVID-19 declaration under Section 564(b)(1) of the Act, 21 U.S.C. section 360bbb-3(b)(1), unless the authorization is terminated or revoked.  Performed at [facility], [address] [place], [place] [address]   Culture, blood (Routine x 2)     Status: None (Preliminary result)   Collection Time: 03/09/24  3:39 PM   Specimen: BLOOD RIGHT HAND  Result Value Ref Range Status   Specimen Description BLOOD RIGHT HAND  Final   Special Requests   Final    BOTTLES DRAWN AEROBIC AND ANAEROBIC Blood Culture results may not be optimal due to an inadequate volume of blood received in culture bottles   Culture  Setup Time   Final    GRAM POSITIVE COCCI AEROBIC BOTTLE ONLY GREG ABBOTT PHARM D 03/11/2024 @ 0219 BY DD Performed at [facility], [address] [place], [place] [address]    Culture GRAM POSITIVE COCCI IN CLUSTERS  Final   Report Status PENDING  Incomplete  Blood Culture ID Panel (Reflexed)     Status: Abnormal   Collection Time: 03/09/24  3:39 PM  Result Value Ref Range Status   Enterococcus faecalis NOT DETECTED NOT DETECTED Final   Enterococcus Faecium NOT DETECTED NOT DETECTED Final   Listeria monocytogenes NOT DETECTED NOT DETECTED Final   Staphylococcus species DETECTED (A) NOT DETECTED Final    Comment: CRITICAL RESULT CALLED TO, READ BACK BY AND VERIFIED WITH: GREG ABBOTT PHARM D 03/11/2024 @ 0219 BY DD    Staphylococcus aureus (BCID) DETECTED (A) NOT DETECTED Final    Comment: CRITICAL RESULT CALLED TO, READ BACK BY AND VERIFIED WITH: GREG ABBOTT PHARM  D 03/11/2024 @ 0219 BY DD    Staphylococcus epidermidis NOT DETECTED NOT DETECTED Final   Staphylococcus lugdunensis NOT DETECTED NOT DETECTED Final   Streptococcus species NOT DETECTED NOT DETECTED Final   Streptococcus agalactiae NOT DETECTED NOT DETECTED Final   Streptococcus pneumoniae NOT DETECTED NOT DETECTED Final   Streptococcus pyogenes NOT DETECTED NOT DETECTED Final   A.calcoaceticus-baumannii NOT DETECTED NOT DETECTED Final   Bacteroides fragilis NOT DETECTED NOT DETECTED Final   Enterobacterales NOT DETECTED NOT DETECTED Final   Enterobacter cloacae complex NOT DETECTED NOT DETECTED Final   Escherichia coli NOT DETECTED NOT DETECTED Final   Klebsiella aerogenes NOT DETECTED NOT DETECTED Final   Klebsiella oxytoca NOT DETECTED NOT DETECTED Final   Klebsiella pneumoniae NOT DETECTED NOT DETECTED Final   Proteus species NOT DETECTED NOT DETECTED Final   Salmonella species NOT DETECTED NOT DETECTED Final   Serratia marcescens NOT DETECTED NOT DETECTED Final   Haemophilus influenzae NOT DETECTED NOT DETECTED Final   Neisseria meningitidis NOT DETECTED NOT DETECTED Final   Pseudomonas aeruginosa NOT DETECTED NOT DETECTED Final   Stenotrophomonas maltophilia NOT DETECTED NOT DETECTED Final   Candida albicans NOT DETECTED NOT DETECTED Final   Candida auris NOT DETECTED NOT DETECTED Final   Candida glabrata NOT DETECTED NOT DETECTED Final   Candida krusei NOT DETECTED NOT DETECTED Final   Candida parapsilosis NOT DETECTED NOT DETECTED Final   Candida tropicalis NOT DETECTED NOT DETECTED Final   Cryptococcus neoformans/gattii NOT DETECTED NOT DETECTED Final   Meth resistant mecA/C and MREJ NOT DETECTED NOT DETECTED Final    Comment: Performed at [facility], [address] [place], [place] [address]  Culture, blood (Routine x 2)     Status: None (Preliminary result)   Collection Time: 03/09/24  7:27 PM   Specimen: BLOOD LEFT HAND  Result Value Ref Range Status    Specimen Description BLOOD LEFT HAND  Final   Special Requests   Final    BOTTLES DRAWN AEROBIC ONLY Blood Culture results may not be optimal due to an inadequate volume of blood received in culture bottles   Culture   Final    NO GROWTH < 12 HOURS Performed at [facility], [address] [place], [place] [address]    Report Status PENDING  Incomplete   Pertinent Lab seen by me:    Latest Ref Rng & Units 03/11/2024    6:08 AM 03/10/2024    1:10 AM 03/09/2024    5:59 PM  CBC  WBC 4.0 - 10.5 K/uL 7.6  10.0    Hemoglobin 13.0 - 17.0 g/dL 86.7  85.8  84.9   Hematocrit 39.0 - 52.0 % 39.3  43.1  44.0   Platelets 150 - 400 K/uL 146  156        Latest Ref Rng & Units 03/11/2024    6:08 AM 03/10/2024    1:10 AM 03/09/2024    5:59 PM  CMP  Glucose 70 - 99 mg/dL 768  659  572   BUN 6 - 20 mg/dL 8  12  14    Creatinine 0.61 - 1.24 mg/dL 9.23  9.05  8.99   Sodium 135 - 145 mmol/L 136  136  139   Potassium 3.5 - 5.1 mmol/L 3.6  3.7  3.8   Chloride 98 - 111 mmol/L 103  105  102   CO2 22 - 32 mmol/L 20  20    Calcium  8.9 - 10.3 mg/dL 8.2  8.3    Total Protein 6.5 - 8.1 g/dL  6.1    Total Bilirubin 0.0 - 1.2 mg/dL  1.1    Alkaline Phos 38 - 126 U/L  81    AST 15 - 41 U/L  20    ALT 0 - 44 U/L  19       Pertinent Imagings/Other Imagings Plain films and CT images have been personally visualized and interpreted; radiology reports have been reviewed. Decision making incorporated into the Impression / Recommendations.  ECHOCARDIOGRAM COMPLETE Result Date: 03/10/2024    ECHOCARDIOGRAM REPORT   Patient Name:   DANIELL MANCINAS Date of Exam: 03/10/2024 Medical Rec #:  [id number]           Height:       75.0 in Accession #:    [id number]          Weight:       330.0 lb Date of Birth:  1980-07-24           BSA:          2.715 m Patient Age:    42 years            BP:           93/56 mmHg Patient Gender: M                   HR:           93 bpm. Exam Location:  Inpatient Procedure: 2D  Echo, Cardiac Doppler, Color Doppler and Intracardiac            Opacification Agent (Both Spectral and Color Flow Doppler were            utilized during procedure). Indications:    Syncope R55  History:  Patient has no prior history of Echocardiogram examinations.                 Signs/Symptoms:Murmur; Risk Factors:Diabetes, Hypertension,                 Dyslipidemia and Sleep Apnea.  Sonographer:    Koleen Popper RDCS Referring Phys: [id number] REDIA SAILOR [facility]  Sonographer Comments: Patient is obese. Image acquisition challenging due to patient body habitus. IMPRESSIONS  1. Left ventricular ejection fraction, by estimation, is 70 to 75%. The left ventricle has hyperdynamic function. The left ventricle has no regional wall motion abnormalities. There is mild concentric left ventricular hypertrophy. Left ventricular diastolic parameters were normal.  2. Right ventricular systolic function is normal. The right ventricular size is normal. Tricuspid regurgitation signal is inadequate for assessing PA pressure.  3. The mitral valve is normal in structure. No evidence of mitral valve regurgitation. No evidence of mitral stenosis.  4. Possible bicuspid aortic valve. The aortic valve was not well visualized. Unable to determine aortic valve morphology due to image quality. Aortic valve regurgitation is not visualized. Aortic valve sclerosis is present, with no evidence of aortic valve stenosis.  5. The inferior vena cava is dilated in size with >50% respiratory variability, suggesting right atrial pressure of 8 mmHg. Comparison(s): No prior Echocardiogram. FINDINGS  Left Ventricle: Left ventricular ejection fraction, by estimation, is 70 to 75%. The left ventricle has hyperdynamic function. The left ventricle has no regional wall motion abnormalities. Definity contrast agent was given IV to delineate the left ventricular endocardial borders. The left ventricular internal cavity size was normal in size. There is mild  concentric left ventricular hypertrophy. Left ventricular diastolic parameters were normal. Right Ventricle: The right ventricular size is normal. No increase in right ventricular wall thickness. Right ventricular systolic function is normal. Tricuspid regurgitation signal is inadequate for assessing PA pressure. Left Atrium: Left atrial size was normal in size. Right Atrium: Grossly normal in size. Right atrial size was not assessed. Pericardium: There is no evidence of pericardial effusion. Mitral Valve: The mitral valve is normal in structure. No evidence of mitral valve regurgitation. No evidence of mitral valve stenosis. Tricuspid Valve: The tricuspid valve is normal in structure. Tricuspid valve regurgitation is not demonstrated. No evidence of tricuspid stenosis. Aortic Valve: Possible bicuspid aortic valve. The aortic valve was not well visualized. Aortic valve regurgitation is not visualized. Aortic valve sclerosis is present, with no evidence of aortic valve stenosis. Pulmonic Valve: The pulmonic valve was normal in structure. Pulmonic valve regurgitation is not visualized. No evidence of pulmonic stenosis. Aorta: The aortic root and ascending aorta are structurally normal, with no evidence of dilitation. Venous: The inferior vena cava is dilated in size with greater than 50% respiratory variability, suggesting right atrial pressure of 8 mmHg. IAS/Shunts: No atrial level shunt detected by color flow Doppler.  LEFT VENTRICLE PLAX 2D LVIDd:         5.00 cm   Diastology LVIDs:         3.00 cm   LV e' medial:    10.90 cm/s LV PW:         1.10 cm   LV E/e' medial:  8.8 LV IVS:        1.10 cm   LV e' lateral:   10.70 cm/s LVOT diam:     2.25 cm   LV E/e' lateral: 8.9 LV SV:         70 LV SV Index:   26  LVOT Area:     3.98 cm  RIGHT VENTRICLE             IVC RV S prime:     11.60 cm/s  IVC diam: 2.60 cm TAPSE (M-mode): 2.5 cm LEFT ATRIUM             Index LA diam:        4.60 cm 1.69 cm/m LA Vol (A2C):   47.3  ml 17.42 ml/m LA Vol (A4C):   52.1 ml 19.19 ml/m LA Biplane Vol: 55.1 ml 20.30 ml/m  AORTIC VALVE LVOT Vmax:   98.00 cm/s LVOT Vmean:  64.500 cm/s LVOT VTI:    0.175 m  AORTA Ao Root diam: 3.00 cm Ao Asc diam:  3.40 cm MITRAL VALVE MV Area (PHT): 3.83 cm    SHUNTS MV Decel Time: 198 msec    Systemic VTI:  0.18 m MV E velocity: 95.40 cm/s  Systemic Diam: 2.25 cm MV A velocity: 61.80 cm/s MV E/A ratio:  1.54 Emeline Calender Electronically signed by Emeline Calender Signature Date/Time: 03/10/2024/6:25:07 PM    Final    MR FOOT LEFT WO CONTRAST Result Date: 03/10/2024 CLINICAL DATA:  Soft tissue infection of the left foot EXAM: MRI OF THE LEFT FOOT WITHOUT CONTRAST TECHNIQUE: Multiplanar, multisequence MR imaging of the left foot from the Lisfranc joint through the toes was performed. No intravenous contrast was administered. COMPARISON:  Radiographs 03/09/2024 FINDINGS: Despite efforts by the technologist and patient, motion artifact is present on today's exam and could not be eliminated. This reduces exam sensitivity and specificity. 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 Abnormal flattening of the second metatarsal head potentially from Freiberg's disease or erosive arthropathy, with similar flattening and irregularity along the base of the proximal phalanx second toe. Erosive arthropathy in the head of the fifth metatarsal and in the proximal phalangeal base of the fifth toe in a pattern which can be seen in the setting of psoriatic arthritis, systemic sclerosis, rheumatoid arthritis, or reactive arthritis. Truncated tuft of the distal phalanx second toe with low T1 signal in the distal phalanx, chronic osteomyelitis not excluded. The only unusual feature with regard osteomyelitis is the lack of obvious STIR signal. Truncated soft tissues along the distal-medial tip of the second toe  overlying the distal phalanx. Accentuated T2 signal in the distal phalanx third toe for example on image 18 series 7. Although the region is partially obscured by motion artifact, osteomyelitis of the distal phalanx third toe is not excluded. Ligaments Lisfranc ligament intact. Muscles and Tendons Regional muscular atrophy. Soft tissues Plantar ulceration below the fourth MTP joint with cutaneous thickening and irregularity and underlying subcutaneous edema. Possible cutaneous wound with subcutaneous edema lateral to the fifth MTP joint. IMPRESSION: 1. Plantar ulceration below the fourth MTP joint with cutaneous thickening and irregularity and underlying subcutaneous edema. Possible cutaneous wound with subcutaneous edema lateral to the fifth MTP joint. 2. Truncated tuft of the distal phalanx second toe with low T1 signal in the distal phalanx, chronic osteomyelitis not excluded. The only unusual feature with regard to osteomyelitis is the lack of obvious STIR signal. 3. Accentuated T2 signal in the distal phalanx third toe. Although the region is partially obscured by motion artifact, osteomyelitis of the distal phalanx third toe is not excluded. 4. Erosive arthropathy in the head of the fifth metatarsal and in the proximal phalangeal base  of the fifth toe in a pattern which can be seen in the setting of psoriatic arthritis, systemic sclerosis, rheumatoid arthritis, or reactive arthritis. 5. 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 6. Abnormal flattening of the second metatarsal head potentially from Freiberg's disease or erosive arthropathy, with similar flattening and irregularity along the base of the proximal phalanx second toe. Electronically Signed   By: Ryan Salvage M.D.   On: 03/10/2024 09:00   CT HEAD WO CONTRAST ( ) Result Date: 03/10/2024 EXAM: CT HEAD WITHOUT CONTRAST 03/10/2024 12:07:14 AM TECHNIQUE: CT of the head was performed without the administration of intravenous contrast.  Automated exposure control, iterative reconstruction, and/or weight based adjustment of the mA/kV was utilized to reduce the radiation dose to as low as reasonably achievable. COMPARISON: None available. CLINICAL HISTORY: fall. Pt coming from home with bg of 446. Pt non-compliant with his insulin . Pt had a syncopal episode at home witnessed by mother. FINDINGS: BRAIN AND VENTRICLES: No acute hemorrhage. No evidence of acute infarct. No hydrocephalus. No extra-axial collection. No mass effect or midline shift. ORBITS: No acute abnormality. SINUSES: No acute abnormality. SOFT TISSUES AND SKULL: No acute soft tissue abnormality. No skull fracture. IMPRESSION: 1. No acute intracranial abnormality. Electronically signed by: Pinkie Pebbles MD 03/10/2024 12:12 AM EDT RP Workstation: [id number]   CT CHEST ABDOMEN PELVIS W CONTRAST Result Date: 03/10/2024 EXAM: CT CHEST, ABDOMEN AND PELVIS WITH CONTRAST 03/10/2024 12:07:14 AM TECHNIQUE: CT of the chest, abdomen and pelvis was performed with the administration of 100 mL of iohexol (OMNIPAQUE) 350 MG/ML injection. Multiplanar reformatted images are provided for review. Automated exposure control, iterative reconstruction, and/or weight based adjustment of the mA/kV was utilized to reduce the radiation dose to as low as reasonably achievable. COMPARISON: None available. CLINICAL HISTORY: Abdominal pain, acute, nonlocalized. Pt coming from home with bg of 446. Pt non-compliant with his insulin . Pt had a syncopal episode at home witnessed by mother. FINDINGS: CHEST: MEDIASTINUM AND LYMPH NODES: Heart and pericardium are unremarkable. The central airways are clear. No mediastinal, hilar or axillary lymphadenopathy. LUNGS AND PLEURA: Mild bilateral lower lobe atelectasis. No focal consolidation or pulmonary edema. No pleural effusion or pneumothorax. ABDOMEN AND PELVIS: LIVER: The liver is unremarkable. GALLBLADDER AND BILE DUCTS: Layering gallstones (image 69), without  associated inflammatory changes. No biliary ductal dilatation. SPLEEN: No acute abnormality. PANCREAS: No acute abnormality. ADRENAL GLANDS: No acute abnormality. KIDNEYS, URETERS AND BLADDER: No stones in the kidneys or ureters. No hydronephrosis. No perinephric or periureteral stranding. Urinary bladder is unremarkable. GI AND BOWEL: Stomach demonstrates no acute abnormality. Mild to moderate colonic stool burden. Normal appendix (image 109). There is no bowel obstruction. REPRODUCTIVE ORGANS: The prostate is unremarkable. PERITONEUM AND RETROPERITONEUM: No ascites. No free air. VASCULATURE: Aorta is normal in caliber. ABDOMINAL AND PELVIS LYMPH NODES: No lymphadenopathy. BONES AND SOFT TISSUES: Mild degenerative changes of the thoracolumbar spine. No focal soft tissue abnormality. IMPRESSION: 1. No acute findings. 2. Cholelithiasis, without inflammatory changes. Electronically signed by: Pinkie Pebbles MD 03/10/2024 12:11 AM EDT RP Workstation: [id number]   DG Knee 1-2 Views Right Result Date: 03/09/2024 EXAM: 2 VIEW(S) XRAY OF THE KNEE 03/09/2024 08:49:37 PM COMPARISON: None available. CLINICAL HISTORY: Knee pain FINDINGS: BONES AND JOINTS: No acute fracture. No focal osseous lesion. No joint dislocation. No significant joint effusion. Small patellar spurs at the quadriceps and patellar tendon insertions. Mild degenerative changes in the patellofemoral compartment. SOFT TISSUES: The soft tissues are unremarkable. IMPRESSION: 1. No acute findings. Electronically signed  by: Pinkie Pebbles MD 03/09/2024 08:55 PM EDT RP Workstation: [id number]   DG Foot Complete Left Result Date: 03/09/2024 CLINICAL DATA:  sepsis EXAM: LEFT FOOT - COMPLETE 3+ VIEW COMPARISON:  02/18/2023 FINDINGS: Osteopenia.No acute fracture or dislocation. Prior amputation of the great toe. Similar cortical thickening of the second and third metatarsal shaft. Chronic changes of the fifth metatarsal head with osteophyte formation at the  PIP joint. Moderate degenerative changes of the second PIP joint. No radiopaque foreign body. No subcutaneous gas. IMPRESSION: Diffuse osteopenia. No subcutaneous gas or radiographic changes of osteomyelitis, at this time. Electronically Signed   By: Rogelia Myers M.D.   On: 03/09/2024 17:31   DG Chest Port 1 View Result Date: 03/09/2024 CLINICAL DATA:  Hyperglycemia. EXAM: PORTABLE CHEST 1 VIEW COMPARISON:  December 10, 2022 FINDINGS: The heart size and mediastinal contours are within normal limits. Low lung volumes are noted with mild areas of atelectasis and/or infiltrate seen within the bilateral lung bases. No pleural effusion or pneumothorax is identified. The visualized skeletal structures are unremarkable. IMPRESSION: Low lung volumes with mild bibasilar atelectasis and/or infiltrate. Electronically Signed   By: Suzen Dials M.D.   On: 03/09/2024 15:52     I spent 85 minutes involved in face-to-face and non-face-to-face activities for this patient on the day of the visit. Professional time spent includes the following activities: Preparing to see the patient (review of tests), Obtaining and reviewing separately obtained history (admission/discharge record), Performing a medically appropriate examination and evaluation , Ordering medications/labs, referring and communicating with other health care professionals, Documenting clinical information in the EMR, Independently interpreting results (not separately reported), Communicating results to the patient/family, Counseling and educating the patient/family and Care coordination (not separately reported).  Electronically signed by:   Plan d/w requesting provider as well as ID pharm D  Of note, portions of this note may have been created with voice recognition software. While this note has been edited for accuracy, occasional wrong-word or 'sound-a-like' substitutions may have occurred due to the inherent limitations of voice recognition software.    Annalee Orem, MD Infectious Disease Physician [facility] for Infectious Disease Pager: [phone]

## 2024-03-11 NOTE — Inpatient Diabetes Management (Signed)
 Inpatient Diabetes Program Recommendations  AACE/ADA: New Consensus Statement on Inpatient Glycemic Control (2015)  Target Ranges:  Prepandial:   less than 140 mg/dL      Peak postprandial:   less than 180 mg/dL (1-2 hours)      Critically ill patients:  140 - 180 mg/dL   Lab Results  Component Value Date   GLUCAP 240 (H) 03/11/2024   HGBA1C 12.9 (H) 03/10/2024    Review of Glycemic Control  Latest Reference Range & Units 03/10/24 07:50 03/10/24 11:51 03/10/24 16:28 03/10/24 21:08 03/11/24 06:12  Glucose-Capillary 70 - 99 mg/dL 677 (H) 739 (H) 743 (H) 268 (H) 240 (H)   Diabetes history: DM 2 Outpatient Diabetes medications: Basaglar  60 units qhs, Novolog  5 units tid, Metformin  2000 mg Daily Current orders for Inpatient glycemic control:  Semglee  20 units qhs Novolog  0-15 units tid  Novolog  4 units tid meal coverage  Inpatient Diabetes Program Recommendations:    -   Increase Semglee  to 30 units (1/2 home dose)  No insurance Depressed not taking meds Financial concerns Depression will need to be addressed to optimize glucose control outpatient  Will see and follow up with pt today Will need CM/SW consult for medication assistance Follow up with PCP  Addendum 9:50 am: Spoke with pt on 10/17  May need match and then clinic follow up for assistance in filling out documentation for medication assistance program. During this time pt will also need to inquire about his insurance status.  Thanks,  Clotilda Bull RN, MSN, BC-ADM Inpatient Diabetes Coordinator Team Pager [phone] (8a-5p)

## 2024-03-12 DIAGNOSIS — L089 Local infection of the skin and subcutaneous tissue, unspecified: Secondary | ICD-10-CM

## 2024-03-12 DIAGNOSIS — B9561 Methicillin susceptible Staphylococcus aureus infection as the cause of diseases classified elsewhere: Secondary | ICD-10-CM | POA: Diagnosis present

## 2024-03-12 DIAGNOSIS — L97529 Non-pressure chronic ulcer of other part of left foot with unspecified severity: Secondary | ICD-10-CM

## 2024-03-12 DIAGNOSIS — A4101 Sepsis due to Methicillin susceptible Staphylococcus aureus: Secondary | ICD-10-CM

## 2024-03-12 DIAGNOSIS — R7881 Bacteremia: Secondary | ICD-10-CM | POA: Diagnosis present

## 2024-03-12 DIAGNOSIS — R6520 Severe sepsis without septic shock: Secondary | ICD-10-CM

## 2024-03-12 DIAGNOSIS — E11621 Type 2 diabetes mellitus with foot ulcer: Secondary | ICD-10-CM

## 2024-03-12 LAB — GLUCOSE, CAPILLARY
Glucose-Capillary: 224 mg/dL — ABNORMAL HIGH (ref 70–99)
Glucose-Capillary: 169 mg/dL — ABNORMAL HIGH (ref 70–99)
Glucose-Capillary: 243 mg/dL — ABNORMAL HIGH (ref 70–99)
Glucose-Capillary: 272 mg/dL — ABNORMAL HIGH (ref 70–99)

## 2024-03-12 MED ORDER — INSULIN GLARGINE-YFGN 100 UNIT/ML ~~LOC~~ SOLN
30.0000 [IU] | Freq: Every day | SUBCUTANEOUS | Status: DC
  Administered 2024-03-12 – 2024-03-15 (×4): 30 [IU] via SUBCUTANEOUS
  Filled 2024-03-12 (×5): qty 0.3

## 2024-03-12 NOTE — Progress Notes (Signed)
 PROGRESS NOTE    [name]  FMW:[id number] DOB: [birth year] DOA: 03/09/2024 PCP: Vicci Barnie NOVAK, MD  Outpatient Specialists:     Brief Narrative:  Patient is a 43 year old male with past medical history significant for diabetes mellitus type 2 and depression.  Patient did not take his medications for the last few months, possibly, related to depressive illness.  Patient could not also afford some of the medications.  Patient presented with fever, chills and left foot ulcer that has been unattended to for about a year.  MRI reported likely osteomyelitis.  Blood cultures growing MSSA.  TTE is nonrevealing.  For likely TEE (as per infectious disease team).  Orthopedic team and wound care have been consulted.  Patient is currently on IV cefazolin .    03/10/2024: Patient seen alongside patient's mother.  Patient reports that he has had the left foot ulcer for over 1 year.  Psychiatry input is appreciated.  Continue antibiotics for now.  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  03/11/2024: Patient seen alongside patient's aunt.  Patient continues to improve.  No new complaints today.  Will consult wound care team.  Will have a low threshold to consult orthopedic team.  Follow wound cultures.  Blood cultures growing Staphylococcus species.  03/12/2024: Patient seen.  No new complaints.  Wound care input is appreciated.  Infectious disease team input is appreciated.  Awaiting orthopedic surgery input.   Assessment & Plan:   Principal Problem:   Sepsis (HCC) Active Problems:   Uncontrolled type 2 diabetes mellitus with hyperglycemia (HCC)   Major depressive disorder, recurrent episode, moderate (HCC)   MSSA bacteremia   Sepsis/MSSA bacteremia:  -Likely secondary to infected left foot ulcer. - Patient has had the ulcer for greater than 1 year. - MRI of the foot revealed chronic osteomyelitis.  -Blood culture culture grew MSSA.   - Follow wound culture. - Patient is currently  on IV cefazolin  2 g every 8 hours.    Uncontrolled diabetes mellitus type 2: -A1c of 12.9%. - Patient has been noncompliant. - Input from diabetic educator is highly appreciated.   - Continue to monitor blood sugar and adjust insulin  accordingly.   - Increase subcutaneous Semglee  from 20 units every night to 30 units every night, subcutaneous NovoLog  4 units with meals 3 times daily and sliding scale insulin  coverage.   Acute renal failure: - Likely prerenal. - Resolved with hydration.  Syncope: - Likely from dehydration.  Regain consciousness within a few seconds and felt weak while the episode happened in a standing position.   -Echo is not revealing.    Depression: - Psychiatric input is appreciated.   -- Resume sertraline .  Cholelithiasis:  DVT prophylaxis: Lovenox  Code Status: Full code Family Communication: Mother Disposition Plan: Patient remains inpatient for now.   Consultants:  Psychiatry Have low threshold to consult infectious disease and orthopedic surgery.  Procedures:  None.  Antimicrobials:  IV vancomycin  discontinued. IV cefepime  discontinued. IV cefazolin .   Subjective: No new complaints  Objective: Vitals:   03/12/24 0358 03/12/24 0756 03/12/24 1219 03/12/24 1608  BP: (!) 134/92 (!) 142/93 131/88 136/86  Pulse: 78 86 77 82  Resp:  18 18 18   Temp: 97.8 F (36.6 C) 98.1 F (36.7 C) 98.1 F (36.7 C) 98.6 F (37 C)  TempSrc: Oral Oral Oral Oral  SpO2: 98% 99% 99% 99%  Weight:      Height:        Intake/Output Summary (Last 24 hours) at 03/12/2024 1936 Last  data filed at 03/12/2024 0832 Gross per 24 hour  Intake --  Output 1000 ml  Net -1000 ml   Filed Weights   03/09/24 1501  Weight: (!) 149.7 kg    Examination:  General exam: Appears calm and comfortable.  Patient is morbidly obese. Respiratory system: Clear to auscultation. Respiratory effort normal. Cardiovascular system: S1 & S2 heard Gastrointestinal system: Abdomen is  obese, soft and nontender.   Central nervous system: Awake and alert.   Extremities: Left foot ulcer.  Cannot rule out diabetic/vascular ulcers, infected          Data Reviewed: I have personally reviewed following labs and imaging studies  CBC: Recent Labs  Lab 03/09/24 1505 03/09/24 1758 03/09/24 1759 03/10/24 0110 03/11/24 0608  WBC 12.1*  --   --  10.0 7.6  NEUTROABS 10.5*  --   --   --  4.9  HGB 16.1 14.3 15.0 14.1 13.2  HCT 47.5 42.0 44.0 43.1 39.3  MCV 83.9  --   --  86.4 84.2  PLT 188  --   --  156 146*   Basic Metabolic Panel: Recent Labs  Lab 03/09/24 1505 03/09/24 1758 03/09/24 1759 03/10/24 0110 03/11/24 0608  NA 134* 138 139 136 136  K 3.9 3.8 3.8 3.7 3.6  CL 98  --  102 105 103  CO2 20*  --   --  20* 20*  GLUCOSE 446*  --  427* 340* 231*  BUN 13  --  14 12 8   CREATININE 1.25*  --  1.00 0.94 0.76  CALCIUM  8.7*  --   --  8.3* 8.2*  MG  --   --   --   --  1.9  PHOS  --   --   --   --  2.2*   GFR: Estimated Creatinine Clearance: 184.3 mL/min (by C-G formula based on SCr of 0.76 mg/dL). Liver Function Tests: Recent Labs  Lab 03/09/24 1505 03/10/24 0110 03/11/24 0608  AST 29 20  --   ALT 22 19  --   ALKPHOS 106 81  --   BILITOT 0.8 1.1  --   PROT 7.1 6.1*  --   ALBUMIN 3.7 3.0* 2.9*   Recent Labs  Lab 03/09/24 1505  LIPASE 20   No results for input(s): AMMONIA in the last 168 hours. Coagulation Profile: Recent Labs  Lab 03/09/24 1505  INR 1.1   Cardiac Enzymes: No results for input(s): CKTOTAL, CKMB, CKMBINDEX, TROPONINI in the last 168 hours. BNP (last 3 results) No results for input(s): PROBNP in the last 8760 hours. HbA1C: Recent Labs    03/10/24 0110  HGBA1C 12.9*   CBG: Recent Labs  Lab 03/11/24 1644 03/11/24 2152 03/12/24 0633 03/12/24 1218 03/12/24 1606  GLUCAP 282* 266* 224* 169* 243*   Lipid Profile: No results for input(s): CHOL, HDL, LDLCALC, TRIG, CHOLHDL, LDLDIRECT in the last  72 hours. Thyroid Function Tests: Recent Labs    03/10/24 0507  TSH 0.956   Anemia Panel: Recent Labs    03/11/24 0608  VITAMINB12 366   Urine analysis:    Component Value Date/Time   COLORURINE STRAW (A) 03/09/2024 1808   APPEARANCEUR CLEAR 03/09/2024 1808   LABSPEC 1.021 03/09/2024 1808   PHURINE 6.0 03/09/2024 1808   GLUCOSEU >=500 (A) 03/09/2024 1808   HGBUR NEGATIVE 03/09/2024 1808   BILIRUBINUR NEGATIVE 03/09/2024 1808   BILIRUBINUR negative 08/10/2016 1116   KETONESUR 20 (A) 03/09/2024 1808   PROTEINUR NEGATIVE 03/09/2024 1808  UROBILINOGEN 0.2 08/10/2016 1116   NITRITE NEGATIVE 03/09/2024 1808   LEUKOCYTESUR NEGATIVE 03/09/2024 1808   Sepsis Labs: @LABRCNTIP (procalcitonin:4,lacticidven:4)  ) Recent Results (from the past 240 hours)  Resp panel by RT-PCR (RSV, Flu A&B, Covid) Anterior Nasal Swab     Status: None   Collection Time: 03/09/24  3:06 PM   Specimen: Anterior Nasal Swab  Result Value Ref Range Status   SARS Coronavirus 2 by RT PCR NEGATIVE NEGATIVE Final   Influenza A by PCR NEGATIVE NEGATIVE Final   Influenza B by PCR NEGATIVE NEGATIVE Final    Comment: (NOTE) The Xpert Xpress SARS-CoV-2/FLU/RSV plus assay is intended as an aid in the diagnosis of influenza from Nasopharyngeal swab specimens and should not be used as a sole basis for treatment. Nasal washings and aspirates are unacceptable for Xpert Xpress SARS-CoV-2/FLU/RSV testing.  Fact Sheet for Patients: BloggerCourse.com  Fact Sheet for Healthcare Providers: SeriousBroker.it  This test is not yet approved or cleared by the [place]  FDA and has been authorized for detection and/or diagnosis of SARS-CoV-2 by FDA under an Emergency Use Authorization (EUA). This EUA will remain in effect (meaning this test can be used) for the duration of the COVID-19 declaration under Section 564(b)(1) of the Act, 21 U.S.C. section 360bbb-3(b)(1),  unless the authorization is terminated or revoked.     Resp Syncytial Virus by PCR NEGATIVE NEGATIVE Final    Comment: (NOTE) Fact Sheet for Patients: BloggerCourse.com  Fact Sheet for Healthcare Providers: SeriousBroker.it  This test is not yet approved or cleared by the [place]  FDA and has been authorized for detection and/or diagnosis of SARS-CoV-2 by FDA under an Emergency Use Authorization (EUA). This EUA will remain in effect (meaning this test can be used) for the duration of the COVID-19 declaration under Section 564(b)(1) of the Act, 21 U.S.C. section 360bbb-3(b)(1), unless the authorization is terminated or revoked.  Performed at [facility], [address], [place], [place] [address]   Culture, blood (Routine x 2)     Status: Abnormal (Preliminary result)   Collection Time: 03/09/24  3:39 PM   Specimen: BLOOD RIGHT HAND  Result Value Ref Range Status   Specimen Description BLOOD RIGHT HAND  Final   Special Requests   Final    BOTTLES DRAWN AEROBIC AND ANAEROBIC Blood Culture results may not be optimal due to an inadequate volume of blood received in culture bottles   Culture  Setup Time   Final    GRAM POSITIVE COCCI AEROBIC BOTTLE ONLY GREG ABBOTT PHARM D 03/11/2024 @ 0219 BY DD    Culture (A)  Final    STAPHYLOCOCCUS AUREUS SUSCEPTIBILITIES TO FOLLOW Performed at [facility], [address], [place], [place] [address]    Report Status PENDING  Incomplete  Blood Culture ID Panel (Reflexed)     Status: Abnormal   Collection Time: 03/09/24  3:39 PM  Result Value Ref Range Status   Enterococcus faecalis NOT DETECTED NOT DETECTED Final   Enterococcus Faecium NOT DETECTED NOT DETECTED Final   Listeria monocytogenes NOT DETECTED NOT DETECTED Final   Staphylococcus species DETECTED (A) NOT DETECTED Final    Comment: CRITICAL RESULT CALLED TO, READ BACK BY AND VERIFIED WITH: GREG ABBOTT PHARM D  03/11/2024 @ 0219 BY DD    Staphylococcus aureus (BCID) DETECTED (A) NOT DETECTED Final    Comment: CRITICAL RESULT CALLED TO, READ BACK BY AND VERIFIED WITH: GREG ABBOTT PHARM D 03/11/2024 @ 0219 BY DD  Staphylococcus epidermidis NOT DETECTED NOT DETECTED Final   Staphylococcus lugdunensis NOT DETECTED NOT DETECTED Final   Streptococcus species NOT DETECTED NOT DETECTED Final   Streptococcus agalactiae NOT DETECTED NOT DETECTED Final   Streptococcus pneumoniae NOT DETECTED NOT DETECTED Final   Streptococcus pyogenes NOT DETECTED NOT DETECTED Final   A.calcoaceticus-baumannii NOT DETECTED NOT DETECTED Final   Bacteroides fragilis NOT DETECTED NOT DETECTED Final   Enterobacterales NOT DETECTED NOT DETECTED Final   Enterobacter cloacae complex NOT DETECTED NOT DETECTED Final   Escherichia coli NOT DETECTED NOT DETECTED Final   Klebsiella aerogenes NOT DETECTED NOT DETECTED Final   Klebsiella oxytoca NOT DETECTED NOT DETECTED Final   Klebsiella pneumoniae NOT DETECTED NOT DETECTED Final   Proteus species NOT DETECTED NOT DETECTED Final   Salmonella species NOT DETECTED NOT DETECTED Final   Serratia marcescens NOT DETECTED NOT DETECTED Final   Haemophilus influenzae NOT DETECTED NOT DETECTED Final   Neisseria meningitidis NOT DETECTED NOT DETECTED Final   Pseudomonas aeruginosa NOT DETECTED NOT DETECTED Final   Stenotrophomonas maltophilia NOT DETECTED NOT DETECTED Final   Candida albicans NOT DETECTED NOT DETECTED Final   Candida auris NOT DETECTED NOT DETECTED Final   Candida glabrata NOT DETECTED NOT DETECTED Final   Candida krusei NOT DETECTED NOT DETECTED Final   Candida parapsilosis NOT DETECTED NOT DETECTED Final   Candida tropicalis NOT DETECTED NOT DETECTED Final   Cryptococcus neoformans/gattii NOT DETECTED NOT DETECTED Final   Meth resistant mecA/C and MREJ NOT DETECTED NOT DETECTED Final    Comment: Performed at [facility], [address], [place], [place]  [address]  Culture, blood (Routine x 2)     Status: None (Preliminary result)   Collection Time: 03/09/24  7:27 PM   Specimen: BLOOD LEFT HAND  Result Value Ref Range Status   Specimen Description BLOOD LEFT HAND  Final   Special Requests   Final    BOTTLES DRAWN AEROBIC ONLY Blood Culture results may not be optimal due to an inadequate volume of blood received in culture bottles   Culture   Final    NO GROWTH 3 DAYS Performed at [facility], [address], [place], [place] [address]    Report Status PENDING  Incomplete  Culture, blood (Routine X 2) w Reflex to ID Panel     Status: None (Preliminary result)   Collection Time: 03/11/24  9:09 AM   Specimen: BLOOD LEFT HAND  Result Value Ref Range Status   Specimen Description BLOOD LEFT HAND  Final   Special Requests   Final    BOTTLES DRAWN AEROBIC AND ANAEROBIC Blood Culture results may not be optimal due to an inadequate volume of blood received in culture bottles   Culture   Final    NO GROWTH < 24 HOURS Performed at [facility], [address] [place], [place] [address]    Report Status PENDING  Incomplete  Culture, blood (Routine X 2) w Reflex to ID Panel     Status: None (Preliminary result)   Collection Time: 03/11/24  9:16 AM   Specimen: BLOOD  Result Value Ref Range Status   Specimen Description BLOOD LEFT ANTECUBITAL  Final   Special Requests   Final    BOTTLES DRAWN AEROBIC AND ANAEROBIC Blood Culture results may not be optimal due to an inadequate volume of blood received in culture bottles   Culture   Final    NO GROWTH < 24 HOURS Performed at [facility], [address] [place], [place] [address]    Report Status PENDING  Incomplete         Radiology Studies: No results found.       Scheduled Meds:  heparin   5,000 Units Subcutaneous Q8H   insulin  aspart  0-15 Units Subcutaneous TID WC   insulin  aspart  4 Units Subcutaneous TID WC   insulin  glargine-yfgn  20 Units Subcutaneous QHS    sertraline   25 mg Oral Daily   Continuous Infusions:   ceFAZolin  (ANCEF ) IV 2 g (03/12/24 1305)     LOS: 3 days    Time spent: 35 minutes    Leatrice Chapel, MD  [facility] Pager #: [phone] 7PM-7AM contact night coverage as above

## 2024-03-12 NOTE — Consult Note (Signed)
 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    Abnormal MRI; consider consultation with podietry    Re consult if needed, will not follow at this time. Thanks  Marvelous Woolford [employer], RN,CWOCN, CNS, [employer] [phone]

## 2024-03-13 ENCOUNTER — Inpatient Hospital Stay (HOSPITAL_COMMUNITY): Payer: MEDICAID

## 2024-03-13 DIAGNOSIS — L8990 Pressure ulcer of unspecified site, unspecified stage: Secondary | ICD-10-CM | POA: Insufficient documentation

## 2024-03-13 DIAGNOSIS — M86372 Chronic multifocal osteomyelitis, left ankle and foot: Secondary | ICD-10-CM

## 2024-03-13 DIAGNOSIS — L89890 Pressure ulcer of other site, unstageable: Secondary | ICD-10-CM

## 2024-03-13 DIAGNOSIS — I7090 Unspecified atherosclerosis: Secondary | ICD-10-CM

## 2024-03-13 DIAGNOSIS — M869 Osteomyelitis, unspecified: Secondary | ICD-10-CM

## 2024-03-13 LAB — CBC WITH DIFFERENTIAL/PLATELET
Abs Immature Granulocytes: 0.04 K/uL (ref 0.00–0.07)
Basophils Absolute: 0.1 K/uL (ref 0.0–0.1)
Basophils Relative: 1 %
Eosinophils Absolute: 0.2 K/uL (ref 0.0–0.5)
Eosinophils Relative: 2 %
HCT: 39.7 % (ref 39.0–52.0)
Hemoglobin: 13.2 g/dL (ref 13.0–17.0)
Immature Granulocytes: 1 %
Lymphocytes Relative: 39 %
Lymphs Abs: 2.8 K/uL (ref 0.7–4.0)
MCH: 28.2 pg (ref 26.0–34.0)
MCHC: 33.2 g/dL (ref 30.0–36.0)
MCV: 84.8 fL (ref 80.0–100.0)
Monocytes Absolute: 0.7 K/uL (ref 0.1–1.0)
Monocytes Relative: 9 %
Neutro Abs: 3.4 K/uL (ref 1.7–7.7)
Neutrophils Relative %: 48 %
Platelets: 209 K/uL (ref 150–400)
RBC: 4.68 MIL/uL (ref 4.22–5.81)
RDW: 13.2 % (ref 11.5–15.5)
WBC: 7.1 K/uL (ref 4.0–10.5)
nRBC: 0 % (ref 0.0–0.2)

## 2024-03-13 LAB — RENAL FUNCTION PANEL
Albumin: 2.8 g/dL — ABNORMAL LOW (ref 3.5–5.0)
Anion gap: 8 (ref 5–15)
BUN: 8 mg/dL (ref 6–20)
CO2: 26 mmol/L (ref 22–32)
Calcium: 8.6 mg/dL — ABNORMAL LOW (ref 8.9–10.3)
Chloride: 102 mmol/L (ref 98–111)
Creatinine, Ser: 0.68 mg/dL (ref 0.61–1.24)
GFR, Estimated: >60 mL/min (ref >60)
Glucose, Bld: 238 mg/dL — ABNORMAL HIGH (ref 70–99)
Phosphorus: 3 mg/dL (ref 2.5–4.6)
Potassium: 3.8 mmol/L (ref 3.5–5.1)
Sodium: 136 mmol/L (ref 135–145)

## 2024-03-13 LAB — CULTURE, BLOOD (ROUTINE X 2)

## 2024-03-13 LAB — GLUCOSE, CAPILLARY
Glucose-Capillary: 243 mg/dL — ABNORMAL HIGH (ref 70–99)
Glucose-Capillary: 197 mg/dL — ABNORMAL HIGH (ref 70–99)
Glucose-Capillary: 239 mg/dL — ABNORMAL HIGH (ref 70–99)
Glucose-Capillary: 306 mg/dL — ABNORMAL HIGH (ref 70–99)

## 2024-03-13 LAB — MAGNESIUM: Magnesium: 2 mg/dL (ref 1.7–2.4)

## 2024-03-13 NOTE — Progress Notes (Signed)
 CSW added resources to patient's AVS for outpatient mental health counseling and medication management, including locations that offer sliding scale fee and assistance for patients without insurance coverage.  Almarie Goodie, [place] Clinical Social Worker [phone]

## 2024-03-13 NOTE — TOC Initial Note (Addendum)
 Transition of Care [facility]) - Initial/Assessment Note    Patient Details  Name: [name] MRN: [id number] Date of Birth: 1981-01-25  Transition of Care [facility]) CM/SW Contact:    Andrez JULIANNA George, RN Phone Number: 03/13/2024, 1:09 PM  Clinical Narrative:                 [name] is a 43 y.o. male with history of diabetes mellitus type 2 and depression presently not taking medications for last few months as patient states he is depressed and also was not able to afford some of them presents to the ER after patient was having fever and chills since morning.  Patient states he woke up this morning with fever and chills with no associated productive cough abdominal pain chest pain or diarrhea.  Pt is from home with his parents. They are with him most of the time.  Pt drives self and manages his own medications. He does admit to medication non compliance due to depression. He states he knows he needs to take as prescribed and plans on doing better after d/c.  No health insurance but is active with [facility].   Ip care management following.  Expected Discharge Plan: Home/Self Care     Patient Goals and CMS Choice            Expected Discharge Plan and Services   Discharge Planning Services: CM Consult   Living arrangements for the past 2 months: Single Family Home                                      Prior Living Arrangements/Services Living arrangements for the past 2 months: Single Family Home Lives with:: Parents Patient language and need for interpreter reviewed:: Yes Do you feel safe going back to the place where you live?: Yes            Criminal Activity/Legal Involvement Pertinent to Current Situation/Hospitalization: No - Comment as needed  Activities of Daily Living   ADL Screening (condition at time of admission) Is the patient deaf or have difficulty hearing?: No Does the patient have difficulty seeing, even when wearing glasses/contacts?:  No Does the patient have difficulty concentrating, remembering, or making decisions?: Yes  Permission Sought/Granted                  Emotional Assessment Appearance:: Appears stated age Attitude/Demeanor/Rapport: Engaged Affect (typically observed): Accepting Orientation: : Oriented to Self, Oriented to Place, Oriented to Situation, Oriented to  Time   Psych Involvement: No (comment)  Admission diagnosis:  Hyperglycemia [R73.9] Sepsis (HCC) [A41.9] Sepsis, due to unspecified organism, unspecified whether acute organ dysfunction present [facility]) [A41.9] Patient Active Problem List   Diagnosis Date Noted   Pressure injury of skin 03/13/2024   MSSA bacteremia 03/12/2024   Sepsis (HCC) 03/09/2024   Diabetic foot infection (HCC) 02/26/2023   MDD (major depressive disorder), recurrent, in partial remission 11/19/2021   Osteomyelitis (HCC)    Bipolar depression (HCC) 09/10/2021   Adjustment disorder with mixed anxiety and depressed mood 09/10/2021   Attention deficit hyperactivity disorder (ADHD), predominantly inattentive type 07/30/2020   Anxiety with depression 06/07/2020   Hyperlipidemia due to type 2 diabetes mellitus (HCC) 03/27/2020   Status post amputation of left great toe 03/26/2020   Influenza vaccination declined 03/26/2020   23-polyvalent pneumococcal polysaccharide vaccine declined 03/26/2020   COVID-19 vaccine series completed 03/26/2020  Moderate major depression, single episode (HCC) 03/26/2020   Anaerobic bacteremia 03/07/2020   Major depressive disorder, recurrent episode, moderate (HCC) 03/04/2020   Abscess of great toe, left    Sepsis due to group B Streptococcus with acute renal failure (HCC)    Cellulitis of left toe 03/03/2020   Diabetic ketoacidosis (HCC) 03/03/2020   DKA (diabetic ketoacidosis) (HCC) 03/03/2020   Ulcer of right foot with fat layer exposed (HCC) 06/06/2019   Non-pressure chronic ulcer of other part of left foot limited to breakdown of  skin (HCC) 06/06/2019   Uncontrolled type 2 diabetes mellitus with hyperglycemia (HCC) 05/02/2018   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) 07/30/2017   Venous insufficiency of both lower extremities 07/30/2017   Obesity, Class III, BMI 40-49.9 (morbid obesity) (HCC) 07/30/2017   Essential hypertension 09/03/2016   PCP:  Vicci Barnie NOVAK, MD Pharmacy:   [facility] [address] [place] [address] Phone: [phone] Fax: [phone]  [facility] [address], [address] [place] [address] Phone: [phone] Fax: [phone]     Social Drivers of Health (SDOH) Social History: SDOH Screenings   Food Insecurity: No Food Insecurity (03/10/2024)  Housing: Low Risk  (03/10/2024)  Transportation Needs: No Transportation Needs (03/10/2024)  Utilities: Not At Risk (03/10/2024)  Alcohol Screen: Low Risk  (04/06/2023)  Depression (PHQ2-9): Medium Risk (04/06/2023)  Financial Resource Strain: Patient Declined (05/24/2023)   Received from [facility]  Recent Concern: Financial Resource Strain - High Risk (04/06/2023)  Physical Activity: Inactive (04/06/2023)  Social Connections: Socially Isolated (04/06/2023)  Stress: Stress Concern Present (04/06/2023)  Tobacco Use: Low Risk  (03/09/2024)  Health Literacy: Adequate Health Literacy (03/02/2023)   SDOH Interventions:     Readmission Risk Interventions     No data to display

## 2024-03-13 NOTE — Plan of Care (Signed)

## 2024-03-13 NOTE — Progress Notes (Signed)
 Subjective: No new complaints   Antibiotics:  Anti-infectives (From admission, onward)    Start     Dose/Rate Route Frequency Ordered Stop   03/11/24 0600  ceFAZolin  (ANCEF ) IVPB 2g/100 mL premix        2 g 200 mL/hr over 30 Minutes Intravenous Every 8 hours 03/11/24 0238     03/10/24 0900  vancomycin  (VANCOREADY) IVPB 1750 mg/350 mL  Status:  Discontinued        1,750 mg 175 mL/hr over 120 Minutes Intravenous Every 12 hours 03/09/24 2257 03/11/24 0238   03/10/24 0500  metroNIDAZOLE  (FLAGYL ) IVPB 500 mg  Status:  Discontinued        500 mg 100 mL/hr over 60 Minutes Intravenous Every 12 hours 03/09/24 2246 03/11/24 0238   03/10/24 0000  ceFEPIme  (MAXIPIME ) 2 g in sodium chloride  0.9 % 100 mL IVPB  Status:  Discontinued        2 g 200 mL/hr over 30 Minutes Intravenous  Once 03/09/24 2246 03/09/24 2257   03/09/24 2300  vancomycin  (VANCOCIN ) IVPB 1000 mg/200 mL premix  Status:  Discontinued        1,000 mg 200 mL/hr over 60 Minutes Intravenous  Once 03/09/24 2246 03/09/24 2248   03/09/24 2256  ceFEPIme  (MAXIPIME ) 2 g in sodium chloride  0.9 % 100 mL IVPB  Status:  Discontinued        2 g 200 mL/hr over 30 Minutes Intravenous Every 8 hours 03/09/24 2257 03/11/24 0238   03/09/24 1515  ceFEPIme  (MAXIPIME ) 2 g in sodium chloride  0.9 % 100 mL IVPB        2 g 200 mL/hr over 30 Minutes Intravenous  Once 03/09/24 1506 03/09/24 1637   03/09/24 1515  metroNIDAZOLE  (FLAGYL ) IVPB 500 mg        500 mg 100 mL/hr over 60 Minutes Intravenous  Once 03/09/24 1506 03/09/24 1800   03/09/24 1515  vancomycin  (VANCOCIN ) IVPB 1000 mg/200 mL premix  Status:  Discontinued        1,000 mg 200 mL/hr over 60 Minutes Intravenous  Once 03/09/24 1506 03/09/24 1511   03/09/24 1515  vancomycin  (VANCOCIN ) 2,500 mg in sodium chloride  0.9 % 500 mL IVPB        2,500 mg 262.5 mL/hr over 120 Minutes Intravenous  Once 03/09/24 1511 03/09/24 2304       Medications: Scheduled Meds:  heparin   5,000 Units  Subcutaneous Q8H   insulin  aspart  0-15 Units Subcutaneous TID WC   insulin  aspart  4 Units Subcutaneous TID WC   insulin  glargine-yfgn  30 Units Subcutaneous QHS   sertraline   25 mg Oral Daily   Continuous Infusions:   ceFAZolin  (ANCEF ) IV 2 g (03/13/24 0637)   PRN Meds:.    Objective: Weight change:  No intake or output data in the 24 hours ending 03/13/24 1223 Blood pressure (!) 139/93, pulse 78, temperature 97.9 F (36.6 C), temperature source Oral, resp. rate 19, height 6' 3 (1.905 m), weight (!) 149.7 kg, SpO2 100%. Temp:  [97.8 F (36.6 C)-98.6 F (37 C)] 97.9 F (36.6 C) (10/20 1131) Pulse Rate:  [74-86] 78 (10/20 1131) Resp:  [17-19] 19 (10/20 1131) BP: (111-140)/(80-93) 139/93 (10/20 1131) SpO2:  [98 %-100 %] 100 % (10/20 1131)  Physical Exam: Physical Exam Constitutional:      General: He is not in acute distress.    Appearance: Normal appearance. He is not ill-appearing, toxic-appearing or diaphoretic.  Cardiovascular:  Heart sounds: No murmur heard.    No friction rub. No gallop.  Pulmonary:     Effort: No respiratory distress.     Breath sounds: No stridor. No wheezing or rhonchi.  Abdominal:     General: Bowel sounds are normal. There is no distension.  Skin:    General: Skin is warm and dry.  Neurological:     General: No focal deficit present.     Mental Status: He is alert and oriented to person, place, and time.  Psychiatric:        Mood and Affect: Mood normal.        Behavior: Behavior normal.        Thought Content: Thought content normal.        Judgment: Judgment normal.      Left lower leg 03/13/2024:           CBC:    BMET Recent Labs    03/11/24 0608 03/13/24 0536  NA 136 136  K 3.6 3.8  CL 103 102  CO2 20* 26  GLUCOSE 231* 238*  BUN 8 8  CREATININE 0.76 0.68  CALCIUM  8.2* 8.6*     Liver Panel  Recent Labs    03/11/24 0608 03/13/24 0536  ALBUMIN 2.9* 2.8*       Sedimentation Rate No results  for input(s): ESRSEDRATE in the last 72 hours. C-Reactive Protein No results for input(s): CRP in the last 72 hours.  Micro Results: Recent Results (from the past 720 hours)  Resp panel by RT-PCR (RSV, Flu A&B, Covid) Anterior Nasal Swab     Status: None   Collection Time: 03/09/24  3:06 PM   Specimen: Anterior Nasal Swab  Result Value Ref Range Status   SARS Coronavirus 2 by RT PCR NEGATIVE NEGATIVE Final   Influenza A by PCR NEGATIVE NEGATIVE Final   Influenza B by PCR NEGATIVE NEGATIVE Final    Comment: (NOTE) The Xpert Xpress SARS-CoV-2/FLU/RSV plus assay is intended as an aid in the diagnosis of influenza from Nasopharyngeal swab specimens and should not be used as a sole basis for treatment. Nasal washings and aspirates are unacceptable for Xpert Xpress SARS-CoV-2/FLU/RSV testing.  Fact Sheet for Patients: BloggerCourse.com  Fact Sheet for Healthcare Providers: SeriousBroker.it  This test is not yet approved or cleared by the [place]  FDA and has been authorized for detection and/or diagnosis of SARS-CoV-2 by FDA under an Emergency Use Authorization (EUA). This EUA will remain in effect (meaning this test can be used) for the duration of the COVID-19 declaration under Section 564(b)(1) of the Act, 21 U.S.C. section 360bbb-3(b)(1), unless the authorization is terminated or revoked.     Resp Syncytial Virus by PCR NEGATIVE NEGATIVE Final    Comment: (NOTE) Fact Sheet for Patients: BloggerCourse.com  Fact Sheet for Healthcare Providers: SeriousBroker.it  This test is not yet approved or cleared by the [place]  FDA and has been authorized for detection and/or diagnosis of SARS-CoV-2 by FDA under an Emergency Use Authorization (EUA). This EUA will remain in effect (meaning this test can be used) for the duration of the COVID-19 declaration under Section  564(b)(1) of the Act, 21 U.S.C. section 360bbb-3(b)(1), unless the authorization is terminated or revoked.  Performed at [facility], [address] [place], [place] [address]   Culture, blood (Routine x 2)     Status: Abnormal   Collection Time: 03/09/24  3:39 PM   Specimen: BLOOD RIGHT HAND  Result Value Ref Range Status  Specimen Description BLOOD RIGHT HAND  Final   Special Requests   Final    BOTTLES DRAWN AEROBIC AND ANAEROBIC Blood Culture results may not be optimal due to an inadequate volume of blood received in culture bottles   Culture  Setup Time   Final    GRAM POSITIVE COCCI AEROBIC BOTTLE ONLY GREG ABBOTT PHARM D 03/11/2024 @ 0219 BY DD Performed at [facility], [address] [place], [place] [address]    Culture STAPHYLOCOCCUS AUREUS (A)  Final   Report Status 03/13/2024 FINAL  Final   Organism ID, Bacteria STAPHYLOCOCCUS AUREUS  Final      Susceptibility   Staphylococcus aureus - MIC*    CIPROFLOXACIN  <=0.5 SENSITIVE Sensitive     ERYTHROMYCIN <=0.25 SENSITIVE Sensitive     GENTAMICIN <=0.5 SENSITIVE Sensitive     OXACILLIN <=0.25 SENSITIVE Sensitive     TETRACYCLINE <=1 SENSITIVE Sensitive     VANCOMYCIN  1 SENSITIVE Sensitive     TRIMETH /SULFA  <=10 SENSITIVE Sensitive     CLINDAMYCIN <=0.25 SENSITIVE Sensitive     RIFAMPIN <=0.5 SENSITIVE Sensitive     Inducible Clindamycin NEGATIVE Sensitive     LINEZOLID 2 SENSITIVE Sensitive     * STAPHYLOCOCCUS AUREUS  Blood Culture ID Panel (Reflexed)     Status: Abnormal   Collection Time: 03/09/24  3:39 PM  Result Value Ref Range Status   Enterococcus faecalis NOT DETECTED NOT DETECTED Final   Enterococcus Faecium NOT DETECTED NOT DETECTED Final   Listeria monocytogenes NOT DETECTED NOT DETECTED Final   Staphylococcus species DETECTED (A) NOT DETECTED Final    Comment: CRITICAL RESULT CALLED TO, READ BACK BY AND VERIFIED WITH: GREG ABBOTT PHARM D 03/11/2024 @ 0219 BY DD    Staphylococcus aureus  (BCID) DETECTED (A) NOT DETECTED Final    Comment: CRITICAL RESULT CALLED TO, READ BACK BY AND VERIFIED WITH: GREG ABBOTT PHARM D 03/11/2024 @ 0219 BY DD    Staphylococcus epidermidis NOT DETECTED NOT DETECTED Final   Staphylococcus lugdunensis NOT DETECTED NOT DETECTED Final   Streptococcus species NOT DETECTED NOT DETECTED Final   Streptococcus agalactiae NOT DETECTED NOT DETECTED Final   Streptococcus pneumoniae NOT DETECTED NOT DETECTED Final   Streptococcus pyogenes NOT DETECTED NOT DETECTED Final   A.calcoaceticus-baumannii NOT DETECTED NOT DETECTED Final   Bacteroides fragilis NOT DETECTED NOT DETECTED Final   Enterobacterales NOT DETECTED NOT DETECTED Final   Enterobacter cloacae complex NOT DETECTED NOT DETECTED Final   Escherichia coli NOT DETECTED NOT DETECTED Final   Klebsiella aerogenes NOT DETECTED NOT DETECTED Final   Klebsiella oxytoca NOT DETECTED NOT DETECTED Final   Klebsiella pneumoniae NOT DETECTED NOT DETECTED Final   Proteus species NOT DETECTED NOT DETECTED Final   Salmonella species NOT DETECTED NOT DETECTED Final   Serratia marcescens NOT DETECTED NOT DETECTED Final   Haemophilus influenzae NOT DETECTED NOT DETECTED Final   Neisseria meningitidis NOT DETECTED NOT DETECTED Final   Pseudomonas aeruginosa NOT DETECTED NOT DETECTED Final   Stenotrophomonas maltophilia NOT DETECTED NOT DETECTED Final   Candida albicans NOT DETECTED NOT DETECTED Final   Candida auris NOT DETECTED NOT DETECTED Final   Candida glabrata NOT DETECTED NOT DETECTED Final   Candida krusei NOT DETECTED NOT DETECTED Final   Candida parapsilosis NOT DETECTED NOT DETECTED Final   Candida tropicalis NOT DETECTED NOT DETECTED Final   Cryptococcus neoformans/gattii NOT DETECTED NOT DETECTED Final   Meth resistant mecA/C and MREJ NOT DETECTED NOT DETECTED Final    Comment:  Performed at [facility], [address] [place], [place] [address]  Culture, blood (Routine x 2)     Status: None  (Preliminary result)   Collection Time: 03/09/24  7:27 PM   Specimen: BLOOD LEFT HAND  Result Value Ref Range Status   Specimen Description BLOOD LEFT HAND  Final   Special Requests   Final    BOTTLES DRAWN AEROBIC ONLY Blood Culture results may not be optimal due to an inadequate volume of blood received in culture bottles   Culture   Final    NO GROWTH 4 DAYS Performed at [facility], [address] [place], [place] [address]    Report Status PENDING  Incomplete  Culture, blood (Routine X 2) w Reflex to ID Panel     Status: None (Preliminary result)   Collection Time: 03/11/24  9:09 AM   Specimen: BLOOD LEFT HAND  Result Value Ref Range Status   Specimen Description BLOOD LEFT HAND  Final   Special Requests   Final    BOTTLES DRAWN AEROBIC AND ANAEROBIC Blood Culture results may not be optimal due to an inadequate volume of blood received in culture bottles   Culture   Final    NO GROWTH 2 DAYS Performed at [facility], [address], [place], [place] [address]    Report Status PENDING  Incomplete  Culture, blood (Routine X 2) w Reflex to ID Panel     Status: None (Preliminary result)   Collection Time: 03/11/24  9:16 AM   Specimen: BLOOD  Result Value Ref Range Status   Specimen Description BLOOD LEFT ANTECUBITAL  Final   Special Requests   Final    BOTTLES DRAWN AEROBIC AND ANAEROBIC Blood Culture results may not be optimal due to an inadequate volume of blood received in culture bottles   Culture   Final    NO GROWTH 2 DAYS Performed at [facility], [address], [place], [place] [address]    Report Status PENDING  Incomplete    Studies/Results: No results found.    Assessment/Plan:  INTERVAL HISTORY: pt narrowed to ancef    Principal Problem:   MSSA bacteremia Active Problems:   Uncontrolled type 2 diabetes mellitus with hyperglycemia (HCC)   Major depressive disorder, recurrent episode, moderate (HCC)   Sepsis (HCC)   Pressure  injury of skin    [name] is a 43 y.o. 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  Dr. Harden is going to see the patient today but I understand he believes that  the wounds can be managed by bed side debridement  We will need TEE and I will page cards master  Need blood cultures to clear  Monitor for metastatic sites of infection  Standard universal precautions  I have personally spent 54 minutes involved in face-to-face and non-face-to-face activities for this patient on the day of the visit. Professional time spent includes the following activities: Preparing to see the patient (review of tests), Obtaining and/or reviewing separately obtained history (admission/discharge record), Performing a medically appropriate examination and/or evaluation , Ordering medications/tests/procedures, referring and communicating with other health care professionals, Documenting clinical information in the EMR, Independently interpreting results (not separately reported), Communicating  results to the patient/family/caregiver, Counseling and educating the patient/family/caregiver and Care coordination (not separately reported).   Evaluation of the patient requires complex antimicrobial therapy evaluation, counseling , isolation needs to reduce disease transmission and risk assessment and mitigation.      LOS: 4 days   [name] 03/13/2024, 12:23 PM

## 2024-03-13 NOTE — Progress Notes (Signed)
   [facility] has been requested to perform a transesophageal echocardiogram on [name] for bacteremia.  After careful review of history and examination, the risks and benefits of transesophageal echocardiogram have been explained including risks of esophageal damage, perforation (1:10,000 risk), bleeding, pharyngeal hematoma as well as other potential complications associated with conscious sedation including aspiration, arrhythmia, respiratory failure and death. Alternatives to treatment were discussed, questions were answered. Patient is willing to proceed.   Plan for 10/21.  Thom LITTIE Sluder, PA-C  03/13/2024 1:37 PM

## 2024-03-13 NOTE — Consult Note (Addendum)
 ORTHOPAEDIC CONSULTATION  REQUESTING PHYSICIAN: Rosario Leatrice FERNS, MD  Chief Complaint: Redness and swelling left foot third toe with ulcer beneath the fourth metatarsal head.  HPI: [name] is a 43 y.o. male who presents with cellulitis and swelling of the third toe left foot.  Patient is status post left great toe amputation in 2021.  Patient has uncontrolled type 2 diabetes.  Patient states that he felt bad when he was admitted to the hospital and states he feels much better now.  Past Medical History:  Diagnosis Date   ADHD    Anxiety    Bipolar disorder (HCC)    Depression    Diabetes mellitus without complication (HCC)    type 2   Heart murmur    as a child   HLD (hyperlipidemia)    Hypertension    no meds   Peripheral vascular disease    Past Surgical History:  Procedure Laterality Date   AMPUTATION Left 03/06/2020   Procedure: LEFT GREAT TOE AMPUTATION;  Surgeon: Harden Jerona GAILS, MD;  Location: [facility] OR;  Service: Orthopedics;  Laterality: Left;   AMPUTATION Right 10/15/2021   Procedure: RIGHT GREAT TOE AMPUTATION;  Surgeon: Harden Jerona GAILS, MD;  Location: [facility] OR;  Service: Orthopedics;  Laterality: Right;   BONE BIOPSY Left 02/25/2023   Procedure: LEFT FIFTH METATARSAL BONE BIOPSY;  Surgeon: Lennie Barter, DPM;  Location: [facility] ORS;  Service: Podiatry;  Laterality: Left;   EYE SURGERY Bilateral    lasik   TRANSMETATARSAL AMPUTATION Right 02/25/2023   Procedure: RIGHT PARTIAL FIRST RAY AMPUTATION WITH ANTIBIOTIC BEAD APPLICATION;  Surgeon: Lennie Barter, DPM;  Location: [facility] ORS;  Service: Podiatry;  Laterality: Right;   WISDOM TOOTH EXTRACTION     Social History   Socioeconomic History   Marital status: Single    Spouse name: Not on file   Number of children: Not on file   Years of education: Not on file   Highest education level: Not on file  Occupational History   Occupation: Unemployed  Tobacco Use   Smoking status: Never   Smokeless tobacco:  Never  Vaping Use   Vaping status: Never Used  Substance and Sexual Activity   Alcohol use: Not Currently    Comment: last drink 2018   Drug use: Never   Sexual activity: Not Currently  Other Topics Concern   Not on file  Social History Narrative   Live with parents in [place].    Social Drivers of Health   Financial Resource Strain: Patient Declined (05/24/2023)   Received from [facility]   Overall Financial Resource Strain (CARDIA)    Difficulty of Paying Living Expenses: Patient declined  Recent Concern: Financial Resource Strain - High Risk (04/06/2023)   Overall Financial Resource Strain (CARDIA)    Difficulty of Paying Living Expenses: Very hard  Food Insecurity: No Food Insecurity (03/10/2024)   Hunger Vital Sign    Worried About Running Out of Food in the Last Year: Never true    Ran Out of Food in the Last Year: Never true  Transportation Needs: No Transportation Needs (03/10/2024)   PRAPARE - Administrator, Civil Service (Medical): No    Lack of Transportation (Non-Medical): No  Physical Activity: Inactive (04/06/2023)   Exercise Vital Sign    Days of Exercise per Week: 0 days    Minutes of Exercise per Session: 0 min  Stress: Stress Concern Present (04/06/2023)   [employer] of Occupational Health -  Occupational Stress Questionnaire    Feeling of Stress : Very much  Social Connections: Socially Isolated (04/06/2023)   Social Connection and Isolation Panel    Frequency of Communication with Friends and Family: More than three times a week    Frequency of Social Gatherings with Friends and Family: More than three times a week    Attends Religious Services: Never    Database administrator or Organizations: No    Attends Engineer, structural: Never    Marital Status: Never married   Family History  Problem Relation Age of Onset   Diabetes Mother    Hyperlipidemia Father    Hypertension Father    - negative  except otherwise stated in the family history section Allergies  Allergen Reactions   Milk-Related Compounds Diarrhea   Prior to Admission medications   Medication Sig Start Date End Date Taking? Authorizing Provider  hydroxypropyl methylcellulose / hypromellose (ISOPTO TEARS / GONIOVISC) 2.5 % ophthalmic solution Place 2 drops into both eyes daily as needed for dry eyes.   Yes [provider]  ibuprofen (ADVIL) 200 MG tablet Take 400 mg by mouth every 6 (six) hours as needed for headache or mild pain (pain score 1-3).   Yes [provider]   No results found. - pertinent xrays, CT, MRI studies were reviewed and independently interpreted  Positive ROS: All other systems have been reviewed and were otherwise negative with the exception of those mentioned in the HPI and as above.  Physical Exam: General: Alert, no acute distress Psychiatric: Patient is competent for consent with normal mood and affect Lymphatic: No axillary or cervical lymphadenopathy Cardiovascular: No pedal edema Respiratory: No cyanosis, no use of accessory musculature GI: No organomegaly, abdomen is soft and non-tender    Images:  @ENCIMAGES @  Labs:  Lab Results  Component Value Date   HGBA1C 12.9 (H) 03/10/2024   HGBA1C 9.6 (H) 02/24/2023   HGBA1C 11.9 (A) 12/31/2022   ESRSEDRATE 24 (H) 12/31/2022   ESRSEDRATE 48 (H) 03/21/2020   ESRSEDRATE 81 (H) 03/05/2020   CRP 12 (H) 12/31/2022   CRP 5.6 03/21/2020   CRP 9.0 (H) 03/05/2020   LABURIC 4.6 06/26/2021   LABURIC 5.1 02/18/2021   REPTSTATUS PENDING 03/11/2024   GRAMSTAIN NO WBC SEEN NO ORGANISMS SEEN  02/25/2023   CULT  03/11/2024    NO GROWTH 2 DAYS Performed at [facility], [address], [place], [place] [address]    [employer] STAPHYLOCOCCUS AUREUS 03/09/2024    Lab Results  Component Value Date   ALBUMIN 2.8 (L) 03/13/2024   ALBUMIN 2.9 (L) 03/11/2024   ALBUMIN 3.0 (L) 03/10/2024   LABURIC 4.6 06/26/2021    LABURIC 5.1 02/18/2021        Latest Ref Rng & Units 03/13/2024    5:36 AM 03/11/2024    6:08 AM 03/10/2024    1:10 AM  CBC EXTENDED  WBC 4.0 - 10.5 K/uL 7.1  7.6  10.0   RBC 4.22 - 5.81 MIL/uL 4.68  4.67  4.99   Hemoglobin 13.0 - 17.0 g/dL 86.7  86.7  85.8   HCT 39.0 - 52.0 % 39.7  39.3  43.1   Platelets 150 - 400 K/uL 209  146  156   NEUT# 1.7 - 7.7 K/uL 3.4  4.9    Lymph# 0.7 - 4.0 K/uL 2.8  1.7      Neurologic: Patient does not have protective sensation bilateral lower extremities.   MUSCULOSKELETAL:  Skin: Examination patient has sausage digit swelling of the third toe left foot.  There is no swelling of the 2nd, 4th or 5th toes.  There is a callused ulcer over the tip of the third toe.  There is no cellulitis proximal to the PIP joint.  Patient has a strong palpable dorsalis pedis pulse.  Review the MRI scan does show edema in the tuft of the third toe consistent with osteomyelitis.  There is no abscess.  There is no signs of infection throughout the remainder of the left foot.  Patient has a Wagner grade 1 ulcer beneath the fourth metatarsal head that is 1 cm diameter with granulation tissue at the base.  Review of the medical values show a white cell count of 7.1 with a hemoglobin A1c of 12.9.  Assessment: Assessment: Cellulitis and osteomyelitis third toe tuft.  Uncontrolled type 2 diabetes with inline arterial flow to the ankle.  Plan: Plan: Discussed with the patient that he can be followed as an outpatient for the infection of the tuft of the third toe.  Discussed that he can follow-up with me or the wound center in [place].  Patient states the [place] is closer and that he will most likely follow-up in [place].  I will follow-up as needed.  Recommended that patient will require an amputation of the third toe.  Thank you for the consult and the opportunity to see Mr. [name] Febus, MD [facility] [phone] 5:33 PM

## 2024-03-13 NOTE — Inpatient Diabetes Management (Signed)
 Inpatient Diabetes Program Recommendations  AACE/ADA: New Consensus Statement on Inpatient Glycemic Control (2015)  Target Ranges:  Prepandial:   less than 140 mg/dL      Peak postprandial:   less than 180 mg/dL (1-2 hours)      Critically ill patients:  140 - 180 mg/dL   Lab Results  Component Value Date   GLUCAP 243 (H) 03/13/2024   HGBA1C 12.9 (H) 03/10/2024    Review of Glycemic Control  Latest Reference Range & Units 03/12/24 06:33 03/12/24 12:18 03/12/24 16:06 03/12/24 21:14 03/13/24 06:21  Glucose-Capillary 70 - 99 mg/dL 775 (H) 830 (H) 756 (H) 272 (H) 243 (H)  (H): Data is abnormally high  Diabetes history: DM 2 Outpatient Diabetes medications: Basaglar  60 units qhs, Novolog  5 units tid, Metformin  2000 mg Daily Current orders for Inpatient glycemic control:  Semglee  30 units qhs Novolog  0-15 units tid  Novolog  4 units tid meal coverage  Inpatient Diabetes Program Recommendations:    Semglee  40 units at bedtime Novolog  6 units TID with meals   Thank you, Wyvonna Pinal, MSN, CDCES Diabetes Coordinator Inpatient Diabetes Program [phone] (team pager from 8a-5p)

## 2024-03-13 NOTE — Progress Notes (Signed)
 PROGRESS NOTE    [name]  FMW:[id number] DOB: 1980-10-06 DOA: 03/09/2024 PCP: Vicci Barnie NOVAK, MD  Outpatient Specialists:     Brief Narrative:  Patient is a 43 year old male with past medical history significant for diabetes mellitus type 2 and depression.  Patient did not take his medications for the last few months, possibly, related to depressive illness.  Patient could not also afford some of the medications.  Patient presented with fever, chills and left foot ulcer that has been unattended to for about a year.  MRI reported likely osteomyelitis.  Blood cultures growing MSSA.  TTE is nonrevealing.  Cardiology team has been consulted for TEE.  Orthopedic team and wound care have been consulted.  Patient is currently on IV cefazolin .  03/13/2024: Patient seen.  No new complaints.  Wound care input is appreciated.  Infectious disease team input is appreciated.  Awaiting orthopedic surgery input.  Cardiology team consulted for TEE.    Assessment & Plan:   Principal Problem:   MSSA bacteremia Active Problems:   Uncontrolled type 2 diabetes mellitus with hyperglycemia (HCC)   Major depressive disorder, recurrent episode, moderate (HCC)   Sepsis (HCC)   Pressure injury of skin   Sepsis/MSSA bacteremia:  -Likely secondary to infected left foot ulcer. - Patient has had ulcer for greater than 1 year. - MRI of the foot revealed chronic osteomyelitis.  -Blood culture culture grew MSSA.   - Follow wound culture. - Patient is currently on IV cefazolin  2 g every 8 hours.   - TTE is nonrevealing. - Cardiology team consulted for TEE  Uncontrolled diabetes mellitus type 2: -A1c of 12.9%. - Patient has been noncompliant. - Input from diabetic educator is highly appreciated.   -Continue subcutaneous Semglee  30 units every night, subcutaneous NovoLog  4 units with meals 3 times daily, and sliding scale insulin  coverage. - Continue to monitor blood sugar and adjust insulin   accordingly.     Acute renal failure: - Likely prerenal. - Resolved with hydration.  Syncope: - Likely from dehydration.    -Echo was non-revealing.    Depression: - Psychiatric input is appreciated.   -- Resume sertraline .  Cholelithiasis:  DVT prophylaxis: Lovenox  Code Status: Full code Family Communication: Mother Disposition Plan: Patient remains inpatient for now.   Consultants:  Psychiatry Have low threshold to consult infectious disease and orthopedic surgery.  Procedures:  None.  Antimicrobials:  IV vancomycin  discontinued. IV cefepime  discontinued. IV cefazolin .   Subjective: No new complaints  Objective: Vitals:   03/12/24 1946 03/12/24 2302 03/13/24 0440 03/13/24 0745  BP: (!) 140/84 111/80 (!) 134/90 128/89  Pulse: 77 86 75 74  Resp: 18 18 18 17   Temp: 98.6 F (37 C) 98.2 F (36.8 C) 97.8 F (36.6 C) 98.3 F (36.8 C)  TempSrc:    Oral  SpO2: 98% 98% 99% 100%  Weight:      Height:       No intake or output data in the 24 hours ending 03/13/24 1125  Filed Weights   03/09/24 1501  Weight: (!) 149.7 kg    Examination:  General exam: Appears calm and comfortable.  Patient is morbidly obese. Respiratory system: Clear to auscultation. Respiratory effort normal. Cardiovascular system: S1 & S2 heard Gastrointestinal system: Abdomen is obese, soft and nontender.   Central nervous system: Awake and alert.   Extremities: Left foot ulcer.  Cannot rule out diabetic/vascular ulcers, infected          Data Reviewed: I have personally reviewed  following labs and imaging studies  CBC: Recent Labs  Lab 03/09/24 1505 03/09/24 1758 03/09/24 1759 03/10/24 0110 03/11/24 0608 03/13/24 0536  WBC 12.1*  --   --  10.0 7.6 7.1  NEUTROABS 10.5*  --   --   --  4.9 3.4  HGB 16.1 14.3 15.0 14.1 13.2 13.2  HCT 47.5 42.0 44.0 43.1 39.3 39.7  MCV 83.9  --   --  86.4 84.2 84.8  PLT 188  --   --  156 146* 209   Basic Metabolic Panel: Recent Labs   Lab 03/09/24 1505 03/09/24 1758 03/09/24 1759 03/10/24 0110 03/11/24 0608 03/13/24 0536  NA 134* 138 139 136 136 136  K 3.9 3.8 3.8 3.7 3.6 3.8  CL 98  --  102 105 103 102  CO2 20*  --   --  20* 20* 26  GLUCOSE 446*  --  427* 340* 231* 238*  BUN 13  --  14 12 8 8   CREATININE 1.25*  --  1.00 0.94 0.76 0.68  CALCIUM  8.7*  --   --  8.3* 8.2* 8.6*  MG  --   --   --   --  1.9 2.0  PHOS  --   --   --   --  2.2* 3.0   GFR: Estimated Creatinine Clearance: 184.3 mL/min (by C-G formula based on SCr of 0.68 mg/dL). Liver Function Tests: Recent Labs  Lab 03/09/24 1505 03/10/24 0110 03/11/24 0608 03/13/24 0536  AST 29 20  --   --   ALT 22 19  --   --   ALKPHOS 106 81  --   --   BILITOT 0.8 1.1  --   --   PROT 7.1 6.1*  --   --   ALBUMIN 3.7 3.0* 2.9* 2.8*   Recent Labs  Lab 03/09/24 1505  LIPASE 20   No results for input(s): AMMONIA in the last 168 hours. Coagulation Profile: Recent Labs  Lab 03/09/24 1505  INR 1.1   Cardiac Enzymes: No results for input(s): CKTOTAL, CKMB, CKMBINDEX, TROPONINI in the last 168 hours. BNP (last 3 results) No results for input(s): PROBNP in the last 8760 hours. HbA1C: No results for input(s): HGBA1C in the last 72 hours.  CBG: Recent Labs  Lab 03/12/24 0633 03/12/24 1218 03/12/24 1606 03/12/24 2114 03/13/24 0621  GLUCAP 224* 169* 243* 272* 243*   Lipid Profile: No results for input(s): CHOL, HDL, LDLCALC, TRIG, CHOLHDL, LDLDIRECT in the last 72 hours. Thyroid Function Tests: No results for input(s): TSH, T4TOTAL, FREET4, T3FREE, THYROIDAB in the last 72 hours.  Anemia Panel: Recent Labs    03/11/24 0608  VITAMINB12 366   Urine analysis:    Component Value Date/Time   COLORURINE STRAW (A) 03/09/2024 1808   APPEARANCEUR CLEAR 03/09/2024 1808   LABSPEC 1.021 03/09/2024 1808   PHURINE 6.0 03/09/2024 1808   GLUCOSEU >=500 (A) 03/09/2024 1808   HGBUR NEGATIVE 03/09/2024 1808    BILIRUBINUR NEGATIVE 03/09/2024 1808   BILIRUBINUR negative 08/10/2016 1116   KETONESUR 20 (A) 03/09/2024 1808   PROTEINUR NEGATIVE 03/09/2024 1808   UROBILINOGEN 0.2 08/10/2016 1116   NITRITE NEGATIVE 03/09/2024 1808   LEUKOCYTESUR NEGATIVE 03/09/2024 1808   Sepsis Labs: @LABRCNTIP (procalcitonin:4,lacticidven:4)  ) Recent Results (from the past 240 hours)  Resp panel by RT-PCR (RSV, Flu A&B, Covid) Anterior Nasal Swab     Status: None   Collection Time: 03/09/24  3:06 PM   Specimen: Anterior Nasal Swab  Result Value  Ref Range Status   SARS Coronavirus 2 by RT PCR NEGATIVE NEGATIVE Final   Influenza A by PCR NEGATIVE NEGATIVE Final   Influenza B by PCR NEGATIVE NEGATIVE Final    Comment: (NOTE) The Xpert Xpress SARS-CoV-2/FLU/RSV plus assay is intended as an aid in the diagnosis of influenza from Nasopharyngeal swab specimens and should not be used as a sole basis for treatment. Nasal washings and aspirates are unacceptable for Xpert Xpress SARS-CoV-2/FLU/RSV testing.  Fact Sheet for Patients: BloggerCourse.com  Fact Sheet for Healthcare Providers: SeriousBroker.it  This test is not yet approved or cleared by the [place]  FDA and has been authorized for detection and/or diagnosis of SARS-CoV-2 by FDA under an Emergency Use Authorization (EUA). This EUA will remain in effect (meaning this test can be used) for the duration of the COVID-19 declaration under Section 564(b)(1) of the Act, 21 U.S.C. section 360bbb-3(b)(1), unless the authorization is terminated or revoked.     Resp Syncytial Virus by PCR NEGATIVE NEGATIVE Final    Comment: (NOTE) Fact Sheet for Patients: BloggerCourse.com  Fact Sheet for Healthcare Providers: SeriousBroker.it  This test is not yet approved or cleared by the [place]  FDA and has been authorized for detection and/or diagnosis of  SARS-CoV-2 by FDA under an Emergency Use Authorization (EUA). This EUA will remain in effect (meaning this test can be used) for the duration of the COVID-19 declaration under Section 564(b)(1) of the Act, 21 U.S.C. section 360bbb-3(b)(1), unless the authorization is terminated or revoked.  Performed at [facility], [address] [place], [place] [address]   Culture, blood (Routine x 2)     Status: Abnormal   Collection Time: 03/09/24  3:39 PM   Specimen: BLOOD RIGHT HAND  Result Value Ref Range Status   Specimen Description BLOOD RIGHT HAND  Final   Special Requests   Final    BOTTLES DRAWN AEROBIC AND ANAEROBIC Blood Culture results may not be optimal due to an inadequate volume of blood received in culture bottles   Culture  Setup Time   Final    GRAM POSITIVE COCCI AEROBIC BOTTLE ONLY GREG ABBOTT PHARM D 03/11/2024 @ 0219 BY DD Performed at [facility], [address], [place], [place] [address]    Culture STAPHYLOCOCCUS AUREUS (A)  Final   Report Status 03/13/2024 FINAL  Final   Organism ID, Bacteria STAPHYLOCOCCUS AUREUS  Final      Susceptibility   Staphylococcus aureus - MIC*    CIPROFLOXACIN  <=0.5 SENSITIVE Sensitive     ERYTHROMYCIN <=0.25 SENSITIVE Sensitive     GENTAMICIN <=0.5 SENSITIVE Sensitive     OXACILLIN <=0.25 SENSITIVE Sensitive     TETRACYCLINE <=1 SENSITIVE Sensitive     VANCOMYCIN  1 SENSITIVE Sensitive     TRIMETH /SULFA  <=10 SENSITIVE Sensitive     CLINDAMYCIN <=0.25 SENSITIVE Sensitive     RIFAMPIN <=0.5 SENSITIVE Sensitive     Inducible Clindamycin NEGATIVE Sensitive     LINEZOLID 2 SENSITIVE Sensitive     * STAPHYLOCOCCUS AUREUS  Blood Culture ID Panel (Reflexed)     Status: Abnormal   Collection Time: 03/09/24  3:39 PM  Result Value Ref Range Status   Enterococcus faecalis NOT DETECTED NOT DETECTED Final   Enterococcus Faecium NOT DETECTED NOT DETECTED Final   Listeria monocytogenes NOT DETECTED NOT DETECTED Final    Staphylococcus species DETECTED (A) NOT DETECTED Final    Comment: CRITICAL RESULT CALLED TO, READ BACK BY AND VERIFIED WITH: GREG ABBOTT PHARM D  03/11/2024 @ 0219 BY DD    Staphylococcus aureus (BCID) DETECTED (A) NOT DETECTED Final    Comment: CRITICAL RESULT CALLED TO, READ BACK BY AND VERIFIED WITH: GREG ABBOTT PHARM D 03/11/2024 @ 0219 BY DD    Staphylococcus epidermidis NOT DETECTED NOT DETECTED Final   Staphylococcus lugdunensis NOT DETECTED NOT DETECTED Final   Streptococcus species NOT DETECTED NOT DETECTED Final   Streptococcus agalactiae NOT DETECTED NOT DETECTED Final   Streptococcus pneumoniae NOT DETECTED NOT DETECTED Final   Streptococcus pyogenes NOT DETECTED NOT DETECTED Final   A.calcoaceticus-baumannii NOT DETECTED NOT DETECTED Final   Bacteroides fragilis NOT DETECTED NOT DETECTED Final   Enterobacterales NOT DETECTED NOT DETECTED Final   Enterobacter cloacae complex NOT DETECTED NOT DETECTED Final   Escherichia coli NOT DETECTED NOT DETECTED Final   Klebsiella aerogenes NOT DETECTED NOT DETECTED Final   Klebsiella oxytoca NOT DETECTED NOT DETECTED Final   Klebsiella pneumoniae NOT DETECTED NOT DETECTED Final   Proteus species NOT DETECTED NOT DETECTED Final   Salmonella species NOT DETECTED NOT DETECTED Final   Serratia marcescens NOT DETECTED NOT DETECTED Final   Haemophilus influenzae NOT DETECTED NOT DETECTED Final   Neisseria meningitidis NOT DETECTED NOT DETECTED Final   Pseudomonas aeruginosa NOT DETECTED NOT DETECTED Final   Stenotrophomonas maltophilia NOT DETECTED NOT DETECTED Final   Candida albicans NOT DETECTED NOT DETECTED Final   Candida auris NOT DETECTED NOT DETECTED Final   Candida glabrata NOT DETECTED NOT DETECTED Final   Candida krusei NOT DETECTED NOT DETECTED Final   Candida parapsilosis NOT DETECTED NOT DETECTED Final   Candida tropicalis NOT DETECTED NOT DETECTED Final   Cryptococcus neoformans/gattii NOT DETECTED NOT DETECTED Final    Meth resistant mecA/C and MREJ NOT DETECTED NOT DETECTED Final    Comment: Performed at [facility], [address] [place], [place] [address]  Culture, blood (Routine x 2)     Status: None (Preliminary result)   Collection Time: 03/09/24  7:27 PM   Specimen: BLOOD LEFT HAND  Result Value Ref Range Status   Specimen Description BLOOD LEFT HAND  Final   Special Requests   Final    BOTTLES DRAWN AEROBIC ONLY Blood Culture results may not be optimal due to an inadequate volume of blood received in culture bottles   Culture   Final    NO GROWTH 4 DAYS Performed at [name], [address] [place], [place] [address]    Report Status PENDING  Incomplete  Culture, blood (Routine X 2) w Reflex to ID Panel     Status: None (Preliminary result)   Collection Time: 03/11/24  9:09 AM   Specimen: BLOOD LEFT HAND  Result Value Ref Range Status   Specimen Description BLOOD LEFT HAND  Final   Special Requests   Final    BOTTLES DRAWN AEROBIC AND ANAEROBIC Blood Culture results may not be optimal due to an inadequate volume of blood received in culture bottles   Culture   Final    NO GROWTH 2 DAYS Performed at [facility], [address] [place], [place] [address]    Report Status PENDING  Incomplete  Culture, blood (Routine X 2) w Reflex to ID Panel     Status: None (Preliminary result)   Collection Time: 03/11/24  9:16 AM   Specimen: BLOOD  Result Value Ref Range Status   Specimen Description BLOOD LEFT ANTECUBITAL  Final   Special Requests   Final  BOTTLES DRAWN AEROBIC AND ANAEROBIC Blood Culture results may not be optimal due to an inadequate volume of blood received in culture bottles   Culture   Final    NO GROWTH 2 DAYS Performed at [facility], [address], [place], [place] [address]    Report Status PENDING  Incomplete         Radiology Studies: No results found.       Scheduled Meds:  heparin   5,000 Units Subcutaneous Q8H   insulin   aspart  0-15 Units Subcutaneous TID WC   insulin  aspart  4 Units Subcutaneous TID WC   insulin  glargine-yfgn  30 Units Subcutaneous QHS   sertraline   25 mg Oral Daily   Continuous Infusions:   ceFAZolin  (ANCEF ) IV 2 g (03/13/24 0637)     LOS: 4 days    Time spent: 35 minutes    Leatrice Chapel, MD  [facility] Pager #: [phone] 7PM-7AM contact night coverage as above

## 2024-03-14 ENCOUNTER — Inpatient Hospital Stay (HOSPITAL_COMMUNITY): Payer: MEDICAID | Admitting: Anesthesiology

## 2024-03-14 ENCOUNTER — Inpatient Hospital Stay (HOSPITAL_COMMUNITY): Payer: MEDICAID

## 2024-03-14 ENCOUNTER — Encounter (HOSPITAL_COMMUNITY)
Admission: EM | Disposition: A | Discharge status: Home / Self Care | Payer: Self-pay | Source: Home / Self Care | Attending: Internal Medicine

## 2024-03-14 DIAGNOSIS — R7881 Bacteremia: Secondary | ICD-10-CM

## 2024-03-14 DIAGNOSIS — M86172 Other acute osteomyelitis, left ankle and foot: Secondary | ICD-10-CM

## 2024-03-14 DIAGNOSIS — F418 Other specified anxiety disorders: Secondary | ICD-10-CM

## 2024-03-14 DIAGNOSIS — E119 Type 2 diabetes mellitus without complications: Secondary | ICD-10-CM | POA: Diagnosis not present

## 2024-03-14 DIAGNOSIS — I10 Essential (primary) hypertension: Secondary | ICD-10-CM

## 2024-03-14 HISTORY — PX: TRANSESOPHAGEAL ECHOCARDIOGRAM (CATH LAB): EP1270

## 2024-03-14 LAB — CULTURE, BLOOD (ROUTINE X 2): Culture: NO GROWTH

## 2024-03-14 LAB — VAS US ABI WITH/WO TBI
Left ABI: 1.29
Right ABI: 1.14

## 2024-03-14 LAB — GLUCOSE, CAPILLARY
Glucose-Capillary: 233 mg/dL — ABNORMAL HIGH (ref 70–99)
Glucose-Capillary: 274 mg/dL — ABNORMAL HIGH (ref 70–99)
Glucose-Capillary: 265 mg/dL — ABNORMAL HIGH (ref 70–99)

## 2024-03-14 LAB — C-REACTIVE PROTEIN: CRP: 1.2 mg/dL — ABNORMAL HIGH (ref <1.0)

## 2024-03-14 LAB — ECHO TEE

## 2024-03-14 LAB — SEDIMENTATION RATE: Sed Rate: 24 mm/h — ABNORMAL HIGH (ref 0–16)

## 2024-03-14 SURGERY — TRANSESOPHAGEAL ECHOCARDIOGRAM (TEE) (CATHLAB)
Anesthesia: Monitor Anesthesia Care

## 2024-03-14 MED ORDER — FENTANYL CITRATE (PF) 100 MCG/2ML IJ SOLN: 25.0000 ug | INTRAMUSCULAR | Status: DC | PRN

## 2024-03-14 MED ORDER — PROPOFOL 500 MG/50ML IV EMUL
INTRAVENOUS | Status: DC | PRN
  Administered 2024-03-14: 130 ug/kg/min via INTRAVENOUS

## 2024-03-14 MED ORDER — LIDOCAINE 2% (20 MG/ML) 5 ML SYRINGE
INTRAMUSCULAR | Status: DC | PRN
  Administered 2024-03-14: 100 mg via INTRAVENOUS

## 2024-03-14 MED ORDER — OXYCODONE HCL 5 MG PO TABS: 5.0000 mg | ORAL_TABLET | Freq: Once | ORAL | Status: DC | PRN

## 2024-03-14 MED ORDER — PROPOFOL 10 MG/ML IV BOLUS
INTRAVENOUS | Status: DC | PRN
  Administered 2024-03-14: 60 mg via INTRAVENOUS

## 2024-03-14 MED ORDER — ACETAMINOPHEN 10 MG/ML IV SOLN: 1000.0000 mg | Freq: Once | INTRAVENOUS | Status: DC | PRN

## 2024-03-14 MED ORDER — ONDANSETRON HCL 4 MG/2ML IJ SOLN: 4.0000 mg | Freq: Once | INTRAMUSCULAR | Status: DC | PRN

## 2024-03-14 MED ORDER — SODIUM CHLORIDE 0.9 % IV SOLN: INTRAVENOUS | Status: DC

## 2024-03-14 MED ORDER — OXYCODONE HCL 5 MG/5ML PO SOLN: 5.0000 mg | Freq: Once | ORAL | Status: DC | PRN

## 2024-03-14 NOTE — Progress Notes (Signed)
 Subjective:  No new complaints   Antibiotics:  Anti-infectives (From admission, onward)    Start     Dose/Rate Route Frequency Ordered Stop   03/11/24 0600  ceFAZolin  (ANCEF ) IVPB 2g/100 mL premix        2 g 200 mL/hr over 30 Minutes Intravenous Every 8 hours 03/11/24 0238     03/10/24 0900  vancomycin  (VANCOREADY) IVPB 1750 mg/350 mL  Status:  Discontinued        1,750 mg 175 mL/hr over 120 Minutes Intravenous Every 12 hours 03/09/24 2257 03/11/24 0238   03/10/24 0500  metroNIDAZOLE  (FLAGYL ) IVPB 500 mg  Status:  Discontinued        500 mg 100 mL/hr over 60 Minutes Intravenous Every 12 hours 03/09/24 2246 03/11/24 0238   03/10/24 0000  ceFEPIme  (MAXIPIME ) 2 g in sodium chloride  0.9 % 100 mL IVPB  Status:  Discontinued        2 g 200 mL/hr over 30 Minutes Intravenous  Once 03/09/24 2246 03/09/24 2257   03/09/24 2300  vancomycin  (VANCOCIN ) IVPB 1000 mg/200 mL premix  Status:  Discontinued        1,000 mg 200 mL/hr over 60 Minutes Intravenous  Once 03/09/24 2246 03/09/24 2248   03/09/24 2256  ceFEPIme  (MAXIPIME ) 2 g in sodium chloride  0.9 % 100 mL IVPB  Status:  Discontinued        2 g 200 mL/hr over 30 Minutes Intravenous Every 8 hours 03/09/24 2257 03/11/24 0238   03/09/24 1515  ceFEPIme  (MAXIPIME ) 2 g in sodium chloride  0.9 % 100 mL IVPB        2 g 200 mL/hr over 30 Minutes Intravenous  Once 03/09/24 1506 03/09/24 1637   03/09/24 1515  metroNIDAZOLE  (FLAGYL ) IVPB 500 mg        500 mg 100 mL/hr over 60 Minutes Intravenous  Once 03/09/24 1506 03/09/24 1800   03/09/24 1515  vancomycin  (VANCOCIN ) IVPB 1000 mg/200 mL premix  Status:  Discontinued        1,000 mg 200 mL/hr over 60 Minutes Intravenous  Once 03/09/24 1506 03/09/24 1511   03/09/24 1515  vancomycin  (VANCOCIN ) 2,500 mg in sodium chloride  0.9 % 500 mL IVPB        2,500 mg 262.5 mL/hr over 120 Minutes Intravenous  Once 03/09/24 1511 03/09/24 2304       Medications: Scheduled Meds:  heparin   5,000 Units  Subcutaneous Q8H   insulin  aspart  0-15 Units Subcutaneous TID WC   insulin  aspart  4 Units Subcutaneous TID WC   insulin  glargine-yfgn  30 Units Subcutaneous QHS   sertraline   25 mg Oral Daily   Continuous Infusions:   ceFAZolin  (ANCEF ) IV 2 g (03/14/24 1346)   PRN Meds:.    Objective: Weight change:  No intake or output data in the 24 hours ending 03/14/24 1630 Blood pressure (!) 140/82, pulse 81, temperature 98.9 F (37.2 C), temperature source Oral, resp. rate 20, height 6' 3 (1.905 m), weight (!) 149.7 kg, SpO2 98%. Temp:  [97 F (36.1 C)-98.9 F (37.2 C)] 98.9 F (37.2 C) (10/21 1629) Pulse Rate:  [78-86] 81 (10/21 1629) Resp:  [6-20] 20 (10/21 1629) BP: (105-141)/(68-98) 140/82 (10/21 1629) SpO2:  [96 %-100 %] 98 % (10/21 1629)  Physical Exam: Physical Exam Constitutional:      Appearance: Normal appearance.  Eyes:     General:        Right eye: No discharge.  Left eye: No discharge.     Extraocular Movements: Extraocular movements intact.     Pupils: Pupils are equal, round, and reactive to light.  Pulmonary:     Effort: No respiratory distress.     Breath sounds: No wheezing.  Abdominal:     General: There is no distension.  Musculoskeletal:     Cervical back: Normal range of motion and neck supple.  Skin:    General: Skin is warm and dry.  Neurological:     General: No focal deficit present.     Mental Status: He is alert.  Psychiatric:        Mood and Affect: Mood normal.        Behavior: Behavior normal.        Thought Content: Thought content normal.        Judgment: Judgment normal.      Left lower leg 03/13/2024:           CBC:    BMET Recent Labs    03/13/24 0536  NA 136  K 3.8  CL 102  CO2 26  GLUCOSE 238*  BUN 8  CREATININE 0.68  CALCIUM  8.6*     Liver Panel  Recent Labs    03/13/24 0536  ALBUMIN 2.8*       Sedimentation Rate Recent Labs    03/14/24 0201  ESRSEDRATE 24*   C-Reactive  Protein Recent Labs    03/14/24 0201  CRP 1.2*    Micro Results: Recent Results (from the past 720 hours)  Resp panel by RT-PCR (RSV, Flu A&B, Covid) Anterior Nasal Swab     Status: None   Collection Time: 03/09/24  3:06 PM   Specimen: Anterior Nasal Swab  Result Value Ref Range Status   SARS Coronavirus 2 by RT PCR NEGATIVE NEGATIVE Final   Influenza A by PCR NEGATIVE NEGATIVE Final   Influenza B by PCR NEGATIVE NEGATIVE Final    Comment: (NOTE) The Xpert Xpress SARS-CoV-2/FLU/RSV plus assay is intended as an aid in the diagnosis of influenza from Nasopharyngeal swab specimens and should not be used as a sole basis for treatment. Nasal washings and aspirates are unacceptable for Xpert Xpress SARS-CoV-2/FLU/RSV testing.  Fact Sheet for Patients: BloggerCourse.com  Fact Sheet for Healthcare Providers: SeriousBroker.it  This test is not yet approved or cleared by the [place]  FDA and has been authorized for detection and/or diagnosis of SARS-CoV-2 by FDA under an Emergency Use Authorization (EUA). This EUA will remain in effect (meaning this test can be used) for the duration of the COVID-19 declaration under Section 564(b)(1) of the Act, 21 U.S.C. section 360bbb-3(b)(1), unless the authorization is terminated or revoked.     Resp Syncytial Virus by PCR NEGATIVE NEGATIVE Final    Comment: (NOTE) Fact Sheet for Patients: BloggerCourse.com  Fact Sheet for Healthcare Providers: SeriousBroker.it  This test is not yet approved or cleared by the [place]  FDA and has been authorized for detection and/or diagnosis of SARS-CoV-2 by FDA under an Emergency Use Authorization (EUA). This EUA will remain in effect (meaning this test can be used) for the duration of the COVID-19 declaration under Section 564(b)(1) of the Act, 21 U.S.C. section 360bbb-3(b)(1), unless the  authorization is terminated or revoked.  Performed at [facility], [address], [place], [place] [address]   Culture, blood (Routine x 2)     Status: Abnormal   Collection Time: 03/09/24  3:39 PM   Specimen: BLOOD RIGHT HAND  Result  Value Ref Range Status   Specimen Description BLOOD RIGHT HAND  Final   Special Requests   Final    BOTTLES DRAWN AEROBIC AND ANAEROBIC Blood Culture results may not be optimal due to an inadequate volume of blood received in culture bottles   Culture  Setup Time   Final    GRAM POSITIVE COCCI AEROBIC BOTTLE ONLY GREG ABBOTT PHARM D 03/11/2024 @ 0219 BY DD Performed at [facility], [address] [place], [place] [address]    Culture STAPHYLOCOCCUS AUREUS (A)  Final   Report Status 03/13/2024 FINAL  Final   Organism ID, Bacteria STAPHYLOCOCCUS AUREUS  Final      Susceptibility   Staphylococcus aureus - MIC*    CIPROFLOXACIN  <=0.5 SENSITIVE Sensitive     ERYTHROMYCIN <=0.25 SENSITIVE Sensitive     GENTAMICIN <=0.5 SENSITIVE Sensitive     OXACILLIN <=0.25 SENSITIVE Sensitive     TETRACYCLINE <=1 SENSITIVE Sensitive     VANCOMYCIN  1 SENSITIVE Sensitive     TRIMETH /SULFA  <=10 SENSITIVE Sensitive     CLINDAMYCIN <=0.25 SENSITIVE Sensitive     RIFAMPIN <=0.5 SENSITIVE Sensitive     Inducible Clindamycin NEGATIVE Sensitive     LINEZOLID 2 SENSITIVE Sensitive     * STAPHYLOCOCCUS AUREUS  Blood Culture ID Panel (Reflexed)     Status: Abnormal   Collection Time: 03/09/24  3:39 PM  Result Value Ref Range Status   Enterococcus faecalis NOT DETECTED NOT DETECTED Final   Enterococcus Faecium NOT DETECTED NOT DETECTED Final   Listeria monocytogenes NOT DETECTED NOT DETECTED Final   Staphylococcus species DETECTED (A) NOT DETECTED Final    Comment: CRITICAL RESULT CALLED TO, READ BACK BY AND VERIFIED WITH: GREG ABBOTT PHARM D 03/11/2024 @ 0219 BY DD    Staphylococcus aureus (BCID) DETECTED (A) NOT DETECTED Final    Comment: CRITICAL RESULT  CALLED TO, READ BACK BY AND VERIFIED WITH: GREG ABBOTT PHARM D 03/11/2024 @ 0219 BY DD    Staphylococcus epidermidis NOT DETECTED NOT DETECTED Final   Staphylococcus lugdunensis NOT DETECTED NOT DETECTED Final   Streptococcus species NOT DETECTED NOT DETECTED Final   Streptococcus agalactiae NOT DETECTED NOT DETECTED Final   Streptococcus pneumoniae NOT DETECTED NOT DETECTED Final   Streptococcus pyogenes NOT DETECTED NOT DETECTED Final   A.calcoaceticus-baumannii NOT DETECTED NOT DETECTED Final   Bacteroides fragilis NOT DETECTED NOT DETECTED Final   Enterobacterales NOT DETECTED NOT DETECTED Final   Enterobacter cloacae complex NOT DETECTED NOT DETECTED Final   Escherichia coli NOT DETECTED NOT DETECTED Final   Klebsiella aerogenes NOT DETECTED NOT DETECTED Final   Klebsiella oxytoca NOT DETECTED NOT DETECTED Final   Klebsiella pneumoniae NOT DETECTED NOT DETECTED Final   Proteus species NOT DETECTED NOT DETECTED Final   Salmonella species NOT DETECTED NOT DETECTED Final   Serratia marcescens NOT DETECTED NOT DETECTED Final   Haemophilus influenzae NOT DETECTED NOT DETECTED Final   Neisseria meningitidis NOT DETECTED NOT DETECTED Final   Pseudomonas aeruginosa NOT DETECTED NOT DETECTED Final   Stenotrophomonas maltophilia NOT DETECTED NOT DETECTED Final   Candida albicans NOT DETECTED NOT DETECTED Final   Candida auris NOT DETECTED NOT DETECTED Final   Candida glabrata NOT DETECTED NOT DETECTED Final   Candida krusei NOT DETECTED NOT DETECTED Final   Candida parapsilosis NOT DETECTED NOT DETECTED Final   Candida tropicalis NOT DETECTED NOT DETECTED Final   Cryptococcus neoformans/gattii NOT DETECTED NOT DETECTED Final   Meth resistant mecA/C and MREJ NOT DETECTED NOT  DETECTED Final    Comment: Performed at [facility], [name], [place], [place] [address]  Culture, blood (Routine x 2)     Status: None   Collection Time: 03/09/24  7:27 PM   Specimen: BLOOD LEFT HAND   Result Value Ref Range Status   Specimen Description BLOOD LEFT HAND  Final   Special Requests   Final    BOTTLES DRAWN AEROBIC ONLY Blood Culture results may not be optimal due to an inadequate volume of blood received in culture bottles   Culture   Final    NO GROWTH 5 DAYS Performed at [facility], [address] [place], [place] [address]    Report Status 03/14/2024 FINAL  Final  Culture, blood (Routine X 2) w Reflex to ID Panel     Status: None (Preliminary result)   Collection Time: 03/11/24  9:09 AM   Specimen: BLOOD LEFT HAND  Result Value Ref Range Status   Specimen Description BLOOD LEFT HAND  Final   Special Requests   Final    BOTTLES DRAWN AEROBIC AND ANAEROBIC Blood Culture results may not be optimal due to an inadequate volume of blood received in culture bottles   Culture   Final    NO GROWTH 3 DAYS Performed at [facility], [address], [place], [place] [address]    Report Status PENDING  Incomplete  Culture, blood (Routine X 2) w Reflex to ID Panel     Status: None (Preliminary result)   Collection Time: 03/11/24  9:16 AM   Specimen: BLOOD  Result Value Ref Range Status   Specimen Description BLOOD LEFT ANTECUBITAL  Final   Special Requests   Final    BOTTLES DRAWN AEROBIC AND ANAEROBIC Blood Culture results may not be optimal due to an inadequate volume of blood received in culture bottles   Culture   Final    NO GROWTH 3 DAYS Performed at [facility], [address] [place], [place] [address]    Report Status PENDING  Incomplete    Studies/Results: ECHO TEE Result Date: 03/14/2024    TRANSESOPHOGEAL ECHO REPORT   Patient Name:   KRISTION HOLIFIELD Date of Exam: 03/14/2024 Medical Rec #:  [id number]           Height:       75.0 in Accession #:    [id number]          Weight:       330.0 lb Date of Birth:  05/11/1981           BSA:          2.715 m Patient Age:    42 years            BP:           133/87 mmHg Patient Gender: M                    HR:           87 bpm. Exam Location:  Inpatient Procedure: Color Doppler, Cardiac Doppler, Transesophageal Echo and Saline            Contrast Bubble Study (Both Spectral and Color Flow Doppler were            utilized during procedure). Indications:     Bacteremia  History:         Patient has prior history of Echocardiogram examinations, most  recent 03/10/2024.  Sonographer:     Tinnie Gosling RDCS Referring Phys:  [id number] THOM CROME HALEY Diagnosing Phys: Wilbert Bihari MD PROCEDURE: After discussion of the risks and benefits of a TEE, an informed consent was obtained from the patient. TEE procedure time was 13 minutes. The transesophogeal probe was passed without difficulty through the esophogus of the patient. Imaged were obtained with the patient in a left lateral decubitus position. Sedation performed by different physician. Image quality was excellent. The patient's vital signs; including heart rate, blood pressure, and oxygen  saturation; remained stable throughout the procedure. The patient developed no complications during the procedure.  IMPRESSIONS  1. Left ventricular ejection fraction, by estimation, is 60 to 65%. The left ventricle has normal function. The left ventricle has no regional wall motion abnormalities.  2. Right ventricular systolic function is normal. The right ventricular size is normal.  3. No left atrial/left atrial appendage thrombus was detected. The LAA emptying velocity was 80 cm/s.  4. The mitral valve is normal in structure. Trivial mitral valve regurgitation. No evidence of mitral stenosis.  5. The aortic valve is normal in structure. Aortic valve regurgitation is not visualized. No aortic stenosis is present.  6. The inferior vena cava is normal in size with greater than 50% respiratory variability, suggesting right atrial pressure of 3 mmHg. Conclusion(s)/Recommendation(s): Normal biventricular function without evidence of hemodynamically significant  valvular heart disease. No evidence of vegetation/infective endocarditis on this transesophageael echocardiogram. FINDINGS  Left Ventricle: Left ventricular ejection fraction, by estimation, is 60 to 65%. The left ventricle has normal function. The left ventricle has no regional wall motion abnormalities. The left ventricular internal cavity size was normal in size. There is  no left ventricular hypertrophy. Right Ventricle: The right ventricular size is normal. No increase in right ventricular wall thickness. Right ventricular systolic function is normal. Left Atrium: Left atrial size was normal in size. No left atrial/left atrial appendage thrombus was detected. The LAA emptying velocity was 80 cm/s. Right Atrium: Right atrial size was normal in size. Pericardium: There is no evidence of pericardial effusion. Mitral Valve: The mitral valve is normal in structure. Trivial mitral valve regurgitation. No evidence of mitral valve stenosis. Tricuspid Valve: The tricuspid valve is normal in structure. Tricuspid valve regurgitation is trivial. No evidence of tricuspid stenosis. Aortic Valve: The aortic valve is normal in structure. Aortic valve regurgitation is not visualized. No aortic stenosis is present. Pulmonic Valve: The pulmonic valve was normal in structure. Pulmonic valve regurgitation is not visualized. No evidence of pulmonic stenosis. Aorta: The aortic root is normal in size and structure. Venous: The inferior vena cava is normal in size with greater than 50% respiratory variability, suggesting right atrial pressure of 3 mmHg. IAS/Shunts: There is redundancy of the interatrial septum. No atrial level shunt detected by color flow Doppler. Agitated saline contrast was given intravenously to evaluate for intracardiac shunting. Wilbert Bihari MD Electronically signed by Wilbert Bihari MD Signature Date/Time: 03/14/2024/3:14:17 PM    Final    EP STUDY Result Date: 03/14/2024 See surgical note for result.  VAS US   ABI WITH/WO TBI Result Date: 03/14/2024  LOWER EXTREMITY DOPPLER STUDY Patient Name:  [name]  Date of Exam:   03/13/2024 Medical Rec #: [id number]            Accession #:    [id number] Date of Birth: 02-Jan-1981            Patient Gender: M Patient Age:   16 years Exam  Location:  [facility] Procedure:      VAS US  ABI WITH/WO TBI Referring Phys: SYLVESTER OGBATA --------------------------------------------------------------------------------  Indications: Peripheral artery disease. High Risk Factors: Hypertension, hyperlipidemia, Diabetes, no history of                    smoking.  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 Comparison Study: No prior exam. Performing Technologist: Edilia Elden Appl  Examination Guidelines: A complete evaluation includes at minimum, Doppler waveform signals and systolic blood pressure reading at the level of bilateral brachial, anterior tibial, and posterior tibial arteries, when vessel segments are accessible. Bilateral testing is considered an integral part of a complete examination. Photoelectric Plethysmograph (PPG) waveforms and toe systolic pressure readings are included as required and additional duplex testing as needed. Limited examinations for reoccurring indications may be performed as noted.  ABI Findings: +---------+------------------+-----+---------+----------+ Right    Rt Pressure (mmHg)IndexWaveform Comment    +---------+------------------+-----+---------+----------+ Brachial 141                    triphasic           +---------+------------------+-----+---------+----------+ PTA      161               1.14 triphasic           +---------+------------------+-----+---------+----------+ DP       152               1.08 triphasic            +---------+------------------+-----+---------+----------+ Great Toe177               1.26 Normal   2nd digit. +---------+------------------+-----+---------+----------+ +---------+------------------+-----+---------+----------+ Left     Lt Pressure (mmHg)IndexWaveform Comment    +---------+------------------+-----+---------+----------+ Brachial 132                    triphasic           +---------+------------------+-----+---------+----------+ PTA      182               1.29 triphasic           +---------+------------------+-----+---------+----------+ DP       173               1.23 triphasic           +---------+------------------+-----+---------+----------+ Great Toe180               1.28 Normal   2nd digit. +---------+------------------+-----+---------+----------+ +-------+-----------+-----------+------------+------------+ ABI/TBIToday's ABIToday's TBIPrevious ABIPrevious TBI +-------+-----------+-----------+------------+------------+ Right  1.14       1.26                                +-------+-----------+-----------+------------+------------+ Left   1.29       1.28                                +-------+-----------+-----------+------------+------------+  Summary: Right: Resting right ankle-brachial index is within normal range. The right toe-brachial index is normal.  Left: Resting left  ankle-brachial index is within normal range. The left toe-brachial index is normal.  *See table(s) above for measurements and observations.  Electronically signed by Debby Robertson on 03/14/2024 at 10:25:43 AM.    Final       Assessment/Plan:  INTERVAL HISTORY: TEE is clean   Principal Problem:   MSSA bacteremia Active Problems:   Uncontrolled type 2 diabetes mellitus with hyperglycemia (HCC)   Major depressive disorder, recurrent episode, moderate (HCC)   Left foot infection   Sepsis (HCC)   Pressure injury of skin    [name] is a 43 y.o.  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  MRI was done of the left side which showed serration below the fourth MTP joint truncated distal phalanx of the second toe with low T1 signal in the distal phalanx which could be consistent with chronic osteomyelitis also with some accentuated T2 in the distal phalanx of the third toe where there also could be osteomyelitis as well as some erosive arthropathy in the head of the fifth metatarsal and the proximal phalangeal base of the fifth toe seen more consistent with a non-ID arthritis according to radiology there is also prior amputation of the great toe at the MTP joint  #1 MSSA bacteremia with presumed source being left foot  TEE negative  I have discussed the case with Dr. Harden and I DO believe that with the patient having osteomyelitis in 3rd toe that this is very likely the source of his bacteremia and sepsis  I think he very much needs an amputation of this digit for  source control is achieved  Need blood cultures to clear  Monitor for metastatic sites of infection  There is concern that he cannot afford home health and IV abx at home  I would continue IV abx for now and await surgery.  After surgery we can investigate the home IV abx issue further and see what the issue truly is (cannot do charity care?) vs other option such as zyvox +/- long acting antibiotic   I have personally spent 50 minutes involved in face-to-face and non-face-to-face activities for this patient on the day of the visit. Professional time spent includes the following activities: Preparing to see the patient (review of tests), Obtaining and/or reviewing separately obtained history (admission/discharge record), Performing a medically appropriate examination and/or  evaluation , Ordering medications/tests/procedures, referring and communicating with other health care professionals, Documenting clinical information in the EMR, Independently interpreting results (not separately reported), Communicating results to the patient/family/caregiver, Counseling and educating the patient/family/caregiver and Care coordination (not separately reported).   Evaluation of the patient requires complex antimicrobial therapy evaluation, counseling , isolation needs to reduce disease transmission and risk assessment and mitigation.     LOS: 5 days   Jomarie Fleeta Rothman 03/14/2024, 4:30 PM

## 2024-03-14 NOTE — Anesthesia Postprocedure Evaluation (Signed)
 Anesthesia Post Note  Patient: [name]  Procedure(s) Performed: TRANSESOPHAGEAL ECHOCARDIOGRAM     Patient location during evaluation: PACU Anesthesia Type: MAC Level of consciousness: awake and alert Pain management: pain level controlled Vital Signs Assessment: post-procedure vital signs reviewed and stable Respiratory status: spontaneous breathing, nonlabored ventilation, respiratory function stable and patient connected to nasal cannula oxygen  Cardiovascular status: blood pressure returned to baseline and stable Postop Assessment: no apparent nausea or vomiting Anesthetic complications: no   No notable events documented.  Last Vitals:  Vitals:   03/14/24 1100 03/14/24 1152  BP: (!) 138/95 (!) 141/95  Pulse: 79 81  Resp: 14 16  Temp:  36.7 C  SpO2: 96% 100%    Last Pain:  Vitals:   03/14/24 1152  TempSrc: Oral  PainSc:                  [name]

## 2024-03-14 NOTE — CV Procedure (Signed)
     PROCEDURE NOTE:  Procedure:  Transesophageal echocardiogram Operator:  Wilbert Bihari, MD Indications:  MSSA Bacteremia Complications: None  During this procedure the patient is administered a total of Propofol  275 mg and Lidocaine  100 mg to achieve and maintain moderate conscious sedation.  The patient's heart rate, blood pressure, and oxygen  saturation are monitored continuously during the procedure. The period of conscious sedation is 13 minutes, of which I was present face-to-face 100% of this time. Omotara Kufeji, CRNA is an independent, trained observer who assisted in the monitoring of the patient's level of consciousness.    Results: Normal LV size and hyperdynamic LVF EF 60% Normal RV size and function Normal RA Normal LA and very small LA appendage Normal TV with trivial TR Normal PV Normal MV with trivial MR Normal trileaflet AV Hypermobile interatrial septum with no evidence of shunt by colorflow dopper or agitated saline contrast study Normal thoracic and ascending aorta.  The patient tolerated the procedure well and was transferred back to their room in stable condition.  Signed: Wilbert Bihari, MD [facility]

## 2024-03-14 NOTE — Inpatient Diabetes Management (Signed)
 Inpatient Diabetes Program Recommendations  AACE/ADA: New Consensus Statement on Inpatient Glycemic Control (2015)  Target Ranges:  Prepandial:   less than 140 mg/dL      Peak postprandial:   less than 180 mg/dL (1-2 hours)      Critically ill patients:  140 - 180 mg/dL   Lab Results  Component Value Date   GLUCAP 233 (H) 03/14/2024   HGBA1C 12.9 (H) 03/10/2024    Review of Glycemic Control  Latest Reference Range & Units 03/13/24 06:21 03/13/24 11:32 03/13/24 16:10 03/13/24 21:39 03/14/24 06:22  Glucose-Capillary 70 - 99 mg/dL 756 (H) 802 (H) 760 (H) 306 (H) 233 (H)  (H): Data is abnormally high  Diabetes history: DM 2 Outpatient Diabetes medications: Basaglar  60 units qhs, Novolog  5 units tid, Metformin  2000 mg Daily Current orders for Inpatient glycemic control:  Semglee  30 units qhs Novolog  0-15 units tid  Novolog  4 units tid meal coverage   Inpatient Diabetes Program Recommendations:     Semglee  40 units at bedtime Novolog  6 units TID with meals    Thank you, Wyvonna Pinal, MSN, CDCES Diabetes Coordinator Inpatient Diabetes Program [phone] (team pager from 8a-5p)

## 2024-03-14 NOTE — Transfer of Care (Signed)
 Immediate Anesthesia Transfer of Care Note  Patient: [name]  Procedure(s) Performed: TRANSESOPHAGEAL ECHOCARDIOGRAM  Patient Location: PACU  Anesthesia Type:MAC  Level of Consciousness: drowsy  Airway & Oxygen  Therapy: Patient Spontanous Breathing and Patient connected to nasal cannula oxygen   Post-op Assessment: Report given to RN and Post -op Vital signs reviewed and stable  Post vital signs: Reviewed and stable  Last Vitals:  Vitals Value Taken Time  BP    Temp    Pulse 78 03/14/24 10:30  Resp 14 03/14/24 10:30  SpO2 97 % 03/14/24 10:30  Vitals shown include unfiled device data.  Last Pain:  Vitals:   03/14/24 0954  TempSrc:   PainSc: 0-No pain      Patients Stated Pain Goal: 0 (03/13/24 0056)  Complications: No notable events documented.

## 2024-03-14 NOTE — Interval H&P Note (Signed)
 History and Physical Interval Note:  03/14/2024 9:41 AM  [name]  has presented today for surgery, with the diagnosis of Bacteremia.  The various methods of treatment have been discussed with the patient and family. After consideration of risks, benefits and other options for treatment, the patient has consented to  Procedure(s): TRANSESOPHAGEAL ECHOCARDIOGRAM (N/A) as a surgical intervention.  The patient's history has been reviewed, patient examined, no change in status, stable for surgery.  I have reviewed the patient's chart and labs.  Questions were answered to the patient's satisfaction.     Wilbert Bihari

## 2024-03-14 NOTE — Progress Notes (Signed)
 Patient ID: [name], male   DOB: 04-22-1981, 43 y.o.   MRN: [id number] Discussed with the patient the toe infection and his recent bacteremia.  Recommended proceeding at this time with a amputation of the third toe left foot.  Patient is reluctant to consider surgery at this time.  I told him I am available to answer any questions I will reserve time for Friday for surgery.

## 2024-03-14 NOTE — Hospital Course (Signed)
 Patient is a 43 year old male with past medical history significant for diabetes mellitus type 2 and depression.  Patient did not take his medications for the last few months, possibly, related to depressive illness.  Patient could not afford some of the medications also.   Patient presented with fever, chills and left foot ulcer that has been unattended to for about a year.   MRI reported likely osteomyelitis.  Blood cultures growing MSSA.  TTE is nonrevealing.  TEE also performed on 03/14/2024 and negative for vegetations.   Orthopedic surgery was also consulted along with ID.    Assessment & Plan:    Sepsis MSSA bacteremia Left toe ulcer  -Likely secondary to infected left foot ulcer. - Patient has had ulcer for greater than 1 year. - MRI of the foot revealed chronic osteomyelitis.  -Blood culture culture grew MSSA.   -Remains on Ancef ; ID following - Endocarditis workup negative - patient endorsing unable to afford HH at this time; will discuss further with ID for final abx rec's - patient also adamantly against toe amputation as well - theoretically if no further interventions planned in hospital, he is stable for discharge and outpatient wound care - risks of inadequate source control have been explained at length by myself and ID, with no sway in patient decision of declining surgery - Tentative plan is oritavancin  prior to discharge then oral zyvox at discharge for 4 week course  Uncontrolled diabetes mellitus type 2 -A1c of 12.9% - Patient has been noncompliant - Input from diabetic educator is highly appreciated -Continue subcutaneous Semglee  30 units every night, subcutaneous NovoLog  4 units with meals 3 times daily, and sliding scale insulin  coverage - Continue to monitor blood sugar and adjust insulin  accordingly   Acute renal failure - Likely prerenal - Resolved with hydration   Syncope: - Likely from dehydration.    -Echo was non-revealing.     Depression: -  Psychiatric input is appreciated.   -- Resume sertraline .   Cholelithiasis: -Noted on CT, no inflammatory changes

## 2024-03-14 NOTE — Plan of Care (Signed)
  Problem: Skin Integrity: Goal: Risk for impaired skin integrity will decrease Outcome: Progressing   Problem: Tissue Perfusion: Goal: Adequacy of tissue perfusion will improve Outcome: Progressing   Problem: Education: Goal: Knowledge of General Education information will improve Description: Including pain rating scale, medication(s)/side effects and non-pharmacologic comfort measures Outcome: Progressing   Problem: Health Behavior/Discharge Planning: Goal: Ability to manage health-related needs will improve Outcome: Progressing   Problem: Clinical Measurements: Goal: Ability to maintain clinical measurements within normal limits will improve Outcome: Progressing Goal: Will remain free from infection Outcome: Progressing Goal: Diagnostic test results will improve Outcome: Progressing Goal: Respiratory complications will improve Outcome: Progressing   Problem: Coping: Goal: Level of anxiety will decrease Outcome: Progressing   Problem: Skin Integrity: Goal: Risk for impaired skin integrity will decrease Outcome: Progressing   Problem: Education: Goal: Ability to describe self-care measures that may prevent or decrease complications (Diabetes Survival Skills Education) will improve Outcome: Adequate for Discharge Goal: Individualized Educational Video(s) Outcome: Adequate for Discharge   Problem: Coping: Goal: Ability to adjust to condition or change in health will improve Outcome: Adequate for Discharge   Problem: Fluid Volume: Goal: Ability to maintain a balanced intake and output will improve Outcome: Adequate for Discharge   Problem: Health Behavior/Discharge Planning: Goal: Ability to identify and utilize available resources and services will improve Outcome: Adequate for Discharge Goal: Ability to manage health-related needs will improve Outcome: Adequate for Discharge   Problem: Metabolic: Goal: Ability to maintain appropriate glucose levels will  improve Outcome: Adequate for Discharge   Problem: Nutritional: Goal: Maintenance of adequate nutrition will improve Outcome: Adequate for Discharge Goal: Progress toward achieving an optimal weight will improve Outcome: Adequate for Discharge   Problem: Clinical Measurements: Goal: Cardiovascular complication will be avoided Outcome: Adequate for Discharge   Problem: Activity: Goal: Risk for activity intolerance will decrease Outcome: Completed/Met   Problem: Nutrition: Goal: Adequate nutrition will be maintained Outcome: Completed/Met   Problem: Elimination: Goal: Will not experience complications related to bowel motility Outcome: Completed/Met Goal: Will not experience complications related to urinary retention Outcome: Completed/Met   Problem: Pain Managment: Goal: General experience of comfort will improve and/or be controlled Outcome: Completed/Met   Problem: Safety: Goal: Ability to remain free from injury will improve Outcome: Completed/Met

## 2024-03-14 NOTE — Progress Notes (Signed)
 Progress Note    [name]   FMW:[id number]  DOB: [birth year]  DOA: 03/09/2024     5 PCP: Vicci Barnie NOVAK, MD  Initial CC: Foot infection  Hospital Course: Patient is a 43 year old male with past medical history significant for diabetes mellitus type 2 and depression.  Patient did not take his medications for the last few months, possibly, related to depressive illness.  Patient could not afford some of the medications also.   Patient presented with fever, chills and left foot ulcer that has been unattended to for about a year.   MRI reported likely osteomyelitis.  Blood cultures growing MSSA.  TTE is nonrevealing.  TEE also performed on 03/14/2024 and negative for vegetations.   Orthopedic surgery was also consulted along with ID.    Assessment & Plan:    Sepsis MSSA bacteremia Left toe ulcer  -Likely secondary to infected left foot ulcer. - Patient has had ulcer for greater than 1 year. - MRI of the foot revealed chronic osteomyelitis.  -Blood culture culture grew MSSA.   - Follow wound culture. -Remains on Ancef ; ID following - Endocarditis workup negative - patient endorsing unable to afford HH at this time; will discuss further with ID for final abx rec's - potential plan for inpatient toe amputation as well   Uncontrolled diabetes mellitus type 2: -A1c of 12.9%. - Patient has been noncompliant. - Input from diabetic educator is highly appreciated.   -Continue subcutaneous Semglee  30 units every night, subcutaneous NovoLog  4 units with meals 3 times daily, and sliding scale insulin  coverage. - Continue to monitor blood sugar and adjust insulin  accordingly.      Acute renal failure: - Likely prerenal. - Resolved with hydration.   Syncope: - Likely from dehydration.    -Echo was non-revealing.     Depression: - Psychiatric input is appreciated.   -- Resume sertraline .   Cholelithiasis: -Noted on CT, no inflammatory changes  Interval History:   Underwent TEE today.  Negative for vegetations. Understands ongoing treatment for left foot infection.  Endorses he would not be able to afford home health when discussing antibiotic options.   Antimicrobials: Cefepime  03/09/2024 >> 03/11/2024 Flagyl  03/09/2024 >> 03/10/2024 Vancomycin  03/09/2024 >> 03/10/2024 Ancef  03/11/2024 >> current  DVT prophylaxis:  heparin  injection 5,000 Units Start: 03/09/24 2245   Code Status:   Code Status: Full Code  Mobility Assessment (Last 72 Hours)     Mobility Assessment     Row Name 03/14/24 0800 03/13/24 2010 03/13/24 1057 03/12/24 2000 03/12/24 0800   Does the patient have exclusion criteria? No - Perform mobility assessment No - Perform mobility assessment No - Perform mobility assessment No - Perform mobility assessment No - Perform mobility assessment   What is the highest level of mobility based on the mobility assessment? Level 5 (Ambulates independently) - Balance while walking independently - Complete Level 5 (Ambulates independently) - Balance while walking independently - Complete Level 5 (Ambulates independently) - Balance while walking independently - Complete Level 5 (Ambulates independently) - Balance while walking independently - Complete Level 4 (Ambulates with assistance) - Balance while stepping forward/back - Complete    Row Name 03/11/24 2000           Does the patient have exclusion criteria? No - Perform mobility assessment       What is the highest level of mobility based on the mobility assessment? Level 5 (Ambulates independently) - Balance while walking independently - Complete  Barriers to discharge: None Disposition Plan: Home HH orders placed: TBD Status is: Inpatient  Objective: Blood pressure (!) 141/95, pulse 81, temperature 98 F (36.7 C), temperature source Oral, resp. rate 16, height 6' 3 (1.905 m), weight (!) 149.7 kg, SpO2 100%.  Examination:  Physical Exam Constitutional:      General:  He is not in acute distress.    Appearance: Normal appearance.  HENT:     Head: Normocephalic and atraumatic.     Mouth/Throat:     Mouth: Mucous membranes are moist.  Eyes:     Extraocular Movements: Extraocular movements intact.  Cardiovascular:     Rate and Rhythm: Normal rate and regular rhythm.  Pulmonary:     Effort: Pulmonary effort is normal. No respiratory distress.     Breath sounds: Normal breath sounds. No wheezing.  Abdominal:     General: Bowel sounds are normal. There is no distension.     Palpations: Abdomen is soft.     Tenderness: There is no abdominal tenderness.  Musculoskeletal:     Cervical back: Normal range of motion and neck supple.     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  Neurological:     General: No focal deficit present.     Mental Status: He is alert.  Psychiatric:        Mood and Affect: Mood normal.        Behavior: Behavior normal.      Consultants:  Orthopedic surgery ID Cardiology  Procedures:  03/14/2024: TEE  Data Reviewed: Results for orders placed or performed during the hospital encounter of 03/09/24 (from the past 24 hours)  Glucose, capillary     Status: Abnormal   Collection Time: 03/13/24  4:10 PM  Result Value Ref Range   Glucose-Capillary 239 (H) 70 - 99 mg/dL  Glucose, capillary     Status: Abnormal   Collection Time: 03/13/24  9:39 PM  Result Value Ref Range   Glucose-Capillary 306 (H) 70 - 99 mg/dL  Sedimentation rate     Status: Abnormal   Collection Time: 03/14/24  2:01 AM  Result Value Ref Range   Sed Rate 24 (H) 0 - 16 mm/hr  C-reactive protein     Status: Abnormal   Collection Time: 03/14/24  2:01 AM  Result Value Ref Range   CRP 1.2 (H) <1.0 mg/dL  Glucose, capillary     Status: Abnormal   Collection Time: 03/14/24  6:22 AM  Result Value Ref Range    Glucose-Capillary 233 (H) 70 - 99 mg/dL    I have reviewed pertinent nursing notes, vitals, labs, and images as necessary. I have ordered labwork to follow up on as indicated.  I have reviewed the last notes from staff over past 24 hours. I have discussed patient's care plan and test results with nursing staff, CM/SW, and other staff as appropriate.  Old records reviewed in assessment of this patient  Time spent: Greater than 50% of the 55 minute visit was spent in counseling/coordination of care for the patient as laid out in the A&P.   LOS: 5 days   Alm Apo, MD [facility] 03/14/2024, 3:40 PM

## 2024-03-14 NOTE — Anesthesia Preprocedure Evaluation (Signed)
 Anesthesia Evaluation  Patient identified by MRN, date of birth, ID band Patient awake    Reviewed: Allergy & Precautions, NPO status , Patient's Chart, lab work & pertinent test results, reviewed documented beta blocker date and time   History of Anesthesia Complications Negative for: history of anesthetic complications  Airway Mallampati: II  TM Distance: >3 FB     Dental  (+) Poor Dentition   Pulmonary neg sleep apnea, neg COPD   breath sounds clear to auscultation       Cardiovascular hypertension, + Peripheral Vascular Disease  + Valvular Problems/Murmurs  Rhythm:Regular Rate:Normal  IMPRESSIONS     1. Left ventricular ejection fraction, by estimation, is 70 to 75%. The  left ventricle has hyperdynamic function. The left ventricle has no  regional wall motion abnormalities. There is mild concentric left  ventricular hypertrophy. Left ventricular  diastolic parameters were normal.   2. Right ventricular systolic function is normal. The right ventricular  size is normal. Tricuspid regurgitation signal is inadequate for assessing  PA pressure.   3. The mitral valve is normal in structure. No evidence of mitral valve  regurgitation. No evidence of mitral stenosis.   4. Possible bicuspid aortic valve. The aortic valve was not well  visualized. Unable to determine aortic valve morphology due to image  quality. Aortic valve regurgitation is not visualized. Aortic valve  sclerosis is present, with no evidence of aortic  valve stenosis.   5. The inferior vena cava is dilated in size with >50% respiratory  variability, suggesting right atrial pressure of 8 mmHg.     Neuro/Psych neg Seizures PSYCHIATRIC DISORDERS Anxiety Depression Bipolar Disorder      GI/Hepatic ,,,(+) neg Cirrhosis        Endo/Other  diabetes, Type 2    Renal/GU Renal disease     Musculoskeletal   Abdominal   Peds  Hematology   Anesthesia  Other Findings   Reproductive/Obstetrics                              Anesthesia Physical Anesthesia Plan  ASA: 3  Anesthesia Plan: MAC   Post-op Pain Management:    Induction: Intravenous  PONV Risk Score and Plan: 2 and Ondansetron  and Propofol  infusion  Airway Management Planned: Natural Airway and Nasal Cannula  Additional Equipment:   Intra-op Plan:   Post-operative Plan:   Informed Consent: I have reviewed the patients History and Physical, chart, labs and discussed the procedure including the risks, benefits and alternatives for the proposed anesthesia with the patient or authorized representative who has indicated his/her understanding and acceptance.       Plan Discussed with: CRNA  Anesthesia Plan Comments:          Anesthesia Quick Evaluation

## 2024-03-15 ENCOUNTER — Other Ambulatory Visit (HOSPITAL_COMMUNITY): Payer: Self-pay

## 2024-03-15 ENCOUNTER — Encounter (HOSPITAL_COMMUNITY): Payer: Self-pay | Admitting: Cardiology

## 2024-03-15 LAB — GLUCOSE, CAPILLARY
Glucose-Capillary: 205 mg/dL — ABNORMAL HIGH (ref 70–99)
Glucose-Capillary: 221 mg/dL — ABNORMAL HIGH (ref 70–99)
Glucose-Capillary: 204 mg/dL — ABNORMAL HIGH (ref 70–99)
Glucose-Capillary: 282 mg/dL — ABNORMAL HIGH (ref 70–99)

## 2024-03-15 MED ORDER — ORITAVANCIN DIPHOSPHATE 400 MG IV SOLR
1200.0000 mg | Freq: Once | INTRAVENOUS | Status: AC
  Administered 2024-03-16: 1200 mg via INTRAVENOUS
  Filled 2024-03-15: qty 120

## 2024-03-15 NOTE — Inpatient Diabetes Management (Addendum)
 Inpatient Diabetes Program Recommendations  AACE/ADA: New Consensus Statement on Inpatient Glycemic Control (2015)  Target Ranges:  Prepandial:   less than 140 mg/dL      Peak postprandial:   less than 180 mg/dL (1-2 hours)      Critically ill patients:  140 - 180 mg/dL   Lab Results  Component Value Date   GLUCAP 221 (H) 03/15/2024   HGBA1C 12.9 (H) 03/10/2024    Review of Glycemic Control  Latest Reference Range & Units 03/14/24 06:22 03/14/24 16:31 03/14/24 21:15 03/15/24 06:09  Glucose-Capillary 70 - 99 mg/dL 766 (H) 725 (H) 734 (H) 221 (H)  (H): Data is abnormally high  Diabetes history: DM 2 Outpatient Diabetes medications: Basaglar  60 units qhs, Novolog  5 units tid, Metformin  2000 mg Daily Current orders for Inpatient glycemic control:  Semglee  30 units qhs Novolog  0-15 units tid  Novolog  4 units tid meal coverage   Inpatient Diabetes Program Recommendations:    Please consider:   Semglee  40 units at bedtime (reduce back to 30 units on Thursday night prior to surgery Friday) Novolog  6 units TID with meals    Thank you, Wyvonna Pinal, MSN, CDCES Diabetes Coordinator Inpatient Diabetes Program [phone] (team pager from 8a-5p)

## 2024-03-15 NOTE — Plan of Care (Signed)
  Problem: Education: Goal: Ability to describe self-care measures that may prevent or decrease complications (Diabetes Survival Skills Education) will improve Outcome: Progressing Goal: Individualized Educational Video(s) Outcome: Progressing   Problem: Skin Integrity: Goal: Risk for impaired skin integrity will decrease Outcome: Progressing   Problem: Tissue Perfusion: Goal: Adequacy of tissue perfusion will improve Outcome: Progressing   Problem: Education: Goal: Knowledge of General Education information will improve Description: Including pain rating scale, medication(s)/side effects and non-pharmacologic comfort measures Outcome: Progressing   Problem: Clinical Measurements: Goal: Will remain free from infection Outcome: Progressing Goal: Diagnostic test results will improve Outcome: Progressing   Problem: Skin Integrity: Goal: Risk for impaired skin integrity will decrease Outcome: Progressing   Problem: Coping: Goal: Ability to adjust to condition or change in health will improve Outcome: Adequate for Discharge   Problem: Fluid Volume: Goal: Ability to maintain a balanced intake and output will improve Outcome: Adequate for Discharge   Problem: Health Behavior/Discharge Planning: Goal: Ability to identify and utilize available resources and services will improve Outcome: Adequate for Discharge Goal: Ability to manage health-related needs will improve Outcome: Adequate for Discharge   Problem: Metabolic: Goal: Ability to maintain appropriate glucose levels will improve Outcome: Adequate for Discharge   Problem: Nutritional: Goal: Maintenance of adequate nutrition will improve Outcome: Adequate for Discharge Goal: Progress toward achieving an optimal weight will improve Outcome: Adequate for Discharge   Problem: Health Behavior/Discharge Planning: Goal: Ability to manage health-related needs will improve Outcome: Adequate for Discharge   Problem:  Clinical Measurements: Goal: Ability to maintain clinical measurements within normal limits will improve Outcome: Adequate for Discharge Goal: Respiratory complications will improve Outcome: Adequate for Discharge Goal: Cardiovascular complication will be avoided Outcome: Adequate for Discharge   Problem: Coping: Goal: Level of anxiety will decrease Outcome: Adequate for Discharge

## 2024-03-15 NOTE — Progress Notes (Signed)
 Subjective:  No new complaints    Antibiotics:  Anti-infectives (From admission, onward)    Start     Dose/Rate Route Frequency Ordered Stop   03/11/24 0600  ceFAZolin  (ANCEF ) IVPB 2g/100 mL premix        2 g 200 mL/hr over 30 Minutes Intravenous Every 8 hours 03/11/24 0238     03/10/24 0900  vancomycin  (VANCOREADY) IVPB 1750 mg/350 mL  Status:  Discontinued        1,750 mg 175 mL/hr over 120 Minutes Intravenous Every 12 hours 03/09/24 2257 03/11/24 0238   03/10/24 0500  metroNIDAZOLE  (FLAGYL ) IVPB 500 mg  Status:  Discontinued        500 mg 100 mL/hr over 60 Minutes Intravenous Every 12 hours 03/09/24 2246 03/11/24 0238   03/10/24 0000  ceFEPIme  (MAXIPIME ) 2 g in sodium chloride  0.9 % 100 mL IVPB  Status:  Discontinued        2 g 200 mL/hr over 30 Minutes Intravenous  Once 03/09/24 2246 03/09/24 2257   03/09/24 2300  vancomycin  (VANCOCIN ) IVPB 1000 mg/200 mL premix  Status:  Discontinued        1,000 mg 200 mL/hr over 60 Minutes Intravenous  Once 03/09/24 2246 03/09/24 2248   03/09/24 2256  ceFEPIme  (MAXIPIME ) 2 g in sodium chloride  0.9 % 100 mL IVPB  Status:  Discontinued        2 g 200 mL/hr over 30 Minutes Intravenous Every 8 hours 03/09/24 2257 03/11/24 0238   03/09/24 1515  ceFEPIme  (MAXIPIME ) 2 g in sodium chloride  0.9 % 100 mL IVPB        2 g 200 mL/hr over 30 Minutes Intravenous  Once 03/09/24 1506 03/09/24 1637   03/09/24 1515  metroNIDAZOLE  (FLAGYL ) IVPB 500 mg        500 mg 100 mL/hr over 60 Minutes Intravenous  Once 03/09/24 1506 03/09/24 1800   03/09/24 1515  vancomycin  (VANCOCIN ) IVPB 1000 mg/200 mL premix  Status:  Discontinued        1,000 mg 200 mL/hr over 60 Minutes Intravenous  Once 03/09/24 1506 03/09/24 1511   03/09/24 1515  vancomycin  (VANCOCIN ) 2,500 mg in sodium chloride  0.9 % 500 mL IVPB        2,500 mg 262.5 mL/hr over 120 Minutes Intravenous  Once 03/09/24 1511 03/09/24 2304       Medications: Scheduled Meds:  heparin   5,000 Units  Subcutaneous Q8H   insulin  aspart  0-15 Units Subcutaneous TID WC   insulin  aspart  4 Units Subcutaneous TID WC   insulin  glargine-yfgn  30 Units Subcutaneous QHS   sertraline   25 mg Oral Daily   Continuous Infusions:   ceFAZolin  (ANCEF ) IV 2 g (03/15/24 0535)   PRN Meds:.    Objective: Weight change:   Intake/Output Summary (Last 24 hours) at 03/15/2024 1130 Last data filed at 03/15/2024 9662 Gross per 24 hour  Intake 740 ml  Output --  Net 740 ml   Blood pressure 123/83, pulse 78, temperature (!) 97.4 F (36.3 C), temperature source Oral, resp. rate 18, height 6' 3 (1.905 m), weight (!) 149.7 kg, SpO2 97%. Temp:  [97.4 F (36.3 C)-98.9 F (37.2 C)] 97.4 F (36.3 C) (10/22 0818) Pulse Rate:  [78-95] 78 (10/22 0818) Resp:  [16-20] 18 (10/22 0818) BP: (103-141)/(74-95) 123/83 (10/22 0818) SpO2:  [93 %-100 %] 97 % (10/22 0818)  Physical Exam: Physical Exam Constitutional:      Appearance: Normal appearance.  Eyes:     General:        Right eye: No discharge.        Left eye: No discharge.     Extraocular Movements: Extraocular movements intact.     Pupils: Pupils are equal, round, and reactive to light.  Cardiovascular:     Rate and Rhythm: Normal rate and regular rhythm.  Pulmonary:     Effort: No respiratory distress.     Breath sounds: No wheezing.  Abdominal:     General: There is no distension.  Musculoskeletal:     Cervical back: Normal range of motion and neck supple.  Skin:    General: Skin is warm and dry.  Neurological:     General: No focal deficit present.     Mental Status: He is alert.  Psychiatric:        Mood and Affect: Mood normal.        Behavior: Behavior normal.        Thought Content: Thought content normal.        Judgment: Judgment normal.      Left lower leg 03/13/2024:           CBC:    BMET Recent Labs    03/13/24 0536  NA 136  K 3.8  CL 102  CO2 26  GLUCOSE 238*  BUN 8  CREATININE 0.68  CALCIUM  8.6*      Liver Panel  Recent Labs    03/13/24 0536  ALBUMIN 2.8*       Sedimentation Rate Recent Labs    03/14/24 0201  ESRSEDRATE 24*   C-Reactive Protein Recent Labs    03/14/24 0201  CRP 1.2*    Micro Results: Recent Results (from the past 720 hours)  Resp panel by RT-PCR (RSV, Flu A&B, Covid) Anterior Nasal Swab     Status: None   Collection Time: 03/09/24  3:06 PM   Specimen: Anterior Nasal Swab  Result Value Ref Range Status   SARS Coronavirus 2 by RT PCR NEGATIVE NEGATIVE Final   Influenza A by PCR NEGATIVE NEGATIVE Final   Influenza B by PCR NEGATIVE NEGATIVE Final    Comment: (NOTE) The Xpert Xpress SARS-CoV-2/FLU/RSV plus assay is intended as an aid in the diagnosis of influenza from Nasopharyngeal swab specimens and should not be used as a sole basis for treatment. Nasal washings and aspirates are unacceptable for Xpert Xpress SARS-CoV-2/FLU/RSV testing.  Fact Sheet for Patients: BloggerCourse.com  Fact Sheet for Healthcare Providers: SeriousBroker.it  This test is not yet approved or cleared by the [place]  FDA and has been authorized for detection and/or diagnosis of SARS-CoV-2 by FDA under an Emergency Use Authorization (EUA). This EUA will remain in effect (meaning this test can be used) for the duration of the COVID-19 declaration under Section 564(b)(1) of the Act, 21 U.S.C. section 360bbb-3(b)(1), unless the authorization is terminated or revoked.     Resp Syncytial Virus by PCR NEGATIVE NEGATIVE Final    Comment: (NOTE) Fact Sheet for Patients: BloggerCourse.com  Fact Sheet for Healthcare Providers: SeriousBroker.it  This test is not yet approved or cleared by the [place]  FDA and has been authorized for detection and/or diagnosis of SARS-CoV-2 by FDA under an Emergency Use Authorization (EUA). This EUA will remain in effect  (meaning this test can be used) for the duration of the COVID-19 declaration under Section 564(b)(1) of the Act, 21 U.S.C. section 360bbb-3(b)(1), unless the authorization is terminated or revoked.  Performed at [name]  Hospital Lab, [address] [place], [place] [address]   Culture, blood (Routine x 2)     Status: Abnormal   Collection Time: 03/09/24  3:39 PM   Specimen: BLOOD RIGHT HAND  Result Value Ref Range Status   Specimen Description BLOOD RIGHT HAND  Final   Special Requests   Final    BOTTLES DRAWN AEROBIC AND ANAEROBIC Blood Culture results may not be optimal due to an inadequate volume of blood received in culture bottles   Culture  Setup Time   Final    GRAM POSITIVE COCCI AEROBIC BOTTLE ONLY GREG ABBOTT PHARM D 03/11/2024 @ 0219 BY DD Performed at [facility] Lab, [address] [place], [place] [address]    Culture STAPHYLOCOCCUS AUREUS (A)  Final   Report Status 03/13/2024 FINAL  Final   Organism ID, Bacteria STAPHYLOCOCCUS AUREUS  Final      Susceptibility   Staphylococcus aureus - MIC*    CIPROFLOXACIN  <=0.5 SENSITIVE Sensitive     ERYTHROMYCIN <=0.25 SENSITIVE Sensitive     GENTAMICIN <=0.5 SENSITIVE Sensitive     OXACILLIN <=0.25 SENSITIVE Sensitive     TETRACYCLINE <=1 SENSITIVE Sensitive     VANCOMYCIN  1 SENSITIVE Sensitive     TRIMETH /SULFA  <=10 SENSITIVE Sensitive     CLINDAMYCIN <=0.25 SENSITIVE Sensitive     RIFAMPIN <=0.5 SENSITIVE Sensitive     Inducible Clindamycin NEGATIVE Sensitive     LINEZOLID 2 SENSITIVE Sensitive     * STAPHYLOCOCCUS AUREUS  Blood Culture ID Panel (Reflexed)     Status: Abnormal   Collection Time: 03/09/24  3:39 PM  Result Value Ref Range Status   Enterococcus faecalis NOT DETECTED NOT DETECTED Final   Enterococcus Faecium NOT DETECTED NOT DETECTED Final   Listeria monocytogenes NOT DETECTED NOT DETECTED Final   Staphylococcus species DETECTED (A) NOT DETECTED Final    Comment: CRITICAL RESULT CALLED TO, READ BACK  BY AND VERIFIED WITH: GREG ABBOTT PHARM D 03/11/2024 @ 0219 BY DD    Staphylococcus aureus (BCID) DETECTED (A) NOT DETECTED Final    Comment: CRITICAL RESULT CALLED TO, READ BACK BY AND VERIFIED WITH: GREG ABBOTT PHARM D 03/11/2024 @ 0219 BY DD    Staphylococcus epidermidis NOT DETECTED NOT DETECTED Final   Staphylococcus lugdunensis NOT DETECTED NOT DETECTED Final   Streptococcus species NOT DETECTED NOT DETECTED Final   Streptococcus agalactiae NOT DETECTED NOT DETECTED Final   Streptococcus pneumoniae NOT DETECTED NOT DETECTED Final   Streptococcus pyogenes NOT DETECTED NOT DETECTED Final   A.calcoaceticus-baumannii NOT DETECTED NOT DETECTED Final   Bacteroides fragilis NOT DETECTED NOT DETECTED Final   Enterobacterales NOT DETECTED NOT DETECTED Final   Enterobacter cloacae complex NOT DETECTED NOT DETECTED Final   Escherichia coli NOT DETECTED NOT DETECTED Final   Klebsiella aerogenes NOT DETECTED NOT DETECTED Final   Klebsiella oxytoca NOT DETECTED NOT DETECTED Final   Klebsiella pneumoniae NOT DETECTED NOT DETECTED Final   Proteus species NOT DETECTED NOT DETECTED Final   Salmonella species NOT DETECTED NOT DETECTED Final   Serratia marcescens NOT DETECTED NOT DETECTED Final   Haemophilus influenzae NOT DETECTED NOT DETECTED Final   Neisseria meningitidis NOT DETECTED NOT DETECTED Final   Pseudomonas aeruginosa NOT DETECTED NOT DETECTED Final   Stenotrophomonas maltophilia NOT DETECTED NOT DETECTED Final   Candida albicans NOT DETECTED NOT DETECTED Final   Candida auris NOT DETECTED NOT DETECTED Final   Candida glabrata NOT DETECTED NOT DETECTED Final   Candida krusei NOT DETECTED NOT DETECTED  Final   Candida parapsilosis NOT DETECTED NOT DETECTED Final   Candida tropicalis NOT DETECTED NOT DETECTED Final   Cryptococcus neoformans/gattii NOT DETECTED NOT DETECTED Final   Meth resistant mecA/C and MREJ NOT DETECTED NOT DETECTED Final    Comment: Performed at [name] Lab, [address] [place], [place] [address]  Culture, blood (Routine x 2)     Status: None   Collection Time: 03/09/24  7:27 PM   Specimen: BLOOD LEFT HAND  Result Value Ref Range Status   Specimen Description BLOOD LEFT HAND  Final   Special Requests   Final    BOTTLES DRAWN AEROBIC ONLY Blood Culture results may not be optimal due to an inadequate volume of blood received in culture bottles   Culture   Final    NO GROWTH 5 DAYS Performed at [facility] Lab, [address], [place], [place] [address]    Report Status 03/14/2024 FINAL  Final  Culture, blood (Routine X 2) w Reflex to ID Panel     Status: None (Preliminary result)   Collection Time: 03/11/24  9:09 AM   Specimen: BLOOD LEFT HAND  Result Value Ref Range Status   Specimen Description BLOOD LEFT HAND  Final   Special Requests   Final    BOTTLES DRAWN AEROBIC AND ANAEROBIC Blood Culture results may not be optimal due to an inadequate volume of blood received in culture bottles   Culture   Final    NO GROWTH 4 DAYS Performed at [facility] Lab, [address], [place], [place] [address]    Report Status PENDING  Incomplete  Culture, blood (Routine X 2) w Reflex to ID Panel     Status: None (Preliminary result)   Collection Time: 03/11/24  9:16 AM   Specimen: BLOOD  Result Value Ref Range Status   Specimen Description BLOOD LEFT ANTECUBITAL  Final   Special Requests   Final    BOTTLES DRAWN AEROBIC AND ANAEROBIC Blood Culture results may not be optimal due to an inadequate volume of blood received in culture bottles   Culture   Final    NO GROWTH 4 DAYS Performed at [facility] Lab, [address] [place], [place] [address]    Report Status PENDING  Incomplete    Studies/Results: ECHO TEE Result Date: 03/14/2024    TRANSESOPHOGEAL ECHO REPORT   Patient Name:   [name] Date of Exam: 03/14/2024 Medical Rec #:  [id number]           Height:       75.0 in Accession #:    [id number]           Weight:       330.0 lb Date of Birth:  12-07-1980           BSA:          2.715 m Patient Age:    42 years            BP:           133/87 mmHg Patient Gender: M                   HR:           87 bpm. Exam Location:  Inpatient Procedure: Color Doppler, Cardiac Doppler, Transesophageal Echo and Saline            Contrast Bubble Study (Both Spectral and Color Flow Doppler were  utilized during procedure). Indications:     Bacteremia  History:         Patient has prior history of Echocardiogram examinations, most                  recent 03/10/2024.  Sonographer:     Tinnie Gosling RDCS Referring Phys:  [id number] THOM CROME HALEY Diagnosing Phys: Wilbert Bihari MD PROCEDURE: After discussion of the risks and benefits of a TEE, an informed consent was obtained from the patient. TEE procedure time was 13 minutes. The transesophogeal probe was passed without difficulty through the esophogus of the patient. Imaged were obtained with the patient in a left lateral decubitus position. Sedation performed by different physician. Image quality was excellent. The patient's vital signs; including heart rate, blood pressure, and oxygen  saturation; remained stable throughout the procedure. The patient developed no complications during the procedure.  IMPRESSIONS  1. Left ventricular ejection fraction, by estimation, is 60 to 65%. The left ventricle has normal function. The left ventricle has no regional wall motion abnormalities.  2. Right ventricular systolic function is normal. The right ventricular size is normal.  3. No left atrial/left atrial appendage thrombus was detected. The LAA emptying velocity was 80 cm/s.  4. The mitral valve is normal in structure. Trivial mitral valve regurgitation. No evidence of mitral stenosis.  5. The aortic valve is normal in structure. Aortic valve regurgitation is not visualized. No aortic stenosis is present.  6. The inferior vena cava is normal in size with greater than 50% respiratory  variability, suggesting right atrial pressure of 3 mmHg. Conclusion(s)/Recommendation(s): Normal biventricular function without evidence of hemodynamically significant valvular heart disease. No evidence of vegetation/infective endocarditis on this transesophageael echocardiogram. FINDINGS  Left Ventricle: Left ventricular ejection fraction, by estimation, is 60 to 65%. The left ventricle has normal function. The left ventricle has no regional wall motion abnormalities. The left ventricular internal cavity size was normal in size. There is  no left ventricular hypertrophy. Right Ventricle: The right ventricular size is normal. No increase in right ventricular wall thickness. Right ventricular systolic function is normal. Left Atrium: Left atrial size was normal in size. No left atrial/left atrial appendage thrombus was detected. The LAA emptying velocity was 80 cm/s. Right Atrium: Right atrial size was normal in size. Pericardium: There is no evidence of pericardial effusion. Mitral Valve: The mitral valve is normal in structure. Trivial mitral valve regurgitation. No evidence of mitral valve stenosis. Tricuspid Valve: The tricuspid valve is normal in structure. Tricuspid valve regurgitation is trivial. No evidence of tricuspid stenosis. Aortic Valve: The aortic valve is normal in structure. Aortic valve regurgitation is not visualized. No aortic stenosis is present. Pulmonic Valve: The pulmonic valve was normal in structure. Pulmonic valve regurgitation is not visualized. No evidence of pulmonic stenosis. Aorta: The aortic root is normal in size and structure. Venous: The inferior vena cava is normal in size with greater than 50% respiratory variability, suggesting right atrial pressure of 3 mmHg. IAS/Shunts: There is redundancy of the interatrial septum. No atrial level shunt detected by color flow Doppler. Agitated saline contrast was given intravenously to evaluate for intracardiac shunting. Wilbert Bihari MD  Electronically signed by Wilbert Bihari MD Signature Date/Time: 03/14/2024/3:14:17 PM    Final    EP STUDY Result Date: 03/14/2024 See surgical note for result.  VAS US  ABI WITH/WO TBI Result Date: 03/14/2024  LOWER EXTREMITY DOPPLER STUDY Patient Name:  [name]  Date of Exam:   03/13/2024 Medical Rec #:  [id number]            Accession #:    [id number] Date of Birth: 09-08-1980            Patient Gender: M Patient Age:   38 years Exam Location:  [facility] Procedure:      VAS US  ABI WITH/WO TBI Referring Phys: SYLVESTER OGBATA --------------------------------------------------------------------------------  Indications: Peripheral artery disease. High Risk Factors: Hypertension, hyperlipidemia, Diabetes, no history of                    smoking.  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 Comparison Study: No prior exam. Performing Technologist: Edilia Elden Appl  Examination Guidelines: A complete evaluation includes at minimum, Doppler waveform signals and systolic blood pressure reading at the level of bilateral brachial, anterior tibial, and posterior tibial arteries, when vessel segments are accessible. Bilateral testing is considered an integral part of a complete examination. Photoelectric Plethysmograph (PPG) waveforms and toe systolic pressure readings are included as required and additional duplex testing as needed. Limited examinations for reoccurring indications may be performed as noted.  ABI Findings: +---------+------------------+-----+---------+----------+ Right    Rt Pressure (mmHg)IndexWaveform Comment    +---------+------------------+-----+---------+----------+ Brachial 141                    triphasic           +---------+------------------+-----+---------+----------+ PTA      161               1.14  triphasic           +---------+------------------+-----+---------+----------+ DP       152               1.08 triphasic           +---------+------------------+-----+---------+----------+ Great Toe177               1.26 Normal   2nd digit. +---------+------------------+-----+---------+----------+ +---------+------------------+-----+---------+----------+ Left     Lt Pressure (mmHg)IndexWaveform Comment    +---------+------------------+-----+---------+----------+ Brachial 132                    triphasic           +---------+------------------+-----+---------+----------+ PTA      182               1.29 triphasic           +---------+------------------+-----+---------+----------+ DP       173               1.23 triphasic           +---------+------------------+-----+---------+----------+ Great Toe180               1.28 Normal   2nd digit. +---------+------------------+-----+---------+----------+ +-------+-----------+-----------+------------+------------+ ABI/TBIToday's ABIToday's TBIPrevious ABIPrevious TBI +-------+-----------+-----------+------------+------------+ Right  1.14       1.26                                +-------+-----------+-----------+------------+------------+ Left   1.29       1.28                                +-------+-----------+-----------+------------+------------+  Summary: Right: Resting right ankle-brachial index is within normal range. The right toe-brachial index is normal.  Left: Resting left ankle-brachial index is within normal range. The left toe-brachial index is normal.  *See table(s) above for measurements and observations.  Electronically signed by Debby Robertson on 03/14/2024 at 10:25:43 AM.    Final       Assessment/Plan:  INTERVAL HISTORY: pt is refusing amputatoin   Principal Problem:   MSSA bacteremia Active Problems:   Uncontrolled type 2 diabetes mellitus with hyperglycemia (HCC)   Major depressive  disorder, recurrent episode, moderate (HCC)   Left foot infection   Sepsis (HCC)   Pressure injury of skin   Acute osteomyelitis of left foot (HCC)    [name] is a 43 y.o. 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  I have again explained to him that he really needs this for source control to prevent him from being readmitted for bacteremia and sepsis with risk of spread to the heart valves the next time this might happen or even potentially death.  There is also certainly risk of spread of the infection in the bones of the feet requiring more morbid surgery down the road  He is not comfortable going home with IV antibiotics due to him not having insurance.  For now we will continue IV cefazolin  in the hospital.  When he appears appropriate for discharge and provided he does not change his mind about surgery we will plan on giving him a one-time dose of oritavancin  followed by oral Zyvox and follow-up in our clinic   I have personally spent 51 minutes involved in face-to-face and  non-face-to-face activities for this patient on the day of the visit. Professional time spent includes the following activities: Preparing to see the patient (review of tests), Obtaining and/or reviewing separately obtained history (admission/discharge record), Performing a medically appropriate examination and/or evaluation , Ordering medications/tests/procedures, referring and communicating with other health care professionals, Documenting clinical information in the EMR, Independently interpreting results (not separately reported), Communicating results to the patient/family/caregiver, Counseling and educating the patient/family/caregiver and Care coordination (not separately reported).   Evaluation of the patient requires complex antimicrobial therapy evaluation, counseling , isolation needs to reduce disease transmission and risk assessment and mitigation.    LOS: 6 days   Jomarie Fleeta Rothman 03/15/2024, 11:30 AM

## 2024-03-15 NOTE — Plan of Care (Signed)
  Problem: Fluid Volume: Goal: Ability to maintain a balanced intake and output will improve Outcome: Progressing   Problem: Health Behavior/Discharge Planning: Goal: Ability to manage health-related needs will improve Outcome: Progressing   Problem: Skin Integrity: Goal: Risk for impaired skin integrity will decrease Outcome: Progressing   Problem: Clinical Measurements: Goal: Diagnostic test results will improve Outcome: Progressing   Problem: Clinical Measurements: Goal: Will remain free from infection Outcome: Progressing   Problem: Skin Integrity: Goal: Risk for impaired skin integrity will decrease Outcome: Progressing

## 2024-03-15 NOTE — Progress Notes (Signed)
 Progress Note    [name]   FMW:[id number]  DOB: 09-Apr-1981  DOA: 03/09/2024     6 PCP: Vicci Barnie NOVAK, MD  Initial CC: Foot infection  Hospital Course: Patient is a 43 year old male with past medical history significant for diabetes mellitus type 2 and depression.  Patient did not take his medications for the last few months, possibly, related to depressive illness.  Patient could not afford some of the medications also.   Patient presented with fever, chills and left foot ulcer that has been unattended to for about a year.   MRI reported likely osteomyelitis.  Blood cultures growing MSSA.  TTE is nonrevealing.  TEE also performed on 03/14/2024 and negative for vegetations.   Orthopedic surgery was also consulted along with ID.    Assessment & Plan:    Sepsis MSSA bacteremia Left toe ulcer  -Likely secondary to infected left foot ulcer. - Patient has had ulcer for greater than 1 year. - MRI of the foot revealed chronic osteomyelitis.  -Blood culture culture grew MSSA.   -Remains on Ancef ; ID following - Endocarditis workup negative - patient endorsing unable to afford HH at this time; will discuss further with ID for final abx rec's - patient also adamantly against toe amputation as well - theoretically if no further interventions planned in hospital, he is stable for discharge and outpatient wound care - risks of inadequate source control have been explained at length by myself and ID, with no sway in patient decision of declining surgery - Tentative plan is oritavancin  prior to discharge then oral zyvox at discharge for 4 week course  Uncontrolled diabetes mellitus type 2 -A1c of 12.9% - Patient has been noncompliant - Input from diabetic educator is highly appreciated -Continue subcutaneous Semglee  30 units every night, subcutaneous NovoLog  4 units with meals 3 times daily, and sliding scale insulin  coverage - Continue to monitor blood sugar and adjust  insulin  accordingly   Acute renal failure - Likely prerenal - Resolved with hydration   Syncope: - Likely from dehydration.    -Echo was non-revealing.     Depression: - Psychiatric input is appreciated.   -- Resume sertraline .   Cholelithiasis: -Noted on CT, no inflammatory changes  Interval History:  No events overnight.  Still adamantly against toe amputation and also against IV antibiotics due to inability to afford home health. Also anxious for discharge. Explained again in detail if toe is the source of infection and goes untreated the ongoing risk of recurrent bacteremia and ongoing risk of potential endocarditis, and even worse death.  He still does not believe foot is source of infection.   Antimicrobials: Cefepime  03/09/2024 >> 03/11/2024 Flagyl  03/09/2024 >> 03/10/2024 Vancomycin  03/09/2024 >> 03/10/2024 Ancef  03/11/2024 >> current  DVT prophylaxis:  heparin  injection 5,000 Units Start: 03/09/24 2245   Code Status:   Code Status: Full Code  Mobility Assessment (Last 72 Hours)     Mobility Assessment     Row Name 03/15/24 1200 03/15/24 0800 03/14/24 2000 03/14/24 0800 03/13/24 2010   Does the patient have exclusion criteria? No - Perform mobility assessment No - Perform mobility assessment No - Perform mobility assessment No - Perform mobility assessment No - Perform mobility assessment   What is the highest level of mobility based on the mobility assessment? Level 5 (Ambulates independently) - Balance while walking independently - Complete Level 5 (Ambulates independently) - Balance while walking independently - Complete Level 5 (Ambulates independently) - Balance while walking independently - Complete  Level 5 (Ambulates independently) - Balance while walking independently - Complete Level 5 (Ambulates independently) - Balance while walking independently - Complete    Row Name 03/13/24 1057 03/12/24 2000         Does the patient have exclusion criteria? No -  Perform mobility assessment No - Perform mobility assessment      What is the highest level of mobility based on the mobility assessment? Level 5 (Ambulates independently) - Balance while walking independently - Complete Level 5 (Ambulates independently) - Balance while walking independently - Complete         Barriers to discharge: None Disposition Plan: Home HH orders placed: TBD Status is: Inpatient  Objective: Blood pressure 134/83, pulse 76, temperature 97.8 F (36.6 C), temperature source Oral, resp. rate 18, height 6' 3 (1.905 m), weight (!) 149.7 kg, SpO2 99%.  Examination:  Physical Exam Constitutional:      General: He is not in acute distress.    Appearance: Normal appearance.  HENT:     Head: Normocephalic and atraumatic.     Mouth/Throat:     Mouth: Mucous membranes are moist.  Eyes:     Extraocular Movements: Extraocular movements intact.  Cardiovascular:     Rate and Rhythm: Normal rate and regular rhythm.  Pulmonary:     Effort: Pulmonary effort is normal. No respiratory distress.     Breath sounds: Normal breath sounds. No wheezing.  Abdominal:     General: Bowel sounds are normal. There is no distension.     Palpations: Abdomen is soft.     Tenderness: There is no abdominal tenderness.  Musculoskeletal:     Cervical back: Normal range of motion and neck supple.     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  Neurological:     General: No focal deficit present.     Mental Status: He is alert.  Psychiatric:        Mood and Affect: Mood normal.        Behavior: Behavior normal.      Consultants:  Orthopedic surgery ID Cardiology  Procedures:  03/14/2024: TEE  Data Reviewed: Results for orders placed or performed during the hospital encounter of 03/09/24 (from the past 24 hours)  Glucose, capillary      Status: Abnormal   Collection Time: 03/14/24  4:31 PM  Result Value Ref Range   Glucose-Capillary 274 (H) 70 - 99 mg/dL  Glucose, capillary     Status: Abnormal   Collection Time: 03/14/24  9:15 PM  Result Value Ref Range   Glucose-Capillary 265 (H) 70 - 99 mg/dL   Comment 1 Notify RN   Glucose, capillary     Status: Abnormal   Collection Time: 03/15/24  6:09 AM  Result Value Ref Range   Glucose-Capillary 221 (H) 70 - 99 mg/dL   Comment 1 Notify RN   Glucose, capillary     Status: Abnormal   Collection Time: 03/15/24 12:04 PM  Result Value Ref Range   Glucose-Capillary 204 (H) 70 - 99 mg/dL   Comment 1 Notify RN     I have reviewed pertinent nursing notes, vitals, labs, and images as necessary. I have ordered labwork to follow up on as indicated.  I have reviewed the last notes from staff over past 24 hours. I have discussed patient's  care plan and test results with nursing staff, CM/SW, and other staff as appropriate.  Old records reviewed in assessment of this patient  Time spent: Greater than 50% of the 55 minute visit was spent in counseling/coordination of care for the patient as laid out in the A&P.   LOS: 6 days   Alm Apo, MD [facility] 03/15/2024, 3:30 PM

## 2024-03-16 ENCOUNTER — Encounter (HOSPITAL_COMMUNITY): Payer: Self-pay | Admitting: Vascular Surgery

## 2024-03-16 ENCOUNTER — Other Ambulatory Visit (HOSPITAL_COMMUNITY): Payer: Self-pay

## 2024-03-16 LAB — CULTURE, BLOOD (ROUTINE X 2)
Culture: NO GROWTH
Culture: NO GROWTH

## 2024-03-16 LAB — GLUCOSE, CAPILLARY
Glucose-Capillary: 259 mg/dL — ABNORMAL HIGH (ref 70–99)
Glucose-Capillary: 229 mg/dL — ABNORMAL HIGH (ref 70–99)
Glucose-Capillary: 240 mg/dL — ABNORMAL HIGH (ref 70–99)

## 2024-03-16 MED ORDER — BASAGLAR KWIKPEN 100 UNIT/ML ~~LOC~~ SOPN
35.0000 [IU] | PEN_INJECTOR | Freq: Every day | SUBCUTANEOUS | 5 refills | Status: DC
  Filled 2024-03-16: qty 12, 30d supply, fill #0

## 2024-03-16 MED ORDER — LANCETS MISC
1.0000 | 10 refills | Status: AC
  Filled 2024-03-16: qty 100, 25d supply, fill #0

## 2024-03-16 MED ORDER — LINEZOLID 600 MG PO TABS
600.0000 mg | ORAL_TABLET | Freq: Two times a day (BID) | ORAL | 0 refills | Status: AC
  Filled 2024-03-16: qty 28, 14d supply, fill #0

## 2024-03-16 MED ORDER — SERTRALINE HCL 25 MG PO TABS
25.0000 mg | ORAL_TABLET | Freq: Every day | ORAL | 2 refills | Status: DC
  Filled 2024-03-16: qty 30, 30d supply, fill #0
  Filled 2024-05-01: qty 30, 30d supply, fill #1

## 2024-03-16 MED ORDER — LINEZOLID 600 MG PO TABS
600.0000 mg | ORAL_TABLET | Freq: Two times a day (BID) | ORAL | 0 refills | Status: DC
  Filled 2024-03-16: qty 24, 12d supply, fill #0

## 2024-03-16 MED ORDER — BLOOD GLUCOSE TEST VI STRP
1.0000 | ORAL_STRIP | 10 refills | Status: AC
  Filled 2024-03-16: qty 100, 25d supply, fill #0

## 2024-03-16 MED ORDER — ACCU-CHEK GUIDE W/DEVICE KIT
1.0000 | PACK | 0 refills | Status: AC
  Filled 2024-03-16: qty 1, 30d supply, fill #0

## 2024-03-16 MED ORDER — INSULIN ASPART 100 UNIT/ML FLEXPEN
0.0000 [IU] | PEN_INJECTOR | Freq: Three times a day (TID) | SUBCUTANEOUS | 5 refills | Status: DC
  Filled 2024-03-16: qty 9, 27d supply, fill #0

## 2024-03-16 MED ORDER — INSULIN PEN NEEDLE 32G X 4 MM MISC
1.0000 | 10 refills | Status: AC
  Filled 2024-03-16: qty 100, 25d supply, fill #0

## 2024-03-16 MED ORDER — LANCET DEVICE MISC
1.0000 | 0 refills | Status: AC
  Filled 2024-03-16: qty 1, fill #0

## 2024-03-16 NOTE — Discharge Summary (Signed)
 Physician Discharge Summary   [name] FMW:[id number] DOB: [birth year] DOA: 03/09/2024  PCP: Vicci Barnie NOVAK, MD  Admit date: 03/09/2024 Discharge date: 03/16/2024  Admitted From: Home Disposition:  Home Discharging physician: Alm Apo, MD Barriers to discharge: none  Recommendations at discharge: Follow-up with ID clinic Referral placed to [facility] wound care Follow-up A1c response and insulin  compliance  Discharge Condition: stable CODE STATUS: Full Diet recommendation:  Diet Orders (From admission, onward)     Start     Ordered   03/16/24 0000  Diet Carb Modified        03/16/24 1134   03/14/24 1126  Diet Carb Modified Fluid consistency: Thin; Room service appropriate? Yes  Diet effective now       Question Answer Comment  Diet-HS Snack? Nothing   Calorie Level Medium 1600-2000   Fluid consistency: Thin   Room service appropriate? Yes      03/14/24 1125            Hospital Course: Patient is a 43 year old male with past medical history significant for diabetes mellitus type 2 and depression.  Patient did not take his medications for the last few months, possibly, related to depressive illness.  Patient could not afford some of the medications also.   Patient presented with fever, chills and left foot ulcer that has been unattended to for about a year.   MRI reported likely osteomyelitis.  Blood cultures growing MSSA.  TTE is nonrevealing.  TEE also performed on 03/14/2024 and negative for vegetations.   Orthopedic surgery was also consulted along with ID.    Assessment & Plan:    Sepsis MSSA bacteremia Left toe ulcer  -Likely secondary to infected left foot ulcer. - Patient has had ulcer for greater than 1 year. - MRI of the foot revealed chronic osteomyelitis.  -Blood culture culture grew MSSA.   -Remains on Ancef ; ID following - Endocarditis workup negative - patient endorsing unable to afford HH at this time; will discuss further with ID  for final abx rec's - patient also adamantly against toe amputation as well - risks of inadequate source control have been explained at length by myself and ID, with no sway in patient decision of declining surgery - Received a dose of oritavancin  on 03/16/2024 followed by a 2-week course of Zyvox with plans for outpatient follow-up with ID - Referral placed to outpatient [facility] care at [facility] per patient request  Uncontrolled diabetes mellitus type 2 -A1c of 12.9% - Patient has been noncompliant - Input from diabetic educator is highly appreciated -Discharged on Basaglar  and NovoLog  -Needs repeat A1c in about 3 months   Acute renal failure - resolved  - Likely prerenal - Resolved with hydration   Syncope - resolved  - Likely from dehydration -Echo was non-revealing   Depression - Psychiatric input is appreciated.   -- Continue sertraline    Cholelithiasis: -Noted on CT, no inflammatory changes    The patient's acute and chronic medical conditions were treated accordingly. On day of discharge, patient was felt deemed stable for discharge. Patient/family member advised to call PCP or come back to ER if needed.   Principal Diagnosis: MSSA bacteremia  Discharge Diagnoses: Active Hospital Problems   Diagnosis Date Noted   MSSA bacteremia 03/12/2024    Priority: 1.   Left foot infection     Priority: 1.   Acute osteomyelitis of left foot (HCC) 03/14/2024   Pressure injury of skin 03/13/2024   Sepsis (HCC) 03/09/2024   Major  depressive disorder, recurrent episode, moderate (HCC) 03/04/2020   Uncontrolled type 2 diabetes mellitus with hyperglycemia (HCC) 05/02/2018    Resolved Hospital Problems  No resolved problems to display.     Discharge Instructions     AMB referral to wound care center   Complete by: As directed    Diet Carb Modified   Complete by: As directed    Discharge wound care:   Complete by: As directed    Cleanse foot and toe wounds with saline, pat dry.  Apply hydrogel to each site, top with dry dressing. Change daily.   Increase activity slowly   Complete by: As directed       Allergies as of 03/16/2024       Reactions   Milk-related Compounds Diarrhea        Medication List     TAKE these medications    Accu-Chek Guide w/Device Kit Use as directed.   Basaglar  KwikPen 100 UNIT/ML Inject 35 Units into the skin daily. May substitute as needed per insurance.   BLOOD GLUCOSE TEST STRIPS Strp Use up to four times daily as directed. (FOR ICD-10 E10.9, E11.9).   hydroxypropyl methylcellulose / hypromellose 2.5 % ophthalmic solution Commonly known as: ISOPTO TEARS / GONIOVISC Place 2 drops into both eyes daily as needed for dry eyes.   ibuprofen 200 MG tablet Commonly known as: ADVIL Take 400 mg by mouth every 6 (six) hours as needed for headache or mild pain (pain score 1-3).   insulin  aspart 100 UNIT/ML FlexPen Commonly known as: NOVOLOG  Inject 0-11 Units into the skin 3 (three) times daily with meals. If eating and Blood Glucose (BG) 80 or higher inject 4 units for meal coverage and add correction dose per scale. If not eating, correction dose only. BG <150= 0 unit; BG 150-200= 1 unit; BG 201-250= 3 unit; BG 251-300= 5 unit; BG 301-350= 7 unit; BG 351-400= 9 unit; BG >400= 11 unit and Call Primary Care.   Insulin  Pen Needle 32G X 4 MM Misc Use up to four times daily as directed.   Lancet Device Misc 1 each by Does not apply route as directed. Dispense based on patient and insurance preference. Use up to four times daily as directed. (FOR ICD-10 E10.9, E11.9).   Lancets Misc Use up to four times daily as directed.   linezolid 600 MG tablet Commonly known as: ZYVOX Take 1 tablet (600 mg total) by mouth 2 (two) times daily for 14 days. Start taking on: March 23, 2024   sertraline  25 MG tablet Commonly known as: ZOLOFT  Take 1 tablet (25 mg total) by mouth daily. Start taking on: March 17, 2024                Discharge Care Instructions  (From admission, onward)           Start     Ordered   03/16/24 0000  Discharge wound care:       Comments: Cleanse foot and toe wounds with saline, pat dry. Apply hydrogel to each site, top with dry dressing. Change daily.   03/16/24 1134            Follow-up Information     [facility]. Schedule an appointment as soon as possible for a visit.   Why: If you need behavioral health services and are new to [facility], simply walk into one of our outpatient services offices or call us  at [phone], Monday through Friday between 8 a.m. and 3 p.m. They  have sliding scale fee for patients without insurance coverage. Contact information: [address]  [address] [place] [address] [phone]         [facility]. Go to.   Why: Utilize the [facility] if needed for assistance with locating outpatient mental health counseling or medication management. Contact information: [address] [place], [place] [address] [phone]        Harden Jerona GAILS, MD Follow up in 1 week(s).   Specialty: Orthopedic Surgery Contact information: [address] [place] [address] [phone]         Harden Jerona GAILS, MD Follow up.   Specialty: Orthopedic Surgery Why: As needed Contact information: [address] [place] [address] [phone]         Vicci Barnie NOVAK, MD. Schedule an appointment as soon as possible for a visit in 2 week(s).   Specialty: Internal Medicine Contact information: [address] [place] [address] [phone]                Allergies  Allergen Reactions   Milk-Related Compounds Diarrhea    Consultations: ID Orthopedic surgery  Procedures:   Discharge Exam: BP 131/87 (BP Location: Left Arm)   Pulse 75   Temp 98.6 F (37 C)   Resp 18   Ht 6' 3 (1.905 m)   Wt (!) 149.7 kg   SpO2 100%   BMI 41.25 kg/m   Physical Exam Constitutional:      General: He is not in acute distress.    Appearance: Normal appearance.  HENT:     Head: Normocephalic and atraumatic.     Mouth/Throat:     Mouth: Mucous membranes are moist.  Eyes:     Extraocular Movements: Extraocular movements intact.  Cardiovascular:     Rate and Rhythm: Normal rate and regular rhythm.  Pulmonary:     Effort: Pulmonary effort is normal. No respiratory distress.     Breath sounds: Normal breath sounds. No wheezing.  Abdominal:     General: Bowel sounds are normal. There is no distension.     Palpations: Abdomen is soft.     Tenderness: There is no abdominal tenderness.  Musculoskeletal:     Cervical back: Normal range of motion and neck supple.     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  Neurological:     General: No focal deficit present.     Mental Status: He is alert.  Psychiatric:        Mood and Affect: Mood normal.        Behavior: Behavior normal.      The results of significant diagnostics from this hospitalization (including imaging, microbiology, ancillary and laboratory) are listed below for reference.   Microbiology: Recent Results (from the past 240 hours)  Resp panel by RT-PCR (RSV, Flu A&B, Covid) Anterior Nasal Swab     Status: None   Collection Time: 03/09/24  3:06 PM   Specimen: Anterior Nasal Swab  Result Value Ref Range Status   SARS Coronavirus 2 by RT PCR NEGATIVE NEGATIVE Final   Influenza A by PCR NEGATIVE NEGATIVE Final   Influenza B by PCR NEGATIVE NEGATIVE Final    Comment: (NOTE) The Xpert Xpress SARS-CoV-2/FLU/RSV plus assay is intended as an aid in the  diagnosis of influenza from Nasopharyngeal swab specimens and should not be used as a sole basis for treatment. Nasal washings and aspirates are unacceptable for Xpert Xpress  SARS-CoV-2/FLU/RSV testing.  Fact Sheet for Patients: BloggerCourse.com  Fact Sheet for Healthcare Providers: SeriousBroker.it  This test is not yet approved or cleared by the [place]  FDA and has been authorized for detection and/or diagnosis of SARS-CoV-2 by FDA under an Emergency Use Authorization (EUA). This EUA will remain in effect (meaning this test can be used) for the duration of the COVID-19 declaration under Section 564(b)(1) of the Act, 21 U.S.C. section 360bbb-3(b)(1), unless the authorization is terminated or revoked.     Resp Syncytial Virus by PCR NEGATIVE NEGATIVE Final    Comment: (NOTE) Fact Sheet for Patients: BloggerCourse.com  Fact Sheet for Healthcare Providers: SeriousBroker.it  This test is not yet approved or cleared by the [place]  FDA and has been authorized for detection and/or diagnosis of SARS-CoV-2 by FDA under an Emergency Use Authorization (EUA). This EUA will remain in effect (meaning this test can be used) for the duration of the COVID-19 declaration under Section 564(b)(1) of the Act, 21 U.S.C. section 360bbb-3(b)(1), unless the authorization is terminated or revoked.  Performed at [facility], [name], [place], [place] [address]   Culture, blood (Routine x 2)     Status: Abnormal   Collection Time: 03/09/24  3:39 PM   Specimen: BLOOD RIGHT HAND  Result Value Ref Range Status   Specimen Description BLOOD RIGHT HAND  Final   Special Requests   Final    BOTTLES DRAWN AEROBIC AND ANAEROBIC Blood Culture results may not be optimal due to an inadequate volume of blood received in culture bottles   Culture  Setup Time   Final    GRAM POSITIVE COCCI AEROBIC BOTTLE ONLY GREG ABBOTT PHARM D 03/11/2024 @ 0219 BY DD Performed at [facility], [address] [place], [place] [address]    Culture STAPHYLOCOCCUS  AUREUS (A)  Final   Report Status 03/13/2024 FINAL  Final   Organism ID, Bacteria STAPHYLOCOCCUS AUREUS  Final      Susceptibility   Staphylococcus aureus - MIC*    CIPROFLOXACIN  <=0.5 SENSITIVE Sensitive     ERYTHROMYCIN <=0.25 SENSITIVE Sensitive     GENTAMICIN <=0.5 SENSITIVE Sensitive     OXACILLIN <=0.25 SENSITIVE Sensitive     TETRACYCLINE <=1 SENSITIVE Sensitive     VANCOMYCIN  1 SENSITIVE Sensitive     TRIMETH /SULFA  <=10 SENSITIVE Sensitive     CLINDAMYCIN <=0.25 SENSITIVE Sensitive     RIFAMPIN <=0.5 SENSITIVE Sensitive     Inducible Clindamycin NEGATIVE Sensitive     LINEZOLID 2 SENSITIVE Sensitive     * STAPHYLOCOCCUS AUREUS  Blood Culture ID Panel (Reflexed)     Status: Abnormal   Collection Time: 03/09/24  3:39 PM  Result Value Ref Range Status   Enterococcus faecalis NOT DETECTED NOT DETECTED Final   Enterococcus Faecium NOT DETECTED NOT DETECTED Final   Listeria monocytogenes NOT DETECTED NOT DETECTED Final   Staphylococcus species DETECTED (A) NOT DETECTED Final    Comment: CRITICAL RESULT CALLED TO, READ BACK BY AND VERIFIED WITH: GREG ABBOTT PHARM D 03/11/2024 @ 0219 BY DD    Staphylococcus aureus (BCID) DETECTED (A) NOT DETECTED Final    Comment: CRITICAL RESULT CALLED TO, READ BACK BY AND VERIFIED WITH: GREG ABBOTT PHARM D 03/11/2024 @ 0219 BY DD    Staphylococcus epidermidis NOT DETECTED NOT DETECTED Final  Staphylococcus lugdunensis NOT DETECTED NOT DETECTED Final   Streptococcus species NOT DETECTED NOT DETECTED Final   Streptococcus agalactiae NOT DETECTED NOT DETECTED Final   Streptococcus pneumoniae NOT DETECTED NOT DETECTED Final   Streptococcus pyogenes NOT DETECTED NOT DETECTED Final   A.calcoaceticus-baumannii NOT DETECTED NOT DETECTED Final   Bacteroides fragilis NOT DETECTED NOT DETECTED Final   Enterobacterales NOT DETECTED NOT DETECTED Final   Enterobacter cloacae complex NOT DETECTED NOT DETECTED Final   Escherichia coli NOT DETECTED NOT  DETECTED Final   Klebsiella aerogenes NOT DETECTED NOT DETECTED Final   Klebsiella oxytoca NOT DETECTED NOT DETECTED Final   Klebsiella pneumoniae NOT DETECTED NOT DETECTED Final   Proteus species NOT DETECTED NOT DETECTED Final   Salmonella species NOT DETECTED NOT DETECTED Final   Serratia marcescens NOT DETECTED NOT DETECTED Final   Haemophilus influenzae NOT DETECTED NOT DETECTED Final   Neisseria meningitidis NOT DETECTED NOT DETECTED Final   Pseudomonas aeruginosa NOT DETECTED NOT DETECTED Final   Stenotrophomonas maltophilia NOT DETECTED NOT DETECTED Final   Candida albicans NOT DETECTED NOT DETECTED Final   Candida auris NOT DETECTED NOT DETECTED Final   Candida glabrata NOT DETECTED NOT DETECTED Final   Candida krusei NOT DETECTED NOT DETECTED Final   Candida parapsilosis NOT DETECTED NOT DETECTED Final   Candida tropicalis NOT DETECTED NOT DETECTED Final   Cryptococcus neoformans/gattii NOT DETECTED NOT DETECTED Final   Meth resistant mecA/C and MREJ NOT DETECTED NOT DETECTED Final    Comment: Performed at [facility], [address], [place], [place] [address]  Culture, blood (Routine x 2)     Status: None   Collection Time: 03/09/24  7:27 PM   Specimen: BLOOD LEFT HAND  Result Value Ref Range Status   Specimen Description BLOOD LEFT HAND  Final   Special Requests   Final    BOTTLES DRAWN AEROBIC ONLY Blood Culture results may not be optimal due to an inadequate volume of blood received in culture bottles   Culture   Final    NO GROWTH 5 DAYS Performed at [facility], [address], [place], [place] [address]    Report Status 03/14/2024 FINAL  Final  Culture, blood (Routine X 2) w Reflex to ID Panel     Status: None   Collection Time: 03/11/24  9:09 AM   Specimen: BLOOD LEFT HAND  Result Value Ref Range Status   Specimen Description BLOOD LEFT HAND  Final   Special Requests   Final    BOTTLES DRAWN AEROBIC AND ANAEROBIC Blood Culture results may not be  optimal due to an inadequate volume of blood received in culture bottles   Culture   Final    NO GROWTH 5 DAYS Performed at [facility], [address], [place], [place] [address]    Report Status 03/16/2024 FINAL  Final  Culture, blood (Routine X 2) w Reflex to ID Panel     Status: None   Collection Time: 03/11/24  9:16 AM   Specimen: BLOOD  Result Value Ref Range Status   Specimen Description BLOOD LEFT ANTECUBITAL  Final   Special Requests   Final    BOTTLES DRAWN AEROBIC AND ANAEROBIC Blood Culture results may not be optimal due to an inadequate volume of blood received in culture bottles   Culture   Final    NO GROWTH 5 DAYS Performed at [facility], [address] [place], [place] [address]    Report  Status 03/16/2024 FINAL  Final     Labs: BNP (last 3 results) No results for input(s): BNP in the last 8760 hours. Basic Metabolic Panel: Recent Labs  Lab 03/09/24 1505 03/09/24 1758 03/09/24 1759 03/10/24 0110 03/11/24 0608 03/13/24 0536  NA 134* 138 139 136 136 136  K 3.9 3.8 3.8 3.7 3.6 3.8  CL 98  --  102 105 103 102  CO2 20*  --   --  20* 20* 26  GLUCOSE 446*  --  427* 340* 231* 238*  BUN 13  --  14 12 8 8   CREATININE 1.25*  --  1.00 0.94 0.76 0.68  CALCIUM  8.7*  --   --  8.3* 8.2* 8.6*  MG  --   --   --   --  1.9 2.0  PHOS  --   --   --   --  2.2* 3.0   Liver Function Tests: Recent Labs  Lab 03/09/24 1505 03/10/24 0110 03/11/24 0608 03/13/24 0536  AST 29 20  --   --   ALT 22 19  --   --   ALKPHOS 106 81  --   --   BILITOT 0.8 1.1  --   --   PROT 7.1 6.1*  --   --   ALBUMIN 3.7 3.0* 2.9* 2.8*   Recent Labs  Lab 03/09/24 1505  LIPASE 20   No results for input(s): AMMONIA in the last 168 hours. CBC: Recent Labs  Lab 03/09/24 1505 03/09/24 1758 03/09/24 1759 03/10/24 0110 03/11/24 0608 03/13/24 0536  WBC 12.1*  --   --  10.0 7.6 7.1  NEUTROABS 10.5*  --   --   --  4.9 3.4  HGB 16.1 14.3 15.0 14.1 13.2 13.2  HCT 47.5  42.0 44.0 43.1 39.3 39.7  MCV 83.9  --   --  86.4 84.2 84.8  PLT 188  --   --  156 146* 209   Cardiac Enzymes: No results for input(s): CKTOTAL, CKMB, CKMBINDEX, TROPONINI in the last 168 hours. BNP: Invalid input(s): POCBNP CBG: Recent Labs  Lab 03/15/24 1204 03/15/24 1601 03/15/24 2127 03/16/24 0610 03/16/24 1201  GLUCAP 204* 282* 259* 229* 240*   D-Dimer No results for input(s): DDIMER in the last 72 hours. Hgb A1c No results for input(s): HGBA1C in the last 72 hours. Lipid Profile No results for input(s): CHOL, HDL, LDLCALC, TRIG, CHOLHDL, LDLDIRECT in the last 72 hours. Thyroid function studies No results for input(s): TSH, T4TOTAL, T3FREE, THYROIDAB in the last 72 hours.  Invalid input(s): FREET3 Anemia work up No results for input(s): VITAMINB12, FOLATE, FERRITIN, TIBC, IRON, RETICCTPCT in the last 72 hours. Urinalysis    Component Value Date/Time   COLORURINE STRAW (A) 03/09/2024 1808   APPEARANCEUR CLEAR 03/09/2024 1808   LABSPEC 1.021 03/09/2024 1808   PHURINE 6.0 03/09/2024 1808   GLUCOSEU >=500 (A) 03/09/2024 1808   HGBUR NEGATIVE 03/09/2024 1808   BILIRUBINUR NEGATIVE 03/09/2024 1808   BILIRUBINUR negative 08/10/2016 1116   KETONESUR 20 (A) 03/09/2024 1808   PROTEINUR NEGATIVE 03/09/2024 1808   UROBILINOGEN 0.2 08/10/2016 1116   NITRITE NEGATIVE 03/09/2024 1808   LEUKOCYTESUR NEGATIVE 03/09/2024 1808   Sepsis Labs Recent Labs  Lab 03/09/24 1505 03/10/24 0110 03/11/24 0608 03/13/24 0536  WBC 12.1* 10.0 7.6 7.1   Microbiology Recent Results (from the past 240 hours)  Resp panel by RT-PCR (RSV, Flu A&B, Covid) Anterior Nasal Swab     Status: None   Collection Time:  03/09/24  3:06 PM   Specimen: Anterior Nasal Swab  Result Value Ref Range Status   SARS Coronavirus 2 by RT PCR NEGATIVE NEGATIVE Final   Influenza A by PCR NEGATIVE NEGATIVE Final   Influenza B by PCR NEGATIVE NEGATIVE Final     Comment: (NOTE) The Xpert Xpress SARS-CoV-2/FLU/RSV plus assay is intended as an aid in the diagnosis of influenza from Nasopharyngeal swab specimens and should not be used as a sole basis for treatment. Nasal washings and aspirates are unacceptable for Xpert Xpress SARS-CoV-2/FLU/RSV testing.  Fact Sheet for Patients: BloggerCourse.com  Fact Sheet for Healthcare Providers: SeriousBroker.it  This test is not yet approved or cleared by the [place]  FDA and has been authorized for detection and/or diagnosis of SARS-CoV-2 by FDA under an Emergency Use Authorization (EUA). This EUA will remain in effect (meaning this test can be used) for the duration of the COVID-19 declaration under Section 564(b)(1) of the Act, 21 U.S.C. section 360bbb-3(b)(1), unless the authorization is terminated or revoked.     Resp Syncytial Virus by PCR NEGATIVE NEGATIVE Final    Comment: (NOTE) Fact Sheet for Patients: BloggerCourse.com  Fact Sheet for Healthcare Providers: SeriousBroker.it  This test is not yet approved or cleared by the [place]  FDA and has been authorized for detection and/or diagnosis of SARS-CoV-2 by FDA under an Emergency Use Authorization (EUA). This EUA will remain in effect (meaning this test can be used) for the duration of the COVID-19 declaration under Section 564(b)(1) of the Act, 21 U.S.C. section 360bbb-3(b)(1), unless the authorization is terminated or revoked.  Performed at [facility], [address] [place], [place] [address]   Culture, blood (Routine x 2)     Status: Abnormal   Collection Time: 03/09/24  3:39 PM   Specimen: BLOOD RIGHT HAND  Result Value Ref Range Status   Specimen Description BLOOD RIGHT HAND  Final   Special Requests   Final    BOTTLES DRAWN AEROBIC AND ANAEROBIC Blood Culture results may not be optimal due to an inadequate  volume of blood received in culture bottles   Culture  Setup Time   Final    GRAM POSITIVE COCCI AEROBIC BOTTLE ONLY GREG ABBOTT PHARM D 03/11/2024 @ 0219 BY DD Performed at [facility], [address], [place], [place] [address]    Culture STAPHYLOCOCCUS AUREUS (A)  Final   Report Status 03/13/2024 FINAL  Final   Organism ID, Bacteria STAPHYLOCOCCUS AUREUS  Final      Susceptibility   Staphylococcus aureus - MIC*    CIPROFLOXACIN  <=0.5 SENSITIVE Sensitive     ERYTHROMYCIN <=0.25 SENSITIVE Sensitive     GENTAMICIN <=0.5 SENSITIVE Sensitive     OXACILLIN <=0.25 SENSITIVE Sensitive     TETRACYCLINE <=1 SENSITIVE Sensitive     VANCOMYCIN  1 SENSITIVE Sensitive     TRIMETH /SULFA  <=10 SENSITIVE Sensitive     CLINDAMYCIN <=0.25 SENSITIVE Sensitive     RIFAMPIN <=0.5 SENSITIVE Sensitive     Inducible Clindamycin NEGATIVE Sensitive     LINEZOLID 2 SENSITIVE Sensitive     * STAPHYLOCOCCUS AUREUS  Blood Culture ID Panel (Reflexed)     Status: Abnormal   Collection Time: 03/09/24  3:39 PM  Result Value Ref Range Status   Enterococcus faecalis NOT DETECTED NOT DETECTED Final   Enterococcus Faecium NOT DETECTED NOT DETECTED Final   Listeria monocytogenes NOT DETECTED NOT DETECTED Final   Staphylococcus species DETECTED (A) NOT DETECTED Final    Comment: CRITICAL  RESULT CALLED TO, READ BACK BY AND VERIFIED WITH: GREG ABBOTT PHARM D 03/11/2024 @ 0219 BY DD    Staphylococcus aureus (BCID) DETECTED (A) NOT DETECTED Final    Comment: CRITICAL RESULT CALLED TO, READ BACK BY AND VERIFIED WITH: GREG ABBOTT PHARM D 03/11/2024 @ 0219 BY DD    Staphylococcus epidermidis NOT DETECTED NOT DETECTED Final   Staphylococcus lugdunensis NOT DETECTED NOT DETECTED Final   Streptococcus species NOT DETECTED NOT DETECTED Final   Streptococcus agalactiae NOT DETECTED NOT DETECTED Final   Streptococcus pneumoniae NOT DETECTED NOT DETECTED Final   Streptococcus pyogenes NOT DETECTED NOT DETECTED Final    A.calcoaceticus-baumannii NOT DETECTED NOT DETECTED Final   Bacteroides fragilis NOT DETECTED NOT DETECTED Final   Enterobacterales NOT DETECTED NOT DETECTED Final   Enterobacter cloacae complex NOT DETECTED NOT DETECTED Final   Escherichia coli NOT DETECTED NOT DETECTED Final   Klebsiella aerogenes NOT DETECTED NOT DETECTED Final   Klebsiella oxytoca NOT DETECTED NOT DETECTED Final   Klebsiella pneumoniae NOT DETECTED NOT DETECTED Final   Proteus species NOT DETECTED NOT DETECTED Final   Salmonella species NOT DETECTED NOT DETECTED Final   Serratia marcescens NOT DETECTED NOT DETECTED Final   Haemophilus influenzae NOT DETECTED NOT DETECTED Final   Neisseria meningitidis NOT DETECTED NOT DETECTED Final   Pseudomonas aeruginosa NOT DETECTED NOT DETECTED Final   Stenotrophomonas maltophilia NOT DETECTED NOT DETECTED Final   Candida albicans NOT DETECTED NOT DETECTED Final   Candida auris NOT DETECTED NOT DETECTED Final   Candida glabrata NOT DETECTED NOT DETECTED Final   Candida krusei NOT DETECTED NOT DETECTED Final   Candida parapsilosis NOT DETECTED NOT DETECTED Final   Candida tropicalis NOT DETECTED NOT DETECTED Final   Cryptococcus neoformans/gattii NOT DETECTED NOT DETECTED Final   Meth resistant mecA/C and MREJ NOT DETECTED NOT DETECTED Final    Comment: Performed at [facility], [address] [place], [place] [address]  Culture, blood (Routine x 2)     Status: None   Collection Time: 03/09/24  7:27 PM   Specimen: BLOOD LEFT HAND  Result Value Ref Range Status   Specimen Description BLOOD LEFT HAND  Final   Special Requests   Final    BOTTLES DRAWN AEROBIC ONLY Blood Culture results may not be optimal due to an inadequate volume of blood received in culture bottles   Culture   Final    NO GROWTH 5 DAYS Performed at [facility], [address], [place], [place] [address]    Report Status 03/14/2024 FINAL  Final  Culture, blood (Routine X 2) w Reflex to ID  Panel     Status: None   Collection Time: 03/11/24  9:09 AM   Specimen: BLOOD LEFT HAND  Result Value Ref Range Status   Specimen Description BLOOD LEFT HAND  Final   Special Requests   Final    BOTTLES DRAWN AEROBIC AND ANAEROBIC Blood Culture results may not be optimal due to an inadequate volume of blood received in culture bottles   Culture   Final    NO GROWTH 5 DAYS Performed at [facility], [address] [place], [place] [address]    Report Status 03/16/2024 FINAL  Final  Culture, blood (Routine X 2) w Reflex to ID Panel     Status: None   Collection Time: 03/11/24  9:16 AM   Specimen: BLOOD  Result Value Ref Range Status   Specimen Description BLOOD LEFT ANTECUBITAL  Final   Special Requests   Final    BOTTLES DRAWN AEROBIC AND ANAEROBIC Blood Culture results may not be optimal due to an inadequate volume of blood received in culture bottles   Culture   Final    NO GROWTH 5 DAYS Performed at [facility], [address], [place], [place] [address]    Report Status 03/16/2024 FINAL  Final    Procedures/Studies: ECHO TEE Result Date: 03/14/2024    TRANSESOPHOGEAL ECHO REPORT   Patient Name:   [name] Date of Exam: 03/14/2024 Medical Rec #:  [id number]           Height:       75.0 in Accession #:    [id number]          Weight:       330.0 lb Date of Birth:  Feb 16, 1981           BSA:          2.715 m Patient Age:    42 years            BP:           133/87 mmHg Patient Gender: M                   HR:           87 bpm. Exam Location:  Inpatient Procedure: Color Doppler, Cardiac Doppler, Transesophageal Echo and Saline            Contrast Bubble Study (Both Spectral and Color Flow Doppler were            utilized during procedure). Indications:     Bacteremia  History:         Patient has prior history of Echocardiogram examinations, most                  recent 03/10/2024.  Sonographer:     Tinnie Gosling RDCS Referring Phys:  [id number] THOM CROME HALEY Diagnosing  Phys: Wilbert Bihari MD PROCEDURE: After discussion of the risks and benefits of a TEE, an informed consent was obtained from the patient. TEE procedure time was 13 minutes. The transesophogeal probe was passed without difficulty through the esophogus of the patient. Imaged were obtained with the patient in a left lateral decubitus position. Sedation performed by different physician. Image quality was excellent. The patient's vital signs; including heart rate, blood pressure, and oxygen  saturation; remained stable throughout the procedure. The patient developed no complications during the procedure.  IMPRESSIONS  1. Left ventricular ejection fraction, by estimation, is 60 to 65%. The left ventricle has normal function. The left ventricle has no regional wall motion abnormalities.  2. Right ventricular systolic function is normal. The right ventricular size is normal.  3. No left atrial/left atrial appendage thrombus was detected. The LAA emptying velocity was 80 cm/s.  4. The mitral valve is normal in structure. Trivial mitral valve regurgitation. No evidence of mitral stenosis.  5. The aortic valve is normal in structure. Aortic valve regurgitation is not visualized. No aortic stenosis is present.  6. The inferior vena cava is normal in size with greater than 50% respiratory variability, suggesting right atrial pressure of 3 mmHg. Conclusion(s)/Recommendation(s): Normal biventricular function without evidence of hemodynamically significant valvular heart disease. No evidence of vegetation/infective endocarditis on this transesophageael echocardiogram. FINDINGS  Left Ventricle: Left ventricular ejection fraction, by estimation, is 60 to 65%. The left ventricle has normal function. The left ventricle has  no regional wall motion abnormalities. The left ventricular internal cavity size was normal in size. There is  no left ventricular hypertrophy. Right Ventricle: The right ventricular size is normal. No increase in  right ventricular wall thickness. Right ventricular systolic function is normal. Left Atrium: Left atrial size was normal in size. No left atrial/left atrial appendage thrombus was detected. The LAA emptying velocity was 80 cm/s. Right Atrium: Right atrial size was normal in size. Pericardium: There is no evidence of pericardial effusion. Mitral Valve: The mitral valve is normal in structure. Trivial mitral valve regurgitation. No evidence of mitral valve stenosis. Tricuspid Valve: The tricuspid valve is normal in structure. Tricuspid valve regurgitation is trivial. No evidence of tricuspid stenosis. Aortic Valve: The aortic valve is normal in structure. Aortic valve regurgitation is not visualized. No aortic stenosis is present. Pulmonic Valve: The pulmonic valve was normal in structure. Pulmonic valve regurgitation is not visualized. No evidence of pulmonic stenosis. Aorta: The aortic root is normal in size and structure. Venous: The inferior vena cava is normal in size with greater than 50% respiratory variability, suggesting right atrial pressure of 3 mmHg. IAS/Shunts: There is redundancy of the interatrial septum. No atrial level shunt detected by color flow Doppler. Agitated saline contrast was given intravenously to evaluate for intracardiac shunting. Wilbert Bihari MD Electronically signed by Wilbert Bihari MD Signature Date/Time: 03/14/2024/3:14:17 PM    Final    EP STUDY Result Date: 03/14/2024 See surgical note for result.  VAS US  ABI WITH/WO TBI Result Date: 03/14/2024  LOWER EXTREMITY DOPPLER STUDY Patient Name:  [name]  Date of Exam:   03/13/2024 Medical Rec #: [id number]            Accession #:    [id number] Date of Birth: [birth year]            Patient Gender: M Patient Age:   64 years Exam Location:  [facility] Procedure:      VAS US  ABI WITH/WO TBI Referring Phys: SYLVESTER OGBATA --------------------------------------------------------------------------------  Indications:  Peripheral artery disease. High Risk Factors: Hypertension, hyperlipidemia, Diabetes, no history of                    smoking.  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 Comparison Study: No prior exam. Performing Technologist: Edilia Elden Appl  Examination Guidelines: A complete evaluation includes at minimum, Doppler waveform signals and systolic blood pressure reading at the level of bilateral brachial, anterior tibial, and posterior tibial arteries, when vessel segments are accessible. Bilateral testing is considered an integral part of a complete examination. Photoelectric Plethysmograph (PPG) waveforms and toe systolic pressure readings are included as required and additional duplex testing as needed. Limited examinations for reoccurring indications may be performed as noted.  ABI Findings: +---------+------------------+-----+---------+----------+ Right    Rt Pressure (mmHg)IndexWaveform Comment    +---------+------------------+-----+---------+----------+ Brachial 141                    triphasic           +---------+------------------+-----+---------+----------+ PTA      161  1.14 triphasic           +---------+------------------+-----+---------+----------+ DP       152               1.08 triphasic           +---------+------------------+-----+---------+----------+ Great Toe177               1.26 Normal   2nd digit. +---------+------------------+-----+---------+----------+ +---------+------------------+-----+---------+----------+ Left     Lt Pressure (mmHg)IndexWaveform Comment    +---------+------------------+-----+---------+----------+ Brachial 132                    triphasic           +---------+------------------+-----+---------+----------+ PTA      182               1.29 triphasic            +---------+------------------+-----+---------+----------+ DP       173               1.23 triphasic           +---------+------------------+-----+---------+----------+ Great Toe180               1.28 Normal   2nd digit. +---------+------------------+-----+---------+----------+ +-------+-----------+-----------+------------+------------+ ABI/TBIToday's ABIToday's TBIPrevious ABIPrevious TBI +-------+-----------+-----------+------------+------------+ Right  1.14       1.26                                +-------+-----------+-----------+------------+------------+ Left   1.29       1.28                                +-------+-----------+-----------+------------+------------+  Summary: Right: Resting right ankle-brachial index is within normal range. The right toe-brachial index is normal.  Left: Resting left ankle-brachial index is within normal range. The left toe-brachial index is normal.  *See table(s) above for measurements and observations.  Electronically signed by Debby Robertson on 03/14/2024 at 10:25:43 AM.    Final    ECHOCARDIOGRAM COMPLETE Result Date: 03/10/2024    ECHOCARDIOGRAM REPORT   Patient Name:   [name] Date of Exam: 03/10/2024 Medical Rec #:  [id number]           Height:       75.0 in Accession #:    [id number]          Weight:       330.0 lb Date of Birth:  [birth year]           BSA:          2.715 m Patient Age:    42 years            BP:           93/56 mmHg Patient Gender: M                   HR:           93 bpm. Exam Location:  Inpatient Procedure: 2D Echo, Cardiac Doppler, Color Doppler and Intracardiac            Opacification Agent (Both Spectral and Color Flow Doppler were            utilized during procedure). Indications:    Syncope R55  History:        Patient has no prior history of Echocardiogram examinations.  Signs/Symptoms:Murmur; Risk Factors:Diabetes, Hypertension,                 Dyslipidemia and Sleep Apnea.   Sonographer:    Koleen Popper RDCS Referring Phys: [id number] REDIA SAILOR [facility]  Sonographer Comments: Patient is obese. Image acquisition challenging due to patient body habitus. IMPRESSIONS  1. Left ventricular ejection fraction, by estimation, is 70 to 75%. The left ventricle has hyperdynamic function. The left ventricle has no regional wall motion abnormalities. There is mild concentric left ventricular hypertrophy. Left ventricular diastolic parameters were normal.  2. Right ventricular systolic function is normal. The right ventricular size is normal. Tricuspid regurgitation signal is inadequate for assessing PA pressure.  3. The mitral valve is normal in structure. No evidence of mitral valve regurgitation. No evidence of mitral stenosis.  4. Possible bicuspid aortic valve. The aortic valve was not well visualized. Unable to determine aortic valve morphology due to image quality. Aortic valve regurgitation is not visualized. Aortic valve sclerosis is present, with no evidence of aortic valve stenosis.  5. The inferior vena cava is dilated in size with >50% respiratory variability, suggesting right atrial pressure of 8 mmHg. Comparison(s): No prior Echocardiogram. FINDINGS  Left Ventricle: Left ventricular ejection fraction, by estimation, is 70 to 75%. The left ventricle has hyperdynamic function. The left ventricle has no regional wall motion abnormalities. Definity contrast agent was given IV to delineate the left ventricular endocardial borders. The left ventricular internal cavity size was normal in size. There is mild concentric left ventricular hypertrophy. Left ventricular diastolic parameters were normal. Right Ventricle: The right ventricular size is normal. No increase in right ventricular wall thickness. Right ventricular systolic function is normal. Tricuspid regurgitation signal is inadequate for assessing PA pressure. Left Atrium: Left atrial size was normal in size. Right Atrium: Grossly normal in  size. Right atrial size was not assessed. Pericardium: There is no evidence of pericardial effusion. Mitral Valve: The mitral valve is normal in structure. No evidence of mitral valve regurgitation. No evidence of mitral valve stenosis. Tricuspid Valve: The tricuspid valve is normal in structure. Tricuspid valve regurgitation is not demonstrated. No evidence of tricuspid stenosis. Aortic Valve: Possible bicuspid aortic valve. The aortic valve was not well visualized. Aortic valve regurgitation is not visualized. Aortic valve sclerosis is present, with no evidence of aortic valve stenosis. Pulmonic Valve: The pulmonic valve was normal in structure. Pulmonic valve regurgitation is not visualized. No evidence of pulmonic stenosis. Aorta: The aortic root and ascending aorta are structurally normal, with no evidence of dilitation. Venous: The inferior vena cava is dilated in size with greater than 50% respiratory variability, suggesting right atrial pressure of 8 mmHg. IAS/Shunts: No atrial level shunt detected by color flow Doppler.  LEFT VENTRICLE PLAX 2D LVIDd:         5.00 cm   Diastology LVIDs:         3.00 cm   LV e' medial:    10.90 cm/s LV PW:         1.10 cm   LV E/e' medial:  8.8 LV IVS:        1.10 cm   LV e' lateral:   10.70 cm/s LVOT diam:     2.25 cm   LV E/e' lateral: 8.9 LV SV:         70 LV SV Index:   26 LVOT Area:     3.98 cm  RIGHT VENTRICLE  IVC RV S prime:     11.60 cm/s  IVC diam: 2.60 cm TAPSE (M-mode): 2.5 cm LEFT ATRIUM             Index LA diam:        4.60 cm 1.69 cm/m LA Vol (A2C):   47.3 ml 17.42 ml/m LA Vol (A4C):   52.1 ml 19.19 ml/m LA Biplane Vol: 55.1 ml 20.30 ml/m  AORTIC VALVE LVOT Vmax:   98.00 cm/s LVOT Vmean:  64.500 cm/s LVOT VTI:    0.175 m  AORTA Ao Root diam: 3.00 cm Ao Asc diam:  3.40 cm MITRAL VALVE MV Area (PHT): 3.83 cm    SHUNTS MV Decel Time: 198 msec    Systemic VTI:  0.18 m MV E velocity: 95.40 cm/s  Systemic Diam: 2.25 cm MV A velocity: 61.80 cm/s MV  E/A ratio:  1.54 Emeline Calender Electronically signed by Emeline Calender Signature Date/Time: 03/10/2024/6:25:07 PM    Final    MR FOOT LEFT WO CONTRAST Result Date: 03/10/2024 CLINICAL DATA:  Soft tissue infection of the left foot EXAM: MRI OF THE LEFT FOOT WITHOUT CONTRAST TECHNIQUE: Multiplanar, multisequence MR imaging of the left foot from the Lisfranc joint through the toes was performed. No intravenous contrast was administered. COMPARISON:  Radiographs 03/09/2024 FINDINGS: Despite efforts by the technologist and patient, motion artifact is present on today's exam and could not be eliminated. This reduces exam sensitivity and specificity. 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 Abnormal flattening of the second metatarsal head potentially from Freiberg's disease or erosive arthropathy, with similar flattening and irregularity along the base of the proximal phalanx second toe. Erosive arthropathy in the head of the fifth metatarsal and in the proximal phalangeal base of the fifth toe in a pattern which can be seen in the setting of psoriatic arthritis, systemic sclerosis, rheumatoid arthritis, or reactive arthritis. Truncated tuft of the distal phalanx second toe with low T1 signal in the distal phalanx, chronic osteomyelitis not excluded. The only unusual feature with regard osteomyelitis is the lack of obvious STIR signal. Truncated soft tissues along the distal-medial tip of the second toe overlying the distal phalanx. Accentuated T2 signal in the distal phalanx third toe for example on image 18 series 7. Although the region is partially obscured by motion artifact, osteomyelitis of the distal phalanx third toe is not excluded. Ligaments Lisfranc ligament intact. Muscles and Tendons Regional muscular atrophy. Soft tissues Plantar ulceration below the fourth MTP joint with cutaneous  thickening and irregularity and underlying subcutaneous edema. Possible cutaneous wound with subcutaneous edema lateral to the fifth MTP joint. IMPRESSION: 1. Plantar ulceration below the fourth MTP joint with cutaneous thickening and irregularity and underlying subcutaneous edema. Possible cutaneous wound with subcutaneous edema lateral to the fifth MTP joint. 2. Truncated tuft of the distal phalanx second toe with low T1 signal in the distal phalanx, chronic osteomyelitis not excluded. The only unusual feature with regard to osteomyelitis is the lack of obvious STIR signal. 3. Accentuated T2 signal in the distal phalanx third toe. Although the region is partially obscured by motion artifact, osteomyelitis of the distal phalanx third toe is not excluded. 4. Erosive arthropathy in the head of the fifth metatarsal and in the proximal phalangeal base of the fifth toe in a pattern which can be seen in the setting of psoriatic arthritis, systemic sclerosis, rheumatoid arthritis, or reactive  arthritis. 5. 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 6. Abnormal flattening of the second metatarsal head potentially from Freiberg's disease or erosive arthropathy, with similar flattening and irregularity along the base of the proximal phalanx second toe. Electronically Signed   By: Ryan Salvage M.D.   On: 03/10/2024 09:00   CT HEAD WO CONTRAST ( ) Result Date: 03/10/2024 EXAM: CT HEAD WITHOUT CONTRAST 03/10/2024 12:07:14 AM TECHNIQUE: CT of the head was performed without the administration of intravenous contrast. Automated exposure control, iterative reconstruction, and/or weight based adjustment of the mA/kV was utilized to reduce the radiation dose to as low as reasonably achievable. COMPARISON: None available. CLINICAL HISTORY: fall. Pt coming from home with bg of 446. Pt non-compliant with his insulin . Pt had a syncopal episode at home witnessed by mother. FINDINGS: BRAIN AND VENTRICLES: No acute  hemorrhage. No evidence of acute infarct. No hydrocephalus. No extra-axial collection. No mass effect or midline shift. ORBITS: No acute abnormality. SINUSES: No acute abnormality. SOFT TISSUES AND SKULL: No acute soft tissue abnormality. No skull fracture. IMPRESSION: 1. No acute intracranial abnormality. Electronically signed by: Pinkie Pebbles MD 03/10/2024 12:12 AM EDT RP Workstation: [id number]   CT CHEST ABDOMEN PELVIS W CONTRAST Result Date: 03/10/2024 EXAM: CT CHEST, ABDOMEN AND PELVIS WITH CONTRAST 03/10/2024 12:07:14 AM TECHNIQUE: CT of the chest, abdomen and pelvis was performed with the administration of 100 mL of iohexol (OMNIPAQUE) 350 MG/ML injection. Multiplanar reformatted images are provided for review. Automated exposure control, iterative reconstruction, and/or weight based adjustment of the mA/kV was utilized to reduce the radiation dose to as low as reasonably achievable. COMPARISON: None available. CLINICAL HISTORY: Abdominal pain, acute, nonlocalized. Pt coming from home with bg of 446. Pt non-compliant with his insulin . Pt had a syncopal episode at home witnessed by mother. FINDINGS: CHEST: MEDIASTINUM AND LYMPH NODES: Heart and pericardium are unremarkable. The central airways are clear. No mediastinal, hilar or axillary lymphadenopathy. LUNGS AND PLEURA: Mild bilateral lower lobe atelectasis. No focal consolidation or pulmonary edema. No pleural effusion or pneumothorax. ABDOMEN AND PELVIS: LIVER: The liver is unremarkable. GALLBLADDER AND BILE DUCTS: Layering gallstones (image 69), without associated inflammatory changes. No biliary ductal dilatation. SPLEEN: No acute abnormality. PANCREAS: No acute abnormality. ADRENAL GLANDS: No acute abnormality. KIDNEYS, URETERS AND BLADDER: No stones in the kidneys or ureters. No hydronephrosis. No perinephric or periureteral stranding. Urinary bladder is unremarkable. GI AND BOWEL: Stomach demonstrates no acute abnormality. Mild to moderate  colonic stool burden. Normal appendix (image 109). There is no bowel obstruction. REPRODUCTIVE ORGANS: The prostate is unremarkable. PERITONEUM AND RETROPERITONEUM: No ascites. No free air. VASCULATURE: Aorta is normal in caliber. ABDOMINAL AND PELVIS LYMPH NODES: No lymphadenopathy. BONES AND SOFT TISSUES: Mild degenerative changes of the thoracolumbar spine. No focal soft tissue abnormality. IMPRESSION: 1. No acute findings. 2. Cholelithiasis, without inflammatory changes. Electronically signed by: Pinkie Pebbles MD 03/10/2024 12:11 AM EDT RP Workstation: [id number]   DG Knee 1-2 Views Right Result Date: 03/09/2024 EXAM: 2 VIEW(S) XRAY OF THE KNEE 03/09/2024 08:49:37 PM COMPARISON: None available. CLINICAL HISTORY: Knee pain FINDINGS: BONES AND JOINTS: No acute fracture. No focal osseous lesion. No joint dislocation. No significant joint effusion. Small patellar spurs at the quadriceps and patellar tendon insertions. Mild degenerative changes in the patellofemoral compartment. SOFT TISSUES: The soft tissues are unremarkable. IMPRESSION: 1. No acute findings. Electronically signed by: Pinkie Pebbles MD 03/09/2024 08:55 PM EDT RP Workstation: [id number]   DG Foot Complete Left Result Date: 03/09/2024 CLINICAL DATA:  sepsis EXAM: LEFT FOOT - COMPLETE 3+ VIEW COMPARISON:  02/18/2023 FINDINGS: Osteopenia.No acute fracture or dislocation. Prior amputation of the great toe. Similar cortical thickening of the second and third metatarsal shaft. Chronic changes of the fifth metatarsal head with osteophyte formation at the PIP joint. Moderate degenerative changes of the second PIP joint. No radiopaque foreign body. No subcutaneous gas. IMPRESSION: Diffuse osteopenia. No subcutaneous gas or radiographic changes of osteomyelitis, at this time. Electronically Signed   By: Rogelia Myers M.D.   On: 03/09/2024 17:31   DG Chest Port 1 View Result Date: 03/09/2024 CLINICAL DATA:  Hyperglycemia. EXAM: PORTABLE CHEST  1 VIEW COMPARISON:  December 10, 2022 FINDINGS: The heart size and mediastinal contours are within normal limits. Low lung volumes are noted with mild areas of atelectasis and/or infiltrate seen within the bilateral lung bases. No pleural effusion or pneumothorax is identified. The visualized skeletal structures are unremarkable. IMPRESSION: Low lung volumes with mild bibasilar atelectasis and/or infiltrate. Electronically Signed   By: Suzen Dials M.D.   On: 03/09/2024 15:52     Time coordinating discharge: Over 30 minutes    Alm Apo, MD  [facility] 03/16/2024, 2:27 PM

## 2024-03-16 NOTE — TOC Progression Note (Signed)
 Transition of Care [facility]) - Progression Note    Patient Details  Name: [name] MRN: [id number] Date of Birth: 05/19/1981  Transition of Care [facility]) CM/SW Contact  Andrez JULIANNA George, RN Phone Number: 03/16/2024, 10:10 AM  Clinical Narrative:     Pt is currently refusing recommended amputation of his toe.  Plan: d/c home on oral antibiotic when medically ready.  IP Care management following.  Expected Discharge Plan: Home/Self Care                 Expected Discharge Plan and Services   Discharge Planning Services: CM Consult   Living arrangements for the past 2 months: Single Family Home                                       Social Drivers of Health (SDOH) Interventions SDOH Screenings   Food Insecurity: No Food Insecurity (03/10/2024)  Housing: Low Risk  (03/10/2024)  Transportation Needs: No Transportation Needs (03/10/2024)  Utilities: Not At Risk (03/10/2024)  Alcohol Screen: Low Risk  (04/06/2023)  Depression (PHQ2-9): Medium Risk (04/06/2023)  Financial Resource Strain: Patient Declined (05/24/2023)   Received from [facility]  Recent Concern: Financial Resource Strain - High Risk (04/06/2023)  Physical Activity: Inactive (04/06/2023)  Social Connections: Socially Isolated (04/06/2023)  Stress: Stress Concern Present (04/06/2023)  Tobacco Use: Low Risk  (03/09/2024)  Health Literacy: Adequate Health Literacy (03/02/2023)    Readmission Risk Interventions     No data to display

## 2024-03-16 NOTE — Plan of Care (Signed)
  Problem: Health Behavior/Discharge Planning: Goal: Ability to manage health-related needs will improve Outcome: Progressing   Problem: Metabolic: Goal: Ability to maintain appropriate glucose levels will improve Outcome: Progressing   Problem: Nutritional: Goal: Progress toward achieving an optimal weight will improve Outcome: Progressing   Problem: Skin Integrity: Goal: Risk for impaired skin integrity will decrease Outcome: Progressing   Problem: Clinical Measurements: Goal: Will remain free from infection Outcome: Progressing   Problem: Skin Integrity: Goal: Risk for impaired skin integrity will decrease Outcome: Progressing

## 2024-03-16 NOTE — Progress Notes (Signed)
 Subjective:  No new complaints    Antibiotics:  Anti-infectives (From admission, onward)    Start     Dose/Rate Route Frequency Ordered Stop   03/23/24 0000  linezolid (ZYVOX) 600 MG tablet  Status:  Discontinued        600 mg Oral 2 times daily 03/16/24 1102 03/16/24    03/23/24 0000  linezolid (ZYVOX) 600 MG tablet        600 mg Oral 2 times daily 03/16/24 1106 04/06/24 2359   03/16/24 0800  Oritavancin  Diphosphate (ORBACTIV ) 1,200 mg in dextrose  5 % IVPB        1,200 mg 333.3 mL/hr over 180 Minutes Intravenous Once 03/15/24 1533     03/11/24 0600  ceFAZolin  (ANCEF ) IVPB 2g/100 mL premix        2 g 200 mL/hr over 30 Minutes Intravenous Every 8 hours 03/11/24 0238 03/15/24 2237   03/10/24 0900  vancomycin  (VANCOREADY) IVPB 1750 mg/350 mL  Status:  Discontinued        1,750 mg 175 mL/hr over 120 Minutes Intravenous Every 12 hours 03/09/24 2257 03/11/24 0238   03/10/24 0500  metroNIDAZOLE  (FLAGYL ) IVPB 500 mg  Status:  Discontinued        500 mg 100 mL/hr over 60 Minutes Intravenous Every 12 hours 03/09/24 2246 03/11/24 0238   03/10/24 0000  ceFEPIme  (MAXIPIME ) 2 g in sodium chloride  0.9 % 100 mL IVPB  Status:  Discontinued        2 g 200 mL/hr over 30 Minutes Intravenous  Once 03/09/24 2246 03/09/24 2257   03/09/24 2300  vancomycin  (VANCOCIN ) IVPB 1000 mg/200 mL premix  Status:  Discontinued        1,000 mg 200 mL/hr over 60 Minutes Intravenous  Once 03/09/24 2246 03/09/24 2248   03/09/24 2256  ceFEPIme  (MAXIPIME ) 2 g in sodium chloride  0.9 % 100 mL IVPB  Status:  Discontinued        2 g 200 mL/hr over 30 Minutes Intravenous Every 8 hours 03/09/24 2257 03/11/24 0238   03/09/24 1515  ceFEPIme  (MAXIPIME ) 2 g in sodium chloride  0.9 % 100 mL IVPB        2 g 200 mL/hr over 30 Minutes Intravenous  Once 03/09/24 1506 03/09/24 1637   03/09/24 1515  metroNIDAZOLE  (FLAGYL ) IVPB 500 mg        500 mg 100 mL/hr over 60 Minutes Intravenous  Once 03/09/24 1506 03/09/24 1800    03/09/24 1515  vancomycin  (VANCOCIN ) IVPB 1000 mg/200 mL premix  Status:  Discontinued        1,000 mg 200 mL/hr over 60 Minutes Intravenous  Once 03/09/24 1506 03/09/24 1511   03/09/24 1515  vancomycin  (VANCOCIN ) 2,500 mg in sodium chloride  0.9 % 500 mL IVPB        2,500 mg 262.5 mL/hr over 120 Minutes Intravenous  Once 03/09/24 1511 03/09/24 2304       Medications: Scheduled Meds:  heparin   5,000 Units Subcutaneous Q8H   insulin  aspart  0-15 Units Subcutaneous TID WC   insulin  aspart  4 Units Subcutaneous TID WC   insulin  glargine-yfgn  30 Units Subcutaneous QHS   sertraline   25 mg Oral Daily   Continuous Infusions:  Oritavancin  Diphosphate (ORBACTIV ) 1,200 mg in dextrose  5 % IVPB 1,200 mg (03/16/24 1119)   PRN Meds:.    Objective: Weight change:   Intake/Output Summary (Last 24 hours) at 03/16/2024 1311 Last data filed at 03/15/2024 2205 Gross per 24  hour  Intake 100 ml  Output --  Net 100 ml   Blood pressure 131/87, pulse 75, temperature 98.6 F (37 C), resp. rate 18, height 6' 3 (1.905 m), weight (!) 149.7 kg, SpO2 100%. Temp:  [97.5 F (36.4 C)-98.6 F (37 C)] 98.6 F (37 C) (10/23 1156) Pulse Rate:  [73-86] 75 (10/23 1156) Resp:  [16-18] 18 (10/23 1156) BP: (116-145)/(69-91) 131/87 (10/23 1156) SpO2:  [97 %-100 %] 100 % (10/23 1156)  Physical Exam: Physical Exam Constitutional:      Appearance: Normal appearance.  HENT:     Head: Normocephalic and atraumatic.  Eyes:     General:        Right eye: No discharge.        Left eye: No discharge.     Extraocular Movements: Extraocular movements intact.     Pupils: Pupils are equal, round, and reactive to light.  Cardiovascular:     Rate and Rhythm: Normal rate and regular rhythm.  Pulmonary:     Effort: No respiratory distress.     Breath sounds: No wheezing.  Abdominal:     General: There is no distension.  Musculoskeletal:        General: Normal range of motion.     Cervical back: Normal range of  motion and neck supple.  Skin:    General: Skin is warm and dry.  Neurological:     General: No focal deficit present.     Mental Status: He is alert and oriented to person, place, and time.  Psychiatric:        Mood and Affect: Mood normal.        Behavior: Behavior normal.        Thought Content: Thought content normal.        Judgment: Judgment normal.      Left lower leg 03/13/2024:           CBC:    BMET No results for input(s): NA, K, CL, CO2, GLUCOSE, BUN, CREATININE, CALCIUM  in the last 72 hours.    Liver Panel  No results for input(s): PROT, ALBUMIN, AST, ALT, ALKPHOS, BILITOT, BILIDIR, IBILI in the last 72 hours.      Sedimentation Rate Recent Labs    03/14/24 0201  ESRSEDRATE 24*   C-Reactive Protein Recent Labs    03/14/24 0201  CRP 1.2*    Micro Results: Recent Results (from the past 720 hours)  Resp panel by RT-PCR (RSV, Flu A&B, Covid) Anterior Nasal Swab     Status: None   Collection Time: 03/09/24  3:06 PM   Specimen: Anterior Nasal Swab  Result Value Ref Range Status   SARS Coronavirus 2 by RT PCR NEGATIVE NEGATIVE Final   Influenza A by PCR NEGATIVE NEGATIVE Final   Influenza B by PCR NEGATIVE NEGATIVE Final    Comment: (NOTE) The Xpert Xpress SARS-CoV-2/FLU/RSV plus assay is intended as an aid in the diagnosis of influenza from Nasopharyngeal swab specimens and should not be used as a sole basis for treatment. Nasal washings and aspirates are unacceptable for Xpert Xpress SARS-CoV-2/FLU/RSV testing.  Fact Sheet for Patients: BloggerCourse.com  Fact Sheet for Healthcare Providers: SeriousBroker.it  This test is not yet approved or cleared by the [place]  FDA and has been authorized for detection and/or diagnosis of SARS-CoV-2 by FDA under an Emergency Use Authorization (EUA). This EUA will remain in effect (meaning this test can be  used) for the duration of the COVID-19 declaration under Section 564(b)(1) of  the Act, 21 U.S.C. section 360bbb-3(b)(1), unless the authorization is terminated or revoked.     Resp Syncytial Virus by PCR NEGATIVE NEGATIVE Final    Comment: (NOTE) Fact Sheet for Patients: BloggerCourse.com  Fact Sheet for Healthcare Providers: SeriousBroker.it  This test is not yet approved or cleared by the [place]  FDA and has been authorized for detection and/or diagnosis of SARS-CoV-2 by FDA under an Emergency Use Authorization (EUA). This EUA will remain in effect (meaning this test can be used) for the duration of the COVID-19 declaration under Section 564(b)(1) of the Act, 21 U.S.C. section 360bbb-3(b)(1), unless the authorization is terminated or revoked.  Performed at [facility], [address], [place], [place] [address]   Culture, blood (Routine x 2)     Status: Abnormal   Collection Time: 03/09/24  3:39 PM   Specimen: BLOOD RIGHT HAND  Result Value Ref Range Status   Specimen Description BLOOD RIGHT HAND  Final   Special Requests   Final    BOTTLES DRAWN AEROBIC AND ANAEROBIC Blood Culture results may not be optimal due to an inadequate volume of blood received in culture bottles   Culture  Setup Time   Final    GRAM POSITIVE COCCI AEROBIC BOTTLE ONLY GREG ABBOTT PHARM D 03/11/2024 @ 0219 BY DD Performed at [facility], [address] [place], [place] [address]    Culture STAPHYLOCOCCUS AUREUS (A)  Final   Report Status 03/13/2024 FINAL  Final   Organism ID, Bacteria STAPHYLOCOCCUS AUREUS  Final      Susceptibility   Staphylococcus aureus - MIC*    CIPROFLOXACIN  <=0.5 SENSITIVE Sensitive     ERYTHROMYCIN <=0.25 SENSITIVE Sensitive     GENTAMICIN <=0.5 SENSITIVE Sensitive     OXACILLIN <=0.25 SENSITIVE Sensitive     TETRACYCLINE <=1 SENSITIVE Sensitive     VANCOMYCIN  1 SENSITIVE Sensitive     TRIMETH /SULFA   <=10 SENSITIVE Sensitive     CLINDAMYCIN <=0.25 SENSITIVE Sensitive     RIFAMPIN <=0.5 SENSITIVE Sensitive     Inducible Clindamycin NEGATIVE Sensitive     LINEZOLID 2 SENSITIVE Sensitive     * STAPHYLOCOCCUS AUREUS  Blood Culture ID Panel (Reflexed)     Status: Abnormal   Collection Time: 03/09/24  3:39 PM  Result Value Ref Range Status   Enterococcus faecalis NOT DETECTED NOT DETECTED Final   Enterococcus Faecium NOT DETECTED NOT DETECTED Final   Listeria monocytogenes NOT DETECTED NOT DETECTED Final   Staphylococcus species DETECTED (A) NOT DETECTED Final    Comment: CRITICAL RESULT CALLED TO, READ BACK BY AND VERIFIED WITH: GREG ABBOTT PHARM D 03/11/2024 @ 0219 BY DD    Staphylococcus aureus (BCID) DETECTED (A) NOT DETECTED Final    Comment: CRITICAL RESULT CALLED TO, READ BACK BY AND VERIFIED WITH: GREG ABBOTT PHARM D 03/11/2024 @ 0219 BY DD    Staphylococcus epidermidis NOT DETECTED NOT DETECTED Final   Staphylococcus lugdunensis NOT DETECTED NOT DETECTED Final   Streptococcus species NOT DETECTED NOT DETECTED Final   Streptococcus agalactiae NOT DETECTED NOT DETECTED Final   Streptococcus pneumoniae NOT DETECTED NOT DETECTED Final   Streptococcus pyogenes NOT DETECTED NOT DETECTED Final   A.calcoaceticus-baumannii NOT DETECTED NOT DETECTED Final   Bacteroides fragilis NOT DETECTED NOT DETECTED Final   Enterobacterales NOT DETECTED NOT DETECTED Final   Enterobacter cloacae complex NOT DETECTED NOT DETECTED Final   Escherichia coli NOT DETECTED NOT DETECTED Final   Klebsiella aerogenes NOT DETECTED NOT DETECTED Final  Klebsiella oxytoca NOT DETECTED NOT DETECTED Final   Klebsiella pneumoniae NOT DETECTED NOT DETECTED Final   Proteus species NOT DETECTED NOT DETECTED Final   Salmonella species NOT DETECTED NOT DETECTED Final   Serratia marcescens NOT DETECTED NOT DETECTED Final   Haemophilus influenzae NOT DETECTED NOT DETECTED Final   Neisseria meningitidis NOT DETECTED  NOT DETECTED Final   Pseudomonas aeruginosa NOT DETECTED NOT DETECTED Final   Stenotrophomonas maltophilia NOT DETECTED NOT DETECTED Final   Candida albicans NOT DETECTED NOT DETECTED Final   Candida auris NOT DETECTED NOT DETECTED Final   Candida glabrata NOT DETECTED NOT DETECTED Final   Candida krusei NOT DETECTED NOT DETECTED Final   Candida parapsilosis NOT DETECTED NOT DETECTED Final   Candida tropicalis NOT DETECTED NOT DETECTED Final   Cryptococcus neoformans/gattii NOT DETECTED NOT DETECTED Final   Meth resistant mecA/C and MREJ NOT DETECTED NOT DETECTED Final    Comment: Performed at [facility], [address] [place], [place] [address]  Culture, blood (Routine x 2)     Status: None   Collection Time: 03/09/24  7:27 PM   Specimen: BLOOD LEFT HAND  Result Value Ref Range Status   Specimen Description BLOOD LEFT HAND  Final   Special Requests   Final    BOTTLES DRAWN AEROBIC ONLY Blood Culture results may not be optimal due to an inadequate volume of blood received in culture bottles   Culture   Final    NO GROWTH 5 DAYS Performed at [facility], [address], [place], [place] [address]    Report Status 03/14/2024 FINAL  Final  Culture, blood (Routine X 2) w Reflex to ID Panel     Status: None   Collection Time: 03/11/24  9:09 AM   Specimen: BLOOD LEFT HAND  Result Value Ref Range Status   Specimen Description BLOOD LEFT HAND  Final   Special Requests   Final    BOTTLES DRAWN AEROBIC AND ANAEROBIC Blood Culture results may not be optimal due to an inadequate volume of blood received in culture bottles   Culture   Final    NO GROWTH 5 DAYS Performed at [facility], [address], [place], [place] [address]    Report Status 03/16/2024 FINAL  Final  Culture, blood (Routine X 2) w Reflex to ID Panel     Status: None   Collection Time: 03/11/24  9:16 AM   Specimen: BLOOD  Result Value Ref Range Status   Specimen Description BLOOD LEFT ANTECUBITAL   Final   Special Requests   Final    BOTTLES DRAWN AEROBIC AND ANAEROBIC Blood Culture results may not be optimal due to an inadequate volume of blood received in culture bottles   Culture   Final    NO GROWTH 5 DAYS Performed at [facility], [address], [place], [place] [address]    Report Status 03/16/2024 FINAL  Final    Studies/Results: No results found.     Assessment/Plan:  INTERVAL HISTORY: pt eager to go home   Principal Problem:   MSSA bacteremia Active Problems:   Uncontrolled type 2 diabetes mellitus with hyperglycemia (HCC)   Major depressive disorder, recurrent episode, moderate (HCC)   Left foot infection   Sepsis (HCC)   Pressure injury of skin   Acute osteomyelitis of left foot (HCC)    [name] is a 43 y.o. 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  I have again explained to him that he really needs this for source control to prevent him from being readmitted for bacteremia and sepsis with risk of spread to the heart valves the next time this  might happen or even potentially death.  There is also certainly risk of spread of the infection in the bones of the feet requiring more morbid surgery down the road  Going to give him a one-time dose of oritavancin  which should last him for 7 days this will be followed by additional zyvox supply with sufficient amount to get to his appointment with our group.   EDU ON has an appointment on  04/03/2024 at 1PM with Dr. Overton at  [facility], which  is located in the [name] at  [address] in [place].  [address], which is located to the left of the elevators.  Phone: [phone]  Fax: [phone]  [email or web address]  The patient should arrive 30 minutes prior to their appoitment.   I personally spent a total of 53 minutes in the care of the patient today including preparing to see the patient, counseling and educating, placing orders, referring and communicating with other health care professionals, documenting clinical information in the EHR, and communicating results.  Evaluation of the patient requires complex antimicrobial therapy evaluation, counseling , isolation needs to reduce disease transmission and risk assessment and mitigation.     LOS: 7 days   Jomarie Fleeta Rothman 03/16/2024, 1:11 PM

## 2024-03-16 NOTE — TOC Transition Note (Signed)
 Transition of Care [facility]) - Discharge Note   Patient Details  Name: [name] MRN: [id number] Date of Birth: May 10, 1981  [facility] CM/SW Contact:  Andrez JULIANNA George, RN Phone Number: 03/16/2024, 12:13 PM   Clinical Narrative:     Pt is discharging home with self care.  CM has provided a MATCH to assist with the cost of his discharge medications. Pt doesn't have health insurance.  Pt has transportation home.  Final next level of care: Home/Self Care Barriers to Discharge: No Barriers Identified   Patient Goals and CMS Choice            Discharge Placement                       Discharge Plan and Services Additional resources added to the After Visit Summary for     Discharge Planning Services: CM Consult                                 Social Drivers of Health (SDOH) Interventions SDOH Screenings   Food Insecurity: No Food Insecurity (03/10/2024)  Housing: Low Risk  (03/10/2024)  Transportation Needs: No Transportation Needs (03/10/2024)  Utilities: Not At Risk (03/10/2024)  Alcohol Screen: Low Risk  (04/06/2023)  Depression (PHQ2-9): Medium Risk (04/06/2023)  Financial Resource Strain: Patient Declined (05/24/2023)   Received from [facility]  Recent Concern: Financial Resource Strain - High Risk (04/06/2023)  Physical Activity: Inactive (04/06/2023)  Social Connections: Socially Isolated (04/06/2023)  Stress: Stress Concern Present (04/06/2023)  Tobacco Use: Low Risk  (03/09/2024)  Health Literacy: Adequate Health Literacy (03/02/2023)     Readmission Risk Interventions     No data to display

## 2024-03-16 NOTE — H&P (Signed)
 [name] is an 43 y.o. male.   Chief Complaint: Redness and swelling left foot third toe with ulcer beneath the fourth metatarsal head.   HPI: [name] is a 43 y.o. male who presents with cellulitis and swelling of the third toe left foot.  Patient is status post left great toe amputation in 2021.  Patient has uncontrolled type 2 diabetes.  Patient states that he felt bad when he was admitted to the hospital and states he feels much better now.  Past Medical History:  Diagnosis Date   ADHD    Anxiety    Bipolar disorder (HCC)    Depression    Diabetes mellitus without complication (HCC)    type 2   Heart murmur    as a child   HLD (hyperlipidemia)    Hypertension    no meds   Peripheral vascular disease     Past Surgical History:  Procedure Laterality Date   AMPUTATION Left 03/06/2020   Procedure: LEFT GREAT TOE AMPUTATION;  Surgeon: Harden Jerona GAILS, MD;  Location: [facility] OR;  Service: Orthopedics;  Laterality: Left;   AMPUTATION Right 10/15/2021   Procedure: RIGHT GREAT TOE AMPUTATION;  Surgeon: Harden Jerona GAILS, MD;  Location: [facility] OR;  Service: Orthopedics;  Laterality: Right;   BONE BIOPSY Left 02/25/2023   Procedure: LEFT FIFTH METATARSAL BONE BIOPSY;  Surgeon: Lennie Barter, DPM;  Location: [facility] ORS;  Service: Podiatry;  Laterality: Left;   EYE SURGERY Bilateral    lasik   TRANSESOPHAGEAL ECHOCARDIOGRAM (CATH LAB) N/A 03/14/2024   Procedure: TRANSESOPHAGEAL ECHOCARDIOGRAM;  Surgeon: Shlomo Wilbert SAUNDERS, MD;  [facility]: [facility] INVAS[facility];  Service: Cardiovascular;  Laterality: N/A;   TRANSMETATARSAL AMPUTATION Right 02/25/2023   Procedure: RIGHT PARTIAL FIRST RAY AMPUTATION WITH ANTIBIOTIC BEAD APPLICATION;  Surgeon: Lennie Barter, DPM;  Location: [facility] ORS;  Service: Podiatry;  Laterality: Right;   WISDOM TOOTH EXTRACTION      Family History  Problem Relation Age of Onset   Diabetes Mother    Hyperlipidemia Father    Hypertension Father    Social History:   reports that he has never smoked. He has never used smokeless tobacco. He reports that he does not currently use alcohol. He reports that he does not use drugs.  Allergies:  Allergies  Allergen Reactions   Milk-Related Compounds Diarrhea    No medications prior to admission.    Results for orders placed or performed during the hospital encounter of 03/09/24 (from the past 48 hours)  Glucose, capillary     Status: Abnormal   Collection Time: 03/14/24  9:15 PM  Result Value Ref Range   Glucose-Capillary 265 (H) 70 - 99 mg/dL    Comment: Glucose reference range applies only to samples taken after fasting for at least 8 hours.   Comment 1 Notify RN   Glucose, capillary     Status: Abnormal   Collection Time: 03/15/24  6:09 AM  Result Value Ref Range   Glucose-Capillary 221 (H) 70 - 99 mg/dL    Comment: Glucose reference range applies only to samples taken after fasting for at least 8 hours.   Comment 1 Notify RN   Glucose, capillary     Status: Abnormal   Collection Time: 03/15/24 12:04 PM  Result Value Ref Range   Glucose-Capillary 204 (H) 70 - 99 mg/dL    Comment: Glucose reference range applies only to samples taken after fasting for at least 8 hours.   Comment 1 Notify RN  Glucose, capillary     Status: Abnormal   Collection Time: 03/15/24  4:01 PM  Result Value Ref Range   Glucose-Capillary 282 (H) 70 - 99 mg/dL    Comment: Glucose reference range applies only to samples taken after fasting for at least 8 hours.   Comment 1 Notify RN   Glucose, capillary     Status: Abnormal   Collection Time: 03/15/24  9:27 PM  Result Value Ref Range   Glucose-Capillary 259 (H) 70 - 99 mg/dL    Comment: Glucose reference range applies only to samples taken after fasting for at least 8 hours.   Comment 1 Notify RN    Comment 2 Document in Chart   Glucose, capillary     Status: Abnormal   Collection Time: 03/16/24  6:10 AM  Result Value Ref Range   Glucose-Capillary 229 (H) 70 - 99  mg/dL    Comment: Glucose reference range applies only to samples taken after fasting for at least 8 hours.   Comment 1 Notify RN   Glucose, capillary     Status: Abnormal   Collection Time: 03/16/24 12:01 PM  Result Value Ref Range   Glucose-Capillary 240 (H) 70 - 99 mg/dL    Comment: Glucose reference range applies only to samples taken after fasting for at least 8 hours.   Comment 1 Notify RN    Comment 2 Document in Chart    No results found.  Review of Systems  All other systems reviewed and are negative.   There were no vitals taken for this visit. Physical Exam  General: Alert, no acute distress Psychiatric: Patient is competent for consent with normal mood and affect Lymphatic: No axillary or cervical lymphadenopathy Cardiovascular: No pedal edema Respiratory: No cyanosis, no use of accessory musculature GI: No organomegaly, abdomen is soft and non-tender Skin: Examination patient has sausage digit swelling of the third toe left foot.  There is no swelling of the 2nd, 4th or 5th toes.  There is a callused ulcer over the tip of the third toe.  There is no cellulitis proximal to the PIP joint.   Patient has a strong palpable dorsalis pedis pulse.   Review the MRI scan does show edema in the tuft of the third toe consistent with osteomyelitis.  There is no abscess.  There is no signs of infection throughout the remainder of the left foot.   Patient has a Wagner grade 1 ulcer beneath the fourth metatarsal head that is 1 cm diameter with granulation tissue at the base.   Review of the medical values show a white cell count of 7.1 with a hemoglobin A1c of 12.9.  Assessment Assessment: Cellulitis and osteomyelitis third toe tuft.  Uncontrolled type 2 diabetes with inline arterial flow to the ankle.   Plan: Plan: Discussed with the patient that he can be followed as an outpatient for the infection of the tuft of the third toe.  Discussed that he can follow-up with me or the  wound center in [place].  Patient states the [place] is closer and that he will most likely follow-up in [place].  I will follow-up as needed.  Recommended that patient will require an amputation of the third toe.  Maurilio Deland Collet, PA-C 03/16/2024, 5:48 PM

## 2024-03-16 NOTE — Inpatient Diabetes Management (Signed)
 Inpatient Diabetes Program Recommendations  AACE/ADA: New Consensus Statement on Inpatient Glycemic Control (2015)  Target Ranges:  Prepandial:   less than 140 mg/dL      Peak postprandial:   less than 180 mg/dL (1-2 hours)      Critically ill patients:  140 - 180 mg/dL   Lab Results  Component Value Date   GLUCAP 229 (H) 03/16/2024   HGBA1C 12.9 (H) 03/10/2024    Review of Glycemic Control  Latest Reference Range & Units 03/15/24 06:09 03/15/24 12:04 03/15/24 16:01 03/15/24 21:27 03/16/24 06:10  Glucose-Capillary 70 - 99 mg/dL 778 (H) 795 (H) 717 (H) 259 (H) 229 (H)  (H): Data is abnormally high  Diabetes history: DM 2 Outpatient Diabetes medications: Basaglar  60 units qhs, Novolog  5 units tid, Metformin  2000 mg Daily Current orders for Inpatient glycemic control:  Semglee  30 units qhs Novolog  0-15 units tid  Novolog  4 units tid meal coverage   Inpatient Diabetes Program Recommendations:     Refusing amputation  Please consider:  Semglee  40 units QHS Novolog  6 units TID with meals  Thank you, Wyvonna Pinal, MSN, CDCES Diabetes Coordinator Inpatient Diabetes Program [phone] (team pager from 8a-5p)

## 2024-03-16 NOTE — Progress Notes (Addendum)
 Patient stated the he was not having surgery tomorrow and that he was going follow up with wound care at [facility].    Dr. Harden and OR desk made aware.

## 2024-03-16 NOTE — Progress Notes (Signed)
 Anesthesia Chart Review: SAME DAY WORK-UP  Case: [id number] Date/Time: 03/17/24 1600   Procedure: AMPUTATION, TOE (Left: Toe) - LEFT 3RD TOE AMPUTATION   Anesthesia type: Choice   Diagnosis: Osteomyelitis of third toe of left foot (HCC) [M86.9]   Pre-op diagnosis: Osteomyelitis Left 3rd Toe   [facility]   Surgeons: Harden Jerona GAILS, MD       DISCUSSION: Patient is a 43 year old male scheduled for the above procedure. UPDATE: Surgery is now cancelled. He declined toe amputation.  Referred to outpatient wound care at [facility].   He was just hospitalized 03/09/2024 -03/16/2024 after presenting to ED per EMS with glucose > 400 (off meds), witnessed syncope, fever, chills, vomiting, with left toe ulcer.  HR 130's, Temp 102.9. He was diagnosed with sepsis/MSSA bacteremia with source attributed to his left toe ulceration.  MRI revealed chronic osteomyelitis.  On Ancef  per ID.  03/14/2024 TTE showed normal biventricular function without evidence of hemodynamically significant valvular heart disease, no evidence of vegetation/infective endocarditis.  Left toe amputation recommended.  A1c 12.9% and discharged on Basaglar  and NovoLog .  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   VS:  Wt Readings from Last 3 Encounters:  03/09/24 (!) 149.7 kg  04/06/23 (!) 143.3 kg  02/25/23 (!) 145.2 kg   Temp Readings from Last 3 Encounters:  03/16/24 37 C  04/06/23 36.7 C (Oral)  02/28/23 36.6 C (Oral)   BP Readings from Last 3 Encounters:  03/16/24 131/87  04/06/23 124/80  02/28/23 131/76   Pulse Readings from Last 3 Encounters:  03/16/24 75  04/06/23 95  02/28/23 85     PROVIDERS: Vicci Barnie NOVAK, MD is PCP    LABS: Most recent lab results in [facility] include: Lab Results  Component Value Date   WBC 7.1 03/13/2024   HGB 13.2 03/13/2024   HCT  39.7 03/13/2024   PLT 209 03/13/2024   GLUCOSE 238 (H) 03/13/2024   CHOL 212 (H) 12/31/2022   TRIG 216 (H) 12/31/2022   HDL 35 (L) 12/31/2022   LDLCALC 138 (H) 12/31/2022   ALT 19 03/10/2024   AST 20 03/10/2024   NA 136 03/13/2024   K 3.8 03/13/2024   CL 102 03/13/2024   CREATININE 0.68 03/13/2024   BUN 8 03/13/2024   CO2 26 03/13/2024   TSH 0.956 03/10/2024   INR 1.1 03/09/2024   HGBA1C 12.9 (H) 03/10/2024     IMAGES: MRI Left Foot 03/10/2024: IMPRESSION: 1. Plantar ulceration below the fourth MTP joint with cutaneous thickening and irregularity and underlying subcutaneous edema. Possible cutaneous wound with subcutaneous edema lateral to the fifth MTP joint. 2. Truncated tuft of the distal phalanx second toe with low T1 signal in the distal phalanx, chronic osteomyelitis not excluded. The only unusual feature with regard to osteomyelitis is the lack of obvious STIR signal. 3. Accentuated T2 signal in the distal phalanx third toe. Although the region is partially obscured by motion artifact, osteomyelitis of the distal phalanx third toe is not excluded. 4. Erosive arthropathy in the head of the fifth metatarsal and in the proximal phalangeal base of the fifth toe in a pattern which can be seen in the setting of psoriatic arthritis, systemic sclerosis, rheumatoid arthritis, or reactive arthritis. 5. 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 6. Abnormal flattening of the second metatarsal head potentially from Freiberg's disease  or erosive arthropathy, with similar flattening and irregularity along the base of the proximal phalanx second toe.  CT Chest/Abd/Pelvis 03/10/2024: IMPRESSION: 1. No acute findings. 2. Cholelithiasis, without inflammatory changes.  CT Head 03/10/2024: MPRESSION: 1. No acute intracranial abnormality.   EKG: 03/10/2024: Sinus tachycardia at 113 bpm   CV: TEE 03/14/2024: IMPRESSIONS   1. Left ventricular ejection fraction,  by estimation, is 60 to 65%. The  left ventricle has normal function. The left ventricle has no regional  wall motion abnormalities.   2. Right ventricular systolic function is normal. The right ventricular  size is normal.   3. No left atrial/left atrial appendage thrombus was detected. The LAA  emptying velocity was 80 cm/s.   4. The mitral valve is normal in structure. Trivial mitral valve  regurgitation. No evidence of mitral stenosis.   5. The aortic valve is normal in structure. Aortic valve regurgitation is  not visualized. No aortic stenosis is present.   6. The inferior vena cava is normal in size with greater than 50%  respiratory variability, suggesting right atrial pressure of 3 mmHg.  - Conclusion(s)/Recommendation(s): Normal biventricular function without  evidence of hemodynamically significant valvular heart disease. No  evidence of vegetation/infective endocarditis on this transesophageael  echocardiogram.   TTE 03/10/2024: IMPRESSIONS   1. Left ventricular ejection fraction, by estimation, is 70 to 75%. The  left ventricle has hyperdynamic function. The left ventricle has no  regional wall motion abnormalities. There is mild concentric left  ventricular hypertrophy. Left ventricular  diastolic parameters were normal.   2. Right ventricular systolic function is normal. The right ventricular  size is normal. Tricuspid regurgitation signal is inadequate for assessing  PA pressure.   3. The mitral valve is normal in structure. No evidence of mitral valve  regurgitation. No evidence of mitral stenosis.   4. Possible bicuspid aortic valve. The aortic valve was not well  visualized. Unable to determine aortic valve morphology due to image  quality. Aortic valve regurgitation is not visualized. Aortic valve  sclerosis is present, with no evidence of aortic  valve stenosis.   5. The inferior vena cava is dilated in size with >50% respiratory  variability, suggesting right  atrial pressure of 8 mmHg.    Past Medical History:  Diagnosis Date   ADHD    Anxiety    Bipolar disorder (HCC)    Depression    Diabetes mellitus without complication (HCC)    type 2   Heart murmur    as a child   HLD (hyperlipidemia)    Hypertension    no meds   Peripheral vascular disease     Past Surgical History:  Procedure Laterality Date   AMPUTATION Left 03/06/2020   Procedure: LEFT GREAT TOE AMPUTATION;  Surgeon: Harden Jerona GAILS, MD;  Location: [facility] OR;  Service: Orthopedics;  Laterality: Left;   AMPUTATION Right 10/15/2021   Procedure: RIGHT GREAT TOE AMPUTATION;  Surgeon: Harden Jerona GAILS, MD;  Location: [facility] OR;  Service: Orthopedics;  Laterality: Right;   BONE BIOPSY Left 02/25/2023   Procedure: LEFT FIFTH METATARSAL BONE BIOPSY;  Surgeon: Lennie Barter, DPM;  Location: [facility] ORS;  Service: Podiatry;  Laterality: Left;   EYE SURGERY Bilateral    lasik   TRANSESOPHAGEAL ECHOCARDIOGRAM (CATH LAB) N/A 03/14/2024   Procedure: TRANSESOPHAGEAL ECHOCARDIOGRAM;  Surgeon: Shlomo Wilbert SAUNDERS, MD;  [facility];  Service: Cardiovascular;  Laterality: N/A;   TRANSMETATARSAL AMPUTATION Right 02/25/2023   Procedure: RIGHT PARTIAL FIRST RAY AMPUTATION  WITH ANTIBIOTIC BEAD APPLICATION;  Surgeon: Lennie Barter, DPM;  Location: [facility] ORS;  Service: Podiatry;  Laterality: Right;   WISDOM TOOTH EXTRACTION      MEDICATIONS: No current facility-administered medications for this encounter.    Blood Glucose Monitoring Suppl (ACCU-CHEK GUIDE) w/Device KIT   Glucose Blood (BLOOD GLUCOSE TEST STRIPS) STRP   hydroxypropyl methylcellulose / hypromellose (ISOPTO TEARS / GONIOVISC) 2.5 % ophthalmic solution   ibuprofen (ADVIL) 200 MG tablet   insulin  aspart (NOVOLOG ) 100 UNIT/ML FlexPen   Insulin  Glargine (BASAGLAR  KWIKPEN) 100 UNIT/ML   Insulin  Pen Needle 32G X 4 MM MISC   Lancet Device MISC   Lancets MISC   [START ON 03/23/2024] linezolid (ZYVOX) 600 MG tablet   [START ON  03/17/2024] sertraline  (ZOLOFT ) 25 MG tablet    heparin  injection 5,000 Units   insulin  aspart (novoLOG ) injection 0-15 Units   insulin  aspart (novoLOG ) injection 4 Units   insulin  glargine-yfgn (SEMGLEE ) injection 30 Units   sertraline  (ZOLOFT ) tablet 25 mg    Delesha Pohlman, PA-C Surgical Short Stay/Anesthesiology [facility] Phone [phone] [facility] Phone [phone] 03/16/2024 6:18 PM

## 2024-03-17 ENCOUNTER — Telehealth: Payer: Self-pay | Admitting: *Deleted

## 2024-03-17 ENCOUNTER — Inpatient Hospital Stay (HOSPITAL_COMMUNITY): Admission: RE | Admit: 2024-03-17 | Payer: MEDICAID | Source: Home / Self Care | Admitting: Orthopedic Surgery

## 2024-03-17 ENCOUNTER — Encounter (HOSPITAL_COMMUNITY): Admission: RE | Payer: Self-pay | Source: Home / Self Care

## 2024-03-17 SURGERY — AMPUTATION, TOE
Anesthesia: Choice | Site: Toe | Laterality: Left

## 2024-03-17 NOTE — Transitions of Care (Post Inpatient/ED Visit) (Signed)
 03/17/2024  Name: [name] MRN: [id number] DOB: 1981-03-30  Today's TOC FU Call Status: Today's TOC FU Call Status:: Successful TOC FU Call Completed TOC FU Call Complete Date: 03/17/24 Patient's Name and Date of Birth confirmed.  Transition Care Management Follow-up Telephone Call Date of Discharge: 03/16/24 Discharge Facility: [facility]) Type of Discharge: Inpatient Admission Primary Inpatient Discharge Diagnosis:: MSSA bacteremia with Acute osteomyelitis of left foot How have you been since you were released from the hospital?: Better Any questions or concerns?: No  Items Reviewed: Did you receive and understand the discharge instructions provided?: Yes Medications obtained,verified, and reconciled?: Yes (Medications Reviewed) Any new allergies since your discharge?: No Dietary orders reviewed?: Yes Type of Diet Ordered:: Carb Modified Do you have support at home?: Yes People in Home [RPT]: parent(s) Name of Support/Comfort Primary Source: Mother/Kathy and Father/Shanan  Medications Reviewed Today: Medications Reviewed Today     Reviewed by Lucky Andrea LABOR, RN (Registered Nurse) on 03/17/24 at [phone]  Med List Status: <None>   Medication Order Taking? Sig Documenting Provider Last Dose Status Informant  Blood Glucose Monitoring Suppl (ACCU-CHEK GUIDE) w/Device KIT [id number] Yes Use as directed. Patsy Lenis, MD  Active   Glucose Blood (BLOOD GLUCOSE TEST STRIPS) STRP [id number] Yes Use up to four times daily as directed. (FOR ICD-10 E10.9, E11.9). Patsy Lenis, MD  Active   hydroxypropyl methylcellulose / hypromellose (ISOPTO TEARS / GONIOVISC) 2.5 % ophthalmic solution [id number] Yes Place 2 drops into both eyes daily as needed for dry eyes. [provider]  Active Self  ibuprofen (ADVIL) 200 MG tablet [id number] Yes Take 400 mg by mouth every 6 (six) hours as needed for headache or mild pain (pain score 1-3). [provider]  Active Self   insulin  aspart (NOVOLOG ) 100 UNIT/ML FlexPen [id number] Yes Inject 0-11 Units into the skin 3 (three) times daily with meals. If eating and Blood Glucose (BG) 80 or higher inject 4 units for meal coverage and add correction dose per scale. If not eating, correction dose only. BG <150= 0 unit; BG 150-200= 1 unit; BG 201-250= 3 unit; BG 251-300= 5 unit; BG 301-350= 7 unit; BG 351-400= 9 unit; BG >400= 11 unit and Call Primary Care. Patsy Lenis, MD  Active   Insulin  Glargine (BASAGLAR  [facility]) 100 UNIT/ML [id number] Yes Inject 35 Units into the skin daily. May substitute as needed per insurance. Patsy Lenis, MD  Active   Insulin  Pen Needle 32G X 4 MM MISC [id number] Yes Use up to four times daily as directed. Patsy Lenis, MD  Active   Lancet Device MISC [id number] Yes 1 each by Does not apply route as directed. Dispense based on patient and insurance preference. Use up to four times daily as directed. (FOR ICD-10 E10.9, E11.9). Patsy Lenis, MD  Active   Lancets MISC [id number] Yes Use up to four times daily as directed. Patsy Lenis, MD  Active   linezolid (ZYVOX) 600 MG tablet [id number]  Take 1 tablet (600 mg total) by mouth 2 (two) times daily for 14 days.  Patient not taking: Reported on 03/17/2024   Fleeta Rothman, Jomarie SAILOR, MD  Active   sertraline  (ZOLOFT ) 25 MG tablet [id number] Yes Take 1 tablet (25 mg total) by mouth daily. Patsy Lenis, MD  Active             Home Care and Equipment/Supplies: Were Home Health Services Ordered?: No Any new equipment or medical supplies ordered?: No  Functional Questionnaire: Do you need assistance with  bathing/showering or dressing?: No Do you need assistance with meal preparation?: No Do you need assistance with eating?: No Do you have difficulty maintaining continence: No Do you need assistance with getting out of bed/getting out of a chair/moving?: No Do you have difficulty managing or taking your medications?: No  Follow up appointments  reviewed: PCP Follow-up appointment confirmed?: No (RNCM assisted with scheduling PCP hospital follow up. Pt preferred to see Dr. Vicci, first available appointment that would work for pt was 04/07/24 at 9:10am.) MD Provider Line [phone] Given: No Specialist Hospital Follow-up appointment confirmed?: Yes Date of Specialist follow-up appointment?: 04/03/24 Follow-Up Specialty Provider:: [facility] Do you need transportation to your follow-up appointment?: No Do you understand care options if your condition(s) worsen?: Yes-patient verbalized understanding  SDOH Interventions Today    Flowsheet Row Most Recent Value  SDOH Interventions   Food Insecurity Interventions Intervention Not Indicated  Housing Interventions Intervention Not Indicated  Transportation Interventions Intervention Not Indicated  Utilities Interventions Intervention Not Indicated    Andrea Dimes RN, BSN [place]  [facility] RN Care Manager [phone]

## 2024-03-28 ENCOUNTER — Encounter: Payer: Self-pay | Attending: Physician Assistant | Admitting: Physician Assistant

## 2024-03-28 DIAGNOSIS — E1142 Type 2 diabetes mellitus with diabetic polyneuropathy: Secondary | ICD-10-CM | POA: Insufficient documentation

## 2024-03-28 DIAGNOSIS — L97522 Non-pressure chronic ulcer of other part of left foot with fat layer exposed: Secondary | ICD-10-CM | POA: Insufficient documentation

## 2024-03-28 DIAGNOSIS — E1169 Type 2 diabetes mellitus with other specified complication: Secondary | ICD-10-CM | POA: Insufficient documentation

## 2024-03-28 DIAGNOSIS — E11621 Type 2 diabetes mellitus with foot ulcer: Secondary | ICD-10-CM | POA: Insufficient documentation

## 2024-03-28 DIAGNOSIS — M86672 Other chronic osteomyelitis, left ankle and foot: Secondary | ICD-10-CM | POA: Insufficient documentation

## 2024-04-03 ENCOUNTER — Ambulatory Visit: Payer: Self-pay | Admitting: Internal Medicine

## 2024-04-04 ENCOUNTER — Encounter: Admitting: Physician Assistant

## 2024-04-06 ENCOUNTER — Ambulatory Visit: Admitting: Internal Medicine

## 2024-04-06 ENCOUNTER — Other Ambulatory Visit: Payer: Self-pay

## 2024-04-06 ENCOUNTER — Telehealth: Payer: Self-pay | Admitting: Internal Medicine

## 2024-04-06 ENCOUNTER — Encounter: Payer: Self-pay | Admitting: Internal Medicine

## 2024-04-06 VITALS — BP 120/78 | HR 96 | Temp 98.0°F | Resp 16 | Wt 296.8 lb

## 2024-04-06 DIAGNOSIS — L089 Local infection of the skin and subcutaneous tissue, unspecified: Secondary | ICD-10-CM

## 2024-04-06 DIAGNOSIS — E1065 Type 1 diabetes mellitus with hyperglycemia: Secondary | ICD-10-CM

## 2024-04-06 DIAGNOSIS — R7881 Bacteremia: Secondary | ICD-10-CM | POA: Diagnosis present

## 2024-04-06 DIAGNOSIS — B9561 Methicillin susceptible Staphylococcus aureus infection as the cause of diseases classified elsewhere: Secondary | ICD-10-CM

## 2024-04-06 DIAGNOSIS — E10621 Type 1 diabetes mellitus with foot ulcer: Secondary | ICD-10-CM | POA: Diagnosis not present

## 2024-04-06 DIAGNOSIS — E11628 Type 2 diabetes mellitus with other skin complications: Secondary | ICD-10-CM

## 2024-04-06 NOTE — Patient Instructions (Addendum)
 Finish no more than this week of linezolid   See us  in 4 weeks for post antibiotics check to determine cure    Follow up with your primary care on diabetes control      Blood tests today

## 2024-04-06 NOTE — Telephone Encounter (Signed)
Confirmed appt for 11/14

## 2024-04-06 NOTE — Progress Notes (Signed)
 [facility]  Patient Active Problem List   Diagnosis Date Noted   Acute osteomyelitis of left foot (HCC) 03/14/2024   Pressure injury of skin 03/13/2024   MSSA bacteremia 03/12/2024   Sepsis (HCC) 03/09/2024   Diabetic foot infection (HCC) 02/26/2023   MDD (major depressive disorder), recurrent, in partial remission 11/19/2021   Left foot infection    Bipolar depression (HCC) 09/10/2021   Adjustment disorder with mixed anxiety and depressed mood 09/10/2021   Attention deficit hyperactivity disorder (ADHD), predominantly inattentive type 07/30/2020   Anxiety with depression 06/07/2020   Hyperlipidemia due to type 2 diabetes mellitus (HCC) 03/27/2020   Status post amputation of left great toe 03/26/2020   Influenza vaccination declined 03/26/2020   23-polyvalent pneumococcal polysaccharide vaccine declined 03/26/2020   COVID-19 vaccine series completed 03/26/2020   Moderate major depression, single episode (HCC) 03/26/2020   Anaerobic bacteremia 03/07/2020   Major depressive disorder, recurrent episode, moderate (HCC) 03/04/2020   Abscess of great toe, left    Sepsis due to group B Streptococcus with acute renal failure (HCC)    Cellulitis of left toe 03/03/2020   Diabetic ketoacidosis (HCC) 03/03/2020   DKA (diabetic ketoacidosis) (HCC) 03/03/2020   Ulcer of right foot with fat layer exposed (HCC) 06/06/2019   Non-pressure chronic ulcer of other part of left foot limited to breakdown of skin (HCC) 06/06/2019   Uncontrolled type 2 diabetes mellitus with hyperglycemia (HCC) 05/02/2018   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) 07/30/2017   Venous insufficiency of both lower extremities 07/30/2017   Obesity, Class III, BMI 40-49.9 (morbid obesity) (HCC) 07/30/2017   Essential hypertension 09/03/2016      Subjective:    Patient ID: [name] Rosalva Jalene Mickey., male    DOB: 1981-05-19, 43 y.o.   MRN: [id number]  Chief Complaint  Patient  presents with   New Patient (Initial Visit)    MSSA     HPI:  [name] Tessler. is a 43 y.o. male dm2, hx foot infection, here for hospital f/u mssa bsi and diabetic foot infection  Reviewed chart Patient seeing wound care with improving ulcer size Finishing linezolid No visual change, tingling, numbness Slight muffle of both ears No sorethroat/congestion He does have runny nose all the time He gets intermittent lightheadedness; his sugar is more than 200s   Allergies  Allergen Reactions   Milk-Related Compounds Diarrhea      Outpatient Medications Prior to Visit  Medication Sig Dispense Refill   Blood Glucose Monitoring Suppl (ACCU-CHEK GUIDE) w/Device KIT Use as directed. 1 kit 0   Glucose Blood (BLOOD GLUCOSE TEST STRIPS) STRP Use up to four times daily as directed. (FOR ICD-10 E10.9, E11.9). 200 strip 10   ibuprofen (ADVIL) 200 MG tablet Take 400 mg by mouth every 6 (six) hours as needed for headache or mild pain (pain score 1-3).     insulin  aspart (NOVOLOG ) 100 UNIT/ML FlexPen Inject 0-11 Units into the skin 3 (three) times daily with meals. If eating and Blood Glucose (BG) 80 or higher inject 4 units for meal coverage and add correction dose per scale. If not eating, correction dose only. BG <150= 0 unit; BG 150-200= 1 unit; BG 201-250= 3 unit; BG 251-300= 5 unit; BG 301-350= 7 unit; BG 351-400= 9 unit; BG >400= 11 unit and Call Primary Care. 15 mL 5   Insulin  Glargine (BASAGLAR  KWIKPEN) 100 UNIT/ML Inject 35 Units into the skin daily. May substitute as  needed per insurance. 15 mL 5   Insulin  Pen Needle 32G X 4 MM MISC Use up to four times daily as directed. 100 each 10   Lancet Device MISC 1 each by Does not apply route as directed. Dispense based on patient and insurance preference. Use up to four times daily as directed. (FOR ICD-10 E10.9, E11.9). 1 each 0   Lancets MISC Use up to four times daily as directed. 200 each 10   linezolid (ZYVOX) 600 MG tablet Take 1  tablet (600 mg total) by mouth 2 (two) times daily for 14 days. 28 tablet 0   sertraline  (ZOLOFT ) 25 MG tablet Take 1 tablet (25 mg total) by mouth daily. 90 tablet 2   hydroxypropyl methylcellulose / hypromellose (ISOPTO TEARS / GONIOVISC) 2.5 % ophthalmic solution Place 2 drops into both eyes daily as needed for dry eyes. (Patient not taking: Reported on 04/06/2024)     No facility-administered medications prior to visit.     Social History   Socioeconomic History   Marital status: Single    Spouse name: Not on file   Number of children: Not on file   Years of education: Not on file   Highest education level: Not on file  Occupational History   Occupation: Unemployed  Tobacco Use   Smoking status: Never   Smokeless tobacco: Never  Vaping Use   Vaping status: Never Used  Substance and Sexual Activity   Alcohol use: Not Currently    Comment: last drink 2018   Drug use: Never   Sexual activity: Not Currently  Other Topics Concern   Not on file  Social History Narrative   Live with parents in [place].    Social Drivers of Health   Financial Resource Strain: Patient Declined (05/24/2023)   Received from [facility]   Overall Financial Resource Strain (CARDIA)    Difficulty of Paying Living Expenses: Patient declined  Recent Concern: Financial Resource Strain - High Risk (04/06/2023)   Overall Financial Resource Strain (CARDIA)    Difficulty of Paying Living Expenses: Very hard  Food Insecurity: No Food Insecurity (03/17/2024)   Hunger Vital Sign    Worried About Running Out of Food in the Last Year: Never true    Ran Out of Food in the Last Year: Never true  Transportation Needs: No Transportation Needs (03/17/2024)   PRAPARE - Administrator, Civil Service (Medical): No    Lack of Transportation (Non-Medical): No  Physical Activity: Inactive (04/06/2023)   Exercise Vital Sign    Days of Exercise per Week: 0 days    Minutes of  Exercise per Session: 0 min  Stress: Stress Concern Present (04/06/2023)   [employer] of Occupational Health - Occupational Stress Questionnaire    Feeling of Stress : Very much  Social Connections: Socially Isolated (04/06/2023)   Social Connection and Isolation Panel    Frequency of Communication with Friends and Family: More than three times a week    Frequency of Social Gatherings with Friends and Family: More than three times a week    Attends Religious Services: Never    Database Administrator or Organizations: No    Attends Banker Meetings: Never    Marital Status: Never married  Intimate Partner Violence: Not At Risk (03/17/2024)   Humiliation, Afraid, Rape, and Kick questionnaire    Fear of Current or Ex-Partner: No    Emotionally Abused: No    Physically Abused: No  Sexually Abused: No      Review of Systems    All other ros negative  Objective:    BP 120/78   Pulse 96   Temp 98 F (36.7 C) (Temporal)   Resp 16   Wt 296 lb 12.8 oz (134.6 kg)   SpO2 99%   BMI 37.10 kg/m  Nursing note and vital signs reviewed.  Physical Exam     General/constitutional: no distress, pleasant HEENT: Normocephalic, PER, Conj Clear, EOMI, Oropharynx clear Neck supple CV: rrr no mrg Lungs: clear to auscultation, normal respiratory effort Abd: Soft, Nontender Ext: no edema Skin/msk: much improved foot ulcers    Labs: Lab Results  Component Value Date   WBC 7.1 03/13/2024   HGB 13.2 03/13/2024   HCT 39.7 03/13/2024   MCV 84.8 03/13/2024   PLT 209 03/13/2024   .[email or web address]  Micro:  Serology:  Imaging:  Assessment & Plan:   Problem List Items Addressed This Visit     Diabetic foot infection (HCC)   MSSA bacteremia - Primary   Relevant Orders   CBC   COMPLETE METABOLIC PANEL WITHOUT GFR   Other Visit Diagnoses       Type 1 diabetes mellitus with hyperglycemia (HCC)             No orders of the defined types were placed in this  encounter.    43 yo male with htn, dm2, admitted 03/12/2024 for mssa baceremia in setting left foot wound  10/17 tte no vegetation 10/18 tee no vegetation  10/16 bcx mssa 10/18 bcx negative  Mri left foot suggest chronic om 3rd toe  Picture taken from hospital does show dry ulcer left foot 3rd toe and around distal 4th/5th metatarsal Source mssa presumed foot  Patient refused amputation   He received oritavancin  in the hospital and transitioned to linezolid    ------- 04/06/24 id clinic assessment Wounds size improving -> 3rd toe ulcer gone, the bottom of the foot near the 4th metatarsal area is improving in size  He has a few more days of linezolid left  I advise 4 weeks of antibiotics total from 10/18 until 04/08/24  Labs today  Follow up 4 weeks with us  for post abx check  Continue to follow up wound care -- agree probably should see podiatry for long term care as well  ?eustachian tube dysfunction -- ok to finish  few more days of linezolid   Advise keeping cbg < 180. Reports polydipsia, polyuria, and lightheadedness. Last A1c  Lab Results  Component Value Date   HGBA1C 12.9 (H) 03/10/2024   F/u pcp  Follow-up: Return in about 4 weeks (around 05/04/2024).      Constance ONEIDA Passer, MD [facility] 04/06/2024, 2:29 PM

## 2024-04-07 ENCOUNTER — Ambulatory Visit: Payer: MEDICAID | Attending: Internal Medicine | Admitting: Internal Medicine

## 2024-04-07 ENCOUNTER — Encounter: Payer: Self-pay | Admitting: Internal Medicine

## 2024-04-07 ENCOUNTER — Other Ambulatory Visit: Payer: Self-pay

## 2024-04-07 VITALS — BP 100/71 | HR 113 | Wt 301.6 lb

## 2024-04-07 DIAGNOSIS — M86472 Chronic osteomyelitis with draining sinus, left ankle and foot: Secondary | ICD-10-CM | POA: Diagnosis not present

## 2024-04-07 DIAGNOSIS — Z794 Long term (current) use of insulin: Secondary | ICD-10-CM

## 2024-04-07 DIAGNOSIS — E1165 Type 2 diabetes mellitus with hyperglycemia: Secondary | ICD-10-CM | POA: Diagnosis not present

## 2024-04-07 DIAGNOSIS — F331 Major depressive disorder, recurrent, moderate: Secondary | ICD-10-CM

## 2024-04-07 DIAGNOSIS — L97529 Non-pressure chronic ulcer of other part of left foot with unspecified severity: Secondary | ICD-10-CM | POA: Diagnosis not present

## 2024-04-07 DIAGNOSIS — H6123 Impacted cerumen, bilateral: Secondary | ICD-10-CM

## 2024-04-07 DIAGNOSIS — Z09 Encounter for follow-up examination after completed treatment for conditions other than malignant neoplasm: Secondary | ICD-10-CM

## 2024-04-07 DIAGNOSIS — Z2821 Immunization not carried out because of patient refusal: Secondary | ICD-10-CM

## 2024-04-07 DIAGNOSIS — E11621 Type 2 diabetes mellitus with foot ulcer: Secondary | ICD-10-CM

## 2024-04-07 DIAGNOSIS — R42 Dizziness and giddiness: Secondary | ICD-10-CM

## 2024-04-07 DIAGNOSIS — E1169 Type 2 diabetes mellitus with other specified complication: Secondary | ICD-10-CM

## 2024-04-07 DIAGNOSIS — E785 Hyperlipidemia, unspecified: Secondary | ICD-10-CM

## 2024-04-07 DIAGNOSIS — Z7984 Long term (current) use of oral hypoglycemic drugs: Secondary | ICD-10-CM

## 2024-04-07 LAB — COMPLETE METABOLIC PANEL WITHOUT GFR
AG Ratio: 1.4 (calc) (ref 1.0–2.5)
ALT: 20 U/L (ref 9–46)
AST: 16 U/L (ref 10–40)
Albumin: 4.7 g/dL (ref 3.6–5.1)
Alkaline phosphatase (APISO): 86 U/L (ref 36–130)
BUN: 14 mg/dL (ref 7–25)
CO2: 24 mmol/L (ref 20–32)
Calcium: 9.6 mg/dL (ref 8.6–10.3)
Chloride: 99 mmol/L (ref 98–110)
Creat: 0.97 mg/dL (ref 0.60–1.29)
Globulin: 3.3 g/dL (ref 1.9–3.7)
Glucose, Bld: 293 mg/dL — ABNORMAL HIGH (ref 65–99)
Potassium: 4.5 mmol/L (ref 3.5–5.3)
Sodium: 134 mmol/L — ABNORMAL LOW (ref 135–146)
Total Bilirubin: 0.5 mg/dL (ref 0.2–1.2)
Total Protein: 8 g/dL (ref 6.1–8.1)

## 2024-04-07 LAB — CBC
HCT: 47.2 % (ref 38.5–50.0)
Hemoglobin: 15.5 g/dL (ref 13.2–17.1)
MCH: 28.3 pg (ref 27.0–33.0)
MCHC: 32.8 g/dL (ref 32.0–36.0)
MCV: 86.1 fL (ref 80.0–100.0)
MPV: 9.8 fL (ref 7.5–12.5)
Platelets: 247 Thousand/uL (ref 140–400)
RBC: 5.48 Million/uL (ref 4.20–5.80)
RDW: 13 % (ref 11.0–15.0)
WBC: 8.3 Thousand/uL (ref 3.8–10.8)

## 2024-04-07 MED ORDER — INSULIN ASPART 100 UNIT/ML FLEXPEN
8.0000 [IU] | PEN_INJECTOR | Freq: Three times a day (TID) | SUBCUTANEOUS | 5 refills | Status: AC
  Filled 2024-04-07: qty 15, fill #0
  Filled 2024-05-04: qty 15, 63d supply, fill #0

## 2024-04-07 MED ORDER — LANTUS SOLOSTAR 100 UNIT/ML ~~LOC~~ SOPN
40.0000 [IU] | PEN_INJECTOR | Freq: Every day | SUBCUTANEOUS | 1 refills | Status: AC
  Filled 2024-04-07: qty 45, 112d supply, fill #0
  Filled 2024-05-04: qty 15, 37d supply, fill #0
  Filled 2024-06-29: qty 15, 37d supply, fill #1

## 2024-04-07 MED ORDER — ROSUVASTATIN CALCIUM 10 MG PO TABS
10.0000 mg | ORAL_TABLET | Freq: Every day | ORAL | 3 refills | Status: AC
  Filled 2024-04-07 (×2): qty 90, 90d supply, fill #0

## 2024-04-07 MED ORDER — METFORMIN HCL ER 500 MG PO TB24
1000.0000 mg | ORAL_TABLET | Freq: Every day | ORAL | 1 refills | Status: AC
  Filled 2024-04-07 (×2): qty 180, 90d supply, fill #0

## 2024-04-07 MED ORDER — BASAGLAR KWIKPEN 100 UNIT/ML ~~LOC~~ SOPN
40.0000 [IU] | PEN_INJECTOR | Freq: Every day | SUBCUTANEOUS | 5 refills | Status: DC
  Filled 2024-04-07: qty 15, 37d supply, fill #0

## 2024-04-07 NOTE — Patient Instructions (Signed)
  VISIT SUMMARY: You came in today for a follow-up after your recent hospitalization for a bone infection in your left foot and a blood infection. We discussed your diabetes management, your current medications, and your overall health. You are currently on antibiotics and have been following up with the wound clinic for your foot ulcers. We also talked about your depression, ear issues, and immunizations.  YOUR PLAN: -TYPE 2 DIABETES MELLITUS WITH FOOT ULCER AND CHRONIC OSTEOMYELITIS, LEFT FOOT: You have a chronic bone infection in your left foot, confirmed by MRI, and a bacterial blood infection. Your foot ulcer on the toes has healed, and the ulcer on the bottom of your foot is getting smaller. Continue taking nasolab 600 mg twice daily until November 15th. We recommend seeing a podiatrist or orthopedic specialist for ongoing management of your foot ulcers. Your Basaglar  insulin  dose is increased to 40 units daily, and your Novolog  insulin  is adjusted to 8 units with meals. Restart taking metformin  XR 1000 mg daily. Please make dietary changes by limiting sugary drinks, portion sizes, and white carbohydrates. Schedule a follow-up in one month with the clinical pharmacist and bring your blood sugar readings.  -TYPE 2 DIABETES MELLITUS WITH OTHER SPECIFIED COMPLICATION: Your A1c level is 12.9%, indicating poor blood sugar control. This was due to stopping your medications because of depression. Your blood sugar readings are in the low to mid-200s. You declined a continuous glucose monitor. Your Basaglar  insulin  dose is increased to 40 units daily, and your Novolog  insulin  is adjusted to 8 units with meals. Restart taking metformin  XR 1000 mg daily. Please make dietary changes by limiting sugary drinks, portion sizes, and white carbohydrates. Schedule a follow-up in one month with the clinical pharmacist and bring your blood sugar readings.  -DEPRESSION: Your depression previously led to stopping your  diabetes medications. You are currently stable on sertraline  25 mg daily. We will refer you to behavioral health for further management and to discuss atomoxetine .  -IMPACTED CERUMEN, BILATERAL: You have significant earwax buildup causing dizziness and muffled hearing. Use an over-the-counter wax softener for 3-4 days to soften the earwax.  -IMMUNIZATION NOT CARRIED OUT BECAUSE OF PATIENT REFUSAL: You declined the flu shot and pneumonia vaccine.  INSTRUCTIONS: Please continue taking nasolab 600 mg twice daily until November 15th. Schedule a follow-up appointment in one month with the clinical pharmacist and bring your blood sugar readings. We recommend seeing a podiatrist or orthopedic specialist for ongoing management of your foot ulcers. Use an over-the-counter wax softener for 3-4 days to soften the earwax. We will refer you to behavioral health for further management and to discuss atomoxetine .                      Contains text generated by [employer].                                 Contains text generated by [employer]bridge.

## 2024-04-07 NOTE — Progress Notes (Signed)
 Patient ID: [name] Stclair., male    DOB: 1980-12-20  MRN: [id number]  CC: Hospitalization Follow-up (Patient want to get back on metformin )   Subjective: [name] is a 43 y.o. male who presents for hosp f/u and chronic ds management. His concerns today include:  Pt with hx of DM type 2/obesity, HL, HTN (off meds x 15 yrs), recurrent DM foot ulcers, amputation LT big toe (02/2020), RT big toe amputation 2023, MDD   Discussed the use of AI scribe software for clinical note transcription with the patient, who gave verbal consent to proceed.  History of Present Illness [name]. is a 43 year old male with diabetes and hypertension who presents for follow-up after recent hospitalization for osteomyelitis LT foot and MSSA bacteremia.  He was hospitalized from October 16th to the 23rd for osteomyelitis in the left foot, associated with a chronic left foot ulcer for which he had not sort care in a while. An MRI confirmed chronic osteomyelitis. During hospitalization, he was also diagnosed with MSSA bacteremia. An echocardiogram was performed to rule out vegetations on the heart valves, which was negative. He declined foot amputation. He is currently on a course of the antibiotic 'Linezolid 600 mg twice daily, which is set to conclude on November 15th. He follows up with a wound clinic weekly, where the toe wound has healed, and the ulcer on the bottom of the foot is reducing in size. Has seen ID, Dr. Overton, in f/u yesterday. No fever or pain in the foot.  DM: Regarding his diabetes, his A1c was 12.9 during hospitalization, attributed to a lapse in medication adherence due to depression. He was discharged on Basaglar  insulin  35 units and Novolog  sliding scale.  Currently averaging about 3 units of NovoLog  with meals.  Prescribed CGM in the past which did not work out for him.  He had 3 that fell off in a row.  He checks his blood sugar before meals, with readings in the  low to mid-200s.  He was not discharged on metformin  but had been on it last year and states that he has resumed taking metformin  XR 1000 mg daily from an old bottle.    He has a history of depression and is currently on sertraline  25 mg, which is keeping him stable. He wants to resume seeing BH.  He experiences dizziness and muted sounds in both ears upon standing, which he attributes to not being on any blood pressure medication and dehydrated. He has a history of cholesterol issues and was previously on pravastatin , but is not currently taking any cholesterol medication.    Patient Active Problem List   Diagnosis Date Noted   Acute osteomyelitis of left foot (HCC) 03/14/2024   Pressure injury of skin 03/13/2024   MSSA bacteremia 03/12/2024   Sepsis (HCC) 03/09/2024   Diabetic foot infection (HCC) 02/26/2023   MDD (major depressive disorder), recurrent, in partial remission 11/19/2021   Left foot infection    Bipolar depression (HCC) 09/10/2021   Adjustment disorder with mixed anxiety and depressed mood 09/10/2021   Attention deficit hyperactivity disorder (ADHD), predominantly inattentive type 07/30/2020   Anxiety with depression 06/07/2020   Hyperlipidemia due to type 2 diabetes mellitus (HCC) 03/27/2020   Status post amputation of left great toe 03/26/2020   Influenza vaccination declined 03/26/2020   23-polyvalent pneumococcal polysaccharide vaccine declined 03/26/2020   COVID-19 vaccine series completed 03/26/2020   Moderate major depression, single episode (HCC) 03/26/2020   Anaerobic  bacteremia 03/07/2020   Major depressive disorder, recurrent episode, moderate (HCC) 03/04/2020   Abscess of great toe, left    Sepsis due to group B Streptococcus with acute renal failure (HCC)    Cellulitis of left toe 03/03/2020   Diabetic ketoacidosis (HCC) 03/03/2020   DKA (diabetic ketoacidosis) (HCC) 03/03/2020   Ulcer of right foot with fat layer exposed (HCC) 06/06/2019   Non-pressure  chronic ulcer of other part of left foot limited to breakdown of skin (HCC) 06/06/2019   Uncontrolled type 2 diabetes mellitus with hyperglycemia (HCC) 05/02/2018   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) 07/30/2017   Venous insufficiency of both lower extremities 07/30/2017   Obesity, Class III, BMI 40-49.9 (morbid obesity) (HCC) 07/30/2017   Essential hypertension 09/03/2016     Current Outpatient Medications on File Prior to Visit  Medication Sig Dispense Refill   Blood Glucose Monitoring Suppl (ACCU-CHEK GUIDE) w/Device KIT Use as directed. 1 kit 0   Glucose Blood (BLOOD GLUCOSE TEST STRIPS) STRP Use up to four times daily as directed. (FOR ICD-10 E10.9, E11.9). 200 strip 10   hydroxypropyl methylcellulose / hypromellose (ISOPTO TEARS / GONIOVISC) 2.5 % ophthalmic solution Place 2 drops into both eyes daily as needed for dry eyes.     ibuprofen (ADVIL) 200 MG tablet Take 400 mg by mouth every 6 (six) hours as needed for headache or mild pain (pain score 1-3).     Insulin  Pen Needle 32G X 4 MM MISC Use up to four times daily as directed. 100 each 10   Lancet Device MISC 1 each by Does not apply route as directed. Dispense based on patient and insurance preference. Use up to four times daily as directed. (FOR ICD-10 E10.9, E11.9). 1 each 0   Lancets MISC Use up to four times daily as directed. 200 each 10   sertraline  (ZOLOFT ) 25 MG tablet Take 1 tablet (25 mg total) by mouth daily. 90 tablet 2   No current facility-administered medications on file prior to visit.    Allergies  Allergen Reactions   Milk-Related Compounds Diarrhea    Social History   Socioeconomic History   Marital status: Single    Spouse name: Not on file   Number of children: Not on file   Years of education: Not on file   Highest education level: Not on file  Occupational History   Occupation: Unemployed  Tobacco Use   Smoking status: Never   Smokeless tobacco: Never  Vaping Use   Vaping status: Never  Used  Substance and Sexual Activity   Alcohol use: Not Currently    Comment: last drink 2018   Drug use: Never   Sexual activity: Not Currently  Other Topics Concern   Not on file  Social History Narrative   Live with parents in [place].    Social Drivers of Health   Financial Resource Strain: Patient Declined (05/24/2023)   Received from [facility]   Overall Financial Resource Strain (CARDIA)    Difficulty of Paying Living Expenses: Patient declined  Recent Concern: Financial Resource Strain - High Risk (04/06/2023)   Overall Financial Resource Strain (CARDIA)    Difficulty of Paying Living Expenses: Very hard  Food Insecurity: No Food Insecurity (03/17/2024)   Hunger Vital Sign    Worried About Running Out of Food in the Last Year: Never true    Ran Out of Food in the Last Year: Never true  Transportation Needs: No Transportation Needs (03/17/2024)   PRAPARE -  Administrator, Civil Service (Medical): No    Lack of Transportation (Non-Medical): No  Physical Activity: Inactive (04/06/2023)   Exercise Vital Sign    Days of Exercise per Week: 0 days    Minutes of Exercise per Session: 0 min  Stress: Stress Concern Present (04/06/2023)   [employer] of Occupational Health - Occupational Stress Questionnaire    Feeling of Stress : Very much  Social Connections: Socially Isolated (04/06/2023)   Social Connection and Isolation Panel    Frequency of Communication with Friends and Family: More than three times a week    Frequency of Social Gatherings with Friends and Family: More than three times a week    Attends Religious Services: Never    Database Administrator or Organizations: No    Attends Banker Meetings: Never    Marital Status: Never married  Intimate Partner Violence: Not At Risk (03/17/2024)   Humiliation, Afraid, Rape, and Kick questionnaire    Fear of Current or Ex-Partner: No    Emotionally Abused: No     Physically Abused: No    Sexually Abused: No    Family History  Problem Relation Age of Onset   Diabetes Mother    Hyperlipidemia Father    Hypertension Father     Past Surgical History:  Procedure Laterality Date   AMPUTATION Left 03/06/2020   Procedure: LEFT GREAT TOE AMPUTATION;  Surgeon: Harden Jerona GAILS, MD;  Location: [facility] OR;  Service: Orthopedics;  Laterality: Left;   AMPUTATION Right 10/15/2021   Procedure: RIGHT GREAT TOE AMPUTATION;  Surgeon: Harden Jerona GAILS, MD;  Location: [facility] OR;  Service: Orthopedics;  Laterality: Right;   BONE BIOPSY Left 02/25/2023   Procedure: LEFT FIFTH METATARSAL BONE BIOPSY;  Surgeon: Lennie Barter, DPM;  Location: [facility] ORS;  Service: Podiatry;  Laterality: Left;   EYE SURGERY Bilateral    lasik   TRANSESOPHAGEAL ECHOCARDIOGRAM (CATH LAB) N/A 03/14/2024   Procedure: TRANSESOPHAGEAL ECHOCARDIOGRAM;  Surgeon: Shlomo Wilbert SAUNDERS, MD;  Location: [facility] INVASIVE [facility] LAB;  Service: Cardiovascular;  Laterality: N/A;   TRANSMETATARSAL AMPUTATION Right 02/25/2023   Procedure: RIGHT PARTIAL FIRST RAY AMPUTATION WITH ANTIBIOTIC BEAD APPLICATION;  Surgeon: Lennie Barter, DPM;  Location: [facility] ORS;  Service: Podiatry;  Laterality: Right;   WISDOM TOOTH EXTRACTION      ROS: Review of Systems Negative except as stated above  PHYSICAL EXAM: BP 100/71 (BP Location: Left Arm, Patient Position: Standing, Cuff Size: Normal)   Pulse (!) 113   Wt (!) 301 lb 9.6 oz (136.8 kg)   SpO2 99%   BMI 37.70 kg/m   Wt Readings from Last 3 Encounters:  04/07/24 (!) 301 lb 9.6 oz (136.8 kg)  04/06/24 296 lb 12.8 oz (134.6 kg)  03/09/24 (!) 330 lb (149.7 kg)   Sitting: BP 110/70, pulse 96 Standing: BP 100/71, pulse 113  Physical Exam General appearance - alert, well appearing, obese middle age caucasian male and in no distress Eyes: pink conjunctiva Ears - cerumen impaction noted BL Neck - supple, no significant adenopathy Chest - clear to auscultation, no wheezes, rales or  rhonchi, symmetric air entry Heart - normal rate, regular rhythm, normal S1, S2, no murmurs, rubs, clicks or gallops Extremities - peripheral pulses normal, no pedal edema, no clubbing or cyanosis Diabetic Foot Exam - Simple   Simple Foot Form Diabetic Foot exam was performed with the following findings: Yes 04/07/2024 10:10 AM  Visual Inspection See comments: Yes Sensation Testing See comments: Yes  Pulse Check Posterior Tibialis and Dorsalis pulse intact bilaterally: Yes Comments The toes on both feet have been amputated.  Skin on both feet plantar surface very dry and cracked. Patient with small ulcer on the ball of the left foot laterally.  See picture below.  Toenails are thick and discolored.        Latest Ref Rng & Units 04/06/2024    2:48 PM 03/13/2024    5:36 AM 03/11/2024    6:08 AM  CMP  Glucose 65 - 99 mg/dL 706  761  768   BUN 7 - 25 mg/dL 14  8  8    Creatinine 0.60 - 1.29 mg/dL 9.02  9.31  9.23   Sodium 135 - 146 mmol/L 134  136  136   Potassium 3.5 - 5.3 mmol/L 4.5  3.8  3.6   Chloride 98 - 110 mmol/L 99  102  103   CO2 20 - 32 mmol/L 24  26  20    Calcium  8.6 - 10.3 mg/dL 9.6  8.6  8.2   Total Protein 6.1 - 8.1 g/dL 8.0     Total Bilirubin 0.2 - 1.2 mg/dL 0.5     AST 10 - 40 U/L 16     ALT 9 - 46 U/L 20      Lipid Panel     Component Value Date/Time   CHOL 212 (H) 12/31/2022 1519   TRIG 216 (H) 12/31/2022 1519   HDL 35 (L) 12/31/2022 1519   CHOLHDL 6.1 (H) 12/31/2022 1519   LDLCALC 138 (H) 12/31/2022 1519    CBC    Component Value Date/Time   WBC 8.3 04/06/2024 1448   RBC 5.48 04/06/2024 1448   HGB 15.5 04/06/2024 1448   HGB 15.5 12/31/2022 1519   HCT 47.2 04/06/2024 1448   HCT 48.2 12/31/2022 1519   PLT 247 04/06/2024 1448   PLT 329 12/31/2022 1519   MCV 86.1 04/06/2024 1448   MCV 85 12/31/2022 1519   MCH 28.3 04/06/2024 1448   MCHC 32.8 04/06/2024 1448   RDW 13.0 04/06/2024 1448   RDW 13.4 12/31/2022 1519   LYMPHSABS 2.8 03/13/2024 0536    LYMPHSABS 3.3 (H) 06/04/2016 1030   MONOABS 0.7 03/13/2024 0536   EOSABS 0.2 03/13/2024 0536   EOSABS 0.1 06/04/2016 1030   BASOSABS 0.1 03/13/2024 0536   BASOSABS 0.0 06/04/2016 1030    ASSESSMENT AND PLAN: 1. Hospital discharge follow-up (Primary)   2. 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 Diabetic ulcer of left foot associated with type 2 diabetes mellitus, unspecified part of foot, unspecified ulcer stage (HCC) -Patient declined foot amputation during hospitalization.  He is now almost completed 4 weeks of linezolid and is followed by wound care.  He reports that the wound is healing.  Will get him back in with orthopedics Dr. Harden for intermittent monitoring. - AMB referral to orthopedics   4. Uncontrolled type 2 diabetes mellitus with hyperglycemia (HCC) Patient reporting fasting blood sugars in the 200s. Recommend increase Basaglar  to 40 units daily, change NovoLog  from sliding scale to 8 units with meals.  Refill given on metformin  XR 1 g daily. Patient to follow-up with clinical pharmacist in 1 month.  Advised to bring readings with him. Dietary counseling given. - insulin  aspart (NOVOLOG ) 100 UNIT/ML FlexPen; Inject 8 Units into the skin 3 (three) times daily with meals.  Dispense: 15 mL; Refill: 5 - metFORMIN  (GLUCOPHAGE -XR) 500 MG 24 hr tablet; Take 2 tablets (1,000 mg total)  by mouth daily with breakfast.  Dispense: 180 tablet; Refill: 1  5. Hyperlipidemia due to type 2 diabetes mellitus (HCC) Restart statin therapy for CV benefits. - rosuvastatin (CRESTOR) 10 MG tablet; Take 1 tablet (10 mg total) by mouth daily.  Dispense: 90 tablet; Refill: 3  6. Major depressive disorder, recurrent episode, moderate (HCC) He declines increased dose of Zoloft  from 25 mg.  Feels he is doing okay on this low dose.  Will get him back in with behavioral health. - Ambulatory referral to Psychiatry  7. Impacted cerumen of both ears Patient with muffled hearing  in both ears due to impacted cerumen.  I recommend referral to ENT to have his ears cleared out but patient declines.  I told him that he can try some wax softener over-the-counter for 3 to 4 days.  8. Dizziness Patient with increased pulse rate upon standing suggesting mild dehydration.  Recommend that he drinks at least 4 to 8 glasses of water daily.  Go slow with position changes.  9. Influenza vaccination declined 10. Pneumococcal vaccination declined Both recommended but patient declined.   Patient was given the opportunity to ask questions.  Patient verbalized understanding of the plan and was able to repeat key elements of the plan.   This documentation was completed using Paediatric nurse.  Any transcriptional errors are unintentional.  Orders Placed This Encounter  Procedures   AMB referral to orthopedics   Ambulatory referral to Psychiatry     Requested Prescriptions   Signed Prescriptions Disp Refills   insulin  aspart (NOVOLOG ) 100 UNIT/ML FlexPen 15 mL 5    Sig: Inject 8 Units into the skin 3 (three) times daily with meals.   metFORMIN  (GLUCOPHAGE -XR) 500 MG 24 hr tablet 180 tablet 1    Sig: Take 2 tablets (1,000 mg total) by mouth daily with breakfast.   rosuvastatin (CRESTOR) 10 MG tablet 90 tablet 3    Sig: Take 1 tablet (10 mg total) by mouth daily.   insulin  glargine (LANTUS  SOLOSTAR) 100 UNIT/ML Solostar Pen 45 mL 1    Sig: Inject 40 Units into the skin daily.    Return in about 4 months (around 08/05/2024) for 4 weeks with clinical pharmacist for DM.  Barnie Louder, MD, FACP

## 2024-04-08 ENCOUNTER — Encounter: Payer: Self-pay | Admitting: Internal Medicine

## 2024-04-10 ENCOUNTER — Ambulatory Visit: Admitting: Orthopedic Surgery

## 2024-04-10 ENCOUNTER — Encounter: Payer: Self-pay | Admitting: Orthopedic Surgery

## 2024-04-10 DIAGNOSIS — M86672 Other chronic osteomyelitis, left ankle and foot: Secondary | ICD-10-CM

## 2024-04-10 DIAGNOSIS — S98111A Complete traumatic amputation of right great toe, initial encounter: Secondary | ICD-10-CM

## 2024-04-10 DIAGNOSIS — Z89411 Acquired absence of right great toe: Secondary | ICD-10-CM | POA: Diagnosis not present

## 2024-04-10 NOTE — Progress Notes (Signed)
 Office Visit Note   Patient: [name].           Date of Birth: 10/07/1980           MRN: [id number] Visit Date: 04/10/2024              Requested by: Vicci Barnie NOVAK, MD [address] [place] [address] [place],  [place] [address] PCP: Vicci Barnie NOVAK, MD  Chief Complaint  Patient presents with   Left Foot - Wound Check      HPI: Discussed the use of AI scribe software for clinical note transcription with the patient, who gave verbal consent to proceed.  History of Present Illness [name] Schlarb. is a 43 year old male who presents with edema in the bone at the MTP joint of the fourth toe beneath a healed ulcer.  He has a history of a healed ulcer on the bottom of his fourth toe and is currently using an offloading shoe at home to reduce pressure on the area. He also uses Hydroferro Blue padding and large Band-Aids to cover the wound. There is some minimal drainage from the area, and the wound is shrinking. He has been on antibiotics previously, and there is no current sign of infection in the blood.     Assessment & Plan: Visit Diagnoses:  1. Amputated great toe, right   2. Chronic osteomyelitis of left foot (HCC)     Plan: Assessment and Plan Assessment & Plan Healing ulcer beneath fourth metatarsal head with underlying bone and soft tissue edema at fourth toe MTP joint MRI showed bone and soft tissue edema at the fourth toe MTP joint beneath the ulcer. Ulcer healing with granulation tissue, no sinus tract. Condition stable, no urgent surgery needed. - Continue using offloading shoe. - Wash area with soap and water. - Apply Hydroferro Blue padding and cover with Band-Aid. - Re-evaluate in four weeks.  Sausage swelling of third toe Sausage swelling present without cellulitis. Monitoring alongside fourth toe MTP joint. - Monitor for changes. - Re-evaluate in four weeks.      Follow-Up Instructions: No follow-ups on file.   Ortho  Exam  Patient is alert, oriented, no adenopathy, well-dressed, normal affect, normal respiratory effort. Physical Exam CARDIOVASCULAR: Strong palpable dorsalis pedis pulse. MUSCULOSKELETAL: Ulcer beneath fourth metatarsal head with healthy granulation tissue. Sausage swelling of third toe, no cellulitis. Ulcer healed.  MRI scan shows mild edema at the MTP joint of the fourth toe consistent with early osteomyelitis and edema in the third toe also consistent with osteomyelitis however patient's foot continues to improve.      Imaging: No results found. No images are attached to the encounter.  Labs: Lab Results  Component Value Date   HGBA1C 12.9 (H) 03/10/2024   HGBA1C 9.6 (H) 02/24/2023   HGBA1C 11.9 (A) 12/31/2022   ESRSEDRATE 24 (H) 03/14/2024   ESRSEDRATE 24 (H) 12/31/2022   ESRSEDRATE 48 (H) 03/21/2020   CRP 1.2 (H) 03/14/2024   CRP 12 (H) 12/31/2022   CRP 5.6 03/21/2020   LABURIC 4.6 06/26/2021   LABURIC 5.1 02/18/2021   REPTSTATUS 03/16/2024 FINAL 03/11/2024   GRAMSTAIN NO WBC SEEN NO ORGANISMS SEEN  02/25/2023   CULT  03/11/2024    NO GROWTH 5 DAYS Performed at [facility], [address] [place], [place] [address]    [facility] STAPHYLOCOCCUS AUREUS 03/09/2024     Lab Results  Component Value Date   ALBUMIN 2.8 (L) 03/13/2024  ALBUMIN 2.9 (L) 03/11/2024   ALBUMIN 3.0 (L) 03/10/2024    Lab Results  Component Value Date   MG 2.0 03/13/2024   MG 1.9 03/11/2024   Lab Results  Component Value Date   VD25OH 18.80 (L) 03/11/2024    No results found for: PREALBUMIN    Latest Ref Rng & Units 04/06/2024    2:48 PM 03/13/2024    5:36 AM 03/11/2024    6:08 AM  CBC EXTENDED  WBC 3.8 - 10.8 Thousand/uL 8.3  7.1  7.6   RBC 4.20 - 5.80 Million/uL 5.48  4.68  4.67   Hemoglobin 13.2 - 17.1 g/dL 84.4  86.7  86.7   HCT 38.5 - 50.0 % 47.2  39.7  39.3   Platelets 140 - 400 Thousand/uL 247  209  146   NEUT# 1.7 - 7.7 K/uL  3.4  4.9   Lymph# 0.7 - 4.0  K/uL  2.8  1.7      There is no height or weight on file to calculate BMI.  Orders:  No orders of the defined types were placed in this encounter.  No orders of the defined types were placed in this encounter.    Procedures: No procedures performed  Clinical Data: No additional findings.  ROS:  All other systems negative, except as noted in the HPI. Review of Systems  Objective: Vital Signs: There were no vitals taken for this visit.  Specialty Comments:  No specialty comments available.  PMFS History: Patient Active Problem List   Diagnosis Date Noted   Acute osteomyelitis of left foot (HCC) 03/14/2024   Pressure injury of skin 03/13/2024   MSSA bacteremia 03/12/2024   Sepsis (HCC) 03/09/2024   Diabetic foot infection (HCC) 02/26/2023   MDD (major depressive disorder), recurrent, in partial remission 11/19/2021   Left foot infection    Bipolar depression (HCC) 09/10/2021   Adjustment disorder with mixed anxiety and depressed mood 09/10/2021   Attention deficit hyperactivity disorder (ADHD), predominantly inattentive type 07/30/2020   Anxiety with depression 06/07/2020   Hyperlipidemia due to type 2 diabetes mellitus (HCC) 03/27/2020   Status post amputation of left great toe 03/26/2020   Influenza vaccination declined 03/26/2020   23-polyvalent pneumococcal polysaccharide vaccine declined 03/26/2020   COVID-19 vaccine series completed 03/26/2020   Moderate major depression, single episode (HCC) 03/26/2020   Anaerobic bacteremia 03/07/2020   Major depressive disorder, recurrent episode, moderate (HCC) 03/04/2020   Abscess of great toe, left    Sepsis due to group B Streptococcus with acute renal failure (HCC)    Cellulitis of left toe 03/03/2020   Diabetic ketoacidosis (HCC) 03/03/2020   DKA (diabetic ketoacidosis) (HCC) 03/03/2020   Ulcer of right foot with fat layer exposed (HCC) 06/06/2019   Non-pressure chronic ulcer of other part of left foot limited to  breakdown of skin (HCC) 06/06/2019   Uncontrolled type 2 diabetes mellitus with hyperglycemia (HCC) 05/02/2018   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) 07/30/2017   Venous insufficiency of both lower extremities 07/30/2017   Obesity, Class III, BMI 40-49.9 (morbid obesity) (HCC) 07/30/2017   Essential hypertension 09/03/2016   Past Medical History:  Diagnosis Date   ADHD    Anxiety    Bipolar disorder (HCC)    Depression    Diabetes mellitus without complication (HCC)    type 2   Heart murmur    as a child   HLD (hyperlipidemia)    Hypertension    no meds   Peripheral vascular disease  Family History  Problem Relation Age of Onset   Diabetes Mother    Hyperlipidemia Father    Hypertension Father     Past Surgical History:  Procedure Laterality Date   AMPUTATION Left 03/06/2020   Procedure: LEFT GREAT TOE AMPUTATION;  Surgeon: Harden Jerona GAILS, MD;  Location: [facility] OR;  Service: Orthopedics;  Laterality: Left;   AMPUTATION Right 10/15/2021   Procedure: RIGHT GREAT TOE AMPUTATION;  Surgeon: Harden Jerona GAILS, MD;  Location: [facility] OR;  Service: Orthopedics;  Laterality: Right;   BONE BIOPSY Left 02/25/2023   Procedure: LEFT FIFTH METATARSAL BONE BIOPSY;  Surgeon: Lennie Barter, DPM;  Location: [facility] ORS;  Service: Podiatry;  Laterality: Left;   EYE SURGERY Bilateral    lasik   TRANSESOPHAGEAL ECHOCARDIOGRAM (CATH LAB) N/A 03/14/2024   Procedure: TRANSESOPHAGEAL ECHOCARDIOGRAM;  Surgeon: Shlomo Wilbert SAUNDERS, MD;  Location: [facility] INVASIVE CV LAB;  Service: Cardiovascular;  Laterality: N/A;   TRANSMETATARSAL AMPUTATION Right 02/25/2023   Procedure: RIGHT PARTIAL FIRST RAY AMPUTATION WITH ANTIBIOTIC BEAD APPLICATION;  Surgeon: Lennie Barter, DPM;  Location: [facility] ORS;  Service: Podiatry;  Laterality: Right;   WISDOM TOOTH EXTRACTION     Social History   Occupational History   Occupation: Unemployed  Tobacco Use   Smoking status: Never   Smokeless tobacco: Never  Vaping Use   Vaping status:  Never Used  Substance and Sexual Activity   Alcohol use: Not Currently    Comment: last drink 2018   Drug use: Never   Sexual activity: Not Currently

## 2024-04-11 ENCOUNTER — Encounter: Admitting: Physician Assistant

## 2024-04-13 ENCOUNTER — Encounter: Payer: Self-pay | Admitting: Physician Assistant

## 2024-04-18 ENCOUNTER — Encounter: Admitting: Physician Assistant

## 2024-04-24 ENCOUNTER — Encounter: Admitting: Physician Assistant

## 2024-04-24 DIAGNOSIS — E1169 Type 2 diabetes mellitus with other specified complication: Secondary | ICD-10-CM | POA: Insufficient documentation

## 2024-04-24 DIAGNOSIS — L97522 Non-pressure chronic ulcer of other part of left foot with fat layer exposed: Secondary | ICD-10-CM | POA: Insufficient documentation

## 2024-04-24 DIAGNOSIS — Z7984 Long term (current) use of oral hypoglycemic drugs: Secondary | ICD-10-CM | POA: Insufficient documentation

## 2024-04-24 DIAGNOSIS — R2689 Other abnormalities of gait and mobility: Secondary | ICD-10-CM | POA: Insufficient documentation

## 2024-04-24 DIAGNOSIS — Z89412 Acquired absence of left great toe: Secondary | ICD-10-CM | POA: Insufficient documentation

## 2024-04-24 DIAGNOSIS — Z89411 Acquired absence of right great toe: Secondary | ICD-10-CM | POA: Insufficient documentation

## 2024-04-24 DIAGNOSIS — M86672 Other chronic osteomyelitis, left ankle and foot: Secondary | ICD-10-CM | POA: Insufficient documentation

## 2024-04-24 DIAGNOSIS — E11621 Type 2 diabetes mellitus with foot ulcer: Secondary | ICD-10-CM | POA: Insufficient documentation

## 2024-04-24 DIAGNOSIS — E1142 Type 2 diabetes mellitus with diabetic polyneuropathy: Secondary | ICD-10-CM | POA: Insufficient documentation

## 2024-04-25 ENCOUNTER — Encounter: Admitting: Physician Assistant

## 2024-04-25 DIAGNOSIS — Z89411 Acquired absence of right great toe: Secondary | ICD-10-CM | POA: Diagnosis not present

## 2024-04-25 DIAGNOSIS — L97522 Non-pressure chronic ulcer of other part of left foot with fat layer exposed: Secondary | ICD-10-CM | POA: Diagnosis not present

## 2024-04-25 DIAGNOSIS — E1142 Type 2 diabetes mellitus with diabetic polyneuropathy: Secondary | ICD-10-CM | POA: Diagnosis not present

## 2024-04-25 DIAGNOSIS — R2689 Other abnormalities of gait and mobility: Secondary | ICD-10-CM | POA: Diagnosis not present

## 2024-04-25 DIAGNOSIS — Z7984 Long term (current) use of oral hypoglycemic drugs: Secondary | ICD-10-CM | POA: Diagnosis not present

## 2024-04-25 DIAGNOSIS — E11621 Type 2 diabetes mellitus with foot ulcer: Secondary | ICD-10-CM | POA: Diagnosis not present

## 2024-04-25 DIAGNOSIS — M86672 Other chronic osteomyelitis, left ankle and foot: Secondary | ICD-10-CM | POA: Diagnosis present

## 2024-04-25 DIAGNOSIS — Z89412 Acquired absence of left great toe: Secondary | ICD-10-CM | POA: Diagnosis not present

## 2024-04-25 DIAGNOSIS — E1169 Type 2 diabetes mellitus with other specified complication: Secondary | ICD-10-CM | POA: Diagnosis not present

## 2024-04-25 LAB — GLUCOSE, CAPILLARY
Glucose-Capillary: 293 mg/dL — ABNORMAL HIGH (ref 70–99)
Glucose-Capillary: 258 mg/dL — ABNORMAL HIGH (ref 70–99)

## 2024-04-26 ENCOUNTER — Encounter

## 2024-04-26 DIAGNOSIS — E1142 Type 2 diabetes mellitus with diabetic polyneuropathy: Secondary | ICD-10-CM | POA: Diagnosis not present

## 2024-04-26 LAB — GLUCOSE, CAPILLARY: Glucose-Capillary: 485 mg/dL — ABNORMAL HIGH (ref 70–99)

## 2024-04-27 ENCOUNTER — Encounter: Admitting: Physician Assistant

## 2024-04-27 DIAGNOSIS — E1142 Type 2 diabetes mellitus with diabetic polyneuropathy: Secondary | ICD-10-CM | POA: Diagnosis not present

## 2024-04-28 ENCOUNTER — Encounter: Admitting: Physician Assistant

## 2024-05-01 ENCOUNTER — Encounter: Admitting: Internal Medicine

## 2024-05-01 ENCOUNTER — Other Ambulatory Visit: Payer: Self-pay

## 2024-05-02 ENCOUNTER — Encounter: Admitting: Internal Medicine

## 2024-05-02 DIAGNOSIS — E1142 Type 2 diabetes mellitus with diabetic polyneuropathy: Secondary | ICD-10-CM | POA: Diagnosis not present

## 2024-05-02 LAB — GLUCOSE, CAPILLARY
Glucose-Capillary: 276 mg/dL — ABNORMAL HIGH (ref 70–99)
Glucose-Capillary: 247 mg/dL — ABNORMAL HIGH (ref 70–99)

## 2024-05-03 ENCOUNTER — Encounter: Admitting: Internal Medicine

## 2024-05-04 ENCOUNTER — Other Ambulatory Visit: Payer: Self-pay

## 2024-05-04 ENCOUNTER — Encounter: Admitting: Internal Medicine

## 2024-05-04 ENCOUNTER — Ambulatory Visit: Admitting: Internal Medicine

## 2024-05-04 DIAGNOSIS — E1142 Type 2 diabetes mellitus with diabetic polyneuropathy: Secondary | ICD-10-CM | POA: Diagnosis not present

## 2024-05-04 LAB — GLUCOSE, CAPILLARY
Glucose-Capillary: 371 mg/dL — ABNORMAL HIGH (ref 70–99)
Glucose-Capillary: 237 mg/dL — ABNORMAL HIGH (ref 70–99)

## 2024-05-05 ENCOUNTER — Encounter: Admitting: Internal Medicine

## 2024-05-05 DIAGNOSIS — E1142 Type 2 diabetes mellitus with diabetic polyneuropathy: Secondary | ICD-10-CM | POA: Diagnosis not present

## 2024-05-08 ENCOUNTER — Ambulatory Visit: Admitting: Pharmacist

## 2024-05-09 ENCOUNTER — Ambulatory Visit: Admitting: Orthopedic Surgery

## 2024-05-09 ENCOUNTER — Encounter: Admitting: Internal Medicine

## 2024-05-09 DIAGNOSIS — E1142 Type 2 diabetes mellitus with diabetic polyneuropathy: Secondary | ICD-10-CM | POA: Diagnosis not present

## 2024-05-09 LAB — GLUCOSE, CAPILLARY
Glucose-Capillary: 199 mg/dL — ABNORMAL HIGH (ref 70–99)
Glucose-Capillary: 164 mg/dL — ABNORMAL HIGH (ref 70–99)

## 2024-05-10 ENCOUNTER — Encounter: Admitting: Internal Medicine

## 2024-05-10 DIAGNOSIS — E1142 Type 2 diabetes mellitus with diabetic polyneuropathy: Secondary | ICD-10-CM | POA: Diagnosis not present

## 2024-05-10 LAB — GLUCOSE, CAPILLARY
Glucose-Capillary: 421 mg/dL — ABNORMAL HIGH (ref 70–99)
Glucose-Capillary: 308 mg/dL — ABNORMAL HIGH (ref 70–99)

## 2024-05-11 ENCOUNTER — Encounter: Admitting: Internal Medicine

## 2024-05-12 ENCOUNTER — Encounter: Admitting: Internal Medicine

## 2024-05-12 DIAGNOSIS — E1142 Type 2 diabetes mellitus with diabetic polyneuropathy: Secondary | ICD-10-CM | POA: Diagnosis not present

## 2024-05-15 ENCOUNTER — Encounter: Admitting: Physician Assistant

## 2024-05-15 DIAGNOSIS — E1142 Type 2 diabetes mellitus with diabetic polyneuropathy: Secondary | ICD-10-CM | POA: Diagnosis not present

## 2024-05-15 LAB — GLUCOSE, CAPILLARY
Glucose-Capillary: 313 mg/dL — ABNORMAL HIGH (ref 70–99)
Glucose-Capillary: 229 mg/dL — ABNORMAL HIGH (ref 70–99)

## 2024-05-16 ENCOUNTER — Encounter: Admitting: Physician Assistant

## 2024-05-16 DIAGNOSIS — E1142 Type 2 diabetes mellitus with diabetic polyneuropathy: Secondary | ICD-10-CM | POA: Diagnosis not present

## 2024-05-16 LAB — GLUCOSE, CAPILLARY
Glucose-Capillary: 353 mg/dL — ABNORMAL HIGH (ref 70–99)
Glucose-Capillary: 331 mg/dL — ABNORMAL HIGH (ref 70–99)

## 2024-05-17 ENCOUNTER — Encounter: Admitting: Physician Assistant

## 2024-05-17 ENCOUNTER — Other Ambulatory Visit (HOSPITAL_COMMUNITY): Payer: Self-pay

## 2024-05-19 ENCOUNTER — Encounter: Admitting: Physician Assistant

## 2024-05-22 ENCOUNTER — Encounter: Admitting: Physician Assistant

## 2024-05-23 ENCOUNTER — Encounter: Admitting: Physician Assistant

## 2024-05-23 DIAGNOSIS — E1142 Type 2 diabetes mellitus with diabetic polyneuropathy: Secondary | ICD-10-CM | POA: Diagnosis not present

## 2024-05-23 LAB — GLUCOSE, CAPILLARY
Glucose-Capillary: 281 mg/dL — ABNORMAL HIGH (ref 70–99)
Glucose-Capillary: 292 mg/dL — ABNORMAL HIGH (ref 70–99)

## 2024-05-24 ENCOUNTER — Encounter: Admitting: Physician Assistant

## 2024-05-24 DIAGNOSIS — E1142 Type 2 diabetes mellitus with diabetic polyneuropathy: Secondary | ICD-10-CM | POA: Diagnosis not present

## 2024-05-24 LAB — GLUCOSE, CAPILLARY
Glucose-Capillary: 329 mg/dL — ABNORMAL HIGH (ref 70–99)
Glucose-Capillary: 244 mg/dL — ABNORMAL HIGH (ref 70–99)

## 2024-05-26 ENCOUNTER — Encounter: Admitting: Physician Assistant

## 2024-05-26 ENCOUNTER — Encounter: Attending: Physician Assistant | Admitting: Physician Assistant

## 2024-05-26 DIAGNOSIS — R2689 Other abnormalities of gait and mobility: Secondary | ICD-10-CM | POA: Diagnosis not present

## 2024-05-26 DIAGNOSIS — L97522 Non-pressure chronic ulcer of other part of left foot with fat layer exposed: Secondary | ICD-10-CM | POA: Insufficient documentation

## 2024-05-26 DIAGNOSIS — Z89411 Acquired absence of right great toe: Secondary | ICD-10-CM | POA: Insufficient documentation

## 2024-05-26 DIAGNOSIS — M86672 Other chronic osteomyelitis, left ankle and foot: Secondary | ICD-10-CM | POA: Insufficient documentation

## 2024-05-26 DIAGNOSIS — E1142 Type 2 diabetes mellitus with diabetic polyneuropathy: Secondary | ICD-10-CM | POA: Insufficient documentation

## 2024-05-26 DIAGNOSIS — Z89412 Acquired absence of left great toe: Secondary | ICD-10-CM | POA: Insufficient documentation

## 2024-05-26 DIAGNOSIS — E1169 Type 2 diabetes mellitus with other specified complication: Secondary | ICD-10-CM | POA: Insufficient documentation

## 2024-05-26 DIAGNOSIS — E11621 Type 2 diabetes mellitus with foot ulcer: Secondary | ICD-10-CM | POA: Diagnosis not present

## 2024-05-26 LAB — GLUCOSE, CAPILLARY
Glucose-Capillary: 327 mg/dL — ABNORMAL HIGH (ref 70–99)
Glucose-Capillary: 232 mg/dL — ABNORMAL HIGH (ref 70–99)

## 2024-05-29 ENCOUNTER — Encounter: Admitting: Physician Assistant

## 2024-05-29 DIAGNOSIS — E1169 Type 2 diabetes mellitus with other specified complication: Secondary | ICD-10-CM | POA: Diagnosis not present

## 2024-05-29 LAB — GLUCOSE, CAPILLARY
Glucose-Capillary: 306 mg/dL — ABNORMAL HIGH (ref 70–99)
Glucose-Capillary: 275 mg/dL — ABNORMAL HIGH (ref 70–99)

## 2024-05-30 ENCOUNTER — Encounter: Admitting: Physician Assistant

## 2024-05-30 DIAGNOSIS — E1169 Type 2 diabetes mellitus with other specified complication: Secondary | ICD-10-CM | POA: Diagnosis not present

## 2024-05-30 LAB — GLUCOSE, CAPILLARY
Glucose-Capillary: 347 mg/dL — ABNORMAL HIGH (ref 70–99)
Glucose-Capillary: 300 mg/dL — ABNORMAL HIGH (ref 70–99)

## 2024-05-31 ENCOUNTER — Encounter: Admitting: Physician Assistant

## 2024-05-31 DIAGNOSIS — E1169 Type 2 diabetes mellitus with other specified complication: Secondary | ICD-10-CM | POA: Diagnosis not present

## 2024-05-31 LAB — GLUCOSE, CAPILLARY
Glucose-Capillary: 404 mg/dL — ABNORMAL HIGH (ref 70–99)
Glucose-Capillary: 337 mg/dL — ABNORMAL HIGH (ref 70–99)

## 2024-06-01 ENCOUNTER — Encounter: Admitting: Physician Assistant

## 2024-06-01 DIAGNOSIS — E1169 Type 2 diabetes mellitus with other specified complication: Secondary | ICD-10-CM | POA: Diagnosis not present

## 2024-06-01 LAB — GLUCOSE, CAPILLARY
Glucose-Capillary: 227 mg/dL — ABNORMAL HIGH (ref 70–99)
Glucose-Capillary: 208 mg/dL — ABNORMAL HIGH (ref 70–99)

## 2024-06-02 ENCOUNTER — Encounter: Admitting: Physician Assistant

## 2024-06-02 DIAGNOSIS — E1169 Type 2 diabetes mellitus with other specified complication: Secondary | ICD-10-CM | POA: Diagnosis not present

## 2024-06-02 LAB — GLUCOSE, CAPILLARY
Glucose-Capillary: 292 mg/dL — ABNORMAL HIGH (ref 70–99)
Glucose-Capillary: 217 mg/dL — ABNORMAL HIGH (ref 70–99)

## 2024-06-05 ENCOUNTER — Encounter: Admitting: Physician Assistant

## 2024-06-05 DIAGNOSIS — E1169 Type 2 diabetes mellitus with other specified complication: Secondary | ICD-10-CM | POA: Diagnosis not present

## 2024-06-05 LAB — GLUCOSE, CAPILLARY
Glucose-Capillary: 355 mg/dL — ABNORMAL HIGH (ref 70–99)
Glucose-Capillary: 223 mg/dL — ABNORMAL HIGH (ref 70–99)

## 2024-06-06 ENCOUNTER — Encounter: Admitting: Physician Assistant

## 2024-06-07 ENCOUNTER — Encounter: Admitting: Physician Assistant

## 2024-06-07 DIAGNOSIS — E1169 Type 2 diabetes mellitus with other specified complication: Secondary | ICD-10-CM | POA: Diagnosis not present

## 2024-06-07 LAB — GLUCOSE, CAPILLARY
Glucose-Capillary: 339 mg/dL — ABNORMAL HIGH (ref 70–99)
Glucose-Capillary: 249 mg/dL — ABNORMAL HIGH (ref 70–99)

## 2024-06-08 ENCOUNTER — Encounter: Admitting: Physician Assistant

## 2024-06-08 DIAGNOSIS — E1169 Type 2 diabetes mellitus with other specified complication: Secondary | ICD-10-CM | POA: Diagnosis not present

## 2024-06-08 LAB — GLUCOSE, CAPILLARY
Glucose-Capillary: 334 mg/dL — ABNORMAL HIGH (ref 70–99)
Glucose-Capillary: 239 mg/dL — ABNORMAL HIGH (ref 70–99)

## 2024-06-09 ENCOUNTER — Encounter: Admitting: Physician Assistant

## 2024-06-12 ENCOUNTER — Encounter: Admitting: Physician Assistant

## 2024-06-13 ENCOUNTER — Encounter: Admitting: Physician Assistant

## 2024-06-13 DIAGNOSIS — E1169 Type 2 diabetes mellitus with other specified complication: Secondary | ICD-10-CM | POA: Diagnosis not present

## 2024-06-13 LAB — GLUCOSE, CAPILLARY
Glucose-Capillary: 296 mg/dL — ABNORMAL HIGH (ref 70–99)
Glucose-Capillary: 250 mg/dL — ABNORMAL HIGH (ref 70–99)

## 2024-06-14 ENCOUNTER — Encounter: Admitting: Physician Assistant

## 2024-06-15 ENCOUNTER — Encounter: Admitting: Physician Assistant

## 2024-06-16 ENCOUNTER — Encounter: Admitting: Physician Assistant

## 2024-06-16 DIAGNOSIS — E1169 Type 2 diabetes mellitus with other specified complication: Secondary | ICD-10-CM | POA: Diagnosis not present

## 2024-06-19 ENCOUNTER — Encounter: Admitting: Physician Assistant

## 2024-06-20 ENCOUNTER — Encounter: Admitting: Physician Assistant

## 2024-06-21 ENCOUNTER — Encounter: Admitting: Physician Assistant

## 2024-06-22 ENCOUNTER — Encounter: Admitting: Physician Assistant

## 2024-06-23 ENCOUNTER — Encounter: Admitting: Physician Assistant

## 2024-06-23 DIAGNOSIS — E1169 Type 2 diabetes mellitus with other specified complication: Secondary | ICD-10-CM | POA: Diagnosis not present

## 2024-06-29 ENCOUNTER — Ambulatory Visit (INDEPENDENT_AMBULATORY_CARE_PROVIDER_SITE_OTHER): Payer: Self-pay | Admitting: Psychiatry

## 2024-06-29 ENCOUNTER — Other Ambulatory Visit (HOSPITAL_COMMUNITY): Payer: Self-pay

## 2024-06-29 ENCOUNTER — Other Ambulatory Visit: Payer: Self-pay

## 2024-06-29 VITALS — BP 150/96 | Wt 308.0 lb

## 2024-06-29 DIAGNOSIS — F3341 Major depressive disorder, recurrent, in partial remission: Secondary | ICD-10-CM

## 2024-06-29 DIAGNOSIS — F331 Major depressive disorder, recurrent, moderate: Secondary | ICD-10-CM | POA: Diagnosis not present

## 2024-06-29 DIAGNOSIS — F900 Attention-deficit hyperactivity disorder, predominantly inattentive type: Secondary | ICD-10-CM | POA: Diagnosis not present

## 2024-06-29 MED ORDER — HYDROXYZINE HCL 10 MG PO TABS
10.0000 mg | ORAL_TABLET | Freq: Three times a day (TID) | ORAL | 0 refills | Status: AC | PRN
  Filled 2024-06-29 (×2): qty 90, 30d supply, fill #0

## 2024-06-29 MED ORDER — ATOMOXETINE HCL 40 MG PO CAPS
40.0000 mg | ORAL_CAPSULE | Freq: Every day | ORAL | 1 refills | Status: AC
  Filled 2024-06-29 (×2): qty 30, 30d supply, fill #0

## 2024-06-29 MED ORDER — SERTRALINE HCL 50 MG PO TABS
50.0000 mg | ORAL_TABLET | Freq: Every day | ORAL | 1 refills | Status: AC
  Filled 2024-06-29 (×2): qty 30, 30d supply, fill #0

## 2024-06-30 ENCOUNTER — Encounter: Admitting: Physician Assistant

## 2024-07-27 ENCOUNTER — Encounter (HOSPITAL_COMMUNITY): Admitting: Psychiatry

## 2024-08-08 ENCOUNTER — Ambulatory Visit: Admitting: Internal Medicine

## 2024-08-22 ENCOUNTER — Ambulatory Visit (HOSPITAL_COMMUNITY)
